# Patient Record
Sex: Male | Born: 1945 | Race: White | Hispanic: No | Marital: Married | Smoking: Former smoker
Health system: Southern US, Community
[De-identification: ages and names within clinical notes are randomized; demographics above are authoritative.]

## PROBLEM LIST (undated history)

## (undated) DIAGNOSIS — I255 Ischemic cardiomyopathy: Secondary | ICD-10-CM

## (undated) DIAGNOSIS — B399 Histoplasmosis, unspecified: Secondary | ICD-10-CM

## (undated) DIAGNOSIS — I350 Nonrheumatic aortic (valve) stenosis: Secondary | ICD-10-CM

## (undated) DIAGNOSIS — I2510 Atherosclerotic heart disease of native coronary artery without angina pectoris: Secondary | ICD-10-CM

## (undated) DIAGNOSIS — I509 Heart failure, unspecified: Secondary | ICD-10-CM

## (undated) DIAGNOSIS — G4733 Obstructive sleep apnea (adult) (pediatric): Secondary | ICD-10-CM

## (undated) DIAGNOSIS — I10 Essential (primary) hypertension: Secondary | ICD-10-CM

## (undated) DIAGNOSIS — E119 Type 2 diabetes mellitus without complications: Secondary | ICD-10-CM

## (undated) DIAGNOSIS — T7840XA Allergy, unspecified, initial encounter: Secondary | ICD-10-CM

## (undated) DIAGNOSIS — I219 Acute myocardial infarction, unspecified: Secondary | ICD-10-CM

## (undated) DIAGNOSIS — I77810 Thoracic aortic ectasia: Secondary | ICD-10-CM

## (undated) DIAGNOSIS — I214 Non-ST elevation (NSTEMI) myocardial infarction: Secondary | ICD-10-CM

## (undated) DIAGNOSIS — K7460 Unspecified cirrhosis of liver: Secondary | ICD-10-CM

## (undated) DIAGNOSIS — N186 End stage renal disease: Secondary | ICD-10-CM

## (undated) DIAGNOSIS — N400 Enlarged prostate without lower urinary tract symptoms: Secondary | ICD-10-CM

## (undated) DIAGNOSIS — I480 Paroxysmal atrial fibrillation: Secondary | ICD-10-CM

## (undated) DIAGNOSIS — H409 Unspecified glaucoma: Secondary | ICD-10-CM

## (undated) DIAGNOSIS — Z8249 Family history of ischemic heart disease and other diseases of the circulatory system: Secondary | ICD-10-CM

## (undated) DIAGNOSIS — N189 Chronic kidney disease, unspecified: Secondary | ICD-10-CM

## (undated) DIAGNOSIS — D126 Benign neoplasm of colon, unspecified: Secondary | ICD-10-CM

## (undated) DIAGNOSIS — I453 Trifascicular block: Secondary | ICD-10-CM

## (undated) DIAGNOSIS — M109 Gout, unspecified: Secondary | ICD-10-CM

## (undated) HISTORY — DX: Non-ST elevation (NSTEMI) myocardial infarction: I21.4

## (undated) HISTORY — DX: Chronic kidney disease, unspecified: N18.9

## (undated) HISTORY — DX: Unspecified cirrhosis of liver: K74.60

## (undated) HISTORY — DX: Benign neoplasm of colon, unspecified: D12.6

## (undated) HISTORY — DX: Type 2 diabetes mellitus without complications: E11.9

## (undated) HISTORY — DX: Benign prostatic hyperplasia without lower urinary tract symptoms: N40.0

## (undated) HISTORY — PX: COLONOSCOPY: SHX174

## (undated) HISTORY — DX: Unspecified glaucoma: H40.9

## (undated) HISTORY — DX: Allergy, unspecified, initial encounter: T78.40XA

## (undated) HISTORY — PX: CORONARY ANGIOPLASTY WITH STENT PLACEMENT: SHX49

## (undated) HISTORY — DX: Heart failure, unspecified: I50.9

## (undated) HISTORY — PX: APPENDECTOMY: SHX54

---
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
## 2005-06-26 ENCOUNTER — Ambulatory Visit: Payer: Self-pay | Admitting: Gastroenterology

## 2005-07-26 ENCOUNTER — Ambulatory Visit: Payer: Self-pay | Admitting: Gastroenterology

## 2005-07-26 ENCOUNTER — Encounter (INDEPENDENT_AMBULATORY_CARE_PROVIDER_SITE_OTHER): Payer: Self-pay | Admitting: Specialist

## 2008-06-27 ENCOUNTER — Ambulatory Visit: Payer: Self-pay | Admitting: Surgery

## 2008-07-14 ENCOUNTER — Ambulatory Visit (HOSPITAL_COMMUNITY): Admission: RE | Admit: 2008-07-14 | Discharge: 2008-07-14 | Payer: Self-pay | Admitting: Surgery

## 2008-07-14 ENCOUNTER — Ambulatory Visit: Payer: Self-pay | Admitting: Surgery

## 2008-07-25 ENCOUNTER — Ambulatory Visit: Payer: Self-pay | Admitting: Surgery

## 2008-08-05 ENCOUNTER — Ambulatory Visit (HOSPITAL_COMMUNITY): Admission: RE | Admit: 2008-08-05 | Discharge: 2008-08-05 | Payer: Self-pay | Admitting: Surgery

## 2008-09-19 ENCOUNTER — Ambulatory Visit: Payer: Self-pay | Admitting: Surgery

## 2008-10-21 ENCOUNTER — Encounter: Admission: RE | Admit: 2008-10-21 | Discharge: 2008-10-21 | Payer: Self-pay | Admitting: Internal Medicine

## 2008-10-27 ENCOUNTER — Telehealth (INDEPENDENT_AMBULATORY_CARE_PROVIDER_SITE_OTHER): Payer: Self-pay | Admitting: Radiology

## 2008-10-31 ENCOUNTER — Encounter: Payer: Self-pay | Admitting: Cardiology

## 2008-10-31 ENCOUNTER — Encounter: Payer: Self-pay | Admitting: Cardiovascular Disease

## 2008-10-31 ENCOUNTER — Ambulatory Visit: Payer: Self-pay

## 2009-02-15 ENCOUNTER — Ambulatory Visit (HOSPITAL_COMMUNITY): Admission: RE | Admit: 2009-02-15 | Discharge: 2009-02-15 | Payer: Self-pay | Admitting: Internal Medicine

## 2009-02-23 ENCOUNTER — Encounter (HOSPITAL_COMMUNITY): Admission: RE | Admit: 2009-02-23 | Discharge: 2009-05-24 | Payer: Self-pay | Admitting: Nephrology

## 2009-03-14 ENCOUNTER — Ambulatory Visit: Payer: Self-pay | Admitting: Cardiology

## 2009-03-14 ENCOUNTER — Ambulatory Visit (HOSPITAL_COMMUNITY): Admission: RE | Admit: 2009-03-14 | Discharge: 2009-03-15 | Payer: Self-pay | Admitting: General Surgery

## 2009-04-08 HISTORY — PX: PERITONEAL CATHETER INSERTION: SHX2223

## 2009-04-08 HISTORY — PX: AV FISTULA PLACEMENT: SHX1204

## 2009-04-13 ENCOUNTER — Emergency Department (HOSPITAL_COMMUNITY)
Admission: EM | Admit: 2009-04-13 | Discharge: 2009-04-13 | Payer: Self-pay | Source: Home / Self Care | Admitting: Emergency Medicine

## 2009-05-11 ENCOUNTER — Encounter: Admission: RE | Admit: 2009-05-11 | Discharge: 2009-05-11 | Payer: Self-pay | Admitting: Nephrology

## 2009-06-09 ENCOUNTER — Encounter: Admission: RE | Admit: 2009-06-09 | Discharge: 2009-06-09 | Payer: Self-pay | Admitting: Nephrology

## 2010-02-08 ENCOUNTER — Telehealth: Payer: Self-pay | Admitting: Cardiovascular Disease

## 2010-05-08 NOTE — Consult Note (Signed)
Summary: [facility]  [place]   Imported By: Marilynne Drivers 05/22/2009 16:04:44  _____________________________________________________________________  External Attachment:    Type:   Image     Comment:   External Document

## 2010-05-08 NOTE — Progress Notes (Signed)
Summary: Renal transplant clearance  Phone Note Other Incoming   Caller: Lannette Donath w/ [facility] Summary of Call: I had a message left on my voice mail today from [place] with [facility]. stating that they needed [place] to give a letter of clearance for Mr. Story to go on the renal transplant list. I called Lannette Donath and explained that he is technically not a cardiology pt. He was referred her by Dr. Melford Aase for a myoview in 7/10. Lannette Donath states she will make the pt aware. Initial call taken by: Alvis Lemmings, RN, BSN,  February 08, 2010 4:14 PM     Appended Document: Renal transplant clearance Priscilla's contact # is [phone].

## 2010-06-24 LAB — POCT I-STAT, CHEM 8
BUN: >140 mg/dL — ABNORMAL HIGH (ref 6–23)
Glucose, Bld: 46 mg/dL — ABNORMAL LOW (ref 70–99)
Potassium: 4.3 mEq/L (ref 3.5–5.1)
Sodium: 138 mEq/L (ref 135–145)
TCO2: 25 mmol/L (ref 0–100)

## 2010-07-09 LAB — FERRITIN: Ferritin: 179 ng/mL (ref 22–322)

## 2010-07-09 LAB — POCT HEMOGLOBIN-HEMACUE
Hemoglobin: 10.2 g/dL — ABNORMAL LOW (ref 13.0–17.0)
Hemoglobin: 11.8 g/dL — ABNORMAL LOW (ref 13.0–17.0)

## 2010-07-09 LAB — IRON AND TIBC
Iron: 54 ug/dL (ref 42–135)
TIBC: 388 ug/dL (ref 215–435)

## 2010-07-10 LAB — GLUCOSE, CAPILLARY: Glucose-Capillary: 135 mg/dL — ABNORMAL HIGH (ref 70–99)

## 2010-07-10 LAB — BASIC METABOLIC PANEL
BUN: 119 mg/dL — ABNORMAL HIGH (ref 6–23)
CO2: 20 mEq/L (ref 19–32)
CO2: 23 mEq/L (ref 19–32)
Calcium: 9.3 mg/dL (ref 8.4–10.5)
Chloride: 110 mEq/L (ref 96–112)
GFR calc Af Amer: 10 mL/min — ABNORMAL LOW (ref >60)
GFR calc non Af Amer: 8 mL/min — ABNORMAL LOW (ref >60)
Glucose, Bld: 183 mg/dL — ABNORMAL HIGH (ref 70–99)
Potassium: 4.4 mEq/L (ref 3.5–5.1)
Sodium: 139 mEq/L (ref 135–145)

## 2010-07-10 LAB — POCT I-STAT 4, (NA,K, GLUC, HGB,HCT)
Glucose, Bld: 110 mg/dL — ABNORMAL HIGH (ref 70–99)
Hemoglobin: 10.9 g/dL — ABNORMAL LOW (ref 13.0–17.0)

## 2010-07-10 LAB — DIFFERENTIAL
Eosinophils Relative: 2 % (ref 0–5)
Lymphocytes Relative: 15 % (ref 12–46)
Lymphs Abs: 0.9 10*3/uL (ref 0.7–4.0)
Monocytes Absolute: 0.4 10*3/uL (ref 0.1–1.0)
Neutrophils Relative %: 75 % (ref 43–77)

## 2010-07-10 LAB — CBC
HCT: 31.8 % — ABNORMAL LOW (ref 39.0–52.0)
MCHC: 33.8 g/dL (ref 30.0–36.0)
RBC: 3.25 MIL/uL — ABNORMAL LOW (ref 4.22–5.81)
WBC: 6.2 10*3/uL (ref 4.0–10.5)

## 2010-07-10 LAB — URINE MICROSCOPIC-ADD ON

## 2010-07-10 LAB — URINALYSIS, ROUTINE W REFLEX MICROSCOPIC
Bilirubin Urine: NEGATIVE
Hgb urine dipstick: NEGATIVE
Leukocytes, UA: NEGATIVE
Protein, ur: 100 mg/dL — AB

## 2010-07-11 LAB — POCT HEMOGLOBIN-HEMACUE: Hemoglobin: 9.2 g/dL — ABNORMAL LOW (ref 13.0–17.0)

## 2010-07-18 LAB — POCT I-STAT 4, (NA,K, GLUC, HGB,HCT)
Glucose, Bld: 139 mg/dL — ABNORMAL HIGH (ref 70–99)
Glucose, Bld: 177 mg/dL — ABNORMAL HIGH (ref 70–99)
HCT: 39 % (ref 39.0–52.0)
Hemoglobin: 13.3 g/dL (ref 13.0–17.0)
Potassium: 3.8 mEq/L (ref 3.5–5.1)
Sodium: 140 mEq/L (ref 135–145)

## 2010-07-18 LAB — GLUCOSE, CAPILLARY: Glucose-Capillary: 166 mg/dL — ABNORMAL HIGH (ref 70–99)

## 2010-08-21 NOTE — Assessment & Plan Note (Signed)
OFFICE VISIT   Robert Arnold, Robert Arnold  DOB:  10-02-1945                                       06/27/2008  [id number]   REASON FOR VISIT:  Dialysis access.   HISTORY:  This is a 65 year old gentleman with chronic kidney disease,  not yet on dialysis.  He is sent to me by Dr. Florene Glen.  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  He is right-  handed.   PAST MEDICAL HISTORY:  Significant for chronic kidney disease, diabetes,  hypertension, hypercholesterolemia.   REVIEW OF SYSTEMS:  GENERAL:  Negative for fevers, chills, weight gain,  weight loss.  CARDIAC:  Positive for shortness of breath with exertion.  PULMONARY:  Negative.  GI:  Negative.  GU:  Positive for kidney disease.  VASCULAR:  Negative.  NEURO:  Negative.  ORTHO:  Negative.  PSYCH:  Positive for depression.  ENT:  Negative.  HEME:  Negative.   FAMILY HISTORY:  Noncontributory.   SOCIAL HISTORY:  Married.  Works as a Tree surgeon.  Does not smoke.  Has a history of smoking but quit in 1992.   MEDICATIONS:  Allopurinol, Januvia, Crestor, Paxil, vitamin, Vitamin D,  Fish Oil, Lasix.   ALLERGIES:  None.   PHYSICAL EXAMINATION:  Vital Signs:  Blood pressure 159/106, pulse is  80.  General:  Well appearing, no distress.  Cardiovascular:  Regular  rate and rhythm.  Pulmonary:  Respirations are nonlabored.  Extremities:  Warm and well-perfused.  He has a palpable left radial pulse.  Cephalic  vein is visible.   DIAGNOSTIC STUDIES:  Vein mapping was performed today.  The patient has  adequate cephalic vein bilaterally, on the left ranging from 0.32 up to  0.61.   ASSESSMENT/PLAN:  Chronic kidney disease in a right-handed gentleman not  yet on dialysis.   PLAN:  The patient will be scheduled for a left wrist fistula.  The  risks and benefits, including non-maturity and steal syndrome, were  discussed with the patient.  His operation has been scheduled for  Thursday, July 14, 2008.   Eldridge Abrahams, MD  Electronically Signed   VWB/MEDQ  D:  06/27/2008  T:  06/28/2008  Job:  [id number]   cc:   Darrold Span. Florene Glen, M.D.

## 2010-08-21 NOTE — Procedures (Signed)
CEPHALIC VEIN MAPPING   INDICATION:  Cephalic vein mapping for dialysis access sites.   HISTORY:  End-stage renal disease.   EXAM:   The right cephalic vein is compressible.    Diameter measurements range from 0.87 to 0.30 cm.   The left cephalic vein is compressible.   Diameter measurements range from 0.46 to 0.32 cm.   See attached worksheet for all measurements.   IMPRESSION:  Patent bilateral cephalic veins which are of acceptable  diameter for use as a dialysis access site.   ___________________________________________  V. Leia Alf, MD   AC/MEDQ  D:  06/27/2008  T:  06/27/2008  Job:  [id number]

## 2010-08-21 NOTE — Op Note (Signed)
NAMEJIMM, SALK               ACCOUNT NO.:  [id number]   MEDICAL RECORD NO.:  [id number]          PATIENT TYPE:  AMB   LOCATION:  SDS                          FACILITY:  [facility]   PHYSICIAN:  Nelda Severe. Kellie Simmering, M.D.  DATE OF BIRTH:  25-Feb-1946   DATE OF PROCEDURE:  08/05/2008  DATE OF DISCHARGE:  08/05/2008                               OPERATIVE REPORT   PREOPERATIVE DIAGNOSIS:  End-stage renal disease with competing branch  of left forearm (Cimino) arteriovenous fistula.   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   OPERATIONS:  Ligation of competing branch of left forearm arteriovenous  fistula.   SURGEON:  Nelda Severe. Kellie Simmering, MD   FIRST ASSISTANT:  Nurse.   ANESTHESIA:  Local.   PROCEDURE:  The patient was taken to the operating room and placed in  the supine position at which time the left upper extremity was prepped  with Betadine scrub and solution and draped in a routine sterile manner.  Using ultrasound guidance, the left forearm fistula was examined and the  large cephalic vein had a communicating branch with the basilic system  about 4-5 cm distal to the antecubital area.  A short longitudinal  incision was made in this area after infiltration of 1% Xylocaine.  The  large branch was identified and after occluding the branch temporarily  it did improve flow slightly up the cephalic system into the upper arm.  This was ligated with 2-0 silk tie.  Further examination with ultrasound  revealed no further significant branches and the wound was closed in  layers with Vicryl in a subcuticular fashion.  Sterile dressing was  applied.  The patient was taken to the recovery room in satisfactory  condition.      Nelda Severe Kellie Simmering, M.D.  Electronically Signed     JDL/MEDQ  D:  08/05/2008  T:  08/05/2008  Job:  [id number]

## 2010-08-21 NOTE — Assessment & Plan Note (Signed)
OFFICE VISIT   Robert Arnold, Robert Arnold  DOB:  December 22, 1945                                       09/19/2008  [id number]   REASON FOR VISIT:  Follow up fistula.   HISTORY:  This is a 65 year old gentleman who underwent left  radiocephalic fistula on July 14, 2008.  When I saw him in follow-up, I  felt that he had some competing branches which were not allowing this  fistula to mature.  On August 05, 2008, he underwent branch ligation.  He  comes back today for follow-up.  On examination, he has an excellent  thrill in an easily palpable vein which I feel will serve as an adequate  conduit for dialysis should the need arise.  I am not going to schedule  him to come back to see me.  If there are any questions, I will ask the  renal service to contact me directly.   Eldridge Abrahams, MD  Electronically Signed   VWB/MEDQ  D:  09/18/2008  T:  09/20/2008  Job:  [id number]   cc:   Darrold Span. Florene Glen, M.D.

## 2010-08-21 NOTE — Procedures (Signed)
DIALYSIS GRAFT DUPLEX EVALUATION   INDICATION:  Recently placed left arm cephalic vein fistula which has  failed to mature in the last week and a half.   HISTORY:   DUPLEX:  See impression.                                   Duplex Velocities  Inflow artery                   140 cm/s  Inflow anastomosis              207 cm/s  Mid arterial limb  Mid graft  Mid venous limb  Outflow anastomosis             134 cm/s  Outflow vein                    105 cm/s   IMPRESSION:  The cephalic vein branches approximately 5 cm distal to the  antecubital fossa.  The main cephalic vein courses anteriorly, the  secondary branch courses medially and communicates with the basilic  vein.   ___________________________________________  V. Leia Alf, MD   MC/MEDQ  D:  07/25/2008  T:  07/25/2008  Job:  [id number]

## 2010-08-21 NOTE — Op Note (Signed)
NAMEGIONI, Robert Arnold               ACCOUNT NO.:  [id number]   MEDICAL RECORD NO.:  [id number]          PATIENT TYPE:  AMB   LOCATION:  SDS                          FACILITY:  [facility]   PHYSICIAN:  Theotis Burrow IV, MDDATE OF BIRTH:  1945/12/28   DATE OF PROCEDURE:  07/14/2008  DATE OF DISCHARGE:  07/14/2008                               OPERATIVE REPORT   PREOPERATIVE DIAGNOSIS:  Chronic kidney disease.   POSTOPERATIVE DIAGNOSIS:  Chronic kidney disease.   PROCEDURE PERFORMED:  Left radiocephalic fistula.   SURGEON:  1. Leia Alf, MD   ASSISTANT:  Jacinta Shoe, PA   ANESTHESIA:  General with LMA.   BLOOD LOSS:  Minimal.   FINDINGS:  Excellent vein.   INDICATIONS:  This is a 66 year old gentleman with chronic kidney  disease, not yet on dialysis.  He is right handed.  Vein mapping  revealed adequate cephalic vein in the left arm.  He comes in today for  left wrist fistula.   PROCEDURE:  The patient was identified in the holding area and taken to  room 8.  He was placed supine on the table.  General anesthesia was  administered, and the patient was prepped and draped in standard sterile  fashion.  A time-out was called.  Antibiotics were given.  Ultrasound  was used to find the course of the cephalic vein in the left arm.  It  was marked with an ink pen.  A transverse incision was made 2  fingerbreadths proximal to the radial head.  The vein was identified  within the incision and encircled with a vessel loop.  The cephalic vein  and the branch at this level.  The remainder of cephalic vein was  further dissected down towards the hand.  The branch was ligated with 2-  0 silk tie and clipped.  Once adequate length of the vein had been  dissected free, it was marked with ink pen for orientation.  Next, the  radial artery was identified.  Once it was identified, it was encircled  with a vessel loop and then mobilized proximally and distally.  The  radial artery  was approximately 2 mm in size.  Next, a right angle was  placed on the distal cephalic vein.  The vein was transected and the  distal end ligated with a 2-0 silk tie.  The vein was then flushed with  heparinized saline and easily distended.  It measured approximately 3.5  mm.  Next, the patient was given 3000 units of heparin.  After the  heparin had circulated, the radial artery was occluded with vascular  clamps.  The vein was then brought over towards the artery.  It had to  be shortened.  The end was then spatulated, and an end-to-side  anastomosis was created using a running 6-0 Prolene.  Prior to  completion of the anastomosis, the artery was flushed in antegrade and  retrograde fashion.  The anastomosis was then completed and the clamps  were released.  The fistula could be felt up to the level of the elbow.  There is a  good thrill.  I then inspected the vein as it coursed up the  arm through the incision.  There was no tension or narrowing from  compression of the subcutaneous  tissue on the vein.  The wound was then irrigated.  The deep tissue was  closed with a 3-0 Vicryl.  The skin was closed with 4-0 Vicryl.  Dermabond was placed.  The patient tolerated the procedure well.  There  were no complications.  He was successfully awakened from anesthesia and  taken to recovery room in stable condition.       Eldridge Abrahams, MD  Electronically Signed     VWB/MEDQ  D:  07/14/2008  T:  07/15/2008  Job:  [id number]

## 2010-08-21 NOTE — Assessment & Plan Note (Signed)
OFFICE VISIT   Robert Arnold, Robert Arnold  DOB:  09-Oct-1945                                       07/25/2008  [id number]   REASON FOR VISIT:  Follow-up left wrist fistula.   HISTORY:  This is a 65 year old gentleman with chronic kidney disease,  not yet on dialysis, who underwent a left radiocephalic fistula on  [id number].  He comes back in today for follow-up.   On examination, he has an easily palpable fistula up to the mid forearm  where there is a caliper change.  I had this evaluated with ultrasound  and it is about this point in time he has a large branch.  In fact,  there appeared to be two branches.  These appear to be significant and,  therefore, I think he would benefit from ligation of the branch.   The patient will be scheduled for ligation of his branch.  I will plan  on ligating the branch that appears to communicate with the basilic  system.  These branches occur approximately 5 cm distal to the  antecubital crease.  In the operating room, I will map the course of the  cephalic vein from proximal to distal to ensure that the appropriate  branch is marked and ligated.   Eldridge Abrahams, MD  Electronically Signed   VWB/MEDQ  D:  07/25/2008  T:  07/26/2008  Job:  [id number]   cc:   Darrold Span. Florene Glen, M.D.

## 2010-11-07 ENCOUNTER — Other Ambulatory Visit: Payer: Self-pay | Admitting: *Deleted

## 2010-11-07 DIAGNOSIS — Q619 Cystic kidney disease, unspecified: Secondary | ICD-10-CM

## 2010-11-12 ENCOUNTER — Other Ambulatory Visit: Payer: Self-pay

## 2010-11-29 ENCOUNTER — Encounter: Payer: Self-pay | Admitting: Gastroenterology

## 2010-12-03 ENCOUNTER — Other Ambulatory Visit: Payer: Self-pay | Admitting: *Deleted

## 2010-12-03 DIAGNOSIS — N281 Cyst of kidney, acquired: Secondary | ICD-10-CM

## 2010-12-11 ENCOUNTER — Ambulatory Visit
Admission: RE | Admit: 2010-12-11 | Discharge: 2010-12-11 | Disposition: A | Discharge status: Home / Self Care | Payer: BC Managed Care – PPO | Source: Ambulatory Visit | Attending: *Deleted | Admitting: *Deleted

## 2010-12-11 DIAGNOSIS — N281 Cyst of kidney, acquired: Secondary | ICD-10-CM

## 2010-12-11 MED ORDER — IOHEXOL 300 MG/ML  SOLN
100.0000 mL | Freq: Once | INTRAMUSCULAR | Status: AC | PRN
  Administered 2010-12-11: 100 mL via INTRAVENOUS

## 2010-12-19 ENCOUNTER — Ambulatory Visit (AMBULATORY_SURGERY_CENTER): Payer: BC Managed Care – PPO | Admitting: *Deleted

## 2010-12-19 VITALS — Ht 68.0 in | Wt 219.0 lb

## 2010-12-19 DIAGNOSIS — Z1211 Encounter for screening for malignant neoplasm of colon: Secondary | ICD-10-CM

## 2010-12-19 MED ORDER — PEG-KCL-NACL-NASULF-NA ASC-C 100 G PO SOLR: ORAL | Status: DC

## 2010-12-19 NOTE — Progress Notes (Signed)
Has left fore arm AV fistula and Peritoneal fistula  On dialysis

## 2011-01-01 ENCOUNTER — Ambulatory Visit (AMBULATORY_SURGERY_CENTER): Payer: Medicare Other | Admitting: Gastroenterology

## 2011-01-01 ENCOUNTER — Encounter: Payer: Self-pay | Admitting: Gastroenterology

## 2011-01-01 VITALS — BP 150/78 | HR 65 | Temp 97.8°F | Resp 14 | Ht 68.0 in | Wt 219.0 lb

## 2011-01-01 DIAGNOSIS — D126 Benign neoplasm of colon, unspecified: Secondary | ICD-10-CM

## 2011-01-01 DIAGNOSIS — Z1211 Encounter for screening for malignant neoplasm of colon: Secondary | ICD-10-CM

## 2011-01-01 DIAGNOSIS — K5730 Diverticulosis of large intestine without perforation or abscess without bleeding: Secondary | ICD-10-CM

## 2011-01-01 LAB — GLUCOSE, CAPILLARY: Glucose-Capillary: 84 mg/dL (ref 70–99)

## 2011-01-01 MED ORDER — SODIUM CHLORIDE 0.9 % IV SOLN: 500.0000 mL | INTRAVENOUS | Status: DC

## 2011-01-01 NOTE — Patient Instructions (Addendum)
Diverticulosis Diverticulosis is a common condition that develops when small pouches (diverticula) form in the wall of the colon. The risk of diverticulosis increases with age. It happens more often in people who eat a low-fiber diet. Most individuals with diverticulosis have no symptoms. Those individuals with symptoms usually experience belly (abdominal) pain, constipation, or loose stools (diarrhea). HOME CARE INSTRUCTIONS  Increase the amount of fiber in your diet as directed by your caregiver or dietician. This may reduce symptoms of diverticulosis.   Your caregiver may recommend taking a dietary fiber supplement.   Drink at least 6 to 8 glasses of water each day to prevent constipation.   Try not to strain when you have a bowel movement.   Your caregiver may recommend avoiding nuts and seeds to prevent complications, although this is still an uncertain benefit.   Only take over-the-counter or prescription medicines for pain, discomfort, or fever as directed by your caregiver.  FOODS HAVING HIGH FIBER CONTENT INCLUDE:  Fruits. Apple, peach, pear, tangerine, raisins, prunes.   Vegetables. Brussels sprouts, asparagus, broccoli, cabbage, carrot, cauliflower, romaine lettuce, spinach, summer squash, tomato, winter squash, zucchini.   Starchy Vegetables. Baked beans, kidney beans, lima beans, split peas, lentils, potatoes (with skin).   Grains. Whole wheat bread, brown rice, bran flake cereal, plain oatmeal, white rice, shredded wheat, bran muffins.  SEEK IMMEDIATE MEDICAL CARE IF:  You develop increasing pain or severe bloating.  You have an oral temperature above 100  Polyps, Colon  A polyp is extra tissue that grows inside your body. Colon polyps grow in the large intestine. The large intestine, also called the colon, is part of your digestive system. It is a long, hollow tube at the end of your digestive tract where your body makes and stores stool. Most polyps are not dangerous.  They are benign. This means they are not cancerous. But over time, some types of polyps can turn into cancer. Polyps that are smaller than a pea are usually not harmful. But larger polyps could someday become or may already be cancerous. To be safe, doctors remove all polyps and test them.  WHO GETS POLYPS? Anyone can get polyps, but certain people are more likely than others. You may have a greater chance of getting polyps if: You are over 50.  You have had polyps before.  Someone in your family has had polyps.  Someone in your family has had cancer of the large intestine.  Find out if someone in your family has had polyps. You may also be more likely to get polyps if you:  Eat a lot of fatty foods  Smoke  Drink alcohol  Do not exercise Eat too much  SYMPTOMS Most small polyps do not cause symptoms. People often do not know they have one until their caregiver finds it during a regular checkup or while testing them for something else. Some people do have symptoms like these: Bleeding from the anus. You might notice blood on your underwear or on toilet paper after you have had a bowel movement.  Constipation or diarrhea that lasts more than a week.  Blood in the stool. Blood can make stool look black or it can show up as red streaks in the stool.  If you have any of these symptoms, see your caregiver. HOW DOES THE DOCTOR TEST FOR POLYPS? The doctor can use four tests to check for polyps: Digital rectal exam. The caregiver wears gloves and checks your rectum (the last part of the large intestine)  to see if it feels normal. This test would find polyps only in the rectum. Your caregiver may need to do one of the other tests listed below to find polyps higher up in the intestine.  Barium enema. The caregiver puts a liquid called barium into your rectum before taking x-rays of your large intestine. Barium makes your intestine look white in the pictures. Polyps are dark, so they are easy to see.    Sigmoidoscopy. With this test, the caregiver can see inside your large intestine. A thin flexible tube is placed into your rectum. The device is called a sigmoidoscope, which has a light and a tiny video camera in it. The caregiver uses the sigmoidoscope to look at the last third of your large intestine.  Colonoscopy. This test is like sigmoidoscopy, but the caregiver looks at all of the large intestine. It usually requires sedation. This is the most common method for finding and removing polyps.  TREATMENT The caregiver will remove the polyp during sigmoidoscopy or colonoscopy. The polyp is then tested for cancer.  If you have had polyps, your caregiver may want you to get tested regularly in the future.  PREVENTION There is not one sure way to prevent polyps. You might be able to lower your risk of getting them if you: Eat more fruits and vegetables and less fatty food.  Do not smoke.  Avoid alcohol.  Exercise every day.  Lose weight if you are overweight.  Eating more calcium and folate can also lower your risk of getting polyps. Some foods that are rich in calcium are milk, cheese, and broccoli. Some foods that are rich in folate are chickpeas, kidney beans, and spinach.  Aspirin might help prevent polyps. Studies are under way.  Document Released: 12/20/2003 Document Re-Released: 09/12/2009  [facility] Patient Information 2011 [place]., not controlled by medicine.   You develop vomiting or bowel movements that are bloody or black.  Document Released: 12/21/2003 Document Re-Released: 09/12/2009 [facility] Patient Information 2011 [place].  Please review your discharge instructions (blue and green sheets)  Resume your normal medications  Review information about polyps, diverticulosis and high fiber diets  Your blood sugar in the recovery room was 134

## 2011-01-01 NOTE — Progress Notes (Signed)
Pt tolerated the colonoscopy very well. maw 

## 2011-01-02 ENCOUNTER — Telehealth: Payer: Self-pay | Admitting: *Deleted

## 2011-01-02 NOTE — Telephone Encounter (Signed)
No answer. Message left to call if needed.

## 2011-04-09 HISTORY — PX: CORONARY ARTERY BYPASS GRAFT: SHX141

## 2011-07-09 ENCOUNTER — Ambulatory Visit (HOSPITAL_BASED_OUTPATIENT_CLINIC_OR_DEPARTMENT_OTHER): Payer: BC Managed Care – PPO | Attending: Internal Medicine

## 2011-07-09 VITALS — Ht 68.0 in | Wt 204.0 lb

## 2011-07-09 DIAGNOSIS — G4733 Obstructive sleep apnea (adult) (pediatric): Secondary | ICD-10-CM | POA: Insufficient documentation

## 2011-07-20 DIAGNOSIS — G4733 Obstructive sleep apnea (adult) (pediatric): Secondary | ICD-10-CM

## 2011-07-20 NOTE — Procedures (Signed)
[name], Robert Arnold               ACCOUNT NO.:  [id number]  MEDICAL RECORD NO.:  [id number]          PATIENT TYPE:  OUT  LOCATION:  SLEEP CENTER                 FACILITY:  [facility]  PHYSICIAN:  Jae Skeet D. Annamaria Boots, MD, FCCP, FACPDATE OF BIRTH:  01/[birth year]  DATE OF STUDY:  07/09/2011                           NOCTURNAL POLYSOMNOGRAM  REFERRING PHYSICIAN:  [name], M.D.  REFERRING PHYSICIAN:  [name], MD  INDICATION FOR STUDY:  Insomnia with sleep apnea.  EPWORTH SLEEPINESS SCORE:  20/24.  BMI 31, weight 204 pounds, height 68 inches, neck 17.5 inches.  MEDICATIONS:  Home medications are charted and reviewed.  SLEEP ARCHITECTURE:  Total sleep time 62 minutes, sleep efficiency 16.1%.  Stage I was 25.8%, stage II 74.2%, stage III and REM were absent.  Sleep latency 57 minutes, REM latency NA, awake after sleep onset 254 minutes, arousal index 24.2.  Bedtime medication:  None.  Sleep was recorded between 2300-2345 p.m. and then again from 0300-0430 a.m.  RESPIRATORY DATA:  Apnea-hypopnea index (AHI) 79.4 per hour.  A total of 82 events was scored including 2 obstructive apneas and 80 hypopneas. All events were associated with non-supine sleep position.  There was no REM.  Because of lack of sleep, he did not meet the requirements for CPAP split titration protocol on this study night.  OXYGEN DATA:  Moderately loud snoring with oxygen desaturation to a nadir of 85% and a mean oxygen saturation through the study of 93.6% on room air.  CARDIAC DATA:  Normal sinus rhythm.  MOVEMENT-PARASOMNIA:  No significant movement disturbance.  No bathroom trips.  IMPRESSIONS-RECOMMENDATION: 1. Severe difficulty initiating and maintaining sleep, insomnia, on     the study night.  Pattern was consistent with the patient's home     description.  No bedtime medication was taken. 2. Severe obstructive sleep apnea/hypopnea syndrome, apnea/hypopnea     index 79.4 per hour.   Moderately loud snoring with oxygen     desaturation to a nadir of 85% on room air. 3. Respiratory events were noted during those limited periods of time     when he was asleep.  There was insufficient time to permit     application of split CPAP titration protocol on this study night.     Consider management first for the insomnia problem.  Consider     return with control of the insomnia for dedicated CPAP titration     study if clinically appropriate.     Jaydi Bray D. Annamaria Boots, MD, [facility], [place], [place] Board of Sleep Medicine    CDY/MEDQ  D:  07/20/2011 08:52:29  T:  07/20/2011 09:10:07  Job:  [id number]

## 2011-09-25 HISTORY — PX: KIDNEY TRANSPLANT: SHX239

## 2011-09-26 DIAGNOSIS — I219 Acute myocardial infarction, unspecified: Secondary | ICD-10-CM

## 2011-09-26 HISTORY — DX: Acute myocardial infarction, unspecified: I21.9

## 2011-10-22 ENCOUNTER — Ambulatory Visit: Payer: BC Managed Care – PPO | Admitting: Occupational Therapy

## 2011-10-22 ENCOUNTER — Ambulatory Visit: Payer: BC Managed Care – PPO | Admitting: Physical Therapy

## 2011-10-28 ENCOUNTER — Ambulatory Visit: Payer: BC Managed Care – PPO | Admitting: Physical Therapy

## 2011-10-28 ENCOUNTER — Encounter: Payer: BC Managed Care – PPO | Admitting: Occupational Therapy

## 2011-10-30 ENCOUNTER — Ambulatory Visit: Payer: Medicare Other | Attending: Physical Medicine and Rehabilitation | Admitting: Physical Therapy

## 2011-10-30 DIAGNOSIS — Z5189 Encounter for other specified aftercare: Secondary | ICD-10-CM | POA: Insufficient documentation

## 2011-10-30 DIAGNOSIS — I252 Old myocardial infarction: Secondary | ICD-10-CM | POA: Insufficient documentation

## 2011-10-30 DIAGNOSIS — R5381 Other malaise: Secondary | ICD-10-CM | POA: Insufficient documentation

## 2011-10-30 DIAGNOSIS — R4189 Other symptoms and signs involving cognitive functions and awareness: Secondary | ICD-10-CM | POA: Insufficient documentation

## 2011-10-30 DIAGNOSIS — M6281 Muscle weakness (generalized): Secondary | ICD-10-CM | POA: Insufficient documentation

## 2011-10-30 DIAGNOSIS — R269 Unspecified abnormalities of gait and mobility: Secondary | ICD-10-CM | POA: Insufficient documentation

## 2011-10-30 DIAGNOSIS — R279 Unspecified lack of coordination: Secondary | ICD-10-CM | POA: Insufficient documentation

## 2011-11-06 ENCOUNTER — Ambulatory Visit: Payer: Medicare Other | Admitting: Physical Therapy

## 2011-11-06 ENCOUNTER — Encounter (HOSPITAL_COMMUNITY): Payer: BC Managed Care – PPO

## 2011-11-06 ENCOUNTER — Ambulatory Visit: Payer: Medicare Other | Admitting: Occupational Therapy

## 2011-11-08 ENCOUNTER — Ambulatory Visit: Payer: Medicare Other | Attending: Physical Medicine and Rehabilitation | Admitting: Physical Therapy

## 2011-11-08 ENCOUNTER — Ambulatory Visit: Payer: BC Managed Care – PPO | Admitting: Physical Therapy

## 2011-11-08 ENCOUNTER — Ambulatory Visit: Payer: Medicare Other | Admitting: Occupational Therapy

## 2011-11-08 DIAGNOSIS — R4189 Other symptoms and signs involving cognitive functions and awareness: Secondary | ICD-10-CM | POA: Insufficient documentation

## 2011-11-08 DIAGNOSIS — R269 Unspecified abnormalities of gait and mobility: Secondary | ICD-10-CM | POA: Insufficient documentation

## 2011-11-08 DIAGNOSIS — R5381 Other malaise: Secondary | ICD-10-CM | POA: Insufficient documentation

## 2011-11-08 DIAGNOSIS — R279 Unspecified lack of coordination: Secondary | ICD-10-CM | POA: Insufficient documentation

## 2011-11-08 DIAGNOSIS — I252 Old myocardial infarction: Secondary | ICD-10-CM | POA: Insufficient documentation

## 2011-11-08 DIAGNOSIS — Z5189 Encounter for other specified aftercare: Secondary | ICD-10-CM | POA: Insufficient documentation

## 2011-11-08 DIAGNOSIS — M6281 Muscle weakness (generalized): Secondary | ICD-10-CM | POA: Insufficient documentation

## 2011-11-11 ENCOUNTER — Ambulatory Visit: Payer: BC Managed Care – PPO | Admitting: Physical Therapy

## 2011-11-12 ENCOUNTER — Ambulatory Visit: Payer: Medicare Other | Admitting: Physical Therapy

## 2011-11-12 ENCOUNTER — Ambulatory Visit: Payer: Medicare Other | Admitting: Occupational Therapy

## 2011-11-13 ENCOUNTER — Ambulatory Visit: Payer: BC Managed Care – PPO | Admitting: Physical Therapy

## 2011-11-18 ENCOUNTER — Ambulatory Visit: Payer: Medicare Other | Admitting: Occupational Therapy

## 2011-11-18 ENCOUNTER — Ambulatory Visit: Payer: Medicare Other | Admitting: Physical Therapy

## 2011-11-20 ENCOUNTER — Ambulatory Visit: Payer: Medicare Other | Admitting: *Deleted

## 2011-11-20 ENCOUNTER — Ambulatory Visit: Payer: Medicare Other | Admitting: Physical Therapy

## 2011-11-25 ENCOUNTER — Ambulatory Visit: Payer: Medicare Other | Admitting: Physical Therapy

## 2011-11-25 ENCOUNTER — Ambulatory Visit: Payer: Medicare Other | Admitting: Occupational Therapy

## 2011-11-26 ENCOUNTER — Encounter (HOSPITAL_COMMUNITY): Payer: BC Managed Care – PPO

## 2011-11-27 ENCOUNTER — Ambulatory Visit: Payer: Medicare Other | Admitting: Occupational Therapy

## 2011-11-27 ENCOUNTER — Ambulatory Visit: Payer: Medicare Other | Admitting: Physical Therapy

## 2011-11-27 ENCOUNTER — Ambulatory Visit: Payer: BC Managed Care – PPO | Admitting: Physical Therapy

## 2011-11-29 ENCOUNTER — Encounter (HOSPITAL_COMMUNITY): Payer: Self-pay

## 2011-11-29 ENCOUNTER — Ambulatory Visit (HOSPITAL_COMMUNITY)
Admission: RE | Admit: 2011-11-29 | Discharge: 2011-11-29 | Disposition: A | Discharge status: Home / Self Care | Payer: Medicare Other | Source: Ambulatory Visit | Attending: Internal Medicine | Admitting: Internal Medicine

## 2011-11-29 VITALS — BP 119/75 | HR 73 | Ht 68.0 in | Wt 180.0 lb

## 2011-11-29 DIAGNOSIS — I5022 Chronic systolic (congestive) heart failure: Secondary | ICD-10-CM | POA: Insufficient documentation

## 2011-11-29 DIAGNOSIS — I509 Heart failure, unspecified: Secondary | ICD-10-CM

## 2011-11-29 DIAGNOSIS — I2510 Atherosclerotic heart disease of native coronary artery without angina pectoris: Secondary | ICD-10-CM | POA: Insufficient documentation

## 2011-11-29 DIAGNOSIS — I4891 Unspecified atrial fibrillation: Secondary | ICD-10-CM

## 2011-11-29 HISTORY — DX: Histoplasmosis, unspecified: B39.9

## 2011-11-29 HISTORY — DX: Obstructive sleep apnea (adult) (pediatric): G47.33

## 2011-11-29 HISTORY — DX: Gout, unspecified: M10.9

## 2011-11-29 HISTORY — DX: Paroxysmal atrial fibrillation: I48.0

## 2011-11-29 HISTORY — DX: Atherosclerotic heart disease of native coronary artery without angina pectoris: I25.10

## 2011-11-29 NOTE — Patient Instructions (Addendum)
Your physician has requested that you have an echocardiogram. Echocardiography is a painless test that uses sound waves to create images of your heart. It provides your doctor with information about the size and shape of your heart and how well your heart's chambers and valves are working. This procedure takes approximately one hour. There are no restrictions for this procedure.  IN 1 MONTH  Your physician recommends that you schedule a follow-up appointment in: 1 month

## 2011-11-29 NOTE — Progress Notes (Signed)
Advanced Heart Failure Team History and Physical Note   Primary Physician: Dr. Melford Aase Primary Cardiologist:  None Nephrologist: Dr. 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  Underwent living-related kidney transplant at [facility] on 09/25/2011. Post day #1 had a NSTEMI. Cath revealed multivessel disease (details not available) . Underwent stenting of LAD (he thinks DES). Developed AF and required DC-CV.  Treated with coumadin for about a week and then stopped due to ureteral bleeding which required a stent. Echo showed EF 20% with global HK.  Transferred to rehab for 11 days. D/c'd July 11. Going to Neuro rehab on outpatient basis for balance/stamina issues.   Referred here to establish cardiology care. Feeling stronger. Seems like balance limits him most. Can walk 4 or 5 aisles in the store before stopping. No swelling. No CP, orthopnea or PND.   BP runs low. Yesterday it was 78/40 and meds decreased at [facility].     Review of Systems: [y] = yes, [ ]  = no   General: Weight gain [ ] ; Weight loss [ ] ; Anorexia [ ] ; Fatigue Blue.Reese ]; Fever [ ] ; Chills [ ] ; Weakness [ ]   Cardiac: Chest pain/pressure [ ] ; Resting SOB [ ] ; Exertional SOB [ ] ; Orthopnea [ ] ; Pedal Edema [ ] ; Palpitations [ ] ; Syncope [ ] ; Presyncope [ ] ; Paroxysmal nocturnal dyspnea[ ]   Pulmonary: Cough [ ] ; Wheezing[ ] ; Hemoptysis[ ] ; Sputum [ ] ; Snoring Blue.Reese ]  GI: Vomiting[ ] ; Dysphagia[ ] ; Melena[ ] ; Hematochezia [ ] ; Heartburn[ ] ; Abdominal pain [ ] ; Constipation [ ] ; Diarrhea [ ] ; BRBPR [ ]   GU: Hematuria[ ] ; Dysuria [ ] ; Nocturia[ ]   Vascular: Pain in legs with walking [ ] ; Pain in feet with lying flat [ ] ; Non-healing sores [ ] ; Stroke [ ] ; TIA [ ] ; Slurred speech [ ] ;  Neuro: Headaches[ ] ; Vertigo[ ] ; Seizures[ ] ; Paresthesias[ ] ;Blurred vision [  ]; Diplopia [ ] ; Vision changes [ ]  Imbalance [y] Ortho/Skin: Arthritis [ y]; Joint pain [ y]; Muscle pain [ ] ; Joint swelling [ ] ; Back Pain [ ] ; Rash [ ]   Psych: Depression[ ] ; Anxiety[ ]   Heme: Bleeding problems [ ] ; Clotting disorders [ ] ; Anemia [ ]   Endocrine: Diabetes Blue.Reese ]; Thyroid dysfunction[ ]   Home Medications Prior to Admission medications   Medication Sig Start Date End Date Taking? Authorizing Provider  buPROPion (WELLBUTRIN XL) 300 MG 24 hr tablet Take 300 mg by mouth daily.      Historical Provider, MD  carvedilol (COREG) 3.125 MG tablet  11/28/11   Historical Provider, MD  CITALOPRAM HYDROBROMIDE PO Take 4 mg by mouth daily.      Historical Provider, MD  clopidogrel (PLAVIX) 75 MG tablet  11/08/11   Historical Provider, MD  fenofibrate micronized (LOFIBRA) 134 MG capsule Take 134 mg by mouth daily.      Historical Provider, MD  ferrous gluconate (FERGON) 325 MG tablet Take 325 mg by mouth 2 (two) times daily.      Historical Provider, MD  finasteride (PROSCAR) 5 MG tablet Take 5 mg by mouth every other day.      Historical Provider, MD  FREESTYLE TEST STRIPS test strip  12/10/10   Historical Provider, MD  glyBURIDE (DIABETA) 1.25 MG tablet Take 0.625 mg by mouth 2 (two) times daily with a meal.  Historical Provider, MD  HYDROcodone-acetaminophen (NORCO/VICODIN) 5-325 MG per tablet  09/24/11   Historical Provider, MD  itraconazole (SPORANOX) 100 MG capsule  11/07/11   Historical Provider, MD  LORazepam (ATIVAN) 2 MG tablet  09/06/11   Historical Provider, MD  losartan (COZAAR) 25 MG tablet  10/16/11   Historical Provider, MD  metFORMIN (GLUCOPHAGE) 500 MG tablet  11/08/11   Historical Provider, MD  multivitamin (RENA-VIT) TABS tablet Take 1 tablet by mouth daily.      Historical Provider, MD  MYFORTIC 360 MG TBEC  10/31/11   Historical Provider, MD  nystatin (MYCOSTATIN) 100000 UNIT/ML suspension  10/31/11   Historical Provider, MD  omeprazole (PRILOSEC) 20 MG capsule  09/27/11    Historical Provider, MD  peg 3350 powder (MOVIPREP) 100 G SOLR MOVI PREP take as directed 12/19/10   Inda Castle, MD  PHOSPHA 250 NEUTRAL [phone] MG tablet  10/31/11   Historical Provider, MD  PROGRAF 0.5 MG capsule  11/01/11   Historical Provider, MD  rosuvastatin (CRESTOR) 40 MG tablet Take 20 mg by mouth daily.      Historical Provider, MD  sevelamer (RENVELA) 800 MG tablet Take 2,400 mg by mouth 2 (two) times daily with a meal.      Historical Provider, MD  sitaGLIPtin (JANUVIA) 100 MG tablet Take 50 mg by mouth daily.      Historical Provider, MD  sulfamethoxazole-trimethoprim (BACTRIM,SEPTRA) 400-80 MG per tablet  10/31/11   Historical Provider, MD  Tamsulosin HCl (FLOMAX) 0.4 MG CAPS Take 0.4 mg by mouth at bedtime.      Historical Provider, MD  TRAVATAN Z 0.004 % SOLN ophthalmic solution  10/30/11   Historical Provider, MD  VALCYTE 450 MG tablet  10/31/11   Historical Provider, MD    Past Medical History: Past Medical History  Diagnosis Date  . 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 Iron deficiency anemia, unspecified   . Diabetes mellitus type 2, controlled   . Hyperlipidemia   . BPH (benign prostatic hyperplasia)   . Gout   . CAD (coronary artery disease)     s/p post-op NSTEMI. treated with stent to LAD. at [facility]  . PAF (paroxysmal atrial fibrillation)     s/p DC-CV in 6/13. Off coumadin due to ureteral bleed  . OSA (obstructive sleep apnea)   . 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 HTN (hypertension)     Past Surgical History: Past Surgical History  Procedure Date  . Colonoscopy   . Peritoneal catheter insertion 2011  . Av fistula placement 2011    Left forearm    Family History: Family History  Problem Relation Age of Onset  . Heart attack Father   . Diabetes Mother   . COPD Mother     Social History: History   Social History  . Marital Status: Married    Spouse Name:  N/A    Number of Children: N/A  . Years of Education: N/A   Social History Main Topics  . Smoking status: Former Smoker    Quit date: 11/29/2006  . Smokeless tobacco: Never Used  . Alcohol Use: Yes     1-2 a month  . Drug Use: No  . Sexually Active: Not on file   Other Topics Concern  . Not on file   Social History Narrative  . No narrative on file    Allergies:  No Known Allergies  Objective:    Vital Signs:   Pulse Rate:  [73] 73  (08/23 1004) BP: (119)/(75) 119/75 mmHg (08/23 1004) SpO2:  [99 %] 99 % (08/23 1004) Weight:  [180 lb (81.647 kg)] 180 lb (81.647 kg) (08/23 1004)   Filed Weights   11/29/11 1004  Weight: 180 lb (81.647 kg)    Physical Exam: General:  Well appearing. No resp difficulty HEENT: normal Neck: supple. JVP . Carotids 2+ bilat; no bruits. No lymphadenopathy or thryomegaly appreciated. Cor: PMI nondisplaced. Regular rate & rhythm. No rubs, gallops or murmurs. Lungs: clear Abdomen: soft, nontender, nondistended. No hepatosplenomegaly. No bruits or masses. Good bowel sounds. Extremities: no cyanosis, clubbing, rash, edema Neuro: alert & orientedx3, cranial nerves grossly intact. moves all 4 extremities w/o difficulty. Affect pleasant  ECG: SR 71 with 1AVB (224ms) Mild anterolateral ST depression.   Assessment/Plan:

## 2011-11-30 DIAGNOSIS — I5042 Chronic combined systolic (congestive) and diastolic (congestive) heart failure: Secondary | ICD-10-CM | POA: Insufficient documentation

## 2011-11-30 DIAGNOSIS — I5022 Chronic systolic (congestive) heart failure: Secondary | ICD-10-CM | POA: Insufficient documentation

## 2011-11-30 NOTE — Assessment & Plan Note (Signed)
Appears to be post-op AF. Now back in SR. Continue asa/plavix. No coumadin at this time. If recurs will need to re-evaluate.

## 2011-11-30 NOTE — Assessment & Plan Note (Signed)
No evidence of ischemia. Continue current regimen. Have asked him to bring his stent card to see if this is DES or not to help determine length of therapy with Plavix. Once balance improves will switch from Neuro rehab and enroll in cardiac rehab to continue to build his stamina.

## 2011-11-30 NOTE — Assessment & Plan Note (Signed)
Doing well from an HF standpoint despite EF 20%. I suspect that this is primarily critical illness (nonischemic) CM and that EF will recover quickly. BP too low to tolerate further titration of ACE-I and b-blocker. Volume status looks good. Reinforced need for daily weights and reviewed use of sliding scale diuretics. Will check echo.

## 2011-12-01 NOTE — Addendum Note (Signed)
Encounter addended by: Ladoris Gene on: 12/01/2011 10:56 AM<BR>     Documentation filed: Charges VN

## 2011-12-02 ENCOUNTER — Ambulatory Visit: Payer: Medicare Other | Admitting: Occupational Therapy

## 2011-12-02 ENCOUNTER — Ambulatory Visit: Payer: Medicare Other | Admitting: Physical Therapy

## 2011-12-04 ENCOUNTER — Ambulatory Visit: Payer: Medicare Other | Admitting: Occupational Therapy

## 2011-12-04 ENCOUNTER — Ambulatory Visit: Payer: Medicare Other | Admitting: Physical Therapy

## 2011-12-10 ENCOUNTER — Ambulatory Visit: Payer: Medicare Other | Admitting: Occupational Therapy

## 2011-12-10 ENCOUNTER — Ambulatory Visit: Payer: Medicare Other | Attending: Physical Medicine and Rehabilitation | Admitting: Physical Therapy

## 2011-12-10 ENCOUNTER — Telehealth (HOSPITAL_COMMUNITY): Payer: Self-pay | Admitting: Cardiology

## 2011-12-10 NOTE — Telephone Encounter (Signed)
Needs a letter sent to his HR department as [place] said pt is not able to return to work.  Please send letter to Haynes Hoehn [phone]

## 2011-12-10 NOTE — Telephone Encounter (Signed)
Will send to Dr Haroldine Laws to compose letter

## 2011-12-13 ENCOUNTER — Ambulatory Visit: Payer: Medicare Other | Admitting: Occupational Therapy

## 2011-12-13 ENCOUNTER — Ambulatory Visit: Payer: Medicare Other | Admitting: Physical Therapy

## 2011-12-16 ENCOUNTER — Ambulatory Visit: Payer: Medicare Other | Admitting: Occupational Therapy

## 2011-12-16 ENCOUNTER — Ambulatory Visit: Payer: Medicare Other | Admitting: Physical Therapy

## 2011-12-16 NOTE — Telephone Encounter (Signed)
Pt called again to ask if letter could be completed before the end of the week. If the letter is not written he will have to return to work 12/24/11. If letter is written, can the date be extended to 01/27/12, and Part Time a few days per week after this day

## 2011-12-18 ENCOUNTER — Ambulatory Visit: Payer: Medicare Other | Admitting: Physical Therapy

## 2011-12-18 ENCOUNTER — Ambulatory Visit: Payer: Medicare Other | Admitting: Occupational Therapy

## 2011-12-20 ENCOUNTER — Encounter: Payer: Self-pay | Admitting: Internal Medicine

## 2011-12-20 NOTE — Telephone Encounter (Signed)
Letter completed by Dr Haroldine Laws and faxed, unable to reach pt to let him know

## 2011-12-24 ENCOUNTER — Ambulatory Visit: Payer: Medicare Other | Admitting: Occupational Therapy

## 2011-12-24 ENCOUNTER — Ambulatory Visit: Payer: Medicare Other | Admitting: Physical Therapy

## 2011-12-25 ENCOUNTER — Ambulatory Visit: Payer: Medicare Other | Admitting: Occupational Therapy

## 2011-12-25 ENCOUNTER — Ambulatory Visit: Payer: Medicare Other | Admitting: *Deleted

## 2011-12-26 ENCOUNTER — Telehealth (HOSPITAL_COMMUNITY): Payer: Self-pay | Admitting: Cardiology

## 2011-12-26 NOTE — Telephone Encounter (Signed)
Left message to call back  

## 2011-12-26 NOTE — Telephone Encounter (Signed)
Pt completed PT, requests referral/order for cardiac PT. Robert Arnold is currently still participating in OT.

## 2011-12-27 NOTE — Telephone Encounter (Signed)
Pt referred to cardiac rehab, he is aware and will await there call

## 2011-12-28 ENCOUNTER — Encounter (HOSPITAL_COMMUNITY): Payer: Self-pay | Admitting: Emergency Medicine

## 2011-12-28 ENCOUNTER — Inpatient Hospital Stay (HOSPITAL_COMMUNITY)
Admission: EM | Admit: 2011-12-28 | Discharge: 2011-12-30 | DRG: 303 | Disposition: A | Discharge status: Home / Self Care | Payer: Medicare Other | Attending: Cardiology | Admitting: Cardiology

## 2011-12-28 ENCOUNTER — Emergency Department (HOSPITAL_COMMUNITY): Payer: Medicare Other

## 2011-12-28 DIAGNOSIS — E785 Hyperlipidemia, unspecified: Secondary | ICD-10-CM | POA: Diagnosis present

## 2011-12-28 DIAGNOSIS — I5022 Chronic systolic (congestive) heart failure: Secondary | ICD-10-CM

## 2011-12-28 DIAGNOSIS — I999 Unspecified disorder of circulatory system: Principal | ICD-10-CM | POA: Diagnosis present

## 2011-12-28 DIAGNOSIS — I252 Old myocardial infarction: Secondary | ICD-10-CM

## 2011-12-28 DIAGNOSIS — I2510 Atherosclerotic heart disease of native coronary artery without angina pectoris: Secondary | ICD-10-CM

## 2011-12-28 DIAGNOSIS — I200 Unstable angina: Secondary | ICD-10-CM

## 2011-12-28 DIAGNOSIS — Z87891 Personal history of nicotine dependence: Secondary | ICD-10-CM

## 2011-12-28 DIAGNOSIS — Z9861 Coronary angioplasty status: Secondary | ICD-10-CM

## 2011-12-28 DIAGNOSIS — I480 Paroxysmal atrial fibrillation: Secondary | ICD-10-CM | POA: Diagnosis present

## 2011-12-28 DIAGNOSIS — B399 Histoplasmosis, unspecified: Secondary | ICD-10-CM | POA: Insufficient documentation

## 2011-12-28 DIAGNOSIS — I129 Hypertensive chronic kidney disease with stage 1 through stage 4 chronic kidney disease, or unspecified chronic kidney disease: Secondary | ICD-10-CM | POA: Diagnosis present

## 2011-12-28 DIAGNOSIS — N400 Enlarged prostate without lower urinary tract symptoms: Secondary | ICD-10-CM | POA: Diagnosis present

## 2011-12-28 DIAGNOSIS — E1169 Type 2 diabetes mellitus with other specified complication: Secondary | ICD-10-CM | POA: Diagnosis present

## 2011-12-28 DIAGNOSIS — N1831 Chronic kidney disease, stage 3a: Secondary | ICD-10-CM | POA: Diagnosis present

## 2011-12-28 DIAGNOSIS — R079 Chest pain, unspecified: Secondary | ICD-10-CM

## 2011-12-28 DIAGNOSIS — I10 Essential (primary) hypertension: Secondary | ICD-10-CM | POA: Diagnosis present

## 2011-12-28 DIAGNOSIS — M109 Gout, unspecified: Secondary | ICD-10-CM | POA: Diagnosis present

## 2011-12-28 DIAGNOSIS — N189 Chronic kidney disease, unspecified: Secondary | ICD-10-CM

## 2011-12-28 DIAGNOSIS — E119 Type 2 diabetes mellitus without complications: Secondary | ICD-10-CM | POA: Diagnosis present

## 2011-12-28 DIAGNOSIS — I4891 Unspecified atrial fibrillation: Secondary | ICD-10-CM | POA: Diagnosis present

## 2011-12-28 DIAGNOSIS — N138 Other obstructive and reflux uropathy: Secondary | ICD-10-CM | POA: Insufficient documentation

## 2011-12-28 DIAGNOSIS — E1122 Type 2 diabetes mellitus with diabetic chronic kidney disease: Secondary | ICD-10-CM | POA: Diagnosis present

## 2011-12-28 DIAGNOSIS — D649 Anemia, unspecified: Secondary | ICD-10-CM | POA: Diagnosis present

## 2011-12-28 DIAGNOSIS — N182 Chronic kidney disease, stage 2 (mild): Secondary | ICD-10-CM | POA: Diagnosis present

## 2011-12-28 DIAGNOSIS — D709 Neutropenia, unspecified: Secondary | ICD-10-CM

## 2011-12-28 DIAGNOSIS — G4733 Obstructive sleep apnea (adult) (pediatric): Secondary | ICD-10-CM | POA: Diagnosis present

## 2011-12-28 DIAGNOSIS — Z940 Kidney transplant status: Secondary | ICD-10-CM

## 2011-12-28 DIAGNOSIS — I5042 Chronic combined systolic (congestive) and diastolic (congestive) heart failure: Secondary | ICD-10-CM | POA: Diagnosis present

## 2011-12-28 HISTORY — DX: Acute myocardial infarction, unspecified: I21.9

## 2011-12-28 LAB — CBC
HCT: 33.7 % — ABNORMAL LOW (ref 39.0–52.0)
Hemoglobin: 11 g/dL — ABNORMAL LOW (ref 13.0–17.0)
MCV: 98.8 fL (ref 78.0–100.0)
WBC: 1.7 10*3/uL — ABNORMAL LOW (ref 4.0–10.5)

## 2011-12-28 LAB — MRSA PCR SCREENING: MRSA by PCR: NEGATIVE

## 2011-12-28 LAB — BASIC METABOLIC PANEL
BUN: 20 mg/dL (ref 6–23)
Chloride: 104 mEq/L (ref 96–112)
Creatinine, Ser: 1.13 mg/dL (ref 0.50–1.35)
Glucose, Bld: 143 mg/dL — ABNORMAL HIGH (ref 70–99)
Potassium: 4.1 mEq/L (ref 3.5–5.1)

## 2011-12-28 LAB — GLUCOSE, CAPILLARY
Glucose-Capillary: 220 mg/dL — ABNORMAL HIGH (ref 70–99)
Glucose-Capillary: 116 mg/dL — ABNORMAL HIGH (ref 70–99)

## 2011-12-28 LAB — LIPID PANEL
HDL: 56 mg/dL (ref >39)
LDL Cholesterol: 57 mg/dL (ref 0–99)
VLDL: 21 mg/dL (ref 0–40)

## 2011-12-28 LAB — TROPONIN I: Troponin I: <0.3 ng/mL (ref <0.30)

## 2011-12-28 MED ORDER — TACROLIMUS 1 MG PO CAPS
1.0000 mg | ORAL_CAPSULE | Freq: Two times a day (BID) | ORAL | Status: DC
  Administered 2011-12-28 – 2011-12-30 (×4): 1 mg via ORAL
  Filled 2011-12-28 (×5): qty 1

## 2011-12-28 MED ORDER — PREDNISONE 10 MG PO TABS
10.0000 mg | ORAL_TABLET | Freq: Every day | ORAL | Status: DC
  Administered 2011-12-29 – 2011-12-30 (×2): 10 mg via ORAL
  Filled 2011-12-28 (×2): qty 1

## 2011-12-28 MED ORDER — OMEGA-3-ACID ETHYL ESTERS 1 G PO CAPS
1.0000 g | ORAL_CAPSULE | Freq: Two times a day (BID) | ORAL | Status: DC
  Administered 2011-12-28 – 2011-12-30 (×4): 1 g via ORAL
  Filled 2011-12-28 (×5): qty 1

## 2011-12-28 MED ORDER — K PHOS MONO-SOD PHOS DI & MONO 155-852-130 MG PO TABS
250.0000 mg | ORAL_TABLET | Freq: Two times a day (BID) | ORAL | Status: DC
  Administered 2011-12-28 – 2011-12-30 (×4): 250 mg via ORAL
  Filled 2011-12-28 (×5): qty 1

## 2011-12-28 MED ORDER — MYCOPHENOLATE SODIUM 180 MG PO TBEC: 360.0000 mg | DELAYED_RELEASE_TABLET | Freq: Two times a day (BID) | ORAL | Status: DC

## 2011-12-28 MED ORDER — VALGANCICLOVIR HCL 450 MG PO TABS
450.0000 mg | ORAL_TABLET | Freq: Every day | ORAL | Status: DC
  Administered 2011-12-29 – 2011-12-30 (×2): 450 mg via ORAL
  Filled 2011-12-28 (×3): qty 1

## 2011-12-28 MED ORDER — ALPRAZOLAM 0.25 MG PO TABS
0.2500 mg | ORAL_TABLET | Freq: Two times a day (BID) | ORAL | Status: DC | PRN
  Administered 2011-12-28: 0.25 mg via ORAL
  Filled 2011-12-28: qty 1

## 2011-12-28 MED ORDER — SODIUM CHLORIDE 0.9 % IJ SOLN: 3.0000 mL | INTRAMUSCULAR | Status: DC | PRN

## 2011-12-28 MED ORDER — INSULIN ASPART 100 UNIT/ML ~~LOC~~ SOLN
0.0000 [IU] | Freq: Three times a day (TID) | SUBCUTANEOUS | Status: DC
  Administered 2011-12-28 (×2): 3 [IU] via SUBCUTANEOUS
  Administered 2011-12-29: 2 [IU] via SUBCUTANEOUS
  Administered 2011-12-29: 3 [IU] via SUBCUTANEOUS
  Filled 2011-12-28: qty 1

## 2011-12-28 MED ORDER — TRAVOPROST (BAK FREE) 0.004 % OP SOLN
1.0000 [drp] | Freq: Every day | OPHTHALMIC | Status: DC
  Administered 2011-12-28 – 2011-12-29 (×2): 1 [drp] via OPHTHALMIC
  Filled 2011-12-28: qty 2.5

## 2011-12-28 MED ORDER — ONDANSETRON HCL 4 MG/2ML IJ SOLN: 4.0000 mg | Freq: Four times a day (QID) | INTRAMUSCULAR | Status: DC | PRN

## 2011-12-28 MED ORDER — ASPIRIN 325 MG PO TABS
325.0000 mg | ORAL_TABLET | ORAL | Status: AC
  Administered 2011-12-28: 325 mg via ORAL
  Filled 2011-12-28: qty 1

## 2011-12-28 MED ORDER — NITROGLYCERIN IN D5W 200-5 MCG/ML-% IV SOLN
2.0000 ug/min | INTRAVENOUS | Status: DC
  Administered 2011-12-28: 5 ug/min via INTRAVENOUS
  Filled 2011-12-28: qty 250

## 2011-12-28 MED ORDER — FILGRASTIM 300 MCG/ML IJ SOLN
300.0000 ug | INTRAMUSCULAR | Status: DC
  Administered 2011-12-28: 300 ug via SUBCUTANEOUS
  Filled 2011-12-28: qty 1

## 2011-12-28 MED ORDER — TACROLIMUS 0.5 MG PO CAPS
0.5000 mg | ORAL_CAPSULE | Freq: Two times a day (BID) | ORAL | Status: DC
  Filled 2011-12-28: qty 1

## 2011-12-28 MED ORDER — INSULIN GLARGINE 100 UNIT/ML ~~LOC~~ SOLN
10.0000 [IU] | Freq: Two times a day (BID) | SUBCUTANEOUS | Status: DC
  Administered 2011-12-28 – 2011-12-30 (×4): 10 [IU] via SUBCUTANEOUS

## 2011-12-28 MED ORDER — NITROGLYCERIN 2 % TD OINT
0.5000 [in_us] | TOPICAL_OINTMENT | Freq: Four times a day (QID) | TRANSDERMAL | Status: DC
  Administered 2011-12-28: 0.5 [in_us] via TOPICAL
  Filled 2011-12-28: qty 1

## 2011-12-28 MED ORDER — LINAGLIPTIN 5 MG PO TABS
5.0000 mg | ORAL_TABLET | Freq: Every day | ORAL | Status: DC
  Administered 2011-12-29 – 2011-12-30 (×2): 5 mg via ORAL
  Filled 2011-12-28 (×3): qty 1

## 2011-12-28 MED ORDER — MORPHINE SULFATE 2 MG/ML IJ SOLN
2.0000 mg | INTRAMUSCULAR | Status: DC | PRN
  Administered 2011-12-28: 2 mg via INTRAVENOUS
  Filled 2011-12-28: qty 1

## 2011-12-28 MED ORDER — SULFAMETHOXAZOLE-TRIMETHOPRIM 400-80 MG PO TABS
1.0000 | ORAL_TABLET | ORAL | Status: DC
Start: 2011-12-30 — End: 2011-12-30
  Administered 2011-12-30: 1 via ORAL
  Filled 2011-12-28: qty 1

## 2011-12-28 MED ORDER — FINASTERIDE 5 MG PO TABS
5.0000 mg | ORAL_TABLET | Freq: Every day | ORAL | Status: DC
  Administered 2011-12-29 – 2011-12-30 (×2): 5 mg via ORAL
  Filled 2011-12-28 (×3): qty 1

## 2011-12-28 MED ORDER — BUPROPION HCL ER (XL) 300 MG PO TB24
300.0000 mg | ORAL_TABLET | Freq: Every day | ORAL | Status: DC
  Administered 2011-12-29 – 2011-12-30 (×2): 300 mg via ORAL
  Filled 2011-12-28 (×3): qty 1

## 2011-12-28 MED ORDER — ATORVASTATIN CALCIUM 40 MG PO TABS
40.0000 mg | ORAL_TABLET | Freq: Every day | ORAL | Status: DC
  Administered 2011-12-29: 40 mg via ORAL
  Filled 2011-12-28 (×3): qty 1

## 2011-12-28 MED ORDER — NITROGLYCERIN 0.4 MG SL SUBL
0.4000 mg | SUBLINGUAL_TABLET | SUBLINGUAL | Status: DC | PRN
  Administered 2011-12-28 (×2): 0.4 mg via SUBLINGUAL
  Filled 2011-12-28: qty 25

## 2011-12-28 MED ORDER — CLOPIDOGREL BISULFATE 75 MG PO TABS
75.0000 mg | ORAL_TABLET | Freq: Every day | ORAL | Status: DC
  Administered 2011-12-29 – 2011-12-30 (×2): 75 mg via ORAL
  Filled 2011-12-28 (×2): qty 1

## 2011-12-28 MED ORDER — ZOLPIDEM TARTRATE 5 MG PO TABS: 5.0000 mg | ORAL_TABLET | Freq: Every evening | ORAL | Status: DC | PRN

## 2011-12-28 MED ORDER — CARVEDILOL 3.125 MG PO TABS
3.1250 mg | ORAL_TABLET | Freq: Two times a day (BID) | ORAL | Status: DC
  Administered 2011-12-28 – 2011-12-30 (×4): 3.125 mg via ORAL
  Filled 2011-12-28 (×5): qty 1

## 2011-12-28 MED ORDER — TACROLIMUS 1 MG PO CAPS
1.0000 mg | ORAL_CAPSULE | Freq: Two times a day (BID) | ORAL | Status: DC
  Filled 2011-12-28: qty 1

## 2011-12-28 MED ORDER — CITALOPRAM HYDROBROMIDE 20 MG PO TABS
20.0000 mg | ORAL_TABLET | Freq: Every day | ORAL | Status: DC
  Administered 2011-12-28 – 2011-12-30 (×3): 20 mg via ORAL
  Filled 2011-12-28 (×3): qty 1

## 2011-12-28 MED ORDER — ACETAMINOPHEN 325 MG PO TABS
650.0000 mg | ORAL_TABLET | ORAL | Status: DC | PRN
  Administered 2011-12-28 – 2011-12-29 (×5): 650 mg via ORAL
  Filled 2011-12-28 (×5): qty 2

## 2011-12-28 MED ORDER — FENOFIBRATE 54 MG PO TABS
108.0000 mg | ORAL_TABLET | Freq: Every day | ORAL | Status: DC
  Administered 2011-12-29 – 2011-12-30 (×2): 108 mg via ORAL
  Filled 2011-12-28 (×3): qty 2

## 2011-12-28 MED ORDER — ITRACONAZOLE 100 MG PO CAPS
200.0000 mg | ORAL_CAPSULE | Freq: Every day | ORAL | Status: DC
  Administered 2011-12-29 – 2011-12-30 (×2): 200 mg via ORAL
  Filled 2011-12-28 (×3): qty 2

## 2011-12-28 MED ORDER — SODIUM CHLORIDE 0.9 % IJ SOLN
3.0000 mL | Freq: Two times a day (BID) | INTRAMUSCULAR | Status: DC
  Administered 2011-12-28 – 2011-12-30 (×4): 3 mL via INTRAVENOUS

## 2011-12-28 MED ORDER — ASPIRIN EC 81 MG PO TBEC
81.0000 mg | DELAYED_RELEASE_TABLET | Freq: Every day | ORAL | Status: DC
  Administered 2011-12-29 – 2011-12-30 (×2): 81 mg via ORAL
  Filled 2011-12-28 (×3): qty 1

## 2011-12-28 MED ORDER — SODIUM CHLORIDE 0.9 % IV SOLN: 250.0000 mL | INTRAVENOUS | Status: DC | PRN

## 2011-12-28 MED ORDER — FILGRASTIM 300 MCG/ML IJ SOLN: 300.0000 ug | INTRAMUSCULAR | Status: DC

## 2011-12-28 NOTE — ED Notes (Signed)
Pt ambulated to the bathroom and back to room without difficulty or distress; pt placed back on monitor, continuous pulse oximetry and blood pressure cuff; family at bedside

## 2011-12-28 NOTE — ED Provider Notes (Signed)
History     CSN: [id number]  Arrival date & time 12/28/11  0431   First MD Initiated Contact with Patient 12/28/11 0448      Chief Complaint  Patient presents with  . Chest Pain    (Consider location/radiation/quality/duration/timing/severity/associated sxs/prior treatment) HPI 66 year old male presents emergency part from home complaining of chest pain. He had some mild substernal pain yesterday throughout the day, some radiation into his neck. Tonight woke with the pain much worse. Pain was sharp and stabbing occurring with every heartbeat. Patient with past medical history significant for renal failure status post kidney transplant 3 months ago, an STEMI, 3 months ago. Patient has history of neutropenia discovered on Thursday, started on Neupogen at that time. He denies any fever chills, no cough. Patient denies similar symptoms with his previous and STEMI. He is recently started to be seen by Dr. Tempie Hoist with cardiology.  No history of GERD, no burning associated with pain.  Pain improves with standing, worse with lying and sitting.   Past Medical History  Diagnosis Date  . 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 Diabetes mellitus type 2, controlled   . Hyperlipidemia   . BPH (benign prostatic hyperplasia)   . Gout   . CAD (coronary artery disease)     s/p post-op NSTEMI. treated with stent to LAD. at [facility]  . PAF (paroxysmal atrial fibrillation)     s/p DC-CV in 6/13. Off coumadin due to ureteral bleed  . OSA (obstructive sleep apnea)   . 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 HTN (hypertension)   . MI (myocardial infarction) 09/26/2011    Past Surgical History  Procedure Date  . Colonoscopy   . Peritoneal catheter insertion 2011  . Av fistula placement 2011    Left forearm  . Kidney transplant 09/25/2011  . Coronary angioplasty with stent placement     Family History  Problem Relation  Age of Onset  . Heart attack Father   . Diabetes Mother   . COPD Mother     History  Substance Use Topics  . Smoking status: Former Smoker    Quit date: 11/29/2006  . Smokeless tobacco: Never Used  . Alcohol Use: Yes     1-2 a month      Review of Systems  All other systems reviewed and are negative.    Allergies  Review of patient's allergies indicates no known allergies.  Home Medications   Current Outpatient Rx  Name Route Sig Dispense Refill  . ASPIRIN EC 81 MG PO TBEC Oral Take 81 mg by mouth daily.    . BUPROPION HCL ER (XL) 300 MG PO TB24 Oral Take 300 mg by mouth daily.     Marland Kitchen CARVEDILOL 3.125 MG PO TABS Oral Take 3.125 mg by mouth 2 (two) times daily.     Marland Kitchen CITALOPRAM HYDROBROMIDE PO Oral Take 20 mg by mouth daily.     Marland Kitchen CLOPIDOGREL BISULFATE 75 MG PO TABS Oral Take 75 mg by mouth daily.     . FENOFIBRATE MICRONIZED 134 MG PO CAPS Oral Take 134 mg by mouth daily.     . NEUPOGEN IJ Injection Inject 1 each as directed once. For low blood counts Kidney Transplant patient Course is 1 prefilled syringe Thursday Friday and Saturday Managed by [employer] in [place]    . FINASTERIDE 5 MG PO TABS Oral Take 5 mg by mouth  daily.     . INSULIN GLARGINE 100 UNIT/ML [place] SOLN Subcutaneous Inject 10 Units into the skin 2 (two) times daily.     . ITRACONAZOLE 100 MG PO CAPS Oral Take 200 mg by mouth daily. 2 tab    . METFORMIN HCL 500 MG PO TABS Oral Take by mouth 3 (three) times daily.     Marland Kitchen FISH OIL 1200 MG PO CAPS Oral Take 1 capsule by mouth 2 (two) times daily.    Marland Kitchen PHOSPHA 250 NEUTRAL [phone] MG PO TABS Oral Take 2 tablets by mouth 2 (two) times daily.     Marland Kitchen PREDNISONE 10 MG PO TABS Oral Take 10 mg by mouth daily.    Marland Kitchen ROSUVASTATIN CALCIUM 40 MG PO TABS Oral Take 20 mg by mouth daily.      Marland Kitchen SITAGLIPTIN PHOSPHATE 25 MG PO TABS Oral Take 25 mg by mouth daily.    . SULFAMETHOXAZOLE-TRIMETHOPRIM 400-80 MG PO TABS Oral Take 1 tablet by mouth every Monday, Wednesday,  and Friday.     Marland Kitchen TACROLIMUS 1 MG PO CAPS Oral Take 1 mg by mouth 2 (two) times daily.    . TRAVATAN Z 0.004 % OP SOLN Both Eyes Place 1 drop into both eyes at bedtime.     Marland Kitchen VALCYTE 450 MG PO TABS Oral Take 450 mg by mouth daily.     Marland Kitchen MYFORTIC 360 MG PO TBEC  360 mg 2 (two) times daily.     Marland Kitchen PROGRAF 0.5 MG PO CAPS Oral Take 0.5 mg by mouth 2 (two) times daily.       BP 104/61  Pulse 72  Temp 98.1 F (36.7 C) (Oral)  Resp 15  SpO2 99%  Physical Exam  Nursing note and vitals reviewed. Constitutional: He is oriented to person, place, and time. He appears well-developed and well-nourished.  HENT:  Head: Normocephalic and atraumatic.  Nose: Nose normal.  Mouth/Throat: Oropharynx is clear and moist.  Eyes: Conjunctivae normal and EOM are normal. Pupils are equal, round, and reactive to light.  Neck: Normal range of motion. Neck supple. No JVD present. No tracheal deviation present. No thyromegaly present.  Cardiovascular: Normal rate, regular rhythm, normal heart sounds and intact distal pulses.  Exam reveals no gallop and no friction rub.   No murmur heard. Pulmonary/Chest: Effort normal and breath sounds normal. No stridor. No respiratory distress. He has no wheezes. He has no rales. He exhibits no tenderness.  Abdominal: Soft. Bowel sounds are normal. He exhibits no distension and no mass. There is no tenderness. There is no rebound and no guarding.  Musculoskeletal: Normal range of motion. He exhibits no edema and no tenderness.  Lymphadenopathy:    He has no cervical adenopathy.  Neurological: He is alert and oriented to person, place, and time. He exhibits normal muscle tone. Coordination normal.  Skin: Skin is warm and dry. No rash noted. No erythema. No pallor.  Psychiatric: He has a normal mood and affect. His behavior is normal. Judgment and thought content normal.    ED Course  Procedures (including critical care time)  Labs Reviewed  CBC - Abnormal; Notable for the  following:    WBC 1.7 (*)     RBC 3.41 (*)     Hemoglobin 11.0 (*)     HCT 33.7 (*)     Platelets 115 (*)     All other components within normal limits  BASIC METABOLIC PANEL - Abnormal; Notable for the following:    Glucose,  Bld 143 (*)     GFR calc non Af Amer 67 (*)     GFR calc Af Amer 78 (*)     All other components within normal limits  TROPONIN I   Dg Chest 2 View  12/28/2011  *RADIOLOGY REPORT*  Clinical Data: Chest pain  CHEST - 2 VIEW  Comparison: 02/15/2009  Findings: Heart size is normal.  No pleural effusion or edema.  Calcified granuloma identified within the left upper lobe similar to previous exam.  No airspace consolidation.  No acute bony abnormalities are identified.  IMPRESSION:  1.  No acute findings.   Original Report Authenticated By: Angelita Ingles, M.D.     Date: 12/28/2011  Rate: 78  Rhythm: normal sinus rhythm  QRS Axis: normal  Intervals: PR prolonged  ST/T Wave abnormalities: ST depressions laterally  Conduction Disutrbances:first-degree A-V block   Narrative Interpretation:   Old EKG Reviewed: unchanged    1. Chest pain   2. Coronary atherosclerosis of native coronary artery   3. Neutropenia       MDM  66 year old male with chest pain, recent history of an STEMI. Pain seems atypical given its sharp stabbing nature, however has improved with nitroglycerin, will discuss with cardiology further evaluation. Differential includes ACS, esophageal spasm, PE, GERD, musculoskeletal. Patient is not tachycardic, no dyspnea or hypoxia. Feel PE is less likely. Symptoms do not seem consistent with GERD, and her not reproducible to indicate musculoskeletal.  7:30 AM Discussed with Dr Wynonia Lawman, on call for cardiology who will see patient in the ED.  Pt with resolution of pain after ntg, but has returned slightly, will order NTG paste.      Kalman Drape, MD 12/28/11 0730

## 2011-12-28 NOTE — ED Notes (Signed)
Talked with pt and he reports that he has taken his daily transplant medication.

## 2011-12-28 NOTE — ED Notes (Signed)
Pt transported to XRay 

## 2011-12-28 NOTE — ED Notes (Signed)
Pt states chest pain decreased to 1/10. Pt appears to be in no acute distress. Family at bedside. Will continue to monitor pt.

## 2011-12-28 NOTE — ED Notes (Signed)
Nitropaste removed from chest.

## 2011-12-28 NOTE — ED Notes (Signed)
Pt given a pillow.  

## 2011-12-28 NOTE — ED Notes (Signed)
CBG-[id number] Prior to meal tray.

## 2011-12-28 NOTE — ED Notes (Signed)
Patient currently sitting up in bed; no respiratory or acute distress noted.  Patient updated on plan of care; patient informed that consult to cardiology has been made.  Patient has no there questions or concerns at this time; will continue to monitor.

## 2011-12-28 NOTE — ED Notes (Signed)
Dr. Sharol Given notified of pt status and relief of chest pain as well as recent BP of 104/61.

## 2011-12-28 NOTE — H&P (Addendum)
Physician History and Physical  Patient ID: Robert Arnold MRN: [id number] DOB/AGE: 06/13/1945 66 y.o. Admit date: 12/28/2011  Primary Care Physician:MCKEOWN,WILLIAM DAVID, MD Primary Cardiologis  [place]  HPI: The patient has presented to the emergency room with chest discomfort. This is the first discomfort he has ever had. He awoke with it this morning. He describes it as a sensation of feeling discomfort in the center of his chest related to his heartbeat. He does not feel it when he stands up. He says he feels that sitting and lying. It is not worse with a deep breath. He seems to have responded to sublingual nitroglycerin in the emergency room.  The patient underwent living related kidney transplant at the [facility] in September 25, 2011. On the first day postop he had a non-STEMI. Catheterization revealed multivessel disease. He received a stent to the LAD. We think that it was DES. He had atrial fibrillation and was cardioverted. He was treated with Coumadin for a week but this was stopped due to 2 ureteral bleeding that required a stent. Echo at that time revealed an ejection fraction of 20% with global hypokinesis. He was discharged from their rehabilitation unit October 17, 2011. He goes to neurology rehabilitation for outpatient balance and stamina issues. He was seen to establish care with the heart failure program with Dr. Haroldine Laws on November 30, 2011. He was stable at that time. Plans were made for followup echo and a followup visit.  Prior to his kidney transplant he was on peritoneal dialysis. He says that he never had an episode of pericarditis. He did not have chest pain in the past. When he developed his non-STEMI in June, he had shortness of breath 1 day after his renal transplant.   Past Medical History  Diagnosis Date  . 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 Diabetes mellitus type 2, controlled   . Hyperlipidemia     . BPH (benign prostatic hyperplasia)   . Gout   . CAD (coronary artery disease)     s/p post-op NSTEMI. treated with stent to LAD. at [facility]  . PAF (paroxysmal atrial fibrillation)     s/p DC-CV in 6/13. Off coumadin due to ureteral bleed  . OSA (obstructive sleep apnea)   . 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 HTN (hypertension)   . MI (myocardial infarction) 09/26/2011    Family History  Problem Relation Age of Onset  . Heart attack Father   . Diabetes Mother   . COPD Mother     History   Social History  . Marital Status: Married    Spouse Name: N/A    Number of Children: N/A  . Years of Education: N/A   Occupational History  . Not on file.   Social History Main Topics  . Smoking status: Former Smoker    Quit date: 11/29/2006  . Smokeless tobacco: Never Used  . Alcohol Use: Yes     1-2 a month  . Drug Use: No  . Sexually Active: Not on file   Other Topics Concern  . Not on file   Social History Narrative  . No narrative on file    Past Surgical History  Procedure Date  . Colonoscopy   . Peritoneal catheter insertion 2011  . Av fistula placement 2011    Left forearm  . Kidney transplant 09/25/2011  . Coronary angioplasty with  stent placement     Review of systems:   Patient denies fever, chills, headache, sweats, rash, change in vision, change in hearing, cough, nausea vomiting, urinary symptoms. All other systems are reviewed and are negative.   Physical Exam: Blood pressure 108/69, pulse 73, temperature 98.1 F (36.7 C), temperature source Oral, resp. rate 26, SpO2 99.00%.    The patient is comfortable in bed. He's not having chest pain at this time. He is oriented to person time and place. Affect is normal. There is no jugulovenous distention. Lungs are clear. Respiratory effort is nonlabored. Cardiac exam reveals S1-S2. There is a 2/6 crescendo decrescendo systolic murmur. The abdomen is soft. His prior peritoneal  dialysis catheter is no longer present. There is no peripheral edema. There no musculoskeletal deformities. There are no skin rashes.  Labs:   Lab Results  Component Value Date   WBC 1.7* 12/28/2011   HGB 11.0* 12/28/2011   HCT 33.7* 12/28/2011   MCV 98.8 12/28/2011   PLT 115* 12/28/2011    Lab 12/28/11 0557  NA 136  K 4.1  CL 104  CO2 24  BUN 20  CREATININE 1.13  CALCIUM 10.3  PROT --  BILITOT --  ALKPHOS --  ALT --  AST --  GLUCOSE 143*   Lab Results  Component Value Date   TROPONINI <0.30 12/28/2011       Radiology: Dg Chest 2 View  12/28/2011  *RADIOLOGY REPORT*  Clinical Data: Chest pain  CHEST - 2 VIEW  Comparison: 02/15/2009  Findings: Heart size is normal.  No pleural effusion or edema.  Calcified granuloma identified within the left upper lobe similar to previous exam.  No airspace consolidation.  No acute bony abnormalities are identified.  IMPRESSION:  1.  No acute findings.   Original Report Authenticated By: Angelita Ingles, M.D.    EKG:   I have reviewed the EKG. There is no acute change. There are nonspecific ST-T wave changes. There small lateral Q waves.   ASSESSMENT AND PLAN:   Principal Problem:   *Chest pain  Unstable angina   The patient awoke with chest pain this morning. This is the first time he has had this pain. The pain is unusual in that it seemed to be related to his heartbeat. It also appeared to be positional. He had it does not seem to be consistent with pericarditis. There no acute EKG changes. The pain did appear to respond to nitroglycerin. The patient will be admitted to rule out MI. He'll be treated with IV nitroglycerin. IV heparin will not be used. He is not anticoagulated because of a ureteral bleed requiring a stent. However should he become unstable and require IV heparin, I suspect that he can be used. The patient had an acute MI after renal transplant June, 2013. He has significant left ventricular dysfunction. We need to be sure  that he is not having any evidence of recurrent ischemia. He will be continued on his aspirin and Plavix.  Active Problems:   Chronic systolic heart failure    His volume status appears to be quite good at this time. No change in therapy.   Diabetes mellitus type 2, controlled     He'll be kept on his medications.   PAF (paroxysmal atrial fibrillation)    He had paroxysmal atrial fibrillation around the time of his myocardial infarction in June, 2013. He is not anticoagulated because of his ureteral bleed. He is in sinus rhythm now.    Chronic  kidney disease     The patient is status post renal transplant. His BUN and creatinine are good. Careful attention will be paid to be sure that we are continuing all of his appropriate medications.   CAD (coronary artery disease)    This problem is discussed under the chest pain note above   Systolic murmur     The patient has a systolic murmur. At some point he will need a followup 2-D echo.  Signed: Dola Argyle 12/28/2011, 8:49AM

## 2011-12-28 NOTE — ED Notes (Signed)
Pt denies SOB and diaphoresis. Pt localized chest pain to central chest. Pt denies hx of blood clots. Pt denies recent fever and cough. Pt currently having chest pain.

## 2011-12-28 NOTE — ED Notes (Signed)
Reports having CP since yesterday morning, but pain woke him up tonight; describes as stabbing with heart beats; denies n/v/sob/diahporesis

## 2011-12-28 NOTE — ED Notes (Signed)
Cardiology at the bedside with the pt.

## 2011-12-29 DIAGNOSIS — R079 Chest pain, unspecified: Secondary | ICD-10-CM

## 2011-12-29 DIAGNOSIS — N189 Chronic kidney disease, unspecified: Secondary | ICD-10-CM

## 2011-12-29 DIAGNOSIS — I5022 Chronic systolic (congestive) heart failure: Secondary | ICD-10-CM

## 2011-12-29 DIAGNOSIS — I2510 Atherosclerotic heart disease of native coronary artery without angina pectoris: Secondary | ICD-10-CM

## 2011-12-29 LAB — CBC
MCH: 30.6 pg (ref 26.0–34.0)
MCHC: 31.1 g/dL (ref 30.0–36.0)
MCV: 98.2 fL (ref 78.0–100.0)
Platelets: 102 10*3/uL — ABNORMAL LOW (ref 150–400)
RBC: 2.78 MIL/uL — ABNORMAL LOW (ref 4.22–5.81)
RDW: 14.4 % (ref 11.5–15.5)

## 2011-12-29 LAB — BASIC METABOLIC PANEL
Calcium: 10.1 mg/dL (ref 8.4–10.5)
Creatinine, Ser: 0.98 mg/dL (ref 0.50–1.35)
GFR calc Af Amer: >90 mL/min (ref >90)
GFR calc non Af Amer: 86 mL/min — ABNORMAL LOW (ref >90)
Sodium: 135 mEq/L (ref 135–145)

## 2011-12-29 LAB — HEMOGLOBIN A1C: Hgb A1c MFr Bld: 6.5 % — ABNORMAL HIGH (ref <5.7)

## 2011-12-29 LAB — GLUCOSE, CAPILLARY
Glucose-Capillary: 154 mg/dL — ABNORMAL HIGH (ref 70–99)
Glucose-Capillary: 208 mg/dL — ABNORMAL HIGH (ref 70–99)
Glucose-Capillary: 231 mg/dL — ABNORMAL HIGH (ref 70–99)

## 2011-12-29 MED ORDER — INSULIN ASPART 100 UNIT/ML ~~LOC~~ SOLN
0.0000 [IU] | Freq: Three times a day (TID) | SUBCUTANEOUS | Status: DC
  Administered 2011-12-29: 11 [IU] via SUBCUTANEOUS
  Administered 2011-12-30: 2 [IU] via SUBCUTANEOUS

## 2011-12-29 NOTE — Progress Notes (Signed)
SUBJECTIVE:  No chest pain.  It went away about 5 hours ago.  NTG is being weaned off.     PHYSICAL EXAM Filed Vitals:   12/29/11 0405 12/29/11 0500 12/29/11 0600 12/29/11 0700  BP:  110/60 100/59 114/62  Pulse: 66 64 69   Temp:    97.4 F (36.3 C)  TempSrc:    Oral  Resp:      Height:      Weight:      SpO2: 96% 95% 97%    General:  No distress HEENT:  PERRL Lungs:  Clear Heart:  RRR, no rub Abdomen:  Positive bowel sounds, no rebound no guarding. Extremities:  No edema Neuro: Nonfocal  LABS: Lab Results  Component Value Date   TROPONINI <0.30 12/28/2011   Results for orders placed during the hospital encounter of 12/28/11 (from the past 24 hour(s))  GLUCOSE, CAPILLARY     Status: Abnormal   Collection Time   12/28/11 10:10 AM      Component Value Range   Glucose-Capillary 220 (*) 70 - 99 mg/dL  TROPONIN I     Status: Normal   Collection Time   12/28/11  3:05 PM      Component Value Range   Troponin I <0.30  <0.30 ng/mL  LIPID PANEL     Status: Normal   Collection Time   12/28/11  3:06 PM      Component Value Range   Cholesterol 134  0 - 200 mg/dL   Triglycerides 103  <150 mg/dL   HDL 56  >39 mg/dL   Total CHOL/HDL Ratio 2.4     VLDL 21  0 - 40 mg/dL   LDL Cholesterol 57  0 - 99 mg/dL  TSH     Status: Normal   Collection Time   12/28/11  3:12 PM      Component Value Range   TSH 0.750  0.350 - 4.500 uIU/mL  HEMOGLOBIN A1C     Status: Abnormal   Collection Time   12/28/11  3:12 PM      Component Value Range   Hemoglobin A1C 6.5 (*) <5.7 %   Mean Plasma Glucose 140 (*) <117 mg/dL  MRSA PCR SCREENING     Status: Normal   Collection Time   12/28/11  4:17 PM      Component Value Range   MRSA by PCR NEGATIVE  NEGATIVE  GLUCOSE, CAPILLARY     Status: Abnormal   Collection Time   12/28/11  4:34 PM      Component Value Range   Glucose-Capillary 243 (*) 70 - 99 mg/dL  TROPONIN I     Status: Normal   Collection Time   12/28/11  8:00 PM      Component Value  Range   Troponin I <0.30  <0.30 ng/mL  GLUCOSE, CAPILLARY     Status: Abnormal   Collection Time   12/28/11  9:49 PM      Component Value Range   Glucose-Capillary 116 (*) 70 - 99 mg/dL  CBC     Status: Abnormal   Collection Time   12/29/11  4:40 AM      Component Value Range   WBC 3.1 (*) 4.0 - 10.5 K/uL   RBC 2.78 (*) 4.22 - 5.81 MIL/uL   Hemoglobin 8.5 (*) 13.0 - 17.0 g/dL   HCT 27.3 (*) 39.0 - 52.0 %   MCV 98.2  78.0 - 100.0 fL   MCH 30.6  26.0 - 34.0  pg   MCHC 31.1  30.0 - 36.0 g/dL   RDW 14.4  11.5 - 15.5 %   Platelets 102 (*) 150 - 400 K/uL  BASIC METABOLIC PANEL     Status: Abnormal   Collection Time   12/29/11  4:40 AM      Component Value Range   Sodium 135  135 - 145 mEq/L   Potassium 3.7  3.5 - 5.1 mEq/L   Chloride 101  96 - 112 mEq/L   CO2 23  19 - 32 mEq/L   Glucose, Bld 125 (*) 70 - 99 mg/dL   BUN 16  6 - 23 mg/dL   Creatinine, Ser 0.98  0.50 - 1.35 mg/dL   Calcium 10.1  8.4 - 10.5 mg/dL   GFR calc non Af Amer 86 (*) >90 mL/min   GFR calc Af Amer >90  >90 mL/min    Intake/Output Summary (Last 24 hours) at 12/29/11 0855 Last data filed at 12/29/11 0700  Gross per 24 hour  Intake 460.18 ml  Output   1175 ml  Net -714.82 ml    EKG:  Sinus rate 69 with PVCs,  Nonspecific anterior ST depression.  No change since previous.  12/29/2011  ASSESSMENT AND PLAN:  Chest pain  No objective evidence of ischemia (troponin negative x 3).  No acute EKG changes.  Now no pain.  I will stop the NTG.  If he has no further chest pain I would suggest no further work up.   Chronic systolic heart failure  He seems to be euvolemic.  At this point, no change in therapy is indicated.  We have reviewed salt and fluid restrictions.  No further cardiovascular testing is indicated.  Diabetes mellitus type 2, controlled  Blood sugars are well controlled.  Continue current therapy.   PAF (paroxysmal atrial fibrillation)  Maintaining NSR.  No change in therapy.    Chronic kidney  disease  Status post kidney transplant.  His Myfortic is on hold secondary to his low blood counts.  Other meds continue.   CAD (coronary artery disease)  History of stenting recently as described in the H&P.  Continue ASA and Plavix.  Anemia No active bleeding.  I will check a stool guaiac.  His Hgb is down from admission.  Repeat CBC in the am.  Minus Breeding 12/29/2011 8:55 AM

## 2011-12-29 NOTE — Progress Notes (Signed)
Pt having CP unrelieved by tiltrating nitro gtt up. Pt states 4/10 CP. CP descriptors unchanged. Pt comfortable. EKG on chart. No noted changes. MD notified. Orders received. Will continue to monitor.  Perkins, [place] Elizabeth

## 2011-12-30 LAB — CBC
HCT: 36 % — ABNORMAL LOW (ref 39.0–52.0)
Hemoglobin: 11.8 g/dL — ABNORMAL LOW (ref 13.0–17.0)
MCH: 32.1 pg (ref 26.0–34.0)
MCV: 97.8 fL (ref 78.0–100.0)
Platelets: 119 10*3/uL — ABNORMAL LOW (ref 150–400)
RBC: 3.68 MIL/uL — ABNORMAL LOW (ref 4.22–5.81)
WBC: 4.8 10*3/uL (ref 4.0–10.5)

## 2011-12-30 LAB — BASIC METABOLIC PANEL
BUN: 15 mg/dL (ref 6–23)
CO2: 28 mEq/L (ref 19–32)
Calcium: 10.3 mg/dL (ref 8.4–10.5)
Chloride: 105 mEq/L (ref 96–112)
Creatinine, Ser: 1.03 mg/dL (ref 0.50–1.35)
Glucose, Bld: 143 mg/dL — ABNORMAL HIGH (ref 70–99)

## 2011-12-30 LAB — GLUCOSE, CAPILLARY
Glucose-Capillary: 147 mg/dL — ABNORMAL HIGH (ref 70–99)
Glucose-Capillary: 204 mg/dL — ABNORMAL HIGH (ref 70–99)

## 2011-12-30 NOTE — Discharge Summary (Signed)
CARDIOLOGY DISCHARGE SUMMARY    [name],  MRN: [id number], DOB/AGE: 1945/06/30 66 y.o.  Admit date: 12/28/2011 Discharge date: 12/30/2011  Primary Care Physician: Robert Green. Melford Aase, MD Primary Cardiologist: Robert Laws, MD  Primary Discharge Diagnosis:  1. Chest pain, resolved  Secondary Discharge Diagnoses:  1. CAD s/p recent NSTEMI and PCI to LAD June 2013 2. 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 LV dysfunction, EF 20%, with chronic systolic HF 4. PAFib 5. HTN 6. Dyslipidemia 7. DM 8. Gout 9. Histoplasmosis 10. OSA  Procedures This Admission:  None   History and Hospital Course:  Robert Arnold is a 66 year old gentleman with CAD, LV dysfunction and chronic systolic HF who presented to Robert emergency room with chest discomfort. He described it as a sensation of pulsating discomfort in Robert center of his chest related to his heartbeat. It did not change with posture/position. It did not change with respiration or cough. It was worse with lying down. He was admitted for further evaluation.   Of note, Robert Arnold recently underwent kidney transplantation at [facility] in September 25, 2011. On Robert first day post-op he had a non-STEMI. Catheterization revealed multivessel disease. He received a stent to Robert LAD. He had atrial fibrillation and was cardioverted. He was treated with Coumadin for a week but this was stopped due to ureteral bleeding that required a stent. Echo at that time revealed an ejection fraction of 20% with global hypokinesis. He was discharged from their rehabilitation unit October 17, 2011. He goes to neurology rehabilitation for outpatient balance and stamina issues. He was seen to establish care with Robert heart failure program with Dr. Haroldine Arnold on November 30, 2011. He was stable at that time. Plans were made for followup echo and a followup visit.    He is now chest pain free. His IV nitroglycerin has been weaned  off. MI has been ruled out as his serial CEs are negative. His 12-lead ECGs show no acute abnormalities. He has been seen, examined and deemed stable for discharge today by Dr. Cristopher Arnold.   Discharge Vitals: Blood pressure 126/74, pulse 69, temperature 97.3 F (36.3 C), temperature source Oral, resp. rate 14, height 5\' 8"  (1.727 m), weight 186 lb 15.2 oz (84.8 kg), SpO2 99.00%.   Labs: Lab Results  Component Value Date   WBC 4.8 12/30/2011   HGB 11.8* 12/30/2011   HCT 36.0* 12/30/2011   MCV 97.8 12/30/2011   PLT 119* 12/30/2011    Lab 12/30/11 0505  NA 141  K 3.9  CL 105  CO2 28  BUN 15  CREATININE 1.03  CALCIUM 10.3  PROT --  BILITOT --  ALKPHOS --  ALT --  AST --  GLUCOSE 143*   Lab Results  Component Value Date   TROPONINI <0.30 12/28/2011    Disposition:  Robert Arnold is being discharged in stable condition.  Follow-up: Follow-up Information    Follow up with [place]. On 01/02/2012. (At 9:00 AM)    Contact information:   [address] [place] [phone]  Discharge Medications:    Medication List     As of 12/30/2011 10:16 AM    TAKE these medications         aspirin EC 81 MG tablet   Take 81 mg by mouth daily.      buPROPion 300 MG 24 hr tablet   Commonly  known as: WELLBUTRIN XL   Take 300 mg by mouth daily.      carvedilol 3.125 MG tablet   Commonly known as: COREG   Take 3.125 mg by mouth 2 (two) times daily.      CITALOPRAM HYDROBROMIDE PO   Take 20 mg by mouth daily.      clopidogrel 75 MG tablet   Commonly known as: PLAVIX   Take 75 mg by mouth daily.      fenofibrate micronized 134 MG capsule   Commonly known as: LOFIBRA   Take 134 mg by mouth daily.      finasteride 5 MG tablet   Commonly known as: PROSCAR   Take 5 mg by mouth daily.      Fish Oil 1200 MG Caps   Take 1 capsule by mouth 2 (two) times daily.      insulin glargine 100 UNIT/ML injection    Commonly known as: LANTUS   Inject 10 Units into Robert skin 2 (two) times daily.      itraconazole 100 MG capsule   Commonly known as: SPORANOX   Take 200 mg by mouth daily. 2 tab      metFORMIN 500 MG tablet   Commonly known as: GLUCOPHAGE   Take by mouth 3 (three) times daily.      MYFORTIC 360 MG Tbec   Generic drug: mycophenolate   360 mg 2 (two) times daily.      NEUPOGEN IJ   Inject 1 each as directed once. For low blood counts  Kidney Transplant Arnold  Course is 1 prefilled syringe Thursday Friday and Saturday  Managed by [facility] in [place] 250 NEUTRAL [id number] MG tablet   Generic drug: phosphorus   Take 2 tablets by mouth 2 (two) times daily.      predniSONE 10 MG tablet   Commonly known as: DELTASONE   Take 10 mg by mouth daily.      PROGRAF 0.5 MG capsule   Generic drug: tacrolimus   Take 0.5 mg by mouth 2 (two) times daily.      tacrolimus 1 MG capsule   Commonly known as: PROGRAF   Take 1 mg by mouth 2 (two) times daily.      rosuvastatin 40 MG tablet   Commonly known as: CRESTOR   Take 20 mg by mouth daily.      sitaGLIPtin 25 MG tablet   Commonly known as: JANUVIA   Take 25 mg by mouth daily.      sulfamethoxazole-trimethoprim 400-80 MG per tablet   Commonly known as: BACTRIM,SEPTRA   Take 1 tablet by mouth every Monday, Wednesday, and Friday.      TRAVATAN Z 0.004 % Soln ophthalmic solution   Generic drug: Travoprost (BAK Free)   Place 1 drop into both eyes at bedtime.      VALCYTE 450 MG tablet   Generic drug: valGANciclovir   Take 450 mg by mouth daily.      Duration of Discharge Encounter: Greater than 30 minutes including physician time.  Signed, Ileene Hutchinson, PA-C 12/30/2011, 10:16 AM

## 2011-12-30 NOTE — Progress Notes (Signed)
Patient ID: Robert Arnold, male   DOB: November 17, 1945, 66 y.o.   MRN: [id number] Subjective:  No chest pain or sob." I am ready to go home."  Objective:  Vital Signs in the last 24 hours: Temp:  [97.3 F (36.3 C)-98.9 F (37.2 C)] 97.3 F (36.3 C) (09/23 0800) Resp:  [14-20] 14  (09/23 0330) BP: (93-129)/(17-74) 126/74 mmHg (09/23 0800) SpO2:  [98 %-99 %] 99 % (09/23 0800)  Intake/Output from previous day: 09/22 0701 - 09/23 0700 In: 1480 [P.O.:1440; I.V.:40] Out: -  Intake/Output from this shift: Total I/O In: 240 [P.O.:240] Out: -   Physical Exam: Well appearing NAD HEENT: Unremarkable Neck:  No JVD, no thyromegally Lungs:  Clear with no wheezes. HEART:  Regular rate rhythm, no murmurs, no rubs, no clicks Abd:  Flat, positive bowel sounds, no organomegally, no rebound, no guarding Ext:  2 plus pulses, no edema, no cyanosis, no clubbing Skin:  No rashes no nodules Neuro:  CN II through XII intact, motor grossly intact  Lab Results:  Basename 12/30/11 0505 12/29/11 0440  WBC 4.8 3.1*  HGB 11.8* 8.5*  PLT 119* 102*    Basename 12/30/11 0505 12/29/11 0440  NA 141 135  K 3.9 3.7  CL 105 101  CO2 28 23  GLUCOSE 143* 125*  BUN 15 16  CREATININE 1.03 0.98    Basename 12/28/11 2000 12/28/11 1505  TROPONINI <0.30 <0.30   Hepatic Function Panel No results found for this basename: PROT,ALBUMIN,AST,ALT,ALKPHOS,BILITOT,BILIDIR,IBILI in the last 72 hours  Basename 12/28/11 1506  CHOL 134   No results found for this basename: PROTIME in the last 72 hours  Imaging: No results found.  Cardiac Studies: Tele - NSR Assessment/Plan:  1. Chest pain - No recurrence. Etiology most likely pleuro- pericarditis. He has followup with Dr. Jeffie Pollock. 2. Chronic systolic CHF - well compensated. Continue low sodium diet and CHF meds. 3. Disp. - ok to discharge today with followup with Dr. Reine Just.  LOS: 2 days    Cristopher Peru, M.D. 12/30/2011, 10:07 AM

## 2011-12-30 NOTE — Care Management Note (Signed)
    Page 1 of 1   12/30/2011     11:27:04 AM   CARE MANAGEMENT NOTE 12/30/2011  Patient:  Robert Arnold, Robert Arnold   Account Number:  [id number]  Date Initiated:  12/30/2011  Documentation initiated by:  Elissa Hefty  Subjective/Objective Assessment:   adm w angina     Action/Plan:   lives w wife, pcp dr Magnus Sinning   Anticipated DC Date:  12/30/2011   Anticipated DC Plan:  [place]  CM consult      Choice offered to / List presented to:             Status of service:   Medicare Important Message given?   (If response is "NO", the following Medicare IM given date fields will be blank) Date Medicare IM given:   Date Additional Medicare IM given:    Discharge Disposition:  HOME/SELF CARE  Per UR Regulation:  Reviewed for med. necessity/level of care/duration of stay  If discussed at [place] of Stay Meetings, dates discussed:    Comments:  9/23 11:30a debbie Trinna Kunst rn,bsn [id number]

## 2011-12-31 ENCOUNTER — Ambulatory Visit: Payer: Medicare Other | Admitting: Occupational Therapy

## 2012-01-02 ENCOUNTER — Encounter (HOSPITAL_COMMUNITY): Payer: BC Managed Care – PPO

## 2012-01-02 ENCOUNTER — Ambulatory Visit (HOSPITAL_COMMUNITY)
Admission: RE | Admit: 2012-01-02 | Discharge: 2012-01-02 | Disposition: A | Discharge status: Home / Self Care | Payer: Medicare Other | Source: Ambulatory Visit | Attending: Internal Medicine | Admitting: Internal Medicine

## 2012-01-02 ENCOUNTER — Ambulatory Visit (HOSPITAL_COMMUNITY): Payer: BC Managed Care – PPO

## 2012-01-02 ENCOUNTER — Ambulatory Visit: Payer: Medicare Other | Admitting: Occupational Therapy

## 2012-01-02 ENCOUNTER — Encounter (HOSPITAL_COMMUNITY): Payer: Self-pay

## 2012-01-02 VITALS — BP 122/68 | HR 77 | Ht 68.0 in | Wt 181.8 lb

## 2012-01-02 DIAGNOSIS — I509 Heart failure, unspecified: Secondary | ICD-10-CM

## 2012-01-02 DIAGNOSIS — I369 Nonrheumatic tricuspid valve disorder, unspecified: Secondary | ICD-10-CM

## 2012-01-02 DIAGNOSIS — I2510 Atherosclerotic heart disease of native coronary artery without angina pectoris: Secondary | ICD-10-CM

## 2012-01-02 DIAGNOSIS — I5022 Chronic systolic (congestive) heart failure: Secondary | ICD-10-CM | POA: Insufficient documentation

## 2012-01-02 DIAGNOSIS — I200 Unstable angina: Secondary | ICD-10-CM | POA: Insufficient documentation

## 2012-01-02 MED ORDER — NITROGLYCERIN 0.4 MG SL SUBL: 0.4000 mg | SUBLINGUAL_TABLET | SUBLINGUAL | Status: DC | PRN

## 2012-01-02 NOTE — Assessment & Plan Note (Addendum)
No clinical HF. Echo images reviewed personally. Results reviewed with him and his wife. EF has recovered.  Will continue current therapies.

## 2012-01-02 NOTE — Progress Notes (Signed)
  Echocardiogram 2D Echocardiogram has been performed.  Robert Arnold FRANCES 01/02/2012, 9:40 AM

## 2012-01-02 NOTE — Progress Notes (Addendum)
[facility]: Dr. 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   Underwent living-related kidney transplant at [facility] on 09/25/2011. Post day #1 had a NSTEMI. Cath revealed multivessel disease (details not available) . Underwent stenting of Xience Expedition LAD (DES). Developed AF and required DC-CV. Treated with coumadin for about a week and then stopped due to ureteral bleeding which required a stent. Echo showed EF 20% with global HK. Transferred to rehab for 11 days. D/c'd July 11. Going to Neuro rehab on outpatient basis for balance/stamina issues.   Admitted last week for chest pain that awoke him at night.  Described as pain with each heart beat.  Improved with walking.  CE negative, no further work up.  He returns for follow up today.  He feels well.  No further chest pain.  Has dyspnea with exertion at 2500 feet per neuro rehab.  Is planning to enroll in cardiac rehab now.  He denies swelling, orthopnea, or PND.    Echo 9/26: EF 50-55%, mid to distal inferior wall mild hypokinesis   ROS: All systems negative except as listed in HPI, PMH and Problem List.  Past Medical History  Diagnosis Date  . Diabetes mellitus type 2, controlled   . Hyperlipidemia   . BPH (benign prostatic hyperplasia)   . Gout   . CAD (coronary artery disease)     s/p post-op NSTEMI. treated with stent to LAD. at [facility]  . PAF (paroxysmal atrial fibrillation)     s/p DC-CV in 6/13. Off coumadin due to ureteral bleed  . OSA (obstructive sleep apnea)   . 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 HTN (hypertension)   . MI (myocardial infarction) 09/26/2011  . Chronic kidney disease     due IgA nephropathy - s/p kidnet transplant 09/25/11    Current Outpatient Prescriptions  Medication Sig Dispense Refill  . aspirin EC 81 MG tablet Take 81 mg by mouth  daily.      Marland Kitchen buPROPion (WELLBUTRIN XL) 300 MG 24 hr tablet Take 300 mg by mouth daily.       . carvedilol (COREG) 3.125 MG tablet Take 3.125 mg by mouth 2 (two) times daily.       Marland Kitchen CITALOPRAM HYDROBROMIDE PO Take 20 mg by mouth daily.       . clopidogrel (PLAVIX) 75 MG tablet Take 75 mg by mouth daily.       . fenofibrate micronized (LOFIBRA) 134 MG capsule Take 134 mg by mouth daily.       . finasteride (PROSCAR) 5 MG tablet Take 5 mg by mouth daily.       . insulin glargine (LANTUS) 100 UNIT/ML injection Inject 10 Units into the skin 2 (two) times daily. 13 units q am and 10 units  q pm      . itraconazole (SPORANOX) 100 MG capsule Take 200 mg by mouth daily. 2 tab      . metFORMIN (GLUCOPHAGE) 500 MG tablet Take by mouth 3 (three) times daily.       . Omega-3 Fatty Acids (FISH OIL) 1200 MG CAPS Take 1 capsule by mouth 2 (two) times daily.      Marland Kitchen PHOSPHA 250 NEUTRAL 155-852-130 MG tablet Take 2 tablets by mouth 2 (two) times daily.       . predniSONE (DELTASONE) 10 MG tablet Take 10 mg  by mouth daily.      Marland Kitchen PROGRAF 0.5 MG capsule Take 1 mg by mouth 2 (two) times daily.       . rosuvastatin (CRESTOR) 40 MG tablet Take 20 mg by mouth daily.        . sitaGLIPtin (JANUVIA) 25 MG tablet Take 25 mg by mouth daily.      Marland Kitchen sulfamethoxazole-trimethoprim (BACTRIM,SEPTRA) 400-80 MG per tablet Take 1 tablet by mouth every Monday, Wednesday, and Friday.       . tacrolimus (PROGRAF) 1 MG capsule Take 1 mg by mouth 2 (two) times daily.      . TRAVATAN Z 0.004 % SOLN ophthalmic solution Place 1 drop into both eyes at bedtime.       Marland Kitchen VALCYTE 450 MG tablet Take 450 mg by mouth daily.       . Filgrastim (NEUPOGEN IJ) Inject 1 each as directed once. For low blood counts Kidney Transplant patient Course is 1 prefilled syringe Thursday Friday and Saturday Managed by [employer] in [place]      . MYFORTIC 360 MG TBEC 360 mg 2 (two) times daily.          PHYSICAL EXAM: Filed Vitals:   01/02/12 1132   BP: 122/68  Pulse: 77  Height: 5\' 8"  (1.727 m)  Weight: 181 lb 12.8 oz (82.464 kg)  SpO2: 99%    General:  Well appearing. No resp difficulty HEENT: normal Neck: supple. JVP flat. Carotids 2+ bilaterally; no bruits. No lymphadenopathy or thryomegaly appreciated. Cor: PMI normal. Regular rate & rhythm. 2/6 systolic murmer.   Lungs: clear Abdomen: soft, nontender, nondistended. No hepatosplenomegaly. No bruits or masses. Good bowel sounds. Extremities: no cyanosis, clubbing, rash, edema Neuro: alert & orientedx3, cranial nerves grossly intact. Moves all 4 extremities w/o difficulty. Affect pleasant.    ASSESSMENT & PLAN:   Agree. See my note for full details.   Ceanna Wareing,MD 4:17 PM

## 2012-01-02 NOTE — Assessment & Plan Note (Addendum)
Chest pain last week with typical and atypical features. Cardiac enzymes and ECG negative in hospital. Echo with question of very mild hypokinesis in distal inferior wall. Will obtain cath report from [facility].  Schedule stress myoview to evaluate further given recent stent placement.  If high risk will need catheterization. Otherwise would like to avoid cath in setting of recent kidney transplant.

## 2012-01-02 NOTE — Patient Instructions (Addendum)
.  Your physician recommends that you schedule a follow-up appointment in: 1 month  Your physician has requested that you have a stress echocardiogram. For further information please visit [email or web address]. Please follow instruction sheet as given.

## 2012-01-04 NOTE — Progress Notes (Signed)
[facility]: Dr. 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   Underwent living-related kidney transplant at [facility] on 09/25/2011. Post day #1 had a NSTEMI. Cath revealed multivessel disease (details not available) . Underwent stenting of Xience Expedition LAD (DES). Developed AF and required DC-CV. Treated with coumadin for about a week and then stopped due to ureteral bleeding which required a stent. Echo showed EF 20% with global HK. Transferred to rehab for 11 days. D/c'd July 11. Going to Neuro rehab on outpatient basis for balance/stamina issues.   Admitted last week for chest pain that awoke him at night.  Described as pain with each heart beat.  Improved with walking.  CE negative, no further work up.  He returns for follow up today.  He feels well.  No further chest pain.  Has dyspnea with exertion at 2500 feet per neuro rehab.  Is planning to enroll in cardiac rehab now.  He denies swelling, orthopnea, or PND.    Echo 9/26: EF 50-55%, with question of very mild mid to distal inferior wall hypokinesis   ROS: All systems negative except as listed in HPI, PMH and Problem List.  Past Medical History  Diagnosis Date  . Diabetes mellitus type 2, controlled   . Hyperlipidemia   . BPH (benign prostatic hyperplasia)   . Gout   . CAD (coronary artery disease)     s/p post-op NSTEMI. treated with stent to LAD. at [facility]  . PAF (paroxysmal atrial fibrillation)     s/p DC-CV in 6/13. Off coumadin due to ureteral bleed  . OSA (obstructive sleep apnea)   . 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 HTN (hypertension)   . MI (myocardial infarction) 09/26/2011  . Chronic kidney disease     due IgA nephropathy - s/p kidnet transplant 09/25/11    Current Outpatient Prescriptions  Medication Sig Dispense Refill  . aspirin EC 81 MG tablet  Take 81 mg by mouth daily.      Marland Kitchen buPROPion (WELLBUTRIN XL) 300 MG 24 hr tablet Take 300 mg by mouth daily.       . carvedilol (COREG) 3.125 MG tablet Take 3.125 mg by mouth 2 (two) times daily.       Marland Kitchen CITALOPRAM HYDROBROMIDE PO Take 20 mg by mouth daily.       . clopidogrel (PLAVIX) 75 MG tablet Take 75 mg by mouth daily.       . fenofibrate micronized (LOFIBRA) 134 MG capsule Take 134 mg by mouth daily.       . finasteride (PROSCAR) 5 MG tablet Take 5 mg by mouth daily.       . insulin glargine (LANTUS) 100 UNIT/ML injection Inject 10 Units into the skin 2 (two) times daily. 13 units q am and 10 units  q pm      . itraconazole (SPORANOX) 100 MG capsule Take 200 mg by mouth daily. 2 tab      . metFORMIN (GLUCOPHAGE) 500 MG tablet Take by mouth 3 (three) times daily.       . Omega-3 Fatty Acids (FISH OIL) 1200 MG CAPS Take 1 capsule by mouth 2 (two) times daily.      Marland Kitchen PHOSPHA 250 NEUTRAL 155-852-130 MG tablet Take 2 tablets by mouth 2 (two) times daily.       . predniSONE (DELTASONE) 10 MG  tablet Take 10 mg by mouth daily.      Marland Kitchen PROGRAF 0.5 MG capsule Take 1 mg by mouth 2 (two) times daily.       . rosuvastatin (CRESTOR) 40 MG tablet Take 20 mg by mouth daily.        . sitaGLIPtin (JANUVIA) 25 MG tablet Take 25 mg by mouth daily.      Marland Kitchen sulfamethoxazole-trimethoprim (BACTRIM,SEPTRA) 400-80 MG per tablet Take 1 tablet by mouth every Monday, Wednesday, and Friday.       . tacrolimus (PROGRAF) 1 MG capsule Take 1 mg by mouth 2 (two) times daily.      . TRAVATAN Z 0.004 % SOLN ophthalmic solution Place 1 drop into both eyes at bedtime.       Marland Kitchen VALCYTE 450 MG tablet Take 450 mg by mouth daily.       . Filgrastim (NEUPOGEN IJ) Inject 1 each as directed once. For low blood counts Kidney Transplant patient Course is 1 prefilled syringe Thursday Friday and Saturday Managed by [employer] in [place]      . MYFORTIC 360 MG TBEC 360 mg 2 (two) times daily.       . nitroGLYCERIN (NITROSTAT) 0.4  MG SL tablet Place 1 tablet (0.4 mg total) under the tongue every 5 (five) minutes as needed for chest pain.  30 tablet  12     PHYSICAL EXAM: Filed Vitals:   01/02/12 1132  BP: 122/68  Pulse: 77  Height: 5\' 8"  (1.727 m)  Weight: 181 lb 12.8 oz (82.464 kg)  SpO2: 99%    General:  Well appearing. No resp difficulty HEENT: normal Neck: supple. JVP flat. Carotids 2+ bilaterally; no bruits. No lymphadenopathy or thryomegaly appreciated. Cor: PMI normal. Regular rate & rhythm. 2/6 systolic murmer.   Lungs: clear Abdomen: soft, nontender, nondistended. No hepatosplenomegaly. No bruits or masses. Good bowel sounds. Extremities: no cyanosis, clubbing, rash, edema Neuro: alert & orientedx3, cranial nerves grossly intact. Moves all 4 extremities w/o difficulty. Affect pleasant.    ASSESSMENT & PLAN:

## 2012-01-07 ENCOUNTER — Telehealth (HOSPITAL_COMMUNITY): Payer: Self-pay | Admitting: *Deleted

## 2012-01-07 ENCOUNTER — Ambulatory Visit: Payer: Medicare Other | Attending: Internal Medicine | Admitting: Occupational Therapy

## 2012-01-07 DIAGNOSIS — R5381 Other malaise: Secondary | ICD-10-CM | POA: Insufficient documentation

## 2012-01-07 DIAGNOSIS — I252 Old myocardial infarction: Secondary | ICD-10-CM | POA: Insufficient documentation

## 2012-01-07 DIAGNOSIS — Z940 Kidney transplant status: Secondary | ICD-10-CM | POA: Insufficient documentation

## 2012-01-07 DIAGNOSIS — IMO0001 Reserved for inherently not codable concepts without codable children: Secondary | ICD-10-CM | POA: Insufficient documentation

## 2012-01-07 DIAGNOSIS — R4189 Other symptoms and signs involving cognitive functions and awareness: Secondary | ICD-10-CM | POA: Insufficient documentation

## 2012-01-07 DIAGNOSIS — R279 Unspecified lack of coordination: Secondary | ICD-10-CM | POA: Insufficient documentation

## 2012-01-07 NOTE — Telephone Encounter (Signed)
Pt states his [facility] is not wanting to cover his cardiac rehab because his MI was more than 90 days ago, he has spoken with them and needs Dr Haroldine Laws to compose a letter to them stating the reason why his cardiac rehab has been delayed, will have Dr Haroldine Laws compose letter and mail it to [facility] at [employer] [id number] [place] [address]

## 2012-01-08 ENCOUNTER — Ambulatory Visit (HOSPITAL_COMMUNITY): Payer: Medicare Other | Attending: Cardiovascular Disease | Admitting: Radiology

## 2012-01-08 ENCOUNTER — Encounter: Payer: BC Managed Care – PPO | Admitting: Occupational Therapy

## 2012-01-08 VITALS — BP 132/70 | HR 65 | Ht 68.0 in | Wt 180.0 lb

## 2012-01-08 DIAGNOSIS — I10 Essential (primary) hypertension: Secondary | ICD-10-CM | POA: Insufficient documentation

## 2012-01-08 DIAGNOSIS — E119 Type 2 diabetes mellitus without complications: Secondary | ICD-10-CM | POA: Insufficient documentation

## 2012-01-08 DIAGNOSIS — R0989 Other specified symptoms and signs involving the circulatory and respiratory systems: Secondary | ICD-10-CM | POA: Insufficient documentation

## 2012-01-08 DIAGNOSIS — I4891 Unspecified atrial fibrillation: Secondary | ICD-10-CM | POA: Insufficient documentation

## 2012-01-08 DIAGNOSIS — R079 Chest pain, unspecified: Secondary | ICD-10-CM | POA: Insufficient documentation

## 2012-01-08 DIAGNOSIS — R0609 Other forms of dyspnea: Secondary | ICD-10-CM | POA: Insufficient documentation

## 2012-01-08 DIAGNOSIS — R0602 Shortness of breath: Secondary | ICD-10-CM

## 2012-01-08 DIAGNOSIS — I200 Unstable angina: Secondary | ICD-10-CM

## 2012-01-08 DIAGNOSIS — I2510 Atherosclerotic heart disease of native coronary artery without angina pectoris: Secondary | ICD-10-CM

## 2012-01-08 DIAGNOSIS — I5022 Chronic systolic (congestive) heart failure: Secondary | ICD-10-CM

## 2012-01-08 MED ORDER — TECHNETIUM TC 99M SESTAMIBI GENERIC - CARDIOLITE
10.0000 | Freq: Once | INTRAVENOUS | Status: AC | PRN
  Administered 2012-01-08: 10 via INTRAVENOUS

## 2012-01-08 MED ORDER — TECHNETIUM TC 99M SESTAMIBI GENERIC - CARDIOLITE
30.0000 | Freq: Once | INTRAVENOUS | Status: AC | PRN
  Administered 2012-01-08: 30 via INTRAVENOUS

## 2012-01-08 MED ORDER — REGADENOSON 0.4 MG/5ML IV SOLN
0.4000 mg | Freq: Once | INTRAVENOUS | Status: AC
  Administered 2012-01-08: 0.4 mg via INTRAVENOUS

## 2012-01-08 NOTE — Progress Notes (Signed)
[facility] NUCLEAR MED [address] [id number] [place] [address] [phone]  Cardiology Nuclear Med Study  Robert Arnold is a 66 y.o. male     MRN : [id number]     DOB: 13-Sep-1945  Procedure Date: 01/08/2012  Nuclear Med Background Indication for Stress Test:  Evaluation for Ischemia, Stent Patency and Post ED on 12/28/11 with CP, negative enzymes History:  '10 [id number]; 6/13 NSTEMI>Stent-LAD>Cardioversion for afib; 01/02/12 Echo:EF=55%   Cardiac Risk Factors: History of Smoking, Hypertension, IDDM Type 2 and Lipids  Symptoms:  Chest Pain (last episode of chest discomfort:has been none since discharge) and DOE   Nuclear Pre-Procedure Caffeine/Decaff Intake:  None NPO After: 7:30pm   Lungs:  Clear. O2 Sat: 98% on room air. IV 0.9% NS with Angio Cath:  20g  IV Site: R Antecubital  IV Started by:  Crissie Figures, RN  Chest Size (in):  44 Cup Size: n/a  Height: 5\' 8"  (1.727 m)  Weight:  180 lb (81.647 kg)  BMI:  Body mass index is 27.37 kg/(m^2). Tech Comments:  BP/IV Right arm only    Nuclear Med Study 1 or 2 day study: 1 day  Stress Test Type:  Stress  Reading MD: Mertie Moores, MD  Order Authorizing Provider:  Glori Bickers, MD  Resting Radionuclide: Technetium 67m Sestamibi  Resting Radionuclide Dose: 11.0 mCi   Stress Radionuclide:  Technetium 59m Sestamibi  Stress Radionuclide Dose: 32.8 mCi           Stress Protocol Rest HR: 65 Stress HR: 107  Rest BP: 132/70 Stress BP: 172/71  Exercise Time (min): 7:01 METS: 6.4   Predicted Max HR: 157 bpm % Max HR: 68.15 bpm Rate Pressure Product: 18404   Dose of Adenosine (mg):  n/a Dose of Lexiscan: 0.4 mg  Dose of Atropine (mg): n/a Dose of Dobutamine: n/a mcg/kg/min (at max HR)  Stress Test Technologist: Letta Moynahan, CMA-N  Nuclear Technologist:  Charlton Amor, CNMT     Rest Procedure:  Myocardial perfusion imaging was performed at rest 45 minutes following the intravenous  administration of Technetium 6m Tetrofosmin.  Rest ECG: Nonspecific ST-T wave changes, 1st degree AVB with occasional PVC's.  Stress Procedure: The patient initially walked the treadmill for 5:00, utilizing the Bruce protocol, but was unable to reach his target heart rate due to significant dyspnea.  He was then given IV Lexiscan 0.4 mg over 15-seconds with concurrent low level exercise and then Technetium 36m Sestamibi was injected at 30-seconds while the patient continued walking one more minute. There were no diagnostic ST-T wave changes with Lexiscan. Quantitative spect images were obtained after a 45-minute delay.  Images were discussed with Nira Conn, Dr. Clayborne Dana nurse.  Dr. Haroldine Laws will call patient for follow-up.  Stress ECG: Mild ST depression in the anterior lateral leads  QPS Raw Data Images:  Normal; no motion artifact; normal heart/lung ratio. Stress Images:  There is a large moderately severe defect in the anterior lateral wall.  The uptake in the remaining segmemts is normal. Rest Images:  There is a large moderately severe defect in the anterior lateral wall.  The uptake in the remaining walls is fairly normal. Subtraction (SDS):  There is evidence of a previous large anterior lateral MI with a moderate amount of peri-infarct ischemia Transient Ischemic Dilatation (Normal <1.22):  1.04 Lung/Heart Ratio (Normal <0.45):  0.37  Quantitative Gated Spect Images QGS EDV:  166 ml QGS ESV:  76 ml  Impression Exercise Capacity:  Lexiscan with low level exercise. BP Response:  Normal blood pressure response. Clinical Symptoms:  No significant symptoms noted. ECG Impression:  There is ST depression in the anterior lateral leads. Comparison with Prior Nuclear Study: No images to compare  Overall Impression:  Abnormal stress nuclear study.  He has evidence of a previous large anterior lateral MI with a small - moderate amount of peri-infarct ischemia.   LV Ejection Fraction:  54%.  LV Wall Motion:  The overall OV function is at the lower limits of normal.  There is mild hypokinesis of the anterior lateral  wall.    Thayer Headings, Brooke Bonito., MD, [facility] 01/08/2012, 5:12 PM Office - [phone] Pager [phone]

## 2012-01-09 ENCOUNTER — Ambulatory Visit: Payer: Medicare Other | Admitting: Occupational Therapy

## 2012-01-09 ENCOUNTER — Encounter: Payer: BC Managed Care – PPO | Admitting: Occupational Therapy

## 2012-01-13 ENCOUNTER — Encounter (HOSPITAL_COMMUNITY): Payer: Self-pay | Admitting: *Deleted

## 2012-01-13 ENCOUNTER — Ambulatory Visit (HOSPITAL_COMMUNITY)
Admission: RE | Admit: 2012-01-13 | Discharge: 2012-01-13 | Disposition: A | Discharge status: Home / Self Care | Payer: Medicare Other | Source: Ambulatory Visit | Attending: Internal Medicine | Admitting: Internal Medicine

## 2012-01-13 ENCOUNTER — Encounter (HOSPITAL_COMMUNITY): Payer: Self-pay

## 2012-01-13 ENCOUNTER — Ambulatory Visit: Payer: Medicare Other | Admitting: Occupational Therapy

## 2012-01-13 ENCOUNTER — Other Ambulatory Visit (HOSPITAL_COMMUNITY): Payer: Self-pay | Admitting: Physician Assistant

## 2012-01-13 VITALS — BP 126/64 | HR 75 | Ht 68.0 in | Wt 182.8 lb

## 2012-01-13 DIAGNOSIS — I2510 Atherosclerotic heart disease of native coronary artery without angina pectoris: Secondary | ICD-10-CM

## 2012-01-13 DIAGNOSIS — R079 Chest pain, unspecified: Secondary | ICD-10-CM

## 2012-01-13 LAB — CBC
HCT: 37.9 % — ABNORMAL LOW (ref 39.0–52.0)
MCH: 32.4 pg (ref 26.0–34.0)
MCHC: 32.7 g/dL (ref 30.0–36.0)
MCV: 99 fL (ref 78.0–100.0)
Platelets: 137 10*3/uL — ABNORMAL LOW (ref 150–400)
RDW: 14.1 % (ref 11.5–15.5)

## 2012-01-13 LAB — BASIC METABOLIC PANEL
CO2: 27 mEq/L (ref 19–32)
Chloride: 104 mEq/L (ref 96–112)
Glucose, Bld: 301 mg/dL — ABNORMAL HIGH (ref 70–99)
Sodium: 141 mEq/L (ref 135–145)

## 2012-01-13 NOTE — Assessment & Plan Note (Signed)
We reviewed stress test images together. He continues with exertional dyspnea at low level exercise after recent anterior NSTEMI. Stress test shows previous infarct with moderate peri-infarct ischemia. Given ongoing symptoms he will need to go back to the cath lab for angiography to ensure stent patency and re-evaluate his coronaries. Continue Plavix.

## 2012-01-13 NOTE — Patient Instructions (Addendum)

## 2012-01-13 NOTE — Progress Notes (Signed)
[facility]: Dr. 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   Underwent living-related kidney transplant at [facility] on 09/25/2011. Post day #1 had a NSTEMI. Cath revealed multivessel disease (details not available) . Underwent stenting of Xience Expedition LAD (DES). Developed AF and required DC-CV. Treated with coumadin for about a week and then stopped due to ureteral bleeding which required a stent. Echo showed EF 20% with global HK. Transferred to rehab for 11 days. D/c'd July 11. Going to Neuro rehab on outpatient basis for balance/stamina issues.   Echo 9/26: EF 50-55%, with question of very mild mid to distal inferior wall hypokinesis. Mild AS.  Admitted two weeks ago for chest pain that awoke him at night. ECG and CE negative. Last week had some exertional dyspnea so Myoview ordered. Walked 5:00 mins on [place]. Extremely short of breath. No ECG change. Switched to [employer]. Images: EF 54%. Evidence of a previous large anterior lateral MI with a small - moderate amount of peri-infarct ischemia.   Continue with dyspnea on mild exertion. No CP. No edema, orthopnea or PND.     ROS: All systems negative except as listed in HPI, PMH and Problem List.  Past Medical History  Diagnosis Date  . Diabetes mellitus type 2, controlled   . Hyperlipidemia   . BPH (benign prostatic hyperplasia)   . Gout   . CAD (coronary artery disease)     s/p post-op NSTEMI. treated with stent to LAD. at [facility]  . PAF (paroxysmal atrial fibrillation)     s/p DC-CV in 6/13. Off coumadin due to ureteral bleed  . OSA (obstructive sleep apnea)   . 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 HTN (hypertension)   . MI (myocardial infarction) 09/26/2011  . Chronic kidney disease     due IgA nephropathy - s/p kidnet transplant 09/25/11    Current Outpatient  Prescriptions  Medication Sig Dispense Refill  . aspirin EC 81 MG tablet Take 81 mg by mouth daily.      Marland Kitchen buPROPion (WELLBUTRIN XL) 300 MG 24 hr tablet Take 300 mg by mouth daily.       . carvedilol (COREG) 3.125 MG tablet Take 3.125 mg by mouth 2 (two) times daily.       Marland Kitchen CITALOPRAM HYDROBROMIDE PO Take 20 mg by mouth daily.       . clopidogrel (PLAVIX) 75 MG tablet Take 75 mg by mouth daily.       . fenofibrate micronized (LOFIBRA) 134 MG capsule Take 134 mg by mouth daily.       . Filgrastim (NEUPOGEN IJ) Inject 1 each as directed once. For low blood counts Kidney Transplant patient Course is 1 prefilled syringe Thursday Friday and Saturday Managed by [employer] in [place]      . finasteride (PROSCAR) 5 MG tablet Take 5 mg by mouth daily.       . insulin glargine (LANTUS) 100 UNIT/ML injection Inject 10 Units into the skin 2 (two) times daily. 13 units q am and 10 units  q pm      . itraconazole (SPORANOX) 100 MG capsule Take 200 mg by mouth daily. 2 tab      . metFORMIN (GLUCOPHAGE) 500 MG tablet Take by mouth 3 (three) times daily.       Marland Kitchen MYFORTIC 360 MG TBEC 360 mg every morning.       Marland Kitchen  nitroGLYCERIN (NITROSTAT) 0.4 MG SL tablet Place 1 tablet (0.4 mg total) under the tongue every 5 (five) minutes as needed for chest pain.  30 tablet  12  . Omega-3 Fatty Acids (FISH OIL) 1200 MG CAPS Take 1 capsule by mouth 2 (two) times daily.      Marland Kitchen PHOSPHA 250 NEUTRAL 155-852-130 MG tablet Take 2 tablets by mouth 2 (two) times daily.       . predniSONE (DELTASONE) 10 MG tablet Take 10 mg by mouth daily.      Marland Kitchen PROGRAF 0.5 MG capsule Take 1 mg by mouth 2 (two) times daily.       . rosuvastatin (CRESTOR) 40 MG tablet Take 20 mg by mouth daily.        . sitaGLIPtin (JANUVIA) 25 MG tablet Take 25 mg by mouth daily.      Marland Kitchen sulfamethoxazole-trimethoprim (BACTRIM,SEPTRA) 400-80 MG per tablet Take 1 tablet by mouth every Monday, Wednesday, and Friday.       . tacrolimus (PROGRAF) 1 MG capsule  Take 1 mg by mouth 2 (two) times daily.      . TRAVATAN Z 0.004 % SOLN ophthalmic solution Place 1 drop into both eyes at bedtime.       Marland Kitchen VALCYTE 450 MG tablet Take 450 mg by mouth daily.          PHYSICAL EXAM: Filed Vitals:   01/13/12 1607  BP: 126/64  Pulse: 75  Height: 5\' 8"  (1.727 m)  Weight: 182 lb 12.8 oz (82.918 kg)  SpO2: 99%    General:  Well appearing. No resp difficulty HEENT: normal Neck: supple. JVP flat. Carotids 2+ bilaterally; + bilat radiated bruits. No lymphadenopathy or thryomegaly appreciated. Cor: PMI normal. Regular rate & rhythm. 2/6 AS Lungs: clear Abdomen: soft, nontender, nondistended. No hepatosplenomegaly. No bruits or masses. Good bowel sounds. Extremities: no cyanosis, clubbing, rash, edema Neuro: alert & orientedx3, cranial nerves grossly intact. Moves all 4 extremities w/o difficulty. Affect pleasant.    ASSESSMENT & PLAN:

## 2012-01-13 NOTE — Addendum Note (Signed)
Encounter addended by: Scarlette Calico, RN on: 01/13/2012  4:45 PM<BR>     Documentation filed: Patient Instructions Section

## 2012-01-14 ENCOUNTER — Inpatient Hospital Stay (HOSPITAL_BASED_OUTPATIENT_CLINIC_OR_DEPARTMENT_OTHER)
Admission: RE | Admit: 2012-01-14 | Discharge: 2012-01-14 | Disposition: A | Discharge status: Home / Self Care | Payer: Medicare Other | Source: Ambulatory Visit | Attending: Internal Medicine | Admitting: Internal Medicine

## 2012-01-14 ENCOUNTER — Encounter (HOSPITAL_BASED_OUTPATIENT_CLINIC_OR_DEPARTMENT_OTHER)
Admission: RE | Disposition: A | Discharge status: Home / Self Care | Payer: Self-pay | Source: Ambulatory Visit | Attending: Internal Medicine

## 2012-01-14 DIAGNOSIS — E119 Type 2 diabetes mellitus without complications: Secondary | ICD-10-CM | POA: Insufficient documentation

## 2012-01-14 DIAGNOSIS — I10 Essential (primary) hypertension: Secondary | ICD-10-CM | POA: Insufficient documentation

## 2012-01-14 DIAGNOSIS — I359 Nonrheumatic aortic valve disorder, unspecified: Secondary | ICD-10-CM | POA: Insufficient documentation

## 2012-01-14 DIAGNOSIS — Z9861 Coronary angioplasty status: Secondary | ICD-10-CM | POA: Insufficient documentation

## 2012-01-14 DIAGNOSIS — I2510 Atherosclerotic heart disease of native coronary artery without angina pectoris: Secondary | ICD-10-CM

## 2012-01-14 DIAGNOSIS — E785 Hyperlipidemia, unspecified: Secondary | ICD-10-CM | POA: Insufficient documentation

## 2012-01-14 DIAGNOSIS — I4891 Unspecified atrial fibrillation: Secondary | ICD-10-CM | POA: Insufficient documentation

## 2012-01-14 DIAGNOSIS — I252 Old myocardial infarction: Secondary | ICD-10-CM | POA: Insufficient documentation

## 2012-01-14 DIAGNOSIS — B399 Histoplasmosis, unspecified: Secondary | ICD-10-CM | POA: Insufficient documentation

## 2012-01-14 DIAGNOSIS — Z940 Kidney transplant status: Secondary | ICD-10-CM | POA: Insufficient documentation

## 2012-01-14 DIAGNOSIS — R0609 Other forms of dyspnea: Secondary | ICD-10-CM | POA: Insufficient documentation

## 2012-01-14 DIAGNOSIS — G4733 Obstructive sleep apnea (adult) (pediatric): Secondary | ICD-10-CM | POA: Insufficient documentation

## 2012-01-14 DIAGNOSIS — R079 Chest pain, unspecified: Secondary | ICD-10-CM

## 2012-01-14 DIAGNOSIS — R0989 Other specified symptoms and signs involving the circulatory and respiratory systems: Secondary | ICD-10-CM | POA: Insufficient documentation

## 2012-01-14 SURGERY — JV LEFT HEART CATHETERIZATION WITH CORONARY ANGIOGRAM
Anesthesia: LOCAL

## 2012-01-14 MED ORDER — ASPIRIN 81 MG PO CHEW
324.0000 mg | CHEWABLE_TABLET | ORAL | Status: AC
  Administered 2012-01-14: 324 mg via ORAL

## 2012-01-14 MED ORDER — SODIUM CHLORIDE 0.9 % IV SOLN
250.0000 mL | INTRAVENOUS | Status: DC | PRN
  Administered 2012-01-14: 250 mL via INTRAVENOUS

## 2012-01-14 MED ORDER — SODIUM CHLORIDE 0.9 % IJ SOLN: 3.0000 mL | INTRAMUSCULAR | Status: DC | PRN

## 2012-01-14 MED ORDER — ONDANSETRON HCL 4 MG/2ML IJ SOLN: 4.0000 mg | Freq: Four times a day (QID) | INTRAMUSCULAR | Status: DC | PRN

## 2012-01-14 MED ORDER — SODIUM CHLORIDE 0.9 % IJ SOLN: 3.0000 mL | Freq: Two times a day (BID) | INTRAMUSCULAR | Status: DC

## 2012-01-14 MED ORDER — ACETAMINOPHEN 325 MG PO TABS: 650.0000 mg | ORAL_TABLET | ORAL | Status: DC | PRN

## 2012-01-14 MED ORDER — SODIUM CHLORIDE 0.9 % IV SOLN: INTRAVENOUS | Status: AC

## 2012-01-14 NOTE — Telephone Encounter (Signed)
This has been placed on hold as pt has been cathed and will need bypass surgery

## 2012-01-14 NOTE — H&P (View-Only) (Signed)
[facility]: Dr. 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   Underwent living-related kidney transplant at [facility] on 09/25/2011. Post day #1 had a NSTEMI. Cath revealed multivessel disease (details not available) . Underwent stenting of Xience Expedition LAD (DES). Developed AF and required DC-CV. Treated with coumadin for about a week and then stopped due to ureteral bleeding which required a stent. Echo showed EF 20% with global HK. Transferred to rehab for 11 days. D/c'd July 11. Going to Neuro rehab on outpatient basis for balance/stamina issues.   Echo 9/26: EF 50-55%, with question of very mild mid to distal inferior wall hypokinesis. Mild AS.  Admitted two weeks ago for chest pain that awoke him at night. ECG and CE negative. Last week had some exertional dyspnea so Myoview ordered. Walked 5:00 mins on [place]. Extremely short of breath. No ECG change. Switched to [employer]. Images: EF 54%. Evidence of a previous large anterior lateral MI with a small - moderate amount of peri-infarct ischemia.   Continue with dyspnea on mild exertion. No CP. No edema, orthopnea or PND.     ROS: All systems negative except as listed in HPI, PMH and Problem List.  Past Medical History  Diagnosis Date  . Diabetes mellitus type 2, controlled   . Hyperlipidemia   . BPH (benign prostatic hyperplasia)   . Gout   . CAD (coronary artery disease)     s/p post-op NSTEMI. treated with stent to LAD. at [facility]  . PAF (paroxysmal atrial fibrillation)     s/p DC-CV in 6/13. Off coumadin due to ureteral bleed  . OSA (obstructive sleep apnea)   . 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 HTN (hypertension)   . MI (myocardial infarction) 09/26/2011  . Chronic kidney disease     due IgA nephropathy - s/p kidnet transplant 09/25/11    Current Outpatient  Prescriptions  Medication Sig Dispense Refill  . aspirin EC 81 MG tablet Take 81 mg by mouth daily.      Marland Kitchen buPROPion (WELLBUTRIN XL) 300 MG 24 hr tablet Take 300 mg by mouth daily.       . carvedilol (COREG) 3.125 MG tablet Take 3.125 mg by mouth 2 (two) times daily.       Marland Kitchen CITALOPRAM HYDROBROMIDE PO Take 20 mg by mouth daily.       . clopidogrel (PLAVIX) 75 MG tablet Take 75 mg by mouth daily.       . fenofibrate micronized (LOFIBRA) 134 MG capsule Take 134 mg by mouth daily.       . Filgrastim (NEUPOGEN IJ) Inject 1 each as directed once. For low blood counts Kidney Transplant patient Course is 1 prefilled syringe Thursday Friday and Saturday Managed by [employer] in [place]      . finasteride (PROSCAR) 5 MG tablet Take 5 mg by mouth daily.       . insulin glargine (LANTUS) 100 UNIT/ML injection Inject 10 Units into the skin 2 (two) times daily. 13 units q am and 10 units  q pm      . itraconazole (SPORANOX) 100 MG capsule Take 200 mg by mouth daily. 2 tab      . metFORMIN (GLUCOPHAGE) 500 MG tablet Take by mouth 3 (three) times daily.       Marland Kitchen MYFORTIC 360 MG TBEC 360 mg every morning.       Marland Kitchen  nitroGLYCERIN (NITROSTAT) 0.4 MG SL tablet Place 1 tablet (0.4 mg total) under the tongue every 5 (five) minutes as needed for chest pain.  30 tablet  12  . Omega-3 Fatty Acids (FISH OIL) 1200 MG CAPS Take 1 capsule by mouth 2 (two) times daily.      Marland Kitchen PHOSPHA 250 NEUTRAL 155-852-130 MG tablet Take 2 tablets by mouth 2 (two) times daily.       . predniSONE (DELTASONE) 10 MG tablet Take 10 mg by mouth daily.      Marland Kitchen PROGRAF 0.5 MG capsule Take 1 mg by mouth 2 (two) times daily.       . rosuvastatin (CRESTOR) 40 MG tablet Take 20 mg by mouth daily.        . sitaGLIPtin (JANUVIA) 25 MG tablet Take 25 mg by mouth daily.      Marland Kitchen sulfamethoxazole-trimethoprim (BACTRIM,SEPTRA) 400-80 MG per tablet Take 1 tablet by mouth every Monday, Wednesday, and Friday.       . tacrolimus (PROGRAF) 1 MG capsule  Take 1 mg by mouth 2 (two) times daily.      . TRAVATAN Z 0.004 % SOLN ophthalmic solution Place 1 drop into both eyes at bedtime.       Marland Kitchen VALCYTE 450 MG tablet Take 450 mg by mouth daily.          PHYSICAL EXAM: Filed Vitals:   01/13/12 1607  BP: 126/64  Pulse: 75  Height: 5\' 8"  (1.727 m)  Weight: 182 lb 12.8 oz (82.918 kg)  SpO2: 99%    General:  Well appearing. No resp difficulty HEENT: normal Neck: supple. JVP flat. Carotids 2+ bilaterally; + bilat radiated bruits. No lymphadenopathy or thryomegaly appreciated. Cor: PMI normal. Regular rate & rhythm. 2/6 AS Lungs: clear Abdomen: soft, nontender, nondistended. No hepatosplenomegaly. No bruits or masses. Good bowel sounds. Extremities: no cyanosis, clubbing, rash, edema Neuro: alert & orientedx3, cranial nerves grossly intact. Moves all 4 extremities w/o difficulty. Affect pleasant.    ASSESSMENT & PLAN:

## 2012-01-14 NOTE — CV Procedure (Addendum)
Cardiac Cath Procedure Note:  Indication: Chest pain, dyspnea positive stress test  Procedures performed:  1) Selective coronary angiography 2) Left heart catheterization   Description of procedure:   The risks and indication of the procedure were explained. Consent was signed and placed on the chart. An appropriate timeout was taken prior to the procedure. The right groin was prepped and draped in the routine sterile fashion and anesthetized with 1% local lidocaine.   A 4 FR arterial sheath was placed in the right femoral artery using a modified Seldinger technique. Standard catheters including a JL4, JR4 and angled pigtail were used. All catheter exchanges were made over a wire.  Total contrast 45 cc  Complications:  None apparent  Findings:  Ao Pressure: 116/62 (92) LV Pressure:123/13/19 Mild AS: Mean gradient 11   Left main: Ostial 30-40%  LAD: Heavily calcified. Stent in proximal section with mild in-stent plaque. 50-60% lesion prior to stent. 60-70% lesion after sten. Both heavily calcified. Distal vessel is good target.  LCX:  Heavily calcified. Gives off small OM-1. Moderate-sized OM-2. Tandem 80% lesions in mid AV groove, Followed by 95% lesion id mid to distal AV groove          Ramus: Long moderate sized vessel. 60% ostial. 99% mid  RCA: Dominant. Diffuse 50% proximal. 70-80% tubular lesion in midsection. 50% in distal RCA at take-off of PDA  LV-gram not done in order to limit contrast exposure: EF 54% by Myoview  Assessment: 1. Severe 3v calcific CAD with patent LAD stent 2. Mild AS 3. EF 54% by echo  Plan/Discussion:  He has severe multi-vessel calcific CAD. He is s/p recent renal transplant. I have reviewed images with Dr. Burt Knack and we both feel that CABG is his best option. Will refer to TCTS. I will also discuss with his nephrologist, Dr. Florene Glen. As well as his transplant doc in [place]. Start Imdur 30 for anginal control. Stop Plavix.  Tayllor Breitenstein 9:33 AM

## 2012-01-14 NOTE — Progress Notes (Signed)
Bedrest begins @ 0950, tegaderm dressing applied to right groin site.

## 2012-01-14 NOTE — Interval H&P Note (Signed)
History and Physical Interval Note:  01/14/2012 8:44 AM  Robert Arnold  has presented today for surgery, with the diagnosis of cp + positive stress test.  The various methods of treatment have been discussed with the patient and family. After consideration of risks, benefits and other options for treatment, the patient has consented to  Procedure(s) (LRB) with comments: JV LEFT HEART CATHETERIZATION WITH CORONARY ANGIOGRAM (N/A) as a surgical intervention .  The patient's history has been reviewed, patient examined, no change in status, stable for surgery.  I have reviewed the patient's chart and labs.  Questions were answered to the patient's satisfaction.     Daniel Bensimhon

## 2012-01-15 ENCOUNTER — Ambulatory Visit: Payer: Medicare Other | Admitting: Occupational Therapy

## 2012-01-15 ENCOUNTER — Encounter: Payer: BC Managed Care – PPO | Admitting: Surgery

## 2012-01-21 ENCOUNTER — Ambulatory Visit: Payer: Medicare Other | Admitting: Occupational Therapy

## 2012-01-22 ENCOUNTER — Encounter: Payer: BC Managed Care – PPO | Admitting: Occupational Therapy

## 2012-01-23 ENCOUNTER — Encounter: Payer: BC Managed Care – PPO | Admitting: Occupational Therapy

## 2012-01-23 ENCOUNTER — Ambulatory Visit (HOSPITAL_COMMUNITY): Payer: BC Managed Care – PPO

## 2012-01-24 ENCOUNTER — Telehealth (HOSPITAL_COMMUNITY): Payer: Self-pay | Admitting: *Deleted

## 2012-01-24 NOTE — Telephone Encounter (Signed)
Received request from [employer] for patients records for his [place] claim, there is a signed release form from patient, records were faxed to [place] at [phone]

## 2012-01-27 ENCOUNTER — Ambulatory Visit (HOSPITAL_COMMUNITY): Payer: BC Managed Care – PPO

## 2012-01-29 ENCOUNTER — Ambulatory Visit (HOSPITAL_COMMUNITY): Payer: BC Managed Care – PPO

## 2012-01-30 ENCOUNTER — Encounter (HOSPITAL_COMMUNITY): Payer: BC Managed Care – PPO

## 2012-01-31 ENCOUNTER — Ambulatory Visit (HOSPITAL_COMMUNITY): Payer: BC Managed Care – PPO

## 2012-02-03 ENCOUNTER — Ambulatory Visit (HOSPITAL_COMMUNITY): Payer: BC Managed Care – PPO

## 2012-02-05 ENCOUNTER — Ambulatory Visit (HOSPITAL_COMMUNITY): Payer: BC Managed Care – PPO

## 2012-02-07 ENCOUNTER — Ambulatory Visit (HOSPITAL_COMMUNITY): Payer: BC Managed Care – PPO

## 2012-02-10 ENCOUNTER — Ambulatory Visit (HOSPITAL_COMMUNITY): Payer: BC Managed Care – PPO

## 2012-02-12 ENCOUNTER — Ambulatory Visit (HOSPITAL_COMMUNITY): Payer: BC Managed Care – PPO

## 2012-02-14 ENCOUNTER — Ambulatory Visit (HOSPITAL_COMMUNITY): Payer: BC Managed Care – PPO

## 2012-02-17 ENCOUNTER — Ambulatory Visit (HOSPITAL_COMMUNITY): Payer: BC Managed Care – PPO

## 2012-02-19 ENCOUNTER — Ambulatory Visit (HOSPITAL_COMMUNITY): Payer: BC Managed Care – PPO

## 2012-02-20 NOTE — Progress Notes (Signed)
Patient ID: Robert Arnold, male   DOB: 1945-07-26, 66 y.o.   MRN: [id number]   To help with careful documentation, I have reviewed the entire hospitalization record.  Final diagnosis:  Chest pain, possibly ischemic but no troponin abnormality. Further workup to be done as an outpatient.  Daryel November, MD

## 2012-02-21 ENCOUNTER — Ambulatory Visit (HOSPITAL_COMMUNITY): Payer: BC Managed Care – PPO

## 2012-02-24 ENCOUNTER — Ambulatory Visit (HOSPITAL_COMMUNITY): Payer: BC Managed Care – PPO

## 2012-02-26 ENCOUNTER — Ambulatory Visit (HOSPITAL_COMMUNITY): Payer: BC Managed Care – PPO

## 2012-02-28 ENCOUNTER — Ambulatory Visit (HOSPITAL_COMMUNITY): Payer: BC Managed Care – PPO

## 2012-03-02 ENCOUNTER — Ambulatory Visit (HOSPITAL_COMMUNITY): Payer: BC Managed Care – PPO

## 2012-03-04 ENCOUNTER — Ambulatory Visit (HOSPITAL_COMMUNITY): Payer: BC Managed Care – PPO

## 2012-03-06 ENCOUNTER — Ambulatory Visit (HOSPITAL_COMMUNITY): Payer: BC Managed Care – PPO

## 2012-03-09 ENCOUNTER — Ambulatory Visit (HOSPITAL_COMMUNITY): Payer: BC Managed Care – PPO

## 2012-03-11 ENCOUNTER — Ambulatory Visit (HOSPITAL_COMMUNITY): Payer: BC Managed Care – PPO

## 2012-03-13 ENCOUNTER — Ambulatory Visit (HOSPITAL_COMMUNITY): Payer: BC Managed Care – PPO

## 2012-03-16 ENCOUNTER — Ambulatory Visit (HOSPITAL_COMMUNITY): Payer: BC Managed Care – PPO

## 2012-03-18 ENCOUNTER — Ambulatory Visit (HOSPITAL_COMMUNITY): Payer: BC Managed Care – PPO

## 2012-03-20 ENCOUNTER — Ambulatory Visit (HOSPITAL_COMMUNITY): Payer: BC Managed Care – PPO

## 2012-03-23 ENCOUNTER — Ambulatory Visit (HOSPITAL_COMMUNITY): Payer: BC Managed Care – PPO

## 2012-03-25 ENCOUNTER — Ambulatory Visit (HOSPITAL_COMMUNITY): Payer: BC Managed Care – PPO

## 2012-03-27 ENCOUNTER — Ambulatory Visit (HOSPITAL_COMMUNITY): Payer: BC Managed Care – PPO

## 2012-03-30 ENCOUNTER — Ambulatory Visit (HOSPITAL_COMMUNITY): Payer: BC Managed Care – PPO

## 2012-04-03 ENCOUNTER — Ambulatory Visit (HOSPITAL_COMMUNITY): Payer: BC Managed Care – PPO

## 2012-04-06 ENCOUNTER — Ambulatory Visit (HOSPITAL_COMMUNITY): Payer: BC Managed Care – PPO

## 2012-04-08 ENCOUNTER — Ambulatory Visit (HOSPITAL_COMMUNITY): Payer: BC Managed Care – PPO

## 2012-04-10 ENCOUNTER — Ambulatory Visit (HOSPITAL_COMMUNITY): Payer: BC Managed Care – PPO

## 2012-04-13 ENCOUNTER — Ambulatory Visit (HOSPITAL_COMMUNITY): Payer: BC Managed Care – PPO

## 2012-04-15 ENCOUNTER — Ambulatory Visit (HOSPITAL_COMMUNITY): Payer: BC Managed Care – PPO

## 2012-04-16 ENCOUNTER — Encounter (HOSPITAL_COMMUNITY): Payer: Self-pay

## 2012-04-16 ENCOUNTER — Ambulatory Visit (HOSPITAL_COMMUNITY)
Admission: RE | Admit: 2012-04-16 | Discharge: 2012-04-16 | Disposition: A | Discharge status: Home / Self Care | Payer: Medicare Other | Source: Ambulatory Visit | Attending: Internal Medicine | Admitting: Internal Medicine

## 2012-04-16 VITALS — BP 136/82 | HR 66 | Ht 68.0 in | Wt 192.4 lb

## 2012-04-16 DIAGNOSIS — I2510 Atherosclerotic heart disease of native coronary artery without angina pectoris: Secondary | ICD-10-CM | POA: Insufficient documentation

## 2012-04-16 DIAGNOSIS — I5022 Chronic systolic (congestive) heart failure: Secondary | ICD-10-CM | POA: Insufficient documentation

## 2012-04-16 DIAGNOSIS — I4891 Unspecified atrial fibrillation: Secondary | ICD-10-CM | POA: Insufficient documentation

## 2012-04-16 DIAGNOSIS — I10 Essential (primary) hypertension: Secondary | ICD-10-CM

## 2012-04-16 NOTE — Assessment & Plan Note (Signed)
Blood pressure well controlled. Continue current regimen.  

## 2012-04-16 NOTE — Progress Notes (Signed)
Patient ID: Robert Arnold, male   DOB: Apr 19, 1945, 67 y.o.   MRN: [id number] [place]: Dr. 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   Underwent living-related kidney transplant at [facility] on 09/25/2011. Post day #1 had a NSTEMI. Cath revealed multivessel disease (details not available) . Underwent stenting of Xience Expedition LAD (DES). Developed AF and required DC-CV. Treated with coumadin for about a week and then stopped due to ureteral bleeding which required a stent. Echo showed EF 20% with global HK. Transferred to rehab for 11 days. D/c'd July 11  Echo 9/26: EF 50-55%, with question of very mild mid to distal inferior wall hypokinesis. Mild AS.  Developed recurrent CP.   01/14/12 LHC 1. Severe 3v calcific CAD with patent LAD stent 2. Mild AS 3. EF 54% by Myoview.   01/28/12 CABG x 4 at [place] op A fib treated with amio. Now in sinus.  He returns post CABG. Much improved. Able to walk up 22 steps with minimal dyspnea. Denies orthopnea/PND/CP. Working part time. Weight at home 190 pounds.  Compliant with medications. BP well controlled.   ROS: All systems negative except as listed in HPI, PMH and Problem List.  Past Medical History  Diagnosis Date  . Diabetes mellitus type 2, controlled   . Hyperlipidemia   . BPH (benign prostatic hyperplasia)   . Gout   . CAD (coronary artery disease)     s/p post-op NSTEMI. treated with stent to LAD. at [facility]  . PAF (paroxysmal atrial fibrillation)     s/p DC-CV in 6/13. Off coumadin due to ureteral bleed  . OSA (obstructive sleep apnea)   . 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 HTN (hypertension)   . MI (myocardial infarction) 09/26/2011  . Chronic kidney disease     due IgA nephropathy - s/p kidnet transplant 09/25/11    Current  Outpatient Prescriptions  Medication Sig Dispense Refill  . alprazolam (XANAX) 2 MG tablet Take 2 mg by mouth at bedtime as needed. Take one and one half tab bid      . amiodarone (PACERONE) 200 MG tablet Take 200 mg by mouth daily.      Marland Kitchen aspirin EC 81 MG tablet Take 81 mg by mouth daily.      Marland Kitchen buPROPion (WELLBUTRIN XL) 300 MG 24 hr tablet Take 300 mg by mouth daily.       . fenofibrate micronized (LOFIBRA) 134 MG capsule Take 134 mg by mouth daily.       . finasteride (PROSCAR) 5 MG tablet Take 5 mg by mouth daily.       . furosemide (LASIX) 20 MG tablet Take 20 mg by mouth daily.      . insulin glargine (LANTUS) 100 UNIT/ML injection Inject 10 Units into the skin 2 (two) times daily. 13 units q am and 10 units  q pm      . MYFORTIC 360 MG TBEC 360 mg 2 (two) times daily.       . Omega-3 Fatty Acids (FISH OIL) 1200 MG CAPS Take 1 capsule by mouth 2 (two) times daily.      . predniSONE (DELTASONE) 10 MG tablet Take 10 mg by mouth daily.      Marland Kitchen PROGRAF 0.5 MG capsule Take 1 mg by mouth 2 (two) times  daily.       . TRAVATAN Z 0.004 % SOLN ophthalmic solution Place 1 drop into both eyes at bedtime.       Marland Kitchen VALCYTE 450 MG tablet Take 450 mg by mouth daily.       . carvedilol (COREG) 3.125 MG tablet Take 3.125 mg by mouth 2 (two) times daily.       Marland Kitchen CITALOPRAM HYDROBROMIDE PO Take 20 mg by mouth daily.       . Filgrastim (NEUPOGEN IJ) Inject 1 each as directed once. For low blood counts Kidney Transplant patient Course is 1 prefilled syringe Thursday Friday and Saturday Managed by [employer] in [place]      . itraconazole (SPORANOX) 100 MG capsule Take 200 mg by mouth daily. 2 tab      . metFORMIN (GLUCOPHAGE) 500 MG tablet Take by mouth 3 (three) times daily.       . nitroGLYCERIN (NITROSTAT) 0.4 MG SL tablet Place 1 tablet (0.4 mg total) under the tongue every 5 (five) minutes as needed for chest pain.  30 tablet  12  . PHOSPHA 250 NEUTRAL 155-852-130 MG tablet Take 2 tablets by mouth  2 (two) times daily.       . rosuvastatin (CRESTOR) 40 MG tablet Take 20 mg by mouth daily.        . sitaGLIPtin (JANUVIA) 25 MG tablet Take 25 mg by mouth daily.      Marland Kitchen sulfamethoxazole-trimethoprim (BACTRIM,SEPTRA) 400-80 MG per tablet Take 1 tablet by mouth every Monday, Wednesday, and Friday.       . tacrolimus (PROGRAF) 1 MG capsule Take 1 mg by mouth 2 (two) times daily.         PHYSICAL EXAM: Filed Vitals:   04/16/12 0854  BP: 136/82  Pulse: 66  Height: 5\' 8"  (1.727 m)  Weight: 192 lb 6.4 oz (87.272 kg)  SpO2: 100%    General:  Well appearing. No resp difficulty wife present  HEENT: normal Neck: supple. JVP flat. Carotids 2+ bilaterally; + bilat radiated bruits. No lymphadenopathy or thryomegaly appreciated. Cor: PMI normal. Regular rate & rhythm. 2/6 AS Sternal scar healed Lungs: clear Abdomen: soft, nontender, nondistended. No hepatosplenomegaly. No bruits or masses. Good bowel sounds. Extremities: no cyanosis, clubbing, rash, edema Neuro: alert & orientedx3, cranial nerves grossly intact. Moves all 4 extremities w/o difficulty. Affect pleasant.  EKG: SR 65 bpm   ASSESSMENT & PLAN:

## 2012-04-16 NOTE — Patient Instructions (Addendum)
We will contact you in 4 months to schedule your next appointment.  You have been referred to Cardiac Rehab, they will contact you to schedule

## 2012-04-16 NOTE — Assessment & Plan Note (Addendum)
On amio for post-op AF. Now back in SR. Scheduled to stop amio later this week.

## 2012-04-16 NOTE — Assessment & Plan Note (Addendum)
EF has recovered. Volume status stable despite 10 pound weight gain. Continue current regimen. Follow up in 4 months.

## 2012-04-16 NOTE — Assessment & Plan Note (Addendum)
Doing very well s/p CABG x4 01/2012. Refer to Cardiac Rehab.

## 2012-04-17 ENCOUNTER — Ambulatory Visit (HOSPITAL_COMMUNITY): Payer: BC Managed Care – PPO

## 2012-04-17 NOTE — Addendum Note (Signed)
Encounter addended by: Karthika Glasper H Costella Schwarz on: 04/17/2012  7:21 AM<BR>     Documentation filed: Charges VN

## 2012-04-20 ENCOUNTER — Ambulatory Visit (HOSPITAL_COMMUNITY): Payer: BC Managed Care – PPO

## 2012-04-22 ENCOUNTER — Ambulatory Visit (HOSPITAL_COMMUNITY): Payer: BC Managed Care – PPO

## 2012-04-23 ENCOUNTER — Encounter (HOSPITAL_COMMUNITY)
Admission: RE | Admit: 2012-04-23 | Discharge: 2012-04-23 | Disposition: A | Discharge status: Home / Self Care | Payer: Medicare Other | Source: Ambulatory Visit | Attending: Internal Medicine | Admitting: Internal Medicine

## 2012-04-23 DIAGNOSIS — I4891 Unspecified atrial fibrillation: Secondary | ICD-10-CM | POA: Insufficient documentation

## 2012-04-23 DIAGNOSIS — I10 Essential (primary) hypertension: Secondary | ICD-10-CM | POA: Insufficient documentation

## 2012-04-23 DIAGNOSIS — Z5189 Encounter for other specified aftercare: Secondary | ICD-10-CM | POA: Insufficient documentation

## 2012-04-23 DIAGNOSIS — I2510 Atherosclerotic heart disease of native coronary artery without angina pectoris: Secondary | ICD-10-CM | POA: Insufficient documentation

## 2012-04-23 DIAGNOSIS — I5022 Chronic systolic (congestive) heart failure: Secondary | ICD-10-CM | POA: Insufficient documentation

## 2012-04-23 NOTE — Progress Notes (Signed)
Cardiac Rehab Medication Review by a Pharmacist  Does the patient  feel that his/her medications are working for him/her?  yes  Has the patient been experiencing any side effects to the medications prescribed?  Yes - minor tremors  Does the patient measure his/her own blood pressure or blood glucose at home?  yes - twice daily glucose and BP  Does the patient have any problems obtaining medications due to transportation or finances?   no  Understanding of regimen: excellent Understanding of indications: excellent Potential of compliance: excellent    Pharmacist comments: Pt very aware of medications, indications, and regimens.       Robert Arnold, Robert Arnold 04/23/2012 8:24 AM

## 2012-04-24 ENCOUNTER — Encounter (HOSPITAL_COMMUNITY)
Admission: RE | Admit: 2012-04-24 | Discharge: 2012-04-24 | Disposition: A | Discharge status: Home / Self Care | Payer: Medicare Other | Source: Ambulatory Visit | Attending: Internal Medicine | Admitting: Internal Medicine

## 2012-04-24 ENCOUNTER — Ambulatory Visit (HOSPITAL_COMMUNITY): Payer: BC Managed Care – PPO

## 2012-04-27 ENCOUNTER — Ambulatory Visit (HOSPITAL_COMMUNITY): Payer: BC Managed Care – PPO

## 2012-04-27 ENCOUNTER — Encounter (HOSPITAL_COMMUNITY): Payer: Medicare Other

## 2012-04-29 ENCOUNTER — Ambulatory Visit (HOSPITAL_COMMUNITY): Payer: BC Managed Care – PPO

## 2012-04-29 ENCOUNTER — Encounter (HOSPITAL_COMMUNITY): Payer: Medicare Other

## 2012-05-01 ENCOUNTER — Ambulatory Visit (HOSPITAL_COMMUNITY): Payer: BC Managed Care – PPO

## 2012-05-04 ENCOUNTER — Encounter (HOSPITAL_COMMUNITY)
Admission: RE | Admit: 2012-05-04 | Discharge: 2012-05-04 | Disposition: A | Discharge status: Home / Self Care | Payer: Medicare Other | Source: Ambulatory Visit | Attending: Internal Medicine | Admitting: Internal Medicine

## 2012-05-04 LAB — GLUCOSE, CAPILLARY: Glucose-Capillary: 149 mg/dL — ABNORMAL HIGH (ref 70–99)

## 2012-05-06 ENCOUNTER — Encounter (HOSPITAL_COMMUNITY): Payer: Medicare Other

## 2012-05-08 ENCOUNTER — Encounter (HOSPITAL_COMMUNITY)
Admission: RE | Admit: 2012-05-08 | Discharge: 2012-05-08 | Disposition: A | Discharge status: Home / Self Care | Payer: Medicare Other | Source: Ambulatory Visit | Attending: Internal Medicine | Admitting: Internal Medicine

## 2012-05-08 LAB — GLUCOSE, CAPILLARY: Glucose-Capillary: 118 mg/dL — ABNORMAL HIGH (ref 70–99)

## 2012-05-11 ENCOUNTER — Encounter (HOSPITAL_COMMUNITY)
Admission: RE | Admit: 2012-05-11 | Discharge: 2012-05-11 | Disposition: A | Discharge status: Home / Self Care | Payer: Medicare Other | Source: Ambulatory Visit | Attending: Internal Medicine | Admitting: Internal Medicine

## 2012-05-11 DIAGNOSIS — I4891 Unspecified atrial fibrillation: Secondary | ICD-10-CM | POA: Insufficient documentation

## 2012-05-11 DIAGNOSIS — R0609 Other forms of dyspnea: Secondary | ICD-10-CM | POA: Insufficient documentation

## 2012-05-11 DIAGNOSIS — Z5189 Encounter for other specified aftercare: Secondary | ICD-10-CM | POA: Insufficient documentation

## 2012-05-11 DIAGNOSIS — R0989 Other specified symptoms and signs involving the circulatory and respiratory systems: Secondary | ICD-10-CM | POA: Insufficient documentation

## 2012-05-11 DIAGNOSIS — I120 Hypertensive chronic kidney disease with stage 5 chronic kidney disease or end stage renal disease: Secondary | ICD-10-CM | POA: Insufficient documentation

## 2012-05-11 DIAGNOSIS — Z951 Presence of aortocoronary bypass graft: Secondary | ICD-10-CM | POA: Insufficient documentation

## 2012-05-11 DIAGNOSIS — G4733 Obstructive sleep apnea (adult) (pediatric): Secondary | ICD-10-CM | POA: Insufficient documentation

## 2012-05-11 DIAGNOSIS — N186 End stage renal disease: Secondary | ICD-10-CM | POA: Insufficient documentation

## 2012-05-11 DIAGNOSIS — I252 Old myocardial infarction: Secondary | ICD-10-CM | POA: Insufficient documentation

## 2012-05-11 DIAGNOSIS — I5022 Chronic systolic (congestive) heart failure: Secondary | ICD-10-CM | POA: Insufficient documentation

## 2012-05-11 DIAGNOSIS — I2510 Atherosclerotic heart disease of native coronary artery without angina pectoris: Secondary | ICD-10-CM | POA: Insufficient documentation

## 2012-05-13 ENCOUNTER — Encounter (HOSPITAL_COMMUNITY)
Admission: RE | Admit: 2012-05-13 | Discharge: 2012-05-13 | Disposition: A | Discharge status: Home / Self Care | Payer: Medicare Other | Source: Ambulatory Visit | Attending: Internal Medicine | Admitting: Internal Medicine

## 2012-05-18 ENCOUNTER — Encounter (HOSPITAL_COMMUNITY): Payer: Medicare Other

## 2012-05-20 ENCOUNTER — Encounter (HOSPITAL_COMMUNITY): Payer: Medicare Other

## 2012-05-25 ENCOUNTER — Encounter (HOSPITAL_COMMUNITY)
Admission: RE | Admit: 2012-05-25 | Discharge: 2012-05-25 | Disposition: A | Discharge status: Home / Self Care | Payer: Medicare Other | Source: Ambulatory Visit | Attending: Internal Medicine | Admitting: Internal Medicine

## 2012-05-27 ENCOUNTER — Encounter (HOSPITAL_COMMUNITY)
Admission: RE | Admit: 2012-05-27 | Discharge: 2012-05-27 | Disposition: A | Discharge status: Home / Self Care | Payer: Medicare Other | Source: Ambulatory Visit | Attending: Internal Medicine | Admitting: Internal Medicine

## 2012-05-27 LAB — GLUCOSE, CAPILLARY: Glucose-Capillary: 153 mg/dL — ABNORMAL HIGH (ref 70–99)

## 2012-06-01 ENCOUNTER — Encounter (HOSPITAL_COMMUNITY)
Admission: RE | Admit: 2012-06-01 | Discharge: 2012-06-01 | Disposition: A | Discharge status: Home / Self Care | Payer: Medicare Other | Source: Ambulatory Visit | Attending: Internal Medicine | Admitting: Internal Medicine

## 2012-06-03 ENCOUNTER — Encounter (HOSPITAL_COMMUNITY)
Admission: RE | Admit: 2012-06-03 | Discharge: 2012-06-03 | Disposition: A | Discharge status: Home / Self Care | Payer: Medicare Other | Source: Ambulatory Visit | Attending: Internal Medicine | Admitting: Internal Medicine

## 2012-06-08 ENCOUNTER — Encounter (HOSPITAL_COMMUNITY)
Admission: RE | Admit: 2012-06-08 | Discharge: 2012-06-08 | Disposition: A | Discharge status: Home / Self Care | Payer: Medicare Other | Source: Ambulatory Visit | Attending: Internal Medicine | Admitting: Internal Medicine

## 2012-06-08 DIAGNOSIS — I4891 Unspecified atrial fibrillation: Secondary | ICD-10-CM | POA: Insufficient documentation

## 2012-06-08 DIAGNOSIS — Z5189 Encounter for other specified aftercare: Secondary | ICD-10-CM | POA: Insufficient documentation

## 2012-06-08 DIAGNOSIS — I252 Old myocardial infarction: Secondary | ICD-10-CM | POA: Insufficient documentation

## 2012-06-08 DIAGNOSIS — R0989 Other specified symptoms and signs involving the circulatory and respiratory systems: Secondary | ICD-10-CM | POA: Insufficient documentation

## 2012-06-08 DIAGNOSIS — I5022 Chronic systolic (congestive) heart failure: Secondary | ICD-10-CM | POA: Insufficient documentation

## 2012-06-08 DIAGNOSIS — N186 End stage renal disease: Secondary | ICD-10-CM | POA: Insufficient documentation

## 2012-06-08 DIAGNOSIS — R0609 Other forms of dyspnea: Secondary | ICD-10-CM | POA: Insufficient documentation

## 2012-06-08 DIAGNOSIS — I120 Hypertensive chronic kidney disease with stage 5 chronic kidney disease or end stage renal disease: Secondary | ICD-10-CM | POA: Insufficient documentation

## 2012-06-08 DIAGNOSIS — G4733 Obstructive sleep apnea (adult) (pediatric): Secondary | ICD-10-CM | POA: Insufficient documentation

## 2012-06-08 DIAGNOSIS — I2510 Atherosclerotic heart disease of native coronary artery without angina pectoris: Secondary | ICD-10-CM | POA: Insufficient documentation

## 2012-06-08 DIAGNOSIS — Z951 Presence of aortocoronary bypass graft: Secondary | ICD-10-CM | POA: Insufficient documentation

## 2012-06-10 ENCOUNTER — Encounter (HOSPITAL_COMMUNITY)
Admission: RE | Admit: 2012-06-10 | Discharge: 2012-06-10 | Disposition: A | Discharge status: Home / Self Care | Payer: Medicare Other | Source: Ambulatory Visit | Attending: Internal Medicine | Admitting: Internal Medicine

## 2012-06-15 ENCOUNTER — Encounter (HOSPITAL_COMMUNITY)
Admission: RE | Admit: 2012-06-15 | Discharge: 2012-06-15 | Disposition: A | Discharge status: Home / Self Care | Payer: Medicare Other | Source: Ambulatory Visit | Attending: Internal Medicine | Admitting: Internal Medicine

## 2012-06-17 ENCOUNTER — Encounter (HOSPITAL_COMMUNITY): Payer: Medicare Other

## 2012-06-19 ENCOUNTER — Encounter (HOSPITAL_COMMUNITY): Payer: Medicare Other

## 2012-06-22 ENCOUNTER — Encounter (HOSPITAL_COMMUNITY)
Admission: RE | Admit: 2012-06-22 | Discharge: 2012-06-22 | Disposition: A | Discharge status: Home / Self Care | Payer: Medicare Other | Source: Ambulatory Visit | Attending: Internal Medicine | Admitting: Internal Medicine

## 2012-06-22 NOTE — Progress Notes (Signed)
Nutrition Note Spoke with pt. Pt has not returned his nutrition survey. Pt given another copy to complete. Pt to return MEDFICTS Wed 06/24/12. Continue client-centered nutrition education by RD as part of interdisciplinary care.  Monitor and evaluate progress toward nutrition goal with team.  Derek Mound, M.Ed, RD, LDN, CDE 06/22/2012 7:42 AM

## 2012-06-24 ENCOUNTER — Encounter (HOSPITAL_COMMUNITY)
Admission: RE | Admit: 2012-06-24 | Discharge: 2012-06-24 | Disposition: A | Discharge status: Home / Self Care | Payer: Medicare Other | Source: Ambulatory Visit | Attending: Internal Medicine | Admitting: Internal Medicine

## 2012-06-29 ENCOUNTER — Encounter (HOSPITAL_COMMUNITY)
Admission: RE | Admit: 2012-06-29 | Discharge: 2012-06-29 | Disposition: A | Discharge status: Home / Self Care | Payer: Medicare Other | Source: Ambulatory Visit

## 2012-06-29 NOTE — Progress Notes (Signed)
Pt will be absent from cardiac rehab the week of 3/24 due to cataract surgery.

## 2012-07-01 ENCOUNTER — Encounter (HOSPITAL_COMMUNITY): Payer: Medicare Other

## 2012-07-06 ENCOUNTER — Encounter (HOSPITAL_COMMUNITY)
Admission: RE | Admit: 2012-07-06 | Discharge: 2012-07-06 | Disposition: A | Discharge status: Home / Self Care | Payer: Medicare Other | Source: Ambulatory Visit | Attending: Internal Medicine | Admitting: Internal Medicine

## 2012-07-06 NOTE — Progress Notes (Signed)
Pt returned today from absence due to cataract surgery.  Pt with no limitations and restrictions with exercise.  Pt tolerated light exercise with no complaints.

## 2012-07-08 ENCOUNTER — Encounter (HOSPITAL_COMMUNITY)
Admission: RE | Admit: 2012-07-08 | Discharge: 2012-07-08 | Disposition: A | Discharge status: Home / Self Care | Payer: Medicare Other | Source: Ambulatory Visit | Attending: Internal Medicine | Admitting: Internal Medicine

## 2012-07-08 DIAGNOSIS — R0609 Other forms of dyspnea: Secondary | ICD-10-CM | POA: Insufficient documentation

## 2012-07-08 DIAGNOSIS — G4733 Obstructive sleep apnea (adult) (pediatric): Secondary | ICD-10-CM | POA: Insufficient documentation

## 2012-07-08 DIAGNOSIS — Z951 Presence of aortocoronary bypass graft: Secondary | ICD-10-CM | POA: Insufficient documentation

## 2012-07-08 DIAGNOSIS — I252 Old myocardial infarction: Secondary | ICD-10-CM | POA: Insufficient documentation

## 2012-07-08 DIAGNOSIS — N186 End stage renal disease: Secondary | ICD-10-CM | POA: Insufficient documentation

## 2012-07-08 DIAGNOSIS — I4891 Unspecified atrial fibrillation: Secondary | ICD-10-CM | POA: Insufficient documentation

## 2012-07-08 DIAGNOSIS — R0989 Other specified symptoms and signs involving the circulatory and respiratory systems: Secondary | ICD-10-CM | POA: Insufficient documentation

## 2012-07-08 DIAGNOSIS — I2510 Atherosclerotic heart disease of native coronary artery without angina pectoris: Secondary | ICD-10-CM | POA: Insufficient documentation

## 2012-07-08 DIAGNOSIS — I120 Hypertensive chronic kidney disease with stage 5 chronic kidney disease or end stage renal disease: Secondary | ICD-10-CM | POA: Insufficient documentation

## 2012-07-08 DIAGNOSIS — Z5189 Encounter for other specified aftercare: Secondary | ICD-10-CM | POA: Insufficient documentation

## 2012-07-08 DIAGNOSIS — I5022 Chronic systolic (congestive) heart failure: Secondary | ICD-10-CM | POA: Insufficient documentation

## 2012-07-13 ENCOUNTER — Encounter (HOSPITAL_COMMUNITY)
Admission: RE | Admit: 2012-07-13 | Discharge: 2012-07-13 | Disposition: A | Discharge status: Home / Self Care | Payer: Medicare Other | Source: Ambulatory Visit | Attending: Internal Medicine | Admitting: Internal Medicine

## 2012-07-13 NOTE — Progress Notes (Signed)
Pt given vocational rehab form to complete and bring back to rehab.  Beau Vanduzer Elana Alm, BSN;

## 2012-07-15 ENCOUNTER — Encounter (HOSPITAL_COMMUNITY)
Admission: RE | Admit: 2012-07-15 | Discharge: 2012-07-15 | Disposition: A | Discharge status: Home / Self Care | Payer: Medicare Other | Source: Ambulatory Visit | Attending: Internal Medicine | Admitting: Internal Medicine

## 2012-07-20 ENCOUNTER — Encounter (HOSPITAL_COMMUNITY)
Admission: RE | Admit: 2012-07-20 | Discharge: 2012-07-20 | Disposition: A | Discharge status: Home / Self Care | Payer: Medicare Other | Source: Ambulatory Visit | Attending: Internal Medicine | Admitting: Internal Medicine

## 2012-07-22 ENCOUNTER — Encounter (HOSPITAL_COMMUNITY): Payer: Medicare Other

## 2012-07-27 ENCOUNTER — Encounter (HOSPITAL_COMMUNITY)
Admission: RE | Admit: 2012-07-27 | Discharge: 2012-07-27 | Disposition: A | Discharge status: Home / Self Care | Payer: Medicare Other | Source: Ambulatory Visit | Attending: Internal Medicine | Admitting: Internal Medicine

## 2012-07-27 NOTE — Progress Notes (Signed)
Nutrition Note Spoke with pt. Pt has not returned his nutrition survey. Pt given another copy to complete. Pt agreed to complete MEDFICTS with this writer if pt does not return MEDFICTS this time. Continue client-centered nutrition education by RD as part of interdisciplinary care.  Monitor and evaluate progress toward nutrition goal with team.  Derek Mound, M.Ed, RD, LDN, CDE 07/27/2012 8:58 AM

## 2012-07-29 ENCOUNTER — Encounter (HOSPITAL_COMMUNITY)
Admission: RE | Admit: 2012-07-29 | Discharge: 2012-07-29 | Disposition: A | Discharge status: Home / Self Care | Payer: Medicare Other | Source: Ambulatory Visit | Attending: Internal Medicine | Admitting: Internal Medicine

## 2012-08-03 ENCOUNTER — Encounter (HOSPITAL_COMMUNITY)
Admission: RE | Admit: 2012-08-03 | Discharge: 2012-08-03 | Disposition: A | Discharge status: Home / Self Care | Payer: Medicare Other | Source: Ambulatory Visit | Attending: Internal Medicine | Admitting: Internal Medicine

## 2012-08-03 NOTE — Progress Notes (Signed)
Robert Arnold 67 y.o. male Nutrition Note Spoke with pt.  Nutrition Plan and Nutrition Survey goals reviewed with pt. Pt is following Step 2 of the Therapeutic Lifestyle Changes diet. Pt wants to lose wt. Pt wt today 88.7 kg, which is up 2.4 kg from his initial wt. Wt loss tips reviewed. Pt is diabetic. Last A1c indicates blood glucose well-controlled. Pt reports he checks his CBG's 2-3 times a day at varied times. Per pt, "I check my blood sugar fasting, before meals, and 3 hours after a meal." Pt does not eat a bedtime snack. Pt encouraged to add a bedtime snack now that he is on insulin. Pt expressed understanding of the information reviewed. Pt aware of nutrition education classes offered and is unable to attend nutrition classes due to work.  Nutrition Diagnosis   Food-and nutrition-related knowledge deficit related to lack of exposure to information as related to diagnosis of: ? CVD ? DM (A1c 6.5)\   Overweight related to excessive energy intake as evidenced by a BMI of 29.6  Nutrition RX/ Estimated Daily Nutrition Needs for: wt loss  1450-1950 Kcal, 40-50 gm fat, 9-13 gm sat fat, 1.4-1.9 gm trans-fat, <1500 mg sodium, 175-250 gm CHO   Nutrition Intervention   Pt's individual nutrition plan reviewed with pt.   Benefits of adopting Therapeutic Lifestyle Changes discussed when Medficts reviewed.   Pt to attend the Portion Distortion class   Pt given handouts for: ? Nutrition I class ? Nutrition II class ? DM    Continue client-centered nutrition education by RD, as part of interdisciplinary care. Goal(s)   Pt to identify food quantities necessary to achieve: ? wt loss to a goal wt of 166-178 lb (75.4-80.8 kg) at graduation from cardiac rehab.  Monitor and Evaluate progress toward nutrition goal with team. Nutrition Risk: Change to Moderate   Derek Mound, M.Ed, RD, LDN, CDE 08/03/2012 7:46 AM

## 2012-08-05 ENCOUNTER — Encounter (HOSPITAL_COMMUNITY)
Admission: RE | Admit: 2012-08-05 | Discharge: 2012-08-05 | Disposition: A | Discharge status: Home / Self Care | Payer: Medicare Other | Source: Ambulatory Visit | Attending: Internal Medicine | Admitting: Internal Medicine

## 2012-08-10 ENCOUNTER — Encounter (HOSPITAL_COMMUNITY)
Admission: RE | Admit: 2012-08-10 | Discharge: 2012-08-10 | Disposition: A | Discharge status: Home / Self Care | Payer: Medicare Other | Source: Ambulatory Visit | Attending: Internal Medicine | Admitting: Internal Medicine

## 2012-08-10 DIAGNOSIS — Z5189 Encounter for other specified aftercare: Secondary | ICD-10-CM | POA: Insufficient documentation

## 2012-08-10 DIAGNOSIS — R0989 Other specified symptoms and signs involving the circulatory and respiratory systems: Secondary | ICD-10-CM | POA: Insufficient documentation

## 2012-08-10 DIAGNOSIS — I4891 Unspecified atrial fibrillation: Secondary | ICD-10-CM | POA: Insufficient documentation

## 2012-08-10 DIAGNOSIS — I5022 Chronic systolic (congestive) heart failure: Secondary | ICD-10-CM | POA: Insufficient documentation

## 2012-08-10 DIAGNOSIS — Z951 Presence of aortocoronary bypass graft: Secondary | ICD-10-CM | POA: Insufficient documentation

## 2012-08-10 DIAGNOSIS — N186 End stage renal disease: Secondary | ICD-10-CM | POA: Insufficient documentation

## 2012-08-10 DIAGNOSIS — G4733 Obstructive sleep apnea (adult) (pediatric): Secondary | ICD-10-CM | POA: Insufficient documentation

## 2012-08-10 DIAGNOSIS — R0609 Other forms of dyspnea: Secondary | ICD-10-CM | POA: Insufficient documentation

## 2012-08-10 DIAGNOSIS — I252 Old myocardial infarction: Secondary | ICD-10-CM | POA: Insufficient documentation

## 2012-08-10 DIAGNOSIS — I120 Hypertensive chronic kidney disease with stage 5 chronic kidney disease or end stage renal disease: Secondary | ICD-10-CM | POA: Insufficient documentation

## 2012-08-10 DIAGNOSIS — I2510 Atherosclerotic heart disease of native coronary artery without angina pectoris: Secondary | ICD-10-CM | POA: Insufficient documentation

## 2012-08-12 ENCOUNTER — Encounter (HOSPITAL_COMMUNITY): Payer: Medicare Other

## 2012-08-12 ENCOUNTER — Encounter (HOSPITAL_COMMUNITY)
Admission: RE | Admit: 2012-08-12 | Payer: Medicare Other | Source: Ambulatory Visit | Attending: Internal Medicine | Admitting: Internal Medicine

## 2012-08-13 ENCOUNTER — Ambulatory Visit (HOSPITAL_COMMUNITY)
Admission: RE | Admit: 2012-08-13 | Discharge: 2012-08-13 | Disposition: A | Discharge status: Home / Self Care | Payer: Medicare Other | Source: Ambulatory Visit | Attending: Internal Medicine | Admitting: Internal Medicine

## 2012-08-13 VITALS — BP 120/64 | HR 68 | Wt 190.2 lb

## 2012-08-13 DIAGNOSIS — E119 Type 2 diabetes mellitus without complications: Secondary | ICD-10-CM | POA: Insufficient documentation

## 2012-08-13 DIAGNOSIS — I10 Essential (primary) hypertension: Secondary | ICD-10-CM | POA: Insufficient documentation

## 2012-08-13 DIAGNOSIS — I2510 Atherosclerotic heart disease of native coronary artery without angina pectoris: Secondary | ICD-10-CM | POA: Insufficient documentation

## 2012-08-13 NOTE — Assessment & Plan Note (Addendum)
No ischemic symptoms.  Continue ASA and fenofibrate.  Continue cardiac rehab.

## 2012-08-13 NOTE — Assessment & Plan Note (Signed)
Well-controlled, continue current regimen 

## 2012-08-13 NOTE — Progress Notes (Addendum)
[place]: Dr. Dillard Essex PCP: Dr. Melford Aase Nephrologist: Dr. 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   He underwent living-related kidney transplant at [facility] on 09/25/2011. Post day #1 had a NSTEMI. Cath revealed multivessel disease (details not available) . Underwent stenting of Xience Expedition LAD (DES). Developed AF and required DC-CV. Treated with coumadin for about a week and then stopped due to ureteral bleeding which required a stent. Echo showed EF 20% with global HK. Transferred to rehab for 11 days. D/c'd October 17 2011.    Echo 9/26: EF 50-55%, with question of very mild mid to distal inferior wall hypokinesis. Mild AS.  Developed recurrent CP.   01/14/12 LHC 1. Severe 3v calcific CAD with patent LAD stent 2. Mild AS 3. EF 54% by Myoview.   01/28/12 CABG x 4 at [place] op A fib treated with amio. Now in sinus.  He returns for follow up today.  He feels really well.  Says his heart is doing great.  Currently having problems with low blood sugars.  Medications are causing tremors but this is being followed by his PCP.  He is working with cardiac rehab 2x a week without trouble.  He is also working out at home.  He denies edema, orthopnea or PND.  He hasn't needed lasix in a couple of months.  He is getting lab work every Monday by Dr. Ailene Rud.  No chest pain.   ROS: All systems negative except as listed in HPI, PMH and Problem List.  Past Medical History  Diagnosis Date  . Diabetes mellitus type 2, controlled   . Hyperlipidemia   . BPH (benign prostatic hyperplasia)   . Gout   . CAD (coronary artery disease)     s/p post-op NSTEMI. treated with stent to LAD. at [facility]  . PAF (paroxysmal atrial fibrillation)     s/p DC-CV in 6/13. Off coumadin due to ureteral bleed  . OSA  (obstructive sleep apnea)   . 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 HTN (hypertension)   . MI (myocardial infarction) 09/26/2011  . Chronic kidney disease     due IgA nephropathy - s/p kidnet transplant 09/25/11    Current Outpatient Prescriptions  Medication Sig Dispense Refill  . aspirin EC 81 MG tablet Take 81 mg by mouth daily.      Marland Kitchen buPROPion (WELLBUTRIN XL) 300 MG 24 hr tablet Take 300 mg by mouth daily.       . citalopram (CELEXA) 40 MG tablet Take 20 mg by mouth daily.      . fenofibrate micronized (LOFIBRA) 134 MG capsule Take 134 mg by mouth daily.       . finasteride (PROSCAR) 5 MG tablet Take 5 mg by mouth daily.       . furosemide (LASIX) 20 MG tablet Take 20 mg by mouth daily as needed.      . insulin glargine (LANTUS) 100 UNIT/ML injection Inject 40 Units into the skin daily.       . insulin lispro (HUMALOG) 100 UNIT/ML injection Inject 5 Units into the skin 3 (three) times daily before meals.       Marland Kitchen lisdexamfetamine (VYVANSE) 50 MG capsule Take 50 mg by mouth every morning.      Marland Kitchen MYFORTIC 360 MG TBEC 360 mg  2 (two) times daily.       . nitroGLYCERIN (NITROSTAT) 0.4 MG SL tablet Place 1 tablet (0.4 mg total) under the tongue every 5 (five) minutes as needed for chest pain.  30 tablet  12  . Omega-3 Fatty Acids (FISH OIL) 1200 MG CAPS Take 1 capsule by mouth 2 (two) times daily.      . predniSONE (DELTASONE) 2.5 MG tablet Take 2.5 mg by mouth daily.      Marland Kitchen PROGRAF 0.5 MG capsule Take 2 mg by mouth 2 (two) times daily.       Marland Kitchen sulfamethoxazole-trimethoprim (BACTRIM,SEPTRA) 400-80 MG per tablet Take 1 tablet by mouth every Monday, Wednesday, and Friday.       . TRAVATAN Z 0.004 % SOLN ophthalmic solution Place 1 drop into both eyes at bedtime.        No current facility-administered medications for this encounter.     PHYSICAL EXAM: Filed Vitals:   08/13/12 0855  BP: 120/64  Pulse: 68  Weight: 190 lb 4 oz (86.297 kg)  SpO2: 97%    General:  Well  appearing. No resp difficulty wife present  HEENT: normal Neck: supple. JVP flat. Carotids 2+ bilaterally; + bilat radiated bruits. No lymphadenopathy or thryomegaly appreciated. Cor: PMI normal. Regular rate & rhythm. 2/6 AS  Lungs: clear Abdomen: soft, nontender, nondistended. No hepatosplenomegaly. No bruits or masses. Good bowel sounds. Extremities: no cyanosis, clubbing, rash, edema Neuro: alert & orientedx3, cranial nerves grossly intact. Moves all 4 extremities w/o difficulty. Affect pleasant.   ASSESSMENT & PLAN:

## 2012-08-13 NOTE — Assessment & Plan Note (Signed)
Frequent episodes of hypoglycemia being followed by PCP.

## 2012-08-17 ENCOUNTER — Encounter (HOSPITAL_COMMUNITY)
Admission: RE | Admit: 2012-08-17 | Discharge: 2012-08-17 | Disposition: A | Discharge status: Home / Self Care | Payer: Medicare Other | Source: Ambulatory Visit | Attending: Internal Medicine | Admitting: Internal Medicine

## 2012-08-19 ENCOUNTER — Encounter (HOSPITAL_COMMUNITY)
Admission: RE | Admit: 2012-08-19 | Discharge: 2012-08-19 | Disposition: A | Discharge status: Home / Self Care | Payer: Medicare Other | Source: Ambulatory Visit | Attending: Internal Medicine | Admitting: Internal Medicine

## 2012-08-24 ENCOUNTER — Encounter (HOSPITAL_COMMUNITY)
Admission: RE | Admit: 2012-08-24 | Discharge: 2012-08-24 | Disposition: A | Discharge status: Home / Self Care | Payer: Medicare Other | Source: Ambulatory Visit | Attending: Internal Medicine | Admitting: Internal Medicine

## 2012-08-26 ENCOUNTER — Encounter (HOSPITAL_COMMUNITY)
Admission: RE | Admit: 2012-08-26 | Discharge: 2012-08-26 | Disposition: A | Discharge status: Home / Self Care | Payer: Medicare Other | Source: Ambulatory Visit | Attending: Internal Medicine | Admitting: Internal Medicine

## 2012-08-26 NOTE — Progress Notes (Signed)
Pt completed 28 exercise session out of 36 in 18 week period.  Pt's attendance was hampered by his attending 2 x week due to work schedule.  Pt plans to continue his home exercise in conjunction with the cardiac maintenance program in a couple of weeks.  Medication list reconciled.  Pt reports improvement in his QOL survey scores.  Pt feels he has made tremendous improvement in his energy and stamina.  Pt feels confident in his abilities to exercise safely.

## 2012-09-10 ENCOUNTER — Encounter: Payer: Self-pay | Admitting: Internal Medicine

## 2012-09-23 ENCOUNTER — Other Ambulatory Visit: Payer: Self-pay | Admitting: Internal Medicine

## 2012-09-23 DIAGNOSIS — R748 Abnormal levels of other serum enzymes: Secondary | ICD-10-CM

## 2012-09-28 ENCOUNTER — Ambulatory Visit
Admission: RE | Admit: 2012-09-28 | Discharge: 2012-09-28 | Disposition: A | Discharge status: Home / Self Care | Payer: BC Managed Care – PPO | Source: Ambulatory Visit | Attending: Internal Medicine | Admitting: Internal Medicine

## 2012-09-28 DIAGNOSIS — R748 Abnormal levels of other serum enzymes: Secondary | ICD-10-CM

## 2012-12-21 ENCOUNTER — Other Ambulatory Visit (HOSPITAL_COMMUNITY): Payer: Self-pay | Admitting: Internal Medicine

## 2012-12-21 ENCOUNTER — Ambulatory Visit (HOSPITAL_COMMUNITY)
Admission: RE | Admit: 2012-12-21 | Discharge: 2012-12-21 | Disposition: A | Discharge status: Home / Self Care | Payer: Medicare Other | Source: Ambulatory Visit | Attending: Internal Medicine | Admitting: Internal Medicine

## 2012-12-21 DIAGNOSIS — J8410 Pulmonary fibrosis, unspecified: Secondary | ICD-10-CM | POA: Insufficient documentation

## 2012-12-21 DIAGNOSIS — I10 Essential (primary) hypertension: Secondary | ICD-10-CM

## 2012-12-21 DIAGNOSIS — Z951 Presence of aortocoronary bypass graft: Secondary | ICD-10-CM | POA: Insufficient documentation

## 2012-12-21 DIAGNOSIS — E119 Type 2 diabetes mellitus without complications: Secondary | ICD-10-CM | POA: Insufficient documentation

## 2013-02-19 ENCOUNTER — Telehealth (HOSPITAL_COMMUNITY): Payer: Self-pay | Admitting: Cardiology

## 2013-02-19 DIAGNOSIS — I509 Heart failure, unspecified: Secondary | ICD-10-CM

## 2013-02-19 NOTE — Telephone Encounter (Signed)
ORDER PLACED FOR ECHO

## 2013-02-24 ENCOUNTER — Ambulatory Visit (HOSPITAL_COMMUNITY)
Admission: RE | Admit: 2013-02-24 | Discharge: 2013-02-24 | Disposition: A | Discharge status: Home / Self Care | Payer: Medicare Other | Source: Ambulatory Visit | Attending: Internal Medicine | Admitting: Internal Medicine

## 2013-02-24 ENCOUNTER — Ambulatory Visit (HOSPITAL_BASED_OUTPATIENT_CLINIC_OR_DEPARTMENT_OTHER)
Admission: RE | Admit: 2013-02-24 | Discharge: 2013-02-24 | Disposition: A | Discharge status: Home / Self Care | Payer: Medicare Other | Source: Ambulatory Visit | Attending: Internal Medicine | Admitting: Internal Medicine

## 2013-02-24 VITALS — BP 138/78 | HR 74 | Wt 195.0 lb

## 2013-02-24 DIAGNOSIS — I4891 Unspecified atrial fibrillation: Secondary | ICD-10-CM | POA: Insufficient documentation

## 2013-02-24 DIAGNOSIS — I517 Cardiomegaly: Secondary | ICD-10-CM

## 2013-02-24 DIAGNOSIS — I2510 Atherosclerotic heart disease of native coronary artery without angina pectoris: Secondary | ICD-10-CM | POA: Diagnosis not present

## 2013-02-24 DIAGNOSIS — E785 Hyperlipidemia, unspecified: Secondary | ICD-10-CM | POA: Insufficient documentation

## 2013-02-24 DIAGNOSIS — N400 Enlarged prostate without lower urinary tract symptoms: Secondary | ICD-10-CM | POA: Insufficient documentation

## 2013-02-24 DIAGNOSIS — Z7982 Long term (current) use of aspirin: Secondary | ICD-10-CM | POA: Insufficient documentation

## 2013-02-24 DIAGNOSIS — Z940 Kidney transplant status: Secondary | ICD-10-CM | POA: Insufficient documentation

## 2013-02-24 DIAGNOSIS — Z951 Presence of aortocoronary bypass graft: Secondary | ICD-10-CM | POA: Insufficient documentation

## 2013-02-24 DIAGNOSIS — I10 Essential (primary) hypertension: Secondary | ICD-10-CM | POA: Insufficient documentation

## 2013-02-24 DIAGNOSIS — Z7901 Long term (current) use of anticoagulants: Secondary | ICD-10-CM | POA: Insufficient documentation

## 2013-02-24 DIAGNOSIS — G4733 Obstructive sleep apnea (adult) (pediatric): Secondary | ICD-10-CM | POA: Insufficient documentation

## 2013-02-24 DIAGNOSIS — M109 Gout, unspecified: Secondary | ICD-10-CM | POA: Diagnosis not present

## 2013-02-24 DIAGNOSIS — E119 Type 2 diabetes mellitus without complications: Secondary | ICD-10-CM | POA: Insufficient documentation

## 2013-02-24 DIAGNOSIS — I252 Old myocardial infarction: Secondary | ICD-10-CM | POA: Insufficient documentation

## 2013-02-24 DIAGNOSIS — I509 Heart failure, unspecified: Secondary | ICD-10-CM

## 2013-02-24 DIAGNOSIS — Z794 Long term (current) use of insulin: Secondary | ICD-10-CM | POA: Insufficient documentation

## 2013-02-24 DIAGNOSIS — G40909 Epilepsy, unspecified, not intractable, without status epilepticus: Secondary | ICD-10-CM | POA: Insufficient documentation

## 2013-02-24 DIAGNOSIS — Z79899 Other long term (current) drug therapy: Secondary | ICD-10-CM | POA: Insufficient documentation

## 2013-02-24 MED ORDER — PRAVASTATIN SODIUM 40 MG PO TABS: 40.0000 mg | ORAL_TABLET | Freq: Every day | ORAL | Status: DC

## 2013-02-24 NOTE — Progress Notes (Signed)
Patient ID: Robert Arnold, male   DOB: 1945-10-28, 67 y.o.   MRN: [id number] Sanger: Dr. Dillard Arnold PCP: Dr. Melford Arnold Nephrologist: Dr. Ailene Arnold  HPI: Mr. 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  He underwent living-related kidney transplant at [facility] on 09/25/2011. Post day #1 had a NSTEMI. Cath revealed multivessel disease (details not available) . Underwent stenting of Xience Expedition LAD (DES). Developed AF and required DC-CV. Treated with coumadin for about a week and then stopped due to ureteral bleeding which required a stent. Echo showed EF 20% with global HK. Transferred to rehab for 11 days. D/c'd October 17 2011.    Echo 01/02/2012: EF 50-55%, with question of very mild mid to distal inferior wall hypokinesis. Mild AS.   01/14/12 LHC 1. Severe 3v calcific CAD with patent LAD stent 2. Mild AS 3. EF 54% by Myoview.   01/28/12 CABG x 4 at [facility] op A fib treated with amio.  02/24/13 ECHO: EF 50% with inferior/posterior HK, mild LVH, no AS  ECG: NSR, LAE, 1st degree AV block, nonspecific ST changes.   Follow up: Completed CR. Feeling pretty good. Denies CP, orthopnea, SOB or edema. No longer having low blood sugars. Has not needed any lasix since last visit. No palpitations or dizziness. PCP manages cholesterol and checks kidney function regularly. Still having major problems with memory (short term, organization, and focus).  ROS: All systems negative except as listed in HPI, PMH and Problem List.  Past Medical History  Diagnosis Date  . Diabetes mellitus type 2, controlled   . Hyperlipidemia   . BPH (benign prostatic hyperplasia)   . Gout   . CAD (coronary artery disease)     s/p post-op NSTEMI. treated with stent to LAD. at [facility]  . PAF (paroxysmal atrial fibrillation)     s/p DC-CV  in 6/13. Off coumadin due to ureteral bleed  . OSA (obstructive sleep apnea)   . 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 HTN (hypertension)   . MI (myocardial infarction) 09/26/2011  . Chronic kidney disease     due IgA nephropathy - s/p kidnet transplant 09/25/11    Current Outpatient Prescriptions  Medication Sig Dispense Refill  . aspirin EC 81 MG tablet Take 81 mg by mouth daily.      . citalopram (CELEXA) 40 MG tablet Take 20 mg by mouth daily.      . fenofibrate micronized (LOFIBRA) 134 MG capsule Take 134 mg by mouth daily.       . finasteride (PROSCAR) 5 MG tablet Take 5 mg by mouth daily.       . furosemide (LASIX) 20 MG tablet Take 20 mg by mouth daily as needed.      . insulin glargine (LANTUS) 100 UNIT/ML injection Inject 20 Units into the skin 2 (two) times daily.       . insulin lispro (HUMALOG) 100 UNIT/ML injection Inject 5 Units into the skin 3 (three) times daily before meals. ONLY IF BS GREATER THAN 110      . lisdexamfetamine (VYVANSE) 60 MG capsule Take 60 mg by mouth every morning.      Marland Kitchen MYFORTIC 360 MG TBEC 360 mg 2 (two) times daily.       . nitroGLYCERIN (NITROSTAT) 0.4 MG SL tablet Place 1 tablet (0.4  mg total) under the tongue every 5 (five) minutes as needed for chest pain.  30 tablet  12  . Omega-3 Fatty Acids (FISH OIL) 1200 MG CAPS Take 1 capsule by mouth 2 (two) times daily.      Marland Kitchen PROGRAF 0.5 MG capsule 3 MG IN AM 2MG  IN PM      . sulfamethoxazole-trimethoprim (BACTRIM,SEPTRA) 400-80 MG per tablet Take 1 tablet by mouth every Monday, Wednesday, and Friday.       . TRAVATAN Z 0.004 % SOLN ophthalmic solution Place 1 drop into both eyes at bedtime.       Marland Kitchen warfarin (COUMADIN) 5 MG tablet Take 5 mg by mouth daily. 5 mg on M, Tues, Wed, Fri, and Sat and then 7.5 mg on Thurs and Sunday       No current facility-administered medications for this encounter.    Filed Vitals:   02/24/13 0911  BP: 138/78  Pulse: 74  Weight: 195 lb (88.451 kg)   SpO2: 97%    PHYSICAL EXAM: General:  Well appearing. No resp difficulty HEENT: normal Neck: supple. JVP flat. Carotids 2+ bilaterally; + bilat radiated bruits. No lymphadenopathy or thryomegaly appreciated. Cor: PMI normal. Regular rate & rhythm. 2/6 RSB SM Lungs: clear Abdomen: soft, nontender, nondistended. No hepatosplenomegaly. No bruits or masses. Good bowel sounds. Extremities: no cyanosis, clubbing, rash, edema Neuro: alert & orientedx3, cranial nerves grossly intact. Moves all 4 extremities w/o difficulty. Affect pleasant.  EKG: SR with 1 degree AV block, non-specific ST changes  ASSESSMENT & PLAN:  1) CAD: s/p CABG x4 2013;  - no s/s of ischemia. He is currently not on a statin. With strong history of CAD will start on pravastatin 40 mg daily. Chose pravastain because medication with least interactions since he is on anti-rejection medications. Check LFTs and lipid profile in 2 months.  2) HTN - Controlled. Not currently on any medications.   3) HLD - Check lipid profile today. As above will start pravastatin with hx of CAD.   4) Afib - EKG today NSR 73 bpm with 1 degree AV block. Will continue Coumadin. Can stop ASA.  F/U 6 months  Junie Bame B NP-C 1:03 PM  Patient seen with NP, agree with the above note.   I reviewed his echo today.  EF 50% with inferior and posterior hypokinesis.  This is compatible with prior studies.    He is on ASA along with coumadin.  Given stable CAD, can stop ASA.   He needs to start a statin, will use pravastatin 40 daily given anti-rejection meds. Lipids/LFTs in 2 months.   Loralie Champagne 02/24/2013

## 2013-02-24 NOTE — Patient Instructions (Signed)
Start pravastatin 40 mg daily. In 2 months need to have liver function tests and lipid profile.  You can stop Aspirin.  Call any issues.  Do the following things EVERYDAY: 1) Weigh yourself in the morning before breakfast. Write it down and keep it in a log. 2) Take your medicines as prescribed 3) Eat low salt foods-Limit salt (sodium) to 2000 mg per day.  4) Stay as active as you can everyday 5) Limit all fluids for the day to less than 2 liters 6)

## 2013-02-24 NOTE — Progress Notes (Signed)
Echo Lab  2D Echocardiogram completed.  [place], RDCS 02/24/2013 8:57 AM

## 2013-02-25 NOTE — Addendum Note (Signed)
Encounter addended by: Butch Penny, CCT on: 02/25/2013 11:45 AM<BR>     Documentation filed: Charges VN

## 2013-03-02 ENCOUNTER — Ambulatory Visit: Payer: Self-pay | Admitting: Internal Medicine

## 2013-03-06 ENCOUNTER — Encounter: Payer: Self-pay | Admitting: Internal Medicine

## 2013-03-09 ENCOUNTER — Encounter: Payer: Self-pay | Admitting: Emergency Medicine

## 2013-03-09 ENCOUNTER — Ambulatory Visit (INDEPENDENT_AMBULATORY_CARE_PROVIDER_SITE_OTHER): Payer: Medicare Other | Admitting: Emergency Medicine

## 2013-03-09 VITALS — BP 122/68 | HR 68 | Temp 97.9°F | Resp 16 | Wt 199.8 lb

## 2013-03-09 DIAGNOSIS — I4891 Unspecified atrial fibrillation: Secondary | ICD-10-CM

## 2013-03-09 MED ORDER — LISDEXAMFETAMINE DIMESYLATE 60 MG PO CAPS: 60.0000 mg | ORAL_CAPSULE | ORAL | Status: DC

## 2013-03-09 NOTE — Patient Instructions (Signed)
Atrial Fibrillation °Atrial fibrillation is a condition that causes your heart to beat irregularly. It may also cause your heart to beat faster than normal. Atrial fibrillation can prevent your heart from pumping blood normally. It increases your risk of stroke and heart problems. °HOME CARE °· Take medications as told by your doctor. °· Only take medications that your doctor says are safe. Some medications can make the condition worse or happen again. °· If blood thinners were prescribed by your doctor, take them exactly as told. Too much can cause bleeding. Too little and you will not have the needed protection against stroke and other problems. °· Perform blood tests at home if told by your doctor. °· Perform blood tests exactly as told by your doctor. °· Do not drink alcohol. °· Do not drink beverages with caffeine such as coffee, soda, and some teas. °· Maintain a healthy weight. °· Do not use diet pills unless your doctor says they are safe. They may make heart problems worse. °· Follow diet instructions as told by your doctor. °· Exercise regularly as told by your doctor. °· Keep all follow-up appointments. °GET HELP RIGHT AWAY IF:  °· You have chest or belly (abdominal) pain. °· You feel sick to your stomach (nauseous) °· You suddenly have swollen feet and ankles. °· You feel dizzy. °· You face, arms, or legs feel numb or weak. °· There is a change in your vision or speech. °· You notice a change in the speed, rhythm, or strength of your heartbeat. °· You suddenly begin peeing (urinating) more often. °· You get tired more easily when moving or exercising. °MAKE SURE YOU:  °· Understand these instructions. °· Will watch your condition. °· Will get help right away if you are not doing well or get worse. °Document Released: 01/02/2008 Document Revised: 07/20/2012 Document Reviewed: 05/05/2012 °[employer]® Patient Information ©2014 [employer]. ° °

## 2013-03-09 NOTE — Progress Notes (Signed)
Subjective:    Patient ID: Robert Arnold, male    DOB: Jun 02, 1945, 67 y.o.   MRN: [id number]  HPI Comments: 67 yo male presents for recheck Coumadin prophylaxis for Afib history. He is currently monitored Qmonth for kidney function and would like all labs to be done at same time. He notes kidney function had improved after last labs per nephrology. He denies any SE with Coumadin. He notes inrcease of Vyvanse to 60 mg has helped with focus and energy.   Current Outpatient Prescriptions on File Prior to Visit  Medication Sig Dispense Refill  . citalopram (CELEXA) 40 MG tablet Take 20 mg by mouth daily.      . fenofibrate micronized (LOFIBRA) 134 MG capsule Take 134 mg by mouth daily.       . finasteride (PROSCAR) 5 MG tablet Take 5 mg by mouth daily.       . furosemide (LASIX) 20 MG tablet Take 20 mg by mouth daily as needed.      . insulin glargine (LANTUS) 100 UNIT/ML injection Inject 20 Units into the skin 2 (two) times daily.       . insulin lispro (HUMALOG) 100 UNIT/ML injection Inject 5 Units into the skin 3 (three) times daily before meals. ONLY IF BS GREATER THAN 110      . MYFORTIC 360 MG TBEC 360 mg 2 (two) times daily.       . nitroGLYCERIN (NITROSTAT) 0.4 MG SL tablet Place 1 tablet (0.4 mg total) under the tongue every 5 (five) minutes as needed for chest pain.  30 tablet  12  . Omega-3 Fatty Acids (FISH OIL) 1200 MG CAPS Take 1 capsule by mouth 2 (two) times daily.      . pravastatin (PRAVACHOL) 40 MG tablet Take 1 tablet (40 mg total) by mouth daily.  30 tablet  6  . PROGRAF 0.5 MG capsule 3 MG IN AM 2MG  IN PM      . sulfamethoxazole-trimethoprim (BACTRIM,SEPTRA) 400-80 MG per tablet Take 1 tablet by mouth every Monday, Wednesday, and Friday.       . TRAVATAN Z 0.004 % SOLN ophthalmic solution Place 1 drop into both eyes at bedtime.       Marland Kitchen warfarin (COUMADIN) 5 MG tablet Take 5 mg by mouth daily. 5 mg on M, Tues, Wed, Fri, and Sat and then 7.5 mg on Thurs and Sunday      .  aspirin EC 81 MG tablet Take 81 mg by mouth daily.       No current facility-administered medications on file prior to visit.   ALLERGIES Crestor; Losartan; and Vitamin d analogs  Past Medical History  Diagnosis Date  . Diabetes mellitus type 2, controlled   . Hyperlipidemia   . BPH (benign prostatic hyperplasia)   . Gout   . CAD (coronary artery disease)     s/p post-op NSTEMI. treated with stent to LAD. at [facility]  . PAF (paroxysmal atrial fibrillation)     s/p DC-CV in 6/13. Off coumadin due to ureteral bleed  . OSA (obstructive sleep apnea)   . 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 HTN (hypertension)   . MI (myocardial infarction) 09/26/2011  . Chronic kidney disease     due IgA nephropathy - s/p kidnet transplant 09/25/11     Review of Systems  All other systems reviewed and are negative.       Objective:   Physical Exam  Nursing note  and vitals reviewed. Constitutional: He is oriented to person, place, and time. He appears well-developed and well-nourished.  HENT:  Head: Normocephalic.  Neck: Normal range of motion.  Cardiovascular: Normal rate, regular rhythm, normal heart sounds and intact distal pulses.   Pulmonary/Chest: Effort normal and breath sounds normal.  Abdominal: Soft.  Musculoskeletal: Normal range of motion.  Lymphadenopathy:    He has no cervical adenopathy.  Neurological: He is alert and oriented to person, place, and time.  Skin: Skin is warm and dry.  Ecchymosis both hands, no change per patient          Assessment & Plan:  1. Afib coumadin prophylaxis will get labs at [facility] Monday with kidney recheck. 2. ADD Refill VyVanse 60 mg AD

## 2013-03-16 LAB — COMPREHENSIVE METABOLIC PANEL
ALT: 43 IU/L (ref 0–44)
AST: 41 IU/L — ABNORMAL HIGH (ref 0–40)
Albumin: 4.3 g/dL (ref 3.6–4.8)
Alkaline Phosphatase: 144 IU/L — ABNORMAL HIGH (ref 39–117)
BUN/Creatinine Ratio: 14 (ref 10–22)
BUN: 16 mg/dL (ref 8–27)
CO2: 25 mmol/L (ref 18–29)
Chloride: 103 mmol/L (ref 97–108)
Sodium: 141 mmol/L (ref 134–144)

## 2013-03-16 LAB — LIPID PANEL
Cholesterol, Total: 158 mg/dL (ref 100–199)
Triglycerides: 65 mg/dL (ref 0–149)

## 2013-03-31 ENCOUNTER — Other Ambulatory Visit: Payer: Medicare Other

## 2013-03-31 DIAGNOSIS — Z7901 Long term (current) use of anticoagulants: Secondary | ICD-10-CM

## 2013-03-31 LAB — PROTIME-INR: Prothrombin Time: 22.1 seconds — ABNORMAL HIGH (ref 11.6–15.2)

## 2013-04-05 ENCOUNTER — Other Ambulatory Visit: Payer: Self-pay | Admitting: Emergency Medicine

## 2013-04-05 MED ORDER — LISDEXAMFETAMINE DIMESYLATE 60 MG PO CAPS: 60.0000 mg | ORAL_CAPSULE | ORAL | Status: DC

## 2013-04-06 ENCOUNTER — Other Ambulatory Visit: Payer: Self-pay | Admitting: Internal Medicine

## 2013-04-07 ENCOUNTER — Other Ambulatory Visit: Payer: Self-pay | Admitting: Internal Medicine

## 2013-04-15 ENCOUNTER — Encounter: Payer: Self-pay | Admitting: Physician Assistant

## 2013-04-15 ENCOUNTER — Ambulatory Visit: Payer: No Typology Code available for payment source | Admitting: Physician Assistant

## 2013-04-15 VITALS — BP 130/70 | HR 72 | Temp 97.5°F | Resp 16 | Ht 67.0 in | Wt 206.0 lb

## 2013-04-15 DIAGNOSIS — I4891 Unspecified atrial fibrillation: Secondary | ICD-10-CM

## 2013-04-15 NOTE — Progress Notes (Signed)
Coumadin follow up  Patient is on Coumadin for Primary Diagnosis: Atrial fibrillation [427.31] Patient's last INR is  Lab Results  Component Value Date   INR 1.98* 03/31/2013   INR 0.98 01/13/2012    INR  Date Value Range Status  03/31/2013 1.98* <1.50 Final     The INR is of principal utility in following patients on stable doses     of oral anticoagulants.  The therapeutic range is generally 2.0 to     3.0, but may be 3.0 to 4.0 in patients with mechanical cardiac valves,     recurrent embolisms and antiphospholipid antibodies (including lupus     inhibitors).   Patient denies SOB, CP, dizziness, nose bleeds, easy bleeding, and blood in stool/urine.  Current Outpatient Prescriptions on File Prior to Visit  Medication Sig Dispense Refill  . ALPRAZolam (XANAX XR) 2 MG 24 hr tablet TAKE 1/2 TO 1 TABLET BY MOUTH TWICE DAILY AS NEEDED FOR ANXIETY  60 tablet  0  . aspirin EC 81 MG tablet Take 81 mg by mouth daily.      . citalopram (CELEXA) 40 MG tablet Take 20 mg by mouth daily.      . fenofibrate micronized (LOFIBRA) 134 MG capsule Take 134 mg by mouth daily.       . finasteride (PROSCAR) 5 MG tablet Take 5 mg by mouth daily.       Marland Kitchen FREESTYLE TEST STRIPS test strip TEST BLOOD SUGAR THREE TIMES DAILY  450 each  PRN  . furosemide (LASIX) 20 MG tablet Take 20 mg by mouth daily as needed.      . insulin glargine (LANTUS) 100 UNIT/ML injection Inject 20 Units into the skin 2 (two) times daily.       . insulin lispro (HUMALOG) 100 UNIT/ML injection Inject 5 Units into the skin 3 (three) times daily before meals. ONLY IF BS GREATER THAN 110      . lisdexamfetamine (VYVANSE) 60 MG capsule Take 1 capsule (60 mg total) by mouth every morning.  30 capsule  0  . [START ON 05/06/2013] lisdexamfetamine (VYVANSE) 60 MG capsule Take 1 capsule (60 mg total) by mouth every morning.  30 capsule  0  . [START ON 06/05/2013] lisdexamfetamine (VYVANSE) 60 MG capsule Take 1 capsule (60 mg total) by mouth every  morning.  30 capsule  0  . MYFORTIC 360 MG TBEC 360 mg 2 (two) times daily.       . nitroGLYCERIN (NITROSTAT) 0.4 MG SL tablet Place 1 tablet (0.4 mg total) under the tongue every 5 (five) minutes as needed for chest pain.  30 tablet  12  . Omega-3 Fatty Acids (FISH OIL) 1200 MG CAPS Take 1 capsule by mouth 2 (two) times daily.      . pravastatin (PRAVACHOL) 40 MG tablet Take 1 tablet (40 mg total) by mouth daily.  30 tablet  6  . PROGRAF 0.5 MG capsule 3 MG IN AM 2MG  IN PM      . sulfamethoxazole-trimethoprim (BACTRIM,SEPTRA) 400-80 MG per tablet Take 1 tablet by mouth every Monday, Wednesday, and Friday.       . TRAVATAN Z 0.004 % SOLN ophthalmic solution Place 1 drop into both eyes at bedtime.       Marland Kitchen warfarin (COUMADIN) 5 MG tablet Take 5 mg by mouth daily. 5 mg on M, Tues, Wed, Fri, and Sat and then 7.5 mg on Thurs and Sunday       No current facility-administered medications on file  prior to visit.   Allergies  Allergen Reactions  . Crestor [Rosuvastatin]     Elevated LFT's  . Losartan   . Vitamin D Analogs     ROS Constitutional: Denies fever, chills, headaches, fatigue. Cardio: Denies chest pain, palpitations, irregular heartbeat, syncope, dyspnea, diaphoresis, orthopnea, PND, claudication, edema Respiratory: denies cough, dyspnea, DOE, pleurisy, hoarseness, laryngitis, wheezing.  Gastrointestinal: Denies dysphagia, heartburn, reflux, pain, cramps, nausea, diarrhea, constipation, hematemesis, melena, hematochezia Genitourinary: Denies dysuria, frequency, hematuria, flank pain Musculoskeletal: Denies arthralgia, myalgia, stiffness, Jt. Swelling, pain, limp, strain/sprain. Skin: Denies rash, ecchymosis, petechial. Neuro: Denies Weakness, tremor, incoordination, spasms, paresthesia, pain Heme/Lymph: Denies Excessive bleeding, bruising, enlarged lymph nodes  Physical: Filed Vitals:   04/15/13 1628  BP: 130/70  Pulse: 72  Temp: 97.5 F (36.4 C)  Resp: 16   Filed Weights    04/15/13 1628  Weight: 206 lb (93.441 kg)    General Appearance: Well nourished, in no apparent distress. ENT/Mouth: Nares clear with no erythema, swelling, mucus on turbinates. No ulcers, cracking, on lips. No erythema, swelling, or exudate on post pharynx.  Neck: Supple, thyroid normal.  Respiratory: CTAB   Cardio: Irregular, irregular rhythm, no murmurs, rubs or gallops. No edema  Abdomen: Soft, with bowl sounds. Non tender, no guarding, rebound, hernias, masses, or organomegaly.  Skin: Warm, dry without rashes, lesions, ecchymosis.  Neuro: Unremarkable  Assessment and plan: Chronic anticoagulation- check INR and will adjust medication according to labs.  Discussed if patient falls to immediately contact office or go to ER. Discussed foods that can increase or decrease Coumadin levels. Patient understands to call the office before starting a new medication. Follow up in one month.

## 2013-04-16 LAB — PROTIME-INR
INR: 1.36 (ref <1.50)
PROTHROMBIN TIME: 16.6 s — AB (ref 11.6–15.2)

## 2013-04-26 ENCOUNTER — Telehealth: Payer: Self-pay | Admitting: *Deleted

## 2013-04-26 NOTE — Telephone Encounter (Signed)
Spoke with patient to review labs. 

## 2013-05-17 ENCOUNTER — Ambulatory Visit (INDEPENDENT_AMBULATORY_CARE_PROVIDER_SITE_OTHER): Payer: Medicare Other | Admitting: Internal Medicine

## 2013-05-17 ENCOUNTER — Encounter: Payer: Self-pay | Admitting: Internal Medicine

## 2013-05-17 ENCOUNTER — Other Ambulatory Visit: Payer: Self-pay | Admitting: Physician Assistant

## 2013-05-17 VITALS — BP 138/68 | HR 68 | Temp 97.5°F | Resp 16 | Ht 67.0 in | Wt 198.0 lb

## 2013-05-17 DIAGNOSIS — I10 Essential (primary) hypertension: Secondary | ICD-10-CM

## 2013-05-17 DIAGNOSIS — Z7901 Long term (current) use of anticoagulants: Secondary | ICD-10-CM

## 2013-05-17 DIAGNOSIS — E782 Mixed hyperlipidemia: Secondary | ICD-10-CM

## 2013-05-17 DIAGNOSIS — E119 Type 2 diabetes mellitus without complications: Secondary | ICD-10-CM

## 2013-05-17 DIAGNOSIS — Z79899 Other long term (current) drug therapy: Secondary | ICD-10-CM

## 2013-05-17 DIAGNOSIS — E785 Hyperlipidemia, unspecified: Secondary | ICD-10-CM

## 2013-05-17 DIAGNOSIS — E559 Vitamin D deficiency, unspecified: Secondary | ICD-10-CM

## 2013-05-17 LAB — CBC WITH DIFFERENTIAL/PLATELET
BASOS ABS: 0 10*3/uL (ref 0.0–0.1)
BASOS PCT: 1 % (ref 0–1)
Eosinophils Absolute: 0.1 10*3/uL (ref 0.0–0.7)
Eosinophils Relative: 2 % (ref 0–5)
HCT: 44.2 % (ref 39.0–52.0)
Hemoglobin: 15.1 g/dL (ref 13.0–17.0)
Lymphocytes Relative: 16 % (ref 12–46)
Lymphs Abs: 0.8 10*3/uL (ref 0.7–4.0)
MCH: 31.2 pg (ref 26.0–34.0)
MCHC: 34.2 g/dL (ref 30.0–36.0)
MCV: 91.3 fL (ref 78.0–100.0)
MONO ABS: 0.4 10*3/uL (ref 0.1–1.0)
Monocytes Relative: 8 % (ref 3–12)
NEUTROS ABS: 3.7 10*3/uL (ref 1.7–7.7)
Neutrophils Relative %: 73 % (ref 43–77)
Platelets: 110 10*3/uL — ABNORMAL LOW (ref 150–400)
RBC: 4.84 MIL/uL (ref 4.22–5.81)
RDW: 13.4 % (ref 11.5–15.5)
WBC: 5 10*3/uL (ref 4.0–10.5)

## 2013-05-17 MED ORDER — PRAVASTATIN SODIUM 40 MG PO TABS: 40.0000 mg | ORAL_TABLET | Freq: Every day | ORAL | Status: DC

## 2013-05-17 NOTE — Progress Notes (Signed)
Patient ID: Robert Arnold, male   DOB: [birth year], 68 y.o.   MRN: [id number]   This very nice 68 y.o. MWM 68 y.o. 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    HTN predates since 1998. BP has been controlled at home. Today's BP: 138/68 mmHg . 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  Renal and immune monitoring is followed by Dr Radene Knee at the [facility]. Last BUN/Cr 19/2.11 with calc GFR 32.Patient denies any cardiac type chest pain, palpitations, dyspnea/orthopnea/PND, dizziness, claudication, or dependent edema.   Hyperlipidemia is controlled with diet & meds. Last Cholesterol was 166, Triglycerides were 81, HDL 62 and LDL 88. Patient denies myalgias or other med SE's.    Also, the patient has history of T1 IDDM since 2003 with last A1c of 6.0% in Oct 2014.  He currently is on bid Lantus and covers SS Humalog tid ac with glucoses usually ranging betw 90-110 mg%. Patient denies any symptoms of reactive hypoglycemia, diabetic polys, paresthesias or visual blurring.   Further, Patient has history of Vitamin D Deficiency of 24 in @012with  last vitamin D of 25 in Oct 2014.Marland Kitchen Patient supplements vitamin D without any suspected side-effects.    Medication List       ALPRAZolam 2 MG 24 hr tablet  Commonly known as:  XANAX XR  TAKE 1/2 TO 1 TABLET BY MOUTH TWICE DAILY AS NEEDED FOR ANXIETY     buPROPion 300 MG 24 hr tablet  Commonly known as:  WELLBUTRIN XL     citalopram 40 MG tablet  Commonly known as:  CELEXA  Take 20 mg by mouth daily.     finasteride 5 MG tablet  Commonly known as:  PROSCAR  Take 5 mg by mouth daily.     Fish Oil 1200 MG Caps  Take 1 capsule by mouth 2 (two) times daily.     FREESTYLE TEST STRIPS test strip  Generic drug:  glucose blood   TEST BLOOD SUGAR THREE TIMES DAILY     FREESTYLE LITE test strip  Generic drug:  glucose blood     furosemide 20 MG tablet  Commonly known as:  LASIX  Take 20 mg by mouth daily as needed.     ILEVRO 0.3 % Susp  Generic drug:  Nepafenac     insulin glargine 100 UNIT/ML injection  Commonly known as:  LANTUS  Inject 20 Units into the skin 2 (two) times daily.     insulin lispro 100 UNIT/ML injection  Commonly known as:  HUMALOG  Inject 5 Units into the skin 3 (three) times daily before meals. ONLY IF BS GREATER THAN 110     lisdexamfetamine 60 MG capsule  Commonly known as:  VYVANSE  Take 1 capsule (60 mg total) by mouth every morning.     lisdexamfetamine 60 MG capsule  Commonly known as:  VYVANSE  Take 1 capsule (60 mg total) by mouth every morning.     lisdexamfetamine 60 MG capsule  Commonly known as:  VYVANSE  Take 1 capsule (60 mg total) by mouth every morning.  Start taking on:  06/05/2013     MYFORTIC 360 MG Tbec EC tablet  Generic drug:  mycophenolate  360 mg 2 (two) times daily.     nitroGLYCERIN 0.4  MG SL tablet  Commonly known as:  NITROSTAT  Place 1 tablet (0.4 mg total) under the tongue every 5 (five) minutes as needed for chest pain.     pravastatin 40 MG tablet  Commonly known as:  PRAVACHOL  Take 1 tablet (40 mg total) by mouth daily.     PROGRAF 0.5 MG capsule  Generic drug:  tacrolimus  3 MG IN AM 2MG  IN PM     sulfamethoxazole-trimethoprim 400-80 MG per tablet  Commonly known as:  BACTRIM,SEPTRA  Take 1 tablet by mouth every Monday, Wednesday, and Friday.     TRAVATAN Z 0.004 % Soln ophthalmic solution  Generic drug:  Travoprost (BAK Free)  Place 1 drop into both eyes at bedtime.     Vitamin D (Ergocalciferol) 50000 UNITS Caps capsule  Commonly known as:  DRISDOL     warfarin 5 MG tablet  Commonly known as:  COUMADIN  Take 5 mg by mouth daily. 5 mg on M, Tues, Wed, Fri, and Sat and then 7.5 mg on Thurs and Sunday         Allergies   Allergen Reactions  . Crestor [Rosuvastatin]     Elevated LFT\'s  . Losartan   . Vitamin D Analogs     PMHx:   Past Medical History  Diagnosis Date  . Diabetes mellitus type 2, controlled   . Hyperlipidemia   . BPH (benign prostatic hyperplasia)   . Gout   . CAD (coronary artery disease)     s/p post-op NSTEMI. treated with stent to LAD. at [facility]  . PAF (paroxysmal atrial fibrillation)     s/p DC-CV in 6/13. Off coumadin due to ureteral bleed  . OSA (obstructive sleep apnea)   . 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 HTN (hypertension)   . MI (myocardial infarction) 09/26/2011  . 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 Eyes: Denies redness, blurred vision, diplopia, discharge, itchy, watery eyes.  ENT: Denies discharge, congestion, post nasal drip, epistaxis, sore throat, earache, hearing loss, dental pain, tinnitus, vertigo, sinus pain, snoring.  CV: Denies chest pain, palpitations, irregular heartbeat, syncope, dyspnea, diaphoresis, orthopnea, PND, claudication, edema. Respiratory: denies cough, dyspnea, DOE, pleurisy, hoarseness, laryngitis, wheezing.  Gastrointestinal: Denies dysphagia, odynophagia, heartburn, reflux, water brash, abdominal pain or cramps, nausea, vomiting, bloating, diarrhea, constipation, hematemesis, melena, hematochezia,  or hemorrhoids. Genitourinary: Denies dysuria, frequency, urgency, nocturia, hesitancy, discharge, hematuria, flank pain. Musculoskeletal: Denies arthralgias, myalgias, stiffness, jt. swelling, pain, limp, strain/sprain.  Skin: Denies pruritus, rash, hives, warts, acne, eczema, change in skin lesion(s). Neuro: No weakness, tremor, incoordination, spasms, paresthesia, or  pain. Psychiatric: Denies confusion, memory loss, or sensory loss. Endo: Denies change in weight, skin, hair change.  Heme/Lymph: No excessive bleeding, bruising, orenlarged lymph nodes.  BP: 138/68  Pulse: 68  Temp: 97.5 F (36.4 C)  Resp: 16    BMI   31 kg/(m^2)   Height a      5\' 7"    Weight      19 8 lb   On Exam: Appears well nourished - in no distress. Eyes: PERRLA, EOMs, conjunctiva no swelling or erythema. Sinuses: No frontal/maxillary tenderness ENT/Mouth: EAC's clear, TM's nl w/o erythema, bulging. Nares clear w/o erythema,  swelling, exudates. Oropharynx clear without erythema or exudates. Oral hygiene is good. Tongue normal, non obstructing. Hearing intact.  Neck: Supple. Thyroid nl. Car 2+/2+ without bruits, nodes or JVD. Chest:  Median Sternotomy scar. Respirations nl with BS clear & equal w/o rales, rhonchi, wheezing or stridor.  Cor: Heart sounds normal w/ regular rate and rhythm without sig. murmurs, gallops, clicks, or rubs. Peripheral pulses normal and equal  without edema.  Abdomen: Midline vertical abdominal scar. Soft & bowel sounds normal. Non-tender w/o guarding, rebound, hernias, masses, or organomegaly.  Lymphatics: Unremarkable.  Gu : No Ing hernia. Testes atrophic. DRE - prostate 1-2 (+) smooth & firm w/o nodules. Neg Hemmocult. Musculoskeletal: Full ROM all peripheral extremities, joint stability, 5/5 strength, and normal gait.  Skin: Warm, dry without exposed rashes, lesions, ecchymosis apparent.  Neuro: Cranial nerves intact, reflexes equal bilaterally. Sensory-motor testing grossly intact. Tendon reflexes grossly intact.  Pysch: Alert & oriented x 3. Insight and judgement nl & appropriate. No ideations.  Assessment and Plan:  1. Hypertension - Continue monitor blood pressure at home. Continue diet/meds same.  2. Hyperlipidemia - Continue diet/meds, exercise,& lifestyle modifications. Continue monitor periodic cholesterol/liver & renal functions    3. T1 IDDM - continue recommend prudent low glycemic diet, weight control, regular exercise, diabetic monitoring and periodic eye exams.  4. Vitamin D Deficiency - Continue supplementation.  5. ASHD S/P CABG  6. CKD S/P living donor transplant  Recommended regular exercise, BP monitoring, weight control, and discussed med and SE's. Recommended labs to assess and monitor clinical status. Further disposition pending results of labs.

## 2013-05-17 NOTE — Patient Instructions (Signed)

## 2013-05-18 LAB — BASIC METABOLIC PANEL WITHOUT GFR
BUN: 15 mg/dL (ref 6–23)
CO2: 31 meq/L (ref 19–32)
Calcium: 9.8 mg/dL (ref 8.4–10.5)
Chloride: 104 meq/L (ref 96–112)
Creat: 1.11 mg/dL (ref 0.50–1.35)
GFR, Est African American: 80 mL/min
GFR, Est Non African American: 69 mL/min
Glucose, Bld: 129 mg/dL — ABNORMAL HIGH (ref 70–99)
Potassium: 4.6 meq/L (ref 3.5–5.3)
Sodium: 140 meq/L (ref 135–145)

## 2013-05-18 LAB — PROTIME-INR
INR: 2.63 — ABNORMAL HIGH
Prothrombin Time: 27.4 s — ABNORMAL HIGH (ref 11.6–15.2)

## 2013-05-18 LAB — LIPID PANEL
CHOLESTEROL: 164 mg/dL (ref 0–200)
HDL: 57 mg/dL (ref >39)
LDL CALC: 76 mg/dL (ref 0–99)
Total CHOL/HDL Ratio: 2.9 Ratio
Triglycerides: 153 mg/dL — ABNORMAL HIGH (ref <150)
VLDL: 31 mg/dL (ref 0–40)

## 2013-05-18 LAB — HEPATIC FUNCTION PANEL
ALT: 34 U/L (ref 0–53)
AST: 31 U/L (ref 0–37)
Albumin: 4.2 g/dL (ref 3.5–5.2)
Alkaline Phosphatase: 101 U/L (ref 39–117)
BILIRUBIN INDIRECT: 0.6 mg/dL (ref 0.2–1.2)
Bilirubin, Direct: 0.2 mg/dL (ref 0.0–0.3)
TOTAL PROTEIN: 6.4 g/dL (ref 6.0–8.3)
Total Bilirubin: 0.8 mg/dL (ref 0.2–1.2)

## 2013-05-18 LAB — TSH: TSH: 1.233 u[IU]/mL (ref 0.350–4.500)

## 2013-05-18 LAB — HEMOGLOBIN A1C
HEMOGLOBIN A1C: 6.1 % — AB (ref <5.7)
Mean Plasma Glucose: 128 mg/dL — ABNORMAL HIGH (ref <117)

## 2013-05-18 LAB — MAGNESIUM: Magnesium: 1.4 mg/dL — ABNORMAL LOW (ref 1.5–2.5)

## 2013-05-18 LAB — VITAMIN D 25 HYDROXY (VIT D DEFICIENCY, FRACTURES): VIT D 25 HYDROXY: 28 ng/mL — AB (ref 30–89)

## 2013-05-20 ENCOUNTER — Other Ambulatory Visit: Payer: Self-pay | Admitting: Internal Medicine

## 2013-06-16 ENCOUNTER — Ambulatory Visit (INDEPENDENT_AMBULATORY_CARE_PROVIDER_SITE_OTHER): Payer: No Typology Code available for payment source | Admitting: Physician Assistant

## 2013-06-16 ENCOUNTER — Encounter: Payer: Self-pay | Admitting: Physician Assistant

## 2013-06-16 VITALS — BP 138/80 | HR 72 | Temp 97.7°F | Resp 16 | Ht 67.0 in | Wt 199.0 lb

## 2013-06-16 DIAGNOSIS — I4891 Unspecified atrial fibrillation: Secondary | ICD-10-CM

## 2013-06-16 DIAGNOSIS — I480 Paroxysmal atrial fibrillation: Secondary | ICD-10-CM

## 2013-06-16 NOTE — Progress Notes (Signed)
Coumadin follow up  Patient is on Coumadin for Primary Diagnosis: PAF (paroxysmal atrial fibrillation) [427.31] Patient's last INR is  Lab Results  Component Value Date   INR 2.63* 05/17/2013   INR 1.36 04/15/2013   INR 1.98* 03/31/2013    Patient denies SOB, CP, dizziness,  easy bleeding, and blood in stool/urine. Occ nose bleeds in the winter, common.  His coumadin dose was changed last visit. 5mg  M- F and 7.5mg  S, S.    Current Outpatient Prescriptions on File Prior to Visit  Medication Sig Dispense Refill  . ALPRAZolam (XANAX XR) 2 MG 24 hr tablet TAKE 1/2 TO 1 TABLET BY MOUTH TWICE DAILY AS NEEDED FOR ANXIETY  60 tablet  0  . buPROPion (WELLBUTRIN XL) 300 MG 24 hr tablet       . citalopram (CELEXA) 40 MG tablet Take 20 mg by mouth daily.      . finasteride (PROSCAR) 5 MG tablet Take 5 mg by mouth daily.       Marland Kitchen FREESTYLE LITE test strip       . FREESTYLE TEST STRIPS test strip TEST BLOOD SUGAR THREE TIMES DAILY  450 each  PRN  . furosemide (LASIX) 20 MG tablet Take 20 mg by mouth daily as needed.      . ILEVRO 0.3 % SUSP       . insulin glargine (LANTUS) 100 UNIT/ML injection Inject 20 Units into the skin 2 (two) times daily.       . insulin lispro (HUMALOG) 100 UNIT/ML injection Inject 5 Units into the skin 3 (three) times daily before meals. ONLY IF BS GREATER THAN 110      . LANTUS SOLOSTAR 100 UNIT/ML Solostar Pen INJECT 100 UNITS DAILY AS DIRECTED  30 mL  2  . lisdexamfetamine (VYVANSE) 60 MG capsule Take 1 capsule (60 mg total) by mouth every morning.  30 capsule  0  . lisdexamfetamine (VYVANSE) 60 MG capsule Take 1 capsule (60 mg total) by mouth every morning.  30 capsule  0  . lisdexamfetamine (VYVANSE) 60 MG capsule Take 1 capsule (60 mg total) by mouth every morning.  30 capsule  0  . MYFORTIC 360 MG TBEC 360 mg 2 (two) times daily.       . nitroGLYCERIN (NITROSTAT) 0.4 MG SL tablet Place 1 tablet (0.4 mg total) under the tongue every 5 (five) minutes as needed for chest  pain.  30 tablet  12  . Omega-3 Fatty Acids (FISH OIL) 1200 MG CAPS Take 1 capsule by mouth 2 (two) times daily.      . pravastatin (PRAVACHOL) 40 MG tablet Take 1 tablet (40 mg total) by mouth daily.  90 tablet  0  . PROGRAF 0.5 MG capsule 2 am and 2 pm      . sulfamethoxazole-trimethoprim (BACTRIM,SEPTRA) 400-80 MG per tablet Take 1 tablet by mouth every Monday, Wednesday, and Friday.       . TRAVATAN Z 0.004 % SOLN ophthalmic solution Place 1 drop into both eyes at bedtime.       Marland Kitchen warfarin (COUMADIN) 5 MG tablet Take 5 mg by mouth daily. 5 mg on M, Tues, Wed, Fri, and Sat and then 7.5 mg on Thurs and Sunday       No current facility-administered medications on file prior to visit.   Past Medical History  Diagnosis Date  . Diabetes mellitus type 2, controlled   . Hyperlipidemia   . BPH (benign prostatic hyperplasia)   . Gout   .  CAD (coronary artery disease)     s/p post-op NSTEMI. treated with stent to LAD. at [facility]  . PAF (paroxysmal atrial fibrillation)     s/p DC-CV in 6/13. Off coumadin due to ureteral bleed  . OSA (obstructive sleep apnea)   . 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 HTN (hypertension)   . MI (myocardial infarction) 09/26/2011  . 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 Crestor [Rosuvastatin]     Elevated LFT's  . Losartan   . Vitamin D Analogs     ROS Constitutional: Denies fever, chills, headaches, fatigue. Cardio: Denies chest pain, palpitations, irregular heartbeat, syncope, dyspnea, diaphoresis, orthopnea, PND, claudication, edema Respiratory: denies cough, dyspnea, DOE, pleurisy, hoarseness, laryngitis, wheezing.  Gastrointestinal: Denies dysphagia, heartburn, reflux, pain, cramps, nausea, diarrhea, constipation, hematemesis, melena, hematochezia Genitourinary: Denies dysuria, frequency, hematuria, flank pain Musculoskeletal: Denies  arthralgia, myalgia, stiffness, Jt. Swelling, pain, limp, strain/sprain. Skin: Denies rash, ecchymosis, petechial. Neuro: Denies Weakness, tremor, incoordination, spasms, paresthesia, pain Heme/Lymph: Denies Excessive bleeding, bruising, enlarged lymph nodes  Physical: Filed Vitals:   06/16/13 1631  BP: 138/80  Pulse: 72  Temp: 97.7 F (36.5 C)  Resp: 16   Filed Weights   06/16/13 1631  Weight: 199 lb (90.266 kg)    General Appearance: Well nourished, in no apparent distress. ENT/Mouth: Nares clear with no erythema, swelling, mucus on turbinates. No ulcers, cracking, on lips. No erythema, swelling, or exudate on post pharynx.  Neck: Supple, thyroid normal.  Respiratory: CTAB   Cardio:RRR, with 3/6 holosystolic murmu without rubs or gallops. No edema  Abdomen: Soft, with bowl sounds. Non tender, no guarding, rebound, hernias, masses, or organomegaly.  Skin: Warm, dry without rashes, lesions, ecchymosis.  Neuro: Unremarkable  Assessment and plan: Chronic anticoagulation- check INR and will adjust medication according to labs.  Discussed if patient falls to immediately contact office or go to ER. Discussed foods that can increase or decrease Coumadin levels. Patient understands to call the office before starting a new medication. Follow up in one month.   Patient has started to see Dr. Posey Pronto for his kidney's and will be getting lab work with him monthly and would like to have his INR checked there, I have informed him we are fine with this as long as he comes here every 3 months for regular follow up for BP, Hyperlipidemia and DM labs.

## 2013-06-16 NOTE — Patient Instructions (Signed)
Your PT/INR is the test we use to check your coumadin level.  Your goal for your INR is between 2-3.  If you number is below 2 than your blood is thick and you need more coumadin. You are at risk for stroke or clotting during this time period.  If you number is above 3 than your blood is too thin and you need less coumadin or can eat more greens at this time. You are at risk for stomach or head bleeds and need to be careful.   Warfarin Coagulopathy Warfarin (Coumadin) coagulopathy refers to bleeding that may occur as a complication of the medicine warfarin. Warfarin is an oral blood thinner (anticoagulant). Warfarin is used for medical conditions where thinning of the blood is needed to prevent blood clots.  CAUSES Bleeding is the most common and most serious complication of warfarin. The amount of bleeding is related to the warfarin dose and length of treatment. In addition, bleeding complications can also occur due to:  Intentional or accidental warfarin overdose.  Underlying medical conditions.  Dietary changes.  Medicine, herbal, supplement, or alcohol interactions. SYMPTOMS Severe bleeding while on warfarin may occur from any tissue or organ. Symptoms of the blood being too thin may include:  Bleeding from the nose or gums.  Blood in bowel movements which may appear as bright red, dark, or black tarry stools.  Blood in the urine which may appear as pink, red, or brown urine.  Unusual bruising or bruising easily.  A cut that does not stop bleeding within 10 minutes.  Vomiting blood or continuous nausea for more than 1 day.  Coughing up blood.  Broken blood vessels in your eye (subconjunctival hemorrhage).  Abdominal or back pain with or without flank bruising.  Sudden, severe headache.  Sudden weakness or numbness of the face, arm, or leg, especially on one side of the body.  Sudden confusion.  Trouble speaking (aphasia) or understanding.  Sudden trouble seeing in  one or both eyes.  Sudden trouble walking.  Dizziness.  Loss of balance or coordination.  Vaginal bleeding.  Swelling or pain at an injection site.  Superficial fat tissue death (necrosis) which may cause skin scarring. This is more common in women and may first present as pain in the waist, thighs, and buttocks.  Fever. HOME CARE INSTRUCTIONS  Always contact your caregiver of any concerns or signs of possible warfarin coagulopathy as soon as possible.  Take warfarin exactly as directed by your caregiver. It is recommended that you take your warfarin dose at the same time of the day. It is preferred that you take warfarin in the late afternoon. If you have been told to stop taking warfarin, do not resume taking warfarin until directed to do so by your caregiver. Follow your caregiver's instructions if you accidentally take an extra dose or miss a dose of warfarin. It is very important to take warfarin as directed since bleeding or blood clots could result in chronic or permanent injury, pain, or disability.  Keep all follow-up appointments with your caregiver as directed. It is very important to keep your appointments. Not keeping appointments could result in a chronic or permanent injury, pain, or disability because warfarin is a medicine that requires close monitoring.  While taking warfarin, you will need to have regular blood tests to measure your blood clotting time. These blood tests usually include both the prothrombin time (PT) and International Normalized Ratio (INR) tests. The PT and INR results allow your caregiver to adjust  your dose of warfarin. The dose can change for many reasons. It is critically important that you have your PT and INR levels drawn exactly as directed. PT and INR lab draws are usually done in the morning. Your warfarin dose may stay the same or change depending on what the PT and INR results are. Be sure to follow up with your caregiver regarding your PT and  INR test results and what your warfarin dosage should be.  Many medicines can interfere with warfarin and affect the PT and INR results. You must tell your caregiver about any and all medicines you take, this includes all vitamins and supplements. Ask your caregiver before taking these. Prescription and over-the-counter medicine consistency is critical to warfarin management. It is important that potential interactions are checked before you start a new medicine. Be especially cautious with aspirin and anti-inflammatory medicines. Ask your caregiver before taking these. Medicines such as antibiotics and acid-reducing medicine can interact with warfarin and can cause an increased warfarin effect. Warfarin can also interfere with the effectiveness of medicines you are taking. Do not take or discontinue any prescribed or over-the-counter medicine except on the advice of your caregiver or pharmacist.  Some vitamins, supplements, and herbal products interfere with the effectiveness of warfarin. Vitamin E may increase the anticoagulant effects of warfarin. Vitamin K may can cause warfarin to be less effective. Do not take or discontinue any vitamin, supplement, or herbal product except on the advice of your caregiver or pharmacist.  Some foods, especially foods high in vitamin K can interfere with the effectiveness of warfarin and affect the PT and INR results. A diet too high in vitamin K can cause warfarin to be less effective. A diet too low in foods containing vitamin K may lead to an excessive warfarin effect. Foods high in vitamin K include spinach, kale, broccoli, cabbage, collard and turnip greens, brussels sprouts, peas, cauliflower, seaweed, and parsley as well as beef and pork liver, green tea, and soybean oil. Eat what you normally eat and keep the vitamin K content of your diet consistent. Avoid major changes in your diet, or notify your caregiver before changing your diet. Arrange a visit with a  dietitian to answer your questions.  If you have a loss of appetite or get the stomach flu (viral gastroenteritis), talk to your caregiver as soon as possible. A decrease in your normal vitamin K intake can make you more sensitive to your usual dose of warfarin.  Some medical conditions may increase your risk for bleeding while you are taking warfarin. A fever, diarrhea lasting more than a day, worsening heart failure, or worsening liver function are some medical conditions that could affect warfarin. Contact your caregiver if you have any of these medical conditions.  Be careful not to cut yourself when using sharp objects or while shaving.  Alcohol can change the body's ability to handle warfarin. It is best to avoid alcoholic drinks or consume only very small amounts while taking warfarin. Notify your caregiver if you change your alcohol intake. A sudden increase in alcohol use can increase your risk of bleeding. Chronic alcohol use can cause warfarin to be less effective.  Limit physical activities or sports that could result in a fall or cause injury.  Do not use warfarin if you are pregnant.  Inform all your caregivers and your dentist that you take warfarin.  Inform all caregivers if you are taking warfarin and aspirin or platelet inhibitor medicines such as clopidogrel, ticagrelor, or  prasugrel. Use of these medicines in conjunction with warfarin can increase your risk of bleeding or death. Taking these medicines together should only be done under the direct care of your caregiver. SEEK IMMEDIATE MEDICAL CARE IF:  You cough up blood.  You have dark or black stools or there is bright red blood coming from your rectum.  You vomit blood or have nausea for more than 1 day.  You have blood in the urine or pink colored urine.  You have unusual bruising or have increased bruising.  You have bleeding from the nose or gums that does not stop quickly.  You have a cut that does not stop  bleeding within a 2 3 minutes.  You have sudden weakness or numbness of the face, arm, or leg, especially on one side of the body.  You have sudden confusion.  You have trouble speaking (aphasia) or understanding.  You have sudden trouble seeing in one or both eyes.  You have sudden trouble walking.  You have dizziness.  You have a loss of balance or coordination.  You have a sudden, severe headache.  You have a serious fall or head injury, even if you are not bleeding.  You have swelling or pain at an injection site.  You have unexplained tenderness or pain in the abdomen, back, waist, thighs or buttocks.  You have a fever. Any of these symptoms may represent a serious problem that is an emergency. Do not wait to see if the symptoms will go away. Get medical help right away. Call your local emergency services (911 in [place] Do not drive yourself to the hospital. Document Released: 03/03/2006 Document Revised: 09/24/2011 Document Reviewed: 09/03/2011 [facility] Patient Information 2014 [place].

## 2013-06-17 ENCOUNTER — Encounter: Payer: Self-pay | Admitting: Physician Assistant

## 2013-06-17 LAB — PROTIME-INR
INR: 1.78 — ABNORMAL HIGH (ref <1.50)
PROTHROMBIN TIME: 20.3 s — AB (ref 11.6–15.2)

## 2013-07-02 ENCOUNTER — Other Ambulatory Visit: Payer: Self-pay | Admitting: Emergency Medicine

## 2013-07-02 ENCOUNTER — Encounter: Payer: Self-pay | Admitting: Internal Medicine

## 2013-07-02 MED ORDER — GLUCOSE BLOOD VI STRP: ORAL_STRIP | Status: DC

## 2013-07-08 ENCOUNTER — Encounter: Payer: Self-pay | Admitting: Internal Medicine

## 2013-07-09 ENCOUNTER — Other Ambulatory Visit: Payer: Self-pay | Admitting: Emergency Medicine

## 2013-07-12 ENCOUNTER — Other Ambulatory Visit: Payer: Self-pay | Admitting: Internal Medicine

## 2013-07-12 MED ORDER — HYDROXYZINE HCL 25 MG PO TABS: ORAL_TABLET | ORAL | Status: DC

## 2013-07-13 ENCOUNTER — Other Ambulatory Visit: Payer: Self-pay | Admitting: Emergency Medicine

## 2013-07-20 ENCOUNTER — Ambulatory Visit: Payer: Self-pay | Admitting: Emergency Medicine

## 2013-07-21 ENCOUNTER — Ambulatory Visit (INDEPENDENT_AMBULATORY_CARE_PROVIDER_SITE_OTHER): Payer: Medicare Other | Admitting: Internal Medicine

## 2013-07-21 ENCOUNTER — Encounter: Payer: Self-pay | Admitting: Internal Medicine

## 2013-07-21 VITALS — BP 138/72 | HR 72 | Temp 97.4°F | Resp 16 | Ht 67.75 in | Wt 192.4 lb

## 2013-07-21 DIAGNOSIS — R1031 Right lower quadrant pain: Secondary | ICD-10-CM

## 2013-07-21 DIAGNOSIS — G8929 Other chronic pain: Secondary | ICD-10-CM

## 2013-07-21 NOTE — Progress Notes (Signed)
Subjective:    Patient ID: Robert Arnold, male    DOB: Jun 21, 1945, 68 y.o.   MRN: [id number]  HPI Patient is a very nice Renal Transplant patient (09/2011) with multiple co-morbidities and multiple meds who out of town for a family event went to [facility] with c/o vague fullness in his RLQ. In the ERfo 12 hours he had nl blood tests with INR=3.1 and reported neg Abd/Pelvic U/S and non contrasted Abd CTscan. He denies any GI Sx's as N,V, D, C or change in BM's and reports urination has been normal w/o noted change in urine. Current Outpatient Prescriptions on File Prior to Visit  Medication Sig Dispense Refill  . buPROPion (WELLBUTRIN XL) 300 MG 24 hr tablet       . Cholecalciferol (VITAMIN D PO) Take 1,000 Int'l Units by mouth daily.      . citalopram (CELEXA) 40 MG tablet Take 20 mg by mouth daily.      . finasteride (PROSCAR) 5 MG tablet Take 5 mg by mouth daily.       Marland Kitchen FREESTYLE LITE test strip       . glucose blood (FREESTYLE TEST STRIPS) test strip CHECKS BS tid-qid FOR MEDICATION ADJUSTMENT, DX- 250.01 NPI [id number]  450 each  PRN  . hydrOXYzine (ATARAX/VISTARIL) 25 MG tablet Take 1 to 2 tablets 2 to 2m times daily as needed for anxiety  120 tablet  3  . insulin glargine (LANTUS) 100 UNIT/ML injection Inject 20 Units into the skin 2 (two) times daily.       . insulin lispro (HUMALOG) 100 UNIT/ML injection Inject 5 Units into the skin 3 (three) times daily before meals. ONLY IF BS GREATER THAN 110      . lisdexamfetamine (VYVANSE) 60 MG capsule Take 1 capsule (60 mg total) by mouth every morning.  30 capsule  0  . lisdexamfetamine (VYVANSE) 60 MG capsule Take 1 capsule (60 mg total) by mouth every morning.  30 capsule  0  . lisdexamfetamine (VYVANSE) 60 MG capsule Take 1 capsule (60 mg total) by mouth every morning.  30 capsule  0  . MYFORTIC 360 MG TBEC 360 mg 2 (two) times daily.       . Omega-3 Fatty Acids (FISH OIL) 1200 MG CAPS Take 1 capsule by mouth 2 (two) times  daily.      . pravastatin (PRAVACHOL) 40 MG tablet Take 1 tablet (40 mg total) by mouth daily.  90 tablet  0  . PROGRAF 0.5 MG capsule 2 am and 2 pm      . sulfamethoxazole-trimethoprim (BACTRIM,SEPTRA) 400-80 MG per tablet Take 1 tablet by mouth every Monday, Wednesday, and Friday.       . TRAVATAN Z 0.004 % SOLN ophthalmic solution Place 1 drop into both eyes at bedtime.       Marland Kitchen warfarin (COUMADIN) 5 MG tablet Take 5 mg by mouth daily. 5 mg on M, Tues, Wed, Fri, and Sat and then 7.5 mg on Thurs and Sunday       No current facility-administered medications on file prior to visit.   Allergies  Allergen Reactions  . Crestor [Rosuvastatin]     Elevated LFT's  . Losartan   . Vitamin D Analogs    Past Medical History  Diagnosis Date  . Diabetes mellitus type 2, controlled   . Hyperlipidemia   . BPH (benign prostatic hyperplasia)   . Gout   . CAD (coronary artery disease)  s/p post-op NSTEMI. treated with stent to LAD. [facility]  . PAF (paroxysmal atrial fibrillation)     s/p DC-CV in 6/13. Off coumadin due to ureteral bleed  . OSA (obstructive sleep apnea)   . 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 HTN (hypertension)   . MI (myocardial infarction) 09/26/2011  . 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  A full 10 point Review of Systems was done, except as stated  above, all other Review of Systems were negative  Objective:   Physical Exam  BP 138/72  Pulse 72  Temp(Src) 97.4 F (36.3 C) (Temporal)  Resp 16  Ht 5' 7.75" (1.721 m)  Wt 192 lb 6.4 oz (87.272 kg)  BMI 29.47 kg/m2  HEENT - Eac's patent. TM's Nl.EOM's full. PERRLA. NasoOroPharynx clear. Neck - supple. Nl Thyroid. No bruits nodes JVD Chest - Clear equal BS Cor - Nl HS. RRR w/o sig MGR. PP 1(+) No edema. Abd - No palpable organomegaly, masses or tenderness. BS nl. MS- FROM. w/o deformities. Muscle power tone and bulk Nl. Gait Nl. Neuro - No obvious Cr N abnormalities. Sensory, motor and Cerebellar functions appear Nl w/o focal abnormalities.  Assessment & Plan:   1. Abdominal pain, chronic, right lower quadrant  - Urine U/A  &  C&S

## 2013-07-22 LAB — URINALYSIS, MICROSCOPIC ONLY
BACTERIA UA: NONE SEEN
CASTS: NONE SEEN
Crystals: NONE SEEN
Squamous Epithelial / LPF: NONE SEEN

## 2013-07-23 ENCOUNTER — Encounter: Payer: Self-pay | Admitting: Internal Medicine

## 2013-07-29 ENCOUNTER — Encounter: Payer: Self-pay | Admitting: Internal Medicine

## 2013-07-29 ENCOUNTER — Other Ambulatory Visit: Payer: Self-pay | Admitting: Emergency Medicine

## 2013-07-29 MED ORDER — GLUCOSE BLOOD VI STRP: ORAL_STRIP | Status: DC

## 2013-08-10 ENCOUNTER — Other Ambulatory Visit: Payer: Self-pay | Admitting: Internal Medicine

## 2013-08-10 MED ORDER — HYDROXYZINE HCL 25 MG PO TABS: ORAL_TABLET | ORAL | Status: DC

## 2013-08-11 ENCOUNTER — Encounter (HOSPITAL_COMMUNITY): Payer: Self-pay

## 2013-08-12 ENCOUNTER — Other Ambulatory Visit: Payer: Self-pay | Admitting: *Deleted

## 2013-08-12 ENCOUNTER — Other Ambulatory Visit: Payer: Self-pay | Admitting: Internal Medicine

## 2013-08-12 MED ORDER — BUPROPION HCL ER (XL) 300 MG PO TB24: 300.0000 mg | ORAL_TABLET | ORAL | Status: DC

## 2013-08-17 ENCOUNTER — Encounter: Payer: Self-pay | Admitting: Internal Medicine

## 2013-08-17 ENCOUNTER — Ambulatory Visit (INDEPENDENT_AMBULATORY_CARE_PROVIDER_SITE_OTHER): Payer: Medicare Other | Admitting: Internal Medicine

## 2013-08-17 VITALS — BP 140/78 | HR 70 | Temp 98.0°F | Resp 16 | Ht 67.75 in | Wt 190.0 lb

## 2013-08-17 DIAGNOSIS — Z79899 Other long term (current) drug therapy: Secondary | ICD-10-CM

## 2013-08-17 DIAGNOSIS — Z1211 Encounter for screening for malignant neoplasm of colon: Secondary | ICD-10-CM | POA: Insufficient documentation

## 2013-08-17 DIAGNOSIS — I4891 Unspecified atrial fibrillation: Secondary | ICD-10-CM

## 2013-08-17 DIAGNOSIS — I10 Essential (primary) hypertension: Secondary | ICD-10-CM

## 2013-08-17 DIAGNOSIS — I480 Paroxysmal atrial fibrillation: Secondary | ICD-10-CM

## 2013-08-17 DIAGNOSIS — Z1212 Encounter for screening for malignant neoplasm of rectum: Secondary | ICD-10-CM

## 2013-08-17 DIAGNOSIS — E559 Vitamin D deficiency, unspecified: Secondary | ICD-10-CM | POA: Insufficient documentation

## 2013-08-17 DIAGNOSIS — Z7901 Long term (current) use of anticoagulants: Secondary | ICD-10-CM | POA: Insufficient documentation

## 2013-08-17 DIAGNOSIS — E785 Hyperlipidemia, unspecified: Secondary | ICD-10-CM

## 2013-08-17 DIAGNOSIS — E1029 Type 1 diabetes mellitus with other diabetic kidney complication: Secondary | ICD-10-CM

## 2013-08-17 LAB — CBC WITH DIFFERENTIAL/PLATELET
BASOS ABS: 0 10*3/uL (ref 0.0–0.1)
BASOS PCT: 1 % (ref 0–1)
Eosinophils Absolute: 0.1 10*3/uL (ref 0.0–0.7)
Eosinophils Relative: 2 % (ref 0–5)
HEMATOCRIT: 41.8 % (ref 39.0–52.0)
Hemoglobin: 14.5 g/dL (ref 13.0–17.0)
Lymphocytes Relative: 24 % (ref 12–46)
Lymphs Abs: 1 10*3/uL (ref 0.7–4.0)
MCH: 31 pg (ref 26.0–34.0)
MCHC: 34.7 g/dL (ref 30.0–36.0)
MCV: 89.5 fL (ref 78.0–100.0)
MONO ABS: 0.4 10*3/uL (ref 0.1–1.0)
Monocytes Relative: 9 % (ref 3–12)
NEUTROS ABS: 2.8 10*3/uL (ref 1.7–7.7)
Neutrophils Relative %: 64 % (ref 43–77)
PLATELETS: 129 10*3/uL — AB (ref 150–400)
RBC: 4.67 MIL/uL (ref 4.22–5.81)
RDW: 13 % (ref 11.5–15.5)
WBC: 4.3 10*3/uL (ref 4.0–10.5)

## 2013-08-17 MED ORDER — INSULIN NPH (HUMAN) (ISOPHANE) 100 UNIT/ML ~~LOC~~ SUSP: 50.0000 [IU] | Freq: Two times a day (BID) | SUBCUTANEOUS | Status: DC

## 2013-08-17 NOTE — Patient Instructions (Signed)

## 2013-08-17 NOTE — Progress Notes (Signed)
Patient ID: Robert Arnold, male   DOB: Oct 28, 1945, 68 y.o.   MRN: [id number]    This very nice 68 y.o. MWM presents for 3 month follow up with Hypertension, Hyperlipidemia, Pre-Diabetes and Vitamin D Deficiency. 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 BP has been controlled at home. Today's BP: 140/78 mmHg. Subsequent to his Renal Transplant , patient developed ACS and then underwent a CABG. Patient has been on Coumadin since 2013 for Afib. Patient denies any cardiac type chest pain, palpitations, dyspnea/orthopnea/PND, dizziness, claudication, or dependent edema.   Hyperlipidemia is controlled with diet & meds. Last lipids in Feb 2015 were at goal as below. Patient denies myalgias or other med SE's.  Lab Results  Component Value Date   CHOL 164 05/17/2013   HDL 57 05/17/2013   LDLCALC 76 05/17/2013   TRIG 153* 05/17/2013   CHOLHDL 2.9 05/17/2013    Also, the patient has history of T2 DM in 2003 transitioned to T1 Dm in insulin in 2012 and last A1c was  6.1% in Feb 2015. Patient denies any symptoms of reactive hypoglycemia, diabetic polys, paresthesias or visual blurring.   Further, Patient has history of Vitamin D Deficiency of 35 in 2008 and last vitamin D of 25 in Oct 2015 and he was advised to take 10,000 u/daily. Patient supplements vitamin D without any suspected side-effects.    Medication List       buPROPion 300 MG 24 hr tablet  Commonly known as:  WELLBUTRIN XL  Take 1 tablet (300 mg total) by mouth every morning.     citalopram 40 MG tablet  Commonly known as:  CELEXA  Take 20 mg by mouth daily.     finasteride 5 MG tablet  Commonly known as:  PROSCAR  Take 5 mg by mouth daily.     FREESTYLE LITE test strip  Generic drug:  glucose blood     glucose blood test strip  Commonly known as:  FREESTYLE TEST STRIPS  CHECKS BS tid-qid FOR MEDICATION ADJUSTMENT, DX- 250.01 NPI [id number]     hydrOXYzine 25 MG tablet  Commonly known  as:  ATARAX/VISTARIL  Take 1 to 2 tablets 2 to 53m times daily as needed for anxiety     insulin lispro 100 UNIT/ML injection  Commonly known as:  HUMALOG  Inject 5 Units into the skin 3 (three) times daily before meals. ONLY IF BS GREATER THAN 110     insulin NPH Human 100 UNIT/ML injection  Commonly known as:  NOVOLIN N RELION  Inject 0.5 mLs (50 Units total) into the skin 2 (two) times daily before a meal.     MYFORTIC 360 MG Tbec EC tablet  Generic drug:  mycophenolate  360 mg 2 (two) times daily.     pravastatin 40 MG tablet  Commonly known as:  PRAVACHOL  Take 1 tablet (40 mg total) by mouth daily.     PROGRAF 0.5 MG capsule  Generic drug:  tacrolimus  2 am and 2 pm     sulfamethoxazole-trimethoprim 400-80 MG per tablet  Commonly known as:  BACTRIM,SEPTRA  Take 1 tablet by mouth every Monday, Wednesday, and Friday.     TRAVATAN Z 0.004 % Soln ophthalmic solution  Generic drug:  Travoprost (BAK Free)  Place 1 drop into both eyes at bedtime.     VITAMIN D PO  Take 1,000 Int'l Units by mouth daily.     warfarin 5  MG tablet  Commonly known as:  COUMADIN  TAKE 1 OR 2 TABLETS BY MOUTH DAILY OR AS DIRECTED         Allergies  Allergen Reactions  . Crestor [Rosuvastatin]     Elevated LFT's  . Losartan     PMHx:   Past Medical History  Diagnosis Date  . Diabetes mellitus type 2, controlled   . Hyperlipidemia   . BPH (benign prostatic hyperplasia)   . Gout   . CAD (coronary artery disease)     s/p post-op NSTEMI. treated with stent to LAD. at [facility]  . PAF (paroxysmal atrial fibrillation)     s/p DC-CV in 6/13. Off coumadin due to ureteral bleed  . OSA (obstructive sleep apnea)   . 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 HTN (hypertension)   . MI (myocardial infarction) 09/26/2011  . 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 Eyes: Denies redness, blurred vision, diplopia, discharge, itchy, watery eyes.  ENT: Denies discharge, congestion, post nasal drip, epistaxis, sore throat, earache, hearing loss, dental pain, tinnitus, vertigo, sinus pain, snoring.  CV: Denies chest pain, palpitations, irregular heartbeat, syncope, dyspnea, diaphoresis, orthopnea, PND, claudication or edema. Respiratory: denies cough, dyspnea, DOE, pleurisy, hoarseness, laryngitis, wheezing.  Gastrointestinal: Denies dysphagia, odynophagia, heartburn, reflux, water brash, abdominal pain or cramps, nausea, vomiting, bloating, diarrhea, constipation, hematemesis, melena, hematochezia  or hemorrhoids. Genitourinary: Denies dysuria, frequency, urgency, nocturia, hesitancy, discharge, hematuria or flank pain. Musculoskeletal: Denies arthralgias, myalgias, stiffness, jt. swelling, pain, limping or strain/sprain.  Skin: Denies pruritus, rash, hives, warts, acne, eczema or change in skin lesion(s). Neuro: No weakness, tremor, incoordination, spasms, paresthesia or pain. Psychiatric: Denies confusion, memory loss or sensory loss. Endo: Denies change in weight, skin or hair change.  Heme/Lymph: No excessive bleeding, bruising or enlarged lymph nodes.   Exam:  BP 140/78  Pulse 70  Temp(Src) 98 F (36.7 C) (Temporal)  Resp 16  Ht 5' 7.75" (1.721 m)  Wt 190 lb (86.183 kg)  BMI 29.10 kg/m2  Appears well nourished - in no distress. Eyes: PERRLA, EOMs, conjunctiva no swelling or erythema. Sinuses: No frontal/maxillary tenderness ENT/Mouth: EAC's clear, TM's nl w/o erythema, bulging. Nares clear w/o erythema, swelling, exudates. Oropharynx clear without erythema or exudates. Oral hygiene is good. Tongue normal, non obstructing. Hearing intact.  Neck: Supple. Thyroid nl. Car 2+/2+ without bruits, nodes or JVD. Chest:  Respirations nl with BS clear & equal w/o rales, rhonchi, wheezing or stridor.  Cor: Heart sounds normal w/ regular rate and rhythm with 2/4 sys murmur and no sig. gallops, clicks, or rubs. Peripheral pulses normal and equal  without edema.  Abdomen: Soft & bowel sounds normal. Non-tender w/o guarding, rebound, hernias, masses, or organomegaly.  Lymphatics: Unremarkable.  Musculoskeletal: Full ROM all peripheral extremities, joint stability, 5/5 strength, and normal gait.  Skin: Warm, dry without exposed rashes, lesions or ecchymosis apparent.  Neuro: Cranial nerves intact, reflexes equal bilaterally. Sensory-motor testing grossly intact. Tendon reflexes grossly intact.  Pysch: Alert & oriented x 3. Insight and judgement nl & appropriate. No ideations.  Assessment and Plan:  1. Hypertension - Continue monitor blood pressure at home. Continue diet/meds same.  2. Hyperlipidemia - Continue diet/meds, exercise,& lifestyle modifications.  Continue monitor periodic cholesterol/liver & renal functions   3. T1 IDDM w/Stage 2 CKD  - continue recommend prudent low glycemic diet, weight control, regular exercise, diabetic monitoring and periodic eye exams. Discussed & given Rx to switch Lantus to bid Novolin/Relion 70/30 mix beginning at 15 units at Bkfst & 10 units at supper.  4. Vitamin D Deficiency - Continue supplementation.  Recommended regular exercise, BP monitoring, weight control, and discussed med and SE's. Recommended labs to assess and monitor clinical status. Further disposition pending results of labs.

## 2013-08-18 ENCOUNTER — Encounter (HOSPITAL_COMMUNITY): Payer: Self-pay

## 2013-08-18 ENCOUNTER — Ambulatory Visit (HOSPITAL_COMMUNITY)
Admission: RE | Admit: 2013-08-18 | Discharge: 2013-08-18 | Disposition: A | Discharge status: Home / Self Care | Payer: Medicare Other | Source: Ambulatory Visit | Attending: Internal Medicine | Admitting: Internal Medicine

## 2013-08-18 VITALS — BP 128/50 | HR 73 | Wt 190.1 lb

## 2013-08-18 DIAGNOSIS — I10 Essential (primary) hypertension: Secondary | ICD-10-CM | POA: Insufficient documentation

## 2013-08-18 DIAGNOSIS — I2589 Other forms of chronic ischemic heart disease: Secondary | ICD-10-CM | POA: Insufficient documentation

## 2013-08-18 DIAGNOSIS — I480 Paroxysmal atrial fibrillation: Secondary | ICD-10-CM

## 2013-08-18 DIAGNOSIS — Z951 Presence of aortocoronary bypass graft: Secondary | ICD-10-CM | POA: Insufficient documentation

## 2013-08-18 DIAGNOSIS — I5022 Chronic systolic (congestive) heart failure: Secondary | ICD-10-CM

## 2013-08-18 DIAGNOSIS — E785 Hyperlipidemia, unspecified: Secondary | ICD-10-CM | POA: Insufficient documentation

## 2013-08-18 DIAGNOSIS — I4891 Unspecified atrial fibrillation: Secondary | ICD-10-CM | POA: Insufficient documentation

## 2013-08-18 DIAGNOSIS — I2510 Atherosclerotic heart disease of native coronary artery without angina pectoris: Secondary | ICD-10-CM | POA: Insufficient documentation

## 2013-08-18 DIAGNOSIS — N189 Chronic kidney disease, unspecified: Secondary | ICD-10-CM

## 2013-08-18 LAB — MAGNESIUM: Magnesium: 1.7 mg/dL (ref 1.5–2.5)

## 2013-08-18 LAB — LIPID PANEL
CHOLESTEROL: 176 mg/dL (ref 0–200)
HDL: 54 mg/dL (ref >39)
LDL Cholesterol: 67 mg/dL (ref 0–99)
TRIGLYCERIDES: 274 mg/dL — AB (ref <150)
Total CHOL/HDL Ratio: 3.3 Ratio
VLDL: 55 mg/dL — ABNORMAL HIGH (ref 0–40)

## 2013-08-18 LAB — BASIC METABOLIC PANEL WITH GFR
BUN: 20 mg/dL (ref 6–23)
CO2: 26 mEq/L (ref 19–32)
Calcium: 9.8 mg/dL (ref 8.4–10.5)
Chloride: 103 mEq/L (ref 96–112)
Creat: 1.14 mg/dL (ref 0.50–1.35)
GFR, EST AFRICAN AMERICAN: 78 mL/min
GFR, EST NON AFRICAN AMERICAN: 67 mL/min
Glucose, Bld: 145 mg/dL — ABNORMAL HIGH (ref 70–99)
POTASSIUM: 4.5 meq/L (ref 3.5–5.3)
Sodium: 140 mEq/L (ref 135–145)

## 2013-08-18 LAB — TSH: TSH: 1.421 u[IU]/mL (ref 0.350–4.500)

## 2013-08-18 LAB — HEMOGLOBIN A1C
Hgb A1c MFr Bld: 6.2 % — ABNORMAL HIGH (ref <5.7)
MEAN PLASMA GLUCOSE: 131 mg/dL — AB (ref <117)

## 2013-08-18 LAB — VITAMIN D 25 HYDROXY (VIT D DEFICIENCY, FRACTURES): VIT D 25 HYDROXY: 31 ng/mL (ref 30–89)

## 2013-08-18 LAB — HEPATIC FUNCTION PANEL
ALBUMIN: 4.3 g/dL (ref 3.5–5.2)
ALT: 64 U/L — ABNORMAL HIGH (ref 0–53)
AST: 45 U/L — ABNORMAL HIGH (ref 0–37)
Alkaline Phosphatase: 125 U/L — ABNORMAL HIGH (ref 39–117)
BILIRUBIN TOTAL: 1.2 mg/dL (ref 0.2–1.2)
Bilirubin, Direct: 0.2 mg/dL (ref 0.0–0.3)
Indirect Bilirubin: 1 mg/dL (ref 0.2–1.2)
Total Protein: 6.5 g/dL (ref 6.0–8.3)

## 2013-08-18 LAB — PROTIME-INR
INR: 3.26 — AB (ref <1.50)
PROTHROMBIN TIME: 32.3 s — AB (ref 11.6–15.2)

## 2013-08-18 LAB — INSULIN, FASTING: INSULIN FASTING, SERUM: 59 u[IU]/mL — AB (ref 3–28)

## 2013-08-18 NOTE — Patient Instructions (Signed)
We will contact you in 6 months to schedule your next appointment.  

## 2013-08-19 NOTE — Progress Notes (Signed)
Patient ID: Robert Arnold, male   DOB: 27-Jan-1946, 68 y.o.   MRN: [id number] PCP: Dr. Melford Aase Nephrologist: Dr. 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  He underwent living-related kidney transplant at [facility] on 09/25/2011. Post day #1 had a NSTEMI. Cath revealed multivessel disease (details not available) . Underwent stenting of Xience Expedition LAD (DES). Developed AF and required DC-CV. Treated with coumadin for about a week and then stopped due to ureteral bleeding which required a stent. Echo showed EF 20% with global HK. Transferred to rehab for 11 days. D/c'd October 17 2011.    Echo 01/02/2012: EF 50-55%, with question of very mild mid to distal inferior wall hypokinesis. Mild AS.   01/14/12 LHC 1. Severe 3v calcific CAD with patent LAD stent 2. Mild AS    01/28/12 CABG x 4 at [place] op A fib treated with amio.  02/24/13 ECHO: EF 50% with inferior/posterior HK, mild LVH, no AS  Follow up: Doing well overall.  No exertional dyspnea or chest pain.  No tachypalpitations.  He is in NSR today.    Labs (5/15): K 4.5, creatinine 1.14, LDL 67, HDL 54  ROS: All systems negative except as listed in HPI, PMH and Problem List.  Past Medical History  Diagnosis Date  . Diabetes mellitus type 2, controlled   . Hyperlipidemia   . BPH (benign prostatic hyperplasia)   . Gout   . CAD (coronary artery disease)     s/p post-op NSTEMI. treated with stent to LAD. at [facility]  . PAF (paroxysmal atrial fibrillation)     s/p DC-CV in 6/13. Off coumadin due to ureteral bleed  . OSA (obstructive sleep apnea)   . 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 HTN (hypertension)   . MI (myocardial infarction) 09/26/2011  . Chronic kidney disease     due IgA nephropathy - s/p kidnet  transplant 09/25/11    Current Outpatient Prescriptions  Medication Sig Dispense Refill  . buPROPion (WELLBUTRIN XL) 300 MG 24 hr tablet Take 1 tablet (300 mg total) by mouth every morning.  30 tablet  2  . Cholecalciferol (VITAMIN D PO) Take 1,000 Int'l Units by mouth daily.      . citalopram (CELEXA) 40 MG tablet Take 20 mg by mouth daily.      . finasteride (PROSCAR) 5 MG tablet Take 5 mg by mouth daily.       Marland Kitchen FREESTYLE LITE test strip       . glucose blood (FREESTYLE TEST STRIPS) test strip CHECKS BS tid-qid FOR MEDICATION ADJUSTMENT, DX- 250.01 NPI [id number]  450 each  PRN  . insulin glargine (LANTUS) 100 UNIT/ML injection Inject 20 Units into the skin 2 (two) times daily.      . insulin lispro (HUMALOG) 100 UNIT/ML injection Inject 5 Units into the skin 3 (three) times daily before meals. ONLY IF BS GREATER THAN 110      . Magnesium 500 MG CAPS Take 1,000 mg by mouth daily.      Marland Kitchen MYFORTIC 360 MG TBEC 360 mg 2 (two) times daily.       . pravastatin (PRAVACHOL) 40 MG tablet Take 1 tablet (40 mg total) by mouth daily.  90 tablet  0  . PROGRAF 0.5 MG capsule 2  am and 2 pm      . sulfamethoxazole-trimethoprim (BACTRIM,SEPTRA) 400-80 MG per tablet Take 1 tablet by mouth every Monday, Wednesday, and Friday.       . TRAVATAN Z 0.004 % SOLN ophthalmic solution Place 1 drop into both eyes at bedtime.       Marland Kitchen warfarin (COUMADIN) 5 MG tablet TAKE 1 OR 2 TABLETS BY MOUTH DAILY OR AS DIRECTED  180 tablet  PRN   No current facility-administered medications for this encounter.    Filed Vitals:   08/18/13 1218  BP: 128/50  Pulse: 73  Weight: 190 lb 1.9 oz (86.238 kg)  SpO2: 98%    PHYSICAL EXAM: General:  Well appearing. No resp difficulty HEENT: normal Neck: supple. JVP flat. Carotids 2+ bilaterally; + bilat radiated bruits. No lymphadenopathy or thryomegaly appreciated. Cor: PMI normal. Regular rate & rhythm. 2/6 RSB SM Lungs: clear Abdomen: soft, nontender, nondistended. No  hepatosplenomegaly. No bruits or masses. Good bowel sounds. Extremities: no cyanosis, clubbing, rash, edema Neuro: alert & orientedx3, cranial nerves grossly intact. Moves all 4 extremities w/o difficulty. Affect pleasant.  ASSESSMENT & PLAN:  1) CAD: s/p CABG x4 2013.  No chest pain or dyspnea.  Continue pravastatin and warfarin.  He is not on ASA with stable coronary disease and use of warfarin.   2) HTN: Controlled. Not currently on any medications.   3) Hyperlipidemia: LDL at goal (< 70) on pravastatin.   4) Afib: Paroxysmal, continue warfarin. We talked about switching to Eliquis or Xarelto.  He will talk to his transplant nephrologist about this to see if there would be any problems with his anti-rejection meds.   5) Ischemic cardiomyopathy: EF improved to 50% on last echo.  He is euvolemic with NYHA class I-II symptoms.   Larey Dresser 08/19/2013

## 2013-08-20 ENCOUNTER — Encounter: Payer: Self-pay | Admitting: Physician Assistant

## 2013-08-21 MED ORDER — PRAVASTATIN SODIUM 40 MG PO TABS: 40.0000 mg | ORAL_TABLET | Freq: Every day | ORAL | Status: DC

## 2013-09-04 ENCOUNTER — Encounter: Payer: Self-pay | Admitting: Internal Medicine

## 2013-09-04 MED ORDER — FINASTERIDE 5 MG PO TABS: 5.0000 mg | ORAL_TABLET | Freq: Every day | ORAL | Status: DC

## 2013-09-06 ENCOUNTER — Encounter: Payer: Self-pay | Admitting: Emergency Medicine

## 2013-09-06 ENCOUNTER — Ambulatory Visit (INDEPENDENT_AMBULATORY_CARE_PROVIDER_SITE_OTHER): Payer: Medicare Other | Admitting: Emergency Medicine

## 2013-09-06 VITALS — BP 110/60 | HR 76 | Temp 97.8°F | Resp 16 | Ht 67.75 in | Wt 191.0 lb

## 2013-09-06 DIAGNOSIS — R350 Frequency of micturition: Secondary | ICD-10-CM

## 2013-09-06 DIAGNOSIS — R5383 Other fatigue: Secondary | ICD-10-CM

## 2013-09-06 DIAGNOSIS — IMO0001 Reserved for inherently not codable concepts without codable children: Secondary | ICD-10-CM

## 2013-09-06 DIAGNOSIS — R5381 Other malaise: Secondary | ICD-10-CM

## 2013-09-06 DIAGNOSIS — R197 Diarrhea, unspecified: Secondary | ICD-10-CM

## 2013-09-06 DIAGNOSIS — Z7901 Long term (current) use of anticoagulants: Secondary | ICD-10-CM

## 2013-09-06 DIAGNOSIS — Z79899 Other long term (current) drug therapy: Secondary | ICD-10-CM

## 2013-09-06 NOTE — Progress Notes (Signed)
Subjective:    Patient ID: Robert Arnold, male    DOB: 06-Nov-1945, 68 y.o.   MRN: [id number]  HPI Comments: 68 yo WM for f/u coumadin for prophylaxis. He denies unusual bleeding. He did have dose change at last ov.   He notes diarrhea x several weeks twice daily. He denies any new exposures and notes he drinks plenty of fluids. He is eating healthy for the most part. He notes he had episode of feeling bad last night that woke him up. He notes he feels fine today. He did have evaluation at [facility] in 07/2013 which was primarily negative. He did not start any new RX but is on chronic ABX for kidney transplant.   He notes mild depression on/ off but overall feels well controlled on current RX.  [facility] SCANS CT ABDOMEN/PELVIS WITHOUT CONTRAST - Final result (07/19/2013 6:47 PM EDT) CT ABDOMEN/PELVIS WITHOUT CONTRAST - Final result (07/19/2013 6:47 PM EDT)  Impressions  IMPRESSION:    1.  No CT evidence of acute abdominal or pelvic abnormality. No evidence of   acute retroperitoneal hemorrhage.      2.  Atrophic native kidneys with multiple cysts in bilateral renal calculi. No   hydronephrosis. Right lower quadrant renal transplant with no perinephric   fluid collections, hydronephrosis or nephrolithiasis.      3.  Soft tissue thickening in the right anterolateral abdominal wall, likely   surgical scar. No focal fluid collections in this region identified.      4.  Ancillary findings as discussed above.      Electronically Signed By: Paulene Floor, MD on 07/19/2013 6:56 PM     CT ABDOMEN/PELVIS WITHOUT CONTRAST - Final result (07/19/2013 6:47 PM EDT)  Narrative  EXAM: CT ABDOMEN/PELVIS WITHOUT CONTRAST, 07/19/2013 18:47 PM      COMPARISON: No prior studies available for comparison.      CLINICAL INDICATIONS:  evaluate for rp bleed, hematoma, seroma. oral contrast   if neccessary for these                TECHNIQUE: Helical axial images of the abdomen and pelvis were performed from    the top of the kidneys to the ischial tuberosities without the administration   of oral and IV contrast. Coronal 2 mm reconstructions were also made.         FINDINGS:       Lung Bases:     Lung bases are clear. Coronary artery calcifications are present.      ABDOMEN      Evaluation of the solid organs is limited without intravenous contrast.      Liver:   Liver is normal in size and CT density. No focal lesions. A few granulomatous   calcifications are present.      Biliary/Gallbladder:    Cholelithiasis. No wall thickening or surrounding inflammatory changes. The   biliary tree is nondilated.      Spleen:   Spleen is normal in size and CT density. Granulomatous calcifications of the   spleen.      Pancreas:    Pancreas is normal. There is no evidence of pancreatic mass or peripancreatic   fluid.      Kidneys:    Native kidneys are atrophic. Bilateral renal cysts with several   low-attenuation lesions in both kidneys which are too small to characterize.   Small bilateral nonobstructing renal calculi. There is a 6 mm calculus in the   left renal pelvis. No hydronephrosis. Right  lower quadrant renal transplant.   No transplant hydronephrosis or nephrolithiasis. Minimal stranding in the   perinephric fat. No perinephric fluid collections.      Adrenals:    Adrenal glands are unremarkable.      Retroperitoneal/Lymph Nodes/Vasculature:    No retroperitoneal hemorrhage/hematoma. No retroperitoneal lymphadenopathy.   Extensive atherosclerotic vascular disease.      Gastrointestinal/Mesentery:    No bowel obstruction or inflammatory changes. Appendix is normal.         PELVIS      Bladder:    The bladder is normal.      Genital:     Prostate and seminal vesicles are unremarkable.      Scarring in the right anterolateral pelvic wall overlying the renal   transplant.      Bony Structures:     Visualized bony structures are consistent with the patient's age.            US RENAL TRANSPLANT SCAN - Final result (07/19/2013 3:04 PM EDT) US RENAL TRANSPLANT SCAN - Final result (07/19/2013 3:04 PM EDT)  Impressions  IMPRESSION:      1.  Normal echogenicity of the transplant kidney without hydronephrosis. No   perinephric fluid collections identified.       2.  Resistivity indices within normal limits.         I personally viewed and interpreted these images and I have reviewed and   approved this report.      Electronically Signed By: Paulene Floor, MD on 07/19/2013 3:53 PM     US RENAL TRANSPLANT SCAN - Final result (07/19/2013 3:04 PM EDT)  Narrative  EXAM: US RENAL TRANSPLANT SCAN, 07/19/2013 15:04 PM      CLINICAL INDICATIONS:  h/o R sided LRRT in 2013, here with RLQ   fullness/pressure. R/o hematoma, seroma       COMPARISON: No prior studies available for comparison.        TECHNIQUE: Real-time grey scale ultrasound images of the renal transplant in   the right lower quadrant were obtained in longitudinal and transverse   orientations utilizing a curved array transducer.  Color and duplex doppler   imaging was used to evaluate vascular flow.      FINDINGS:      Transplant Kidney: The transplanted kidney is identified in the right lower   quadrant. It measures 12.2 cm in length.        Cortex: The renal cortex has a smooth contour, and echogenicity appears   normal. Minimal calyceal prominence, though no evidence of hydronephrosis.  No   perinephric fluid collections or obvious renal calculi or masses are evident.      Doppler: Duplex imaging demonstrates normal arterial waveforms.  The resistive   indices measure from 0.62 to 0.72, which is normal.      Bladder: Limited imaging of the urinary bladder demonstrates no abnormality.       Abdomen: No ascites in the visualized images.            Medication List       This list is accurate as of: 09/06/13  4:47 PM.  Always use your most recent med list.                buPROPion 300 MG 24 hr tablet  Commonly known as:  WELLBUTRIN XL  Take 1 tablet (300 mg total) by mouth every morning.     citalopram 40 MG tablet  Commonly known as:  CELEXA  Take 20  mg by mouth daily.     finasteride 5 MG tablet  Commonly known as:  PROSCAR  Take 1 tablet (5 mg total) by mouth daily.     FREESTYLE LITE test strip  Generic drug:  glucose blood     glucose blood test strip  Commonly known as:  FREESTYLE TEST STRIPS  CHECKS BS tid-qid FOR MEDICATION ADJUSTMENT, DX- 250.01 NPI [id number]     insulin glargine 100 UNIT/ML injection  Commonly known as:  LANTUS  Inject 20 Units into the skin 2 (two) times daily.     insulin lispro 100 UNIT/ML injection  Commonly known as:  HUMALOG  Inject 5 Units into the skin 3 (three) times daily before meals. ONLY IF BS GREATER THAN 110     Magnesium 500 MG Caps  Take 1,000 mg by mouth daily.     MYFORTIC 360 MG Tbec EC tablet  Generic drug:  mycophenolate  360 mg 2 (two) times daily.     pravastatin 40 MG tablet  Commonly known as:  PRAVACHOL  Take 1 tablet (40 mg total) by mouth daily.     sulfamethoxazole-trimethoprim 400-80 MG per tablet  Commonly known as:  BACTRIM,SEPTRA  Take 1 tablet by mouth every Monday, Wednesday, and Friday.     tacrolimus 1 MG capsule  Commonly known as:  PROGRAF  Take 2 mg by mouth 2 (two) times daily.     TRAVATAN Z 0.004 % Soln ophthalmic solution  Generic drug:  Travoprost (BAK Free)  Place 1 drop into both eyes at bedtime.     VITAMIN D PO  Take 1,000 Int'l Units by mouth daily.     warfarin 5 MG tablet  Commonly known as:  COUMADIN  Takes 5 mg M,W,F and 2.5 times 4 days       Allergies  Allergen Reactions  . Crestor [Rosuvastatin]     Elevated LFT's  . Losartan    Past Medical History  Diagnosis Date  . Diabetes mellitus type 2, controlled   . Hyperlipidemia   . BPH (benign prostatic hyperplasia)   . Gout   . CAD (coronary artery disease)     s/p post-op NSTEMI.  treated with stent to LAD. at [facility]  . PAF (paroxysmal atrial fibrillation)     s/p DC-CV in 6/13. Off coumadin due to ureteral bleed  . OSA (obstructive sleep apnea)   . 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 HTN (hypertension)   . MI (myocardial infarction) 09/26/2011  . 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  Gastrointestinal: Positive for diarrhea.  Genitourinary: Positive for frequency.  All other systems reviewed and are negative.  BP 110/60  Pulse 76  Temp(Src) 97.8 F (36.6 C) (Temporal)  Resp 16  Ht 5' 7.75" (1.721 m)  Wt 191 lb (86.637 kg)  BMI 29.25 kg/m2     Objective:   Physical Exam  Nursing note and vitals reviewed. Constitutional: He is oriented to person, place, and time. He appears well-developed and well-nourished.  HENT:  Head: Normocephalic and atraumatic.  Right Ear: External ear normal.  Left Ear: External ear normal.  Nose: Nose normal.  Eyes: Conjunctivae and EOM are normal.  Neck: Normal range of motion. Neck supple. No JVD present. No thyromegaly present.  Cardiovascular: Normal rate, regular rhythm, normal heart sounds and intact distal pulses.  Exam  reveals no gallop and no friction rub.   No murmur heard. NO Obvious Aortic aneurysm auscultated  Pulmonary/Chest: Effort normal and breath sounds normal.  Abdominal: Soft. He exhibits no distension and no mass. There is no tenderness. There is no rebound and no guarding.  Mildly increased BS  Musculoskeletal: Normal range of motion. He exhibits no edema and no tenderness.  Lymphadenopathy:    He has no cervical adenopathy.  Neurological: He is alert and oriented to person, place, and time. He has normal reflexes. No cranial nerve deficit. Coordination normal.  Skin: Skin is warm and dry.  Ecchymosis bilateral UE no change  Psychiatric: He has a normal mood  and affect. His behavior is normal. Judgment and thought content normal.          Assessment & Plan:  1. Coumadin prophylaxis- Check labs with dose change  2. ? Depression increase- Check labs if neg consider rx change. INCREASE Folic acid to 3 times a week, w/c if SX increase or ER.   3. Diarrhea/ urine frequency- check labs, if symptoms increase ER. May need stool culture if all normal

## 2013-09-06 NOTE — Patient Instructions (Signed)
Diarrhea Diarrhea is watery poop (stool). It can make you feel weak, tired, thirsty, or give you a dry mouth (signs of dehydration). Watery poop is a sign of another problem, most often an infection. It often lasts 2 3 days. It can last longer if it is a sign of something serious. Take care of yourself as told by your doctor. HOME CARE   Drink 1 cup (8 ounces) of fluid each time you have watery poop.  Do not drink the following fluids:  Those that contain simple sugars (fructose, glucose, galactose, lactose, sucrose, maltose).  Sports drinks.  Fruit juices.  Whole milk products.  Sodas.  Drinks with caffeine (coffee, tea, soda) or alcohol.  Oral rehydration solution may be used if the doctor says it is okay. You may make your own solution. Follow this recipe:    teaspoon table salt.   teaspoon baking soda.   teaspoon salt substitute containing potassium chloride.  1 tablespoons sugar.  1 liter (34 ounces) of water.  Avoid the following foods:  High fiber foods, such as raw fruits and vegetables.  Nuts, seeds, and whole grain breads and cereals.   Those that are sweetened with sugar alcohols (xylitol, sorbitol, mannitol).  Try eating the following foods:  Starchy foods, such as rice, toast, pasta, low-sugar cereal, oatmeal, baked potatoes, crackers, and bagels.  Bananas.  Applesauce.  Eat probiotic-rich foods, such as yogurt and milk products that are fermented.  Wash your hands well after each time you have watery poop.  Only take medicine as told by your doctor.  Take a warm bath to help lessen burning or pain from having watery poop. GET HELP RIGHT AWAY IF:   You cannot drink fluids without throwing up (vomiting).  You keep throwing up.  You have blood in your poop, or your poop looks black and tarry.  You do not pee (urinate) in 6 8 hours, or there is only a small amount of very dark pee.  You have belly (abdominal) pain that gets worse or stays in  the same spot (localizes).  You are weak, dizzy, confused, or lightheaded.  You have a very bad headache.  Your watery poop gets worse or does not get better.  You have a fever or lasting symptoms for more than 2 3 days.  You have a fever and your symptoms suddenly get worse. MAKE SURE YOU:   Understand these instructions.  Will watch your condition.  Will get help right away if you are not doing well or get worse. Document Released: 09/11/2007 Document Revised: 12/18/2011 Document Reviewed: 12/01/2011 Exit[employer] Patient Information 2014 [employer].  

## 2013-09-07 LAB — CBC WITH DIFFERENTIAL/PLATELET
BASOS ABS: 0.1 10*3/uL (ref 0.0–0.1)
Basophils Relative: 1 % (ref 0–1)
Eosinophils Absolute: 0.1 10*3/uL (ref 0.0–0.7)
Eosinophils Relative: 1 % (ref 0–5)
HCT: 42.5 % (ref 39.0–52.0)
Hemoglobin: 14.6 g/dL (ref 13.0–17.0)
Lymphocytes Relative: 26 % (ref 12–46)
Lymphs Abs: 1.5 10*3/uL (ref 0.7–4.0)
MCH: 30.5 pg (ref 26.0–34.0)
MCHC: 34.4 g/dL (ref 30.0–36.0)
MCV: 88.9 fL (ref 78.0–100.0)
Monocytes Absolute: 0.6 10*3/uL (ref 0.1–1.0)
Monocytes Relative: 10 % (ref 3–12)
NEUTROS ABS: 3.5 10*3/uL (ref 1.7–7.7)
NEUTROS PCT: 62 % (ref 43–77)
PLATELETS: 156 10*3/uL (ref 150–400)
RBC: 4.78 MIL/uL (ref 4.22–5.81)
RDW: 13.3 % (ref 11.5–15.5)
WBC: 5.7 10*3/uL (ref 4.0–10.5)

## 2013-09-07 LAB — URINALYSIS, ROUTINE W REFLEX MICROSCOPIC
Bilirubin Urine: NEGATIVE
Glucose, UA: NEGATIVE mg/dL
HGB URINE DIPSTICK: NEGATIVE
KETONES UR: NEGATIVE mg/dL
LEUKOCYTES UA: NEGATIVE
NITRITE: NEGATIVE
PROTEIN: NEGATIVE mg/dL
Specific Gravity, Urine: 1.023 (ref 1.005–1.030)
UROBILINOGEN UA: 0.2 mg/dL (ref 0.0–1.0)
pH: 5.5 (ref 5.0–8.0)

## 2013-09-07 LAB — BASIC METABOLIC PANEL WITH GFR
BUN: 22 mg/dL (ref 6–23)
CALCIUM: 10 mg/dL (ref 8.4–10.5)
CO2: 30 mEq/L (ref 19–32)
CREATININE: 1.19 mg/dL (ref 0.50–1.35)
Chloride: 101 mEq/L (ref 96–112)
GFR, Est African American: 74 mL/min
GFR, Est Non African American: 64 mL/min
GLUCOSE: 100 mg/dL — AB (ref 70–99)
Potassium: 4.3 mEq/L (ref 3.5–5.3)
Sodium: 139 mEq/L (ref 135–145)

## 2013-09-07 LAB — HEPATIC FUNCTION PANEL
ALBUMIN: 4.4 g/dL (ref 3.5–5.2)
ALT: 54 U/L — AB (ref 0–53)
AST: 45 U/L — AB (ref 0–37)
Alkaline Phosphatase: 112 U/L (ref 39–117)
Bilirubin, Direct: 0.2 mg/dL (ref 0.0–0.3)
Indirect Bilirubin: 0.9 mg/dL (ref 0.2–1.2)
Total Bilirubin: 1.1 mg/dL (ref 0.2–1.2)
Total Protein: 7 g/dL (ref 6.0–8.3)

## 2013-09-07 LAB — TACROLIMUS LEVEL: Tacrolimus Lvl: 8.8 ng/mL (ref 5.0–20.0)

## 2013-09-07 LAB — PROTIME-INR
INR: 1.02 (ref <1.50)
Prothrombin Time: 13.3 seconds (ref 11.6–15.2)

## 2013-09-07 LAB — URINE CULTURE
COLONY COUNT: NO GROWTH
Organism ID, Bacteria: NO GROWTH

## 2013-09-07 LAB — TSH: TSH: 1.51 u[IU]/mL (ref 0.350–4.500)

## 2013-09-09 ENCOUNTER — Encounter: Payer: Self-pay | Admitting: Emergency Medicine

## 2013-09-16 ENCOUNTER — Other Ambulatory Visit: Payer: Self-pay | Admitting: Emergency Medicine

## 2013-09-16 DIAGNOSIS — Z7901 Long term (current) use of anticoagulants: Secondary | ICD-10-CM

## 2013-09-20 ENCOUNTER — Ambulatory Visit (INDEPENDENT_AMBULATORY_CARE_PROVIDER_SITE_OTHER): Payer: Medicare Other

## 2013-09-20 ENCOUNTER — Ambulatory Visit: Payer: Self-pay | Admitting: Physician Assistant

## 2013-09-20 ENCOUNTER — Other Ambulatory Visit: Payer: Medicare Other

## 2013-09-20 DIAGNOSIS — Z7901 Long term (current) use of anticoagulants: Secondary | ICD-10-CM

## 2013-09-20 NOTE — Progress Notes (Signed)
Patient ID: Robert Arnold, male   DOB: Sep 22, 1945, 67 y.o.   MRN: [id number] Patient here today to recheck PT/INR

## 2013-09-21 LAB — PROTIME-INR
INR: 1.2 (ref <1.50)
Prothrombin Time: 15.1 seconds (ref 11.6–15.2)

## 2013-09-22 ENCOUNTER — Telehealth: Payer: Self-pay | Admitting: *Deleted

## 2013-09-22 ENCOUNTER — Other Ambulatory Visit: Payer: Self-pay | Admitting: Emergency Medicine

## 2013-09-22 NOTE — Telephone Encounter (Signed)
Patient aware.  States he is "not a fan of greens" and may only eat them once a week if that.  Patient will change Coumadin to 5 mg daily and recheck PT/INR in 2 weeks AD.

## 2013-09-22 NOTE — Progress Notes (Signed)
Patient aware.

## 2013-09-22 NOTE — Telephone Encounter (Signed)
I called pt to schedule his 2 wk appt for NV pt concerned about directions of going back to 5mg  QD. Pt said 2wks ago he was on 5mg  QD & said he was to thin.  Please advise pt. Please transfer pt to front to schedule his 2 wk appt

## 2013-10-06 ENCOUNTER — Other Ambulatory Visit: Payer: Self-pay | Admitting: *Deleted

## 2013-10-06 MED ORDER — INSULIN PEN NEEDLE 31G X 5 MM MISC: Status: DC

## 2013-10-12 ENCOUNTER — Ambulatory Visit (INDEPENDENT_AMBULATORY_CARE_PROVIDER_SITE_OTHER): Payer: Medicare Other | Admitting: *Deleted

## 2013-10-12 DIAGNOSIS — Z7901 Long term (current) use of anticoagulants: Secondary | ICD-10-CM

## 2013-10-12 LAB — PROTIME-INR
INR: 1.23 (ref <1.50)
Prothrombin Time: 15.5 seconds — ABNORMAL HIGH (ref 11.6–15.2)

## 2013-10-12 NOTE — Progress Notes (Signed)
Patient ID: Robert Arnold, male   DOB: Feb 10, 1946, 68 y.o.   MRN: [id number] Patient presents for recheck PT/INR.  Currently taking Coumadin 2.5 mg Tues and Thurs and 5 mg times 5 days.

## 2013-10-13 ENCOUNTER — Encounter: Payer: Self-pay | Admitting: Emergency Medicine

## 2013-10-14 ENCOUNTER — Other Ambulatory Visit: Payer: Self-pay | Admitting: Emergency Medicine

## 2013-10-14 MED ORDER — WARFARIN SODIUM 5 MG PO TABS: 5.0000 mg | ORAL_TABLET | Freq: Every day | ORAL | Status: DC

## 2013-10-19 ENCOUNTER — Encounter: Payer: Self-pay | Admitting: Emergency Medicine

## 2013-10-25 ENCOUNTER — Encounter: Payer: Self-pay | Admitting: Physician Assistant

## 2013-10-25 ENCOUNTER — Ambulatory Visit (INDEPENDENT_AMBULATORY_CARE_PROVIDER_SITE_OTHER): Payer: Medicare Other | Admitting: Physician Assistant

## 2013-10-25 VITALS — BP 122/78 | HR 64 | Temp 97.7°F | Resp 16 | Wt 194.0 lb

## 2013-10-25 DIAGNOSIS — E1029 Type 1 diabetes mellitus with other diabetic kidney complication: Secondary | ICD-10-CM

## 2013-10-25 DIAGNOSIS — E559 Vitamin D deficiency, unspecified: Secondary | ICD-10-CM

## 2013-10-25 DIAGNOSIS — Z79899 Other long term (current) drug therapy: Secondary | ICD-10-CM

## 2013-10-25 DIAGNOSIS — Z0000 Encounter for general adult medical examination without abnormal findings: Secondary | ICD-10-CM

## 2013-10-25 DIAGNOSIS — Z23 Encounter for immunization: Secondary | ICD-10-CM

## 2013-10-25 DIAGNOSIS — Z7901 Long term (current) use of anticoagulants: Secondary | ICD-10-CM

## 2013-10-25 DIAGNOSIS — E785 Hyperlipidemia, unspecified: Secondary | ICD-10-CM

## 2013-10-25 DIAGNOSIS — I10 Essential (primary) hypertension: Secondary | ICD-10-CM

## 2013-10-25 DIAGNOSIS — Z789 Other specified health status: Secondary | ICD-10-CM

## 2013-10-25 DIAGNOSIS — Z1331 Encounter for screening for depression: Secondary | ICD-10-CM

## 2013-10-25 NOTE — Progress Notes (Signed)
MEDICARE ANNUAL WELLNESS VISIT AND FOLLOW UP Assessment:   Chronic anticoagulation- check INR and will adjust medication according to labs.  Discussed if patient falls to immediately contact office or go to ER. Discussed foods that can increase or decrease Coumadin levels. Patient understands to call the office before starting a new medication. Follow up in one month.   Plan:   During the course of the visit the patient was educated and counseled about appropriate screening and preventive services including:    Pneumococcal vaccine   Influenza vaccine  Td vaccine  Screening electrocardiogram  Colorectal cancer screening  Diabetes screening  Glaucoma screening  Nutrition counseling   Screening recommendations, referrals: Vaccinations: Tdap vaccine not indicated Influenza vaccine up to date Pneumococcal vaccine get prevnar this visit then pneumovax Shingles vaccine not indicated Hep B vaccine up todate  Nutrition assessed and recommended  Colonoscopy due this year Recommended yearly ophthalmology/optometry visit for glaucoma screening and checkup Recommended yearly dental visit for hygiene and checkup Advanced directives - requested  Conditions/risks identified: BMI: Discussed weight loss, diet, and increase physical activity.  Increase physical activity: AHA recommends 150 minutes of physical activity a week.  Medications reviewed Diabetes is not at goal, ACE/ARB therapy: Yes. Urinary Incontinence is not an issue: discussed non pharmacology and pharmacology options.  Fall risk: low- discussed PT, home fall assessment, medications.    Subjective:  Robert Arnold is a 68 y.o. male who presents for Medicare Annual Wellness Visit and coumadin follow up. Date of last medicare wellness visit was is unknown.  His blood pressure has been controlled at home, today their BP is BP: 122/78 mmHg He does workout, some walking. He denies chest pain, shortness of breath,  dizziness.   Patient is on Coumadin for Primary Diagnosis: Afib Patient's last INR is  Lab Results  Component Value Date   INR 1.23 10/12/2013   INR 1.20 09/20/2013   INR 1.02 09/06/2013    Patient denies SOB, CP, dizziness, nose bleeds, easy bleeding, and blood in stool/urine. 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 Kidney function stable.    Names of Other Physician/Practitioners you currently use: 1. [facility] here for primary care 2. Dr. Nicki Reaper eye doctor, last visit 6 months ago 3. Dr. Sonia Side, dentist, last visit q 6 months Patient Care Team: [name], MD as PCP - General (Internal Medicine) Estanislado Emms, MD as Consulting Physician (Nephrology) Jolaine Artist, MD as Consulting Physician (Cardiology)  Medication Review: Current Outpatient Prescriptions on File Prior to Visit  Medication Sig Dispense Refill  . buPROPion (WELLBUTRIN XL) 300 MG 24 hr tablet Take 1 tablet (300 mg total) by mouth every morning.  30 tablet  2  . Cholecalciferol (VITAMIN D PO) Take 1,000 Int'l Units by mouth daily.      . finasteride (PROSCAR) 5 MG tablet Take 1 tablet (5 mg total) by mouth daily.  90 tablet  1  . FREESTYLE LITE test strip       . glucose blood (FREESTYLE TEST STRIPS) test strip CHECKS BS tid-qid FOR MEDICATION ADJUSTMENT, DX- 250.01 NPI [id number]  450 each  PRN  . insulin glargine (LANTUS) 100 UNIT/ML injection Inject 20 Units into the skin 2 (two) times daily.      . insulin lispro (HUMALOG)  100 UNIT/ML injection Inject 5 Units into the skin 3 (three) times daily before meals. ONLY IF BS GREATER THAN 110      . Insulin Pen Needle 31G X 5 MM MISC BD ultra fine pen needles.  Patient checks glucose 2 times daily.  DX-250.01  50 each  11  . Magnesium 500 MG CAPS Take 1,000  mg by mouth daily.      Marland Kitchen MYFORTIC 360 MG TBEC 360 mg 2 (two) times daily.       . pravastatin (PRAVACHOL) 40 MG tablet Take 1 tablet (40 mg total) by mouth daily.  90 tablet  0  . sulfamethoxazole-trimethoprim (BACTRIM,SEPTRA) 400-80 MG per tablet Take 1 tablet by mouth every Monday, Wednesday, and Friday.       . tacrolimus (PROGRAF) 1 MG capsule Take 2 mg by mouth 2 (two) times daily.      . TRAVATAN Z 0.004 % SOLN ophthalmic solution Place 1 drop into both eyes at bedtime.       Marland Kitchen warfarin (COUMADIN) 5 MG tablet Take 1 tablet (5 mg total) by mouth daily.  30 tablet  0   No current facility-administered medications on file prior to visit.    Current Problems (verified) Patient Active Problem List   Diagnosis Date Noted  . Vitamin D Deficiency 08/17/2013  . Encounter for long-term (current) use of other medications 08/17/2013  . Long term (current) use of anticoagulants 08/17/2013  . Systolic murmur [id number]  . Unstable angina 12/28/2011  . 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 Hyperlipidemia   . BPH (benign prostatic hyperplasia)   . Gout   . PAF (paroxysmal atrial fibrillation)   . OSA (obstructive sleep apnea)   . Histoplasmosis   . HTN (hypertension)   . Chronic kidney disease   . CAD (coronary artery disease)   . Chronic systolic heart failure [id number]    Screening Tests Health Maintenance  Topic Date Due  . Foot Exam  03/29/1958  . Ophthalmology Exam  03/29/1958  . Urine Microalbumin  03/29/1958  . Tetanus/tdap  03/30/1967  . Zostavax  03/29/2008  . Pneumococcal Polysaccharide Vaccine Age 22 And Over  03/29/2013  . Influenza Vaccine  11/06/2013  . Hemoglobin A1c  02/17/2014  . Colonoscopy  01/01/2016    Immunization History  Administered Date(s) Administered  . DTaP 04/08/2008  . Hepatitis B 04/08/2009  . Pneumococcal-Unspecified 04/08/2008    Preventative care: Last colonoscopy: 01/2009 due this year Dr. Deatra Ina Stress Myoview Normal 2010 ABD [place]  09/2012 normal but fatty liver Echo 01/02/2012: EF 50-55%, with question of very mild mid to distal inferior wall hypokinesis. Mild AS   Prior vaccinations: TD or Tdap: 2010  Influenza: 12/2012 Pneumococcal: 2010 will get AFTER prevnar 13 Prevnar 13: DUE Shingles/Zostavax: declines Hep B 2011  History reviewed: allergies, current medications, past family history, past medical history, past social history, past surgical history and problem list   Risk Factors: Tobacco History  Substance Use Topics  . Smoking status: Former Smoker    Quit date: 11/29/1990  . Smokeless tobacco: Never Used  . Alcohol Use: Yes     Comment: 1-2 a month   He does not smoke.  Patient is a former smoker. Are there smokers in your home (other than you)?  No  Alcohol Current alcohol use: social drinker  Caffeine Current caffeine use: coffee 1 /day  Exercise Current exercise: walking  Nutrition/Diet Current diet: in general, a "healthy" diet  Cardiac risk factors: advanced age (older than 54 for men, 68 for women), dyslipidemia, hypertension, male gender, obesity (BMI >= 30 kg/m2) and sedentary lifestyle.  Depression Screen (Note: if answer to either of the following is "Yes", a more complete depression screening is indicated)   Q1: Over the past two weeks, have you felt down, depressed or hopeless? No  Q2: Over the past two weeks, have you felt little interest or pleasure in doing things? No  Have you lost interest or pleasure in daily life? No  Do you often feel hopeless? No  Do you cry easily over simple problems? No  Activities of Daily Living In your present state of health, do you have any difficulty performing the following activities?:  Driving? No Managing money?  No Feeding yourself? No Getting from bed to chair? No Climbing a flight of stairs? No Preparing food and eating?: No Bathing or showering? No Getting dressed: No Getting to the toilet? No Using the  toilet:No Moving around from place to place: No In the past year have you fallen or had a near fall?:No   Are you sexually active?  No  Do you have more than one partner?  No  Vision Difficulties: No  Hearing Difficulties: No Do you often ask people to speak up or repeat themselves? No Do you experience ringing or noises in your ears? Yes Do you have difficulty understanding soft or whispered voices? No  Cognition  Do you feel that you have a problem with memory?No  Do you often misplace items? No  Do you feel safe at home?  Yes  Advanced directives Does patient have a [place]? Yes Does patient have a Living Will? Yes   Objective:   Blood pressure 122/78, pulse 64, temperature 97.7 F (36.5 C), resp. rate 16, weight 194 lb (87.998 kg). Body mass index is 29.71 kg/(m^2).  General appearance: alert, no distress, WD/WN, male Cognitive Testing  Alert? Yes  Normal Appearance?Yes  Oriented to person? Yes  Place? Yes   Time? Yes   Recall of three objects?  Yes  Can perform simple calculations? Yes  Displays appropriate judgment?Yes  Can read the correct time from a watch face?Yes  HEENT: normocephalic, sclerae anicteric, TMs pearly, nares patent, no discharge or erythema, pharynx normal Oral cavity: MMM, no lesions Neck: supple, no lymphadenopathy, no thyromegaly, no masses Heart: RRR, normal S1, S2,  3/6 systolic murmur Lungs: CTA bilaterally, no wheezes, rhonchi, or rales Abdomen: +bs, soft, non tender, non distended, no masses, no hepatomegaly, no splenomegaly Musculoskeletal: nontender, no swelling, no obvious deformity Extremities: no edema, no cyanosis, no clubbing. Left arm graft distal Pulses: 2+ symmetric, upper and lower extremities, normal cap refill Neurological: alert, oriented x 3, CN2-12 intact, strength normal upper extremities and lower extremities, sensation normal throughout, DTRs 2+ throughout, no cerebellar signs, gait  normal Psychiatric: normal affect, behavior normal, pleasant   Medicare Attestation I have personally reviewed: The patient's medical and social history Their use of alcohol, tobacco or illicit drugs Their current medications and supplements The patient's functional ability including ADLs,fall risks, home safety risks, cognitive, and hearing and visual impairment Diet and physical activities Evidence for depression or mood disorders  The patient's weight, height, BMI, and visual acuity have been recorded in the chart.  I have made referrals, counseling, and provided education to the patient based on review of the above and I have provided the patient with a written personalized care plan for preventive services.  Vicie Mutters, PA-C   10/25/2013

## 2013-10-26 LAB — PROTIME-INR
INR: 1.21 (ref <1.50)
PROTHROMBIN TIME: 15.3 s — AB (ref 11.6–15.2)

## 2013-11-08 ENCOUNTER — Encounter: Payer: Self-pay | Admitting: Internal Medicine

## 2013-11-20 ENCOUNTER — Other Ambulatory Visit: Payer: Self-pay | Admitting: Physician Assistant

## 2013-11-20 ENCOUNTER — Other Ambulatory Visit: Payer: Self-pay | Admitting: Internal Medicine

## 2013-11-26 ENCOUNTER — Ambulatory Visit (INDEPENDENT_AMBULATORY_CARE_PROVIDER_SITE_OTHER): Payer: Medicare Other | Admitting: Internal Medicine

## 2013-11-26 ENCOUNTER — Encounter: Payer: Self-pay | Admitting: Internal Medicine

## 2013-11-26 VITALS — BP 128/74 | HR 64 | Temp 97.5°F | Resp 16 | Ht 67.5 in | Wt 196.0 lb

## 2013-11-26 DIAGNOSIS — Z79899 Other long term (current) drug therapy: Secondary | ICD-10-CM

## 2013-11-26 DIAGNOSIS — Z1331 Encounter for screening for depression: Secondary | ICD-10-CM

## 2013-11-26 DIAGNOSIS — E559 Vitamin D deficiency, unspecified: Secondary | ICD-10-CM

## 2013-11-26 DIAGNOSIS — Z940 Kidney transplant status: Secondary | ICD-10-CM | POA: Insufficient documentation

## 2013-11-26 DIAGNOSIS — I2510 Atherosclerotic heart disease of native coronary artery without angina pectoris: Secondary | ICD-10-CM

## 2013-11-26 DIAGNOSIS — Z1212 Encounter for screening for malignant neoplasm of rectum: Secondary | ICD-10-CM

## 2013-11-26 DIAGNOSIS — E785 Hyperlipidemia, unspecified: Secondary | ICD-10-CM

## 2013-11-26 DIAGNOSIS — I10 Essential (primary) hypertension: Secondary | ICD-10-CM

## 2013-11-26 DIAGNOSIS — Z789 Other specified health status: Secondary | ICD-10-CM

## 2013-11-26 DIAGNOSIS — E1029 Type 1 diabetes mellitus with other diabetic kidney complication: Secondary | ICD-10-CM

## 2013-11-26 DIAGNOSIS — I214 Non-ST elevation (NSTEMI) myocardial infarction: Secondary | ICD-10-CM

## 2013-11-26 DIAGNOSIS — Z7901 Long term (current) use of anticoagulants: Secondary | ICD-10-CM

## 2013-11-26 DIAGNOSIS — Z125 Encounter for screening for malignant neoplasm of prostate: Secondary | ICD-10-CM

## 2013-11-26 HISTORY — DX: Non-ST elevation (NSTEMI) myocardial infarction: I21.4

## 2013-11-26 LAB — PROTIME-INR
INR: 1.95 — ABNORMAL HIGH (ref <1.50)
Prothrombin Time: 22.2 seconds — ABNORMAL HIGH (ref 11.6–15.2)

## 2013-11-26 NOTE — Progress Notes (Signed)
Patient ID: Robert Arnold, male   DOB: 1945-07-25, 68 y.o.   MRN: [id number]   Annual Screening Comprehensive Examination  This very nice 68 y.o.MWM presents for complete physical.  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 He has done well to present.    HTN predates since 1998. Patient's BP has been controlled at home.Today's BP is 128/74 mmHg. Post-Op Renal Transplant , patient had a NSTMI and CABG. Patient denies any cardiac symptoms as chest pain, palpitations, shortness of breath, dizziness or ankle swelling.   Patient's hyperlipidemia is controlled with diet and medications. Patient denies myalgias or other medication SE's. Last lipids were  Chol 176; HDL  54; LDL 67; Trig 274 on  08/17/2013.   Patient has T1_IDDM  since 2003 and patient has occasional reactive hypoglycemic symptoms,but denies  visual blurring, diabetic polys or paresthesias. He reports CBG's range 75-120 mg%.  Last A1c was  6.2% on  08/17/2013.    Finally, patient has history of Vitamin D Deficiency of 24 in 2014 and last vitamin D was 31 on 08/17/2013.  Medication Sig  . buPROPion  XL 300 MG 24 hr tab TAKE 1 TABLET EVERY MORNING.  . VITAMIN D  Take 1,000 Int'l Units by mouth daily.  . finasteride  5 MG tablet Take 1 tablet  daily.  Marland Kitchen FREESTYLE TEST STRIPS CHECKS BS tid-qid , DX- 250.01   . LANTUS100 UNIT/ML inj Inject 20 Units into the skin 2  times daily.  Marland Kitchen HUMALOG 100 UNIT/ML inj Inject 5 Units  TIDac BS > 110  . Insulin Pen Needle 31G X 5 MM MISC BD ultra fine pen needles.DX-250.01  . Magnesium 500 MG CAPS Take 1,000 mg  daily.  Marland Kitchen MYFORTIC 360 MG TBEC 360 mg 2  times daily.   . pravastatin  40 MG tablet TAKE 1 TAB DAILY  . SEPTRA-DS 400-80 MG  Take 1  tablet  every Monday, Wednesday, and Friday.   . tacrolimus (PROGRAF) 1 MG capsule Take 2 mg  2  times daily.  . TRAVATAN Z 0.004 %ophth soln Place 1 drop into both eyes at bedtime.   Marland Kitchen warfarin  5 MG tablet Take 1 tablet daily.   Allergies  Allergen Reactions  . Crestor [Rosuvastatin]     Elevated LFT's  . Losartan    Past Medical History  Diagnosis Date  . Diabetes mellitus type 2, controlled   . Hyperlipidemia   . BPH (benign prostatic hyperplasia)   . Gout   . CAD (coronary artery disease)     s/p post-op NSTEMI. treated with stent to LAD. at [facility]  . PAF (paroxysmal atrial fibrillation)     s/p DC-CV in 6/13. Off coumadin due to ureteral bleed  . OSA (obstructive sleep apnea)   . 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 HTN (hypertension)   . MI (myocardial infarction) 09/26/2011  . Chronic kidney disease     due IgA nephropathy - s/p kidnet transplant 09/25/11   Past Surgical History  Procedure Laterality Date  . Colonoscopy    . Peritoneal catheter insertion  2011  . Av fistula placement  2011    Left forearm  . Kidney transplant  09/25/2011  . Coronary  angioplasty with stent placement     Family History  Problem Relation Age of Onset  . Heart attack Father   . Diabetes Mother   . COPD Mother    History   Social History  . Marital Status: Married x 39 years     Spouse Name: [name]    Number of Children: N/A  . Years of Education: N/A   Occupational History  . Retired from [employer].   Social History Main Topics  . Smoking status: Former Smoker    Quit date: 11/29/1990  . Smokeless tobacco: Never Used  . Alcohol Use: Yes     Comment: 1-2 a month  . Drug Use: No  . Sexual Activity: InActive x 5 +/- years      ROS Constitutional: Denies fever, chills, weight loss/gain, headaches, insomnia, fatigue, night sweats or change in appetite. Eyes: Denies redness, blurred vision, diplopia, discharge, itchy or watery eyes.  ENT:  Denies discharge, congestion, post nasal drip, epistaxis, sore throat, earache, hearing loss, dental pain, Tinnitus, Vertigo, Sinus pain or snoring.  Cardio: Denies chest pain, palpitations, irregular heartbeat, syncope, dyspnea, diaphoresis, orthopnea, PND, claudication or edema Respiratory: denies cough, dyspnea, DOE, pleurisy, hoarseness, laryngitis or wheezing.  Gastrointestinal: Denies dysphagia, heartburn, reflux, water brash, pain, cramps, nausea, vomiting, bloating, diarrhea, constipation, hematemesis, melena, hematochezia, jaundice or hemorrhoids Genitourinary: Denies dysuria, frequency, urgency, nocturia, hesitancy, discharge, hematuria or flank pain Musculoskeletal: Denies arthralgia, myalgia, stiffness, Jt. Swelling, pain, limp or strain/sprain. Denies Falls. Skin: Denies puritis, rash, hives, warts, acne, eczema or change in skin lesion Neuro: No weakness, tremor, incoordination, spasms, paresthesia or pain Psychiatric: Denies confusion, memory loss or sensory loss. Denies Depression. Endocrine: Denies change in weight, skin, hair change, nocturia, and paresthesia, diabetic polys, visual blurring or hyper / hypo glycemic episodes.  Heme/Lymph: No excessive bleeding, bruising or enlarged lymph nodes.   Physical Exam  BP 128/74  Pulse 64  Temp 97.5 F   Resp 16  Ht 5' 7.5"   Wt 196 lb   BMI 30.23   General Appearance: Well nourished, in no apparent distress. Eyes: PERRLA, EOMs, conjunctiva no swelling or erythema, normal fundi and vessels. Sinuses: No frontal/maxillary tenderness ENT/Mouth: EACs patent / TMs  nl. Nares clear without erythema, swelling, mucoid exudates. Oral hygiene is good. No erythema, swelling, or exudate. Tongue normal, non-obstructing. Tonsils not swollen or erythematous. Hearing normal.  Neck: Supple, thyroid normal. No bruits, nodes or JVD. Respiratory: Respiratory effort normal.  BS equal and clear bilateral without rales, rhonci, wheezing or  stridor. Cardio: Heart sounds are normal with regular rate and rhythm and no murmurs, rubs or gallops. Peripheral pulses are normal and equal bilaterally without edema. No aortic or femoral bruits. Chest: symmetric with normal excursions and percussion.  Abdomen: Flat, soft, with bowl sounds. Nontender, no guarding, rebound, hernias, masses, or organomegaly.  Lymphatics: Non tender without lymphadenopathy.  Genitourinary: No hernias.Testes nl. DRE - prostate nl for age - smooth & firm w/o nodules. Musculoskeletal: Full ROM all peripheral extremities, joint stability, 5/5 strength, and normal gait. Skin: Warm and dry without rashes, lesions, cyanosis, clubbing or  ecchymosis.  Neuro: Cranial nerves intact, reflexes equal bilaterally. Normal muscle tone, no cerebellar symptoms. Sensation intact.  Pysch: Awake and oriented X 3with normal affect, insight and judgment appropriate.  Assessment and Plan  1. Annual Screening Examination 2. Hypertension  3. Hyperlipidemia 4. T1_IDDM 5. Vitamin D Deficiency 6. ESRD s/p Renal Transplant (09/2011) 7. ASCAD s/p CABG (09/2011)   Continue prudent diet as  discussed, weight control, BP monitoring, regular exercise, and medications as discussed.  Discussed med effects and SE's. Routine screening labs and tests as requested with regular follow-up as recommended.  Discussed changing pc humalog SS to 5 u if >180 mg% and tapering his Lantus 20 u bid to 18 u bid to avoid hypoglycemia and also allow weight loss.

## 2013-11-26 NOTE — Patient Instructions (Addendum)
Preventive Care for Adults A healthy lifestyle and preventive care can promote health and wellness. Preventive health guidelines for men include the following key practices:  A routine yearly physical is a good way to check with your health care provider about your health and preventative screening. It is a chance to share any concerns and updates on your health and to receive a thorough exam.  Visit your dentist for a routine exam and preventative care every 6 months. Brush your teeth twice a day and floss once a day. Good oral hygiene prevents tooth decay and gum disease.  The frequency of eye exams is based on your age, health, family medical history, use of contact lenses, and other factors. Follow your health care provider's recommendations for frequency of eye exams.  Eat a healthy diet. Foods such as vegetables, fruits, whole grains, low-fat dairy products, and lean protein foods contain the nutrients you need without too many calories. Decrease your intake of foods high in solid fats, added sugars, and salt. Eat the right amount of calories for you.Get information about a proper diet from your health care provider, if necessary.  Regular physical exercise is one of the most important things you can do for your health. Most adults should get at least 150 minutes of moderate-intensity exercise (any activity that increases your heart rate and causes you to sweat) each week. In addition, most adults need muscle-strengthening exercises on 2 or more days a week.  Maintain a healthy weight. The body mass index (BMI) is a screening tool to identify possible weight problems. It provides an estimate of body fat based on height and weight. Your health care provider can find your BMI and can help you achieve or maintain a healthy weight.For adults 20 years and older:  A BMI below 18.5 is considered underweight.  A BMI of 18.5 to 24.9 is normal.  A BMI of 25 to 29.9 is considered overweight.  A  BMI of 30 and above is considered obese.  Maintain normal blood lipids and cholesterol levels by exercising and minimizing your intake of saturated fat. Eat a balanced diet with plenty of fruit and vegetables. Blood tests for lipids and cholesterol should begin at age 75 and be repeated every 5 years. If your lipid or cholesterol levels are high, you are over 50, or you are at high risk for heart disease, you may need your cholesterol levels checked more frequently.Ongoing high lipid and cholesterol levels should be treated with medicines if diet and exercise are not working.  If you smoke, find out from your health care provider how to quit. If you do not use tobacco, do not start.  Lung cancer screening is recommended for adults aged 39-80 years who are at high risk for developing lung cancer because of a history of smoking. A yearly low-dose CT scan of the lungs is recommended for people who have at least a 30-pack-year history of smoking and are a current smoker or have quit within the past 15 years. A pack year of smoking is smoking an average of 1 pack of cigarettes a day for 1 year (for example: 1 pack a day for 30 years or 2 packs a day for 15 years). Yearly screening should continue until the smoker has stopped smoking for at least 15 years. Yearly screening should be stopped for people who develop a health problem that would prevent them from having lung cancer treatment.  If you choose to drink alcohol, do not have more  than 2 drinks per day. One drink is considered to be 12 ounces (355 mL) of beer, 5 ounces (148 mL) of wine, or 1.5 ounces (44 mL) of liquor.  Avoid use of street drugs. Do not share needles with anyone. Ask for help if you need support or instructions about stopping the use of drugs.  High blood pressure causes heart disease and increases the risk of stroke. Your blood pressure should be checked at least every 1-2 years. Ongoing high blood pressure should be treated with  medicines, if weight loss and exercise are not effective.  If you are 74-72 years old, ask your health care provider if you should take aspirin to prevent heart disease.  Diabetes screening involves taking a blood sample to check your fasting blood sugar level. This should be done once every 3 years, after age 46, if you are within normal weight and without risk factors for diabetes. Testing should be considered at a younger age or be carried out more frequently if you are overweight and have at least 1 risk factor for diabetes.  Colorectal cancer can be detected and often prevented. Most routine colorectal cancer screening begins at the age of 35 and continues through age 10. However, your health care provider may recommend screening at an earlier age if you have risk factors for colon cancer. On a yearly basis, your health care provider may provide home test kits to check for hidden blood in the stool. Use of a small camera at the end of a tube to directly examine the colon (sigmoidoscopy or colonoscopy) can detect the earliest forms of colorectal cancer. Talk to your health care provider about this at age 87, when routine screening begins. Direct exam of the colon should be repeated every 5-10 years through age 29, unless early forms of precancerous polyps or small growths are found.  People who are at an increased risk for hepatitis B should be screened for this virus. You are considered at high risk for hepatitis B if:  You were born in a country where hepatitis B occurs often. Talk with your health care provider about which countries are considered high risk.  Your parents were born in a high-risk country and you have not received a shot to protect against hepatitis B (hepatitis B vaccine).  You have HIV or AIDS.  You use needles to inject street drugs.  You live with, or have sex with, someone who has hepatitis B.  You are a man who has sex with other men (MSM).  You get hemodialysis  treatment.  You take certain medicines for conditions such as cancer, organ transplantation, and autoimmune conditions.  Hepatitis C blood testing is recommended for all people born from [birth year] through 1965 and any individual with known risks for hepatitis C.  Practice safe sex. Use condoms and avoid high-risk sexual practices to reduce the spread of sexually transmitted infections (STIs). STIs include gonorrhea, chlamydia, syphilis, trichomonas, herpes, HPV, and human immunodeficiency virus (HIV). Herpes, HIV, and HPV are viral illnesses that have no cure. They can result in disability, cancer, and death.  If you are at risk of being infected with HIV, it is recommended that you take a prescription medicine daily to prevent HIV infection. This is called preexposure prophylaxis (PrEP). You are considered at risk if:  You are a man who has sex with other men (MSM) and have other risk factors.  You are a heterosexual man, are sexually active, and are at increased risk for HIV  infection.  You take drugs by injection.  You are sexually active with a partner who has HIV.  Talk with your health care provider about whether you are at high risk of being infected with HIV. If you choose to begin PrEP, you should first be tested for HIV. You should then be tested every 3 months for as long as you are taking PrEP.  A one-time screening for abdominal aortic aneurysm (AAA) and surgical repair of large AAAs by ultrasound are recommended for men ages 52 to 64 years who are current or former smokers.  Healthy men should no longer receive prostate-specific antigen (PSA) blood tests as part of routine cancer screening. Talk with your health care provider about prostate cancer screening.  Testicular cancer screening is not recommended for adult males who have no symptoms. Screening includes self-exam, a health care provider exam, and other screening tests. Consult with your health care provider about any symptoms  you have or any concerns you have about testicular cancer.  Use sunscreen. Apply sunscreen liberally and repeatedly throughout the day. You should seek shade when your shadow is shorter than you. Protect yourself by wearing long sleeves, pants, a wide-brimmed hat, and sunglasses year round, whenever you are outdoors.  Once a month, do a whole-body skin exam, using a mirror to look at the skin on your back. Tell your health care provider about new moles, moles that have irregular borders, moles that are larger than a pencil eraser, or moles that have changed in shape or color.  Stay current with required vaccines (immunizations).  Influenza vaccine. All adults should be immunized every year.  Tetanus, diphtheria, and acellular pertussis (Td, Tdap) vaccine. An adult who has not previously received Tdap or who does not know his vaccine status should receive 1 dose of Tdap. This initial dose should be followed by tetanus and diphtheria toxoids (Td) booster doses every 10 years. Adults with an unknown or incomplete history of completing a 3-dose immunization series with Td-containing vaccines should begin or complete a primary immunization series including a Tdap dose. Adults should receive a Td booster every 10 years.  Varicella vaccine. An adult without evidence of immunity to varicella should receive 2 doses or a second dose if he has previously received 1 dose.  Human papillomavirus (HPV) vaccine. Males aged 31-21 years who have not received the vaccine previously should receive the 3-dose series. Males aged 22-26 years may be immunized. Immunization is recommended through the age of 54 years for any male who has sex with males and did not get any or all doses earlier. Immunization is recommended for any person with an immunocompromised condition through the age of 50 years if he did not get any or all doses earlier. During the 3-dose series, the second dose should be obtained 4-8 weeks after the first  dose. The third dose should be obtained 24 weeks after the first dose and 16 weeks after the second dose.  Zoster vaccine. One dose is recommended for adults aged 61 years or older unless certain conditions are present.  Measles, mumps, and rubella (MMR) vaccine. Adults born before [birth year] generally are considered immune to measles and mumps. Adults born in [birth year] or later should have 1 or more doses of MMR vaccine unless there is a contraindication to the vaccine or there is laboratory evidence of immunity to each of the three diseases. A routine second dose of MMR vaccine should be obtained at least 28 days after the first dose for students  attending postsecondary schools, health care workers, or international travelers. People who received inactivated measles vaccine or an unknown type of measles vaccine during 1963-1967 should receive 2 doses of MMR vaccine. People who received inactivated mumps vaccine or an unknown type of mumps vaccine before 1979 and are at high risk for mumps infection should consider immunization with 2 doses of MMR vaccine. Unvaccinated health care workers born before [birth year] who lack laboratory evidence of measles, mumps, or rubella immunity or laboratory confirmation of disease should consider measles and mumps immunization with 2 doses of MMR vaccine or rubella immunization with 1 dose of MMR vaccine.  Pneumococcal 13-valent conjugate (PCV13) vaccine. When indicated, a person who is uncertain of his immunization history and has no record of immunization should receive the PCV13 vaccine. An adult aged 20 years or older who has certain medical conditions and has not been previously immunized should receive 1 dose of PCV13 vaccine. This PCV13 should be followed with a dose of pneumococcal polysaccharide (PPSV23) vaccine. The PPSV23 vaccine dose should be obtained at least 8 weeks after the dose of PCV13 vaccine. An adult aged 40 years or older who has certain medical conditions and  previously received 1 or more doses of PPSV23 vaccine should receive 1 dose of PCV13. The PCV13 vaccine dose should be obtained 1 or more years after the last PPSV23 vaccine dose.  Pneumococcal polysaccharide (PPSV23) vaccine. When PCV13 is also indicated, PCV13 should be obtained first. All adults aged 93 years and older should be immunized. An adult younger than age 80 years who has certain medical conditions should be immunized. Any person who resides in a nursing home or long-term care facility should be immunized. An adult smoker should be immunized. People with an immunocompromised condition and certain other conditions should receive both PCV13 and PPSV23 vaccines. People with human immunodeficiency virus (HIV) infection should be immunized as soon as possible after diagnosis. Immunization during chemotherapy or radiation therapy should be avoided. Routine use of PPSV23 vaccine is not recommended for American Indians, [place] Natives, or people younger than 65 years unless there are medical conditions that require PPSV23 vaccine. When indicated, people who have unknown immunization and have no record of immunization should receive PPSV23 vaccine. One-time revaccination 5 years after the first dose of PPSV23 is recommended for people aged 19-64 years who have chronic kidney failure, nephrotic syndrome, asplenia, or immunocompromised conditions. People who received 1-2 doses of PPSV23 before age 49 years should receive another dose of PPSV23 vaccine at age 25 years or later if at least 5 years have passed since the previous dose. Doses of PPSV23 are not needed for people immunized with PPSV23 at or after age 37 years.  Meningococcal vaccine. Adults with asplenia or persistent complement component deficiencies should receive 2 doses of quadrivalent meningococcal conjugate (MenACWY-D) vaccine. The doses should be obtained at least 2 months apart. Microbiologists working with certain meningococcal bacteria,  [employer] recruits, people at risk during an outbreak, and people who travel to or live in countries with a high rate of meningitis should be immunized. A first-year college student up through age 58 years who is living in a residence hall should receive a dose if he did not receive a dose on or after his 16th birthday. Adults who have certain high-risk conditions should receive one or more doses of vaccine.  Hepatitis A vaccine. Adults who wish to be protected from this disease, have certain high-risk conditions, work with hepatitis A-infected animals, work in hepatitis A research  labs, or travel to or work in countries with a high rate of hepatitis A should be immunized. Adults who were previously unvaccinated and who anticipate close contact with an international adoptee during the first 60 days after arrival in the [place] from a country with a high rate of hepatitis A should be immunized.  Hepatitis B vaccine. Adults should be immunized if they wish to be protected from this disease, have certain high-risk conditions, may be exposed to blood or other infectious body fluids, are household contacts or sex partners of hepatitis B positive people, are clients or workers in certain care facilities, or travel to or work in countries with a high rate of hepatitis B.  Haemophilus influenzae type b (Hib) vaccine. A previously unvaccinated person with asplenia or sickle cell disease or having a scheduled splenectomy should receive 1 dose of Hib vaccine. Regardless of previous immunization, a recipient of a hematopoietic stem cell transplant should receive a 3-dose series 6-12 months after his successful transplant. Hib vaccine is not recommended for adults with HIV infection. Preventive Service / Frequency Ages 53 to 30  Blood pressure check.** / Every 1 to 2 years.  Lipid and cholesterol check.** / Every 5 years beginning at age 65.  Lung cancer screening. / Every year if you are aged 52-80 years and  have a 30-pack-year history of smoking and currently smoke or have quit within the past 15 years. Yearly screening is stopped once you have quit smoking for at least 15 years or develop a health problem that would prevent you from having lung cancer treatment.  Fecal occult blood test (FOBT) of stool. / Every year beginning at age 107 and continuing until age 62. You may not have to do this test if you get a colonoscopy every 10 years.  Flexible sigmoidoscopy** or colonoscopy.** / Every 5 years for a flexible sigmoidoscopy or every 10 years for a colonoscopy beginning at age 16 and continuing until age 3.  Hepatitis C blood test.** / For all people born from [birth year] through 1963 and any individual with known risks for hepatitis C.  Skin self-exam. / Monthly.  Influenza vaccine. / Every year.  Tetanus, diphtheria, and acellular pertussis (Tdap/Td) vaccine.** / Consult your health care provider. 1 dose of Td every 10 years.  Varicella vaccine.** / Consult your health care provider.  Zoster vaccine.** / 1 dose for adults aged 58 years or older.  Measles, mumps, rubella (MMR) vaccine.** / You need at least 1 dose of MMR if you were born in 1957 or later. You may also need a second dose.  Pneumococcal 13-valent conjugate (PCV13) vaccine.** / Consult your health care provider.  Pneumococcal polysaccharide (PPSV23) vaccine.** / 1 to 2 doses if you smoke cigarettes or if you have certain conditions.  Meningococcal vaccine.** / Consult your health care provider.  Hepatitis A vaccine.** / Consult your health care provider.  Hepatitis B vaccine.** / Consult your health care provider.  Haemophilus influenzae type b (Hib) vaccine.** / Consult your health care provider. Ages 93 and over  Blood pressure check.** / Every 1 to 2 years.  Lipid and cholesterol check.**/ Every 5 years beginning at age 29.  Lung cancer screening. / Every year if you are aged 28-80 years and have a 30-pack-year history  of smoking and currently smoke or have quit within the past 15 years. Yearly screening is stopped once you have quit smoking for at least 15 years or develop a health problem that would prevent you  from having lung cancer treatment.  Fecal occult blood test (FOBT) of stool. / Every year beginning at age 76 and continuing until age 84. You may not have to do this test if you get a colonoscopy every 10 years.  Flexible sigmoidoscopy** or colonoscopy.** / Every 5 years for a flexible sigmoidoscopy or every 10 years for a colonoscopy beginning at age 78 and continuing until age 89.  Hepatitis C blood test.** / For all people born from [birth year] through 1963 and any individual with known risks for hepatitis C.  Abdominal aortic aneurysm (AAA) screening.** / Screening for patients who are current or former smokers or have history of hypertension or Diabetes.  Skin self-exam. / Monthly.  Influenza vaccine. / Every year.  Tetanus, diphtheria, and acellular pertussis (Tdap/Td) vaccine.** / 1 dose of Td every 10 years.  Varicella vaccine.** / Consult your health care provider.  Zoster vaccine.** / 1 dose for adults aged 41 years or older.  Pneumococcal 13-valent conjugate (PCV13) vaccine.** / Consult your health care provider.  Pneumococcal polysaccharide (PPSV23) vaccine.** / 1 dose for all adults aged 43 years and older.  Meningococcal vaccine.** / Consult your health care provider.  Hepatitis A vaccine.** / Consult your health care provider.  Hepatitis B vaccine.** / Consult your health care provider.  Haemophilus influenzae type b (Hib) vaccine.** / Consult your health care provider.

## 2013-11-27 LAB — URINALYSIS, MICROSCOPIC ONLY
Bacteria, UA: NONE SEEN
Casts: NONE SEEN
Crystals: NONE SEEN
SQUAMOUS EPITHELIAL / LPF: NONE SEEN

## 2013-11-27 LAB — MICROALBUMIN / CREATININE URINE RATIO
CREATININE, URINE: 89.3 mg/dL
MICROALB/CREAT RATIO: 6.2 mg/g (ref 0.0–30.0)
Microalb, Ur: 0.55 mg/dL (ref 0.00–1.89)

## 2013-11-27 LAB — HEMOGLOBIN A1C
Hgb A1c MFr Bld: 6.4 % — ABNORMAL HIGH (ref <5.7)
Mean Plasma Glucose: 137 mg/dL — ABNORMAL HIGH (ref <117)

## 2013-11-27 LAB — LIPID PANEL
CHOLESTEROL: 170 mg/dL (ref 0–200)
HDL: 49 mg/dL (ref >39)
LDL CALC: 98 mg/dL (ref 0–99)
Total CHOL/HDL Ratio: 3.5 Ratio
Triglycerides: 117 mg/dL (ref <150)
VLDL: 23 mg/dL (ref 0–40)

## 2013-11-27 LAB — TSH: TSH: 1.46 u[IU]/mL (ref 0.350–4.500)

## 2013-11-27 LAB — VITAMIN D 25 HYDROXY (VIT D DEFICIENCY, FRACTURES): VIT D 25 HYDROXY: 81 ng/mL (ref 30–89)

## 2013-12-27 ENCOUNTER — Ambulatory Visit (INDEPENDENT_AMBULATORY_CARE_PROVIDER_SITE_OTHER): Payer: Medicare Other | Admitting: Physician Assistant

## 2013-12-27 ENCOUNTER — Encounter: Payer: Self-pay | Admitting: Internal Medicine

## 2013-12-27 VITALS — BP 126/78 | HR 68 | Temp 98.1°F | Resp 16 | Wt 194.4 lb

## 2013-12-27 DIAGNOSIS — I480 Paroxysmal atrial fibrillation: Secondary | ICD-10-CM

## 2013-12-27 DIAGNOSIS — Z23 Encounter for immunization: Secondary | ICD-10-CM

## 2013-12-27 DIAGNOSIS — I4891 Unspecified atrial fibrillation: Secondary | ICD-10-CM

## 2013-12-27 DIAGNOSIS — I2510 Atherosclerotic heart disease of native coronary artery without angina pectoris: Secondary | ICD-10-CM

## 2013-12-27 DIAGNOSIS — Z7901 Long term (current) use of anticoagulants: Secondary | ICD-10-CM

## 2013-12-27 LAB — PROTIME-INR
INR: 2.2 — AB (ref <1.50)
Prothrombin Time: 24.4 seconds — ABNORMAL HIGH (ref 11.6–15.2)

## 2013-12-27 NOTE — Progress Notes (Signed)
Coumadin follow up  Patient is on Coumadin for Primary Diagnosis: Paroxysmal atrial fibrillation [427.31] Patient's last INR is  Lab Results  Component Value Date   INR 1.95* 11/26/2013   INR 1.21 10/25/2013   INR 1.23 10/12/2013    Patient denies SOB, CP, dizziness, nose bleeds, easy bleeding, and blood in stool/urine. His coumadin dose was not changed last visit.    Current Outpatient Prescriptions on File Prior to Visit  Medication Sig Dispense Refill  . buPROPion (WELLBUTRIN XL) 300 MG 24 hr tablet TAKE 1 TABLET (300 MG TOTAL) BY MOUTH EVERY MORNING.  30 tablet  3  . Cholecalciferol (VITAMIN D PO) Take 10,000 Int'l Units by mouth daily.       . finasteride (PROSCAR) 5 MG tablet Take 1 tablet (5 mg total) by mouth daily.  90 tablet  1  . FREESTYLE LITE test strip       . glucose blood (FREESTYLE TEST STRIPS) test strip CHECKS BS tid-qid FOR MEDICATION ADJUSTMENT, DX- 250.01 NPI [id number]  450 each  PRN  . insulin glargine (LANTUS) 100 UNIT/ML injection Inject 20 Units into the skin 2 (two) times daily.      . insulin lispro (HUMALOG) 100 UNIT/ML injection Inject 5 Units into the skin 3 (three) times daily before meals. ONLY IF BS GREATER THAN 110      . Insulin Pen Needle 31G X 5 MM MISC BD ultra fine pen needles.  Patient checks glucose 2 times daily.  DX-250.01  50 each  11  . lisdexamfetamine (VYVANSE) 60 MG capsule Take by mouth.      . Magnesium 500 MG CAPS Take 1,000 mg by mouth daily.      . mycophenolate (MYFORTIC) 360 MG TBEC EC tablet Take by mouth.      Marland Kitchen MYFORTIC 360 MG TBEC 360 mg 2 (two) times daily.       Marland Kitchen NOVOLIN N RELION 100 UNIT/ML injection       . pravastatin (PRAVACHOL) 40 MG tablet TAKE 1 TABLET BY MOUTH DAILY  90 tablet  3  . sulfamethoxazole-trimethoprim (BACTRIM,SEPTRA) 400-80 MG per tablet Take 1 tablet by mouth every Monday, Wednesday, and Friday.       . tacrolimus (PROGRAF) 1 MG capsule Take 2 mg by mouth 2 (two) times daily.      . TRAVATAN Z 0.004 % SOLN  ophthalmic solution Place 1 drop into both eyes at bedtime.       Marland Kitchen warfarin (COUMADIN) 5 MG tablet Take 5 mg by mouth daily. Takes 5 mg on M, W,F and 7.5 mg the other 4 days.       No current facility-administered medications on file prior to visit.   Past Medical History  Diagnosis Date  . Diabetes mellitus type 2, controlled   . Hyperlipidemia   . BPH (benign prostatic hyperplasia)   . Gout   . CAD (coronary artery disease)     s/p post-op NSTEMI. treated with stent to LAD. at [facility]  . PAF (paroxysmal atrial fibrillation)     s/p DC-CV in 6/13. Off coumadin due to ureteral bleed  . OSA (obstructive sleep apnea)   . 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 HTN (hypertension)   . MI (myocardial infarction) 09/26/2011  . 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 Crestor [Rosuvastatin]     Elevated LFT's  . Losartan  ROS Constitutional: Denies fever, chills, headaches, fatigue. Cardio: Denies chest pain, palpitations, irregular heartbeat, syncope, dyspnea, diaphoresis, orthopnea, PND, claudication, edema Respiratory: denies cough, dyspnea, DOE, pleurisy, hoarseness, laryngitis, wheezing.  Gastrointestinal: Denies dysphagia, heartburn, reflux, pain, cramps, nausea, diarrhea, constipation, hematemesis, melena, hematochezia Genitourinary: Denies dysuria, frequency, hematuria, flank pain Musculoskeletal: Denies arthralgia, myalgia, stiffness, Jt. Swelling, pain, limp, strain/sprain. Skin: Denies rash, ecchymosis, petechial. Neuro: Denies Weakness, tremor, incoordination, spasms, paresthesia, pain Heme/Lymph: Denies Excessive bleeding, bruising, enlarged lymph nodes  Physical: Blood pressure 126/78, pulse 68, temperature 98.1 F (36.7 C), resp. rate 16, weight 194 lb 6.4 oz (88.179 kg). Filed Weights   12/27/13 1223  Weight: 194 lb 6.4 oz (88.179 kg)    General  Appearance: Well nourished, in no apparent distress. ENT/Mouth: Nares clear with no erythema, swelling, mucus on turbinates. No ulcers, cracking, on lips. No erythema, swelling, or exudate on post pharynx.  Neck: Supple, thyroid normal.  Respiratory: CTAB   Cardio: RRR, no murmurs, rubs or gallops. No edema  Abdomen: Soft, with bowl sounds. Non tender, no guarding, rebound, hernias, masses, or organomegaly.  Skin: Warm, dry without rashes, lesions, ecchymosis.  Neuro: Unremarkable  Assessment and plan: Chronic anticoagulation- check INR and will adjust medication according to labs.  Discussed if patient falls to immediately contact office or go to ER. Discussed foods that can increase or decrease Coumadin levels. Patient understands to call the office before starting a new medication. Follow up in one month.   High dose flu today

## 2014-01-04 ENCOUNTER — Other Ambulatory Visit: Payer: Self-pay | Admitting: Internal Medicine

## 2014-01-10 ENCOUNTER — Other Ambulatory Visit (INDEPENDENT_AMBULATORY_CARE_PROVIDER_SITE_OTHER): Payer: Medicare Other | Admitting: *Deleted

## 2014-01-10 DIAGNOSIS — Z1212 Encounter for screening for malignant neoplasm of rectum: Secondary | ICD-10-CM

## 2014-01-10 LAB — POC HEMOCCULT BLD/STL (HOME/3-CARD/SCREEN)
Card #3 Fecal Occult Blood, POC: NEGATIVE
FECAL OCCULT BLD: NEGATIVE
Fecal Occult Blood, POC: NEGATIVE

## 2014-01-26 ENCOUNTER — Ambulatory Visit (INDEPENDENT_AMBULATORY_CARE_PROVIDER_SITE_OTHER): Payer: Medicare Other | Admitting: Physician Assistant

## 2014-01-26 VITALS — BP 110/62 | HR 60 | Temp 97.7°F | Resp 16 | Ht 67.5 in | Wt 196.0 lb

## 2014-01-26 DIAGNOSIS — I2510 Atherosclerotic heart disease of native coronary artery without angina pectoris: Secondary | ICD-10-CM

## 2014-01-26 DIAGNOSIS — Z7901 Long term (current) use of anticoagulants: Secondary | ICD-10-CM

## 2014-01-26 DIAGNOSIS — I480 Paroxysmal atrial fibrillation: Secondary | ICD-10-CM

## 2014-01-26 LAB — PROTIME-INR
INR: 2.38 — AB (ref <1.50)
PROTHROMBIN TIME: 26 s — AB (ref 11.6–15.2)

## 2014-01-26 NOTE — Progress Notes (Signed)
Coumadin follow up  Patient is on Coumadin for Primary Diagnosis: PAF (paroxysmal atrial fibrillation) [I48.0] Patient's last INR is  Lab Results  Component Value Date   INR 2.20* 12/27/2013   INR 1.95* 11/26/2013   INR 1.21 10/25/2013    Patient denies SOB, CP, dizziness, nose bleeds, easy bleeding, and blood in stool/urine. His coumadin dose was not changed last visit.  No ABX use, no missed doses.  Recently saw his nephrologist and his CR went up, deceased his prograf and will get it rechecked.    Current Outpatient Prescriptions on File Prior to Visit  Medication Sig Dispense Refill  . buPROPion (WELLBUTRIN XL) 300 MG 24 hr tablet TAKE 1 TABLET (300 MG TOTAL) BY MOUTH EVERY MORNING.  30 tablet  3  . Cholecalciferol (VITAMIN D PO) Take 10,000 Int'l Units by mouth daily.       . finasteride (PROSCAR) 5 MG tablet Take 1 tablet (5 mg total) by mouth daily.  90 tablet  1  . FREESTYLE LITE test strip       . glucose blood (FREESTYLE TEST STRIPS) test strip CHECKS BS tid-qid FOR MEDICATION ADJUSTMENT, DX- 250.01 NPI [id number]  450 each  PRN  . HUMALOG KWIKPEN 100 UNIT/ML KiwkPen INJECT 30 UNITS THREE TIMES DAILY AS DIRECTED  30 mL  5  . insulin glargine (LANTUS) 100 UNIT/ML injection Inject 20 Units into the skin 2 (two) times daily.      . insulin lispro (HUMALOG) 100 UNIT/ML injection Inject 5 Units into the skin 3 (three) times daily before meals. ONLY IF BS GREATER THAN 110      . Insulin Pen Needle 31G X 5 MM MISC BD ultra fine pen needles.  Patient checks glucose 2 times daily.  DX-250.01  50 each  11  . lisdexamfetamine (VYVANSE) 60 MG capsule Take by mouth.      . Magnesium 500 MG CAPS Take 1,000 mg by mouth daily.      . mycophenolate (MYFORTIC) 360 MG TBEC EC tablet Take by mouth.      Marland Kitchen MYFORTIC 360 MG TBEC 360 mg 2 (two) times daily.       Marland Kitchen NOVOLIN N RELION 100 UNIT/ML injection       . pravastatin (PRAVACHOL) 40 MG tablet TAKE 1 TABLET BY MOUTH DAILY  90 tablet  3  .  sulfamethoxazole-trimethoprim (BACTRIM,SEPTRA) 400-80 MG per tablet Take 1 tablet by mouth every Monday, Wednesday, and Friday.       . tacrolimus (PROGRAF) 1 MG capsule Take 2 mg by mouth 2 (two) times daily.      . TRAVATAN Z 0.004 % SOLN ophthalmic solution Place 1 drop into both eyes at bedtime.       Marland Kitchen warfarin (COUMADIN) 5 MG tablet Take 5 mg by mouth daily. Takes 5 mg on M, W,F and 7.5 mg the other 4 days.       No current facility-administered medications on file prior to visit.   Past Medical History  Diagnosis Date  . Diabetes mellitus type 2, controlled   . Hyperlipidemia   . BPH (benign prostatic hyperplasia)   . Gout   . CAD (coronary artery disease)     s/p post-op NSTEMI. treated with stent to LAD. at [facility]  . PAF (paroxysmal atrial fibrillation)     s/p DC-CV in 6/13. Off coumadin due to ureteral bleed  . OSA (obstructive sleep apnea)   . 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  HTN (hypertension)   . MI (myocardial infarction) 09/26/2011  . 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 Crestor [Rosuvastatin]     Elevated LFT's  . Losartan     ROS Constitutional: Denies fever, chills, headaches, fatigue. Cardio: Denies chest pain, palpitations, irregular heartbeat, syncope, dyspnea, diaphoresis, orthopnea, PND, claudication, edema Respiratory: denies cough, dyspnea, DOE, pleurisy, hoarseness, laryngitis, wheezing.  Gastrointestinal: Denies dysphagia, heartburn, reflux, pain, cramps, nausea, diarrhea, constipation, hematemesis, melena, hematochezia Genitourinary: Denies dysuria, frequency, hematuria, flank pain Musculoskeletal: Denies arthralgia, myalgia, stiffness, Jt. Swelling, pain, limp, strain/sprain. Skin: Denies rash, ecchymosis, petechial. Neuro: Denies Weakness, tremor, incoordination, spasms, paresthesia, pain Heme/Lymph: Denies Excessive bleeding,  bruising, enlarged lymph nodes  Physical: Blood pressure 110/62, pulse 60, temperature 97.7 F (36.5 C), resp. rate 16, height 5' 7.5" (1.715 m), weight 196 lb (88.905 kg). Filed Weights   01/26/14 1146  Weight: 196 lb (88.905 kg)    General Appearance: Well nourished, in no apparent distress. ENT/Mouth: Nares clear with no erythema, swelling, mucus on turbinates. No ulcers, cracking, on lips. No erythema, swelling, or exudate on post pharynx.  Neck: Supple, thyroid normal.  Respiratory: CTAB   Cardio: RRR, no murmurs, rubs or gallops. No edema  Abdomen: Soft, with bowl sounds. Non tender, no guarding, rebound, hernias, masses, or organomegaly.  Skin: Warm, dry without rashes, lesions, + ecchymosis bilateral arms.  Neuro: Unremarkable  Assessment and plan: Chronic anticoagulation- check INR and will adjust medication according to labs.  Discussed if patient falls to immediately contact office or go to ER. Discussed foods that can increase or decrease Coumadin levels. Patient understands to call the office before starting a new medication. Follow up in one month.

## 2014-01-26 NOTE — Patient Instructions (Signed)

## 2014-02-05 ENCOUNTER — Other Ambulatory Visit: Payer: Self-pay | Admitting: Internal Medicine

## 2014-02-10 ENCOUNTER — Other Ambulatory Visit: Payer: Self-pay | Admitting: Emergency Medicine

## 2014-02-11 ENCOUNTER — Encounter: Payer: Self-pay | Admitting: Internal Medicine

## 2014-02-12 ENCOUNTER — Encounter: Payer: Self-pay | Admitting: Internal Medicine

## 2014-02-12 ENCOUNTER — Other Ambulatory Visit: Payer: Self-pay | Admitting: Internal Medicine

## 2014-02-12 MED ORDER — INSULIN NPH ISOPHANE & REGULAR (70-30) 100 UNIT/ML ~~LOC~~ SUSP: SUBCUTANEOUS | Status: DC

## 2014-02-25 ENCOUNTER — Ambulatory Visit (INDEPENDENT_AMBULATORY_CARE_PROVIDER_SITE_OTHER): Payer: Medicare Other | Admitting: Internal Medicine

## 2014-02-25 ENCOUNTER — Encounter: Payer: Self-pay | Admitting: Internal Medicine

## 2014-02-25 VITALS — BP 120/64 | HR 76 | Temp 97.7°F | Resp 16 | Ht 67.0 in | Wt 195.0 lb

## 2014-02-25 DIAGNOSIS — E1022 Type 1 diabetes mellitus with diabetic chronic kidney disease: Secondary | ICD-10-CM

## 2014-02-25 DIAGNOSIS — E785 Hyperlipidemia, unspecified: Secondary | ICD-10-CM

## 2014-02-25 DIAGNOSIS — Z79899 Other long term (current) drug therapy: Secondary | ICD-10-CM

## 2014-02-25 DIAGNOSIS — I151 Hypertension secondary to other renal disorders: Secondary | ICD-10-CM

## 2014-02-25 DIAGNOSIS — E559 Vitamin D deficiency, unspecified: Secondary | ICD-10-CM

## 2014-02-25 DIAGNOSIS — Z7901 Long term (current) use of anticoagulants: Secondary | ICD-10-CM

## 2014-02-25 DIAGNOSIS — N2889 Other specified disorders of kidney and ureter: Secondary | ICD-10-CM

## 2014-02-25 NOTE — Progress Notes (Signed)
Patient ID: Robert Arnold, male   DOB: 1946/02/25, 68 y.o.   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    Patient is treated for HTN & BP has been controlled at home. Patient's HTN predates from 1998 and concomittant IgA Nephropathy discovered in 2004 culminating with Dialysis in Nov 2011. 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 He remains on immunosuppressive drugs.Today's BP: 120/64 mmHg. Patient has had no complaints of any cardiac type chest pain, palpitations, dyspnea/orthopnea/PND, dizziness, claudication, or dependent edema. Patient does monitor his BP frequently up to 3-4 x /day.    Hyperlipidemia is controlled with diet & meds. Patient denies myalgias or other med SE's. Last Lipids were not at goal with LDL <70 and last Total Chol was 170; HDL 49; LDL 98; and Trig 117 on  11/26/2013.   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  Last A1c was 6.4% on  11/26/2013. Patient reports checking his CBG's 3 to 4 x/day with values less than 140 mg% as supported by his A1c's usually less than 6.4%. Recently for cost concerns in the "[facility]" he was switched from Lantus/humalog to Novolin 70/30 on a bid scheduled at [employer].    Further, the patient also has history of Vitamin D Deficiency and supplements vitamin D without any suspected side-effects. Last vitamin D was  81 on 11/26/2013.    Medication List   finasteride 5 MG tablet  Commonly known as:  PROSCAR  Take 1 tablet (5 mg total) by mouth daily.     FREESTYLE LITE test strip  Generic drug:  glucose blood     glucose blood test strip  Commonly known as:  FREESTYLE TEST STRIPS  CHECKS BS tid-qid FOR MEDICATION ADJUSTMENT, DX- 250.01 NPI [id number]     hydrOXYzine 25 MG capsule   Commonly known as:  VISTARIL     hydrOXYzine 25 MG tablet  Commonly known as:  ATARAX/VISTARIL  TAKE 1 OR 2 TABLETS BY MOUTH 2 TO 4 TIMES DAILY AS NEEDED FOR ANXIETY     insulin NPH-regular Human (70-30) 100 UNIT/ML injection  Commonly known as:  NOVOLIN 70/30  Take 20-30 units 2 x day or as directed     Insulin Pen Needle 31G X 5 MM Misc  BD ultra fine pen needles.  Patient checks glucose 2 times daily.  DX-250.01     Magnesium 500 MG Caps  Take 1,000 mg by mouth daily.     mycophenolate 360 MG Tbec EC tablet  Commonly known as:  MYFORTIC  Take by mouth.     MYFORTIC 360 MG Tbec EC tablet  Generic drug:  mycophenolate  360 mg 2 (two) times daily.     pravastatin 40 MG tablet  Commonly known as:  PRAVACHOL  TAKE 1 TABLET BY MOUTH DAILY     sulfamethoxazole-trimethoprim 400-80 MG per tablet  Commonly known as:  BACTRIM,SEPTRA  Take 1 tablet by mouth every Monday, Wednesday, and Friday.     tacrolimus 1 MG capsule  Commonly known as:  PROGRAF     tacrolimus 0.5 MG capsule  Commonly known as:  PROGRAF     TRAVATAN Z 0.004 % Soln ophthalmic solution  Generic drug:  Travoprost (BAK Free)  Place 1 drop into both eyes  at bedtime.     VITAMIN D PO  Take 10,000 Int'l Units by mouth daily.     VYVANSE 60 MG capsule  Generic drug:  lisdexamfetamine  Take by mouth.     warfarin 5 MG tablet  Commonly known as:  COUMADIN  Take 5 mg by mouth daily. Takes 5 mg on M, W,F and 7.5 mg the other 4 days.     Allergies  Allergen Reactions  . Crestor [Rosuvastatin]     Elevated LFT's  . Losartan    PMHx:   Past Medical History  Diagnosis Date  . Diabetes mellitus type 2, controlled   . Hyperlipidemia   . BPH (benign prostatic hyperplasia)   . Gout   . CAD (coronary artery disease)     s/p post-op NSTEMI. treated with stent to LAD. at [facility]  . PAF (paroxysmal atrial fibrillation)     s/p DC-CV in 6/13. Off coumadin due to ureteral bleed  . OSA  (obstructive sleep apnea)   . 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 HTN (hypertension)   . MI (myocardial infarction) 09/26/2011  . 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 DTaP 04/08/2008  . Hepatitis B 04/08/2009  . Influenza, High Dose Seasonal PF 12/27/2013  . Pneumococcal Conjugate-13 10/25/2013  . Pneumococcal-Unspecified 04/08/2008   Past Surgical History  Procedure Laterality Date  . Colonoscopy    . Peritoneal catheter insertion  2011  . Av fistula placement  2011    Left forearm  . Kidney transplant  09/25/2011  . Coronary angioplasty with stent placement     FHx:    Reviewed / unchanged  SHx:    Reviewed / unchanged  Systems Review:  Constitutional: Denies fever, chills, wt changes, headaches, insomnia, fatigue, night sweats, change in appetite. Eyes: Denies redness, blurred vision, diplopia, discharge, itchy, watery eyes.  ENT: Denies discharge, congestion, post nasal drip, epistaxis, sore throat, earache, hearing loss, dental pain, tinnitus, vertigo, sinus pain, snoring.  CV: Denies chest pain, palpitations, irregular heartbeat, syncope, dyspnea, diaphoresis, orthopnea, PND, claudication or edema. Respiratory: denies cough, dyspnea, DOE, pleurisy, hoarseness, laryngitis, wheezing.  Gastrointestinal: Denies dysphagia, odynophagia, heartburn, reflux, water brash, abdominal pain or cramps, nausea, vomiting, bloating, diarrhea, constipation, hematemesis, melena, hematochezia  or hemorrhoids. Genitourinary: Denies dysuria, frequency, urgency, nocturia, hesitancy, discharge, hematuria or flank pain. Musculoskeletal: Denies arthralgias, myalgias, stiffness, jt. swelling, pain, limping or strain/sprain.  Skin: Denies pruritus, rash, hives, warts, acne, eczema or change in skin lesion(s). Neuro: No weakness, tremor, incoordination, spasms, paresthesia or  pain. Psychiatric: Denies confusion, memory loss or sensory loss. Endo: Denies change in weight, skin or hair change.  Heme/Lymph: No excessive bleeding, bruising or enlarged lymph nodes.  Exam:  BP 120/64   Pulse 76  Temp 97.7 F   Resp 16  Ht 5\' 7"    Wt 195 lb   BMI 30.53   Appears well nourished and in no distress. Eyes: PERRLA, EOMs, conjunctiva no swelling or erythema. Sinuses: No frontal/maxillary tenderness ENT/Mouth: EAC's clear, TM's nl w/o erythema, bulging. Nares clear w/o erythema, swelling, exudates. Oropharynx clear without erythema or exudates. Oral hygiene is good. Tongue normal, non obstructing. Hearing intact.  Neck: Supple. Thyroid nl. Car 2+/2+ without bruits, nodes or JVD. Chest: Respirations nl with BS clear & equal w/o rales, rhonchi, wheezing or stridor.  Cor: Heart sounds normal w/ regular rate and  rhythm without sig. murmurs, gallops, clicks, or rubs. Peripheral pulses normal and equal  without edema.  Abdomen: Soft & bowel sounds normal. Non-tender w/o guarding, rebound, hernias, masses, or organomegaly.  Lymphatics: Unremarkable.  Musculoskeletal: Full ROM all peripheral extremities, joint stability, 5/5 strength, and normal gait.  Skin: Warm, dry without exposed rashes, lesions or ecchymosis apparent.  Neuro: Cranial nerves intact, reflexes equal bilaterally. Sensory-motor testing grossly intact. Tendon reflexes grossly intact.  Pysch: Alert & oriented x 3.  Insight and judgement nl & appropriate. No ideations.  Assessment and Plan:  1. Hypertension - Continue monitor blood pressure at home. Continue diet/meds same.  2. Hyperlipidemia - Continue diet/meds, exercise,& lifestyle modifications. Continue monitor periodic cholesterol/liver & renal functions   3. 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 Monitor appropriate labs.  4. Vitamin D Deficiency - Continue supplementation.   Recommended regular exercise, BP monitoring,  weight control, and discussed med and SE's. Recommended labs to assess and monitor clinical status. Further disposition pending results of labs.

## 2014-02-25 NOTE — Patient Instructions (Signed)

## 2014-02-26 LAB — BASIC METABOLIC PANEL WITHOUT GFR
BUN: 19 mg/dL (ref 6–23)
CO2: 28 meq/L (ref 19–32)
Calcium: 9.4 mg/dL (ref 8.4–10.5)
Chloride: 102 meq/L (ref 96–112)
Creat: 1.2 mg/dL (ref 0.50–1.35)
GFR, Est African American: 73 mL/min
GFR, Est Non African American: 63 mL/min
Glucose, Bld: 121 mg/dL — ABNORMAL HIGH (ref 70–99)
Potassium: 4.7 meq/L (ref 3.5–5.3)
Sodium: 138 meq/L (ref 135–145)

## 2014-02-26 LAB — HEPATIC FUNCTION PANEL
ALT: 26 U/L (ref 0–53)
AST: 27 U/L (ref 0–37)
Albumin: 4.3 g/dL (ref 3.5–5.2)
Alkaline Phosphatase: 82 U/L (ref 39–117)
Bilirubin, Direct: 0.2 mg/dL (ref 0.0–0.3)
Indirect Bilirubin: 1 mg/dL (ref 0.2–1.2)
Total Bilirubin: 1.2 mg/dL (ref 0.2–1.2)
Total Protein: 6.6 g/dL (ref 6.0–8.3)

## 2014-02-26 LAB — CBC WITH DIFFERENTIAL/PLATELET
Basophils Absolute: 0 K/uL (ref 0.0–0.1)
Basophils Relative: 1 % (ref 0–1)
Eosinophils Absolute: 0.1 K/uL (ref 0.0–0.7)
Eosinophils Relative: 2 % (ref 0–5)
HCT: 42.9 % (ref 39.0–52.0)
Hemoglobin: 14.3 g/dL (ref 13.0–17.0)
Lymphocytes Relative: 25 % (ref 12–46)
Lymphs Abs: 1 K/uL (ref 0.7–4.0)
MCH: 28.6 pg (ref 26.0–34.0)
MCHC: 33.3 g/dL (ref 30.0–36.0)
MCV: 85.8 fL (ref 78.0–100.0)
MPV: 9.1 fL — ABNORMAL LOW (ref 9.4–12.4)
Monocytes Absolute: 0.3 K/uL (ref 0.1–1.0)
Monocytes Relative: 8 % (ref 3–12)
Neutro Abs: 2.6 K/uL (ref 1.7–7.7)
Neutrophils Relative %: 64 % (ref 43–77)
Platelets: 132 K/uL — ABNORMAL LOW (ref 150–400)
RBC: 5 MIL/uL (ref 4.22–5.81)
RDW: 13.4 % (ref 11.5–15.5)
WBC: 4.1 K/uL (ref 4.0–10.5)

## 2014-02-26 LAB — MAGNESIUM: Magnesium: 1.5 mg/dL (ref 1.5–2.5)

## 2014-02-26 LAB — LIPID PANEL
Cholesterol: 158 mg/dL (ref 0–200)
HDL: 50 mg/dL
LDL Cholesterol: 91 mg/dL (ref 0–99)
Total CHOL/HDL Ratio: 3.2 ratio
Triglycerides: 87 mg/dL
VLDL: 17 mg/dL (ref 0–40)

## 2014-02-26 LAB — TSH: TSH: 1.234 u[IU]/mL (ref 0.350–4.500)

## 2014-02-26 LAB — HEMOGLOBIN A1C
HEMOGLOBIN A1C: 6.1 % — AB (ref <5.7)
MEAN PLASMA GLUCOSE: 128 mg/dL — AB (ref <117)

## 2014-02-26 LAB — PROTIME-INR
INR: 2.67 — ABNORMAL HIGH
Prothrombin Time: 28.4 s — ABNORMAL HIGH (ref 11.6–15.2)

## 2014-02-26 LAB — VITAMIN D 25 HYDROXY (VIT D DEFICIENCY, FRACTURES): Vit D, 25-Hydroxy: 91 ng/mL (ref 30–100)

## 2014-03-15 ENCOUNTER — Encounter: Payer: Self-pay | Admitting: Internal Medicine

## 2014-03-15 MED ORDER — BETAMETHASONE DIPROPIONATE 0.05 % EX CREA: TOPICAL_CREAM | Freq: Two times a day (BID) | CUTANEOUS | Status: DC

## 2014-03-20 ENCOUNTER — Other Ambulatory Visit: Payer: Self-pay | Admitting: Physician Assistant

## 2014-04-02 ENCOUNTER — Encounter: Payer: Self-pay | Admitting: *Deleted

## 2014-04-07 ENCOUNTER — Other Ambulatory Visit: Payer: Self-pay | Admitting: Internal Medicine

## 2014-04-07 ENCOUNTER — Encounter: Payer: Self-pay | Admitting: Internal Medicine

## 2014-04-18 ENCOUNTER — Ambulatory Visit (INDEPENDENT_AMBULATORY_CARE_PROVIDER_SITE_OTHER): Payer: Medicare Other | Admitting: Physician Assistant

## 2014-04-18 VITALS — BP 140/72 | HR 60 | Temp 97.9°F | Resp 16 | Wt 202.0 lb

## 2014-04-18 DIAGNOSIS — R0600 Dyspnea, unspecified: Secondary | ICD-10-CM

## 2014-04-18 DIAGNOSIS — I480 Paroxysmal atrial fibrillation: Secondary | ICD-10-CM

## 2014-04-18 DIAGNOSIS — Z7901 Long term (current) use of anticoagulants: Secondary | ICD-10-CM

## 2014-04-18 LAB — PROTIME-INR
INR: 2.92 — ABNORMAL HIGH (ref <1.50)
Prothrombin Time: 30.5 seconds — ABNORMAL HIGH (ref 11.6–15.2)

## 2014-04-18 NOTE — Progress Notes (Signed)
Coumadin follow up  Patient is on Coumadin for Primary Diagnosis: PAF (paroxysmal atrial fibrillation) [I48.0] Patient's last INR is  Lab Results  Component Value Date   INR 2.67* 02/25/2014   INR 2.38* 01/26/2014   INR 2.20* 12/27/2013    Patient denies SOB, CP, dizziness, nose bleeds, easy bleeding, and blood in stool/urine. His coumadin dose was not changed last visit, he is on 5 mg M, W,F, and 7.5 the rest of the days. He has not taken ABX, has not missed any doses and denies a fall.   He states that intermittently he will have dyspnea 20 mins after lying down for last month, will wake himself up short of breath suddenly, last episode a week ago, denies any other accompaniments like CP, cough, diaphoresis, palpitations. Better almost immediately with standing. He denies orthopnea, edema, weight gain. He is planning on joining [employer] when he leaves here, no recent exercise. Has had negative sleep study test 2-3 years ago. Last Echo 02/2013, EF 50%.  Wt Readings from Last 3 Encounters:  04/18/14 202 lb (91.627 kg)  02/25/14 195 lb (88.451 kg)  01/26/14 196 lb (88.905 kg)     Current Outpatient Prescriptions on File Prior to Visit  Medication Sig Dispense Refill  . betamethasone dipropionate (DIPROLENE) 0.05 % cream Apply topically 2 (two) times daily. 45 g 5  . buPROPion (WELLBUTRIN XL) 300 MG 24 hr tablet TAKE 1 TABLET BY MOUTH EVERY MORNING. 30 tablet 2  . Cholecalciferol (VITAMIN D PO) Take 10,000 Int'l Units by mouth daily.     . finasteride (PROSCAR) 5 MG tablet Take 1 tablet (5 mg total) by mouth daily. 90 tablet 1  . FREESTYLE LITE test strip     . glucose blood (FREESTYLE TEST STRIPS) test strip CHECKS BS tid-qid FOR MEDICATION ADJUSTMENT, DX- 250.01 NPI [id number] 450 each PRN  . hydrOXYzine (ATARAX/VISTARIL) 25 MG tablet TAKE 1 OR 2 TABLETS BY MOUTH 2 TO 4 TIMES DAILY AS NEEDED FOR ANXIETY 120 tablet 99  . hydrOXYzine (VISTARIL) 25 MG capsule   99  . insulin  NPH-regular Human (NOVOLIN 70/30) (70-30) 100 UNIT/ML injection Take 20-30 units 2 x day or as directed 10 mL 99  . Insulin Pen Needle 31G X 5 MM MISC BD ultra fine pen needles.  Patient checks glucose 2 times daily.  DX-250.01 50 each 11  . lisdexamfetamine (VYVANSE) 60 MG capsule Take by mouth.    . Magnesium 500 MG CAPS Take 1,000 mg by mouth daily.    . mycophenolate (MYFORTIC) 360 MG TBEC EC tablet Take by mouth.    Marland Kitchen MYFORTIC 360 MG TBEC 360 mg 2 (two) times daily.     . pravastatin (PRAVACHOL) 40 MG tablet TAKE 1 TABLET BY MOUTH DAILY 90 tablet 3  . sulfamethoxazole-trimethoprim (BACTRIM,SEPTRA) 400-80 MG per tablet Take 1 tablet by mouth every Monday, Wednesday, and Friday.     . tacrolimus (PROGRAF) 0.5 MG capsule   11  . tacrolimus (PROGRAF) 1 MG capsule   2  . TRAVATAN Z 0.004 % SOLN ophthalmic solution Place 1 drop into both eyes at bedtime.     Marland Kitchen warfarin (COUMADIN) 5 MG tablet Take 5 mg by mouth daily. Takes 5 mg on M, W,F and 7.5 mg the other 4 days.     No current facility-administered medications on file prior to visit.   Past Medical History  Diagnosis Date  . Diabetes mellitus type 2, controlled   . Hyperlipidemia   . BPH (benign  prostatic hyperplasia)   . Gout   . CAD (coronary artery disease)     s/p post-op NSTEMI. treated with stent to LAD. at [facility]  . PAF (paroxysmal atrial fibrillation)     s/p DC-CV in 6/13. Off coumadin due to ureteral bleed  . OSA (obstructive sleep apnea)   . 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 HTN (hypertension)   . MI (myocardial infarction) 09/26/2011  . 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 Crestor [Rosuvastatin]     Elevated LFT's  . Lorazepam   . Losartan     ROS Constitutional: Denies fever, chills, headaches, fatigue. Cardio: + PND Denies chest pain, palpitations, irregular heartbeat, syncope, dyspnea,  diaphoresis, orthopnea,  claudication, edema Respiratory:  denies cough, dyspnea, DOE, pleurisy, hoarseness, laryngitis, wheezing.  Gastrointestinal: Denies dysphagia, heartburn, reflux, pain, cramps, nausea, diarrhea, constipation, hematemesis, melena, hematochezia Genitourinary: Denies dysuria, frequency, hematuria, flank pain Musculoskeletal: Denies arthralgia, myalgia, stiffness, Jt. Swelling, pain, limp, strain/sprain. Skin: Denies rash, ecchymosis, petechial. Neuro: Denies Weakness, tremor, incoordination, spasms, paresthesia, pain Heme/Lymph: Denies Excessive bleeding, bruising, enlarged lymph nodes  Physical: Blood pressure 140/72, pulse 60, temperature 97.9 F (36.6 C), resp. rate 16, weight 202 lb (91.627 kg). Filed Weights   04/18/14 1337  Weight: 202 lb (91.627 kg)    General Appearance: Well nourished, in no apparent distress. ENT/Mouth: Nares clear with no erythema, swelling, mucus on turbinates. No ulcers, cracking, on lips. No erythema, swelling, or exudate on post pharynx.  Neck: Supple, thyroid normal.  Respiratory: CTAB, O2 98% RA   Cardio: NSR with PVCs, holosystolic murmur no, rubs or gallops. 1-2 + edema  Abdomen: Soft, with bowl sounds. Non tender, no guarding, rebound, hernias, masses, or organomegaly.  Skin: Warm, dry without rashes, lesion. + ecchymosis bilateral arms.  Neuro: Unremarkable  Assessment and Plan: Chronic anticoagulation- check INR and will adjust medication according to labs.  Discussed if patient falls to immediately contact office or go to ER. Discussed foods that can increase or decrease Coumadin levels. Patient understands to call the office before starting a new medication. Follow up in one month.   PND- ? From fluid over load, mild edema and weight is up,  seeing nephrology tomorrow, CHF- If any increasing shortness of breath, swelling, or chest pressure go to ER immediately. Decrease your sodium intake to less than 2000 mg daily, decrease  your fluid intake to less than 2 L daily, elevate legs, weigh yourself daily, call the office if 5 lbs weight loss OR gain in a day.  Follow up in 1 month

## 2014-04-18 NOTE — Patient Instructions (Signed)
Do the following things EVERYDAY: 1) Weigh yourself in the morning before breakfast. Write it down and keep it in a log. 2) Take your medicines as prescribed 3) Eat low salt foods-Limit salt (sodium) to 2000 mg per day.  4) Stay as active as you can everyday 5) Limit all fluids for the day to less than 2 liters  Heart Failure Heart failure means your heart has trouble pumping blood. This makes it hard for your body to work well. Heart failure is usually a long-term (chronic) condition. You must take good care of yourself and follow your doctor's treatment plan. HOME CARE  Take your heart medicine as told by your doctor.  Do not stop taking medicine unless your doctor tells you to.  Do not skip any dose of medicine.  Refill your medicines before they run out.  Take other medicines only as told by your doctor or pharmacist.  Stay active if told by your doctor. The elderly and people with severe heart failure should talk with a doctor about physical activity.  Eat heart-healthy foods. Choose foods that are without trans fat and are low in saturated fat, cholesterol, and salt (sodium). This includes fresh or frozen fruits and vegetables, fish, lean meats, fat-free or low-fat dairy foods, whole grains, and high-fiber foods. Lentils and dried peas and beans (legumes) are also good choices.  Limit salt if told by your doctor.  Cook in a healthy way. Roast, grill, broil, bake, poach, steam, or stir-fry foods.  Limit fluids as told by your doctor.  Weigh yourself every morning. Do this after you pee (urinate) and before you eat breakfast. Write down your weight to give to your doctor.  Take your blood pressure and write it down if your doctor tells you to.  Ask your doctor how to check your pulse. Check your pulse as told.  Lose weight if told by your doctor.  Stop smoking or chewing tobacco. Do not use gum or patches that help you quit without your doctor's approval.  Schedule and go  to doctor visits as told.  Nonpregnant women should have no more than 1 drink a day. Men should have no more than 2 drinks a day. Talk to your doctor about drinking alcohol.  Stop illegal drug use.  Stay current with shots (immunizations).  Manage your health conditions as told by your doctor.  Learn to manage your stress.  Rest when you are tired.  If it is really hot outside:  Avoid intense activities.  Use air conditioning or fans, or get in a cooler place.  Avoid caffeine and alcohol.  Wear loose-fitting, lightweight, and light-colored clothing.  If it is really cold outside:  Avoid intense activities.  Layer your clothing.  Wear mittens or gloves, a hat, and a scarf when going outside.  Avoid alcohol.  Learn about heart failure and get support as needed.  Get help to maintain or improve your quality of life and your ability to care for yourself as needed. GET HELP IF:   You gain 03 lb/1.4 kg or more in 1 day or 05 lb/2.3 kg in a week.  You are more short of breath than usual.  You cannot do your normal activities.  You tire easily.  You cough more than normal, especially with activity.  You have any or more puffiness (swelling) in areas such as your hands, feet, ankles, or belly (abdomen).  You cannot sleep because it is hard to breathe.  You feel like your heart  is beating fast (palpitations).  You get dizzy or light-headed when you stand up. GET HELP RIGHT AWAY IF:   You have trouble breathing.  There is a change in mental status, such as becoming less alert or not being able to focus.  You have chest pain or discomfort.  You faint. MAKE SURE YOU:   Understand these instructions.  Will watch your condition.  Will get help right away if you are not doing well or get worse. Document Released: 01/02/2008 Document Revised: 08/09/2013 Document Reviewed: 05/11/2012 [facility] Patient Information 2015 [place], [place]. This information is not  intended to replace advice given to you by your health care provider. Make sure you discuss any questions you have with your health care provider.

## 2014-05-18 ENCOUNTER — Other Ambulatory Visit: Payer: Self-pay | Admitting: Physician Assistant

## 2014-05-23 ENCOUNTER — Ambulatory Visit: Payer: Self-pay | Admitting: Physician Assistant

## 2014-06-22 ENCOUNTER — Other Ambulatory Visit: Payer: Self-pay | Admitting: Physician Assistant

## 2014-06-29 ENCOUNTER — Ambulatory Visit (INDEPENDENT_AMBULATORY_CARE_PROVIDER_SITE_OTHER): Payer: Medicare Other | Admitting: Internal Medicine

## 2014-06-29 ENCOUNTER — Encounter: Payer: Self-pay | Admitting: Internal Medicine

## 2014-06-29 VITALS — BP 126/76 | HR 52 | Temp 97.3°F | Resp 16 | Ht 67.75 in | Wt 194.8 lb

## 2014-06-29 DIAGNOSIS — F329 Major depressive disorder, single episode, unspecified: Secondary | ICD-10-CM

## 2014-06-29 DIAGNOSIS — F32A Depression, unspecified: Secondary | ICD-10-CM

## 2014-06-29 DIAGNOSIS — Z79899 Other long term (current) drug therapy: Secondary | ICD-10-CM

## 2014-06-29 DIAGNOSIS — E1022 Type 1 diabetes mellitus with diabetic chronic kidney disease: Secondary | ICD-10-CM

## 2014-06-29 DIAGNOSIS — M109 Gout, unspecified: Secondary | ICD-10-CM

## 2014-06-29 DIAGNOSIS — E559 Vitamin D deficiency, unspecified: Secondary | ICD-10-CM

## 2014-06-29 DIAGNOSIS — I151 Hypertension secondary to other renal disorders: Secondary | ICD-10-CM

## 2014-06-29 DIAGNOSIS — F325 Major depressive disorder, single episode, in full remission: Secondary | ICD-10-CM | POA: Insufficient documentation

## 2014-06-29 DIAGNOSIS — N2889 Other specified disorders of kidney and ureter: Secondary | ICD-10-CM

## 2014-06-29 DIAGNOSIS — E785 Hyperlipidemia, unspecified: Secondary | ICD-10-CM

## 2014-06-29 DIAGNOSIS — Z7901 Long term (current) use of anticoagulants: Secondary | ICD-10-CM

## 2014-06-29 LAB — HEPATIC FUNCTION PANEL
ALT: 18 U/L (ref 0–53)
AST: 22 U/L (ref 0–37)
Albumin: 4.2 g/dL (ref 3.5–5.2)
Alkaline Phosphatase: 86 U/L (ref 39–117)
BILIRUBIN DIRECT: 0.2 mg/dL (ref 0.0–0.3)
BILIRUBIN INDIRECT: 0.7 mg/dL (ref 0.2–1.2)
Total Bilirubin: 0.9 mg/dL (ref 0.2–1.2)
Total Protein: 6.5 g/dL (ref 6.0–8.3)

## 2014-06-29 LAB — BASIC METABOLIC PANEL WITH GFR
BUN: 20 mg/dL (ref 6–23)
CALCIUM: 9.4 mg/dL (ref 8.4–10.5)
CHLORIDE: 104 meq/L (ref 96–112)
CO2: 26 meq/L (ref 19–32)
Creat: 1.08 mg/dL (ref 0.50–1.35)
GFR, EST NON AFRICAN AMERICAN: 71 mL/min
GFR, Est African American: 82 mL/min
Glucose, Bld: 81 mg/dL (ref 70–99)
Potassium: 4.3 mEq/L (ref 3.5–5.3)
SODIUM: 139 meq/L (ref 135–145)

## 2014-06-29 LAB — LIPID PANEL
CHOLESTEROL: 140 mg/dL (ref 0–200)
HDL: 49 mg/dL (ref >=40)
LDL Cholesterol: 77 mg/dL (ref 0–99)
Total CHOL/HDL Ratio: 2.9 Ratio
Triglycerides: 69 mg/dL (ref <150)
VLDL: 14 mg/dL (ref 0–40)

## 2014-06-29 LAB — CBC WITH DIFFERENTIAL/PLATELET
BASOS PCT: 1 % (ref 0–1)
Basophils Absolute: 0 10*3/uL (ref 0.0–0.1)
EOS PCT: 2 % (ref 0–5)
Eosinophils Absolute: 0.1 10*3/uL (ref 0.0–0.7)
HCT: 42.6 % (ref 39.0–52.0)
HEMOGLOBIN: 14 g/dL (ref 13.0–17.0)
LYMPHS ABS: 1.2 10*3/uL (ref 0.7–4.0)
Lymphocytes Relative: 31 % (ref 12–46)
MCH: 28.7 pg (ref 26.0–34.0)
MCHC: 32.9 g/dL (ref 30.0–36.0)
MCV: 87.5 fL (ref 78.0–100.0)
MONOS PCT: 9 % (ref 3–12)
MPV: 9.6 fL (ref 8.6–12.4)
Monocytes Absolute: 0.4 10*3/uL (ref 0.1–1.0)
Neutro Abs: 2.3 10*3/uL (ref 1.7–7.7)
Neutrophils Relative %: 57 % (ref 43–77)
Platelets: 132 10*3/uL — ABNORMAL LOW (ref 150–400)
RBC: 4.87 MIL/uL (ref 4.22–5.81)
RDW: 13.6 % (ref 11.5–15.5)
WBC: 4 10*3/uL (ref 4.0–10.5)

## 2014-06-29 LAB — URIC ACID: URIC ACID, SERUM: 6.5 mg/dL (ref 4.0–7.8)

## 2014-06-29 LAB — TSH: TSH: 1.934 u[IU]/mL (ref 0.350–4.500)

## 2014-06-29 LAB — MAGNESIUM: Magnesium: 1.5 mg/dL (ref 1.5–2.5)

## 2014-06-29 NOTE — Progress Notes (Signed)
Patient ID: Robert Arnold, male   DOB: Jan 25, 1946, 69 y.o.   MRN: [id number]   This very nice 69 y.o. 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 Then later in Nov 2013 he underwent CABG and has done well since. Patient did have pAfib and has been on coumadin since.  BP has been controlled at home. Today's BP: 126/76 mmHg. Patient has had no complaints of any cardiac type chest pain, palpitations, dyspnea/orthopnea/PND, dizziness, claudication, or dependent edema.   Hyperlipidemia is controlled with diet & meds. Patient denies myalgias or other med SE's. Last Lipids were at goal - Total  Chol 140; HDL 49; LDL  77; Trig 69 on 06/29/2014   Also, the patient has history of T1_IDDM since 2003 and has had no symptoms of reactive hypoglycemia, diabetic polys, paresthesias or visual blurring.  Last A1c was  6.1% on 02/25/2014.    Further, the patient also has history of Vitamin D Deficiency of 24 inn Mar 2014and supplements vitamin D without any suspected side-effects. Last vitamin D was  91 on  02/25/2014.  Medication Sig  . betamethasone dipro (DIPROLENE) 0.05 % cream Apply topically 2 (two) times daily.  Marland Kitchen buPROPion (WELLBUTRIN XL) 300 MG 24 hr tablet TAKE 1 TABLET BY MOUTH EVERY MORNING.  Marland Kitchen Cholecalciferol (VITAMIN D PO) Take 10,000 Int'l Units by mouth daily.   . finasteride (PROSCAR) 5 MG tablet Take 1 tablet (5 mg total) by mouth daily.  . hydrOXYzine (ATARAX/VISTARIL) 25 MG tablet TAKE 1 OR 2 TABLETS BY MOUTH 2 TO 4 TIMES DAILY AS NEEDED FOR ANXIETY  . hydrOXYzine (VISTARIL) 25 MG capsule   . NOVOLIN 70/30 injection Take 20-30 units 2 x day or as directed  . Insulin Pen Needle  31G X 5 MM MISC BD ultra fine pen needles.  Patient checks glucose 2 times daily.  DX-250.01  . Magnesium 500 MG CAPS Take 1,000 mg by mouth daily.  . mycophenolate (MYFORTIC) 360 MG TBEC EC tablet Take by mouth.  Marland Kitchen MYFORTIC 360 MG TBEC Take 360 mg by mouth 2 (two) times daily.   . pravastatin (PRAVACHOL) 40 MG tablet TAKE 1 TABLET BY MOUTH DAILY  . SEPTRA 400-80 MG per tablet Take 1 tablet by mouth every Monday, Wednesday, and Friday.   . tacrolimus (PROGRAF) 0.5 MG capsule Take 0.5 mg by mouth 2 (two) times daily.   . tacrolimus (PROGRAF) 1 MG capsule Take 1 mg by mouth 2 (two) times daily.   . TRAVATAN Z 0.004 % SOLN ophth soln Place 1 drop into both eyes at bedtime.   Marland Kitchen warfarin  5 MG tablet Take 5 mg by mouth daily. Takes 5 mg on M, W,F and 7.5 mg the other 4 days.   Allergies  Allergen Reactions  . Crestor [Rosuvastatin]     Elevated LFT's  . Lorazepam   . Losartan    PMHx:   Past Medical History  Diagnosis Date  . Diabetes mellitus type 2, controlled   . Hyperlipidemia   . BPH (benign prostatic hyperplasia)   . Gout   . CAD (coronary artery disease)     s/p post-op NSTEMI.  treated with stent to LAD. at [facility]  . PAF (paroxysmal atrial fibrillation)     s/p DC-CV in 6/13. Off coumadin due to ureteral bleed  . OSA (obstructive sleep apnea)   . 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 HTN (hypertension)   . MI (myocardial infarction) 09/26/2011  . 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 DTaP 04/08/2008  . Hepatitis B 04/08/2009  . Influenza, High Dose Seasonal PF 12/27/2013  . Pneumococcal Conjugate-13 10/25/2013  . Pneumococcal-Unspecified 04/08/2008   Past Surgical History  Procedure Laterality Date  . Colonoscopy    . Peritoneal catheter insertion  2011  . Av fistula placement  2011    Left forearm  . Kidney transplant  09/25/2011   . Coronary angioplasty with stent placement     FHx:    Reviewed / unchanged  SHx:    Reviewed / unchanged  Systems Review:  Constitutional: Denies fever, chills, wt changes, headaches, insomnia, fatigue, night sweats, change in appetite. Eyes: Denies redness, blurred vision, diplopia, discharge, itchy, watery eyes.  ENT: Denies discharge, congestion, post nasal drip, epistaxis, sore throat, earache, hearing loss, dental pain, tinnitus, vertigo, sinus pain, snoring.  CV: Denies chest pain, palpitations, irregular heartbeat, syncope, dyspnea, diaphoresis, orthopnea, PND, claudication or edema. Respiratory: denies cough, dyspnea, DOE, pleurisy, hoarseness, laryngitis, wheezing.  Gastrointestinal: Denies dysphagia, odynophagia, heartburn, reflux, water brash, abdominal pain or cramps, nausea, vomiting, bloating, diarrhea, constipation, hematemesis, melena, hematochezia  or hemorrhoids. Genitourinary: Denies dysuria, frequency, urgency, nocturia, hesitancy, discharge, hematuria or flank pain. Musculoskeletal: Denies arthralgias, myalgias, stiffness, jt. swelling, pain, limping or strain/sprain.  Skin: Denies pruritus, rash, hives, warts, acne, eczema or change in skin lesion(s). Neuro: No weakness, tremor, incoordination, spasms, paresthesia or pain. Psychiatric: Denies confusion, memory loss or sensory loss. Endo: Denies change in weight, skin or hair change.  Heme/Lymph: No excessive bleeding, bruising or enlarged lymph nodes.  Physical Exam  BP 126/76  Pulse 52  Temp 97.3 F   Resp 16  Ht 5' 7.75"   Wt 194 lb 12.8 oz   BMI 29.83   Appears well nourished and in no distress. Eyes: PERRLA, EOMs, conjunctiva no swelling or erythema. Sinuses: No frontal/maxillary tenderness ENT/Mouth: EAC's clear, TM's nl w/o erythema, bulging. Nares clear w/o erythema, swelling, exudates. Oropharynx clear without erythema or exudates. Oral hygiene is good. Tongue normal, non obstructing. Hearing  intact.  Neck: Supple. Thyroid nl. Car 2+/2+ without bruits, nodes or JVD. Chest: Respirations nl with BS clear & equal w/o rales, rhonchi, wheezing or stridor.  Cor: Heart sounds normal w/ regular rate and rhythm without sig. murmurs, gallops, clicks, or rubs. Peripheral pulses normal and equal  without edema.  Abdomen: Soft & bowel sounds normal. Non-tender w/o guarding, rebound, hernias, masses, or organomegaly.  Lymphatics: Unremarkable.  Musculoskeletal: Full ROM all peripheral extremities, joint stability, 5/5 strength, and normal gait.  Skin: Warm, dry without exposed rashes, lesions or ecchymosis apparent.  Neuro: Cranial nerves intact, reflexes equal bilaterally. Sensory-motor testing grossly intact. Tendon reflexes grossly intact.  Pysch: Alert & oriented x 3.  Insight and judgement nl & appropriate. No ideations.  Assessment and Plan:   1. Hypertension secondary to other renal disorders   2. Hyperlipidemia  - Lipid panel - TSH  3. Type 1 diabetes mellitus with diabetic chronic kidney disease  - Hemoglobin A1c  4. Vitamin D deficiency  -  Vit D  25 hydroxy (rtn osteoporosis monitoring)  5. Gout, unspecified cause, unspecified chronicity, unspecified site  - Uric acid  6. Depression, controlled   7. Long term current use of anticoagulant therapy  - Protime-INR  8. Medication management  - CBC with Differential/Platelet - BASIC METABOLIC PANEL WITH GFR - Hepatic function panel - Magnesium   Recommended regular exercise, BP monitoring, weight control, and discussed med and SE's. Recommended labs to assess and monitor clinical status. Further disposition pending results of labs. Over 30 minutes of exam, counseling, chart review was performed

## 2014-06-29 NOTE — Patient Instructions (Signed)

## 2014-06-30 LAB — HEMOGLOBIN A1C
Hgb A1c MFr Bld: 6.1 % — ABNORMAL HIGH (ref <5.7)
Mean Plasma Glucose: 128 mg/dL — ABNORMAL HIGH (ref <117)

## 2014-06-30 LAB — PROTIME-INR
INR: 2.88 — AB (ref <1.50)
PROTHROMBIN TIME: 30.2 s — AB (ref 11.6–15.2)

## 2014-06-30 LAB — VITAMIN D 25 HYDROXY (VIT D DEFICIENCY, FRACTURES): VIT D 25 HYDROXY: 51 ng/mL (ref 30–100)

## 2014-07-01 ENCOUNTER — Ambulatory Visit: Payer: Self-pay | Admitting: Internal Medicine

## 2014-07-01 ENCOUNTER — Other Ambulatory Visit: Payer: Self-pay | Admitting: Internal Medicine

## 2014-08-02 ENCOUNTER — Ambulatory Visit (INDEPENDENT_AMBULATORY_CARE_PROVIDER_SITE_OTHER): Payer: Medicare Other | Admitting: Physician Assistant

## 2014-08-02 ENCOUNTER — Encounter: Payer: Self-pay | Admitting: Physician Assistant

## 2014-08-02 VITALS — BP 112/62 | HR 52 | Temp 97.7°F | Resp 16 | Ht 67.0 in | Wt 198.0 lb

## 2014-08-02 DIAGNOSIS — Z7901 Long term (current) use of anticoagulants: Secondary | ICD-10-CM

## 2014-08-02 DIAGNOSIS — I480 Paroxysmal atrial fibrillation: Secondary | ICD-10-CM

## 2014-08-02 NOTE — Progress Notes (Signed)
Coumadin follow up  Patient is on Coumadin for Primary Diagnosis: PAF (paroxysmal atrial fibrillation) [I48.0] Patient's last INR is  Lab Results  Component Value Date   INR 2.88* 06/29/2014   INR 2.92* 04/18/2014   INR 2.67* 02/25/2014    Patient denies SOB, CP, dizziness, nose bleeds, easy bleeding, and blood in stool/urine. He is on 5mg  M,W,F and 7.5mg  the rest.  His coumadin dose was not changed last visit. He has not taken ABX, has not missed any doses and denies a fall.   BMI is Body mass index is 31 kg/(m^2)., he is working on diet and exercise. Wt Readings from Last 3 Encounters:  08/02/14 198 lb (89.812 kg)  06/29/14 194 lb 12.8 oz (88.361 kg)  04/18/14 202 lb (91.627 kg)     Current Outpatient Prescriptions on File Prior to Visit  Medication Sig Dispense Refill  . atenolol (TENORMIN) 25 MG tablet   12  . betamethasone dipropionate (DIPROLENE) 0.05 % cream Apply topically 2 (two) times daily. 45 g 5  . buPROPion (WELLBUTRIN XL) 300 MG 24 hr tablet TAKE 1 TABLET BY MOUTH EVERY MORNING. 90 tablet 1  . Cholecalciferol (VITAMIN D PO) Take 10,000 Int'l Units by mouth daily.     . finasteride (PROSCAR) 5 MG tablet Take 1 tablet (5 mg total) by mouth daily. 90 tablet 1  . FREESTYLE LITE test strip     . glucose blood (FREESTYLE TEST STRIPS) test strip CHECKS BS tid-qid FOR MEDICATION ADJUSTMENT, DX- 250.01 NPI [id number] 450 each PRN  . hydrOXYzine (ATARAX/VISTARIL) 25 MG tablet TAKE 1 OR 2 TABLETS BY MOUTH 2 TO 4 TIMES DAILY AS NEEDED FOR ANXIETY 120 tablet 99  . hydrOXYzine (VISTARIL) 25 MG capsule   99  . insulin NPH-regular Human (NOVOLIN 70/30) (70-30) 100 UNIT/ML injection Take 20-30 units 2 x day or as directed 10 mL 99  . Insulin Pen Needle 31G X 5 MM MISC BD ultra fine pen needles.  Patient checks glucose 2 times daily.  DX-250.01 50 each 11  . Magnesium 500 MG CAPS Take 1,000 mg by mouth daily.    . Mycophenolate Sodium (MYCOPHENOLIC ACID) XX123456 MG TBEC TAKE 1 TABLET BY  MOUTH TWICE DAILY 60 tablet 99  . pravastatin (PRAVACHOL) 40 MG tablet TAKE 1 TABLET BY MOUTH DAILY 90 tablet 3  . sulfamethoxazole-trimethoprim (BACTRIM,SEPTRA) 400-80 MG per tablet Take 1 tablet by mouth every Monday, Wednesday, and Friday.     . tacrolimus (PROGRAF) 0.5 MG capsule Take 0.5 mg by mouth 2 (two) times daily.   11  . tacrolimus (PROGRAF) 1 MG capsule Take 1 mg by mouth 2 (two) times daily.   2  . TRAVATAN Z 0.004 % SOLN ophthalmic solution Place 1 drop into both eyes at bedtime.     Marland Kitchen warfarin (COUMADIN) 5 MG tablet Take 5 mg by mouth daily. Takes 5 mg on M, W,F and 7.5 mg the other 4 days.     No current facility-administered medications on file prior to visit.   Past Medical History  Diagnosis Date  . Diabetes mellitus type 2, controlled   . Hyperlipidemia   . BPH (benign prostatic hyperplasia)   . Gout   . CAD (coronary artery disease)     s/p post-op NSTEMI. treated with stent to LAD. at [facility]  . PAF (paroxysmal atrial fibrillation)     s/p DC-CV in 6/13. Off coumadin due to ureteral bleed  . OSA (obstructive sleep apnea)   . Histoplasmosis  on itraconazole for prophylaxis  . HTN (hypertension)   . MI (myocardial infarction) 09/26/2011  . 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 Crestor [Rosuvastatin]     Elevated LFT's  . Lorazepam   . Losartan     ROS Constitutional: Denies fever, chills, headaches, fatigue. Cardio: Denies chest pain, palpitations, irregular heartbeat, syncope, dyspnea, diaphoresis, orthopnea, PND, claudication, edema Respiratory: chronic SOB denies cough, pleurisy, hoarseness, laryngitis, wheezing.  Gastrointestinal: Denies dysphagia, heartburn, reflux, pain, cramps, nausea, diarrhea, constipation, hematemesis, melena, hematochezia Genitourinary: Denies dysuria, frequency, hematuria, flank pain Musculoskeletal: Denies arthralgia, myalgia,  stiffness, Jt. Swelling, pain, limp, strain/sprain. Skin: Denies rash, ecchymosis, petechial. Neuro: Denies Weakness, tremor, incoordination, spasms, paresthesia, pain Heme/Lymph: Denies Excessive bleeding, bruising, enlarged lymph nodes  Physical: Blood pressure 112/62, pulse 52, temperature 97.7 F (36.5 C), resp. rate 16, height 5\' 7"  (1.702 m), weight 198 lb (89.812 kg). Filed Weights   08/02/14 0847  Weight: 198 lb (89.812 kg)    General Appearance: Well nourished, in no apparent distress. ENT/Mouth: Nares clear with no erythema, swelling, mucus on turbinates. No ulcers, cracking, on lips. No erythema, swelling, or exudate on post pharynx.  Neck: Supple, thyroid normal.  Respiratory: CTAB, O2 98% RA   Cardio: NSR with PVCs, holosystolic murmur no, rubs or gallops. 1+ edema. Thrill palpated left arm.  Abdomen: Soft, with bowl sounds. Non tender, no guarding, rebound, hernias, masses, or organomegaly.  Skin: Warm, dry without rashes, lesion. + ecchymosis bilateral arms.  Neuro: Unremarkable   Assessment and plan: Chronic anticoagulation- check INR, getting labs done for nephrology, will get INR there and send them to [place] and will adjust medication according to labs.  Discussed if patient falls to immediately contact office or go to ER. Discussed foods that can increase or decrease Coumadin levels. Patient understands to call the office before starting a new medication. Follow up in 4-6 weeks  Future Appointments Date Time Provider [place]  09/14/2014 8:45 AM [name], PA-C [facility] None  10/18/2014 8:45 AM [name], MD [facility] None  12/05/2014 10:00 AM [name], MD [facility] None

## 2014-08-26 ENCOUNTER — Other Ambulatory Visit: Payer: Self-pay | Admitting: Physician Assistant

## 2014-08-26 LAB — PROTIME-INR
INR: 4.6 — ABNORMAL HIGH (ref 0.8–1.2)
Prothrombin Time: 49.2 s — ABNORMAL HIGH (ref 9.1–12.0)

## 2014-08-28 ENCOUNTER — Encounter: Payer: Self-pay | Admitting: Physician Assistant

## 2014-09-01 ENCOUNTER — Other Ambulatory Visit: Payer: Self-pay | Admitting: Emergency Medicine

## 2014-09-14 ENCOUNTER — Encounter: Payer: Self-pay | Admitting: Internal Medicine

## 2014-09-14 ENCOUNTER — Ambulatory Visit (INDEPENDENT_AMBULATORY_CARE_PROVIDER_SITE_OTHER): Payer: Medicare Other | Admitting: Internal Medicine

## 2014-09-14 VITALS — BP 128/70 | HR 74 | Temp 98.2°F | Resp 16 | Ht 67.75 in | Wt 198.0 lb

## 2014-09-14 DIAGNOSIS — I480 Paroxysmal atrial fibrillation: Secondary | ICD-10-CM

## 2014-09-14 DIAGNOSIS — Z7901 Long term (current) use of anticoagulants: Secondary | ICD-10-CM

## 2014-09-14 NOTE — Progress Notes (Signed)
Patient ID: Robert Arnold, male   DOB: [birth year], 69 y.o.   MRN: [id number]  Assessment and Plan:    HPI 69 y.o.male presents for 1 month follow up of coumadin. Patient reports that they have been doing well.  male is taking their medication.  They are not having difficulty with their medications.  They report no adverse reactions.  No problems with bleeding in stools or urine, no nose bleeding, and no problems with missed doses or falling.    Past Medical History  Diagnosis Date  . Diabetes mellitus type 2, controlled   . Hyperlipidemia   . BPH (benign prostatic hyperplasia)   . Gout   . CAD (coronary artery disease)     s/p post-op NSTEMI. treated with stent to LAD. at [facility]  . PAF (paroxysmal atrial fibrillation)     s/p DC-CV in 6/13. Off coumadin due to ureteral bleed  . OSA (obstructive sleep apnea)   . 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 HTN (hypertension)   . MI (myocardial infarction) 09/26/2011  . Chronic kidney disease     due IgA nephropathy - s/p kidnet transplant 09/25/11     Allergies  Allergen Reactions  . Crestor [Rosuvastatin]     Elevated LFT's  . Lorazepam   . Losartan       Current Outpatient Prescriptions on File Prior to Visit  Medication Sig Dispense Refill  . betamethasone dipropionate (DIPROLENE) 0.05 % cream Apply topically 2 (two) times daily. 45 g 5  . buPROPion (WELLBUTRIN XL) 300 MG 24 hr tablet TAKE 1 TABLET BY MOUTH EVERY MORNING. 90 tablet 1  . Cholecalciferol (VITAMIN D PO) Take 10,000 Int'l Units by mouth daily.     . finasteride (PROSCAR) 5 MG tablet Take 1 tablet (5 mg total) by mouth daily. 90 tablet 1  . FREESTYLE TEST STRIPS test strip CHECK BLOOD SUGAR 3-4 TIMES A DAY FOR MEDICATION ADJUSTMENT 450 each PRN  . hydrOXYzine (ATARAX/VISTARIL) 25 MG tablet TAKE 1 OR 2 TABLETS BY MOUTH 2 TO 4 TIMES DAILY AS NEEDED FOR ANXIETY 120 tablet 99  . insulin NPH-regular Human (NOVOLIN 70/30) (70-30) 100  UNIT/ML injection Take 20-30 units 2 x day or as directed 10 mL 99  . Insulin Pen Needle 31G X 5 MM MISC BD ultra fine pen needles.  Patient checks glucose 2 times daily.  DX-250.01 50 each 11  . Magnesium 500 MG CAPS Take 1,000 mg by mouth daily.    . Mycophenolate Sodium (MYCOPHENOLIC ACID) XX123456 MG TBEC TAKE 1 TABLET BY MOUTH TWICE DAILY 60 tablet 99  . pravastatin (PRAVACHOL) 40 MG tablet TAKE 1 TABLET BY MOUTH DAILY 90 tablet 3  . sulfamethoxazole-trimethoprim (BACTRIM,SEPTRA) 400-80 MG per tablet Take 1 tablet by mouth every Monday, Wednesday, and Friday.     . tacrolimus (PROGRAF) 0.5 MG capsule Take 0.5 mg by mouth 2 (two) times daily.   11  . tacrolimus (PROGRAF) 1 MG capsule Take 1 mg by mouth 2 (two) times daily.   2  . TRAVATAN Z 0.004 % SOLN ophthalmic solution Place 1 drop into both eyes at bedtime.     Marland Kitchen warfarin (COUMADIN) 5 MG tablet Take 5 mg by mouth daily.      No current facility-administered medications on file prior to visit.    ROS: all negative except above.   Physical Exam: Filed Weights   09/14/14 0847  Weight: 198 lb (89.812 kg)   BP 128/70 mmHg  Pulse 74  Temp(Src) 98.2 F (36.8 C) (Temporal)  Resp 16  Ht 5' 7.75" (1.721 m)  Wt 198 lb (89.812 kg)  BMI 30.32 kg/m2 General Appearance: Well developed well nourished, non-toxic appearing in no apparent distress. Eyes: PERRLA, EOMs, conjunctiva w/ no swelling or erythema or discharge Sinuses: No Frontal/maxillary tenderness ENT/Mouth: Ear canals clear without swelling or erythema.  TM's normal bilaterally with no retractions, bulging, or loss of landmarks.   Neck: Supple, thyroid normal, no notable JVD  Respiratory: Respiratory effort normal, Clear breath sounds anteriorly and posteriorly bilaterally without rales, rhonchi, wheezing or stridor. No retractions or accessory muscle usage. Cardio: RRR with no RGs.  3/6 murmur heard best on LSB with palpable thrill of the right radial pulse Abdomen: Soft, + BS.   Non tender, no guarding, rebound, hernias, masses.  Musculoskeletal: Full ROM, 5/5 strength, normal gait.  Skin: Warm, dry without rashes  Neuro: Awake and oriented X 3, Cranial nerves intact. Normal muscle tone, no cerebellar symptoms. Sensation intact.  Psych: normal affect, Insight and Judgment appropriate.     FORCUCCI, Nianna Igo, PA-C 9:03 AM [facility]

## 2014-09-15 LAB — PROTIME-INR
INR: 2.39 — AB (ref <1.50)
PROTHROMBIN TIME: 26.1 s — AB (ref 11.6–15.2)

## 2014-10-05 ENCOUNTER — Other Ambulatory Visit: Payer: Self-pay | Admitting: Physician Assistant

## 2014-10-17 ENCOUNTER — Encounter: Payer: Self-pay | Admitting: Internal Medicine

## 2014-10-17 ENCOUNTER — Encounter: Payer: Self-pay | Admitting: *Deleted

## 2014-10-18 ENCOUNTER — Encounter: Payer: Self-pay | Admitting: Internal Medicine

## 2014-10-18 ENCOUNTER — Ambulatory Visit (INDEPENDENT_AMBULATORY_CARE_PROVIDER_SITE_OTHER): Payer: Medicare Other | Admitting: Internal Medicine

## 2014-10-18 VITALS — BP 138/66 | HR 60 | Temp 97.5°F | Resp 16 | Ht 67.75 in | Wt 199.2 lb

## 2014-10-18 DIAGNOSIS — Z6830 Body mass index (BMI) 30.0-30.9, adult: Secondary | ICD-10-CM

## 2014-10-18 DIAGNOSIS — I480 Paroxysmal atrial fibrillation: Secondary | ICD-10-CM

## 2014-10-18 DIAGNOSIS — R5383 Other fatigue: Secondary | ICD-10-CM

## 2014-10-18 DIAGNOSIS — E1022 Type 1 diabetes mellitus with diabetic chronic kidney disease: Secondary | ICD-10-CM

## 2014-10-18 DIAGNOSIS — E785 Hyperlipidemia, unspecified: Secondary | ICD-10-CM

## 2014-10-18 DIAGNOSIS — Z940 Kidney transplant status: Secondary | ICD-10-CM

## 2014-10-18 DIAGNOSIS — Z7901 Long term (current) use of anticoagulants: Secondary | ICD-10-CM

## 2014-10-18 DIAGNOSIS — M1000 Idiopathic gout, unspecified site: Secondary | ICD-10-CM

## 2014-10-18 DIAGNOSIS — E559 Vitamin D deficiency, unspecified: Secondary | ICD-10-CM

## 2014-10-18 DIAGNOSIS — Z79899 Other long term (current) drug therapy: Secondary | ICD-10-CM

## 2014-10-18 DIAGNOSIS — I150 Renovascular hypertension: Secondary | ICD-10-CM

## 2014-10-18 DIAGNOSIS — N189 Chronic kidney disease, unspecified: Secondary | ICD-10-CM

## 2014-10-18 NOTE — Progress Notes (Signed)
Patient ID: Robert Arnold, male   DOB: 1945-08-10, 69 y.o.   MRN: [id number]    This very nice 69 y.o. 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     Patient is treated for HTN since 1998 & BP has been controlled at home. Today's BP: 138/66 mmHg. Patient did have an acute MI after his renal transplant and had a PTCA/Stent and in Oct 2013 he underwent CABG. He also developed Afib post CABG and has been on Coumadin since. Patient has had no recent complaints of any cardiac type chest pain, palpitations, dyspnea/orthopnea/PND, dizziness, claudication, or dependent edema.    Hyperlipidemia is controlled with diet & meds. Patient denies myalgias or other med SE's. Last Lipids were Cholesterol 140; HDL 49; LDL 77; Triglycerides 69 on 06/29/2014.    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  Reports FBG's average ~ 90-110 mg%. Last A1c was 6.1% on 06/29/2014.     Further, the patient also has history of Vitamin D Deficiency of 24 in 2014 and supplements vitamin D without any suspected side-effects. Last vitamin D was  51 on 06/29/2014.  Medication Sig  . betamethasone dipropionate (DIPROLENE) 0.05 % cream Apply topically 2 (two) times daily.  Marland Kitchen buPROPion (WELLBUTRIN XL) 300 MG 24 hr tablet TAKE 1 TABLET BY MOUTH EVERY MORNING.  Marland Kitchen Cholecalciferol (VITAMIN D PO) Take 10,000 Int'l Units by mouth daily.   . finasteride (PROSCAR) 5 MG tablet TAKE 1 TABLET BY MOUTH DAILY  . FREESTYLE TEST STRIPS test strip CHECK BLOOD SUGAR 3-4 TIMES A DAY FOR MEDICATION ADJUSTMENT  . hydrOXYzine (ATARAX/VISTARIL) 25 MG tablet TAKE 1 OR 2 TABLETS BY MOUTH 2 TO 4 TIMES DAILY AS NEEDED FOR ANXIETY  . insulin NPH-regular Human (NOVOLIN 70/30) (70-30) 100 UNIT/ML  injection Take 20-30 units 2 x day or as directed  . Magnesium 500 MG CAPS Take 1,000 mg by mouth daily.  . Mycophenolate Sodium (MYCOPHENOLIC ACID) XX123456 MG TBEC TAKE 1 TABLET BY MOUTH TWICE DAILY  . pravastatin (PRAVACHOL) 40 MG tablet TAKE 1 TABLET BY MOUTH DAILY  . sulfamethoxazole-trimethoprim (BACTRIM,SEPTRA) 400-80 MG per tablet Take 1 tablet by mouth every Monday, Wednesday, and Friday.   . tacrolimus (PROGRAF) 0.5 MG capsule Take 0.5 mg by mouth 2 (two) times daily.   . tacrolimus (PROGRAF) 1 MG capsule Take 1 mg by mouth 2 (two) times daily.   . TRAVATAN Z 0.004 % SOLN ophthalmic solution Place 1 drop into both eyes at bedtime.   Marland Kitchen warfarin (COUMADIN) 5 MG tablet Take 5 mg by mouth daily.    Allergies  Allergen Reactions  . Crestor [Rosuvastatin]     Elevated LFT's  . Lorazepam   . Losartan    PMHx:   Past Medical History  Diagnosis Date  . Diabetes mellitus type 2, controlled   . Hyperlipidemia   . BPH (benign prostatic hyperplasia)   . Gout   . CAD (coronary artery disease)     s/p post-op NSTEMI. treated with stent to LAD. at [facility]  . PAF (paroxysmal atrial fibrillation)     s/p DC-CV in 6/13. Off coumadin due to ureteral bleed  . OSA (obstructive sleep apnea)   . 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  HTN (hypertension)   . MI (myocardial infarction) 09/26/2011  . 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 DTaP 04/08/2008  . Hepatitis B 04/08/2009  . Influenza, High Dose Seasonal PF 12/27/2013  . Pneumococcal Conjugate-13 10/25/2013  . Pneumococcal-Unspecified 04/08/2008   Past Surgical History  Procedure Laterality Date  . Colonoscopy    . Peritoneal catheter insertion  2011  . Av fistula placement  2011    Left forearm  . Kidney transplant  09/25/2011  . Coronary angioplasty with stent placement     FHx:    Reviewed /  unchanged  SHx:    Reviewed / unchanged  Systems Review:  Constitutional: Denies fever, chills, wt changes, headaches, insomnia, fatigue, night sweats, change in appetite. Eyes: Denies redness, blurred vision, diplopia, discharge, itchy, watery eyes.  ENT: Denies discharge, congestion, post nasal drip, epistaxis, sore throat, earache, hearing loss, dental pain, tinnitus, vertigo, sinus pain, snoring.  CV: Denies chest pain, palpitations, irregular heartbeat, syncope, dyspnea, diaphoresis, orthopnea, PND, claudication or edema. Respiratory: denies cough, dyspnea, DOE, pleurisy, hoarseness, laryngitis, wheezing.  Gastrointestinal: Denies dysphagia, odynophagia, heartburn, reflux, water brash, abdominal pain or cramps, nausea, vomiting, bloating, diarrhea, constipation, hematemesis, melena, hematochezia  or hemorrhoids. Genitourinary: Denies dysuria, frequency, urgency, nocturia, hesitancy, discharge, hematuria or flank pain. Musculoskeletal: Denies arthralgias, myalgias, stiffness, jt. swelling, pain, limping or strain/sprain.  Skin: Denies pruritus, rash, hives, warts, acne, eczema or change in skin lesion(s). Neuro: No weakness, tremor, incoordination, spasms, paresthesia or pain. Psychiatric: Denies confusion, memory loss or sensory loss. Endo: Denies change in weight, skin or hair change.  Heme/Lymph: No excessive bleeding, bruising or enlarged lymph nodes.  Physical Exam  BP 138/66   Pulse 60  Temp 97.5 F   Resp 16  Ht 5' 7.75"   Wt 199 lb 3.2 oz     BMI 30.51  Appears well nourished and in no distress. Eyes: PERRLA, EOMs, conjunctiva no swelling or erythema. Sinuses: No frontal/maxillary tenderness ENT/Mouth: EAC's clear, TM's nl w/o erythema, bulging. Nares clear w/o erythema, swelling, exudates. Oropharynx clear without erythema or exudates. Oral hygiene is good. Tongue normal, non obstructing. Hearing intact.  Neck: Supple. Thyroid nl. Car 2+/2+ without bruits, nodes or  JVD. Chest: Median Sternotomy scar. Respirations nl with BS clear & equal w/o rales, rhonchi, wheezing or stridor.  Cor: Heart sounds normal w/ regular rate and rhythm without sig. murmurs, gallops, clicks, or rubs. Peripheral pulses normal and equal  without edema. LUE AVF with thrill & bruit.  Abdomen: Soft & bowel sounds normal. Non-tender w/o guarding, rebound, hernias, masses, or organomegaly.  Lymphatics: Unremarkable.  Musculoskeletal: Full ROM all peripheral extremities, joint stability, 5/5 strength, and normal gait.  Skin: Warm, dry without exposed rashes, lesions or ecchymosis apparent.  Neuro: Cranial nerves intact, reflexes equal bilaterally. Sensory-motor testing grossly intact. Tendon reflexes grossly intact.  Pysch: Alert & oriented x 3.  Insight and judgement nl & appropriate. No ideations.  Assessment and Plan:  1. Renovascular hypertension   2. Hyperlipidemia  - Lipid panel  3. Type 1 diabetes mellitus with diabetic chronic kidney disease  - Hemoglobin A1c  4. Vitamin D deficiency  - Vit D  25 hydroxy (rtn osteoporosis monitoring)  5. PAF (paroxysmal atrial fibrillation)   6. Renal Transplant, s/p 09/2011   7. Idiopathic gout, unspecified chronicity, unspecified site  - Uric acid  8. Morbid obesity (BMI 30.32)   9. BMI  30.0-30.9,adult   10. Long term current use of anticoagulant therapy  - Protime-INR  11. Medication management  - CBC with Differential/Platelet - BASIC METABOLIC PANEL WITH GFR - Hepatic function panel - Magnesium  12. Other fatigue  - TSH    Recommended regular exercise, BP monitoring, weight control, and discussed med and SE's. Recommended labs to assess and monitor clinical status. Further disposition pending results of labs. Over 30 minutes of exam, counseling, chart review was performed

## 2014-10-18 NOTE — Patient Instructions (Signed)

## 2014-10-19 LAB — CBC WITH DIFFERENTIAL/PLATELET
Basophils Absolute: 0 10*3/uL (ref 0.0–0.1)
Basophils Relative: 1 % (ref 0–1)
EOS PCT: 3 % (ref 0–5)
Eosinophils Absolute: 0.1 10*3/uL (ref 0.0–0.7)
HEMATOCRIT: 43.1 % (ref 39.0–52.0)
Hemoglobin: 13.9 g/dL (ref 13.0–17.0)
LYMPHS PCT: 28 % (ref 12–46)
Lymphs Abs: 1.1 10*3/uL (ref 0.7–4.0)
MCH: 28.7 pg (ref 26.0–34.0)
MCHC: 32.3 g/dL (ref 30.0–36.0)
MCV: 89 fL (ref 78.0–100.0)
MONOS PCT: 9 % (ref 3–12)
MPV: 8.9 fL (ref 8.6–12.4)
Monocytes Absolute: 0.4 10*3/uL (ref 0.1–1.0)
Neutro Abs: 2.4 10*3/uL (ref 1.7–7.7)
Neutrophils Relative %: 59 % (ref 43–77)
Platelets: 114 10*3/uL — ABNORMAL LOW (ref 150–400)
RBC: 4.84 MIL/uL (ref 4.22–5.81)
RDW: 13.8 % (ref 11.5–15.5)
WBC: 4 10*3/uL (ref 4.0–10.5)

## 2014-10-19 LAB — BASIC METABOLIC PANEL WITH GFR
BUN: 15 mg/dL (ref 6–23)
CALCIUM: 9.5 mg/dL (ref 8.4–10.5)
CHLORIDE: 104 meq/L (ref 96–112)
CO2: 28 meq/L (ref 19–32)
Creat: 1.02 mg/dL (ref 0.50–1.35)
GFR, EST AFRICAN AMERICAN: 88 mL/min
GFR, Est Non African American: 76 mL/min
GLUCOSE: 136 mg/dL — AB (ref 70–99)
Potassium: 4.4 mEq/L (ref 3.5–5.3)
Sodium: 138 mEq/L (ref 135–145)

## 2014-10-19 LAB — MAGNESIUM: Magnesium: 1.5 mg/dL (ref 1.5–2.5)

## 2014-10-19 LAB — TSH: TSH: 1.574 u[IU]/mL (ref 0.350–4.500)

## 2014-10-19 LAB — HEPATIC FUNCTION PANEL
ALT: 20 U/L (ref 0–53)
AST: 23 U/L (ref 0–37)
Albumin: 4 g/dL (ref 3.5–5.2)
Alkaline Phosphatase: 82 U/L (ref 39–117)
Bilirubin, Direct: 0.3 mg/dL (ref 0.0–0.3)
Indirect Bilirubin: 0.9 mg/dL (ref 0.2–1.2)
TOTAL PROTEIN: 6.4 g/dL (ref 6.0–8.3)
Total Bilirubin: 1.2 mg/dL (ref 0.2–1.2)

## 2014-10-19 LAB — LIPID PANEL
Cholesterol: 154 mg/dL (ref 0–200)
HDL: 59 mg/dL (ref >=40)
LDL Cholesterol: 82 mg/dL (ref 0–99)
TRIGLYCERIDES: 64 mg/dL (ref <150)
Total CHOL/HDL Ratio: 2.6 Ratio
VLDL: 13 mg/dL (ref 0–40)

## 2014-10-19 LAB — HEMOGLOBIN A1C
HEMOGLOBIN A1C: 6.1 % — AB (ref <5.7)
Mean Plasma Glucose: 128 mg/dL — ABNORMAL HIGH (ref <117)

## 2014-10-19 LAB — URIC ACID: Uric Acid, Serum: 7 mg/dL (ref 4.0–7.8)

## 2014-10-19 LAB — VITAMIN D 25 HYDROXY (VIT D DEFICIENCY, FRACTURES): VIT D 25 HYDROXY: 37 ng/mL (ref 30–100)

## 2014-10-19 LAB — PROTIME-INR
INR: 1.99 — ABNORMAL HIGH (ref <1.50)
Prothrombin Time: 22.6 seconds — ABNORMAL HIGH (ref 11.6–15.2)

## 2014-10-19 NOTE — Patient Outreach (Signed)
[facility]) Care Management  10/19/2014  Robert Arnold 09-Sep-1945 [id number]   Referral from Natividad Brood, RN, assigned Erenest Rasher, RN.  [name]. [name], [place] Management [place] Assistant Phone: [phone] Fax: [phone]

## 2014-11-02 ENCOUNTER — Other Ambulatory Visit: Payer: Self-pay | Admitting: Physician Assistant

## 2014-12-05 ENCOUNTER — Other Ambulatory Visit: Payer: Self-pay | Admitting: Physician Assistant

## 2014-12-05 ENCOUNTER — Ambulatory Visit (INDEPENDENT_AMBULATORY_CARE_PROVIDER_SITE_OTHER): Payer: Medicare Other | Admitting: Internal Medicine

## 2014-12-05 ENCOUNTER — Encounter: Payer: Self-pay | Admitting: Internal Medicine

## 2014-12-05 VITALS — BP 132/78 | HR 60 | Temp 97.2°F | Resp 16 | Ht 68.0 in | Wt 196.3 lb

## 2014-12-05 DIAGNOSIS — G4733 Obstructive sleep apnea (adult) (pediatric): Secondary | ICD-10-CM

## 2014-12-05 DIAGNOSIS — Z940 Kidney transplant status: Secondary | ICD-10-CM | POA: Diagnosis not present

## 2014-12-05 DIAGNOSIS — Z1331 Encounter for screening for depression: Secondary | ICD-10-CM

## 2014-12-05 DIAGNOSIS — E559 Vitamin D deficiency, unspecified: Secondary | ICD-10-CM | POA: Diagnosis not present

## 2014-12-05 DIAGNOSIS — I10 Essential (primary) hypertension: Secondary | ICD-10-CM | POA: Diagnosis not present

## 2014-12-05 DIAGNOSIS — I25709 Atherosclerosis of coronary artery bypass graft(s), unspecified, with unspecified angina pectoris: Secondary | ICD-10-CM | POA: Diagnosis not present

## 2014-12-05 DIAGNOSIS — Z7901 Long term (current) use of anticoagulants: Secondary | ICD-10-CM | POA: Diagnosis not present

## 2014-12-05 DIAGNOSIS — Z1389 Encounter for screening for other disorder: Secondary | ICD-10-CM

## 2014-12-05 DIAGNOSIS — N185 Chronic kidney disease, stage 5: Secondary | ICD-10-CM | POA: Diagnosis not present

## 2014-12-05 DIAGNOSIS — F329 Major depressive disorder, single episode, unspecified: Secondary | ICD-10-CM

## 2014-12-05 DIAGNOSIS — E785 Hyperlipidemia, unspecified: Secondary | ICD-10-CM

## 2014-12-05 DIAGNOSIS — I480 Paroxysmal atrial fibrillation: Secondary | ICD-10-CM

## 2014-12-05 DIAGNOSIS — Z789 Other specified health status: Secondary | ICD-10-CM

## 2014-12-05 DIAGNOSIS — Z9181 History of falling: Secondary | ICD-10-CM

## 2014-12-05 DIAGNOSIS — E1022 Type 1 diabetes mellitus with diabetic chronic kidney disease: Secondary | ICD-10-CM

## 2014-12-05 DIAGNOSIS — Z1212 Encounter for screening for malignant neoplasm of rectum: Secondary | ICD-10-CM

## 2014-12-05 DIAGNOSIS — N189 Chronic kidney disease, unspecified: Secondary | ICD-10-CM

## 2014-12-05 DIAGNOSIS — I5022 Chronic systolic (congestive) heart failure: Secondary | ICD-10-CM | POA: Diagnosis not present

## 2014-12-05 DIAGNOSIS — Z125 Encounter for screening for malignant neoplasm of prostate: Secondary | ICD-10-CM

## 2014-12-05 DIAGNOSIS — N320 Bladder-neck obstruction: Secondary | ICD-10-CM

## 2014-12-05 DIAGNOSIS — Z79899 Other long term (current) drug therapy: Secondary | ICD-10-CM

## 2014-12-05 DIAGNOSIS — F32A Depression, unspecified: Secondary | ICD-10-CM

## 2014-12-05 LAB — CBC WITH DIFFERENTIAL/PLATELET
Basophils Absolute: 0 10*3/uL (ref 0.0–0.1)
Basophils Relative: 1 % (ref 0–1)
EOS PCT: 2 % (ref 0–5)
Eosinophils Absolute: 0.1 10*3/uL (ref 0.0–0.7)
HEMATOCRIT: 41.9 % (ref 39.0–52.0)
HEMOGLOBIN: 14.3 g/dL (ref 13.0–17.0)
LYMPHS ABS: 1.2 10*3/uL (ref 0.7–4.0)
Lymphocytes Relative: 27 % (ref 12–46)
MCH: 29.9 pg (ref 26.0–34.0)
MCHC: 34.1 g/dL (ref 30.0–36.0)
MCV: 87.5 fL (ref 78.0–100.0)
MONO ABS: 0.4 10*3/uL (ref 0.1–1.0)
MONOS PCT: 8 % (ref 3–12)
MPV: 9.2 fL (ref 8.6–12.4)
NEUTROS PCT: 62 % (ref 43–77)
Neutro Abs: 2.9 10*3/uL (ref 1.7–7.7)
Platelets: 117 10*3/uL — ABNORMAL LOW (ref 150–400)
RBC: 4.79 MIL/uL (ref 4.22–5.81)
RDW: 14.1 % (ref 11.5–15.5)
WBC: 4.6 10*3/uL (ref 4.0–10.5)

## 2014-12-05 LAB — HEPATIC FUNCTION PANEL
ALBUMIN: 4.1 g/dL (ref 3.6–5.1)
ALT: 19 U/L (ref 9–46)
AST: 20 U/L (ref 10–35)
Alkaline Phosphatase: 80 U/L (ref 40–115)
BILIRUBIN INDIRECT: 1.4 mg/dL — AB (ref 0.2–1.2)
Bilirubin, Direct: 0.2 mg/dL (ref <=0.2)
TOTAL PROTEIN: 6.4 g/dL (ref 6.1–8.1)
Total Bilirubin: 1.6 mg/dL — ABNORMAL HIGH (ref 0.2–1.2)

## 2014-12-05 LAB — BASIC METABOLIC PANEL WITH GFR
BUN: 17 mg/dL (ref 7–25)
CALCIUM: 9.5 mg/dL (ref 8.6–10.3)
CO2: 28 mmol/L (ref 20–31)
Chloride: 103 mmol/L (ref 98–110)
Creat: 1.07 mg/dL (ref 0.70–1.25)
GFR, EST AFRICAN AMERICAN: 83 mL/min (ref >=60)
GFR, Est Non African American: 72 mL/min (ref >=60)
GLUCOSE: 146 mg/dL — AB (ref 65–99)
Potassium: 4.6 mmol/L (ref 3.5–5.3)
Sodium: 139 mmol/L (ref 135–146)

## 2014-12-05 LAB — PROTIME-INR
INR: 2.16 — AB (ref <1.50)
Prothrombin Time: 24.1 seconds — ABNORMAL HIGH (ref 11.6–15.2)

## 2014-12-05 LAB — LIPID PANEL
CHOLESTEROL: 185 mg/dL (ref 125–200)
HDL: 55 mg/dL (ref >=40)
LDL CALC: 100 mg/dL (ref <130)
TRIGLYCERIDES: 152 mg/dL — AB (ref <150)
Total CHOL/HDL Ratio: 3.4 Ratio (ref <=5.0)
VLDL: 30 mg/dL (ref <30)

## 2014-12-05 LAB — HEMOGLOBIN A1C
HEMOGLOBIN A1C: 6.4 % — AB (ref <5.7)
MEAN PLASMA GLUCOSE: 137 mg/dL — AB (ref <117)

## 2014-12-05 LAB — MAGNESIUM: Magnesium: 1.5 mg/dL (ref 1.5–2.5)

## 2014-12-05 LAB — TSH: TSH: 1.932 u[IU]/mL (ref 0.350–4.500)

## 2014-12-05 NOTE — Patient Instructions (Signed)
Recommend Adult Low dose Aspirin or   coated  Aspirin 81 mg daily   To reduce risk of Colon Cancer 20 %,   Skin Cancer 26 % ,   Melanoma 46%   and   Pancreatic cancer 60%  ++++++++++++++++++  Vitamin D goal   is between 70-100.   Please make sure that you are taking your Vitamin D as directed.   It is very important as a natural anti-inflammatory   helping hair, skin, and nails, as well as reducing stroke and heart attack risk.   It helps your bones and helps with mood.  It also decreases numerous cancer risks so please take it as directed.   Low Vit D is associated with a 200-300% higher risk for CANCER   and 200-300% higher risk for HEART   ATTACK  &  STROKE.   ......................................  It is also associated with higher death rate at younger ages,   autoimmune diseases like Rheumatoid arthritis, Lupus, Multiple Sclerosis.     Also many other serious conditions, like depression, Alzheimer's  Dementia, infertility, muscle aches, fatigue, fibromyalgia - just to name a few.  +++++++++++++++++++  Recommend the book "The END of DIETING" by Dr Joel Fuhrman   & the book "The END of DIABETES " by Dr Joel Fuhrman  At [employer] - get book & Audio CD's     Being diabetic has a  300% increased risk for heart attack, stroke, cancer, and alzheimer- type vascular dementia. It is very important that you work harder with diet by avoiding all foods that are white. Avoid white rice (brown & wild rice is OK), white potatoes (sweetpotatoes in moderation is OK), White bread or wheat bread or anything made out of white flour like bagels, donuts, rolls, buns, biscuits, cakes, pastries, cookies, pizza crust, and pasta (made from white flour & egg whites) - vegetarian pasta or spinach or wheat pasta is OK. Multigrain breads like Arnold's or [employer], or multigrain sandwich thins or flatbreads.  Diet, exercise and weight loss can reverse and cure diabetes in the early  stages.  Diet, exercise and weight loss is very important in the control and prevention of complications of diabetes which affects every system in your body, ie. Brain - dementia/stroke, eyes - glaucoma/blindness, heart - heart attack/heart failure, kidneys - dialysis, stomach - gastric paralysis, intestines - malabsorption, nerves - severe painful neuritis, circulation - gangrene & loss of a leg(s), and finally cancer and Alzheimers.    I recommend avoid fried & greasy foods,  sweets/candy, white rice (brown or wild rice or Quinoa is OK), white potatoes (sweet potatoes are OK) - anything made from white flour - bagels, doughnuts, rolls, buns, biscuits,white and wheat breads, pizza crust and traditional pasta made of white flour & egg white(vegetarian pasta or spinach or wheat pasta is OK).  Multi-grain bread is OK - like multi-grain flat bread or sandwich thins. Avoid alcohol in excess. Exercise is also important.    Eat all the vegetables you want - avoid meat, especially red meat and dairy - especially cheese.  Cheese is the most concentrated form of trans-fats which is the worst thing to clog up our arteries. Veggie cheese is OK which can be found in the fresh produce section at [employer] or [employer] or [employer]  ++++++++++++++++++++++++++   Preventive Care for Adults  A healthy lifestyle and preventive care can promote health and wellness. Preventive health guidelines for men include the following key practices:  A routine   yearly physical is a good way to check with your health care provider about your health and preventative screening. It is a chance to share any concerns and updates on your health and to receive a thorough exam.  Visit your dentist for a routine exam and preventative care every 6 months. Brush your teeth twice a day and floss once a day. Good oral hygiene prevents tooth decay and gum disease.  The frequency of eye exams is based on your age, health, family medical  history, use of contact lenses, and other factors. Follow your health care provider's recommendations for frequency of eye exams.  Eat a healthy diet. Foods such as vegetables, fruits, whole grains, low-fat dairy products, and lean protein foods contain the nutrients you need without too many calories. Decrease your intake of foods high in solid fats, added sugars, and salt. Eat the right amount of calories for you.Get information about a proper diet from your health care provider, if necessary.  Regular physical exercise is one of the most important things you can do for your health. Most adults should get at least 150 minutes of moderate-intensity exercise (any activity that increases your heart rate and causes you to sweat) each week. In addition, most adults need muscle-strengthening exercises on 2 or more days a week.  Maintain a healthy weight. The body mass index (BMI) is a screening tool to identify possible weight problems. It provides an estimate of body fat based on height and weight. Your health care provider can find your BMI and can help you achieve or maintain a healthy weight.For adults 20 years and older:  A BMI below 18.5 is considered underweight.  A BMI of 18.5 to 24.9 is normal.  A BMI of 25 to 29.9 is considered overweight.  A BMI of 30 and above is considered obese.  Maintain normal blood lipids and cholesterol levels by exercising and minimizing your intake of saturated fat. Eat a balanced diet with plenty of fruit and vegetables. Blood tests for lipids and cholesterol should begin at age 20 and be repeated every 5 years. If your lipid or cholesterol levels are high, you are over 50, or you are at high risk for heart disease, you may need your cholesterol levels checked more frequently.Ongoing high lipid and cholesterol levels should be treated with medicines if diet and exercise are not working.  If you smoke, find out from your health care provider how to quit. If you  do not use tobacco, do not start.  Lung cancer screening is recommended for adults aged 55-80 years who are at high risk for developing lung cancer because of a history of smoking. A yearly low-dose CT scan of the lungs is recommended for people who have at least a 30-pack-year history of smoking and are a current smoker or have quit within the past 15 years. A pack year of smoking is smoking an average of 1 pack of cigarettes a day for 1 year (for example: 1 pack a day for 30 years or 2 packs a day for 15 years). Yearly screening should continue until the smoker has stopped smoking for at least 15 years. Yearly screening should be stopped for people who develop a health problem that would prevent them from having lung cancer treatment.  If you choose to drink alcohol, do not have more than 2 drinks per day. One drink is considered to be 12 ounces (355 mL) of beer, 5 ounces (148 mL) of wine, or 1.5 ounces (44 mL)   of liquor.  Avoid use of street drugs. Do not share needles with anyone. Ask for help if you need support or instructions about stopping the use of drugs.  High blood pressure causes heart disease and increases the risk of stroke. Your blood pressure should be checked at least every 1-2 years. Ongoing high blood pressure should be treated with medicines, if weight loss and exercise are not effective.  If you are 45-79 years old, ask your health care provider if you should take aspirin to prevent heart disease.  Diabetes screening involves taking a blood sample to check your fasting blood sugar level. Testing should be considered at a younger age or be carried out more frequently if you are overweight and have at least 1 risk factor for diabetes.  Colorectal cancer can be detected and often prevented. Most routine colorectal cancer screening begins at the age of 50 and continues through age 75. However, your health care provider may recommend screening at an earlier age if you have risk factors  for colon cancer. On a yearly basis, your health care provider may provide home test kits to check for hidden blood in the stool. Use of a small camera at the end of a tube to directly examine the colon (sigmoidoscopy or colonoscopy) can detect the earliest forms of colorectal cancer. Talk to your health care provider about this at age 50, when routine screening begins. Direct exam of the colon should be repeated every 5-10 years through age 75, unless early forms of precancerous polyps or small growths are found.  Hepatitis C blood testing is recommended for all people born from 1945 through 1965 and any individual with known risks for hepatitis C.  Screening for abdominal aortic aneurysm (AAA)  by ultrasound is recommended for people who have history of high blood pressure or who are current or former smokers.  Healthy men should  receive prostate-specific antigen (PSA) blood tests as part of routine cancer screening. Talk with your health care provider about prostate cancer screening.  Testicular cancer screening is  recommended for adult males. Screening includes self-exam, a health care provider exam, and other screening tests. Consult with your health care provider about any symptoms you have or any concerns you have about testicular cancer.  Use sunscreen. Apply sunscreen liberally and repeatedly throughout the day. You should seek shade when your shadow is shorter than you. Protect yourself by wearing long sleeves, pants, a wide-brimmed hat, and sunglasses year round, whenever you are outdoors.  Once a month, do a whole-body skin exam, using a mirror to look at the skin on your back. Tell your health care provider about new moles, moles that have irregular borders, moles that are larger than a pencil eraser, or moles that have changed in shape or color.  Stay current with required vaccines (immunizations).  Influenza vaccine. All adults should be immunized every year.  Tetanus, diphtheria,  and acellular pertussis (Td, Tdap) vaccine. An adult who has not previously received Tdap or who does not know his vaccine status should receive 1 dose of Tdap. This initial dose should be followed by tetanus and diphtheria toxoids (Td) booster doses every 10 years. Adults with an unknown or incomplete history of completing a 3-dose immunization series with Td-containing vaccines should begin or complete a primary immunization series including a Tdap dose. Adults should receive a Td booster every 10 years.  Zoster vaccine. One dose is recommended for adults aged 60 years or older unless certain conditions are present.      PREVNAR - Pneumococcal 13-valent conjugate (PCV13) vaccine. When indicated, a person who is uncertain of his immunization history and has no record of immunization should receive the PCV13 vaccine. An adult aged 19 years or older who has certain medical conditions and has not been previously immunized should receive 1 dose of PCV13 vaccine. This PCV13 should be followed with a dose of pneumococcal polysaccharide (PPSV23) vaccine. The PPSV23 vaccine dose should be obtained at least 8 weeks after the dose of PCV13 vaccine. An adult aged 19 years or older who has certain medical conditions and previously received 1 or more doses of PPSV23 vaccine should receive 1 dose of PCV13. The PCV13 vaccine dose should be obtained 1 or more years after the last PPSV23 vaccine dose.    PNEUMOVAX - Pneumococcal polysaccharide (PPSV23) vaccine. When PCV13 is also indicated, PCV13 should be obtained first. All adults aged 65 years and older should be immunized. An adult younger than age 65 years who has certain medical conditions should be immunized. Any person who resides in a nursing home or long-term care facility should be immunized. An adult smoker should be immunized. People with an immunocompromised condition and certain other conditions should receive both PCV13 and PPSV23 vaccines. People with human  immunodeficiency virus (HIV) infection should be immunized as soon as possible after diagnosis. Immunization during chemotherapy or radiation therapy should be avoided. Routine use of PPSV23 vaccine is not recommended for American Indians, [place] Natives, or people younger than 65 years unless there are medical conditions that require PPSV23 vaccine. When indicated, people who have unknown immunization and have no record of immunization should receive PPSV23 vaccine. One-time revaccination 5 years after the first dose of PPSV23 is recommended for people aged 19-64 years who have chronic kidney failure, nephrotic syndrome, asplenia, or immunocompromised conditions. People who received 1-2 doses of PPSV23 before age 65 years should receive another dose of PPSV23 vaccine at age 65 years or later if at least 5 years have passed since the previous dose. Doses of PPSV23 are not needed for people immunized with PPSV23 at or after age 65 years.    Hepatitis A vaccine. Adults who wish to be protected from this disease, have certain high-risk conditions, work with hepatitis A-infected animals, work in hepatitis A research labs, or travel to or work in countries with a high rate of hepatitis A should be immunized. Adults who were previously unvaccinated and who anticipate close contact with an international adoptee during the first 60 days after arrival in the [place] from a country with a high rate of hepatitis A should be immunized.    Hepatitis B vaccine. Adults should be immunized if they wish to be protected from this disease, have certain high-risk conditions, may be exposed to blood or other infectious body fluids, are household contacts or sex partners of hepatitis B positive people, are clients or workers in certain care facilities, or travel to or work in countries with a high rate of hepatitis B.   Preventive Service / Frequency   Ages 65 and over  Blood pressure check.  Lipid and cholesterol  check.  Lung cancer screening. / Every year if you are aged 55-80 years and have a 30-pack-year history of smoking and currently smoke or have quit within the past 15 years. Yearly screening is stopped once you have quit smoking for at least 15 years or develop a health problem that would prevent you from having lung cancer treatment.  Fecal occult blood test (FOBT) of   stool. You may not have to do this test if you get a colonoscopy every 10 years.  Flexible sigmoidoscopy** or colonoscopy.** / Every 5 years for a flexible sigmoidoscopy or every 10 years for a colonoscopy beginning at age 50 and continuing until age 75.  Hepatitis C blood test.** / For all people born from 1945 through 1965 and any individual with known risks for hepatitis C.  Abdominal aortic aneurysm (AAA) screening./ Screening current or former smokers or have Hypertension.  Skin self-exam. / Monthly.  Influenza vaccine. / Every year.  Tetanus, diphtheria, and acellular pertussis (Tdap/Td) vaccine.** / 1 dose of Td every 10 years.   Zoster vaccine.** / 1 dose for adults aged 60 years or older.         Pneumococcal 13-valent conjugate (PCV13) vaccine.    Pneumococcal polysaccharide (PPSV23) vaccine.     Hepatitis A vaccine.** / Consult your health care provider.  Hepatitis B vaccine.** / Consult your health care provider. Screening for abdominal aortic aneurysm (AAA)  by ultrasound is recommended for people who have history of high blood pressure or who are current or former smokers. 

## 2014-12-05 NOTE — Progress Notes (Addendum)
Patient ID: Robert Arnold, male   DOB: 1945-04-28, 69 y.o.   MRN: [id number]  Comprehensive Evaluation & Examination    Assessment:   1. Essential hypertension  - [place], RETROPERITNL ABD,  LTD - TSH  2. Hyperlipidemia  - Lipid panel  3. Type 1 diabetes mellitus with diabetic chronic kidney disease  - HM DIABETES FOOT EXAM - LOW EXTREMITY NEUR EXAM DOCUM - Hemoglobin A1c  4. Vitamin D deficiency  - Vit D  25 hydroxy   5. Coronary artery disease involving coronary bypass graft of native heart with unspecified angina pectoris  - EKG 12-Lead  6. Chronic systolic heart failure   7. PAF (paroxysmal atrial fibrillation)   8. OSA (obstructive sleep apnea)   9. ESRD   10. Renal Transplant, s/p 09/2011   11. Long term current use of anticoagulant therapy  - Protime-INR  12. Screening for rectal cancer  - POC Hemoccult Bld/Stl   13. Prostate cancer screening   14. Depression screen   15. Medication management  - CBC with Differential/Platelet - BASIC METABOLIC PANEL WITH GFR - Hepatic function panel - Magnesium  16. Bladder neck obstruction  - PSA  Plan:   During the course of the visit the patient was educated and counseled about appropriate screening and preventive services including:    Pneumococcal vaccine   Influenza vaccine  Td vaccine  Screening electrocardiogram  Bone densitometry screening  Colorectal cancer screening  Diabetes screening  Glaucoma screening  Nutrition counseling   Advanced directives: requested  Screening recommendations, referrals: Vaccinations: Immunization History  Administered Date(s) Administered  . DTaP 04/08/2008  . Hepatitis B 04/08/2009  . Influenza, High Dose Seasonal PF 12/27/2013  . Pneumococcal Conjugate-13 10/25/2013  . Pneumococcal-23 04/08/2008  Shingles vaccine Deferred live virus vaccine in immunosuppresion Hep B vaccine not indicated  Nutrition assessed and recommended   Colonoscopy 01/01/2011 Recommended yearly ophthalmology/optometry visit for glaucoma screening and checkup Recommended yearly dental visit for hygiene and checkup Advanced directives - yes  Conditions/risks identified: BMI: Discussed weight loss, diet, and increase physical activity.  Increase physical activity: AHA recommends 150 minutes of physical activity a week.  Medications reviewed Diabetes is not at goal, ACE/ARB therapy: Not indicated due to allergy to ACE/ARB  Urinary Incontinence is not an issue: discussed non pharmacology and pharmacology options.  Fall risk: low- discussed PT, home fall assessment, medications.   Subjective:    Robert Arnold  presents for presents for a comprehensive evaluation, examination and management of multiple medical co-morbidities. This very nice 69 y.o. MWM presents with Hypertension, Hyperlipidemia, Pre-Diabetes and Vitamin D Deficiency. 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           Patient is treated for HTN since 1998 & BP has been controlled at home. Today's BP: 132/78 mmHg. Patient did have an immediate MI on the day of his kidney transplant  In June 2013. Then in Nov 2013 he underwent a elective CABG and has done fairly well since that time. Patient has had no complaints of any cardiac type chest pain, palpitations, dyspnea/orthopnea/PND, dizziness, claudication, or dependent edema.     Hyperlipidemia is controlled with diet & meds. Patient denies myalgias or other med SE's. Last Lipids were  Cholesterol 185; HDL 55; LDL 100; Triglycerides 152 on 12/05/2014:                              Also, the  patient has history of T2_NIDDM which predates diet controlled until 2007 when he was started on Metformin and then after renal transplant in June 2013 he was started on insulin. At that time he had received a living renal donor transplant from a step grandson. He continues on immunosuppressive therapy and is followed  locally by Dr Erling Cruz and also at [facility].  He has had no symptoms of reactive hypoglycemia, diabetic polys, paresthesias or visual blurring.  Last A1c was  6.1% on 10/18/2014.     Further, the patient also has history of Vitamin D Deficiency of 33 in 2012 and 24 in 2014 and supplements vitamin D without any suspected side-effects. Last vitamin D was  37 on 10/18/2014.     Names of Other Physician/Practitioners you currently use: 1. [facility] here for primary care 2. Dr Luretha Rued, [place], eye doctor, last visit - scheduled  In 1 week  3. Dr Evelene Croon, [place], dentist, last visit 3 months ago  Patient Care Team: [name], MD as PCP - General (Internal Medicine) Estanislado Emms, MD as Consulting Physician (Nephrology) Jolaine Artist, MD as Consulting Physician (Cardiology) Estevan Ryder, MD as Referring Physician (Cardiology) Inda Castle, MD as Consulting Physician (Gastroenterology) Macarthur Critchley, OD as Referring Physician (Optometry)  Medication Review: Medication Sig  . betamethasone dipropionate (DIPROLENE) 0.05 % cream Apply topically 2 (two) times daily.  Marland Kitchen buPROPion XL 300 MG 24 hr tablet TAKE 1 TABLET BY MOUTH EVERY MORNING.  . VITAMIN D  Take 1,000 Int'l Units by mouth daily.   . finasteride  5 MG tablet TAKE 1 TABLET BY MOUTH DAILY  . hydrOXYzine  25 MG tablet TAKE 1 OR 2 TABLETS BY MOUTH 2 TO 4 TIMES DAILY AS NEEDED FOR ANXIETY  . iNOVOLIN 70/30 injection Take 20-30 units 2 x day or as directed  . MYCOPHENOLIC ACID XX123456 MG  TAKE 1 TABLET BY MOUTH TWICE DAILY  . SEPTRA 400-80 MG per tablet Take 1 tablet by mouth every Monday, Wednesday, and Friday.   . tacrolimus (PROGRAF) 0.5 MG capsule Take 0.5 mg by mouth 2 (two) times daily.   . tacrolimus (PROGRAF) 1 MG capsule Take 1 mg by mouth 2 (two) times daily.   . TRAVATAN Z 0.004 % SOLN ophthalmic solution Place 1 drop into both eyes at bedtime.   Marland Kitchen warfarin  (COUMADIN) 5 MG tablet Take 5 mg by mouth daily.    Allergies  Allergen Reactions  . Crestor [Rosuvastatin]     Elevated LFT's  . Lorazepam   . Losartan    Current Problems (verified) Patient Active Problem List   Diagnosis Date Noted  . Morbid obesity (BMI 30.32) 10/18/2014  . Depression, controlled 06/29/2014  . Renal Transplant, s/p 09/2011 11/26/2013  . NSTEMI 09/2011 11/26/2013  . Vitamin D deficiency 08/17/2013  . Medication management 08/17/2013  . Long term current use of anticoagulant therapy 08/17/2013  . Type 1 diabetes mellitus with renal manifestations   . Hyperlipidemia   . BPH (benign prostatic hyperplasia)   . Gout   . PAF (paroxysmal atrial fibrillation)   . OSA (obstructive sleep apnea)   . Histoplasmosis   . Hypertension   . Chronic kidney disease   . ASCAD s/p PTCA/Stent LAD 09/2011   . Chronic systolic heart failure [id number]   Screening Tests Health Maintenance  Topic Date Due  . Hepatitis C Screening  10-03-1945  . OPHTHALMOLOGY EXAM  03/29/1958  .  TETANUS/TDAP  03/30/1967  . ZOSTAVAX  03/29/2008  . PNA vac Low Risk Adult (2 of 2 - PPSV23) 10/26/2014  . INFLUENZA VACCINE  11/07/2014  . FOOT EXAM  11/27/2014  . URINE MICROALBUMIN  11/27/2014  . HEMOGLOBIN A1C  04/20/2015  . COLONOSCOPY  01/01/2016   Immunization History  Administered Date(s) Administered  . DTaP 04/08/2008  . Hepatitis B 04/08/2009  . Influenza, High Dose Seasonal PF 12/27/2013  . Pneumococcal Conjugate-13 10/25/2013  . Pneumococcal-Unspecified 04/08/2008   Preventative care: Last colonoscopy: 01/01/2011  Past Medical History  Diagnosis Date  . Diabetes mellitus type 2, controlled   . Hyperlipidemia   . BPH (benign prostatic hyperplasia)   . Gout   . CAD (coronary artery disease)     s/p post-op NSTEMI. treated with stent to LAD. at [facility]  . PAF (paroxysmal atrial fibrillation)     s/p DC-CV in 6/13. Off coumadin due to ureteral bleed  . OSA  (obstructive sleep apnea)   . 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 HTN (hypertension)   . MI (myocardial infarction) 09/26/2011  . Chronic kidney disease     due IgA nephropathy - s/p kidnet transplant 09/25/11   Past Surgical History  Procedure Laterality Date  . Colonoscopy    . Peritoneal catheter insertion  2011  . Av fistula placement  2011    Left forearm  . Kidney transplant  09/25/2011  . Coronary angioplasty with stent placement     Risk Factors: Tobacco Social History  Substance Use Topics  . Smoking status: Former Smoker    Quit date: 11/29/1990  . Smokeless tobacco: Never Used  . Alcohol Use: Yes     Comment: 1-2 a month   He does not smoke.  Patient is a former smoker. Are there smokers in your home (other than you)?  No  Alcohol Current alcohol use: rre -6 beers/month  Caffeine Current caffeine use: coffee 2 cups /day  Exercise Current exercise: Gym 3 x /week  Nutrition/Diet Current diet: in general, a "healthy" diet    Cardiac risk factors: advanced age (older than 31 for men, 60 for women), diabetes mellitus, dyslipidemia, hypertension, male gender, obesity (BMI >= 30 kg/m2), sedentary lifestyle and smoking/ tobacco exposure.  Depression Screen (Note: if answer to either of the following is "Yes", a more complete depression screening is indicated)   Q1: Over the past two weeks, have you felt down, depressed or hopeless? No  Q2: Over the past two weeks, have you felt little interest or pleasure in doing things? No  Have you lost interest or pleasure in daily life? No  Do you often feel hopeless? No  Do you cry easily over simple problems? No  Activities of Daily Living In your present state of health, do you have any difficulty performing the following activities?:  Driving? No Managing money?  No Feeding yourself? No Getting from bed to chair? No Climbing a flight of stairs? No Preparing food and eating?: No Bathing or  showering? No Getting dressed: No Getting to the toilet? No Using the toilet:No Moving around from place to place: No In the past year have you fallen or had a near fall?:Yes   Are you sexually active?  No  Do you have more than one partner?  No  Vision Difficulties: No  Hearing Difficulties: No Do you often ask people to speak up or repeat themselves? No Do you experience ringing or noises in your ears?  No Do you have difficulty understanding soft or whispered voices? No  Cognition  Do you feel that you have a problem with memory?No  Do you often misplace items? No  Do you feel safe at home?  Yes  Advanced directives Does patient have a [place]? Yes Does patient have a Living Will? Yes  ROS: Constitutional: Denies fever, chills, weight loss/gain, headaches, insomnia, fatigue, night sweats or change in appetite. Eyes: Denies redness, blurred vision, diplopia, discharge, itchy or watery eyes.  ENT: Denies discharge, congestion, post nasal drip, epistaxis, sore throat, earache, hearing loss, dental pain, Tinnitus, Vertigo, Sinus pain or snoring.  Cardio: Denies chest pain, palpitations, irregular heartbeat, syncope, dyspnea, diaphoresis, orthopnea, PND, claudication or edema Respiratory: denies cough, dyspnea, DOE, pleurisy, hoarseness, laryngitis or wheezing.  Gastrointestinal: Denies dysphagia, heartburn, reflux, water brash, pain, cramps, nausea, vomiting, bloating, diarrhea, constipation, hematemesis, melena, hematochezia, jaundice or hemorrhoids Genitourinary: Denies dysuria, frequency, urgency, nocturia, hesitancy, discharge, hematuria or flank pain Musculoskeletal: Denies arthralgia, myalgia, stiffness, Jt. Swelling, pain, limp or strain/sprain. Denies Falls. Skin: Denies puritis, rash, hives, warts, acne, eczema or change in skin lesion Neuro: No weakness, tremor, incoordination, spasms, paresthesia or pain Psychiatric: Denies confusion, memory loss  or sensory loss. Denies Depression. Endocrine: Denies change in weight, skin, hair change, nocturia, and paresthesia, diabetic polys, visual blurring or hyper / hypo glycemic episodes.  Heme/Lymph: No excessive bleeding, bruising or enlarged lymph nodes.  Objective:     BP 132/78 mmHg  Pulse 60  Temp(Src) 97.2 F (36.2 C)  Resp 16  Ht 5\' 8"  (1.727 m)  Wt 196 lb 5.4 oz (89.059 kg)  BMI 29.86 kg/m2  General Appearance:  Alert  WD/WN, male  in no apparent distress. Eyes: PERRLA, EOMs nl, conjunctiva normal, normal fundi and vessels. Sinuses: No frontal/maxillary tenderness ENT/Mouth: EACs patent / TMs  nl. Nares clear without erythema, swelling, mucoid exudates. Oral hygiene is good. No erythema, swelling, or exudate. Tongue normal, non-obstructing. Tonsils not swollen or erythematous. Hearing normal.  Neck: Supple, thyroid normal. No bruits, nodes or JVD. Respiratory: Respiratory effort normal.  BS equal and clear bilateral without rales, rhonci, wheezing or stridor. Cardio: Heart sounds are normal with regular rate and rhythm and no murmurs, rubs or gallops. Peripheral pulses are normal and equal bilaterally without edema. No aortic or femoral bruits. Chest: symmetric with normal excursions and percussion.  Abdomen: Flat, soft, with nl bowel sounds. Nontender, no guarding, rebound, hernias, masses, or organomegaly.  Lymphatics: Non tender without lymphadenopathy.  Genitourinary: No hernias.Testes nl. DRE - prostate nl for age - smooth & firm w/o nodules. Musculoskeletal: Full ROM all peripheral extremities, joint stability, 5/5 strength, and normal gait. Skin: Warm and dry without rashes, lesions, cyanosis, clubbing or  ecchymosis.  Neuro: Cranial nerves intact, reflexes equal bilaterally. Normal muscle tone, no cerebellar symptoms. Sensation intact.  Pysch: Alert and oriented X 3 with normal affect, insight and judgment appropriate.   Cognitive Testing  Alert? Yes  Normal Appearance?  Yes  Oriented to person? Yes  Place? Yes   Time? Yes  Recall of three objects?  Yes  Can perform simple calculations? Yes  Displays appropriate judgment? Yes  Can read the correct time from a watch/clock? Yes  Medicare Attestation I have personally reviewed: The patient's medical and social history Their use of alcohol, tobacco or illicit drugs Their current medications and supplements The patient's functional ability including ADLs,fall risks, home safety risks, cognitive, and hearing and visual impairment Diet and physical activities  Evidence for depression or mood disorders  The patient's weight, height, BMI, and visual acuity have been recorded in the chart.  I have made referrals, counseling, and provided education to the patient based on review of the above and I have provided the patient with a written personalized care plan for preventive services.  Over 40 minutes of exam, counseling, chart review was performed.   Stelios Kirby DAVID, MD   12/05/2014

## 2014-12-06 LAB — VITAMIN D 25 HYDROXY (VIT D DEFICIENCY, FRACTURES): VIT D 25 HYDROXY: 28 ng/mL — AB (ref 30–100)

## 2014-12-06 LAB — PSA: PSA: 0.04 ng/mL (ref <=4.00)

## 2014-12-28 ENCOUNTER — Other Ambulatory Visit: Payer: Self-pay | Admitting: *Deleted

## 2014-12-28 DIAGNOSIS — Z1212 Encounter for screening for malignant neoplasm of rectum: Secondary | ICD-10-CM

## 2014-12-28 LAB — POC HEMOCCULT BLD/STL (HOME/3-CARD/SCREEN)
Card #2 Fecal Occult Blod, POC: NEGATIVE
Card #3 Fecal Occult Blood, POC: NEGATIVE
FECAL OCCULT BLD: NEGATIVE

## 2015-01-17 ENCOUNTER — Ambulatory Visit (INDEPENDENT_AMBULATORY_CARE_PROVIDER_SITE_OTHER): Payer: Medicare Other | Admitting: Internal Medicine

## 2015-01-17 ENCOUNTER — Encounter: Payer: Self-pay | Admitting: Internal Medicine

## 2015-01-17 VITALS — BP 138/64 | HR 50 | Temp 98.2°F | Resp 16 | Ht 67.75 in | Wt 204.0 lb

## 2015-01-17 DIAGNOSIS — I25709 Atherosclerosis of coronary artery bypass graft(s), unspecified, with unspecified angina pectoris: Secondary | ICD-10-CM

## 2015-01-17 DIAGNOSIS — Z7901 Long term (current) use of anticoagulants: Secondary | ICD-10-CM

## 2015-01-17 DIAGNOSIS — Z23 Encounter for immunization: Secondary | ICD-10-CM

## 2015-01-17 LAB — PROTIME-INR
INR: 2.21 — AB (ref <1.50)
PROTHROMBIN TIME: 24.8 s — AB (ref 11.6–15.2)

## 2015-01-17 MED ORDER — HYOSCYAMINE SULFATE 0.125 MG SL SUBL: 0.1250 mg | SUBLINGUAL_TABLET | SUBLINGUAL | Status: DC | PRN

## 2015-01-17 NOTE — Progress Notes (Signed)
Patient ID: Robert Arnold, male   DOB: 02-07-1946, 69 y.o.   MRN: [id number]  Assessment and Plan:    1. Long term current use of anticoagulant therapy -PT/iNR -cont coumadin   2. Coronary artery disease involving coronary bypass graft of native heart with unspecified angina pectoris -PT/INR  3. Need for prophylactic vaccination and inoculation against influenza  - Flu vaccine HIGH DOSE PF (Fluzone High dose) - Protime-INR          HPI 68 y.o.male presents for 1 month follow up of longterm anticoagulation. Patient reports that they have been doing well.  male is taking their medication.  They are having difficulty with their medications.  They report no adverse reactions.  He has not been on antibiotic for the past couple weeks.  He reports that he has not fallen.    Patient reports that he is also having some diarrhea x 5-6 weeks.  He reports that he has it several times a week.  He reports that he did stop bactrim.  He does have some stomach cramping with the diarrhea.  He reports that the stool itself is very loose.  He is taking magnesium several times a week.  He reports that he has been on bactrim since June of 2013.  He reports that he has not been taking a probiotic.      Past Medical History  Diagnosis Date  . Diabetes mellitus type 2, controlled ([place])   . Hyperlipidemia   . BPH (benign prostatic hyperplasia)   . Gout   . CAD (coronary artery disease)     s/p post-op NSTEMI. treated with stent to LAD. at [facility]  . PAF (paroxysmal atrial fibrillation) ([place])     s/p DC-CV in 6/13. Off coumadin due to ureteral bleed  . OSA (obstructive sleep apnea)   . 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 HTN (hypertension)   . MI (myocardial infarction) ([place]) 09/26/2011  . Chronic kidney disease     due IgA nephropathy - s/p kidnet transplant 09/25/11     Allergies  Allergen Reactions  . Crestor [Rosuvastatin]     Elevated LFT's  .  Lorazepam   . Losartan       Current Outpatient Prescriptions on File Prior to Visit  Medication Sig Dispense Refill  . aspirin EC 81 MG tablet Take 81 mg by mouth daily.    . betamethasone dipropionate (DIPROLENE) 0.05 % cream Apply topically 2 (two) times daily. 45 g 5  . buPROPion (WELLBUTRIN XL) 300 MG 24 hr tablet TAKE 1 TABLET BY MOUTH EVERY MORNING. 90 tablet 1  . Cholecalciferol (VITAMIN D PO) Take 1,000 Int'l Units by mouth daily.     . finasteride (PROSCAR) 5 MG tablet TAKE 1 TABLET BY MOUTH DAILY 90 tablet 3  . FREESTYLE TEST STRIPS test strip CHECK BLOOD SUGAR 3-4 TIMES A DAY FOR MEDICATION ADJUSTMENT 450 each PRN  . hydrOXYzine (ATARAX/VISTARIL) 25 MG tablet TAKE 1 OR 2 TABLETS BY MOUTH 2 TO 4 TIMES DAILY AS NEEDED FOR ANXIETY 120 tablet 99  . insulin NPH-regular Human (NOVOLIN 70/30) (70-30) 100 UNIT/ML injection Take 20-30 units 2 x day or as directed 10 mL 99  . Insulin Pen Needle 31G X 5 MM MISC BD ultra fine pen needles.  Patient checks glucose 2 times daily.  DX-250.01 50 each 11  . Magnesium 500 MG TABS Take by mouth. Takes 1 tab 3 times a week    .  Mycophenolate Sodium (MYCOPHENOLIC ACID) XX123456 MG TBEC TAKE 1 TABLET BY MOUTH TWICE DAILY 60 tablet 99  . pravastatin (PRAVACHOL) 40 MG tablet TAKE 1 TABLET BY MOUTH EVERY DAY 90 tablet 0  . tacrolimus (PROGRAF) 0.5 MG capsule Take 0.5 mg by mouth 2 (two) times daily.   11  . tacrolimus (PROGRAF) 1 MG capsule Take 1 mg by mouth 2 (two) times daily.   2  . TRAVATAN Z 0.004 % SOLN ophthalmic solution Place 1 drop into both eyes at bedtime.     Marland Kitchen warfarin (COUMADIN) 5 MG tablet TAKE 1-2 TABLETS BY MOUTH DAILY AS DIRECTED 180 tablet PRN   No current facility-administered medications on file prior to visit.    ROS: all negative except above.   Physical Exam: Filed Weights   01/17/15 0932  Weight: 204 lb (92.534 kg)   BP 138/64 mmHg  Pulse 50  Temp(Src) 98.2 F (36.8 C) (Temporal)  Resp 16  Ht 5' 7.75" (1.721 m)  Wt 204  lb (92.534 kg)  BMI 31.24 kg/m2 General Appearance: Well developed well nourished, non-toxic appearing in no apparent distress. Eyes: PERRLA, EOMs, conjunctiva w/ no swelling or erythema or discharge Sinuses: No Frontal/maxillary tenderness ENT/Mouth: Ear canals clear without swelling or erythema.  TM's normal bilaterally with no retractions, bulging, or loss of landmarks.   Neck: Supple, thyroid normal, no notable JVD  Respiratory: Respiratory effort normal, Clear breath sounds anteriorly and posteriorly bilaterally without rales, rhonchi, wheezing or stridor. No retractions or accessory muscle usage. Cardio: RRR with no MRGs.   Abdomen: Soft, + BS.  Non tender, no guarding, rebound, hernias, masses.  Musculoskeletal: Full ROM, 5/5 strength, normal gait.  Skin: Warm, dry without rashes  Neuro: Awake and oriented X 3, Cranial nerves intact. Normal muscle tone, no cerebellar symptoms. Sensation intact.  Psych: normal affect, Insight and Judgment appropriate.     [name], PA-C 9:43 AM [facility]

## 2015-02-16 ENCOUNTER — Other Ambulatory Visit: Payer: Self-pay | Admitting: Physician Assistant

## 2015-02-16 DIAGNOSIS — E782 Mixed hyperlipidemia: Secondary | ICD-10-CM

## 2015-02-22 ENCOUNTER — Ambulatory Visit: Payer: Self-pay | Admitting: Physician Assistant

## 2015-03-31 ENCOUNTER — Other Ambulatory Visit: Payer: Self-pay

## 2015-04-04 ENCOUNTER — Encounter: Payer: Self-pay | Admitting: Internal Medicine

## 2015-04-04 NOTE — Progress Notes (Signed)
Patient ID: Robert Arnold, male   DOB: 1945-10-26, 69 y.o.   MRN: [id number]  Medicare Annual Wellness Visit and  Comprehensive Evaluation & Examination   Assessment:   1. Essential hypertension  - TSH  2. Hyperlipidemia  - Lipid panel - TSH  3. Type 1 diabetes mellitus with diabetic chronic kidney disease, unspecified CKD stage (HCC)  - Hemoglobin A1c - Insulin, random  4. Vitamin D deficiency  - VITAMIN D 25 Hydroxy   5. Coronary artery disease involving coronary bypass graft  ([place])   6. PAF (paroxysmal atrial fibrillation) ([place])  - Protime-INR  7. Renal Transplant, s/p 09/2011   8. Idiopathic gout  - Uric acid  9. Long term current use of anticoagulant therapy  - Protime-INR  10. Encounter for Medicare annual wellness exam   11. Medication management  - CBC with Differential/Platelet - BASIC METABOLIC PANEL WITH GFR - Hepatic function panel - Magnesium  Plan:   During the course of the visit the patient was educated and counseled about appropriate screening and preventive services including:    Pneumococcal vaccine   Influenza vaccine  Td vaccine  Screening electrocardiogram  Bone densitometry screening  Colorectal cancer screening  Diabetes screening  Glaucoma screening  Nutrition counseling   Advanced directives: requested  Screening recommendations, referrals: Vaccinations: Immunization History  Administered Date(s) Administered  . DTaP 04/08/2008  . Hepatitis B 04/08/2009  . Influenza, High Dose Seasonal PF 12/27/2013, 01/17/2015  . Pneumococcal Conjugate-13 10/25/2013  . Pneumococcal-Unspecified 04/08/2008  Shingles vaccine defered by his transplant team due to immunosupression  Nutrition assessed and recommended  Colonoscopy 01/01/2011 Recommended yearly ophthalmology/optometry visit for glaucoma screening and checkup Recommended yearly dental visit for hygiene and checkup Advanced directives -  yes  Conditions/risks identified: BMI: Discussed weight loss, diet, and increase physical activity.  Increase physical activity: AHA recommends 150 minutes of physical activity a week.  Medications reviewed Diabetes is not at goal, ACE/ARB therapy: deferred due to Kidney Transplant Urinary Incontinence is not an issue: discussed non pharmacology and pharmacology options.  Fall risk: low- discussed PT, home fall assessment, medications.   Subjective:      Robert Arnold  presents for [employer] Visit and presents for a comprehensive evaluation, examination and management of multiple medical co-morbidities.  Date of last medicare wellness visit was July 2015.  This very nice 69 y.o. MWM presents for 6 month follow up with Hypertension, ASCAD s/p pAfib, Hyperlipidemia, Pre-Diabetes and Vitamin D Deficiency. Patient has IgA Nephropathy ESRD and is s/p Renal Transplant 09/2011 (living donor from a step grandson) and had a po MI therefore receiving PTCA & DES and  also had po pAfib and has been on Coumadin since. In Nov 2013, he underwent elective CABG. Patient is followed by Dr Erling Cruz and also at [facility].      Patient is treated for HTN since 1998 & BP has been controlled at home. Today's BP: (!) 152/80 mmHg. Patient has had no complaints of any cardiac type chest pain, palpitations, dyspnea/orthopnea/PND, dizziness, claudication, or dependent edema.     Hyperlipidemia is controlled with diet & meds. Patient denies myalgias or other med SE's. 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 Reports CBG's range in the  80-10 mg% range. [facility]su takes his Novolin 70/30 at 22 u QAM & 12 QPM and dust increase accordingly if readings are elevated.  Last A1c was 6.4% on 12/05/2014.     Further, the patient also has history of Vitamin D Deficiency of 33 in 2014 and 24 in 2014 and supplements vitamin D without any suspected side-effects. Last vitamin D was still very low at 28 on 12/05/2014.  Names of Other Physician/Practitioners you currently use: 1. [facility] here for primary care 2. Dr Macarthur Critchley, [place], eye doctor, last was Nov 2016 3. Dr Evelene Croon, [place], dentist, last visit Sept 2016  Patient Care Team: [name], MD as PCP - General (Internal Medicine) Estanislado Emms, MD as Consulting Physician (Nephrology) Jolaine Artist, MD as Consulting Physician (Cardiology) Estevan Ryder, MD as Referring Physician (Cardiology) Inda Castle, MD as Consulting Physician (Gastroenterology) Macarthur Critchley, OD as Referring Physician (Optometry)  Medication Review: Medication Sig  . aspirin EC 81 MG tablet Take 81 mg by mouth daily.  . betamethasone dipro (DIPROLENE) 0.05 % crm Apply topically 2 (two) times daily.  Marland Kitchen buPROPion  XL 300 MG 24 hr tablet TAKE 1 TABLET BY MOUTH EVERY MORNING.  . VITAMIN D Take 1,000 Int'l Units by mouth daily.   . finasteride  5 MG tablet TAKE 1 TABLET BY MOUTH DAILY  . hydrOXYzine 25 MG tablet TAKE 1 OR 2 TABLETS BY MOUTH 2 TO 4 TIMES DAILY AS NEEDED FOR ANXIETY  . hyoscyamine/SL 0.125 MG SL tablet Place 1 tablet (0.125 mg total) under the tongue every 4 (four) hours as needed.  Marland Kitchen NOVOLIN 70/30 100 UNIT/ML inj Take 20-30 units 2 x day or as directed  . Magnesium 500 MG TABS Take by mouth. Takes 1 tab 3 times a week  . Mycophenolate (MYCOPHENOLIC ACID) [id number] MG  TAKE 1 TABLET BY MOUTH TWICE DAILY  . pravastatin40 MG tablet TAKE 1 TABLET BY MOUTH EVERY DAY  . tacrolimus (PROGRAF) 0.5 MG capsule Take 0.5 mg by mouth 2 (two) times daily.   . tacrolimus (PROGRAF) 1  MG capsule Take 1 mg by mouth 2 (two) times daily.   . TRAVATAN Z 0.004 % SOLN ophth soln Place 1 drop into both eyes at bedtime.   Marland Kitchen warfarin (COUMADIN) 5 MG tablet TAKE 1-2 TABLETS BY MOUTH DAILY AS DIRECTED   Allergies  Allergen Reactions  . Crestor [Rosuvastatin]     Elevated LFT's  . Lorazepam   . Losartan    Current Problems (verified) Patient Active Problem List   Diagnosis Date Noted  . Medicare annual wellness exam 04/04/2015  . Morbid obesity (BMI 30.32) 10/18/2014  . Depression, controlled 06/29/2014  . Renal Transplant, s/p 09/2011 11/26/2013  . NSTEMI 09/2011 11/26/2013  . Vitamin D deficiency 08/17/2013  . Medication management 08/17/2013  . Long term current use of anticoagulant therapy 08/17/2013  . Type 1 diabetes mellitus with renal manifestations ([place])   . Hyperlipidemia   . BPH (benign prostatic hyperplasia)   . Gout   . PAF (paroxysmal atrial fibrillation) ([place])   . OSA (obstructive sleep apnea)   . Histoplasmosis   . Hypertension   . Chronic kidney disease   . ASCAD s/p PTCA/Stent LAD 09/2011   . Chronic systolic heart failure ([place]) 11/30/2011   Screening Tests Health Maintenance  Topic Date Due  .  Hepatitis C Screening  24-Dec-1945  . OPHTHALMOLOGY EXAM  03/29/1958  . TETANUS/TDAP  03/30/1967  . ZOSTAVAX  03/29/2008  . PNA vac Low Risk Adult (2 of 2 - PPSV23) 10/26/2014  . URINE MICROALBUMIN  11/27/2014  . HEMOGLOBIN A1C  06/06/2015  . INFLUENZA VACCINE  11/07/2015  . FOOT EXAM  12/05/2015  . COLONOSCOPY  01/01/2016   Immunization History  Administered Date(s) Administered  . DTaP 04/08/2008  . Hepatitis B 04/08/2009  . Influenza, High Dose Seasonal PF 12/27/2013, 01/17/2015  . Pneumococcal Conjugate-13 10/25/2013  . Pneumococcal-Unspecified 04/08/2008   Preventative care: Last colonoscopy: 01/01/2015  Past Medical History  Diagnosis Date  . Diabetes mellitus type 2, controlled ([place])   . Hyperlipidemia   . BPH (benign prostatic  hyperplasia)   . Gout   . CAD (coronary artery disease)     s/p post-op NSTEMI. treated with stent [facility] LAD. [facility]  . PAF (paroxysmal atrial fibrillation) ([place])     s/p DC-CV in 6/13. Off coumadin due to ureteral bleed  . OSA (obstructive sleep apnea)   . 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 HTN (hypertension)   . MI (myocardial infarction) ([place]) 09/26/2011  . Chronic kidney disease     due IgA nephropathy - s/p kidnet transplant 09/25/11   Past Surgical History  Procedure Laterality Date  . Colonoscopy    . Peritoneal catheter insertion  2011  . Av fistula placement  2011    Left forearm  . Kidney transplant  09/25/2011  . Coronary angioplasty with stent placement     Risk Factors: Tobacco Social History  Substance Use Topics  . Smoking status: Former Smoker    Quit date: 11/29/1990  . Smokeless tobacco: Never Used  . Alcohol Use: Yes     Comment: 1-2 a month   He does not smoke.  Patient is a former smoker. Are there smokers in your home (other than you)?  No  Alcohol Current alcohol use: social drinker  Caffeine Current caffeine use: coffee 2 cups /day  Exercise Current exercise: gym 3 x/week  Nutrition/Diet Current diet: in general, a "healthy" diet    Cardiac risk factors: advanced age (older than 26 for men, 81 for women), diabetes mellitus, dyslipidemia, hypertension, male gender, obesity (BMI >= 30 kg/m2), sedentary lifestyle and smoking/ tobacco exposure.  Depression Screen (Note: if answer to either of the following is "Yes", a more complete depression screening is indicated)   Q1: Over the past two weeks, have you felt down, depressed or hopeless? No  Q2: Over the past two weeks, have you felt little interest or pleasure in doing things? No  Have you lost interest or pleasure in daily life? No  Do you often feel hopeless? No  Do you cry easily over simple problems? No  Activities of Daily Living In your present  state of health, do you have any difficulty performing the following activities?:  Driving? No Managing money?  No Feeding yourself? No Getting from bed to chair? No Climbing a flight of stairs? No Preparing food and eating?: No Bathing or showering? No Getting dressed: No Getting to the toilet? No Using the toilet:No Moving around from place to place: No In the past year have you fallen or had a near fall?:No   Are you sexually active?  Yes  Do you have more than one partner?  No  Vision Difficulties: No  Hearing Difficulties: No Do you often ask people to  speak up or repeat themselves? No Do you experience ringing or noises in your ears? No Do you have difficulty understanding soft or whispered voices? No  Cognition  Do you feel that you have a problem with memory?No  Do you often misplace items? No  Do you feel safe at home?  Yes  Advanced directives Does patient have a [place]? Yes Does patient have a Living Will? Yes  ROS: Constitutional: Denies fever, chills, weight loss/gain, headaches, insomnia, fatigue, night sweats or change in appetite. Eyes: Denies redness, blurred vision, diplopia, discharge, itchy or watery eyes.  ENT: Denies discharge, congestion, post nasal drip, epistaxis, sore throat, earache, hearing loss, dental pain, Tinnitus, Vertigo, Sinus pain or snoring.  Cardio: Denies chest pain, palpitations, irregular heartbeat, syncope, dyspnea, diaphoresis, orthopnea, PND, claudication or edema Respiratory: denies cough, dyspnea, DOE, pleurisy, hoarseness, laryngitis or wheezing.  Gastrointestinal: Denies dysphagia, heartburn, reflux, water brash, pain, cramps, nausea, vomiting, bloating, diarrhea, constipation, hematemesis, melena, hematochezia, jaundice or hemorrhoids Genitourinary: Denies dysuria, frequency, urgency, nocturia, hesitancy, discharge, hematuria or flank pain Musculoskeletal: Denies arthralgia, myalgia, stiffness, Jt.  Swelling, pain, limp or strain/sprain. Denies Falls. Skin: Denies puritis, rash, hives, warts, acne, eczema or change in skin lesion Neuro: No weakness, tremor, incoordination, spasms, paresthesia or pain Psychiatric: Denies confusion, memory loss or sensory loss. Denies Depression. Endocrine: Denies change in weight, skin, hair change, nocturia, and paresthesia, diabetic polys, visual blurring or hyper / hypo glycemic episodes.  Heme/Lymph: No excessive bleeding, bruising or enlarged lymph nodes.  Objective:     BP 152/80 mmHg  Pulse 64  Temp(Src) 97.3 F (36.3 C)  Resp 16  Ht 5\' 8"  (1.727 m)  Wt 206 lb (93.441 kg)  BMI 31.33 kg/m2  General Appearance:  Alert  WD/WN, male  in no apparent distress. Eyes: PERRLA, EOMs nl, conjunctiva normal, normal fundi and vessels. Sinuses: No frontal/maxillary tenderness ENT/Mouth: EACs patent / TMs  nl. Nares clear without erythema, swelling, mucoid exudates. Oral hygiene is good. No erythema, swelling, or exudate. Tongue normal, non-obstructing. Tonsils not swollen or erythematous. Hearing normal.  Neck: Supple, thyroid normal. No bruits, nodes or JVD. Respiratory: Respiratory effort normal.  BS equal and clear bilateral without rales, rhonci, wheezing or stridor. Cardio: Heart sounds are normal with regular rate and rhythm and no murmurs, rubs or gallops. Peripheral pulses are normal and equal bilaterally without edema. No aortic or femoral bruits. Chest: symmetric with normal excursions and percussion.  Abdomen: Flat, soft with nl bowel sounds. Nontender, no guarding, rebound, hernias, masses, or organomegaly.  Lymphatics: Non tender without lymphadenopathy.  Genitourinary: No hernias.Testes nl. DRE - prostate nl for age - smooth & firm w/o nodules. Musculoskeletal: Full ROM all peripheral extremities, joint stability, 5/5 strength, and normal gait. Skin: Warm and dry without rashes, lesions, cyanosis, clubbing or  ecchymosis.  Neuro: Cranial  nerves intact, reflexes equal bilaterally. Normal muscle tone, no cerebellar symptoms. Sensation intact to vibratory and Monofilament testing to the toes bilaterally.  Pysch: Alert and oriented X 3 with normal affect, insight and judgment appropriate.   Cognitive Testing  Alert? Yes  Normal Appearance? Yes  Oriented to person? Yes  Place? Yes   Time? Yes  Recall of three objects?  Yes  Can perform simple calculations? Yes  Displays appropriate judgment? Yes  Can read the correct time from a watch/clock? Yes  Medicare Attestation I have personally reviewed: The patient's medical and social history Their use of alcohol, tobacco or illicit drugs Their current medications  and supplements The patient's functional ability including ADLs,fall risks, home safety risks, cognitive, and hearing and visual impairment Diet and physical activities Evidence for depression or mood disorders  The patient's weight, height, BMI, and visual acuity have been recorded in the chart.  I have made referrals, counseling, and provided education to the patient based on review of the above and I have provided the patient with a written personalized care plan for preventive services.  Over 40 minutes of exam, counseling, chart review was performed.  Kordae Buonocore DAVID, MD   04/05/2015

## 2015-04-04 NOTE — Patient Instructions (Signed)

## 2015-04-05 ENCOUNTER — Ambulatory Visit (INDEPENDENT_AMBULATORY_CARE_PROVIDER_SITE_OTHER): Payer: Medicare Other | Admitting: Internal Medicine

## 2015-04-05 ENCOUNTER — Encounter: Payer: Self-pay | Admitting: Internal Medicine

## 2015-04-05 VITALS — BP 152/80 | HR 64 | Temp 97.3°F | Resp 16 | Ht 68.0 in | Wt 206.0 lb

## 2015-04-05 DIAGNOSIS — E559 Vitamin D deficiency, unspecified: Secondary | ICD-10-CM | POA: Diagnosis not present

## 2015-04-05 DIAGNOSIS — Z940 Kidney transplant status: Secondary | ICD-10-CM | POA: Diagnosis not present

## 2015-04-05 DIAGNOSIS — M1000 Idiopathic gout, unspecified site: Secondary | ICD-10-CM | POA: Diagnosis not present

## 2015-04-05 DIAGNOSIS — I480 Paroxysmal atrial fibrillation: Secondary | ICD-10-CM | POA: Diagnosis not present

## 2015-04-05 DIAGNOSIS — Z0001 Encounter for general adult medical examination with abnormal findings: Secondary | ICD-10-CM

## 2015-04-05 DIAGNOSIS — N189 Chronic kidney disease, unspecified: Secondary | ICD-10-CM

## 2015-04-05 DIAGNOSIS — R6889 Other general symptoms and signs: Secondary | ICD-10-CM | POA: Diagnosis not present

## 2015-04-05 DIAGNOSIS — E785 Hyperlipidemia, unspecified: Secondary | ICD-10-CM

## 2015-04-05 DIAGNOSIS — Z7901 Long term (current) use of anticoagulants: Secondary | ICD-10-CM

## 2015-04-05 DIAGNOSIS — E1022 Type 1 diabetes mellitus with diabetic chronic kidney disease: Secondary | ICD-10-CM | POA: Diagnosis not present

## 2015-04-05 DIAGNOSIS — I25708 Atherosclerosis of coronary artery bypass graft(s), unspecified, with other forms of angina pectoris: Secondary | ICD-10-CM

## 2015-04-05 DIAGNOSIS — I10 Essential (primary) hypertension: Secondary | ICD-10-CM | POA: Diagnosis not present

## 2015-04-05 DIAGNOSIS — Z79899 Other long term (current) drug therapy: Secondary | ICD-10-CM

## 2015-04-05 DIAGNOSIS — Z0000 Encounter for general adult medical examination without abnormal findings: Secondary | ICD-10-CM

## 2015-04-05 LAB — CBC WITH DIFFERENTIAL/PLATELET
BASOS PCT: 1 % (ref 0–1)
Basophils Absolute: 0 10*3/uL (ref 0.0–0.1)
EOS ABS: 0.1 10*3/uL (ref 0.0–0.7)
Eosinophils Relative: 2 % (ref 0–5)
HCT: 41.1 % (ref 39.0–52.0)
Hemoglobin: 13.9 g/dL (ref 13.0–17.0)
Lymphocytes Relative: 29 % (ref 12–46)
Lymphs Abs: 1.1 10*3/uL (ref 0.7–4.0)
MCH: 29.6 pg (ref 26.0–34.0)
MCHC: 33.8 g/dL (ref 30.0–36.0)
MCV: 87.4 fL (ref 78.0–100.0)
MONO ABS: 0.3 10*3/uL (ref 0.1–1.0)
MONOS PCT: 9 % (ref 3–12)
MPV: 9 fL (ref 8.6–12.4)
Neutro Abs: 2.2 10*3/uL (ref 1.7–7.7)
Neutrophils Relative %: 59 % (ref 43–77)
PLATELETS: 120 10*3/uL — AB (ref 150–400)
RBC: 4.7 MIL/uL (ref 4.22–5.81)
RDW: 13.6 % (ref 11.5–15.5)
WBC: 3.8 10*3/uL — ABNORMAL LOW (ref 4.0–10.5)

## 2015-04-06 ENCOUNTER — Other Ambulatory Visit: Payer: Self-pay | Admitting: Internal Medicine

## 2015-04-06 LAB — HEPATIC FUNCTION PANEL
ALT: 24 U/L (ref 9–46)
AST: 24 U/L (ref 10–35)
Albumin: 4.2 g/dL (ref 3.6–5.1)
Alkaline Phosphatase: 69 U/L (ref 40–115)
BILIRUBIN DIRECT: 0.2 mg/dL (ref <=0.2)
BILIRUBIN INDIRECT: 1 mg/dL (ref 0.2–1.2)
BILIRUBIN TOTAL: 1.2 mg/dL (ref 0.2–1.2)
Total Protein: 6.4 g/dL (ref 6.1–8.1)

## 2015-04-06 LAB — PROTIME-INR
INR: 1.87 — AB (ref <1.50)
PROTHROMBIN TIME: 21.8 s — AB (ref 11.6–15.2)

## 2015-04-06 LAB — HEMOGLOBIN A1C
HEMOGLOBIN A1C: 6.7 % — AB (ref <5.7)
Mean Plasma Glucose: 146 mg/dL — ABNORMAL HIGH (ref <117)

## 2015-04-06 LAB — LIPID PANEL
CHOLESTEROL: 174 mg/dL (ref 125–200)
HDL: 61 mg/dL (ref >=40)
LDL Cholesterol: 96 mg/dL (ref <130)
Total CHOL/HDL Ratio: 2.9 Ratio (ref <=5.0)
Triglycerides: 86 mg/dL (ref <150)
VLDL: 17 mg/dL (ref <30)

## 2015-04-06 LAB — BASIC METABOLIC PANEL WITH GFR
BUN: 17 mg/dL (ref 7–25)
CALCIUM: 9.4 mg/dL (ref 8.6–10.3)
CO2: 26 mmol/L (ref 20–31)
CREATININE: 1.05 mg/dL (ref 0.70–1.25)
Chloride: 103 mmol/L (ref 98–110)
GFR, EST NON AFRICAN AMERICAN: 73 mL/min (ref >=60)
GFR, Est African American: 84 mL/min (ref >=60)
Glucose, Bld: 135 mg/dL — ABNORMAL HIGH (ref 65–99)
Potassium: 4.4 mmol/L (ref 3.5–5.3)
Sodium: 139 mmol/L (ref 135–146)

## 2015-04-06 LAB — MAGNESIUM: MAGNESIUM: 1.5 mg/dL (ref 1.5–2.5)

## 2015-04-06 LAB — URIC ACID: URIC ACID, SERUM: 7.2 mg/dL (ref 4.0–7.8)

## 2015-04-06 LAB — VITAMIN D 25 HYDROXY (VIT D DEFICIENCY, FRACTURES): VIT D 25 HYDROXY: 70 ng/mL (ref 30–100)

## 2015-04-06 LAB — TSH: TSH: 1.217 u[IU]/mL (ref 0.350–4.500)

## 2015-04-06 LAB — INSULIN, RANDOM: INSULIN: 14.1 u[IU]/mL (ref 2.0–19.6)

## 2015-04-12 ENCOUNTER — Other Ambulatory Visit: Payer: Self-pay | Admitting: *Deleted

## 2015-04-12 ENCOUNTER — Encounter: Payer: Self-pay | Admitting: Internal Medicine

## 2015-04-12 MED ORDER — HYDROXYZINE PAMOATE 25 MG PO CAPS: ORAL_CAPSULE | ORAL | Status: DC

## 2015-06-05 ENCOUNTER — Encounter: Payer: Self-pay | Admitting: Internal Medicine

## 2015-06-05 ENCOUNTER — Ambulatory Visit (INDEPENDENT_AMBULATORY_CARE_PROVIDER_SITE_OTHER): Payer: Medicare Other | Admitting: Internal Medicine

## 2015-06-05 VITALS — BP 142/80 | HR 70 | Temp 97.8°F | Resp 16 | Ht 68.0 in | Wt 205.0 lb

## 2015-06-05 DIAGNOSIS — I480 Paroxysmal atrial fibrillation: Secondary | ICD-10-CM | POA: Diagnosis not present

## 2015-06-05 DIAGNOSIS — Z23 Encounter for immunization: Secondary | ICD-10-CM

## 2015-06-05 DIAGNOSIS — Z7901 Long term (current) use of anticoagulants: Secondary | ICD-10-CM | POA: Diagnosis not present

## 2015-06-05 LAB — PROTIME-INR
INR: 1.93 — AB (ref <1.50)
PROTHROMBIN TIME: 22.3 s — AB (ref 11.6–15.2)

## 2015-06-05 NOTE — Progress Notes (Signed)
Patient ID: Robert Arnold, male   DOB: 07-27-1945, 70 y.o.   MRN: [id number]  Assessment and Plan:   1. PAF (paroxysmal atrial fibrillation) (HCC) -cont coumadin - Protime-INR  2. Long term current use of anticoagulant therapy -cont coumadin - Protime-INR  3. Need for prophylactic vaccination with tetanus-diphtheria (TD)  - DT Vaccine greater than 7yo IM  4. Need for prophylactic vaccination against Streptococcus pneumoniae (pneumococcus)  - Pneumococcal polysaccharide vaccine 23-valent greater than or equal to 2yo subcutaneous/IM     HPI 70 y.o.male presents for 1 month follow up of PT/INR for PAF. Patient reports that they have been doing well.  male is taking their medication.  They are having difficulty with their medications.  They report no adverse reactions.  No abx recently, no issues with falls, hasn't hit head, hasn't had any bleeding issues with nose bleeds, no blood in stool, no melena.    Past Medical History  Diagnosis Date  . Diabetes mellitus type 2, controlled ([place])   . Hyperlipidemia   . BPH (benign prostatic hyperplasia)   . Gout   . CAD (coronary artery disease)     s/p post-op NSTEMI. treated with stent [facility] LAD. [facility]  . PAF (paroxysmal atrial fibrillation) ([place])     s/p DC-CV in 6/13. Off coumadin due to ureteral bleed  . OSA (obstructive sleep apnea)   . 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 HTN (hypertension)   . MI (myocardial infarction) ([place]) 09/26/2011  . Chronic kidney disease     due IgA nephropathy - s/p kidnet transplant 09/25/11     Allergies  Allergen Reactions  . Crestor [Rosuvastatin]     Elevated LFT's  . Lorazepam   . Losartan       Current Outpatient Prescriptions on File Prior to Visit  Medication Sig Dispense Refill  . aspirin EC 81 MG tablet Take 81 mg by mouth daily.    . betamethasone dipropionate (DIPROLENE) 0.05 % cream Apply topically 2 (two) times daily. 45 g 5  . buPROPion  (WELLBUTRIN XL) 300 MG 24 hr tablet TAKE 1 TABLET BY MOUTH EVERY MORNING. 90 tablet 1  . Cholecalciferol (VITAMIN D PO) Take 1,000 Int'l Units by mouth daily.     . finasteride (PROSCAR) 5 MG tablet TAKE 1 TABLET BY MOUTH DAILY 90 tablet 3  . FREESTYLE TEST STRIPS test strip CHECK BLOOD SUGAR 3-4 TIMES A DAY FOR MEDICATION ADJUSTMENT 450 each PRN  . hydrOXYzine (VISTARIL) 25 MG capsule TAKE 1-2 CAPSULES BY MOUTH 2-4 TIMES DAILY AS NEEDED FOR ANXIETY 120 capsule 11  . hyoscyamine (LEVSIN/SL) 0.125 MG SL tablet Place 1 tablet (0.125 mg total) under the tongue every 4 (four) hours as needed. 30 tablet 2  . insulin NPH-regular Human (NOVOLIN 70/30) (70-30) 100 UNIT/ML injection Take 20-30 units 2 x day or as directed 10 mL 99  . Insulin Pen Needle 31G X 5 MM MISC BD ultra fine pen needles.  Patient checks glucose 2 times daily.  DX-250.01 50 each 11  . Mycophenolate Sodium (MYCOPHENOLIC ACID) [id number] MG TBEC TAKE 1 TABLET BY MOUTH TWICE DAILY 60 tablet 99  . pravastatin (PRAVACHOL) 40 MG tablet TAKE 1 TABLET BY MOUTH EVERY DAY 90 tablet 1  . tacrolimus (PROGRAF) 0.5 MG capsule Take 0.5 mg by mouth 2 (two) times daily.   11  . tacrolimus (PROGRAF) 1 MG capsule Take 1 mg by mouth 2 (two) times daily.   2  .  TRAVATAN Z 0.004 % SOLN ophthalmic solution Place 1 drop into both eyes at bedtime.     Marland Kitchen warfarin (COUMADIN) 5 MG tablet TAKE 1-2 TABLETS BY MOUTH DAILY AS DIRECTED 180 tablet PRN   No current facility-administered medications on file prior to visit.    ROS: all negative except above.   Physical Exam: Filed Weights   06/05/15 0934  Weight: 205 lb (92.987 kg)   BP 142/80 mmHg  Pulse 70  Temp(Src) 97.8 F (36.6 C) (Temporal)  Resp 16  Ht 5\' 8"  (1.727 m)  Wt 205 lb (92.987 kg)  BMI 31.18 kg/m2 General Appearance: Well developed well nourished, non-toxic appearing in no apparent distress. Eyes: PERRLA, EOMs, conjunctiva w/ no swelling or erythema or discharge Sinuses: No Frontal/maxillary  tenderness ENT/Mouth: Ear canals clear without swelling or erythema.  TM's normal bilaterally with no retractions, bulging, or loss of landmarks.   Neck: Supple, thyroid normal, no notable JVD  Respiratory: Respiratory effort normal, Clear breath sounds anteriorly and posteriorly bilaterally without rales, rhonchi, wheezing or stridor. No retractions or accessory muscle usage. Cardio: RRR with no MRGs.   Abdomen: Soft, + BS.  Non tender, no guarding, rebound, hernias, masses.  Musculoskeletal: Full ROM, 5/5 strength, normal gait.  Skin: Warm, dry without rashes  Neuro: Awake and oriented X 3, Cranial nerves intact. Normal muscle tone, no cerebellar symptoms. Sensation intact.  Psych: normal affect, Insight and Judgment appropriate.     [name], PA-C 10:09 AM [facility]

## 2015-07-12 ENCOUNTER — Other Ambulatory Visit: Payer: Self-pay | Admitting: Internal Medicine

## 2015-07-27 ENCOUNTER — Other Ambulatory Visit: Payer: Self-pay | Admitting: Internal Medicine

## 2015-08-09 ENCOUNTER — Encounter: Payer: Self-pay | Admitting: Internal Medicine

## 2015-08-09 ENCOUNTER — Ambulatory Visit: Payer: Medicare Other | Admitting: Internal Medicine

## 2015-08-09 VITALS — BP 142/70 | HR 64 | Temp 97.0°F | Resp 16 | Ht 68.0 in | Wt 207.4 lb

## 2015-08-09 DIAGNOSIS — E1022 Type 1 diabetes mellitus with diabetic chronic kidney disease: Secondary | ICD-10-CM

## 2015-08-09 DIAGNOSIS — E559 Vitamin D deficiency, unspecified: Secondary | ICD-10-CM

## 2015-08-09 DIAGNOSIS — N182 Chronic kidney disease, stage 2 (mild): Secondary | ICD-10-CM

## 2015-08-09 DIAGNOSIS — Z7901 Long term (current) use of anticoagulants: Secondary | ICD-10-CM

## 2015-08-09 DIAGNOSIS — E785 Hyperlipidemia, unspecified: Secondary | ICD-10-CM

## 2015-08-09 DIAGNOSIS — I10 Essential (primary) hypertension: Secondary | ICD-10-CM

## 2015-08-09 DIAGNOSIS — Z79899 Other long term (current) drug therapy: Secondary | ICD-10-CM

## 2015-08-09 LAB — CBC WITH DIFFERENTIAL/PLATELET
BASOS ABS: 41 {cells}/uL (ref 0–200)
Basophils Relative: 1 %
EOS ABS: 82 {cells}/uL (ref 15–500)
Eosinophils Relative: 2 %
HCT: 43.2 % (ref 38.5–50.0)
HEMOGLOBIN: 14.6 g/dL (ref 13.2–17.1)
LYMPHS ABS: 1066 {cells}/uL (ref 850–3900)
Lymphocytes Relative: 26 %
MCH: 29.9 pg (ref 27.0–33.0)
MCHC: 33.8 g/dL (ref 32.0–36.0)
MCV: 88.3 fL (ref 80.0–100.0)
MONO ABS: 410 {cells}/uL (ref 200–950)
MONOS PCT: 10 %
MPV: 9.2 fL (ref 7.5–12.5)
NEUTROS PCT: 61 %
Neutro Abs: 2501 cells/uL (ref 1500–7800)
PLATELETS: 126 10*3/uL — AB (ref 140–400)
RBC: 4.89 MIL/uL (ref 4.20–5.80)
RDW: 13.4 % (ref 11.0–15.0)
WBC: 4.1 10*3/uL (ref 3.8–10.8)

## 2015-08-09 LAB — MAGNESIUM: MAGNESIUM: 1.4 mg/dL — AB (ref 1.5–2.5)

## 2015-08-09 LAB — HEMOGLOBIN A1C
HEMOGLOBIN A1C: 6.8 % — AB (ref <5.7)
Mean Plasma Glucose: 148 mg/dL

## 2015-08-09 LAB — BASIC METABOLIC PANEL WITH GFR
BUN: 19 mg/dL (ref 7–25)
CHLORIDE: 102 mmol/L (ref 98–110)
CO2: 25 mmol/L (ref 20–31)
CREATININE: 1.16 mg/dL (ref 0.70–1.25)
Calcium: 9.7 mg/dL (ref 8.6–10.3)
GFR, Est African American: 75 mL/min (ref >=60)
GFR, Est Non African American: 65 mL/min (ref >=60)
Glucose, Bld: 155 mg/dL — ABNORMAL HIGH (ref 65–99)
Potassium: 4.5 mmol/L (ref 3.5–5.3)
Sodium: 138 mmol/L (ref 135–146)

## 2015-08-09 LAB — LIPID PANEL
Cholesterol: 190 mg/dL (ref 125–200)
HDL: 62 mg/dL (ref >=40)
LDL Cholesterol: 107 mg/dL (ref <130)
Total CHOL/HDL Ratio: 3.1 Ratio (ref <=5.0)
Triglycerides: 106 mg/dL (ref <150)
VLDL: 21 mg/dL (ref <30)

## 2015-08-09 LAB — TSH: TSH: 2.17 m[IU]/L (ref 0.40–4.50)

## 2015-08-09 LAB — HEPATIC FUNCTION PANEL
ALBUMIN: 4.2 g/dL (ref 3.6–5.1)
ALT: 17 U/L (ref 9–46)
AST: 22 U/L (ref 10–35)
Alkaline Phosphatase: 78 U/L (ref 40–115)
Bilirubin, Direct: 0.3 mg/dL — ABNORMAL HIGH (ref <=0.2)
Indirect Bilirubin: 1.3 mg/dL — ABNORMAL HIGH (ref 0.2–1.2)
TOTAL PROTEIN: 6.7 g/dL (ref 6.1–8.1)
Total Bilirubin: 1.6 mg/dL — ABNORMAL HIGH (ref 0.2–1.2)

## 2015-08-09 LAB — PROTIME-INR
INR: 1.65 — ABNORMAL HIGH (ref <1.50)
Prothrombin Time: 19.7 seconds — ABNORMAL HIGH (ref 11.6–15.2)

## 2015-08-09 NOTE — Progress Notes (Signed)
Patient ID: Robert Arnold, male   DOB: November 21, 1945, 70 y.o.   MRN: [id number]   After labs done,   Patient re-scheduled OV

## 2015-08-09 NOTE — Patient Instructions (Signed)

## 2015-08-10 LAB — VITAMIN D 25 HYDROXY (VIT D DEFICIENCY, FRACTURES): VIT D 25 HYDROXY: 72 ng/mL (ref 30–100)

## 2015-08-11 ENCOUNTER — Other Ambulatory Visit: Payer: Self-pay | Admitting: Internal Medicine

## 2015-08-24 ENCOUNTER — Encounter: Payer: Self-pay | Admitting: Gastroenterology

## 2015-08-24 ENCOUNTER — Ambulatory Visit (INDEPENDENT_AMBULATORY_CARE_PROVIDER_SITE_OTHER): Payer: Medicare Other | Admitting: Internal Medicine

## 2015-08-24 ENCOUNTER — Ambulatory Visit: Payer: Self-pay | Admitting: Internal Medicine

## 2015-08-24 ENCOUNTER — Encounter: Payer: Self-pay | Admitting: Internal Medicine

## 2015-08-24 VITALS — BP 128/74 | HR 72 | Temp 97.8°F | Resp 16 | Ht 68.0 in | Wt 207.8 lb

## 2015-08-24 DIAGNOSIS — N182 Chronic kidney disease, stage 2 (mild): Secondary | ICD-10-CM

## 2015-08-24 DIAGNOSIS — E559 Vitamin D deficiency, unspecified: Secondary | ICD-10-CM

## 2015-08-24 DIAGNOSIS — E1022 Type 1 diabetes mellitus with diabetic chronic kidney disease: Secondary | ICD-10-CM | POA: Diagnosis not present

## 2015-08-24 DIAGNOSIS — E785 Hyperlipidemia, unspecified: Secondary | ICD-10-CM | POA: Diagnosis not present

## 2015-08-24 DIAGNOSIS — I10 Essential (primary) hypertension: Secondary | ICD-10-CM | POA: Diagnosis not present

## 2015-08-24 DIAGNOSIS — Z79899 Other long term (current) drug therapy: Secondary | ICD-10-CM

## 2015-08-24 DIAGNOSIS — I25708 Atherosclerosis of coronary artery bypass graft(s), unspecified, with other forms of angina pectoris: Secondary | ICD-10-CM

## 2015-08-24 DIAGNOSIS — Z7901 Long term (current) use of anticoagulants: Secondary | ICD-10-CM | POA: Diagnosis not present

## 2015-08-24 NOTE — Progress Notes (Signed)
Patient ID: Robert Arnold, male   DOB: [birth year], 70 y.o.   MRN: [id number]  [facility]                       [name], M.D.        [name]. [name], P.A.-C       [name], P.A.-C  [facility]                [address] [place], [place] [id number] Telephone [phone] Telefax [phone] _____________________________________________________________________________________________________________________________________   This very nice 70 y.o. MWM presents for 6 month follow up with Hypertension, Hyperlipidemia, Pre-Diabetes and Vitamin D Deficiency.    Patient is treated for HTN circa 1998  & BP has been controlled at home. Today's BP: 128/74 mmHg. 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 In June 2013 he received a living donor Kidney Transplant and had an acute MI post-op and had PTCA/ stenting. Then in Nov 2013 he underwent CABG and has done well to present. Patient does have cAfib & is on Coumadin therapy. Patient has had no complaints of any cardiac type chest pain, palpitations, dyspnea/orthopnea/PND, dizziness, claudication, or dependent edema.   Hyperlipidemia is controlled with diet & meds. Patient denies myalgias or other med SE's. Last Lipids were 08/09/2015: Cholesterol 190; HDL 62; LDL Cholesterol 107; Triglycerides 106   Also, the patient has history of Insulin requiring T2_DM and has had no symptoms of reactive hypoglycemia, diabetic polys, paresthesias or visual blurring. He was dx'd w/T2_DM in 2003 and maintained on diet until 2007 when started on Metformin and then post Kidney Transplant was switched to Insulin.  Last A1c was  6.8% on 08/09/2015.   Further, the patient also has history of Vitamin D Deficiency and supplements vitamin D without any suspected side-effects. Last vitamin D was 72 on 08/09/2015.  Medication Sig  .  aspirin EC 81 MG tablet Take 81 mg by mouth daily.  Marland Kitchen DIPROLENE 0.05 % cream Apply topically 2 (two) times daily.  Marland Kitchen buPROPion XL 300 MG 24 hr tablet TAKE 1 TABLET BY MOUTH EVERY MORNING.  . VITAMIN D Take 1,000 Int'l Units by mouth daily.   . finasteride 5 MG tablet TAKE 1 TABLET BY MOUTH DAILY  . hydrOXYzine ( 25 MG capsule TAKE 1-2 CAPSULES BY MOUTH 2-4 TIMES DAILY AS NEEDED FOR ANXIETY  . NOVOLIN 70/30 Take 20-30 units 2 x day or as directed  . mycophenolate (MYFORTIC) 360 MG TBEC EC tablet TAKE 1 TABLET BY MOUTH TWICE DAILY  . pravastatin  40 MG tablet TAKE 1 TABLET BY MOUTH EVERY DAY  . tacrolimus  0.5 MG capsule Take 0.5 mg by mouth 2 (two) times daily.   . tacrolimus  1 MG capsule Take 1 mg by mouth 2 (two) times daily.   [name]  SOLN ophth soln Place 1 drop into both eyes at bedtime.   Marland Kitchen warfarin  5 MG tablet TAKE 1-2 TABLETS BY MOUTH DAILY AS DIRECTED  . Calcium Carb-Ergocalciferol 250-125 MG-UNIT TABS Take by mouth.  . EPOGEN 2000 UNIT/ML injection Inject into the skin.  . ferrous sulfate 325  MG tablet Take by mouth.  . Hyoscyamine/SL  0.125 MG SL tablet Place 1 tablet (0.125 mg total) under the tongue every 4 (four) hours as needed.  Allergies  Allergen Reactions  . Crestor [Rosuvastatin]     Elevated LFT's  . Lorazepam   . Losartan    PMHx:   Past Medical History  Diagnosis Date  . Diabetes mellitus type 2, controlled ([place])   . Hyperlipidemia   . BPH (benign prostatic hyperplasia)   . Gout   . CAD (coronary artery disease)     s/p post-op NSTEMI. treated with stent to LAD. at [facility]  . PAF (paroxysmal atrial fibrillation) ([place])     s/p DC-CV in 6/13. Off coumadin due to ureteral bleed  . OSA (obstructive sleep apnea)   . 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 HTN (hypertension)   . MI (myocardial infarction) ([place]) 09/26/2011  . Chronic kidney disease     due IgA nephropathy - s/p kidnet transplant 09/25/11   Immunization  History  Administered Date(s) Administered  . DT 06/05/2015  . DTaP 04/08/2008  . Hepatitis B 04/08/2009  . Influenza, High Dose Seasonal PF 12/27/2013, 01/17/2015  . Pneumococcal Conjugate-13 10/25/2013  . Pneumococcal Polysaccharide-23 06/05/2015  . Pneumococcal-Unspecified 04/08/2008   Past Surgical History  Procedure Laterality Date  . Colonoscopy    . Peritoneal catheter insertion  2011  . Av fistula placement  2011    Left forearm  . Kidney transplant  09/25/2011  . Coronary angioplasty with stent placement     FHx:    Reviewed / unchanged  SHx:    Reviewed / unchanged  Systems Review:  Constitutional: Denies fever, chills, wt changes, headaches, insomnia, fatigue, night sweats, change in appetite. Eyes: Denies redness, blurred vision, diplopia, discharge, itchy, watery eyes.  ENT: Denies discharge, congestion, post nasal drip, epistaxis, sore throat, earache, hearing loss, dental pain, tinnitus, vertigo, sinus pain, snoring.  CV: Denies chest pain, palpitations, irregular heartbeat, syncope, dyspnea, diaphoresis, orthopnea, PND, claudication or edema. Respiratory: denies cough, dyspnea, DOE, pleurisy, hoarseness, laryngitis, wheezing.  Gastrointestinal: Denies dysphagia, odynophagia, heartburn, reflux, water brash, abdominal pain or cramps, nausea, vomiting, bloating, diarrhea, constipation, hematemesis, melena, hematochezia  or hemorrhoids. Genitourinary: Denies dysuria, frequency, urgency, nocturia, hesitancy, discharge, hematuria or flank pain. Musculoskeletal: Denies arthralgias, myalgias, stiffness, jt. swelling, pain, limping or strain/sprain.  Skin: Denies pruritus, rash, hives, warts, acne, eczema or change in skin lesion(s). Neuro: No weakness, tremor, incoordination, spasms, paresthesia or pain. Psychiatric: Denies confusion, memory loss or sensory loss. Endo: Denies change in weight, skin or hair change.  Heme/Lymph: No excessive bleeding, bruising or enlarged  lymph nodes.  Physical Exam  BP 128/74 mmHg  Pulse 72  Temp(Src) 97.8 F (36.6 C)  Resp 16  Ht 5\' 8"  (1.727 m)  Wt 207 lb 12.8 oz (94.257 kg)  BMI 31.60 kg/m2  Appears well nourished and in no distress. Eyes: PERRLA, EOMs, conjunctiva no swelling or erythema. Sinuses: No frontal/maxillary tenderness ENT/Mouth: EAC's clear, TM's nl w/o erythema, bulging. Nares clear w/o erythema, swelling, exudates. Oropharynx clear without erythema or exudates. Oral hygiene is good. Tongue normal, non obstructing. Hearing intact.  Neck: Supple. Thyroid nl. Car 2+/2+ without bruits, nodes or JVD. Chest: Respirations nl with BS clear & equal w/o rales, rhonchi, wheezing or stridor.  Cor: Heart sounds normal w/ regular rate and rhythm without sig. murmurs, gallops, clicks, or rubs. Peripheral pulses normal and equal  without edema.  Abdomen: Soft & bowel sounds normal. Non-tender w/o guarding, rebound, hernias, masses, or organomegaly.  Lymphatics: Unremarkable.  Musculoskeletal: Full ROM all peripheral extremities, joint stability, 5/5 strength, and normal gait.  Skin: Warm, dry without exposed rashes, lesions or ecchymosis apparent.  Neuro: Cranial nerves intact, reflexes equal bilaterally. Sensory-motor testing grossly intact. Tendon reflexes grossly intact.  Pysch: Alert & oriented x 3.  Insight and judgement nl & appropriate. No ideations.  Assessment and Plan:  1. Essential hypertension  2. Hyperlipidemia  3. 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 Vitamin D deficiency  5. Coronary artery disease involving coronary bypass graft with other forms of angina pectoris ([place])  6. Long term current use of anticoagulant therapy  7. Medication management  8. Chronic kidney disease, stage 2 (mild) (s/p living donor transplant)    Recommended regular exercise, BP monitoring, weight control, and discussed med and SE's. Reviewed recent  labs with patient.  Long  discussion with patient assuming more responsibility for his Diabetic diet.  Over 30 minutes of exam, counseling, chart review was performed

## 2015-08-24 NOTE — Patient Instructions (Signed)

## 2015-08-25 ENCOUNTER — Other Ambulatory Visit: Payer: Self-pay | Admitting: Internal Medicine

## 2015-08-25 ENCOUNTER — Encounter: Payer: Self-pay | Admitting: Internal Medicine

## 2015-08-25 DIAGNOSIS — E785 Hyperlipidemia, unspecified: Secondary | ICD-10-CM

## 2015-08-25 MED ORDER — ATORVASTATIN CALCIUM 80 MG PO TABS: ORAL_TABLET | ORAL | Status: DC

## 2015-09-06 ENCOUNTER — Other Ambulatory Visit: Payer: Self-pay | Admitting: Internal Medicine

## 2015-09-06 ENCOUNTER — Encounter: Payer: Self-pay | Admitting: Internal Medicine

## 2015-09-06 DIAGNOSIS — I482 Chronic atrial fibrillation, unspecified: Secondary | ICD-10-CM

## 2015-09-06 MED ORDER — WARFARIN SODIUM 5 MG PO TABS: ORAL_TABLET | ORAL | Status: DC

## 2015-09-26 ENCOUNTER — Encounter: Payer: Self-pay | Admitting: Physician Assistant

## 2015-09-26 ENCOUNTER — Ambulatory Visit (INDEPENDENT_AMBULATORY_CARE_PROVIDER_SITE_OTHER): Payer: Medicare Other | Admitting: Physician Assistant

## 2015-09-26 VITALS — BP 128/70 | HR 63 | Temp 97.9°F | Resp 14 | Ht 68.0 in | Wt 205.8 lb

## 2015-09-26 DIAGNOSIS — I5022 Chronic systolic (congestive) heart failure: Secondary | ICD-10-CM

## 2015-09-26 DIAGNOSIS — I214 Non-ST elevation (NSTEMI) myocardial infarction: Secondary | ICD-10-CM

## 2015-09-26 DIAGNOSIS — E785 Hyperlipidemia, unspecified: Secondary | ICD-10-CM

## 2015-09-26 DIAGNOSIS — N182 Chronic kidney disease, stage 2 (mild): Secondary | ICD-10-CM | POA: Diagnosis not present

## 2015-09-26 DIAGNOSIS — I480 Paroxysmal atrial fibrillation: Secondary | ICD-10-CM | POA: Diagnosis not present

## 2015-09-26 DIAGNOSIS — D696 Thrombocytopenia, unspecified: Secondary | ICD-10-CM | POA: Diagnosis not present

## 2015-09-26 DIAGNOSIS — Z7901 Long term (current) use of anticoagulants: Secondary | ICD-10-CM

## 2015-09-26 DIAGNOSIS — E1022 Type 1 diabetes mellitus with diabetic chronic kidney disease: Secondary | ICD-10-CM

## 2015-09-26 LAB — COMPREHENSIVE METABOLIC PANEL
ALBUMIN: 4.2 g/dL (ref 3.6–5.1)
ALK PHOS: 78 U/L (ref 40–115)
ALT: 19 U/L (ref 9–46)
AST: 22 U/L (ref 10–35)
BILIRUBIN TOTAL: 1.3 mg/dL — AB (ref 0.2–1.2)
BUN: 19 mg/dL (ref 7–25)
CALCIUM: 9.4 mg/dL (ref 8.6–10.3)
CO2: 26 mmol/L (ref 20–31)
Chloride: 102 mmol/L (ref 98–110)
Creat: 1.16 mg/dL (ref 0.70–1.25)
Glucose, Bld: 143 mg/dL — ABNORMAL HIGH (ref 65–99)
Potassium: 4.3 mmol/L (ref 3.5–5.3)
Sodium: 139 mmol/L (ref 135–146)
Total Protein: 6.6 g/dL (ref 6.1–8.1)

## 2015-09-26 LAB — LIPID PANEL
CHOL/HDL RATIO: 2.7 ratio (ref <=5.0)
CHOLESTEROL: 164 mg/dL (ref 125–200)
HDL: 61 mg/dL (ref >=40)
LDL Cholesterol: 84 mg/dL (ref <130)
Triglycerides: 96 mg/dL (ref <150)
VLDL: 19 mg/dL (ref <30)

## 2015-09-26 NOTE — Progress Notes (Signed)
Coumadin follow up  Patient is on Coumadin for Primary Diagnosis: PAF (paroxysmal atrial fibrillation) ([place]) [I48.0] Patient's last INR is  Lab Results  Component Value Date   INR 1.65* 08/09/2015   INR 1.93* 06/05/2015   INR 1.87* 04/05/2015    Patient denies SOB, CP, dizziness, nose bleeds, easy bleeding, and blood in stool/urine. He is on 10mg  T,T,,S and 7.5mg  the rest.  His coumadin dose was changed last visit. He has not taken ABX, has not missed any doses and denies a fall.   Also last visit he was switched to lipitor 1/2 pill, will check LFTs and chol BMI is Body mass index is 31.3 kg/(m^2)., he is working on diet and exercise, denies PND, orthopnea, weight is down/stable. He has DM with CKD, states sugars have been controlled at home, denies hypoglycemia.  Wt Readings from Last 3 Encounters:  09/26/15 205 lb 12.8 oz (93.35 kg)  08/24/15 207 lb 12.8 oz (94.257 kg)  08/09/15 207 lb 6.4 oz (94.076 kg)     Current Outpatient Prescriptions on File Prior to Visit  Medication Sig Dispense Refill  . aspirin EC 81 MG tablet Take 81 mg by mouth daily.    Marland Kitchen atorvastatin (LIPITOR) 80 MG tablet Take 1/2 to 1 tablet daily or as directed for cholesterol 90 tablet 1  . betamethasone dipropionate (DIPROLENE) 0.05 % cream Apply topically 2 (two) times daily. 45 g 5  . buPROPion (WELLBUTRIN XL) 300 MG 24 hr tablet TAKE 1 TABLET BY MOUTH EVERY MORNING. 30 tablet 3  . Cholecalciferol (VITAMIN D PO) Take 1,000 Int'l Units by mouth daily.     . finasteride (PROSCAR) 5 MG tablet TAKE 1 TABLET BY MOUTH DAILY 90 tablet 3  . FREESTYLE TEST STRIPS test strip CHECK BLOOD SUGAR 3-4 TIMES A DAY FOR MEDICATION ADJUSTMENT 450 each PRN  . hydrOXYzine (VISTARIL) 25 MG capsule TAKE 1-2 CAPSULES BY MOUTH 2-4 TIMES DAILY AS NEEDED FOR ANXIETY 120 capsule 11  . insulin NPH-regular Human (NOVOLIN 70/30) (70-30) 100 UNIT/ML injection Take 20-30 units 2 x day or as directed 10 mL 99  . mycophenolate (MYFORTIC) 360 MG  TBEC EC tablet TAKE 1 TABLET BY MOUTH TWICE DAILY 180 tablet 1  . tacrolimus (PROGRAF) 0.5 MG capsule Take 0.5 mg by mouth 2 (two) times daily.   11  . tacrolimus (PROGRAF) 1 MG capsule Take 1 mg by mouth 2 (two) times daily.   2  . TRAVATAN Z 0.004 % SOLN ophthalmic solution Place 1 drop into both eyes at bedtime.     Marland Kitchen warfarin (COUMADIN) 5 MG tablet TAKE 1-2 TABLETS BY MOUTH DAILY AS DIRECTED 180 tablet 1   No current facility-administered medications on file prior to visit.   Past Medical History  Diagnosis Date  . Diabetes mellitus type 2, controlled ([place])   . Hyperlipidemia   . BPH (benign prostatic hyperplasia)   . Gout   . CAD (coronary artery disease)     s/p post-op NSTEMI. treated with stent [facility]AD. at [facility]  . PAF (paroxysmal atrial fibrillation) ([place])     s/p DC-CV in 6/13. Off coumadin due to ureteral bleed  . OSA (obstructive sleep apnea)   . 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 HTN (hypertension)   . MI (myocardial infarction) ([place]) 09/26/2011  . 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 Crestor [Rosuvastatin]  Elevated LFT's  . Lorazepam   . Losartan     ROS Constitutional: Denies fever, chills, headaches, fatigue. Cardio: Denies chest pain, palpitations, irregular heartbeat, syncope, dyspnea, diaphoresis, orthopnea, PND, claudication, edema Respiratory: chronic SOB denies cough, pleurisy, hoarseness, laryngitis, wheezing.  Gastrointestinal: Denies dysphagia, heartburn, reflux, pain, cramps, nausea, diarrhea, constipation, hematemesis, melena, hematochezia Genitourinary: Denies dysuria, frequency, hematuria, flank pain Musculoskeletal: Denies arthralgia, myalgia, stiffness, Jt. Swelling, pain, limp, strain/sprain. Skin: Denies rash, ecchymosis, petechial. Neuro: Denies Weakness, tremor, incoordination, spasms, paresthesia, pain Heme/Lymph: Denies  Excessive bleeding, bruising, enlarged lymph nodes  Physical: Blood pressure 128/70, pulse 63, temperature 97.9 F (36.6 C), temperature source Temporal, resp. rate 14, height 5\' 8"  (1.727 m), weight 205 lb 12.8 oz (93.35 kg). Filed Weights   09/26/15 0941  Weight: 205 lb 12.8 oz (93.35 kg)    General Appearance: Well nourished, in no apparent distress. ENT/Mouth: Nares clear with no erythema, swelling, mucus on turbinates. No ulcers, cracking, on lips. No erythema, swelling, or exudate on post pharynx.  Neck: Supple, thyroid normal.  Respiratory: CTAB, O2 98% RA   Cardio: NSR with PVCs, holosystolic murmur no, rubs or gallops. 1+ edema. Thrill palpated left arm.  Abdomen: Soft, with bowl sounds. Non tender, no guarding, rebound, hernias, masses, or organomegaly.  Skin: Warm, dry without rashes, lesion. + ecchymosis bilateral arms.  Neuro: Unremarkable   Assessment and plan: Chronic anticoagulation- check INR, getting labs done for nephrology, will get INR there and send them to [place] and will adjust medication according to labs.  Discussed if patient falls to immediately contact office or go to ER. Discussed foods that can increase or decrease Coumadin levels. Patient understands to call the office before starting a new medication. Follow up in 4-6 weeks  Obesity with co morbidities - long discussion about weight loss, diet, and exercise  CHF, systolic Weight is stable, monitor weight, avoid salt/fluid restrict  DM 1 with CKD Continue to monitor sugars at home  Hyperlipdemia -continue medications, check lipids, decrease fatty foods, increase activity.   thombocytopenia Monitor CBC, stable at this time    Future Appointments Date Time Provider [place]  11/02/2015 9:45 AM [name], PA-C [facility] None  01/11/2016 10:00 AM [name], MD [facility] None

## 2015-09-27 ENCOUNTER — Other Ambulatory Visit: Payer: Self-pay | Admitting: Internal Medicine

## 2015-09-27 LAB — PROTIME-INR
INR: 6.8 — AB
Prothrombin Time: 66.2 s — ABNORMAL HIGH (ref 9.0–11.5)

## 2015-10-11 ENCOUNTER — Encounter: Payer: Self-pay | Admitting: Physician Assistant

## 2015-10-13 ENCOUNTER — Ambulatory Visit: Payer: Medicare Other | Admitting: Physician Assistant

## 2015-10-13 DIAGNOSIS — Z7901 Long term (current) use of anticoagulants: Secondary | ICD-10-CM

## 2015-10-13 LAB — PROTIME-INR
INR: 5.6 — AB
PROTHROMBIN TIME: 55 s — AB (ref 9.0–11.5)

## 2015-10-13 NOTE — Progress Notes (Signed)
Rechecking INR, patient stopped x 2 days, doing 5mg  4 days a week and 7.5 3 days a week.

## 2015-10-14 ENCOUNTER — Encounter: Payer: Self-pay | Admitting: Physician Assistant

## 2015-10-17 ENCOUNTER — Telehealth: Payer: Self-pay | Admitting: *Deleted

## 2015-10-17 ENCOUNTER — Other Ambulatory Visit: Payer: Self-pay | Admitting: Internal Medicine

## 2015-10-17 ENCOUNTER — Other Ambulatory Visit: Payer: Medicare Other

## 2015-10-17 DIAGNOSIS — I480 Paroxysmal atrial fibrillation: Secondary | ICD-10-CM

## 2015-10-17 DIAGNOSIS — Z7901 Long term (current) use of anticoagulants: Secondary | ICD-10-CM

## 2015-10-17 LAB — PROTIME-INR
INR: 1.4 — ABNORMAL HIGH
Prothrombin Time: 14.4 s — ABNORMAL HIGH (ref 9.0–11.5)

## 2015-10-17 NOTE — Telephone Encounter (Signed)
Patient was advised to stop his Warfarin for 3 days per Dr Melford Aase and to restart at 5 mg daily.  The patient was taking Warfarin 7.5 mg 3 days a week and 10 mg 4 days a week. He will restart the Warfarin at 5 mg this evening.

## 2015-10-18 ENCOUNTER — Telehealth: Payer: Self-pay | Admitting: Internal Medicine

## 2015-10-18 NOTE — Telephone Encounter (Signed)
Tried to call patient regarding coumadin level.  Left message on machine to call back about coumadin level.  LMOM to return call.  Will also send a mychart message about instructions.

## 2015-11-02 ENCOUNTER — Encounter: Payer: Self-pay | Admitting: Internal Medicine

## 2015-11-02 ENCOUNTER — Ambulatory Visit (INDEPENDENT_AMBULATORY_CARE_PROVIDER_SITE_OTHER): Payer: Medicare Other | Admitting: Internal Medicine

## 2015-11-02 VITALS — BP 122/70 | HR 62 | Temp 98.0°F | Resp 16 | Ht 68.0 in | Wt 203.0 lb

## 2015-11-02 DIAGNOSIS — Z7901 Long term (current) use of anticoagulants: Secondary | ICD-10-CM | POA: Diagnosis not present

## 2015-11-02 DIAGNOSIS — I480 Paroxysmal atrial fibrillation: Secondary | ICD-10-CM

## 2015-11-02 LAB — PROTIME-INR
INR: 2 — AB
PROTHROMBIN TIME: 20.7 s — AB (ref 9.0–11.5)

## 2015-11-02 NOTE — Progress Notes (Signed)
Assessment and Plan:   1. PAF (paroxysmal atrial fibrillation) (H[facility]) -cont coumadin -dose adjust prn - Protime-INR  2. Long term current use of anticoagulant therapy -cont coumadin - Protime-INR     HPI 70 y.o.male presents for 1 month follow up of PAF with long term anticoagulation. Patient reports that they have been doing well.  male is taking their medication.  No missed doses.  They are not having difficulty with their medications.  They report no adverse reactions.  He has had no falls, recent abx, melena, hematochezia, epistaxis, or bleeding.  No CP.   Past Medical History:  Diagnosis Date  . BPH (benign prostatic hyperplasia)   . CAD (coronary artery disease)    s/p post-op NSTEMI. treated with stent to LAD. at [facility]  . 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 Diabetes mellitus type 2, controlled ([place])   . Gout   . 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 HTN (hypertension)   . Hyperlipidemia   . MI (myocardial infarction) ([place]) 09/26/2011  . OSA (obstructive sleep apnea)   . PAF (paroxysmal atrial fibrillation) ([place])    s/p DC-CV in 6/13. Off coumadin due to ureteral bleed     Allergies  Allergen Reactions  . Crestor [Rosuvastatin]     Elevated LFT's  . Lorazepam   . Losartan       Current Outpatient Prescriptions on File Prior to Visit  Medication Sig Dispense Refill  . aspirin EC 81 MG tablet Take 81 mg by mouth daily.    Marland Kitchen atorvastatin (LIPITOR) 80 MG tablet Take 1/2 to 1 tablet daily or as directed for cholesterol 90 tablet 1  . betamethasone dipropionate (DIPROLENE) 0.05 % cream Apply topically.    Marland Kitchen buPROPion (WELLBUTRIN XL) 300 MG 24 hr tablet TAKE 1 TABLET BY MOUTH EVERY MORNING. 30 tablet 3  . Cholecalciferol (VITAMIN D PO) Take 1,000 Int'l Units by mouth daily.     . finasteride (PROSCAR) 5 MG tablet TAKE 1 TABLET BY MOUTH DAILY 90 tablet 0  . FREESTYLE TEST STRIPS test  strip CHECK BLOOD SUGAR 3-4 TIMES A DAY FOR MEDICATION ADJUSTMENT 450 each PRN  . hydrOXYzine (VISTARIL) 25 MG capsule TAKE 1-2 CAPSULES BY MOUTH 2-4 TIMES DAILY AS NEEDED FOR ANXIETY 120 capsule 11  . insulin NPH-regular Human (NOVOLIN 70/30) (70-30) 100 UNIT/ML injection Take 20-30 units 2 x day or as directed 10 mL 99  . mycophenolate (MYFORTIC) 360 MG TBEC EC tablet TAKE 1 TABLET BY MOUTH TWICE DAILY 180 tablet 1  . OVER THE COUNTER MEDICATION Take 100 mg by mouth 2 (two) times daily.    . tacrolimus (PROGRAF) 0.5 MG capsule Take 0.5 mg by mouth 2 (two) times daily.   11  . tacrolimus (PROGRAF) 1 MG capsule Take 1 mg by mouth 2 (two) times daily.   2  . TRAVATAN Z 0.004 % SOLN ophthalmic solution Place 1 drop into both eyes at bedtime.     Marland Kitchen warfarin (COUMADIN) 5 MG tablet TAKE 1-2 TABLETS BY MOUTH DAILY AS DIRECTED 180 tablet 1   No current facility-administered medications on file prior to visit.     ROS: all negative except above.   Physical Exam: Filed Weights   11/02/15 0942  Weight: 203 lb (92.1 kg)   BP 122/70   Pulse 62   Temp 98 F (36.7 C) (Temporal)   Resp 16   Ht 5\' 8"  (  1.727 m)   Wt 203 lb (92.1 kg)   BMI 30.87 kg/m  General Appearance: Well developed well nourished, non-toxic appearing in no apparent distress. Eyes: PERRLA, EOMs, conjunctiva w/ no swelling or erythema or discharge Sinuses: No Frontal/maxillary tenderness ENT/Mouth: Ear canals clear without swelling or erythema.  TM's normal bilaterally with no retractions, bulging, or loss of landmarks.   Neck: Supple, thyroid normal, no notable JVD  Respiratory: Respiratory effort normal, Clear breath sounds anteriorly and posteriorly bilaterally without rales, rhonchi, wheezing or stridor. No retractions or accessory muscle usage. Cardio: Ireg ireg with no MRGs., palpable thrill from left wrist graft   Abdomen: Soft, + BS.  Non tender, no guarding, rebound, hernias, masses.  Musculoskeletal: Full ROM, 5/5  strength, normal gait.  Skin: Warm, dry without rashes  Neuro: Awake and oriented X 3, Cranial nerves intact. Normal muscle tone, no cerebellar symptoms. Sensation intact.  Psych: normal affect, Insight and Judgment appropriate.     [name], PA-C 9:49 AM [facility]

## 2015-11-06 ENCOUNTER — Encounter: Payer: Self-pay | Admitting: Internal Medicine

## 2015-11-06 ENCOUNTER — Other Ambulatory Visit: Payer: Self-pay | Admitting: *Deleted

## 2015-11-06 MED ORDER — BUPROPION HCL ER (XL) 300 MG PO TB24: 300.0000 mg | ORAL_TABLET | Freq: Every morning | ORAL | 3 refills | Status: DC

## 2015-11-20 ENCOUNTER — Encounter: Payer: Self-pay | Admitting: Gastroenterology

## 2015-11-29 ENCOUNTER — Encounter: Payer: Self-pay | Admitting: Gastroenterology

## 2015-12-04 ENCOUNTER — Encounter: Payer: Self-pay | Admitting: Internal Medicine

## 2015-12-04 ENCOUNTER — Other Ambulatory Visit: Payer: Self-pay | Admitting: *Deleted

## 2015-12-04 MED ORDER — GLUCOSE BLOOD VI STRP: ORAL_STRIP | 99 refills | Status: DC

## 2015-12-15 ENCOUNTER — Encounter: Payer: Self-pay | Admitting: Gastroenterology

## 2015-12-22 ENCOUNTER — Other Ambulatory Visit: Payer: Self-pay | Admitting: Internal Medicine

## 2015-12-26 ENCOUNTER — Encounter: Payer: Self-pay | Admitting: Internal Medicine

## 2016-01-11 ENCOUNTER — Ambulatory Visit (INDEPENDENT_AMBULATORY_CARE_PROVIDER_SITE_OTHER): Payer: Medicare Other | Admitting: Internal Medicine

## 2016-01-11 VITALS — BP 126/78 | HR 64 | Temp 97.3°F | Resp 16 | Ht 68.0 in | Wt 204.4 lb

## 2016-01-11 DIAGNOSIS — Z125 Encounter for screening for malignant neoplasm of prostate: Secondary | ICD-10-CM

## 2016-01-11 DIAGNOSIS — M1000 Idiopathic gout, unspecified site: Secondary | ICD-10-CM

## 2016-01-11 DIAGNOSIS — Z23 Encounter for immunization: Secondary | ICD-10-CM

## 2016-01-11 DIAGNOSIS — Z1212 Encounter for screening for malignant neoplasm of rectum: Secondary | ICD-10-CM

## 2016-01-11 DIAGNOSIS — Z940 Kidney transplant status: Secondary | ICD-10-CM

## 2016-01-11 DIAGNOSIS — Z136 Encounter for screening for cardiovascular disorders: Secondary | ICD-10-CM

## 2016-01-11 DIAGNOSIS — I480 Paroxysmal atrial fibrillation: Secondary | ICD-10-CM | POA: Diagnosis not present

## 2016-01-11 DIAGNOSIS — N182 Chronic kidney disease, stage 2 (mild): Secondary | ICD-10-CM

## 2016-01-11 DIAGNOSIS — I10 Essential (primary) hypertension: Secondary | ICD-10-CM

## 2016-01-11 DIAGNOSIS — E1022 Type 1 diabetes mellitus with diabetic chronic kidney disease: Secondary | ICD-10-CM

## 2016-01-11 DIAGNOSIS — I25810 Atherosclerosis of coronary artery bypass graft(s) without angina pectoris: Secondary | ICD-10-CM | POA: Insufficient documentation

## 2016-01-11 DIAGNOSIS — E559 Vitamin D deficiency, unspecified: Secondary | ICD-10-CM

## 2016-01-11 DIAGNOSIS — E782 Mixed hyperlipidemia: Secondary | ICD-10-CM

## 2016-01-11 DIAGNOSIS — N320 Bladder-neck obstruction: Secondary | ICD-10-CM

## 2016-01-11 DIAGNOSIS — Z79899 Other long term (current) drug therapy: Secondary | ICD-10-CM

## 2016-01-11 DIAGNOSIS — Z7901 Long term (current) use of anticoagulants: Secondary | ICD-10-CM

## 2016-01-11 DIAGNOSIS — I25708 Atherosclerosis of coronary artery bypass graft(s), unspecified, with other forms of angina pectoris: Secondary | ICD-10-CM

## 2016-01-11 LAB — HEPATIC FUNCTION PANEL
ALBUMIN: 4.3 g/dL (ref 3.6–5.1)
ALK PHOS: 81 U/L (ref 40–115)
ALT: 19 U/L (ref 9–46)
AST: 21 U/L (ref 10–35)
BILIRUBIN TOTAL: 1.2 mg/dL (ref 0.2–1.2)
Bilirubin, Direct: 0.2 mg/dL (ref <=0.2)
Indirect Bilirubin: 1 mg/dL (ref 0.2–1.2)
TOTAL PROTEIN: 6.7 g/dL (ref 6.1–8.1)

## 2016-01-11 LAB — CBC WITH DIFFERENTIAL/PLATELET
BASOS PCT: 1 %
Basophils Absolute: 45 cells/uL (ref 0–200)
Eosinophils Absolute: 90 cells/uL (ref 15–500)
Eosinophils Relative: 2 %
HEMATOCRIT: 44 % (ref 38.5–50.0)
Hemoglobin: 15.1 g/dL (ref 13.2–17.1)
LYMPHS PCT: 27 %
Lymphs Abs: 1215 cells/uL (ref 850–3900)
MCH: 29.3 pg (ref 27.0–33.0)
MCHC: 34.3 g/dL (ref 32.0–36.0)
MCV: 85.3 fL (ref 80.0–100.0)
MONOS PCT: 10 %
MPV: 9.5 fL (ref 7.5–12.5)
Monocytes Absolute: 450 cells/uL (ref 200–950)
NEUTROS PCT: 60 %
Neutro Abs: 2700 cells/uL (ref 1500–7800)
PLATELETS: 146 10*3/uL (ref 140–400)
RBC: 5.16 MIL/uL (ref 4.20–5.80)
RDW: 12.9 % (ref 11.0–15.0)
WBC: 4.5 10*3/uL (ref 3.8–10.8)

## 2016-01-11 LAB — BASIC METABOLIC PANEL WITH GFR
BUN: 16 mg/dL (ref 7–25)
CALCIUM: 9.7 mg/dL (ref 8.6–10.3)
CO2: 25 mmol/L (ref 20–31)
Chloride: 103 mmol/L (ref 98–110)
Creat: 1.15 mg/dL (ref 0.70–1.25)
GFR, EST AFRICAN AMERICAN: 76 mL/min (ref >=60)
GFR, EST NON AFRICAN AMERICAN: 65 mL/min (ref >=60)
GLUCOSE: 137 mg/dL — AB (ref 65–99)
POTASSIUM: 4.4 mmol/L (ref 3.5–5.3)
Sodium: 140 mmol/L (ref 135–146)

## 2016-01-11 LAB — URIC ACID: Uric Acid, Serum: 6.5 mg/dL (ref 4.0–8.0)

## 2016-01-11 LAB — LIPID PANEL
CHOLESTEROL: 173 mg/dL (ref 125–200)
HDL: 61 mg/dL (ref >=40)
LDL Cholesterol: 93 mg/dL (ref <130)
TRIGLYCERIDES: 95 mg/dL (ref <150)
Total CHOL/HDL Ratio: 2.8 Ratio (ref <=5.0)
VLDL: 19 mg/dL (ref <30)

## 2016-01-11 LAB — TSH: TSH: 1.93 mIU/L (ref 0.40–4.50)

## 2016-01-11 LAB — PHOSPHORUS: PHOSPHORUS: 2.4 mg/dL (ref 2.1–4.3)

## 2016-01-11 LAB — MAGNESIUM: Magnesium: 1.6 mg/dL (ref 1.5–2.5)

## 2016-01-11 LAB — PSA

## 2016-01-11 NOTE — Patient Instructions (Signed)

## 2016-01-11 NOTE — Progress Notes (Signed)
[facility]   [name], M.D.    Robert Arnold. [name], P.A.-C      [name], P.A.-C  [facility]                [address] [place], [place] [address] Telephone [phone] Telefax [phone] Comprehensive Evaluation & Examination     This very nice 70 y.o. MWM presents for a  comprehensive evaluation and management of multiple medical co-morbidities.  Patient has been followed for HTN, ASCAD/CABG/Afiub, insulin req T2_DM  , Hyperlipidemia and Vitamin D Deficiency. He has upcoming Coloscopy in Nov w/Dr Armbruster.      HTN predates since 1998. Patient's BP has been controlled at home.Today's BP: 126/78. 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 PO he had an ACS and had PCA/Stents and later in 02/2012 had a CABG.  He's followed by Dr Florene Glen for his post transplant care.  Baseline renals remain  stable at CKD2 with BUN/Creat 16/1.15 and GFR 65 ml/min. He's on maintenance Coumadin for cAfib since his Transplant surg.  Patient denies any cardiac symptoms as chest pain, palpitations, shortness of breath, dizziness or ankle swelling.     Patient's hyperlipidemia is controlled with diet and medications. Patient denies myalgias or other medication SE's. Last lipids were at goal: Lab Results  Component Value Date   CHOL 173 01/11/2016   HDL 61 01/11/2016   LDLCALC 93 01/11/2016   TRIG 95 01/11/2016   CHOLHDL 2.8 01/11/2016      Patient has T2_NIDDM circa 2003 managed initially by diet til starting Metformin in 2007 and then PO Renal transplant was started on Insulin.  He denies reactive hypoglycemic symptoms, visual blurring, diabetic polys or paresthesias. Last A1c was not at goal: Lab Results  Component Value Date   HGBA1C 6.4 (H) 01/11/2016       Finally,  patient has history of Vitamin D Deficiency of "24" in 2014 and last vitamin D was at goal: Lab Results  Component Value Date   VD25OH 83 01/11/2016   Current Outpatient Prescriptions on File Prior to Visit  Medication Sig  . aspirin EC 81 MG tablet Take 81 mg by mouth daily.  Marland Kitchen atorvastatin (LIPITOR) 80 MG tablet Take 1/2 to 1 tablet daily or as directed for cholesterol  . betamethasone dipropionate (DIPROLENE) 0.05 % cream Apply topically.  Marland Kitchen buPROPion (WELLBUTRIN XL) 300 MG 24 hr tablet Take 1 tablet (300 mg total) by mouth every morning.  . Cholecalciferol (VITAMIN D PO) Take 1,000 Int'l Units by mouth daily.   . finasteride (PROSCAR) 5 MG tablet TAKE 1 TABLET BY MOUTH DAILY  . glucose blood (FREESTYLE TEST STRIPS) test strip Check blood sugar 3 to 4 times daily for medication regulation.  . hydrOXYzine (VISTARIL) 25 MG capsule TAKE 1-2 CAPSULES BY MOUTH 2-4 TIMES DAILY AS NEEDED FOR ANXIETY  . insulin NPH-regular Human (NOVOLIN 70/30) (70-30) 100 UNIT/ML injection Take 20-30 units 2 x day or as directed  . mycophenolate (MYFORTIC) 360 MG TBEC EC tablet TAKE 1 TABLET BY MOUTH TWICE DAILY  . tacrolimus (PROGRAF) 0.5 MG capsule Take 0.5 mg by mouth 2 (two) times daily.   . tacrolimus (PROGRAF) 1 MG capsule Take 1 mg by  mouth 2 (two) times daily.   . TRAVATAN Z 0.004 % SOLN ophthalmic solution Place 1 drop into both eyes at bedtime.   Marland Kitchen warfarin (COUMADIN) 5 MG tablet TAKE 1-2 TABLETS BY MOUTH DAILY AS DIRECTED   No current facility-administered medications on file prior to visit.    Allergies  Allergen Reactions  . Crestor [Rosuvastatin]     Elevated LFT's  . Lorazepam   . Losartan    Past Medical History:  Diagnosis Date  . Adenomatous colon polyp   . BPH (benign prostatic hyperplasia)   . CAD (coronary artery disease)    s/p post-op NSTEMI. treated with stent to LAD. at [facility]  . Chronic kidney disease    due IgA nephropathy - s/p kidnet transplant 09/25/11   . Diabetes mellitus type 2, controlled ([place])   . Gout   . 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 HTN (hypertension)   . Hyperlipidemia   . MI (myocardial infarction) 09/26/2011  . OSA (obstructive sleep apnea)   . PAF (paroxysmal atrial fibrillation) ([place])    s/p DC-CV in 6/13. Off coumadin due to ureteral bleed   Health Maintenance  Topic Date Due  . Hepatitis C Screening  1945/08/19  . OPHTHALMOLOGY EXAM  03/29/1958  . TETANUS/TDAP  03/30/1967  . ZOSTAVAX  03/29/2008  . COLONOSCOPY  01/01/2016  . HEMOGLOBIN A1C  07/11/2016  . FOOT EXAM  01/10/2017  . URINE MICROALBUMIN  01/10/2017  . INFLUENZA VACCINE  Completed  . PNA vac Low Risk Adult  Completed   Immunization History  Administered Date(s) Administered  . DT 06/05/2015  . DTaP 04/08/2008  . Hepatitis B 04/08/2009  . Influenza, High Dose Seasonal PF 12/27/2013, 01/17/2015, 01/11/2016  . Pneumococcal Conjugate-13 10/25/2013  . Pneumococcal Polysaccharide-23 06/05/2015  . Pneumococcal-Unspecified 04/08/2008   Past Surgical History:  Procedure Laterality Date  . AV FISTULA PLACEMENT  2011   Left forearm  . COLONOSCOPY    . CORONARY ANGIOPLASTY WITH STENT PLACEMENT    . KIDNEY TRANSPLANT  09/25/2011  . PERITONEAL CATHETER INSERTION  2011   Family History  Problem Relation Age of Onset  . Heart attack Father   . Diabetes Mother   . COPD Mother    Social History   Social History  . Marital status: Married    Spouse name: N/A  . Number of children: N/A  . Years of education: N/A   Occupational History  . Retired Press photographer   Social History Main Topics  . Smoking status: Former Smoker    Quit date: 11/29/1990  . Smokeless tobacco: Never Used  . Alcohol use Yes     Comment: 1-2 a month  . Drug use: No  . Sexual activity: Active    ROS Constitutional: Denies fever, chills, weight loss/gain, headaches, insomnia,  night sweats or change in appetite. Does c/o fatigue. Eyes: Denies redness,  blurred vision, diplopia, discharge, itchy or watery eyes.  ENT: Denies discharge, congestion, post nasal drip, epistaxis, sore throat, earache, hearing loss, dental pain, Tinnitus, Vertigo, Sinus pain or snoring.  Cardio: Denies chest pain, palpitations, irregular heartbeat, syncope, dyspnea, diaphoresis, orthopnea, PND, claudication or edema Respiratory: denies cough, dyspnea, DOE, pleurisy, hoarseness, laryngitis or wheezing.  Gastrointestinal: Denies dysphagia, heartburn, reflux, water brash, pain, cramps, nausea, vomiting, bloating, diarrhea, constipation, hematemesis, melena, hematochezia, jaundice or hemorrhoids Genitourinary: Denies dysuria, frequency, urgency, nocturia, hesitancy, discharge, hematuria or flank pain Musculoskeletal: Denies arthralgia, myalgia, stiffness, Jt. Swelling, pain, limp or strain/sprain. Denies Falls.  Skin: Denies puritis, rash, hives, warts, acne, eczema or change in skin lesion Neuro: No weakness, tremor, incoordination, spasms, paresthesia or pain Psychiatric: Denies confusion, memory loss or sensory loss. Denies Depression. Endocrine: Denies change in weight, skin, hair change, nocturia, and paresthesia, diabetic polys, visual blurring or hyper / hypo glycemic episodes.  Heme/Lymph: No excessive bleeding, bruising or enlarged lymph nodes.  Physical Exam  BP 126/78   Pulse 64   Temp 97.3 F (36.3 C)   Resp 16   Ht 5\' 8"  (1.727 m)   Wt 204 lb 6.4 oz (92.7 kg)   BMI 31.08 kg/m   General Appearance: Well nourished, in no apparent distress.  Eyes: PERRLA, EOMs, conjunctiva no swelling or erythema, normal fundi and vessels. Sinuses: No frontal/maxillary tenderness ENT/Mouth: EACs patent / TMs  nl. Nares clear without erythema, swelling, mucoid exudates. Oral hygiene is good. No erythema, swelling, or exudate. Tongue normal, non-obstructing. Tonsils not swollen or erythematous. Hearing normal.  Neck: Supple, thyroid normal. No bruits, nodes or  JVD. Respiratory: Respiratory effort normal.  BS equal and clear bilateral without rales, rhonci, wheezing or stridor. Cardio: Heart sounds are normal with regular rate and rhythm and no murmurs, rubs or gallops. Peripheral pulses are normal and equal bilaterally without edema. No aortic or femoral bruits. Chest: Median sternotomy scar. symmetric with normal excursions and percussion.  Abdomen: Soft, with Nl bowel sounds. Nontender, no guarding, rebound, hernias, masses, or organomegaly.  Lymphatics: Non tender without lymphadenopathy.  Genitourinary: No hernias.Testes nl. DRE - prostate nl for age - smooth & firm w/o nodules. Musculoskeletal: Full ROM all peripheral extremities, joint stability, 5/5 strength, and normal gait. Skin: Warm and dry without rashes, lesions, cyanosis, clubbing or  ecchymosis.  Neuro: Cranial nerves intact, reflexes equal bilaterally. Normal muscle tone, no cerebellar symptoms. Sensation intact to touch, Vibratory and Monofilament.  Pysch: Alert and oriented X 3 with normal affect, insight and judgment appropriate.   Assessment and Plan  1. Essential hypertension  - EKG 12-Lead - [place], RETROPERITNL ABD,  LTD - TSH  2. Mixed hyperlipidemia  - EKG 12-Lead - [place], RETROPERITNL ABD,  LTD - Lipid panel - TSH  3. 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 Vitamin D deficiency  - VITAMIN D 25 Hydroxy   5. Type 1 diabetes mellitus with diabetic chronic kidney disease, unspecified CKD stage (HCC)  - HM DIABETES FOOT EXAM - LOW EXTREMITY NEUR EXAM DOCUM  6. Coronary artery disease involving coronary bypass graft of native heart with other forms of angina pectoris (HCC)  - EKG 12-Lead  7. PAF (paroxysmal atrial fibrillation) (HCC)  - Protime-INR  8. 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 Renal Transplant, s/p 09/2011  - Tacrolimus Level - Phosphorus  10. Screening for rectal cancer   11. Bladder neck obstruction  - PSA  12. Prostate cancer screening  - PSA  13. Idiopathic gout, unspecified chronicity, unspecified site  - Uric acid  14. Need for prophylactic vaccination with tetanus-diphtheria (TD)   15. Screening for ischemic heart disease  - EKG 12-Lead  16. Screening for AAA (aortic abdominal aneurysm)  - [place], RETROPERITNL ABD,  LTD  17. Long term current use of anticoagulant therapy  -  patient may hold coumadin 5 days before upcoming colonoscopy and resume pending intervention(s) - Protime-INR monthly  18. Medication management  - CBC with Differential/Platelet - BASIC METABOLIC PANEL WITH GFR - Hepatic function panel - Magnesium - Tacrolimus Level - Phosphorus  19. Need for prophylactic vaccination and inoculation against influenza  - Flu vaccine HIGH DOSE PF (Fluzone High dose)       Continue prudent diet as discussed, weight control, BP monitoring, regular exercise, and medications as discussed.  Discussed med effects and SE's. Routine screening labs and tests as requested with regular follow-up as recommended. Over 40 minutes of exam, counseling, chart review and high complex critical decision making was performed

## 2016-01-12 ENCOUNTER — Encounter: Payer: Self-pay | Admitting: Internal Medicine

## 2016-01-12 LAB — URINALYSIS, ROUTINE W REFLEX MICROSCOPIC
Bilirubin Urine: NEGATIVE
Glucose, UA: NEGATIVE
HGB URINE DIPSTICK: NEGATIVE
Ketones, ur: NEGATIVE
LEUKOCYTES UA: NEGATIVE
NITRITE: NEGATIVE
PROTEIN: NEGATIVE
Specific Gravity, Urine: 1.02 (ref 1.001–1.035)
pH: 6 (ref 5.0–8.0)

## 2016-01-12 LAB — PROTIME-INR
INR: 1.8 — AB
Prothrombin Time: 18.4 s — ABNORMAL HIGH (ref 9.0–11.5)

## 2016-01-12 LAB — HEMOGLOBIN A1C
Hgb A1c MFr Bld: 6.4 % — ABNORMAL HIGH (ref <5.7)
MEAN PLASMA GLUCOSE: 137 mg/dL

## 2016-01-12 LAB — TACROLIMUS LEVEL: TACROLIMUS LVL: 7.6 ng/mL (ref 5.0–20.0)

## 2016-01-12 LAB — VITAMIN D 25 HYDROXY (VIT D DEFICIENCY, FRACTURES): VIT D 25 HYDROXY: 83 ng/mL (ref 30–100)

## 2016-01-12 LAB — MICROALBUMIN / CREATININE URINE RATIO
CREATININE, URINE: 186 mg/dL (ref 20–370)
MICROALB UR: 1.5 mg/dL
MICROALB/CREAT RATIO: 8 ug/mg{creat} (ref <30)

## 2016-02-01 ENCOUNTER — Other Ambulatory Visit: Payer: Self-pay | Admitting: Internal Medicine

## 2016-02-20 ENCOUNTER — Encounter: Payer: Self-pay | Admitting: Internal Medicine

## 2016-02-20 ENCOUNTER — Ambulatory Visit (INDEPENDENT_AMBULATORY_CARE_PROVIDER_SITE_OTHER): Payer: Medicare Other | Admitting: Internal Medicine

## 2016-02-20 VITALS — BP 122/64 | HR 68 | Temp 98.0°F | Resp 16 | Ht 68.0 in | Wt 208.0 lb

## 2016-02-20 DIAGNOSIS — I480 Paroxysmal atrial fibrillation: Secondary | ICD-10-CM

## 2016-02-20 DIAGNOSIS — Z7901 Long term (current) use of anticoagulants: Secondary | ICD-10-CM

## 2016-02-20 DIAGNOSIS — I25708 Atherosclerosis of coronary artery bypass graft(s), unspecified, with other forms of angina pectoris: Secondary | ICD-10-CM

## 2016-02-20 NOTE — Progress Notes (Signed)
Assessment and Plan:   1. PAF (paroxysmal atrial fibrillation) (HCC) -cont coumadin -dose adjust if necessary - Protime-INR  2. Long term current use of anticoagulant therapy -cont coumadin -dose adjust if necessary - Protime-INR     HPI 70 y.o.male presents for 1 month follow up of coumadin recheck.  He is currently on 7.5 mg five days  weekly and 5 mg the rest of the days of the week.  He is currently without issue.  He is taking his medication and has no missed doses.  No falls, Has not hit his head. Patient reports that they have been doing well.  male is taking their medication.  They are not having difficulty with their medications.  No melena, no hematochezia, no   Past Medical History:  Diagnosis Date  . Adenomatous colon polyp   . BPH (benign prostatic hyperplasia)   . CAD (coronary artery disease)    s/p post-op NSTEMI. treated with stent to LAD. at [facility]  . 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 Diabetes mellitus type 2, controlled ([place])   . Gout   . 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 HTN (hypertension)   . Hyperlipidemia   . MI (myocardial infarction) 09/26/2011  . OSA (obstructive sleep apnea)   . PAF (paroxysmal atrial fibrillation) ([place])    s/p DC-CV in 6/13. Off coumadin due to ureteral bleed     Allergies  Allergen Reactions  . Crestor [Rosuvastatin]     Elevated LFT's  . Lorazepam   . Losartan       Current Outpatient Prescriptions on File Prior to Visit  Medication Sig Dispense Refill  . aspirin EC 81 MG tablet Take 81 mg by mouth daily.    Marland Kitchen atorvastatin (LIPITOR) 80 MG tablet Take 1/2 to 1 tablet daily or as directed for cholesterol 90 tablet 1  . betamethasone dipropionate (DIPROLENE) 0.05 % cream Apply topically.    . Black Pepper-Turmeric (TURMERIC COMPLEX/BLACK PEPPER PO) Take 1,500 mg by mouth daily.    Marland Kitchen buPROPion (WELLBUTRIN XL) 300 MG 24 hr tablet Take 1  tablet (300 mg total) by mouth every morning. 30 tablet 3  . Cholecalciferol (VITAMIN D PO) Take 1,000 Int'l Units by mouth daily.     . finasteride (PROSCAR) 5 MG tablet TAKE 1 TABLET BY MOUTH DAILY 90 tablet 1  . glucose blood (FREESTYLE TEST STRIPS) test strip Check blood sugar 3 to 4 times daily for medication regulation. 450 each PRN  . hydrOXYzine (VISTARIL) 25 MG capsule TAKE 1-2 CAPSULES BY MOUTH 2-4 TIMES DAILY AS NEEDED FOR ANXIETY 120 capsule 11  . insulin NPH-regular Human (NOVOLIN 70/30) (70-30) 100 UNIT/ML injection Take 20-30 units 2 x day or as directed 10 mL 99  . MAGNESIUM-OXIDE 400 (241.3 Mg) MG tablet TK 1 T PO BID  6  . mycophenolate (MYFORTIC) 360 MG TBEC EC tablet TAKE 1 TABLET BY MOUTH TWICE DAILY 180 tablet 0  . Probiotic Product (PROBIOTIC-10 ULTIMATE) CAPS Take 2 capsules by mouth 2 (two) times daily. 25 billiion    . tacrolimus (PROGRAF) 0.5 MG capsule Take 0.5 mg by mouth 2 (two) times daily.   11  . tacrolimus (PROGRAF) 1 MG capsule Take 1 mg by mouth 2 (two) times daily.   2  . TRAVATAN Z 0.004 % SOLN ophthalmic solution Place 1 drop into both eyes at bedtime.     Marland Kitchen warfarin (COUMADIN) 5  MG tablet TAKE 1-2 TABLETS BY MOUTH DAILY AS DIRECTED 180 tablet 1   No current facility-administered medications on file prior to visit.     ROS: all negative except above.   Physical Exam: Filed Weights   02/20/16 1025  Weight: 208 lb (94.3 kg)   Temp 98 F (36.7 C) (Temporal)   Resp 16   Ht 5\' 8"  (1.727 m)   Wt 208 lb (94.3 kg)   BMI 31.63 kg/m  General Appearance: Well developed well nourished, non-toxic appearing in no apparent distress. Eyes: PERRLA, EOMs, conjunctiva w/ no swelling or erythema or discharge Sinuses: No Frontal/maxillary tenderness ENT/Mouth: Ear canals clear without swelling or erythema.  TM's normal bilaterally with no retractions, bulging, or loss of landmarks.   Neck: Supple, thyroid normal, no notable JVD  Respiratory: Respiratory effort  normal, Clear breath sounds anteriorly and posteriorly bilaterally without rales, rhonchi, wheezing or stridor. No retractions or accessory muscle usage. Cardio: RRR with no 3/6 murmur heard best on left sternal border, no RGs.   Abdomen: Soft, + BS.  Non tender, no guarding, rebound, hernias, masses.  Musculoskeletal: Full ROM, 5/5 strength, normal gait.  Skin: Warm, dry without rashes  Neuro: Awake and oriented X 3, Cranial nerves intact. Normal muscle tone, no cerebellar symptoms. Sensation intact.  Psych: normal affect, Insight and Judgment appropriate.     [name], PA-C 10:31 AM [facility]

## 2016-02-21 LAB — PROTIME-INR
INR: 3.9 — ABNORMAL HIGH
Prothrombin Time: 39.3 s — ABNORMAL HIGH (ref 9.0–11.5)

## 2016-02-21 LAB — HM DIABETES EYE EXAM

## 2016-02-23 ENCOUNTER — Ambulatory Visit (INDEPENDENT_AMBULATORY_CARE_PROVIDER_SITE_OTHER): Payer: Medicare Other | Admitting: Gastroenterology

## 2016-02-23 ENCOUNTER — Telehealth: Payer: Self-pay

## 2016-02-23 ENCOUNTER — Encounter: Payer: Self-pay | Admitting: Gastroenterology

## 2016-02-23 VITALS — BP 122/76 | HR 72 | Ht 68.0 in | Wt 208.4 lb

## 2016-02-23 DIAGNOSIS — K760 Fatty (change of) liver, not elsewhere classified: Secondary | ICD-10-CM

## 2016-02-23 DIAGNOSIS — I25708 Atherosclerosis of coronary artery bypass graft(s), unspecified, with other forms of angina pectoris: Secondary | ICD-10-CM

## 2016-02-23 DIAGNOSIS — Z1211 Encounter for screening for malignant neoplasm of colon: Secondary | ICD-10-CM

## 2016-02-23 MED ORDER — NA SULFATE-K SULFATE-MG SULF 17.5-3.13-1.6 GM/177ML PO SOLN: 1.0000 | Freq: Once | ORAL | 0 refills | Status: AC

## 2016-02-23 NOTE — Patient Instructions (Signed)
If you are age 68 or older, your body mass index should be between 23-30. Your Body mass index is 31.68 kg/m. If this is out of the aforementioned range listed, please consider follow up with your Primary Care Provider.  If you are age 3 or younger, your body mass index should be between 19-25. Your Body mass index is 31.68 kg/m. If this is out of the aformentioned range listed, please consider follow up with your Primary Care Provider.   You have been scheduled for a colonoscopy. Please follow written instructions given to you at your visit today.  Please pick up your prep supplies at the pharmacy within the next 1-3 days. If you use inhalers (even only as needed), please bring them with you on the day of your procedure. Your physician has requested that you go to www.startemmi.com and enter the access code given to you at your visit today. This web site gives a general overview about your procedure. However, you should still follow specific instructions given to you by our office regarding your preparation for the procedure.  Thank you for choosing [facility]  Dr Havery Moros

## 2016-02-23 NOTE — Progress Notes (Signed)
HPI :  70 y/o male with a history of CAD s/p NSTEMI and PAF, currently on coumadin, and s/p renal transplant in 2013, here for a surveillance colonoscopy. Last colonoscopy in 2012 with one small adenoma.  He denies any blood in the stools. No constipation or diarrhea, regular bowel habits. No weight loss. No FH of colon cancer.  No history of CVA. History of MI following renal transplant 2013. No chest pains or shortness of breath. No trouble with anesthesia.   He has a history of fatty liver disease. Normal LFTs. Stable weight in the past year. No chronic steroid use. He seldomly uses alcohol. He drinks coffee routinely.  Colonoscopy 01/01/2011 - diverticulosis, 73mm transverse adenoma  Past Medical History:  Diagnosis Date  . Adenomatous colon polyp   . BPH (benign prostatic hyperplasia)   . CAD (coronary artery disease)    s/p post-op NSTEMI. treated with stent to LAD. at [facility]  . 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 Diabetes mellitus type 2, controlled ([place])   . Gout   . 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 HTN (hypertension)   . Hyperlipidemia   . MI (myocardial infarction) 09/26/2011  . OSA (obstructive sleep apnea)   . PAF (paroxysmal atrial fibrillation) ([place])    s/p DC-CV in 6/13. Off coumadin due to ureteral bleed     Past Surgical History:  Procedure Laterality Date  . AV FISTULA PLACEMENT  2011   Left forearm  . COLONOSCOPY    . CORONARY ANGIOPLASTY WITH STENT PLACEMENT    . KIDNEY TRANSPLANT  09/25/2011  . PERITONEAL CATHETER INSERTION  2011   Family History  Problem Relation Age of Onset  . Heart attack Father   . Diabetes Mother   . COPD Mother   . Kidney disease Sister   . Colon cancer Neg Hx   . Stomach cancer Neg Hx   . Rectal cancer Neg Hx   . Esophageal cancer Neg Hx   . Liver cancer Neg Hx    Social History  Substance Use Topics  . Smoking status: Former Smoker      Quit date: 11/29/1990  . Smokeless tobacco: Never Used  . Alcohol use Yes     Comment: 1-2 a month   Current Outpatient Prescriptions  Medication Sig Dispense Refill  . aspirin EC 81 MG tablet Take 81 mg by mouth daily.    Marland Kitchen atorvastatin (LIPITOR) 80 MG tablet Take 1/2 to 1 tablet daily or as directed for cholesterol 90 tablet 1  . betamethasone dipropionate (DIPROLENE) 0.05 % cream Apply topically.    . Black Pepper-Turmeric (TURMERIC COMPLEX/BLACK PEPPER PO) Take 1,500 mg by mouth daily.    Marland Kitchen buPROPion (WELLBUTRIN XL) 300 MG 24 hr tablet Take 1 tablet (300 mg total) by mouth every morning. 30 tablet 3  . Cholecalciferol (VITAMIN D PO) Take 1,000 Int'l Units by mouth daily.     . finasteride (PROSCAR) 5 MG tablet TAKE 1 TABLET BY MOUTH DAILY 90 tablet 1  . glucose blood (FREESTYLE TEST STRIPS) test strip Check blood sugar 3 to 4 times daily for medication regulation. 450 each PRN  . hydrOXYzine (VISTARIL) 25 MG capsule TAKE 1-2 CAPSULES BY MOUTH 2-4 TIMES DAILY AS NEEDED FOR ANXIETY 120 capsule 11  . insulin NPH-regular Human (NOVOLIN 70/30) (70-30) 100 UNIT/ML injection Take 20-30 units 2 x day or as directed 10 mL 99  .  MAGNESIUM-OXIDE 400 (241.3 Mg) MG tablet TK 1 T PO BID  6  . mycophenolate (MYFORTIC) 360 MG TBEC EC tablet TAKE 1 TABLET BY MOUTH TWICE DAILY 180 tablet 0  . Probiotic Product (PROBIOTIC-10 ULTIMATE) CAPS Take 2 capsules by mouth 2 (two) times daily. 25 billiion    . tacrolimus (PROGRAF) 0.5 MG capsule Take 0.5 mg by mouth 2 (two) times daily.   11  . tacrolimus (PROGRAF) 1 MG capsule Take 1 mg by mouth 2 (two) times daily.   2  . TRAVATAN Z 0.004 % SOLN ophthalmic solution Place 1 drop into both eyes at bedtime.     Marland Kitchen warfarin (COUMADIN) 5 MG tablet TAKE 1-2 TABLETS BY MOUTH DAILY AS DIRECTED 180 tablet 1   No current facility-administered medications for this visit.    Allergies  Allergen Reactions  . Crestor [Rosuvastatin]     Elevated LFT's  . Lorazepam   .  Losartan      Review of Systems: All systems reviewed and negative except where noted in HPI.    Lab Results  Component Value Date   WBC 4.5 01/11/2016   HGB 15.1 01/11/2016   HCT 44.0 01/11/2016   MCV 85.3 01/11/2016   PLT 146 01/11/2016    Lab Results  Component Value Date   CREATININE 1.15 01/11/2016   BUN 16 01/11/2016   NA 140 01/11/2016   K 4.4 01/11/2016   CL 103 01/11/2016   CO2 25 01/11/2016    Lab Results  Component Value Date   ALT 19 01/11/2016   AST 21 01/11/2016   ALKPHOS 81 01/11/2016   BILITOT 1.2 01/11/2016    Lab Results  Component Value Date   INR 3.9 (H) 02/20/2016   INR 1.8 (H) 01/11/2016   INR 2.0 (H) 11/02/2015      Physical Exam: BP 122/76   Pulse 72   Ht 5\' 8"  (1.727 m)   Wt 208 lb 6 oz (94.5 kg)   BMI 31.68 kg/m  Constitutional: Pleasant,well-developed, male in no acute distress. HEENT: Normocephalic and atraumatic. Conjunctivae are normal. No scleral icterus. Neck supple.  Cardiovascular: Normal rate, regular rhythm.  Pulmonary/chest: Effort normal and breath sounds normal. No wheezing, rales or rhonchi. Abdominal: Soft, nondistended, nontender.  There are no masses palpable. No hepatomegaly. Extremities: no edema Lymphadenopathy: No cervical adenopathy noted. Neurological: Alert and oriented to person place and time. Skin: Skin is warm and dry. No rashes noted. Psychiatric: Normal mood and affect. Behavior is normal.   ASSESSMENT AND PLAN: 70 y/o male s/p renal transplant, NSTEMI, and PAF on coumadin, with history of colon adenoma 5 years ago, here to discuss surveillance colonoscopy. He is due for a colonoscopy at this time given his history of adenoma. Otherwise asymptomatic without anemia. I discussed risks / benefits of colonoscopy and anesthesia with him. Given his coumadin use, this would need to be held 5 days prior to the procedure and will obtain approval from ordering physician. Following this discussion he wished  to proceed.   Otherwise he was noted to have fatty liver on [place] a few years ago. His LAEs have been normal, with normal ALT. I discussed spectrum of fatty liver disease with him. Recommend yearly LFTs. He should exercise routinely, weight loss if possible, and encourage routine coffee drinking. He should minimize alcohol intake. He agreed with the plan.   [place] Cellar, MD [facility] Pager [phone]  CC: [name], MD

## 2016-02-23 NOTE — Telephone Encounter (Signed)
  RE: Robert Arnold DOB: 1946-03-09 MRN: [id number]   Dear Dr Melford Aase,    We have scheduled the above patient for an endoscopic procedure (colonoscopy). Our records show that he is on anticoagulation therapy.   Please advise as to how long the patient may come off his therapy of Coumadin prior to the procedure, which is scheduled for 03-13-2016.  Please fax back/ or route the completed form to Christell Constant at [phone].   Sincerely,   T.Sherren Mocha, CMA

## 2016-02-26 NOTE — Telephone Encounter (Signed)
Left message to return call 

## 2016-02-26 NOTE — Telephone Encounter (Signed)
[name], MD  Elias Else, CMA        Vivien Rota - I feel with Mr Glassco hx/o pAfib stable since 2013 and no known recurrent episodes since then, that he could safely stop coumadin 6 days prior to procedure and resume post procedure when felt clinically stable. I do not recommend Lovenox bridging for him. [facility]

## 2016-03-05 ENCOUNTER — Encounter: Payer: Self-pay | Admitting: Internal Medicine

## 2016-03-05 NOTE — Telephone Encounter (Signed)
Pt returned cal. Notified and aware of directions from Dr Melford Aase. Pt states clear understanding.

## 2016-03-05 NOTE — Telephone Encounter (Signed)
Left a voicemail to return call for instructions on holding his current blood thinner.

## 2016-03-13 ENCOUNTER — Ambulatory Visit (AMBULATORY_SURGERY_CENTER): Payer: Medicare Other | Admitting: Gastroenterology

## 2016-03-13 ENCOUNTER — Encounter: Payer: Self-pay | Admitting: Gastroenterology

## 2016-03-13 VITALS — BP 117/68 | HR 67 | Temp 97.3°F | Resp 11 | Ht 68.0 in | Wt 208.0 lb

## 2016-03-13 DIAGNOSIS — D122 Benign neoplasm of ascending colon: Secondary | ICD-10-CM

## 2016-03-13 DIAGNOSIS — Z1211 Encounter for screening for malignant neoplasm of colon: Secondary | ICD-10-CM

## 2016-03-13 DIAGNOSIS — Z86010 Personal history of colonic polyps: Secondary | ICD-10-CM

## 2016-03-13 LAB — GLUCOSE, CAPILLARY
Glucose-Capillary: 145 mg/dL — ABNORMAL HIGH (ref 65–99)
Glucose-Capillary: 131 mg/dL — ABNORMAL HIGH (ref 65–99)

## 2016-03-13 MED ORDER — SODIUM CHLORIDE 0.9 % IV SOLN: 500.0000 mL | INTRAVENOUS | Status: DC

## 2016-03-13 NOTE — Progress Notes (Signed)
A and O x3. Report to RN. Tolerated MAC anesthesia well. 

## 2016-03-13 NOTE — Patient Instructions (Signed)
Colon polyp removed today. Result letter in your mail in 2-3 weeks. Handouts given on polyps,hemorrhoids, diverticulosis. Resume coumadin tonight. Resume current medications. DO NOT take any ibuprofen, naproxen or other non-steriodal anti-inflammatory medications for 2 weeks. Call us with any questions or concerns.  Thank you!   YOU HAD AN ENDOSCOPIC PROCEDURE TODAY AT [facility]:   Refer to the procedure report that was given to you for any specific questions about what was found during the examination.  If the procedure report does not answer your questions, please call your gastroenterologist to clarify.  If you requested that your care partner not be given the details of your procedure findings, then the procedure report has been included in a sealed envelope for you to review at your convenience later.  YOU SHOULD EXPECT: Some feelings of bloating in the abdomen. Passage of more gas than usual.  Walking can help get rid of the air that was put into your GI tract during the procedure and reduce the bloating. If you had a lower endoscopy (such as a colonoscopy or flexible sigmoidoscopy) you may notice spotting of blood in your stool or on the toilet paper. If you underwent a bowel prep for your procedure, you may not have a normal bowel movement for a few days.  Please Note:  You might notice some irritation and congestion in your nose or some drainage.  This is from the oxygen used during your procedure.  There is no need for concern and it should clear up in a day or so.  SYMPTOMS TO REPORT IMMEDIATELY:   Following lower endoscopy (colonoscopy or flexible sigmoidoscopy):  Excessive amounts of blood in the stool  Significant tenderness or worsening of abdominal pains  Swelling of the abdomen that is new, acute  Fever of 100F or higher  For urgent or emergent issues, a gastroenterologist can be reached at any hour by calling [phone].   DIET:  We do recommend a  small meal at first, but then you may proceed to your regular diet.  Drink plenty of fluids but you should avoid alcoholic beverages for 24 hours.  ACTIVITY:  You should plan to take it easy for the rest of today and you should NOT DRIVE or use heavy machinery until tomorrow (because of the sedation medicines used during the test).    FOLLOW UP: Our staff will call the number listed on your records the next business day following your procedure to check on you and address any questions or concerns that you may have regarding the information given to you following your procedure. If we do not reach you, we will leave a message.  However, if you are feeling well and you are not experiencing any problems, there is no need to return our call.  We will assume that you have returned to your regular daily activities without incident.  If any biopsies were taken you will be contacted by phone or by letter within the next 1-3 weeks.  Please call us at [phone] if you have not heard about the biopsies in 3 weeks.    SIGNATURES/CONFIDENTIALITY: You and/or your care partner have signed paperwork which will be entered into your electronic medical record.  These signatures attest to the fact that that the information above on your After Visit Summary has been reviewed and is understood.  Full responsibility of the confidentiality of this discharge information lies with you and/or your care-partner.

## 2016-03-13 NOTE — Progress Notes (Signed)
Called to room to assist during endoscopic procedure.  Patient ID and intended procedure confirmed with present staff. Received instructions for my participation in the procedure from the performing physician.  

## 2016-03-13 NOTE — Op Note (Signed)
[place] Patient Name: Robert Arnold Procedure Date: 03/13/2016 3:25 PM MRN: [id number] Endoscopist: Remo Lipps P. Armbruster MD, MD Age: 70 Referring MD:  Date of Birth: 1945/10/07 Gender: Male Account #: [id number] Procedure:                Colonoscopy Indications:              Surveillance: Personal history of adenomatous                            polyps on last colonoscopy 5 years ago Medicines:                Monitored Anesthesia Care Procedure:                Pre-Anesthesia Assessment:                           - Prior to the procedure, a History and Physical                            was performed, and patient medications and                            allergies were reviewed. The patient's tolerance of                            previous anesthesia was also reviewed. The risks                            and benefits of the procedure and the sedation                            options and risks were discussed with the patient.                            All questions were answered, and informed consent                            was obtained. Prior Anticoagulants: The patient has                            taken Coumadin (warfarin), last dose was 6 days                            prior to procedure. ASA Grade Assessment: III - A                            patient with severe systemic disease. After                            reviewing the risks and benefits, the patient was                            deemed in satisfactory condition to undergo the  procedure.                           After obtaining informed consent, the colonoscope                            was passed under direct vision. Throughout the                            procedure, the patient's blood pressure, pulse, and                            oxygen saturations were monitored continuously. The                            Model CF-HQ190L [id number]) scope was introduced                           through the anus and advanced to the the cecum,                            identified by appendiceal orifice and ileocecal                            valve. The colonoscopy was performed without                            difficulty. The patient tolerated the procedure                            well. The quality of the bowel preparation was                            good. The ileocecal valve, appendiceal orifice, and                            rectum were photographed. Scope In: 3:28:34 PM Scope Out: 3:42:58 PM Scope Withdrawal Time: 0 hours 11 minutes 25 seconds  Total Procedure Duration: 0 hours 14 minutes 24 seconds  Findings:                 The perianal and digital rectal examinations were                            normal.                           A 5 mm polyp was found in the ascending colon. The                            polyp was sessile. The polyp was removed with a                            cold snare. Resection and retrieval were complete.  A single medium-mouthed diverticulum was found in                            the sigmoid colon.                           Internal hemorrhoids were found during retroflexion.                           The exam was otherwise without abnormality. Complications:            No immediate complications. Estimated blood loss:                            Minimal. Estimated Blood Loss:     Estimated blood loss was minimal. Impression:               - One 5 mm polyp in the ascending colon, removed                            with a cold snare. Resected and retrieved.                           - Diverticulosis in the sigmoid colon.                           - Internal hemorrhoids.                           - The examination was otherwise normal. Recommendation:           - Patient has a contact number available for                            emergencies. The signs and symptoms of potential                             delayed complications were discussed with the                            patient. Return to normal activities tomorrow.                            Written discharge instructions were provided to the                            patient.                           - Resume previous diet.                           - Continue present medications.                           - Resume coumadin tonight                           -  No ibuprofen, naproxen, or other non-steroidal                            anti-inflammatory drugs for 2 weeks after polyp                            removal.                           - Await pathology results.                           - Repeat colonoscopy is recommended for                            surveillance. The colonoscopy date will be                            determined after pathology results from today's                            exam become available for review. Remo Lipps P. Armbruster MD, MD 03/13/2016 3:49:06 PM This report has been signed electronically.

## 2016-03-14 ENCOUNTER — Telehealth: Payer: Self-pay | Admitting: *Deleted

## 2016-03-14 NOTE — Telephone Encounter (Signed)
  Follow up Call-  Call back number 03/13/2016  Post procedure Call Back phone  # [phone]  Permission to leave phone message Yes  Some recent data might be hidden     Patient questions:  Do you have a fever, pain , or abdominal swelling? No. Pain Score  0 *  Have you tolerated food without any problems? Yes.    Have you been able to return to your normal activities? Yes.    Do you have any questions about your discharge instructions: Diet   No. Medications  No. Follow up visit  No.  Do you have questions or concerns about your Care? No.  Actions: * If pain score is 4 or above: No action needed, pain <4.

## 2016-03-17 ENCOUNTER — Other Ambulatory Visit: Payer: Self-pay | Admitting: Internal Medicine

## 2016-03-21 ENCOUNTER — Encounter: Payer: Self-pay | Admitting: Gastroenterology

## 2016-04-03 ENCOUNTER — Ambulatory Visit: Payer: Self-pay | Admitting: Internal Medicine

## 2016-04-05 ENCOUNTER — Encounter: Payer: Self-pay | Admitting: Physician Assistant

## 2016-04-05 ENCOUNTER — Ambulatory Visit (INDEPENDENT_AMBULATORY_CARE_PROVIDER_SITE_OTHER): Payer: Medicare Other | Admitting: Physician Assistant

## 2016-04-05 VITALS — BP 138/80 | HR 75 | Temp 97.3°F | Resp 16 | Ht 68.0 in | Wt 212.0 lb

## 2016-04-05 DIAGNOSIS — G4733 Obstructive sleep apnea (adult) (pediatric): Secondary | ICD-10-CM

## 2016-04-05 DIAGNOSIS — M1030 Gout due to renal impairment, unspecified site: Secondary | ICD-10-CM

## 2016-04-05 DIAGNOSIS — Z940 Kidney transplant status: Secondary | ICD-10-CM

## 2016-04-05 DIAGNOSIS — I480 Paroxysmal atrial fibrillation: Secondary | ICD-10-CM | POA: Diagnosis not present

## 2016-04-05 DIAGNOSIS — Z0001 Encounter for general adult medical examination with abnormal findings: Secondary | ICD-10-CM | POA: Diagnosis not present

## 2016-04-05 DIAGNOSIS — E782 Mixed hyperlipidemia: Secondary | ICD-10-CM

## 2016-04-05 DIAGNOSIS — I10 Essential (primary) hypertension: Secondary | ICD-10-CM

## 2016-04-05 DIAGNOSIS — N400 Enlarged prostate without lower urinary tract symptoms: Secondary | ICD-10-CM

## 2016-04-05 DIAGNOSIS — I214 Non-ST elevation (NSTEMI) myocardial infarction: Secondary | ICD-10-CM | POA: Diagnosis not present

## 2016-04-05 DIAGNOSIS — R059 Cough, unspecified: Secondary | ICD-10-CM

## 2016-04-05 DIAGNOSIS — Z0000 Encounter for general adult medical examination without abnormal findings: Secondary | ICD-10-CM

## 2016-04-05 DIAGNOSIS — Z79899 Other long term (current) drug therapy: Secondary | ICD-10-CM

## 2016-04-05 DIAGNOSIS — I5022 Chronic systolic (congestive) heart failure: Secondary | ICD-10-CM

## 2016-04-05 DIAGNOSIS — D696 Thrombocytopenia, unspecified: Secondary | ICD-10-CM | POA: Diagnosis not present

## 2016-04-05 DIAGNOSIS — E559 Vitamin D deficiency, unspecified: Secondary | ICD-10-CM

## 2016-04-05 DIAGNOSIS — I248 Other forms of acute ischemic heart disease: Secondary | ICD-10-CM

## 2016-04-05 DIAGNOSIS — N182 Chronic kidney disease, stage 2 (mild): Secondary | ICD-10-CM

## 2016-04-05 DIAGNOSIS — R05 Cough: Secondary | ICD-10-CM

## 2016-04-05 DIAGNOSIS — Z7901 Long term (current) use of anticoagulants: Secondary | ICD-10-CM

## 2016-04-05 DIAGNOSIS — F325 Major depressive disorder, single episode, in full remission: Secondary | ICD-10-CM

## 2016-04-05 DIAGNOSIS — R6889 Other general symptoms and signs: Secondary | ICD-10-CM | POA: Diagnosis not present

## 2016-04-05 DIAGNOSIS — B399 Histoplasmosis, unspecified: Secondary | ICD-10-CM

## 2016-04-05 DIAGNOSIS — I25708 Atherosclerosis of coronary artery bypass graft(s), unspecified, with other forms of angina pectoris: Secondary | ICD-10-CM

## 2016-04-05 DIAGNOSIS — I2489 Other forms of acute ischemic heart disease: Secondary | ICD-10-CM

## 2016-04-05 LAB — CBC WITH DIFFERENTIAL/PLATELET
Basophils Absolute: 0 cells/uL (ref 0–200)
Basophils Relative: 0 %
EOS PCT: 1 %
Eosinophils Absolute: 70 cells/uL (ref 15–500)
HEMATOCRIT: 43.4 % (ref 38.5–50.0)
Hemoglobin: 14.5 g/dL (ref 13.2–17.1)
LYMPHS PCT: 17 %
Lymphs Abs: 1190 cells/uL (ref 850–3900)
MCH: 29.8 pg (ref 27.0–33.0)
MCHC: 33.4 g/dL (ref 32.0–36.0)
MCV: 89.1 fL (ref 80.0–100.0)
MONO ABS: 630 {cells}/uL (ref 200–950)
MONOS PCT: 9 %
MPV: 9.2 fL (ref 7.5–12.5)
NEUTROS PCT: 73 %
Neutro Abs: 5110 cells/uL (ref 1500–7800)
PLATELETS: 124 10*3/uL — AB (ref 140–400)
RBC: 4.87 MIL/uL (ref 4.20–5.80)
RDW: 13.4 % (ref 11.0–15.0)
WBC: 7 10*3/uL (ref 3.8–10.8)

## 2016-04-05 LAB — PROTIME-INR
INR: 2.2 — AB
Prothrombin Time: 22.8 s — ABNORMAL HIGH (ref 9.0–11.5)

## 2016-04-05 NOTE — Progress Notes (Signed)
MEDICARE ANNUAL WELLNESS VISIT AND FOLLOW UP Assessment:    Essential hypertension - continue medications, DASH diet, exercise and monitor at home. Call if greater than 130/80.  -     CBC with Differential/Platelet  PAF (paroxysmal atrial fibrillation) (HCC) Check coumadin level, rate controlled -     Protime-INR  Chronic systolic heart failure (HCC) Control blood pressure, cholesterol, glucose, increase exercise.  Weight is up, possible PND, will decrease salt, restrict fluid and close follow up If not better will get CXR, declines at this time  Other forms of acute ischemic heart disease ([place]) Control blood pressure, cholesterol, glucose, increase exercise.   NSTEMI (non-ST elevated myocardial infarction) ([place]) Control blood pressure, cholesterol, glucose, increase exercise.   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   OSA (obstructive sleep apnea) denies  Mixed hyperlipidemia -continue medications, check lipids, decrease fatty foods, increase activity.   Thrombocytopenia ([place]) Check CBC  Morbid Obesity with co morbidities - long discussion about weight loss, diet, and exercise  Depression, major, in remission ([place]) Continue medications, in remission  Renal Transplant, s/p 09/2011 Continue follow up  Gout due to renal impairment, unspecified chronicity, unspecified site Gout- recheck Uric acid as needed, Diet discussed, continue medications.  Histoplasmosis monitor  Vitamin D deficiency  Medication management  Long term current use of anticoagulant therapy Check INR  Medicare annual wellness exam  Stage 2 chronic kidney disease monitor  Benign prostatic hyperplasia without lower urinary tract symptoms Continue medications  Cough Weight is up, possible PND, will decrease salt, restrict fluid and close follow up If  not better will get CXR, declines at this time   Over 30 minutes of exam, counseling, chart review, and critical decision making was performed  Future Appointments Date Time Provider [place]  05/08/2016 10:30 AM [name], MD [facility] None  01/31/2017 10:00 AM [name], MD [facility] None     Plan:   During the course of the visit the patient was educated and counseled about appropriate screening and preventive services including:    Pneumococcal vaccine   Influenza vaccine  Prevnar 13  Td vaccine  Screening electrocardiogram  Colorectal cancer screening  Diabetes screening  Glaucoma screening  Nutrition counseling    Subjective:  Robert Arnold is a 70 y.o. male who presents for Medicare Annual Wellness Visit and 3 month follow up for HTN, hyperlipidemia, diabetes, and vitamin D Def.   His blood pressure has been controlled at home, today their BP is BP: 138/80 He does not workout, works around the house. He denies chest pain, shortness of breath, dizziness.  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  He has had a dry cough, sinus drainage, weakness, fatigue x 1 day. Weight is up slightly, some PND with lying in bed 20 mins, gets up for 10 mins and then he is fine, denies edema.  He has history of Afib and is on coumadin, was changed last visit he is on 7.5mg  three days a week and 5 mg other days, no nose bleeds, blood stool/urine.  Lab Results  Component Value Date   INR 3.9 (H) 02/20/2016   INR 1.8 (H) 01/11/2016   INR 2.0 (H) 11/02/2015   He is on cholesterol medication and denies myalgias. His cholesterol is not at goal. The cholesterol last visit was:   Lab Results  Component Value Date   CHOL 173 01/11/2016   HDL 61 01/11/2016   LDLCALC 93 01/11/2016   TRIG 95 01/11/2016   CHOLHDL 2.8 01/11/2016   Has history of depression, in remission with  wellbutrin.  He has been working on diet and exercise for Diabetes with diabetic chronic kidney disease, he is on bASA, he is not on ACE/ARB, and denies paresthesia of the feet, polydipsia, polyuria and visual disturbances. Last A1C was:  Lab Results  Component Value Date   HGBA1C 6.4 (H) 01/11/2016   Last GFR Lab Results  Component Value Date   GFRNONAA 65 01/11/2016    Patient is on Vitamin D supplement.   Lab Results  Component Value Date   VD25OH 83 01/11/2016     BMI is Body mass index is 32.23 kg/m., he is working on diet and exercise. Wt Readings from Last 3 Encounters:  04/05/16 212 lb (96.2 kg)  03/13/16 208 lb (94.3 kg)  02/23/16 208 lb 6 oz (94.5 kg)    Medication Review: Current Outpatient Prescriptions on File Prior to Visit  Medication Sig Dispense Refill  . aspirin EC 81 MG tablet Take 81 mg by mouth daily.    . betamethasone dipropionate (DIPROLENE) 0.05 % cream Apply topically.    . Black Pepper-Turmeric (TURMERIC COMPLEX/BLACK PEPPER PO) Take 1,500 mg by mouth daily.    Marland Kitchen buPROPion (WELLBUTRIN XL) 300 MG 24 hr tablet TAKE ONE TABLET BY MOUTH EVERY MORNING 90 tablet 1  . Cholecalciferol (VITAMIN D PO) Take 1,000 Int'l Units by mouth daily.     . finasteride (PROSCAR) 5 MG tablet TAKE 1 TABLET BY MOUTH DAILY 90 tablet 1  . glucose blood (FREESTYLE TEST STRIPS) test strip Check blood sugar 3 to 4 times daily for medication regulation. 450 each PRN  . hydrOXYzine (VISTARIL) 25 MG capsule TAKE 1-2 CAPSULES BY MOUTH 2-4 TIMES DAILY AS NEEDED FOR ANXIETY 120 capsule 11  . insulin NPH-regular Human (NOVOLIN 70/30) (70-30) 100 UNIT/ML injection Take 20-30 units 2 x day or as directed 10 mL 99  . MAGNESIUM-OXIDE 400 (241.3 Mg) MG tablet TK 1 T PO BID  6  . mycophenolate (MYFORTIC) 360 MG TBEC EC tablet TAKE 1 TABLET BY MOUTH TWICE DAILY 180 tablet 0  . Probiotic Product (PROBIOTIC-10 ULTIMATE) CAPS Take 2 capsules by mouth 2 (two) times daily. 25 billiion    .  tacrolimus (PROGRAF) 0.5 MG capsule Take 0.5 mg by mouth 2 (two) times daily.   11  . tacrolimus (PROGRAF) 1 MG capsule Take 1 mg by mouth 2 (two) times daily.   2  . TRAVATAN Z 0.004 % SOLN ophthalmic solution Place 1 drop into both eyes at bedtime.     Marland Kitchen warfarin (COUMADIN) 5 MG tablet TAKE 1-2 TABLETS BY MOUTH DAILY AS DIRECTED 180 tablet 1  . atorvastatin (LIPITOR) 80 MG tablet Take 1/2 to 1 tablet daily or as directed for cholesterol 90 tablet 1   Current Facility-Administered Medications on File Prior to Visit  Medication Dose Route Frequency Provider Last Rate Last Dose  . 0.9 %  sodium chloride infusion  500 mL Intravenous Continuous Manus Gunning, MD        Allergies: Allergies  Allergen Reactions  . Crestor [Rosuvastatin]     Elevated LFT's  . Lorazepam   . 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[place]); Hyperlipidemia; BPH (benign prostatic hyperplasia); Gout; PAF (  paroxysmal atrial fibrillation) ([place]); OSA (obstructive sleep apnea); Histoplasmosis; Hypertension; Chronic kidney disease; Other forms of acute ischemic heart disease ([place]); Vitamin D deficiency; Medication management; Long term current use of anticoagulant therapy; Renal Transplant, s/p 09/2011; NSTEMI (non-ST elevated myocardial infarction) ([place]); Depression, major, in remission ([place]); Morbid obesity (BMI 30.32); Medicare annual wellness exam; Thrombocytopenia ([place]); and CAD (coronary artery disease) of artery bypass graft on his problem list.  Screening Tests Immunization History  Administered Date(s) Administered  . DT 06/05/2015  . DTaP 04/08/2008  . Hepatitis B 04/08/2009  . Influenza, High Dose Seasonal PF 12/27/2013, 01/17/2015, 01/11/2016  . Pneumococcal Conjugate-13 10/25/2013  . Pneumococcal Polysaccharide-23 06/05/2015  . Pneumococcal-Unspecified 04/08/2008   Preventative care: Last colonoscopy:  03/2016  Prior vaccinations: TD or Tdap: 2017  Influenza: 2017 Pneumococcal: 2017 Prevnar13: 2015 Shingles/Zostavax: can not have  Names of Other Physician/Practitioners you currently use: 1. [facility] here for primary care 2. Dr. Nicki Reaper eye doctor, last visit Nov 2017 3. Dr. Mirna Mires, dentist, last visit q 6 months, sept 2016 Patient Care Team: [name], MD as PCP - General (Internal Medicine) Estanislado Emms, MD as Consulting Physician (Nephrology) Jolaine Artist, MD as Consulting Physician (Cardiology) Estevan Ryder, MD as Referring Physician (Cardiology) Inda Castle, MD as Consulting Physician (Gastroenterology) Macarthur Critchley, OD as Referring Physician (Optometry)  Surgical: He  has a past surgical history that includes Colonoscopy; Peritoneal catheter insertion (2011); AV fistula placement (2011); Kidney transplant (09/25/2011); Coronary angioplasty with stent; and Coronary artery bypass graft (2013). Family His family history includes COPD in his mother; Diabetes in his mother; Heart attack in his father; Kidney disease in his sister. Social history  He reports that he quit smoking about 25 years ago. He has never used smokeless tobacco. He reports that he drinks alcohol. He reports that he does not use drugs.  MEDICARE WELLNESS OBJECTIVES: Physical activity:   Cardiac risk factors:   Depression/mood screen:   Depression screen [facility] 2/9 01/12/2016  Decreased Interest 0  Down, Depressed, Hopeless 0  PHQ - 2 Score 0    ADLs:  In your present state of health, do you have any difficulty performing the following activities: 01/12/2016 08/25/2015  Hearing? N N  Vision? N N  Difficulty concentrating or making decisions? N N  Walking or climbing stairs? N N  Dressing or bathing? N N  Doing errands, shopping? N N  Some recent data might be hidden     Cognitive Testing  Alert? Yes  Normal Appearance?Yes  Oriented to person?  Yes  Place? Yes   Time? Yes  Recall of three objects?  Yes  Can perform simple calculations? Yes  Displays appropriate judgment?Yes  Can read the correct time from a watch face?Yes  EOL planning: Does Patient Have a Medical Advance Directive?: Yes Type of Advance Directive: Healthcare Power of Attorney, Living will Copy of [place] in Chart?: No - copy requested   Objective:   Today's Vitals   04/05/16 0926  BP: 138/80  Pulse: 75  Resp: 16  Temp: 97.3 F (36.3 C)  SpO2: 98%  Weight: 212 lb (96.2 kg)  Height: 5\' 8"  (1.727 m)  PainSc: 7    Body mass index is 32.23 kg/m.  General appearance: alert, no distress, WD/WN, male HEENT: normocephalic, sclerae anicteric, TMs pearly, nares patent, no discharge or erythema, pharynx normal Oral cavity: MMM, no lesions Neck: supple, no lymphadenopathy, no thyromegaly, no masses Heart: RRR, normal  S1, S2,  3/6 systolic murmur Lungs: CTA bilaterally, no wheezes, rhonchi, or rales Abdomen: +bs, soft, non tender, non distended, no masses, no hepatomegaly, no splenomegaly Musculoskeletal: nontender, no swelling, no obvious deformity Extremities: no edema, no cyanosis, no clubbing. Left arm graft distally with thrill Pulses: 2+ symmetric, upper and lower extremities, normal cap refill Neurological: alert, oriented x 3, CN2-12 intact, strength normal upper extremities and lower extremities, sensation normal throughout, DTRs 2+ throughout, no cerebellar signs, gait normal Psychiatric: normal affect, behavior normal, pleasant   Medicare Attestation I have personally reviewed: The patient's medical and social history Their use of alcohol, tobacco or illicit drugs Their current medications and supplements The patient's functional ability including ADLs,fall risks, home safety risks, cognitive, and hearing and visual impairment Diet and physical activities Evidence for depression or mood disorders  The patient's weight,  height, BMI, and visual acuity have been recorded in the chart.  I have made referrals, counseling, and provided education to the patient based on review of the above and I have provided the patient with a written personalized care plan for preventive services.     Vicie Mutters, PA-C   04/05/2016

## 2016-04-05 NOTE — Patient Instructions (Signed)
Get on allergy pill Get on flonase Take tums or zantac before bed No eating 2-3 hours before bed Decrease salt/monitor fluid intake and weight  Any worsening SOB go to ER  - Try the Flonase or Nasonex. Remember to spray each nostril twice towards the outer part of your eye.  Do not sniff but instead pinch your nose and tilt your head back to help the medicine get into your sinuses.  The best time to do this is at bedtime.Stop if you get blurred vision or nose bleeds.  -While drinking fluids, pinch and hold nose close and swallow, to help open eustachian tubes to drain fluid behind ear drums. -Please pick one of the over the counter allergy medications below and take it once daily for allergies.  It will also help with fluid behind ear drums. Claritin or loratadine cheapest but likely the weakest  Zyrtec or certizine at night because it can make you sleepy The strongest is allegra or fexafinadine  Cheapest at [facility], [employer], [employer] -can use decongestant over the counter, please do not use if you have high blood pressure or certain heart conditions.   If not better will send in antibiotic

## 2016-04-09 ENCOUNTER — Ambulatory Visit (HOSPITAL_COMMUNITY)
Admission: RE | Admit: 2016-04-09 | Discharge: 2016-04-09 | Disposition: A | Discharge status: Home / Self Care | Payer: Medicare Other | Source: Ambulatory Visit | Attending: Physician Assistant | Admitting: Physician Assistant

## 2016-04-09 ENCOUNTER — Encounter: Payer: Self-pay | Admitting: Physician Assistant

## 2016-04-09 ENCOUNTER — Other Ambulatory Visit: Payer: Self-pay | Admitting: Physician Assistant

## 2016-04-09 DIAGNOSIS — J8410 Pulmonary fibrosis, unspecified: Secondary | ICD-10-CM | POA: Insufficient documentation

## 2016-04-09 DIAGNOSIS — R5383 Other fatigue: Secondary | ICD-10-CM | POA: Diagnosis present

## 2016-04-09 DIAGNOSIS — Z951 Presence of aortocoronary bypass graft: Secondary | ICD-10-CM | POA: Insufficient documentation

## 2016-04-09 DIAGNOSIS — R059 Cough, unspecified: Secondary | ICD-10-CM

## 2016-04-09 DIAGNOSIS — R05 Cough: Secondary | ICD-10-CM | POA: Diagnosis not present

## 2016-04-09 MED ORDER — BENZONATATE 100 MG PO CAPS: 200.0000 mg | ORAL_CAPSULE | Freq: Three times a day (TID) | ORAL | 0 refills | Status: DC | PRN

## 2016-04-10 ENCOUNTER — Other Ambulatory Visit: Payer: Self-pay | Admitting: Physician Assistant

## 2016-04-10 NOTE — Progress Notes (Signed)
Future Appointments Date Time Provider [place]  05/08/2016 10:30 AM [name], MD [facility] None  01/31/2017 10:00 AM [name], MD [facility] None

## 2016-04-11 ENCOUNTER — Encounter: Payer: Self-pay | Admitting: Physician Assistant

## 2016-04-11 MED ORDER — PREDNISONE 20 MG PO TABS: ORAL_TABLET | ORAL | 0 refills | Status: DC

## 2016-04-11 MED ORDER — PROMETHAZINE-CODEINE 6.25-10 MG/5ML PO SYRP: 5.0000 mL | ORAL_SOLUTION | Freq: Four times a day (QID) | ORAL | 0 refills | Status: DC | PRN

## 2016-04-11 MED ORDER — DOXYCYCLINE HYCLATE 100 MG PO CAPS: ORAL_CAPSULE | ORAL | 0 refills | Status: DC

## 2016-04-22 ENCOUNTER — Telehealth: Payer: Self-pay | Admitting: Internal Medicine

## 2016-04-22 ENCOUNTER — Other Ambulatory Visit: Payer: Self-pay | Admitting: Physician Assistant

## 2016-04-22 MED ORDER — HYOSCYAMINE SULFATE 0.125 MG PO TABS: 0.1250 mg | ORAL_TABLET | ORAL | 1 refills | Status: DC | PRN

## 2016-04-22 NOTE — Telephone Encounter (Signed)
patient states, vomiting and diarrhea all day, please call in rx to [facility], if no better will schedule appointment in morning. Can't leave house in this condition.

## 2016-04-22 NOTE — Telephone Encounter (Signed)
Spoke with patient and wife, has had 1 day of diarrhea/vomiting, no AB pain, some gingerale/shaking. Sent in levsin but told wife and patient to go to the ER for fluids if he is unable to hold anything down, has decreased urine, dizziness, weakness they need to go to the ER

## 2016-04-22 NOTE — Addendum Note (Signed)
Addended by: Vicie Mutters R on: 04/22/2016 05:40 PM   Modules accepted: Orders

## 2016-04-28 ENCOUNTER — Other Ambulatory Visit: Payer: Self-pay | Admitting: Internal Medicine

## 2016-04-30 ENCOUNTER — Telehealth: Payer: Self-pay | Admitting: *Deleted

## 2016-04-30 NOTE — Telephone Encounter (Signed)
Patient was called to inform him that Hydroxyzine is not his formulary.  Per Dr Melford Aase, the patient can get the RX through the [facility] program for $10.49. The patient will call back in regard to where to send the local RX.

## 2016-05-06 ENCOUNTER — Other Ambulatory Visit: Payer: Self-pay | Admitting: Physician Assistant

## 2016-05-08 ENCOUNTER — Encounter: Payer: Self-pay | Admitting: Internal Medicine

## 2016-05-08 ENCOUNTER — Ambulatory Visit (INDEPENDENT_AMBULATORY_CARE_PROVIDER_SITE_OTHER): Payer: Medicare Other | Admitting: Internal Medicine

## 2016-05-08 VITALS — BP 146/78 | HR 76 | Temp 97.3°F | Resp 16 | Ht 68.0 in | Wt 201.0 lb

## 2016-05-08 DIAGNOSIS — E1022 Type 1 diabetes mellitus with diabetic chronic kidney disease: Secondary | ICD-10-CM | POA: Diagnosis not present

## 2016-05-08 DIAGNOSIS — I480 Paroxysmal atrial fibrillation: Secondary | ICD-10-CM | POA: Diagnosis not present

## 2016-05-08 DIAGNOSIS — Z79899 Other long term (current) drug therapy: Secondary | ICD-10-CM

## 2016-05-08 DIAGNOSIS — Z940 Kidney transplant status: Secondary | ICD-10-CM

## 2016-05-08 DIAGNOSIS — I25708 Atherosclerosis of coronary artery bypass graft(s), unspecified, with other forms of angina pectoris: Secondary | ICD-10-CM | POA: Diagnosis not present

## 2016-05-08 DIAGNOSIS — I10 Essential (primary) hypertension: Secondary | ICD-10-CM

## 2016-05-08 DIAGNOSIS — E559 Vitamin D deficiency, unspecified: Secondary | ICD-10-CM

## 2016-05-08 DIAGNOSIS — Z7901 Long term (current) use of anticoagulants: Secondary | ICD-10-CM

## 2016-05-08 DIAGNOSIS — E782 Mixed hyperlipidemia: Secondary | ICD-10-CM | POA: Diagnosis not present

## 2016-05-08 DIAGNOSIS — N182 Chronic kidney disease, stage 2 (mild): Secondary | ICD-10-CM

## 2016-05-08 DIAGNOSIS — I2510 Atherosclerotic heart disease of native coronary artery without angina pectoris: Secondary | ICD-10-CM | POA: Insufficient documentation

## 2016-05-08 DIAGNOSIS — M1000 Idiopathic gout, unspecified site: Secondary | ICD-10-CM

## 2016-05-08 LAB — PROTIME-INR
INR: 2.4 — ABNORMAL HIGH
PROTHROMBIN TIME: 24.4 s — AB (ref 9.0–11.5)

## 2016-05-08 NOTE — Progress Notes (Signed)
[facility] [name], M.D.        [name]. [name], P.A.-C       [name], P.A.-C  [facility]                [address] [place], [place] [address] Telephone [phone] Telefax [phone] ______________________________________________________________________     This very nice 71 y.o. MWM presents for 6 month follow up with HTN, ASCAD/CABG/Afib, insulin req T2_DM, HLD and Vit D Deficiency.     Patient is treated for HTN (1998) & BP has been controlled at home. Today's BP is borderline up at 146/78.  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  Post Op he has ACS undergoing PCA/Stenting and ultimately underwent CABG in Nov 2013. Patient also has since been on Coumadin for cAfib since his transplant surg. Patient has had no complaints of any cardiac type chest pain, palpitations, dyspnea/orthopnea/PND, dizziness, claudication, or dependent edema.     Hyperlipidemia is controlled with diet & meds. Patient denies myalgias or other med SE's. Last Lipids were  Lab Results  Component Value Date   CHOL 173 01/11/2016   HDL 61 01/11/2016   LDLCALC 93 01/11/2016   TRIG 95 01/11/2016   CHOLHDL 2.8 01/11/2016      Also, the patient has history of  Insulin requiring T2_DM and has had no symptoms of reactive hypoglycemia, diabetic polys, paresthesias or visual blurring.  Last A1c was  Lab Results  Component Value Date   HGBA1C 6.4 (H) 01/11/2016      Further, the patient also has history of Vitamin D Deficiency  in 2014 of  "24" and supplements vitamin D without any suspected side-effects. Last vitamin D was   Lab Results  Component Value Date   VD25OH 34 01/11/2016   Current Outpatient Prescriptions on File Prior to Visit  Medication Sig  . aspirin EC 81 MG tablet Take   daily.  Marland Kitchen DIPROLENE 0.05 % cream Apply topically.  Renard Hamper Pepper-Turmeric 1,500 mg  Take  daily.  Marland Kitchen buPROPion-XL 300 MG  TAKE ONE TABLET BY MOUTH EVERY MORNING  . VITAMIN D Take 1,000 Int'l Units by mouth daily.   . finasteride  5 MG tablet TAKE 1 TABLET BY MOUTH DAILY  . hydrOXYzine  25 MG capsule TAKE 1 TO 2 CAP 2 TO 4 TIMES DAILY AS NEEDED   . iNOVOLIN 70/30 UNIT/ML injection Take 20-30 units 2 x day or as directed  . XALATAN ophthsoln INT 1 GTT IN EACH EYE QHS  . MAGNESIUM-OXIDE 400 mg TK 1 T PO BID  . Mycophenolate/MYFORTIC 360 mg  TAKE 1 TABLET BY MOUTH TWICE DAILY  . tacrolimus (PROGRAF) 1 MG  Take 1 mg by mouth 2 (two) times daily.   [name]  ophth soln Place 1 drop into both eyes at bedtime.   Marland Kitchen warfarin  5 MG tablet TAKE 1-2 TABLETS BY MOUTH DAILY AS DIRECTED  . atorvastatin  80 MG tablet Take 1/2 to 1 tablet daily or as directed for cholesterol   Allergies  Allergen Reactions  . Crestor [Rosuvastatin]     Elevated LFT's  . Lorazepam   . Losartan    PMHx:   Past Medical History:  Diagnosis Date  . Adenomatous colon polyp   .  Allergy   . Anxiety   . Blood transfusion without reported diagnosis   . BPH (benign prostatic hyperplasia)   . CAD (coronary artery disease)    s/p post-op NSTEMI. treated with stent to LAD. at [facility]  . Cataract   . CHF (congestive heart failure) ([place])   . 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 Diabetes mellitus type 2, controlled ([place])   . Glaucoma   . Gout   . Heart murmur   . 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 HTN (hypertension)   . Hyperlipidemia   . MI (myocardial infarction) 09/26/2011  . OSA (obstructive sleep apnea)   . PAF (paroxysmal atrial fibrillation) ([place])    s/p DC-CV in 6/13. Off coumadin due to ureteral bleed   Immunization History  Administered Date(s) Administered  . DT 06/05/2015  . DTaP 04/08/2008  . Hepatitis B 04/08/2009  .  Influenza, High Dose Seasonal PF 12/27/2013, 01/17/2015, 01/11/2016  . Pneumococcal Conjugate-13 10/25/2013  . Pneumococcal Polysaccharide-23 06/05/2015  . Pneumococcal-Unspecified 04/08/2008   Past Surgical History:  Procedure Laterality Date  . AV FISTULA PLACEMENT  2011   Left forearm  . COLONOSCOPY    . CORONARY ANGIOPLASTY WITH STENT PLACEMENT    . CORONARY ARTERY BYPASS GRAFT  2013  . KIDNEY TRANSPLANT  09/25/2011  . PERITONEAL CATHETER INSERTION  2011   FHx:    Reviewed / unchanged  SHx:    Reviewed / unchanged  Systems Review:  Constitutional: Denies fever, chills, wt changes, headaches, insomnia, fatigue, night sweats, change in appetite. Eyes: Denies redness, blurred vision, diplopia, discharge, itchy, watery eyes.  ENT: Denies discharge, congestion, post nasal drip, epistaxis, sore throat, earache, hearing loss, dental pain, tinnitus, vertigo, sinus pain, snoring.  CV: Denies chest pain, palpitations, irregular heartbeat, syncope, dyspnea, diaphoresis, orthopnea, PND, claudication or edema. Respiratory: denies cough, dyspnea, DOE, pleurisy, hoarseness, laryngitis, wheezing.  Gastrointestinal: Denies dysphagia, odynophagia, heartburn, reflux, water brash, abdominal pain or cramps, nausea, vomiting, bloating, diarrhea, constipation, hematemesis, melena, hematochezia  or hemorrhoids. Genitourinary: Denies dysuria, frequency, urgency, nocturia, hesitancy, discharge, hematuria or flank pain. Musculoskeletal: Denies arthralgias, myalgias, stiffness, jt. swelling, pain, limping or strain/sprain.  Skin: Denies pruritus, rash, hives, warts, acne, eczema or change in skin lesion(s). Neuro: No weakness, tremor, incoordination, spasms, paresthesia or pain. Psychiatric: Denies confusion, memory loss or sensory loss. Endo: Denies change in weight, skin or hair change.  Heme/Lymph: No excessive bleeding, bruising or enlarged lymph nodes.  Physical Exam  BP (!) 146/78   Pulse 76    Temp 97.3 F (36.3 C)   Resp 16   Ht 5\' 8"  (1.727 m)   Wt 201 lb (91.2 kg)   BMI 30.56 kg/m   Appears over nourished and in no distress.  Eyes: PERRLA, EOMs, conjunctiva no swelling or erythema. Sinuses: No frontal/maxillary tenderness ENT/Mouth: EAC's clear, TM's nl w/o erythema, bulging. Nares clear w/o erythema, swelling, exudates. Oropharynx clear without erythema or exudates. Oral hygiene is good. Tongue normal, non obstructing. Hearing intact.  Neck: Supple. Thyroid nl. Car 2+/2+ without bruits, nodes or JVD. Chest: Respirations nl with BS clear & equal w/o rales, rhonchi, wheezing or stridor.  Cor: Heart sounds normal w/ regular rate and rhythm without sig. murmurs, gallops, clicks, or rubs. Peripheral pulses normal and equal  without edema.  Abdomen: Soft & bowel sounds normal. Non-tender w/o guarding, rebound, hernias, masses, or organomegaly.  Lymphatics: Unremarkable.  Musculoskeletal: Full ROM  all peripheral extremities, joint stability, 5/5 strength, and normal gait.  Skin: Warm, dry without exposed rashes, lesions or ecchymosis apparent.  Neuro: Cranial nerves intact, reflexes equal bilaterally. Sensory-motor testing grossly intact. Tendon reflexes grossly intact.  Pysch: Alert & oriented x 3.  Insight and judgement nl & appropriate. No ideations.  Assessment and Plan:  1. Essential hypertension  - Continue medication, monitor blood pressure at home.  - Continue DASH diet. Reminder to go to the ER if any CP,  SOB, nausea, dizziness, severe HA, changes vision/speech,  left arm numbness and tingling and jaw pain. - CBC with Differential/Platelet - BASIC METABOLIC PANEL WITH GFR - TSH  2. Mixed hyperlipidemia  - Continue diet/meds, exercise,& lifestyle modifications.  - Continue monitor periodic cholesterol/liver & renal functions  - Hepatic function panel - Lipid panel - TSH  3. 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  - Monitor appropriate labs. - Hemoglobin A1c  4. Vitamin D deficiency  - Continue supplementation. - VITAMIN D 25 Hydroxy   5. PAF (paroxysmal atrial fibrillation) ([place])  - Protime-INR  6. Coronary artery disease involving coronary bypass graft of native heart with other forms of angina pectoris ([place])   7. Renal Transplant, s/p 09/2011   8. Idiopathic gout  - Uric acid  9. Long term current use of anticoagulant therapy  - Protime-INR  10. Medication management  - CBC with Differential/Platelet - BASIC METABOLIC PANEL WITH GFR - Hepatic function panel - Magnesium - Lipid panel - Hemoglobin A1c - VITAMIN D 25 Hydroxy  - Uric acid        Recommended regular exercise, BP monitoring, weight control, and discussed med and SE's. Recommended labs to assess and monitor clinical status. Further disposition pending results of labs. Over 30 minutes of exam, counseling, chart review was performed

## 2016-05-08 NOTE — Patient Instructions (Signed)

## 2016-05-09 ENCOUNTER — Other Ambulatory Visit: Payer: Self-pay | Admitting: Internal Medicine

## 2016-05-09 DIAGNOSIS — N1831 Chronic kidney disease, stage 3a: Secondary | ICD-10-CM

## 2016-05-09 DIAGNOSIS — N028 Recurrent and persistent hematuria with other morphologic changes: Secondary | ICD-10-CM | POA: Insufficient documentation

## 2016-05-09 DIAGNOSIS — N183 Chronic kidney disease, stage 3 (moderate): Principal | ICD-10-CM

## 2016-05-09 DIAGNOSIS — Z79899 Other long term (current) drug therapy: Secondary | ICD-10-CM

## 2016-05-09 LAB — CBC WITH DIFFERENTIAL/PLATELET
BASOS ABS: 55 {cells}/uL (ref 0–200)
Basophils Relative: 1 %
EOS PCT: 2 %
Eosinophils Absolute: 110 cells/uL (ref 15–500)
HCT: 42.2 % (ref 38.5–50.0)
Hemoglobin: 14.3 g/dL (ref 13.2–17.1)
LYMPHS ABS: 1375 {cells}/uL (ref 850–3900)
LYMPHS PCT: 25 %
MCH: 29.8 pg (ref 27.0–33.0)
MCHC: 33.9 g/dL (ref 32.0–36.0)
MCV: 87.9 fL (ref 80.0–100.0)
MONOS PCT: 8 %
MPV: 9.5 fL (ref 7.5–12.5)
Monocytes Absolute: 440 cells/uL (ref 200–950)
NEUTROS PCT: 64 %
Neutro Abs: 3520 cells/uL (ref 1500–7800)
PLATELETS: 136 10*3/uL — AB (ref 140–400)
RBC: 4.8 MIL/uL (ref 4.20–5.80)
RDW: 13.7 % (ref 11.0–15.0)
WBC: 5.5 10*3/uL (ref 3.8–10.8)

## 2016-05-09 LAB — BASIC METABOLIC PANEL WITH GFR
BUN: 28 mg/dL — ABNORMAL HIGH (ref 7–25)
CALCIUM: 9.6 mg/dL (ref 8.6–10.3)
CO2: 24 mmol/L (ref 20–31)
Chloride: 104 mmol/L (ref 98–110)
Creat: 1.4 mg/dL — ABNORMAL HIGH (ref 0.70–1.25)
GFR, EST AFRICAN AMERICAN: 59 mL/min — AB (ref >=60)
GFR, EST NON AFRICAN AMERICAN: 51 mL/min — AB (ref >=60)
Glucose, Bld: 137 mg/dL — ABNORMAL HIGH (ref 65–99)
Potassium: 4.5 mmol/L (ref 3.5–5.3)
SODIUM: 140 mmol/L (ref 135–146)

## 2016-05-09 LAB — HEPATIC FUNCTION PANEL
ALT: 18 U/L (ref 9–46)
AST: 23 U/L (ref 10–35)
Albumin: 4.2 g/dL (ref 3.6–5.1)
Alkaline Phosphatase: 80 U/L (ref 40–115)
BILIRUBIN DIRECT: 0.3 mg/dL — AB (ref <=0.2)
BILIRUBIN INDIRECT: 1.5 mg/dL — AB (ref 0.2–1.2)
Total Bilirubin: 1.8 mg/dL — ABNORMAL HIGH (ref 0.2–1.2)
Total Protein: 7 g/dL (ref 6.1–8.1)

## 2016-05-09 LAB — LIPID PANEL
Cholesterol: 165 mg/dL (ref <200)
HDL: 60 mg/dL (ref >40)
LDL Cholesterol: 84 mg/dL (ref <100)
Total CHOL/HDL Ratio: 2.8 Ratio (ref <5.0)
Triglycerides: 105 mg/dL (ref <150)
VLDL: 21 mg/dL (ref <30)

## 2016-05-09 LAB — VITAMIN D 25 HYDROXY (VIT D DEFICIENCY, FRACTURES): VIT D 25 HYDROXY: 99 ng/mL (ref 30–100)

## 2016-05-09 LAB — MAGNESIUM: MAGNESIUM: 1.6 mg/dL (ref 1.5–2.5)

## 2016-05-09 LAB — URIC ACID: Uric Acid, Serum: 8.3 mg/dL — ABNORMAL HIGH (ref 4.0–8.0)

## 2016-05-09 LAB — TSH: TSH: 2.23 m[IU]/L (ref 0.40–4.50)

## 2016-05-09 LAB — HEMOGLOBIN A1C
HEMOGLOBIN A1C: 6.9 % — AB (ref <5.7)
Mean Plasma Glucose: 151 mg/dL

## 2016-05-13 ENCOUNTER — Other Ambulatory Visit: Payer: Self-pay | Admitting: Internal Medicine

## 2016-05-13 ENCOUNTER — Encounter: Payer: Self-pay | Admitting: Internal Medicine

## 2016-05-13 DIAGNOSIS — Z940 Kidney transplant status: Secondary | ICD-10-CM

## 2016-05-23 ENCOUNTER — Ambulatory Visit (INDEPENDENT_AMBULATORY_CARE_PROVIDER_SITE_OTHER): Payer: Medicare Other | Admitting: *Deleted

## 2016-05-23 DIAGNOSIS — N028 Recurrent and persistent hematuria with other morphologic changes: Secondary | ICD-10-CM | POA: Diagnosis not present

## 2016-05-23 DIAGNOSIS — N183 Chronic kidney disease, stage 3 (moderate): Secondary | ICD-10-CM | POA: Diagnosis not present

## 2016-05-23 DIAGNOSIS — N1831 Chronic kidney disease, stage 3a: Secondary | ICD-10-CM

## 2016-05-23 DIAGNOSIS — Z79899 Other long term (current) drug therapy: Secondary | ICD-10-CM

## 2016-05-23 DIAGNOSIS — Z940 Kidney transplant status: Secondary | ICD-10-CM | POA: Diagnosis not present

## 2016-05-23 LAB — BASIC METABOLIC PANEL WITH GFR
BUN: 15 mg/dL (ref 7–25)
CALCIUM: 9.6 mg/dL (ref 8.6–10.3)
CO2: 22 mmol/L (ref 20–31)
Chloride: 104 mmol/L (ref 98–110)
Creat: 1.17 mg/dL (ref 0.70–1.25)
GFR, EST NON AFRICAN AMERICAN: 64 mL/min (ref >=60)
GFR, Est African American: 74 mL/min (ref >=60)
Glucose, Bld: 128 mg/dL — ABNORMAL HIGH (ref 65–99)
POTASSIUM: 4.4 mmol/L (ref 3.5–5.3)
SODIUM: 139 mmol/L (ref 135–146)

## 2016-05-24 LAB — IGG, IGA, IGM
IgA: 296 mg/dL (ref 81–463)
IgG (Immunoglobin G), Serum: 790 mg/dL (ref 694–1618)
IgM, Serum: 63 mg/dL (ref 48–271)

## 2016-05-24 LAB — TACROLIMUS LEVEL: Tacrolimus Lvl: 4.4 ng/mL — ABNORMAL LOW (ref 5.0–20.0)

## 2016-06-06 ENCOUNTER — Ambulatory Visit (INDEPENDENT_AMBULATORY_CARE_PROVIDER_SITE_OTHER): Payer: Medicare Other | Admitting: Internal Medicine

## 2016-06-06 ENCOUNTER — Encounter: Payer: Self-pay | Admitting: Internal Medicine

## 2016-06-06 VITALS — BP 130/82 | HR 58 | Temp 97.8°F | Resp 16 | Ht 68.0 in | Wt 204.0 lb

## 2016-06-06 DIAGNOSIS — I480 Paroxysmal atrial fibrillation: Secondary | ICD-10-CM

## 2016-06-06 DIAGNOSIS — Z7901 Long term (current) use of anticoagulants: Secondary | ICD-10-CM

## 2016-06-06 DIAGNOSIS — I25708 Atherosclerosis of coronary artery bypass graft(s), unspecified, with other forms of angina pectoris: Secondary | ICD-10-CM

## 2016-06-06 LAB — PROTIME-INR
INR: 2.2 — AB
Prothrombin Time: 23 s — ABNORMAL HIGH (ref 9.0–11.5)

## 2016-06-06 NOTE — Progress Notes (Signed)
Assessment and Plan:   1. PAF (paroxysmal atrial fibrillation) (HCC) -cont rate control -cont coumadin -dose adjust as necessary - Protime-INR  2. Long term current use of anticoagulant therapy -cont couamdin -dose adjust as necessary     HPI 71 y.o.male presents for coumadin recheck.  He is currently doing 7.5 mg x 3 days and then 5 mg for 4 days.  He reports no excessive bleeding or bruising.  He has not had falls.  He has not had any head injury.  He has not had any recent antibiotic use.  He denies headaches, palpatitations, CP, SOB, hematochezia, melena, or epistaxis.  Past Medical History:  Diagnosis Date  . Adenomatous colon polyp   . Allergy   . BPH (benign prostatic hyperplasia)   . CHF (congestive heart failure) ([place])   . 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 Diabetes mellitus type 2, controlled ([place])   . Glaucoma   . Gout   . 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 MI (myocardial infarction) 09/26/2011  . OSA (obstructive sleep apnea)   . PAF (paroxysmal atrial fibrillation) ([place])    s/p DC-CV in 6/13. Off coumadin due to ureteral bleed     Allergies  Allergen Reactions  . Crestor [Rosuvastatin]     Elevated LFT's  . Lorazepam   . Losartan       Current Outpatient Prescriptions on File Prior to Visit  Medication Sig Dispense Refill  . aspirin EC 81 MG tablet Take 81 mg by mouth daily.    Marland Kitchen atorvastatin (LIPITOR) 80 MG tablet Take 1/2 to 1 tablet daily or as directed for cholesterol 90 tablet 1  . benzonatate (TESSALON PERLES) 100 MG capsule Take 2 capsules (200 mg total) by mouth 3 (three) times daily as needed for cough (Max: 600mg  per day). 60 capsule 0  . betamethasone dipropionate (DIPROLENE) 0.05 % cream Apply topically.    . Black Pepper-Turmeric (TURMERIC COMPLEX/BLACK PEPPER PO) Take 1,500 mg by mouth daily.    Marland Kitchen buPROPion (WELLBUTRIN XL) 300 MG 24 hr tablet TAKE ONE TABLET BY MOUTH EVERY  MORNING 90 tablet 1  . Cholecalciferol (VITAMIN D PO) Take 1,000 Int'l Units by mouth daily.     . finasteride (PROSCAR) 5 MG tablet TAKE 1 TABLET BY MOUTH DAILY 90 tablet 1  . glucose blood (FREESTYLE TEST STRIPS) test strip Check blood sugar 3 to 4 times daily for medication regulation. 450 each PRN  . hydrOXYzine (VISTARIL) 25 MG capsule TAKE 1 TO 2 CAPSULES BY MOUTH 2 TO 4 TIMES DAILY AS NEEDED FOR ANXIETY 120 capsule 3  . insulin NPH-regular Human (NOVOLIN 70/30) (70-30) 100 UNIT/ML injection Take 20-30 units 2 x day or as directed 10 mL 99  . latanoprost (XALATAN) 0.005 % ophthalmic solution INT 1 GTT IN EACH EYE QHS  4  . MAGNESIUM-OXIDE 400 (241.3 Mg) MG tablet TK 1 T PO BID  6  . mycophenolate (MYFORTIC) 360 MG TBEC EC tablet TAKE 1 TABLET BY MOUTH TWICE DAILY 180 tablet 1  . tacrolimus (PROGRAF) 1 MG capsule Take 1 mg by mouth 2 (two) times daily.   2  . TRAVATAN Z 0.004 % SOLN ophthalmic solution Place 1 drop into both eyes at bedtime.     Marland Kitchen warfarin (COUMADIN) 5 MG tablet TAKE 1-2 TABLETS BY MOUTH DAILY AS DIRECTED 180 tablet 1   No current facility-administered medications on file prior to visit.  ROS: all negative except above.   Physical Exam: Filed Weights   06/06/16 1028  Weight: 204 lb (92.5 kg)   BP 130/82   Pulse (!) 58   Temp 97.8 F (36.6 C) (Temporal)   Resp 16   Ht 5\' 8"  (1.727 m)   Wt 204 lb (92.5 kg)   BMI 31.02 kg/m  General Appearance: Well developed well nourished, non-toxic appearing in no apparent distress. Eyes: PERRLA, EOMs, conjunctiva w/ no swelling or erythema or discharge Sinuses: No Frontal/maxillary tenderness ENT/Mouth: Ear canals clear without swelling or erythema.  TM's normal bilaterally with no retractions, bulging, or loss of landmarks.   Neck: Supple, thyroid normal, no notable JVD  Respiratory: Respiratory effort normal, Clear breath sounds anteriorly and posteriorly bilaterally without rales, rhonchi, wheezing or stridor. No  retractions or accessory muscle usage. Cardio: Ireg ireg, with no MRGs.   Abdomen: Soft, + BS.  Non tender, no guarding, rebound, hernias, masses.  Musculoskeletal: Full ROM, 5/5 strength, normal gait.  Skin: Warm, dry without rashes  Neuro: Awake and oriented X 3, Cranial nerves intact. Normal muscle tone, no cerebellar symptoms. Sensation intact.  Psych: normal affect, Insight and Judgment appropriate.     [name], PA-C 10:45 AM [facility]

## 2016-06-27 ENCOUNTER — Encounter: Payer: Self-pay | Admitting: Internal Medicine

## 2016-07-07 ENCOUNTER — Other Ambulatory Visit: Payer: Self-pay | Admitting: Internal Medicine

## 2016-07-10 ENCOUNTER — Ambulatory Visit (INDEPENDENT_AMBULATORY_CARE_PROVIDER_SITE_OTHER): Payer: Medicare Other | Admitting: Physician Assistant

## 2016-07-10 ENCOUNTER — Encounter: Payer: Self-pay | Admitting: Physician Assistant

## 2016-07-10 VITALS — BP 124/86 | HR 76 | Temp 97.5°F | Resp 14 | Ht 68.0 in | Wt 205.0 lb

## 2016-07-10 DIAGNOSIS — I5022 Chronic systolic (congestive) heart failure: Secondary | ICD-10-CM | POA: Diagnosis not present

## 2016-07-10 DIAGNOSIS — F325 Major depressive disorder, single episode, in full remission: Secondary | ICD-10-CM

## 2016-07-10 DIAGNOSIS — I480 Paroxysmal atrial fibrillation: Secondary | ICD-10-CM

## 2016-07-10 DIAGNOSIS — E1022 Type 1 diabetes mellitus with diabetic chronic kidney disease: Secondary | ICD-10-CM

## 2016-07-10 DIAGNOSIS — Z794 Long term (current) use of insulin: Secondary | ICD-10-CM

## 2016-07-10 DIAGNOSIS — Z7901 Long term (current) use of anticoagulants: Secondary | ICD-10-CM | POA: Diagnosis not present

## 2016-07-10 DIAGNOSIS — N182 Chronic kidney disease, stage 2 (mild): Secondary | ICD-10-CM

## 2016-07-10 DIAGNOSIS — D696 Thrombocytopenia, unspecified: Secondary | ICD-10-CM

## 2016-07-10 LAB — PROTIME-INR
INR: 2.6 — ABNORMAL HIGH
Prothrombin Time: 26.2 s — ABNORMAL HIGH (ref 9.0–11.5)

## 2016-07-10 LAB — BASIC METABOLIC PANEL WITH GFR
BUN: 27 mg/dL — ABNORMAL HIGH (ref 7–25)
CO2: 20 mmol/L (ref 20–31)
Calcium: 9.5 mg/dL (ref 8.6–10.3)
Chloride: 106 mmol/L (ref 98–110)
Creat: 1.18 mg/dL (ref 0.70–1.25)
GFR, EST AFRICAN AMERICAN: 73 mL/min (ref >=60)
GFR, EST NON AFRICAN AMERICAN: 63 mL/min (ref >=60)
GLUCOSE: 123 mg/dL — AB (ref 65–99)
POTASSIUM: 4.4 mmol/L (ref 3.5–5.3)
Sodium: 140 mmol/L (ref 135–146)

## 2016-07-10 LAB — CBC WITH DIFFERENTIAL/PLATELET
BASOS PCT: 1 %
Basophils Absolute: 42 cells/uL (ref 0–200)
Eosinophils Absolute: 42 cells/uL (ref 15–500)
Eosinophils Relative: 1 %
HEMATOCRIT: 44.1 % (ref 38.5–50.0)
HEMOGLOBIN: 14.3 g/dL (ref 13.2–17.1)
LYMPHS ABS: 1218 {cells}/uL (ref 850–3900)
Lymphocytes Relative: 29 %
MCH: 28.6 pg (ref 27.0–33.0)
MCHC: 32.4 g/dL (ref 32.0–36.0)
MCV: 88.2 fL (ref 80.0–100.0)
MONO ABS: 420 {cells}/uL (ref 200–950)
MPV: 9.3 fL (ref 7.5–12.5)
Monocytes Relative: 10 %
NEUTROS ABS: 2478 {cells}/uL (ref 1500–7800)
Neutrophils Relative %: 59 %
Platelets: 138 10*3/uL — ABNORMAL LOW (ref 140–400)
RBC: 5 MIL/uL (ref 4.20–5.80)
RDW: 13.5 % (ref 11.0–15.0)
WBC: 4.2 10*3/uL (ref 3.8–10.8)

## 2016-07-10 NOTE — Progress Notes (Signed)
Coumadin follow up  Patient is on Coumadin for PAF (paroxysmal atrial fibrillation) ([place]) [I48.0] Patient's last INR is  Lab Results  Component Value Date   INR 2.2 (H) 06/06/2016   INR 2.4 (H) 05/08/2016   INR 2.2 (H) 04/05/2016    Patient denies SOB, CP, dizziness, nose bleeds, easy bleeding, and blood in stool/urine. He is on 7.5 M,W,F and 5mg  the rest.  His coumadin dose was changed last visit. He has not taken ABX, has not missed any doses and denies a fall.   BMI is Body mass index is 31.17 kg/m., he is working on diet and exercise, denies PND, orthopnea, weight is stable. He has DM with CKD, states sugars have been controlled at home, has had some low sugars 1 at night that woke him up and 2 in the morning, lowest 65, denies hypoglycemia.  Wt Readings from Last 3 Encounters:  07/10/16 205 lb (93 kg)  06/06/16 204 lb (92.5 kg)  05/08/16 201 lb (91.2 kg)     Current Outpatient Prescriptions on File Prior to Visit  Medication Sig Dispense Refill  . aspirin EC 81 MG tablet Take 81 mg by mouth daily.    . betamethasone dipropionate (DIPROLENE) 0.05 % cream Apply topically.    . Black Pepper-Turmeric (TURMERIC COMPLEX/BLACK PEPPER PO) Take 1,500 mg by mouth daily.    Marland Kitchen buPROPion (WELLBUTRIN XL) 300 MG 24 hr tablet TAKE ONE TABLET BY MOUTH EVERY MORNING 90 tablet 1  . Cholecalciferol (VITAMIN D PO) Take 1,000 Int'l Units by mouth daily.     . finasteride (PROSCAR) 5 MG tablet TAKE 1 TABLET BY MOUTH DAILY 90 tablet 0  . glucose blood (FREESTYLE TEST STRIPS) test strip Check blood sugar 3 to 4 times daily for medication regulation. 450 each PRN  . hydrOXYzine (VISTARIL) 25 MG capsule TAKE 1 TO 2 CAPSULES BY MOUTH 2 TO 4 TIMES DAILY AS NEEDED FOR ANXIETY 120 capsule 3  . insulin NPH-regular Human (NOVOLIN 70/30) (70-30) 100 UNIT/ML injection Take 20-30 units 2 x day or as directed 10 mL 99  . latanoprost (XALATAN) 0.005 % ophthalmic solution INT 1 GTT IN EACH EYE QHS  4  .  MAGNESIUM-OXIDE 400 (241.3 Mg) MG tablet TK 1 T PO BID  6  . mycophenolate (MYFORTIC) 360 MG TBEC EC tablet TAKE 1 TABLET BY MOUTH TWICE DAILY 180 tablet 1  . tacrolimus (PROGRAF) 1 MG capsule Take 1 mg by mouth 2 (two) times daily.   2  . TRAVATAN Z 0.004 % SOLN ophthalmic solution Place 1 drop into both eyes at bedtime.     Marland Kitchen warfarin (COUMADIN) 5 MG tablet TAKE 1-2 TABLETS BY MOUTH DAILY AS DIRECTED 180 tablet 1  . atorvastatin (LIPITOR) 80 MG tablet Take 1/2 to 1 tablet daily or as directed for cholesterol 90 tablet 1   No current facility-administered medications on file prior to visit.    Past Medical History:  Diagnosis Date  . Adenomatous colon polyp   . Allergy   . BPH (benign prostatic hyperplasia)   . CHF (congestive heart failure) ([place])   . 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 Diabetes mellitus type 2, controlled ([place])   . Glaucoma   . Gout   . 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 MI (myocardial infarction) 09/26/2011  . OSA (obstructive sleep apnea)   . PAF (paroxysmal atrial fibrillation) ([place])    s/p DC-CV in  6/13. Off coumadin due to ureteral bleed   Allergies  Allergen Reactions  . Crestor [Rosuvastatin]     Elevated LFT's  . Lorazepam   . Losartan     ROS Constitutional: Denies fever, chills, headaches, fatigue. Cardio: Denies chest pain, palpitations, irregular heartbeat, syncope, dyspnea, diaphoresis, orthopnea, PND, claudication, edema Respiratory: chronic SOB denies cough, pleurisy, hoarseness, laryngitis, wheezing.  Gastrointestinal: Denies dysphagia, heartburn, reflux, pain, cramps, nausea, diarrhea, constipation, hematemesis, melena, hematochezia Genitourinary: Denies dysuria, frequency, hematuria, flank pain Musculoskeletal: Denies arthralgia, myalgia, stiffness, Jt. Swelling, pain, limp, strain/sprain. Skin: Denies rash, ecchymosis, petechial. Neuro: Denies Weakness, tremor,  incoordination, spasms, paresthesia, pain Heme/Lymph: Denies Excessive bleeding, bruising, enlarged lymph nodes  Physical: Blood pressure 124/86, pulse 76, temperature 97.5 F (36.4 C), resp. rate 14, height 5\' 8"  (1.727 m), weight 205 lb (93 kg), SpO2 97 %. Filed Weights   07/10/16 0850  Weight: 205 lb (93 kg)    General Appearance: Well nourished, in no apparent distress. ENT/Mouth: Nares clear with no erythema, swelling, mucus on turbinates. No ulcers, cracking, on lips. No erythema, swelling, or exudate on post pharynx.  Neck: Supple, thyroid normal.  Respiratory: CTAB, O2 98% RA   Cardio: NSR with PVCs, holosystolic murmur no, rubs or gallops. 1+ edema. Thrill palpated left arm.  Abdomen: Soft, with bowl sounds. Non tender, no guarding, rebound, hernias, masses, or organomegaly.  Skin: Warm, dry without rashes, lesion. + ecchymosis bilateral arms.  Neuro: Unremarkable   Assessment and plan: Chronic anticoagulation- check INR, getting labs done for nephrology, will get INR there and send them to [place] and will adjust medication according to labs.  Discussed if patient falls to immediately contact office or go to ER. Discussed foods that can increase or decrease Coumadin levels. Patient understands to call the office before starting a new medication.  Afib Continue coumadin, rate controlled, no CP/SOB Follow up in 4-6 weeks  Obesity with co morbidities - long discussion about weight loss, diet, and exercise  CHF, systolic Weight is stable, monitor weight, avoid salt/fluid restrict  DM 1 with CKD Continue to monitor sugars at home May decrease night time insulin  Encounter for insulin use Discussed hypoglycemia, either do protein snack before bed or decrease night time insulin  Hyperlipdemia -continue medications, check lipids, decrease fatty foods, increase activity.   thombocytopenia Monitor CBC, stable at this time    Future Appointments Date Time Provider  [place]  08/16/2016 11:00 AM [name], MD [facility] None  01/31/2017 10:00 AM [name], MD [facility] None

## 2016-07-10 NOTE — Patient Instructions (Signed)
Hypoglycemia Hypoglycemia occurs when the level of sugar (glucose) in the blood is too low. Glucose is a type of sugar that provides the body's main source of energy. Certain hormones (insulin and glucagon) control the level of glucose in the blood. Insulin lowers blood glucose, and glucagon increases blood glucose. Hypoglycemia can result from having too much insulin in the bloodstream, or from not eating enough food that contains glucose. Hypoglycemia can happen in people who do or do not have diabetes. It can develop quickly, and it can be a medical emergency. What are the causes? Hypoglycemia occurs most often in people who have diabetes. If you have diabetes, hypoglycemia may be caused by:  Diabetes medicine.  Not eating enough, or not eating often enough.  Increased physical activity.  Drinking alcohol, especially when you have not eaten recently. If you do not have diabetes, hypoglycemia may be caused by:  A tumor in the pancreas. The pancreas is the organ that makes insulin.  Not eating enough, or not eating for long periods at a time (fasting).  Severe infection or illness that affects the liver, heart, or kidneys.  Certain medicines. You may also have reactive hypoglycemia. This condition causes hypoglycemia within 4 hours of eating a meal. This may occur after having stomach surgery. Sometimes, the cause of reactive hypoglycemia is not known. What increases the risk? Hypoglycemia is more likely to develop in:  People who have diabetes and take medicines to lower blood glucose.  People who abuse alcohol.  People who have a severe illness. What are the signs or symptoms? Hypoglycemia may not cause any symptoms. If you have symptoms, they may include:  Hunger.  Anxiety.  Sweating and feeling clammy.  Confusion.  Dizziness or feeling light-headed.  Sleepiness.  Nausea.  Increased heart rate.  Headache.  Blurry vision.  Seizure.  Nightmares.  Tingling  or numbness around the mouth, lips, or tongue.  A change in speech.  Decreased ability to concentrate.  A change in coordination.  Restless sleep.  Tremors or shakes.  Fainting.  Irritability. How is this diagnosed? Hypoglycemia is diagnosed with a blood test to measure your blood glucose level. This blood test is done while you are having symptoms. Your health care provider may also do a physical exam and review your medical history. If you do not have diabetes, other tests may be done to find the cause of your hypoglycemia. How is this treated? This condition can often be treated by immediately eating or drinking something that contains glucose, such as:  3-4 sugar tablets (glucose pills).  Glucose gel, 15-gram tube.  Fruit juice, 4 oz (120 mL).  Regular soda (not diet soda), 4 oz (120 mL).  Low-fat milk, 4 oz (120 mL).  Several pieces of hard candy.  Sugar or honey, 1 Tbsp. Treating Hypoglycemia If You Have Diabetes  If you are alert and able to swallow safely, follow the 15:15 rule:  Take 15 grams of a rapid-acting carbohydrate. Rapid-acting options include:  1 tube of glucose gel.  3 glucose pills.  6-8 pieces of hard candy.  4 oz (120 mL) of fruit juice.  4 oz (120 ml) of regular (not diet) soda.  Check your blood glucose 15 minutes after you take the carbohydrate.  If the repeat blood glucose level is still at or below 70 mg/dL (3.9 mmol/L), take 15 grams of a carbohydrate again.  If your blood glucose level does not increase above 70 mg/dL (3.9 mmol/L) after 3 tries, seek emergency   emergency medical care.  After your blood glucose level returns to normal, eat a meal or a snack within 1 hour. Treating Severe Hypoglycemia  Severe hypoglycemia is when your blood glucose level is at or below 54 mg/dL (3 mmol/L). Severe hypoglycemia is an emergency. Do not wait to see if the symptoms will go away. Get medical help right away. Call your local emergency services (911  in the [place] Do not drive yourself to the hospital. If you have severe hypoglycemia and you cannot eat or drink, you may need an injection of glucagon. A family member or close friend should learn how to check your blood glucose and how to give you a glucagon injection. Ask your health care provider if you need to have an emergency glucagon injection kit available. Severe hypoglycemia may need to be treated in a hospital. The treatment may include getting glucose through an IV tube. You may also need treatment for the cause of your hypoglycemia. Follow these instructions at home: General instructions   Avoid any diets that cause you to not eat enough food. Talk with your health care provider before you start any new diet.  Take over-the-counter and prescription medicines only as told by your health care provider.  Limit alcohol intake to no more than 1 drink per day for nonpregnant women and 2 drinks per day for men. One drink equals 12 oz of beer, 5 oz of wine, or 1 oz of hard liquor.  Keep all follow-up visits as told by your health care provider. This is important. If You Have Diabetes:    Make sure you know the symptoms of hypoglycemia.  Always have a rapid-acting carbohydrate snack with you to treat low blood sugar.  Follow your diabetes management plan, as told by your health care provider. Make sure you:  Take your medicines as directed.  Follow your exercise plan.  Follow your meal plan. Eat on time, and do not skip meals.  Check your blood glucose as often as directed. Make sure to check your blood glucose before and after exercise. If you exercise longer or in a different way than usual, check your blood glucose more often.  Follow your sick day plan whenever you cannot eat or drink normally. Make this plan in advance with your health care provider.  Share your diabetes management plan with people in your workplace, school, and household.  Check your urine for ketones  when you are ill and as told by your health care provider.  Carry a medical alert card or wear medical alert jewelry. If You Have Reactive Hypoglycemia or Low Blood Sugar From Other Causes:   Monitor your blood glucose as told by your health care provider.  Follow instructions from your health care provider about eating or drinking restrictions. Contact a health care provider if:  You have problems keeping your blood glucose in your target range.  You have frequent episodes of hypoglycemia. Get help right away if:  You continue to have hypoglycemia symptoms after eating or drinking something containing glucose.  Your blood glucose is at or below 54 mg/dL (3 mmol/L).  You have a seizure.  You faint. These symptoms may represent a serious problem that is an emergency. Do not wait to see if the symptoms will go away. Get medical help right away. Call your local emergency services (911 in the [place] Do not drive yourself to the hospital. This information is not intended to replace advice given to you by your health care provider.  Make sure you discuss any questions you have with your health care provider. Document Released: 03/25/2005 Document Revised: 09/06/2015 Document Reviewed: 04/28/2015 Elsevier Interactive Patient Education  2017 [employer].

## 2016-07-14 ENCOUNTER — Other Ambulatory Visit: Payer: Self-pay | Admitting: Internal Medicine

## 2016-07-14 DIAGNOSIS — I482 Chronic atrial fibrillation, unspecified: Secondary | ICD-10-CM

## 2016-08-16 ENCOUNTER — Ambulatory Visit: Payer: Self-pay | Admitting: Internal Medicine

## 2016-09-09 ENCOUNTER — Ambulatory Visit (INDEPENDENT_AMBULATORY_CARE_PROVIDER_SITE_OTHER): Payer: Medicare Other | Admitting: Internal Medicine

## 2016-09-09 ENCOUNTER — Encounter: Payer: Self-pay | Admitting: Internal Medicine

## 2016-09-09 VITALS — BP 126/70 | HR 64 | Temp 97.5°F | Resp 16 | Ht 68.0 in | Wt 198.4 lb

## 2016-09-09 DIAGNOSIS — I480 Paroxysmal atrial fibrillation: Secondary | ICD-10-CM | POA: Diagnosis not present

## 2016-09-09 DIAGNOSIS — E1022 Type 1 diabetes mellitus with diabetic chronic kidney disease: Secondary | ICD-10-CM

## 2016-09-09 DIAGNOSIS — Z79899 Other long term (current) drug therapy: Secondary | ICD-10-CM | POA: Diagnosis not present

## 2016-09-09 DIAGNOSIS — M1000 Idiopathic gout, unspecified site: Secondary | ICD-10-CM

## 2016-09-09 DIAGNOSIS — Z940 Kidney transplant status: Secondary | ICD-10-CM

## 2016-09-09 DIAGNOSIS — I10 Essential (primary) hypertension: Secondary | ICD-10-CM

## 2016-09-09 DIAGNOSIS — N182 Chronic kidney disease, stage 2 (mild): Secondary | ICD-10-CM

## 2016-09-09 DIAGNOSIS — Z7901 Long term (current) use of anticoagulants: Secondary | ICD-10-CM

## 2016-09-09 DIAGNOSIS — E782 Mixed hyperlipidemia: Secondary | ICD-10-CM | POA: Diagnosis not present

## 2016-09-09 DIAGNOSIS — E559 Vitamin D deficiency, unspecified: Secondary | ICD-10-CM

## 2016-09-09 LAB — CBC WITH DIFFERENTIAL/PLATELET
BASOS ABS: 46 {cells}/uL (ref 0–200)
Basophils Relative: 1 %
EOS PCT: 2 %
Eosinophils Absolute: 92 cells/uL (ref 15–500)
HCT: 45 % (ref 38.5–50.0)
HEMOGLOBIN: 14.9 g/dL (ref 13.2–17.1)
Lymphocytes Relative: 22 %
Lymphs Abs: 1012 cells/uL (ref 850–3900)
MCH: 29 pg (ref 27.0–33.0)
MCHC: 33.1 g/dL (ref 32.0–36.0)
MCV: 87.7 fL (ref 80.0–100.0)
MPV: 9.1 fL (ref 7.5–12.5)
Monocytes Absolute: 368 cells/uL (ref 200–950)
Monocytes Relative: 8 %
Neutro Abs: 3082 cells/uL (ref 1500–7800)
Neutrophils Relative %: 67 %
Platelets: 141 10*3/uL (ref 140–400)
RBC: 5.13 MIL/uL (ref 4.20–5.80)
RDW: 12.9 % (ref 11.0–15.0)
WBC: 4.6 10*3/uL (ref 3.8–10.8)

## 2016-09-09 LAB — TSH: TSH: 1.36 mIU/L (ref 0.40–4.50)

## 2016-09-09 NOTE — Patient Instructions (Signed)

## 2016-09-09 NOTE — Progress Notes (Signed)
This very nice 71 y.o.  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      Patient is treated for HTN (1998)  & BP has been controlled at home. Today's BP is at 126/70.  Post Op Renal Transplant , he underwent PCA/Stent and in Nov 2013 he underwent CABG and has done well since. He remains on Coumadin since. Patient has had no complaints of any cardiac type chest pain, palpitations, dyspnea/orthopnea/PND, dizziness, claudication, or dependent edema.     Hyperlipidemia is controlled with diet & meds. Patient denies myalgias or other med SE's. 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  Last A1c was not at goal: Lab Results  Component Value Date   HGBA1C 6.9 (H) 05/08/2016      Further, the patient also has history of Vitamin D Deficiency ("24" in 2014)  and supplements vitamin D without any suspected side-effects. Last vitamin D was at goal:  Lab Results  Component Value Date   VD25OH 99 05/08/2016   Current Outpatient Prescriptions on File Prior to Visit  Medication Sig  . aspirin EC 81 MG tablet Take 81 mg by mouth daily.  . betamethasone dipropionate (DIPROLENE) 0.05 % cream Apply topically.  . Black Pepper-Turmeric (TURMERIC COMPLEX/BLACK PEPPER PO) Take 1,500 mg by mouth daily.  Marland Kitchen buPROPion (WELLBUTRIN XL) 300 MG 24 hr tablet TAKE ONE TABLET BY MOUTH EVERY MORNING  . Cholecalciferol (VITAMIN D PO) Take 1,000 Int'l Units by mouth daily.   . finasteride (PROSCAR) 5 MG tablet TAKE 1 TABLET BY MOUTH  DAILY  . glucose blood (FREESTYLE TEST STRIPS) test strip Check blood sugar 3 to 4 times daily for medication regulation.  . hydrOXYzine (VISTARIL) 25 MG capsule TAKE 1 TO 2 CAPSULES BY MOUTH 2 TO 4 TIMES DAILY AS NEEDED FOR ANXIETY  . insulin NPH-regular Human (NOVOLIN 70/30) (70-30) 100 UNIT/ML injection Take 20-30 units 2 x day or as directed  . latanoprost (XALATAN) 0.005 % ophthalmic solution INT 1 GTT IN EACH EYE QHS  . MAGNESIUM-OXIDE 400 (241.3 Mg) MG tablet TK 1 T PO BID  . mycophenolate (MYFORTIC) 360 MG TBEC EC tablet TAKE 1 TABLET BY MOUTH TWICE DAILY  . tacrolimus (PROGRAF) 1 MG capsule Take 1 mg by mouth 2 (two) times daily.   . TRAVATAN Z 0.004 % SOLN ophthalmic solution Place 1 drop into both eyes at bedtime.   Marland Kitchen warfarin (COUMADIN) 5 MG tablet TAKE 1 TO 2 TABLETS BY MOUTH DAILY AS DIRECTED  . atorvastatin (LIPITOR) 80 MG tablet Take 1/2 to 1 tablet daily or as directed for cholesterol   No current facility-administered medications on file prior to visit.    Allergies  Allergen Reactions  . Crestor [Rosuvastatin]     Elevated LFT's  . Lorazepam   . Losartan    PMHx:   Past Medical History:  Diagnosis Date  . Adenomatous colon polyp   . Allergy   . BPH (benign prostatic hyperplasia)   . CHF (congestive  heart failure) ([place])   . 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 Diabetes mellitus type 2, controlled ([place])   . Glaucoma   . Gout   . 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 MI (myocardial infarction) ([place]) 09/26/2011  . OSA (obstructive sleep apnea)   . PAF (paroxysmal atrial fibrillation) ([place])    s/p DC-CV in 6/13. Off coumadin due to ureteral bleed   Immunization History  Administered Date(s) Administered  . DT 06/05/2015  . DTaP 04/08/2008  . Hepatitis B 04/08/2009  . Influenza, High Dose Seasonal PF 12/27/2013, 01/17/2015, 01/11/2016  . Pneumococcal Conjugate-13 10/25/2013  . Pneumococcal  Polysaccharide-23 06/05/2015  . Pneumococcal-Unspecified 04/08/2008   Past Surgical History:  Procedure Laterality Date  . AV FISTULA PLACEMENT  2011   Left forearm  . COLONOSCOPY    . CORONARY ANGIOPLASTY WITH STENT PLACEMENT    . CORONARY ARTERY BYPASS GRAFT  2013  . KIDNEY TRANSPLANT  09/25/2011  . PERITONEAL CATHETER INSERTION  2011   FHx:    Reviewed / unchanged  SHx:    Reviewed / unchanged  Systems Review:  Constitutional: Denies fever, chills, wt changes, headaches, insomnia, fatigue, night sweats, change in appetite. Eyes: Denies redness, blurred vision, diplopia, discharge, itchy, watery eyes.  ENT: Denies discharge, congestion, post nasal drip, epistaxis, sore throat, earache, hearing loss, dental pain, tinnitus, vertigo, sinus pain, snoring.  CV: Denies chest pain, palpitations, irregular heartbeat, syncope, dyspnea, diaphoresis, orthopnea, PND, claudication or edema. Respiratory: denies cough, dyspnea, DOE, pleurisy, hoarseness, laryngitis, wheezing.  Gastrointestinal: Denies dysphagia, odynophagia, heartburn, reflux, water brash, abdominal pain or cramps, nausea, vomiting, bloating, diarrhea, constipation, hematemesis, melena, hematochezia  or hemorrhoids. Genitourinary: Denies dysuria, frequency, urgency, nocturia, hesitancy, discharge, hematuria or flank pain. Musculoskeletal: Denies arthralgias, myalgias, stiffness, jt. swelling, pain, limping or strain/sprain.  Skin: Denies pruritus, rash, hives, warts, acne, eczema or change in skin lesion(s). Neuro: No weakness, tremor, incoordination, spasms, paresthesia or pain. Psychiatric: Denies confusion, memory loss or sensory loss. Endo: Denies change in weight, skin or hair change.  Heme/Lymph: No excessive bleeding, bruising or enlarged lymph nodes.  Physical Exam  BP 126/70   Pulse 64   Temp 97.5 F (36.4 C)   Resp 16   Ht 5\' 8"  (1.727 m)   Wt 198 lb 6.4 oz (90 kg)   BMI 30.17 kg/m   Appears well nourished,  well groomed  and in no distress.  Eyes: PERRLA, EOMs, conjunctiva no swelling or erythema. Sinuses: No frontal/maxillary tenderness ENT/Mouth: EAC's clear, TM's nl w/o erythema, bulging. Nares clear w/o erythema, swelling, exudates. Oropharynx clear without erythema or exudates. Oral hygiene is good. Tongue normal, non obstructing. Hearing intact.  Neck: Supple. Thyroid nl. Car 2+/2+ without bruits, nodes or JVD. Chest: Respirations nl with BS clear & equal w/o rales, rhonchi, wheezing or stridor.  Cor: Heart sounds soft w/ regular rate and rhythm with a gr 2/4 AoSys m transmitted to the R carotid. Peripheral pulses normal and equal  without edema.  Abdomen: Soft & bowel sounds normal. Non-tender w/o guarding, rebound, hernias, masses or organomegaly.  Lymphatics: Unremarkable.  Musculoskeletal: Full ROM all peripheral extremities, joint stability, 5/5 strength and normal gait.  Skin: Warm, dry without exposed rashes, lesions or ecchymosis apparent.  Neuro: Cranial nerves intact, reflexes equal bilaterally. Sensory-motor testing grossly intact. Tendon reflexes grossly intact.  Pysch: Alert & oriented x 3.  Insight and judgement nl & appropriate. No ideations.  Assessment  and Plan:  1. Essential hypertension  - Continue medication, monitor blood pressure at home.  - Continue DASH diet. Reminder to go to the ER if any CP,  SOB, nausea, dizziness, severe HA, changes vision/speech,  left arm numbness and tingling and jaw pain.  - CBC with Differential/Platelet - BASIC METABOLIC PANEL WITH GFR - Magnesium - TSH  2. Hyperlipidemia, mixed  - Continue diet/meds, exercise,& lifestyle modifications.  - Continue monitor periodic cholesterol/liver & renal functions   - Hepatic function panel - Lipid panel - TSH  3. 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  - Monitor appropriate labs.  - Hemoglobin A1c  4. Vitamin D  deficiency  - Continue supplementation.  - VITAMIN D 25 Hydroxy   5. Renal transplant, status post (09/2011)   6. Idiopathic gout  - Uric acid  7. PAF (paroxysmal atrial fibrillation) ([facility])  - Protime-INR  8. Long term current use of anticoagulant therapy  - Protime-INR  9. Medication management  - CBC with Differential/Platelet - BASIC METABOLIC PANEL WITH GFR - Hepatic function panel - Magnesium - Lipid panel - TSH - Hemoglobin A1c - Insulin, random - VITAMIN D 25 Hydroxy        Discussed  regular exercise, BP monitoring, weight control to achieve/maintain BMI less than 25 and discussed med and SE's. Recommended labs to assess and monitor clinical status with further disposition pending results of labs. Over 30 minutes of exam, counseling, chart review was performed.

## 2016-09-10 LAB — BASIC METABOLIC PANEL WITH GFR
BUN: 16 mg/dL (ref 7–25)
CALCIUM: 9.6 mg/dL (ref 8.6–10.3)
CO2: 24 mmol/L (ref 20–31)
Chloride: 105 mmol/L (ref 98–110)
Creat: 1.1 mg/dL (ref 0.70–1.25)
GFR, EST AFRICAN AMERICAN: 79 mL/min (ref >=60)
GFR, Est Non African American: 69 mL/min (ref >=60)
GLUCOSE: 116 mg/dL — AB (ref 65–99)
Potassium: 4.8 mmol/L (ref 3.5–5.3)
Sodium: 141 mmol/L (ref 135–146)

## 2016-09-10 LAB — VITAMIN D 25 HYDROXY (VIT D DEFICIENCY, FRACTURES): Vit D, 25-Hydroxy: 97 ng/mL (ref 30–100)

## 2016-09-10 LAB — LIPID PANEL
CHOLESTEROL: 157 mg/dL (ref <200)
HDL: 65 mg/dL (ref >40)
LDL CALC: 75 mg/dL (ref <100)
TRIGLYCERIDES: 84 mg/dL (ref <150)
Total CHOL/HDL Ratio: 2.4 Ratio (ref <5.0)
VLDL: 17 mg/dL (ref <30)

## 2016-09-10 LAB — PROTIME-INR
INR: 2.1 — ABNORMAL HIGH
PROTHROMBIN TIME: 21.8 s — AB (ref 9.0–11.5)

## 2016-09-10 LAB — HEPATIC FUNCTION PANEL
ALK PHOS: 95 U/L (ref 40–115)
ALT: 16 U/L (ref 9–46)
AST: 20 U/L (ref 10–35)
Albumin: 4.1 g/dL (ref 3.6–5.1)
Bilirubin, Direct: 0.3 mg/dL — ABNORMAL HIGH (ref <=0.2)
Indirect Bilirubin: 1.1 mg/dL (ref 0.2–1.2)
TOTAL PROTEIN: 6.9 g/dL (ref 6.1–8.1)
Total Bilirubin: 1.4 mg/dL — ABNORMAL HIGH (ref 0.2–1.2)

## 2016-09-10 LAB — HEMOGLOBIN A1C
Hgb A1c MFr Bld: 6.3 % — ABNORMAL HIGH (ref <5.7)
MEAN PLASMA GLUCOSE: 134 mg/dL

## 2016-09-10 LAB — URIC ACID: Uric Acid, Serum: 7.2 mg/dL (ref 4.0–8.0)

## 2016-09-10 LAB — MAGNESIUM: Magnesium: 1.8 mg/dL (ref 1.5–2.5)

## 2016-09-10 LAB — INSULIN, RANDOM: INSULIN: 15.4 u[IU]/mL (ref 2.0–19.6)

## 2016-09-15 ENCOUNTER — Other Ambulatory Visit: Payer: Self-pay | Admitting: Internal Medicine

## 2016-09-23 ENCOUNTER — Encounter: Payer: Self-pay | Admitting: Internal Medicine

## 2016-09-23 ENCOUNTER — Other Ambulatory Visit: Payer: Self-pay | Admitting: Internal Medicine

## 2016-09-23 MED ORDER — AZITHROMYCIN 250 MG PO TABS: ORAL_TABLET | ORAL | 0 refills | Status: AC

## 2016-09-27 ENCOUNTER — Encounter: Payer: Self-pay | Admitting: Internal Medicine

## 2016-09-27 ENCOUNTER — Other Ambulatory Visit: Payer: Self-pay | Admitting: Internal Medicine

## 2016-10-07 ENCOUNTER — Other Ambulatory Visit: Payer: Self-pay | Admitting: Internal Medicine

## 2016-10-11 ENCOUNTER — Ambulatory Visit (INDEPENDENT_AMBULATORY_CARE_PROVIDER_SITE_OTHER): Payer: Medicare Other

## 2016-10-11 ENCOUNTER — Ambulatory Visit (HOSPITAL_COMMUNITY)
Admission: RE | Admit: 2016-10-11 | Discharge: 2016-10-11 | Disposition: A | Discharge status: Home / Self Care | Payer: Medicare Other | Source: Ambulatory Visit | Attending: Internal Medicine | Admitting: Internal Medicine

## 2016-10-11 ENCOUNTER — Other Ambulatory Visit: Payer: Self-pay

## 2016-10-11 ENCOUNTER — Other Ambulatory Visit: Payer: Self-pay | Admitting: Internal Medicine

## 2016-10-11 ENCOUNTER — Encounter: Payer: Self-pay | Admitting: Internal Medicine

## 2016-10-11 VITALS — Ht 68.0 in | Wt 203.0 lb

## 2016-10-11 DIAGNOSIS — Z79899 Other long term (current) drug therapy: Secondary | ICD-10-CM | POA: Diagnosis not present

## 2016-10-11 DIAGNOSIS — I2510 Atherosclerotic heart disease of native coronary artery without angina pectoris: Secondary | ICD-10-CM

## 2016-10-11 DIAGNOSIS — Z940 Kidney transplant status: Secondary | ICD-10-CM | POA: Diagnosis not present

## 2016-10-11 DIAGNOSIS — N2889 Other specified disorders of kidney and ureter: Secondary | ICD-10-CM

## 2016-10-11 DIAGNOSIS — R0600 Dyspnea, unspecified: Secondary | ICD-10-CM

## 2016-10-11 DIAGNOSIS — I151 Hypertension secondary to other renal disorders: Secondary | ICD-10-CM

## 2016-10-11 DIAGNOSIS — J8410 Pulmonary fibrosis, unspecified: Secondary | ICD-10-CM | POA: Insufficient documentation

## 2016-10-11 DIAGNOSIS — Z951 Presence of aortocoronary bypass graft: Secondary | ICD-10-CM | POA: Insufficient documentation

## 2016-10-11 LAB — CBC WITH DIFFERENTIAL/PLATELET
BASOS ABS: 42 {cells}/uL (ref 0–200)
Basophils Relative: 1 %
EOS ABS: 84 {cells}/uL (ref 15–500)
Eosinophils Relative: 2 %
HEMATOCRIT: 42.4 % (ref 38.5–50.0)
HEMOGLOBIN: 14.1 g/dL (ref 13.2–17.1)
LYMPHS ABS: 1176 {cells}/uL (ref 850–3900)
LYMPHS PCT: 28 %
MCH: 29.3 pg (ref 27.0–33.0)
MCHC: 33.3 g/dL (ref 32.0–36.0)
MCV: 88.1 fL (ref 80.0–100.0)
MONO ABS: 336 {cells}/uL (ref 200–950)
MPV: 8.9 fL (ref 7.5–12.5)
Monocytes Relative: 8 %
NEUTROS PCT: 61 %
Neutro Abs: 2562 cells/uL (ref 1500–7800)
Platelets: 139 10*3/uL — ABNORMAL LOW (ref 140–400)
RBC: 4.81 MIL/uL (ref 4.20–5.80)
RDW: 13.6 % (ref 11.0–15.0)
WBC: 4.2 10*3/uL (ref 3.8–10.8)

## 2016-10-11 LAB — BRAIN NATRIURETIC PEPTIDE: BRAIN NATRIURETIC PEPTIDE: 171.4 pg/mL — AB (ref <100)

## 2016-10-11 NOTE — Progress Notes (Signed)
Pt presents for BNP & CBC w/DIFF per [facility]. Lab were already put into epic.

## 2016-10-11 NOTE — Addendum Note (Signed)
Addended by: Elsie Amis D on: 10/11/2016 11:00 AM   Modules accepted: Orders

## 2016-10-13 ENCOUNTER — Other Ambulatory Visit: Payer: Self-pay | Admitting: Physician Assistant

## 2016-10-16 ENCOUNTER — Encounter: Payer: Self-pay | Admitting: Physician Assistant

## 2016-10-16 MED ORDER — DICYCLOMINE HCL 10 MG PO CAPS: 10.0000 mg | ORAL_CAPSULE | Freq: Three times a day (TID) | ORAL | 0 refills | Status: DC | PRN

## 2016-10-16 MED ORDER — PROMETHAZINE HCL 25 MG PO TABS: 25.0000 mg | ORAL_TABLET | Freq: Four times a day (QID) | ORAL | 0 refills | Status: DC | PRN

## 2016-10-16 NOTE — Progress Notes (Signed)
Coumadin follow up  Patient is on Coumadin for Persistent atrial fibrillation (HCC) [I48.1]   He has had nausea, diarrhea and vomiting x 3 days, he has been in bed the entire time, [name]/wife is sick with bronchitis, no recent ABX use, no recent hospitalization,  No fever but has had chills. Is having diarrhea 4-5 x a day, large volume watery yellow diarrhea, AB pain, nausea with bilious vomiting x 3 times. Given promethazine and dicyclomine helped some. Started on vitamin C 1000 IU a day. Normal colonoscopy 03/2016, EGD 2012, CT AB 2012, CXr 10/2016. Has new restless legs at night.   Has been waking up in the middle of night shortness of breath x 4 weeks, had sleep study 2013 (has lost weight since that time and it was mild OSA), normal CXR and normal BNP.  Patient's last INR is  Lab Results  Component Value Date   INR 2.1 (H) 09/09/2016   INR 2.6 (H) 07/10/2016   INR 2.2 (H) 06/06/2016    Patient denies SOB, CP, dizziness, nose bleeds, easy bleeding, and blood in stool/urine. He is on 7.5 M,W,F and 5mg  the rest.  His coumadin dose was changed last visit. He has not taken ABX, has not missed any doses and denies a fall.   BMI is Body mass index is 29.5 kg/m., he is working on diet and exercise, he has SOB after lying down for 5 mins, and will happen 2-3 times in the middle of the night, he will sit up and gasp for breath, weight is down. He has DM with CKD, states sugars have been controlled at home, sugars have been in 200's for last few days.  Wt Readings from Last 3 Encounters:  10/17/16 194 lb (88 kg)  10/11/16 203 lb (92.1 kg)  09/09/16 198 lb 6.4 oz (90 kg)   Blood pressure 100/70, pulse 66, temperature (!) 97.5 F (36.4 C), resp. rate 14, height 5\' 8"  (1.727 m), weight 194 lb (88 kg), SpO2 99 %.     Current Outpatient Prescriptions on File Prior to Visit  Medication Sig Dispense Refill  . aspirin EC 81 MG tablet Take 81 mg by mouth daily.    . betamethasone dipropionate  (DIPROLENE) 0.05 % cream Apply topically.    . Black Pepper-Turmeric (TURMERIC COMPLEX/BLACK PEPPER PO) Take 1,500 mg by mouth daily.    Marland Kitchen buPROPion (WELLBUTRIN XL) 300 MG 24 hr tablet TAKE ONE TABLET BY MOUTH EVERY MORNING 90 tablet 1  . Cholecalciferol (VITAMIN D PO) Take 1,000 Int'l Units by mouth daily.     Marland Kitchen dicyclomine (BENTYL) 10 MG capsule Take 1 capsule (10 mg total) by mouth 3 (three) times daily with meals as needed for spasms (diarrhea). 30 capsule 0  . finasteride (PROSCAR) 5 MG tablet TAKE 1 TABLET BY MOUTH DAILY 90 tablet 1  . glucose blood (FREESTYLE TEST STRIPS) test strip Check blood sugar 3 to 4 times daily for medication regulation. 450 each PRN  . hydrOXYzine (VISTARIL) 25 MG capsule TAKE 1 TO 2 CAPSULES BY MOUTH 2 TO 4 TIMES DAILY AS NEEDED FOR ANXIETY 120 capsule 3  . insulin NPH-regular Human (NOVOLIN 70/30) (70-30) 100 UNIT/ML injection Take 20-30 units 2 x day or as directed 10 mL 99  . latanoprost (XALATAN) 0.005 % ophthalmic solution INT 1 GTT IN EACH EYE QHS  4  . MAGNESIUM-OXIDE 400 (241.3 Mg) MG tablet TK 1 T PO BID  6  . mycophenolate (MYFORTIC) 360 MG TBEC EC tablet TAKE 1  TABLET BY MOUTH TWICE DAILY 180 tablet 1  . promethazine (PHENERGAN) 25 MG tablet Take 1 tablet (25 mg total) by mouth every 6 (six) hours as needed for nausea or vomiting. Max: 4 tablets per day 30 tablet 0  . tacrolimus (PROGRAF) 0.5 MG capsule TK 1 C PO BID  8  . tacrolimus (PROGRAF) 1 MG capsule Take 1 mg by mouth 2 (two) times daily.   2  . TRAVATAN Z 0.004 % SOLN ophthalmic solution Place 1 drop into both eyes at bedtime.     Marland Kitchen warfarin (COUMADIN) 5 MG tablet TAKE 1 TO 2 TABLETS BY MOUTH DAILY AS DIRECTED 180 tablet 1  . atorvastatin (LIPITOR) 80 MG tablet Take 1/2 to 1 tablet daily or as directed for cholesterol 90 tablet 1   No current facility-administered medications on file prior to visit.    Past Medical History:  Diagnosis Date  . Adenomatous colon polyp   . Allergy   . BPH  (benign prostatic hyperplasia)   . CHF (congestive heart failure) ([place])   . 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 Diabetes mellitus type 2, controlled ([place])   . Glaucoma   . Gout   . 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 MI (myocardial infarction) ([place]) 09/26/2011  . OSA (obstructive sleep apnea)   . PAF (paroxysmal atrial fibrillation) ([place])    s/p DC-CV in 6/13. Off coumadin due to ureteral bleed   Allergies  Allergen Reactions  . Crestor [Rosuvastatin]     Elevated LFT's  . Lorazepam   . Losartan     ROS Review of Systems  Constitutional: Positive for chills and malaise/fatigue. Negative for diaphoresis, fever and weight loss.  HENT: Negative.   Eyes: Negative.   Respiratory: Positive for shortness of breath. Negative for cough, hemoptysis, sputum production and wheezing.   Cardiovascular: Positive for PND. Negative for chest pain, palpitations, orthopnea, claudication and leg swelling.  Gastrointestinal: Positive for abdominal pain, diarrhea, heartburn, nausea and vomiting. Negative for blood in stool, constipation and melena.  Genitourinary: Negative.  Negative for dysuria.  Musculoskeletal: Positive for myalgias (at night in legs). Negative for back pain, falls, joint pain and neck pain.  Skin: Negative.  Negative for itching and rash.  Neurological: Positive for dizziness. Negative for tingling, tremors, sensory change, speech change, focal weakness, seizures, loss of consciousness, weakness and headaches.  Endo/Heme/Allergies: Negative.   Psychiatric/Behavioral: Negative.      Physical: Blood pressure 100/70, pulse 66, temperature (!) 97.5 F (36.4 C), resp. rate 14, height 5\' 8"  (1.727 m), weight 194 lb (88 kg), SpO2 99 %. Filed Weights   10/17/16 1120  Weight: 194 lb (88 kg)    General Appearance: Well nourished, in no apparent distress. ENT/Mouth: Nares clear with no erythema, swelling, mucus on  turbinates. No ulcers, cracking, on lips, tongue dry. No erythema, swelling, or exudate on post pharynx.  Neck: Supple, thyroid normal.  Respiratory: CTAB, O2 98% RA   Cardio: NSR with PVCs, holosystolic murmur no, rubs or gallops. 1+ edema. Thrill palpated left arm.  Abdomen: Soft, with bowl sounds. diffuse tenderness without guarding or rebound, no hernias, masses, or organomegaly.  Skin: Warm, dry without rashes, lesion. + ecchymosis bilateral arms.  Neuro: Unremarkable   Assessment and plan: Chronic anticoagulation- check INR, getting labs done for nephrology, will get INR there and send them to [place] and will adjust medication according to labs.  Discussed if  patient falls to immediately contact office or go to ER. Discussed foods that can increase or decrease Coumadin levels. Patient understands to call the office before starting a new medication.  Afib Continue coumadin, rate controlled Follow up in 4-6 weeks  Obesity with co morbidities - long discussion about weight loss, diet, and exercise  CHF, systolic Weight is stable, monitor weight, avoid salt/fluid restrict  DM 1 with CKD Continue to monitor sugars at home  Encounter for insulin use Discussed hypoglycemia, either do protein snack before bed or decrease night time insulin  thombocytopenia Monitor CBC, stable at this time  Nausea and vomiting, intractability of vomiting not specified, unspecified vomiting type and diarrhea Immune compromised Appears dry but vitals stable, will check labs but instructed patient to go to ER if any worsening symptom, may need fluids Benign AB, may need referral GI for EGD -     pantoprazole (PROTONIX) 40 MG tablet; Take 1 tablet (40 mg total) by mouth daily. -     ondansetron (ZOFRAN) 8 MG tablet; 1/2-1 tablet q 8 hours as needed for nausea and vomiting -     CBC with Differential/Platelet -     BASIC METABOLIC PANEL WITH GFR -     Hepatic function panel -     TSH -     Magnesium -      Amylase -     sucralfate (CARAFATE) 1 g tablet; Take 1 tablet (1 g total) by mouth 2 (two) times daily as needed. -     Gastrointestinal Pathogen Panel PCR; Future  Dyspnea, unspecified type -     EKG 12-Lead - weight down/stable, recent normal EKG, CBC, BNP - ? From dehydration, check labs, if worsening symptoms go to ER   Future Appointments Date Time Provider [place]  11/18/2016 10:45 AM Vicie Mutters, PA-C [facility] None  12/19/2016 9:45 AM [name], MD [facility] None  01/31/2017 10:00 AM [name], MD [facility] None

## 2016-10-17 ENCOUNTER — Encounter: Payer: Self-pay | Admitting: Physician Assistant

## 2016-10-17 ENCOUNTER — Other Ambulatory Visit: Payer: Self-pay | Admitting: Physician Assistant

## 2016-10-17 ENCOUNTER — Ambulatory Visit (INDEPENDENT_AMBULATORY_CARE_PROVIDER_SITE_OTHER): Payer: Medicare Other | Admitting: Physician Assistant

## 2016-10-17 VITALS — BP 100/70 | HR 66 | Temp 97.5°F | Resp 14 | Ht 68.0 in | Wt 194.0 lb

## 2016-10-17 DIAGNOSIS — E1022 Type 1 diabetes mellitus with diabetic chronic kidney disease: Secondary | ICD-10-CM | POA: Diagnosis not present

## 2016-10-17 DIAGNOSIS — D696 Thrombocytopenia, unspecified: Secondary | ICD-10-CM | POA: Diagnosis not present

## 2016-10-17 DIAGNOSIS — R112 Nausea with vomiting, unspecified: Secondary | ICD-10-CM

## 2016-10-17 DIAGNOSIS — N182 Chronic kidney disease, stage 2 (mild): Secondary | ICD-10-CM

## 2016-10-17 DIAGNOSIS — I481 Persistent atrial fibrillation: Secondary | ICD-10-CM | POA: Diagnosis not present

## 2016-10-17 DIAGNOSIS — R0600 Dyspnea, unspecified: Secondary | ICD-10-CM

## 2016-10-17 DIAGNOSIS — F325 Major depressive disorder, single episode, in full remission: Secondary | ICD-10-CM | POA: Diagnosis not present

## 2016-10-17 DIAGNOSIS — I4819 Other persistent atrial fibrillation: Secondary | ICD-10-CM

## 2016-10-17 DIAGNOSIS — Z794 Long term (current) use of insulin: Secondary | ICD-10-CM

## 2016-10-17 DIAGNOSIS — R197 Diarrhea, unspecified: Secondary | ICD-10-CM

## 2016-10-17 DIAGNOSIS — I5022 Chronic systolic (congestive) heart failure: Secondary | ICD-10-CM

## 2016-10-17 LAB — HEPATIC FUNCTION PANEL
ALBUMIN: 4.1 g/dL (ref 3.6–5.1)
ALK PHOS: 86 U/L (ref 40–115)
ALT: 19 U/L (ref 9–46)
AST: 26 U/L (ref 10–35)
BILIRUBIN TOTAL: 1.1 mg/dL (ref 0.2–1.2)
Bilirubin, Direct: 0.2 mg/dL (ref <=0.2)
Indirect Bilirubin: 0.9 mg/dL (ref 0.2–1.2)
Total Protein: 6.8 g/dL (ref 6.1–8.1)

## 2016-10-17 LAB — BASIC METABOLIC PANEL WITH GFR
BUN: 25 mg/dL (ref 7–25)
CALCIUM: 9.8 mg/dL (ref 8.6–10.3)
CO2: 25 mmol/L (ref 20–31)
Chloride: 100 mmol/L (ref 98–110)
Creat: 1.12 mg/dL (ref 0.70–1.25)
GFR, EST AFRICAN AMERICAN: 78 mL/min (ref >=60)
GFR, EST NON AFRICAN AMERICAN: 67 mL/min (ref >=60)
Glucose, Bld: 130 mg/dL — ABNORMAL HIGH (ref 65–99)
POTASSIUM: 4.2 mmol/L (ref 3.5–5.3)
Sodium: 136 mmol/L (ref 135–146)

## 2016-10-17 LAB — AMYLASE: AMYLASE: 57 U/L (ref 21–101)

## 2016-10-17 LAB — CBC WITH DIFFERENTIAL/PLATELET
BASOS PCT: 1 %
Basophils Absolute: 36 cells/uL (ref 0–200)
Eosinophils Absolute: 36 cells/uL (ref 15–500)
Eosinophils Relative: 1 %
HCT: 44 % (ref 38.5–50.0)
Hemoglobin: 15.1 g/dL (ref 13.2–17.1)
LYMPHS PCT: 26 %
Lymphs Abs: 936 cells/uL (ref 850–3900)
MCH: 29.9 pg (ref 27.0–33.0)
MCHC: 34.3 g/dL (ref 32.0–36.0)
MCV: 87.1 fL (ref 80.0–100.0)
MONOS PCT: 15 %
MPV: 9.2 fL (ref 7.5–12.5)
Monocytes Absolute: 540 cells/uL (ref 200–950)
NEUTROS PCT: 57 %
Neutro Abs: 2052 cells/uL (ref 1500–7800)
PLATELETS: 143 10*3/uL (ref 140–400)
RBC: 5.05 MIL/uL (ref 4.20–5.80)
RDW: 13.4 % (ref 11.0–15.0)
WBC: 3.6 10*3/uL — AB (ref 3.8–10.8)

## 2016-10-17 LAB — TSH: TSH: 1.43 mIU/L (ref 0.40–4.50)

## 2016-10-17 LAB — MAGNESIUM: MAGNESIUM: 1.6 mg/dL (ref 1.5–2.5)

## 2016-10-17 MED ORDER — PANTOPRAZOLE SODIUM 40 MG PO TBEC: 40.0000 mg | DELAYED_RELEASE_TABLET | Freq: Every day | ORAL | 0 refills | Status: DC

## 2016-10-17 MED ORDER — ONDANSETRON HCL 8 MG PO TABS: ORAL_TABLET | ORAL | 1 refills | Status: DC

## 2016-10-17 MED ORDER — SUCRALFATE 1 G PO TABS: 1.0000 g | ORAL_TABLET | Freq: Two times a day (BID) | ORAL | 0 refills | Status: DC | PRN

## 2016-10-17 NOTE — Patient Instructions (Signed)
Nausea and Vomiting, Adult Nausea is the feeling that you have an upset stomach or have to vomit. As nausea gets worse, it can lead to vomiting. Vomiting occurs when stomach contents are thrown up and out of the mouth. Vomiting can make you feel weak and cause you to become dehydrated. Dehydration can make you tired and thirsty, cause you to have a dry mouth, and decrease how often you urinate. Older adults and people with other diseases or a weak immune system are at higher risk for dehydration. It is important to treat your nausea and vomiting as told by your health care provider. Follow these instructions at home: Follow instructions from your health care provider about how to care for yourself at home. Eating and drinking Follow these recommendations as told by your health care provider:  Take an oral rehydration solution (ORS). This is a drink that is sold at pharmacies and retail stores.  Drink clear fluids in small amounts as you are able. Clear fluids include water, ice chips, diluted fruit juice, and low-calorie sports drinks.  Eat bland, easy-to-digest foods in small amounts as you are able. These foods include bananas, applesauce, rice, lean meats, toast, and crackers.  Avoid fluids that contain a lot of sugar or caffeine, such as energy drinks, sports drinks, and soda.  Avoid alcohol.  Avoid spicy or fatty foods.  General instructions  Drink enough fluid to keep your urine clear or pale yellow.  Wash your hands often. If soap and water are not available, use hand sanitizer.  Make sure that all people in your household wash their hands well and often.  Take over-the-counter and prescription medicines only as told by your health care provider.  Rest at home while you recover.  Watch your condition for any changes.  Breathe slowly and deeply when you feel nauseated.  Keep all follow-up visits as told by your health care provider. This is important. Contact a health care  provider if:  You have a fever.  You cannot keep fluids down.  Your symptoms get worse.  You have new symptoms.  Your nausea does not go away after two days.  You feel light-headed or dizzy.  You have a headache.  You have muscle cramps. Get help right away if:  You have pain in your chest, neck, arm, or jaw.  You feel extremely weak or you faint.  You have persistent vomiting.  You see blood in your vomit.  Your vomit looks like black coffee grounds.  You have bloody or black stools or stools that look like tar.  You have a severe headache, a stiff neck, or both.  You have a rash.  You have severe pain, cramping, or bloating in your abdomen.  You have trouble breathing or you are breathing very quickly.  Your heart is beating very quickly.  Your skin feels cold and clammy.  You feel confused.  You have pain when you urinate.  You have signs of dehydration, such as: ? Dark urine, very little urine, or no urine. ? Cracked lips. ? Dry mouth. ? Sunken eyes. ? Sleepiness. ? Weakness. These symptoms may represent a serious problem that is an emergency. Do not wait to see if the symptoms will go away. Get medical help right away. Call your local emergency services (911 in the [place] Do not drive yourself to the hospital. This information is not intended to replace advice given to you by your health care provider. Make sure you discuss any questions you   have with your health care provider. Document Released: 03/25/2005 Document Revised: 08/28/2015 Document Reviewed: 11/29/2014 Elsevier Interactive Patient Education  2017 [place] of Breath, Adult Shortness of breath is when a person has trouble breathing enough air, or when a person feels like she or he is having trouble breathing in enough air. Shortness of breath could be a sign of medical problem. Follow these instructions at home: Pay attention to any changes in your symptoms. Take these  actions to help with your condition:  Do not smoke. Smoking is a common cause of shortness of breath. If you smoke and you need help quitting, ask your health care provider.  Avoid things that can irritate your airways, such as: ? Mold. ? Dust. ? Air pollution. ? Chemical fumes. ? Things that can cause allergy symptoms (allergens), if you have allergies.  Keep your living space clean and free of mold and dust.  Rest as needed. Slowly return to your usual activities.  Take over-the-counter and prescription medicines, including oxygen and inhaled medicines, only as told by your health care provider.  Keep all follow-up visits as told by your health care provider. This is important.  Contact a health care provider if:  Your condition does not improve as soon as expected.  You have a hard time doing your normal activities, even after you rest.  You have new symptoms. Get help right away if:  Your shortness of breath gets worse.  You have shortness of breath when you are resting.  You feel light-headed or you faint.  You have a cough that is not controlled with medicines.  You cough up blood.  You have pain with breathing.  You have pain in your chest, arms, shoulders, or abdomen.  You have a fever.  You cannot walk up stairs or exercise the way that you normally do. This information is not intended to replace advice given to you by your health care provider. Make sure you discuss any questions you have with your health care provider. Document Released: 12/18/2000 Document Revised: 10/14/2015 Document Reviewed: 08/31/2015 Elsevier Interactive Patient Education  2018 [employer].  Heartburn Heartburn is a type of pain or discomfort that can happen in the throat or chest. It is often described as a burning pain. It may also cause a bad taste in the mouth. Heartburn may feel worse when you lie down or bend over, and it is often worse at night. Heartburn may be caused by  stomach contents that move back up into the esophagus (reflux). Follow these instructions at home: Take these actions to decrease your discomfort and to help avoid complications. Diet  Follow a diet as recommended by your health care provider. This may involve avoiding foods and drinks such as: ? Coffee and tea (with or without caffeine). ? Drinks that contain alcohol. ? Energy drinks and sports drinks. ? Carbonated drinks or sodas. ? Chocolate and cocoa. ? Peppermint and mint flavorings. ? Garlic and onions. ? Horseradish. ? Spicy and acidic foods, including peppers, chili powder, curry powder, vinegar, hot sauces, and barbecue sauce. ? Citrus fruit juices and citrus fruits, such as oranges, lemons, and limes. ? Tomato-based foods, such as red sauce, chili, salsa, and pizza with red sauce. ? Fried and fatty foods, such as donuts, french fries, potato chips, and high-fat dressings. ? High-fat meats, such as hot dogs and fatty cuts of red and white meats, such as rib eye steak, sausage, ham, and bacon. ? High-fat dairy  items, such as whole milk, butter, and cream cheese.  Eat small, frequent meals instead of large meals.  Avoid drinking large amounts of liquid with your meals.  Avoid eating meals during the 2-3 hours before bedtime.  Avoid lying down right after you eat.  Do not exercise right after you eat. General instructions  Pay attention to any changes in your symptoms.  Take over-the-counter and prescription medicines only as told by your health care provider. Do not take aspirin, ibuprofen, or other NSAIDs unless your health care provider told you to do so.  Do not use any tobacco products, including cigarettes, chewing tobacco, and e-cigarettes. If you need help quitting, ask your health care provider.  Wear loose-fitting clothing. Do not wear anything tight around your waist that causes pressure on your abdomen.  Raise (elevate) the head of your bed about 6 inches (15  cm).  Try to reduce your stress, such as with yoga or meditation. If you need help reducing stress, ask your health care provider.  If you are overweight, reduce your weight to an amount that is healthy for you. Ask your health care provider for guidance about a safe weight loss goal.  Keep all follow-up visits as told by your health care provider. This is important. Contact a health care provider if:  You have new symptoms.  You have unexplained weight loss.  You have difficulty swallowing, or it hurts to swallow.  You have wheezing or a persistent cough.  Your symptoms do not improve with treatment.  You have frequent heartburn for more than two weeks. Get help right away if:  You have pain in your arms, neck, jaw, teeth, or back.  You feel sweaty, dizzy, or light-headed.  You have chest pain or shortness of breath.  You vomit and your vomit looks like blood or coffee grounds.  Your stool is bloody or black. This information is not intended to replace advice given to you by your health care provider. Make sure you discuss any questions you have with your health care provider. Document Released: 08/11/2008 Document Revised: 08/31/2015 Document Reviewed: 07/20/2014 Elsevier Interactive Patient Education  2017 [employer].

## 2016-10-18 LAB — PROTIME-INR
INR: 4.7 — AB
Prothrombin Time: 48.6 s — ABNORMAL HIGH (ref 9.0–11.5)

## 2016-10-21 ENCOUNTER — Other Ambulatory Visit: Payer: Self-pay | Admitting: *Deleted

## 2016-10-21 MED ORDER — HYDROXYZINE HCL 25 MG PO TABS: ORAL_TABLET | ORAL | 1 refills | Status: DC

## 2016-10-23 ENCOUNTER — Telehealth: Payer: Self-pay

## 2016-10-23 NOTE — Telephone Encounter (Signed)
Spoke with patient in order to inform that his insurance denied payment for HYDROXYZINE 25mg s would not be covered. Patient states that he was aware of this & he has always paid out of pocket for the HYDROXYZINE. Pt states he uses the [facility] : $13.00. Pt voiced understanding & hung up

## 2016-10-29 ENCOUNTER — Other Ambulatory Visit: Payer: Self-pay | Admitting: Internal Medicine

## 2016-10-29 ENCOUNTER — Encounter: Payer: Self-pay | Admitting: Internal Medicine

## 2016-10-29 MED ORDER — TRAZODONE HCL 150 MG PO TABS: ORAL_TABLET | ORAL | 0 refills | Status: DC

## 2016-11-15 NOTE — Progress Notes (Addendum)
Coumadin follow up  Patient is on Coumadin for Persistent atrial fibrillation ([place]) [I48.1]  Patient's last INR is  Lab Results  Component Value Date   INR 4.7 (H) 10/17/2016   INR 2.1 (H) 09/09/2016   INR 2.6 (H) 07/10/2016    Patient denies SOB, CP, dizziness, nose bleeds, easy bleeding, and blood in stool/urine. He is on 7.5 W and 5mg  the rest.  His coumadin dose was changed last visit. He has not taken ABX, has not missed any doses and denies a fall.   BMI is Body mass index is 28.89 kg/m., he is working on diet and exercise.  He has DM with CKD, states sugars have been controlled at home. He has been very stressed, wife, Lelon Frohlich has been in hospital for psychosis, she is doing better but still has decreased appetite and has had weight loss. No night sweats.  Wt Readings from Last 3 Encounters:  11/18/16 190 lb (86.2 kg)  10/17/16 194 lb (88 kg)  10/11/16 203 lb (92.1 kg)   Blood pressure 120/88, pulse 60, temperature (!) 97.5 F (36.4 C), height 5\' 8"  (1.727 m), weight 190 lb (86.2 kg), SpO2 98 %.   Current Outpatient Prescriptions on File Prior to Visit  Medication Sig Dispense Refill  . aspirin EC 81 MG tablet Take 81 mg by mouth daily.    . betamethasone dipropionate (DIPROLENE) 0.05 % cream Apply topically.    . Black Pepper-Turmeric (TURMERIC COMPLEX/BLACK PEPPER PO) Take 1,500 mg by mouth daily.    Marland Kitchen buPROPion (WELLBUTRIN XL) 300 MG 24 hr tablet TAKE ONE TABLET BY MOUTH EVERY MORNING 90 tablet 1  . Cholecalciferol (VITAMIN D PO) Take 1,000 Int'l Units by mouth daily.     . finasteride (PROSCAR) 5 MG tablet TAKE 1 TABLET BY MOUTH DAILY 90 tablet 1  . glucose blood (FREESTYLE TEST STRIPS) test strip Check blood sugar 3 to 4 times daily for medication regulation. 450 each PRN  . hydrOXYzine (ATARAX/VISTARIL) 25 MG tablet Take 1-2 tablets by mouth 2 to 4 times daily as needed for anxiety. 120 tablet 1  . hydrOXYzine (VISTARIL) 25 MG capsule TK 1 TO 2 CS PO 2 TO 4 XD PRA  3  .  insulin NPH-regular Human (NOVOLIN 70/30) (70-30) 100 UNIT/ML injection Take 20-30 units 2 x day or as directed 10 mL 99  . latanoprost (XALATAN) 0.005 % ophthalmic solution INT 1 GTT IN EACH EYE QHS  4  . MAGNESIUM-OXIDE 400 (241.3 Mg) MG tablet TK 1 T PO BID  6  . mycophenolate (MYFORTIC) 360 MG TBEC EC tablet TAKE 1 TABLET BY MOUTH TWICE DAILY 180 tablet 1  . pantoprazole (PROTONIX) 40 MG tablet TAKE 1 TABLET(40 MG) BY MOUTH DAILY 90 tablet 0  . tacrolimus (PROGRAF) 0.5 MG capsule TK 1 C PO BID  8  . tacrolimus (PROGRAF) 1 MG capsule Take 1 mg by mouth 2 (two) times daily.   2  . TRAVATAN Z 0.004 % SOLN ophthalmic solution Place 1 drop into both eyes at bedtime.     . traZODone (DESYREL) 150 MG tablet TAKE 1/2 TO 1 TABLET BY MOUTH EVERY NIGHT 1 HOUR BEFORE BEDTIME 90 tablet 0  . warfarin (COUMADIN) 5 MG tablet TAKE 1 TO 2 TABLETS BY MOUTH DAILY AS DIRECTED 180 tablet 1  . atorvastatin (LIPITOR) 80 MG tablet Take 1/2 to 1 tablet daily or as directed for cholesterol 90 tablet 1  . promethazine (PHENERGAN) 25 MG tablet Take 1 tablet (25 mg  total) by mouth every 6 (six) hours as needed for nausea or vomiting. Max: 4 tablets per day (Patient not taking: Reported on 11/18/2016) 30 tablet 0   No current facility-administered medications on file prior to visit.    Past Medical History:  Diagnosis Date  . Adenomatous colon polyp   . Allergy   . BPH (benign prostatic hyperplasia)   . CHF (congestive heart failure) ([place])   . 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 Diabetes mellitus type 2, controlled ([place])   . Glaucoma   . Gout   . 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 MI (myocardial infarction) ([place]) 09/26/2011  . OSA (obstructive sleep apnea)   . PAF (paroxysmal atrial fibrillation) ([place])    s/p DC-CV in 6/13. Off coumadin due to ureteral bleed   Allergies  Allergen Reactions  . Crestor [Rosuvastatin]     Elevated LFT's  .  Lorazepam   . Losartan     ROS Review of Systems  Constitutional: Negative for chills, diaphoresis, fever, malaise/fatigue and weight loss.  HENT: Negative.   Eyes: Negative.   Respiratory: Negative for cough, hemoptysis, sputum production, shortness of breath and wheezing.   Cardiovascular: Negative for chest pain, palpitations, orthopnea, claudication, leg swelling and PND.  Gastrointestinal: Negative for abdominal pain, blood in stool, constipation, diarrhea, heartburn, melena, nausea and vomiting.  Genitourinary: Negative.  Negative for dysuria.  Musculoskeletal: Negative for back pain, falls, joint pain, myalgias and neck pain.  Skin: Negative.  Negative for itching and rash.  Neurological: Negative for dizziness, tingling, tremors, sensory change, speech change, focal weakness, seizures, loss of consciousness, weakness and headaches.  Endo/Heme/Allergies: Negative.   Psychiatric/Behavioral: Negative.      Physical: Blood pressure 120/88, pulse 60, temperature (!) 97.5 F (36.4 C), height 5\' 8"  (1.727 m), weight 190 lb (86.2 kg), SpO2 98 %. Filed Weights   11/18/16 1047  Weight: 190 lb (86.2 kg)    General Appearance: Well nourished, in no apparent distress. ENT/Mouth: Nares clear with no erythema, swelling, mucus on turbinates. No ulcers, cracking, on lips, tongue dry. No erythema, swelling, or exudate on post pharynx.  Neck: Supple, thyroid normal.  Respiratory: CTAB, O2 98% RA   Cardio: NSR with PVCs, holosystolic murmur no, rubs or gallops. No edema. Thrill palpated left arm.  Abdomen: Soft, with bowl sounds. diffuse tenderness without guarding or rebound, no hernias, masses, or organomegaly.  Skin: Warm, dry without rashes, lesion. + ecchymosis bilateral arms, left arm with scaly erythematous 0.5cm patch.  Neuro: Unremarkable   Assessment and plan:  Afib Continue coumadin, rate controlled Follow up in 4-6 weeks check INR, getting labs done for nephrology, will  get INR there and send them to [place] and will adjust medication according to labs.  Discussed if patient falls to immediately contact office or go to ER. Discussed foods that can increase or decrease Coumadin levels. Patient understands to call the office before starting a new medication.  CHF, systolic Weight is down, monitor weight, avoid salt/fluid restrict  DM 1 with CKD Continue to monitor sugars at home  thombocytopenia Monitor CBC, stable at this time  Depression - continue medications, stress management techniques discussed, increase water, good sleep hygiene discussed, increase exercise, and increase veggies.    Future Appointments Date Time Provider Jet  12/19/2016 9:45 AM [name], MD [facility] None  01/31/2017 10:00 AM [name], MD [facility] None

## 2016-11-18 ENCOUNTER — Encounter: Payer: Self-pay | Admitting: Physician Assistant

## 2016-11-18 ENCOUNTER — Ambulatory Visit (INDEPENDENT_AMBULATORY_CARE_PROVIDER_SITE_OTHER): Payer: Medicare Other | Admitting: Physician Assistant

## 2016-11-18 VITALS — BP 120/88 | HR 60 | Temp 97.5°F | Ht 68.0 in | Wt 190.0 lb

## 2016-11-18 DIAGNOSIS — D696 Thrombocytopenia, unspecified: Secondary | ICD-10-CM | POA: Diagnosis not present

## 2016-11-18 DIAGNOSIS — I5022 Chronic systolic (congestive) heart failure: Secondary | ICD-10-CM

## 2016-11-18 DIAGNOSIS — I4819 Other persistent atrial fibrillation: Secondary | ICD-10-CM

## 2016-11-18 DIAGNOSIS — F325 Major depressive disorder, single episode, in full remission: Secondary | ICD-10-CM

## 2016-11-18 DIAGNOSIS — N182 Chronic kidney disease, stage 2 (mild): Secondary | ICD-10-CM

## 2016-11-18 DIAGNOSIS — I481 Persistent atrial fibrillation: Secondary | ICD-10-CM | POA: Diagnosis not present

## 2016-11-18 DIAGNOSIS — E1022 Type 1 diabetes mellitus with diabetic chronic kidney disease: Secondary | ICD-10-CM

## 2016-11-18 LAB — BASIC METABOLIC PANEL WITH GFR
BUN: 17 mg/dL (ref 7–25)
CHLORIDE: 104 mmol/L (ref 98–110)
CO2: 26 mmol/L (ref 20–32)
Calcium: 9.7 mg/dL (ref 8.6–10.3)
Creat: 1.14 mg/dL (ref 0.70–1.25)
GFR, EST AFRICAN AMERICAN: 76 mL/min (ref >=60)
GFR, EST NON AFRICAN AMERICAN: 66 mL/min (ref >=60)
Glucose, Bld: 102 mg/dL — ABNORMAL HIGH (ref 65–99)
POTASSIUM: 4.8 mmol/L (ref 3.5–5.3)
SODIUM: 140 mmol/L (ref 135–146)

## 2016-11-18 LAB — CBC WITH DIFFERENTIAL/PLATELET
BASOS PCT: 1 %
Basophils Absolute: 45 cells/uL (ref 0–200)
EOS ABS: 90 {cells}/uL (ref 15–500)
Eosinophils Relative: 2 %
HCT: 41.4 % (ref 38.5–50.0)
Hemoglobin: 14 g/dL (ref 13.2–17.1)
LYMPHS PCT: 18 %
Lymphs Abs: 810 cells/uL — ABNORMAL LOW (ref 850–3900)
MCH: 29.4 pg (ref 27.0–33.0)
MCHC: 33.8 g/dL (ref 32.0–36.0)
MCV: 86.8 fL (ref 80.0–100.0)
MONOS PCT: 6 %
MPV: 9.4 fL (ref 7.5–12.5)
Monocytes Absolute: 270 cells/uL (ref 200–950)
NEUTROS ABS: 3285 {cells}/uL (ref 1500–7800)
Neutrophils Relative %: 73 %
PLATELETS: 141 10*3/uL (ref 140–400)
RBC: 4.77 MIL/uL (ref 4.20–5.80)
RDW: 13.6 % (ref 11.0–15.0)
WBC: 4.5 10*3/uL (ref 3.8–10.8)

## 2016-11-19 ENCOUNTER — Other Ambulatory Visit: Payer: Self-pay | Admitting: Internal Medicine

## 2016-11-19 LAB — PROTIME-INR
INR: 1.8 — AB
PROTHROMBIN TIME: 19 s — AB (ref 9.0–11.5)

## 2016-12-19 ENCOUNTER — Encounter: Payer: Self-pay | Admitting: Internal Medicine

## 2016-12-19 ENCOUNTER — Ambulatory Visit (INDEPENDENT_AMBULATORY_CARE_PROVIDER_SITE_OTHER): Payer: Medicare Other | Admitting: Internal Medicine

## 2016-12-19 VITALS — BP 120/60 | HR 81 | Temp 97.3°F | Resp 14 | Ht 68.0 in | Wt 193.2 lb

## 2016-12-19 DIAGNOSIS — E1122 Type 2 diabetes mellitus with diabetic chronic kidney disease: Secondary | ICD-10-CM

## 2016-12-19 DIAGNOSIS — I5022 Chronic systolic (congestive) heart failure: Secondary | ICD-10-CM

## 2016-12-19 DIAGNOSIS — I10 Essential (primary) hypertension: Secondary | ICD-10-CM | POA: Diagnosis not present

## 2016-12-19 DIAGNOSIS — E559 Vitamin D deficiency, unspecified: Secondary | ICD-10-CM

## 2016-12-19 DIAGNOSIS — F325 Major depressive disorder, single episode, in full remission: Secondary | ICD-10-CM | POA: Diagnosis not present

## 2016-12-19 DIAGNOSIS — E782 Mixed hyperlipidemia: Secondary | ICD-10-CM | POA: Diagnosis not present

## 2016-12-19 DIAGNOSIS — N182 Chronic kidney disease, stage 2 (mild): Secondary | ICD-10-CM

## 2016-12-19 DIAGNOSIS — D696 Thrombocytopenia, unspecified: Secondary | ICD-10-CM

## 2016-12-19 DIAGNOSIS — Z7901 Long term (current) use of anticoagulants: Secondary | ICD-10-CM

## 2016-12-19 DIAGNOSIS — Z794 Long term (current) use of insulin: Secondary | ICD-10-CM

## 2016-12-19 DIAGNOSIS — N185 Chronic kidney disease, stage 5: Secondary | ICD-10-CM

## 2016-12-19 DIAGNOSIS — I480 Paroxysmal atrial fibrillation: Secondary | ICD-10-CM

## 2016-12-19 DIAGNOSIS — M1000 Idiopathic gout, unspecified site: Secondary | ICD-10-CM

## 2016-12-19 DIAGNOSIS — Z79899 Other long term (current) drug therapy: Secondary | ICD-10-CM

## 2016-12-19 DIAGNOSIS — Z940 Kidney transplant status: Secondary | ICD-10-CM

## 2016-12-19 NOTE — Progress Notes (Signed)
This very nice 71 y.o. MWM presents for 6 month follow up with Hypertension, Hyperlipidemia, Ins Req T2_DM and Vitamin D Deficiency. 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     Patient is treated for HTN since 1998 & BP has been controlled at home. Today's BP is at goal - 120/60.  Patient had an AcMI  P/O his Renal Transplant in June 2013 and then CABG (& P/O pAfib) in Nov 2013.  Since then , he's done well w/o CP.  Patient has had no complaints of any palpitations, dyspnea/orthopnea/PND, dizziness, claudication, or dependent edema.     Hyperlipidemia is controlled with diet & meds. Patient denies myalgias or other med SE's. Last Lipids were at goal: Lab Results  Component Value Date   CHOL 157 09/09/2016   HDL 65 09/09/2016   LDLCALC 75 09/09/2016   TRIG 84 09/09/2016   CHOLHDL 2.4 09/09/2016      Also, the patient has history of Ins Req T2_IDDM and Transplanted Kidney is stable at Stage 2 with GFR 66 ml/min. He has had no symptoms of reactive hypoglycemia, diabetic polys, paresthesias or visual blurring.  Last A1c was not at goal: Lab Results  Component Value Date   HGBA1C 6.3 (H) 09/09/2016      Further, the patient also has history of Vitamin D Deficiency ("24" / 2014)  and supplements vitamin D without any suspected side-effects. Last vitamin D was at goal:  Lab Results  Component Value Date   VD25OH 97 09/09/2016   Current Outpatient Prescriptions on File Prior to Visit  Medication Sig  . aspirin EC 81 MG tablet Take 81 mg by mouth daily.  . betamethasone dipropionate (DIPROLENE) 0.05 % cream Apply topically.  . Black Pepper-Turmeric (TURMERIC COMPLEX/BLACK PEPPER PO) Take 1,500 mg by mouth daily.  Marland Kitchen buPROPion (WELLBUTRIN XL) 300 MG 24 hr tablet TAKE ONE TABLET BY MOUTH EVERY MORNING  . Cholecalciferol (VITAMIN D PO) Take 1,000 Int'l Units by mouth daily.   . finasteride (PROSCAR) 5 MG tablet TAKE 1 TABLET BY MOUTH DAILY   . glucose blood (FREESTYLE TEST STRIPS) test strip Check blood sugar 3 to 4 times daily for medication regulation.  . hydrOXYzine (VISTARIL) 25 MG capsule TAKE 1 TO 2 CAPSULES BY MOUTH 2 TO 4 TIMES DAILY AS NEEDED FOR ANXIETY  . insulin NPH-regular Human (NOVOLIN 70/30) (70-30) 100 UNIT/ML injection Take 20-30 units 2 x day or as directed  . latanoprost (XALATAN) 0.005 % ophthalmic solution INT 1 GTT IN EACH EYE QHS  . MAGNESIUM-OXIDE 400 (241.3 Mg) MG tablet TK 1 T PO BID  . mycophenolate (MYFORTIC) 360 MG TBEC EC tablet TAKE 1 TABLET BY MOUTH TWICE DAILY  . tacrolimus (PROGRAF) 0.5 MG capsule TK 1 C PO BID  . tacrolimus (PROGRAF) 1 MG capsule Take 1 mg by mouth 2 (two) times daily.   . TRAVATAN Z 0.004 % SOLN ophthalmic solution Place 1 drop into both eyes at bedtime.   . traZODone (DESYREL) 150 MG tablet TAKE 1/2 TO 1 TABLET BY MOUTH EVERY NIGHT 1 HOUR BEFORE BEDTIME  . warfarin (COUMADIN) 5 MG tablet TAKE 1 TO 2 TABLETS BY MOUTH DAILY AS DIRECTED  . atorvastatin (LIPITOR) 80 MG tablet Take 1/2 to 1 tablet daily or as directed for cholesterol   No current facility-administered medications on file prior to visit.    Allergies  Allergen Reactions  . Crestor [Rosuvastatin]  Elevated LFT's  . Lorazepam   . Losartan    PMHx:   Past Medical History:  Diagnosis Date  . Adenomatous colon polyp   . Allergy   . BPH (benign prostatic hyperplasia)   . CHF (congestive heart failure) ([place])   . 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 Diabetes mellitus type 2, controlled ([place])   . Glaucoma   . Gout   . 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 MI (myocardial infarction) ([place]) 09/26/2011  . OSA (obstructive sleep apnea)   . PAF (paroxysmal atrial fibrillation) ([place])    s/p DC-CV in 6/13. Off coumadin due to ureteral bleed   Immunization History  Administered Date(s) Administered  . DT 06/05/2015  . DTaP 04/08/2008  . Hepatitis B  04/08/2009  . Influenza, High Dose Seasonal PF 12/27/2013, 01/17/2015, 01/11/2016  . Pneumococcal Conjugate-13 10/25/2013  . Pneumococcal Polysaccharide-23 06/05/2015  . Pneumococcal-Unspecified 04/08/2008   Past Surgical History:  Procedure Laterality Date  . AV FISTULA PLACEMENT  2011   Left forearm  . COLONOSCOPY    . CORONARY ANGIOPLASTY WITH STENT PLACEMENT    . CORONARY ARTERY BYPASS GRAFT  2013  . KIDNEY TRANSPLANT  09/25/2011  . PERITONEAL CATHETER INSERTION  2011   FHx:    Reviewed / unchanged  SHx:    Reviewed / unchanged  Systems Review:  Constitutional: Denies fever, chills, wt changes, headaches, insomnia, fatigue, night sweats, change in appetite. Eyes: Denies redness, blurred vision, diplopia, discharge, itchy, watery eyes.  ENT: Denies discharge, congestion, post nasal drip, epistaxis, sore throat, earache, hearing loss, dental pain, tinnitus, vertigo, sinus pain, snoring.  CV: Denies chest pain, palpitations, irregular heartbeat, syncope, dyspnea, diaphoresis, orthopnea, PND, claudication or edema. Respiratory: denies cough, dyspnea, DOE, pleurisy, hoarseness, laryngitis, wheezing.  Gastrointestinal: Denies dysphagia, odynophagia, heartburn, reflux, water brash, abdominal pain or cramps, nausea, vomiting, bloating, diarrhea, constipation, hematemesis, melena, hematochezia  or hemorrhoids. Genitourinary: Denies dysuria, frequency, urgency, nocturia, hesitancy, discharge, hematuria or flank pain. Musculoskeletal: Denies arthralgias, myalgias, stiffness, jt. swelling, pain, limping or strain/sprain.  Skin: Denies pruritus, rash, hives, warts, acne, eczema or change in skin lesion(s). Neuro: No weakness, tremor, incoordination, spasms, paresthesia or pain. Psychiatric: Denies confusion, memory loss or sensory loss. Endo: Denies change in weight, skin or hair change.  Heme/Lymph: No excessive bleeding, bruising or enlarged lymph nodes.  Physical Exam  BP 120/60    Pulse 81   Temp (!) 97.3 F (36.3 C)   Resp 14   Ht 5\' 8"  (1.727 m)   Wt 193 lb 3.2 oz (87.6 kg)   BMI 29.38 kg/m   Appears well nourished, well groomed  and in no distress.  Eyes: PERRLA, EOMs, conjunctiva no swelling or erythema. Sinuses: No frontal/maxillary tenderness ENT/Mouth: EAC's clear, TM's nl w/o erythema, bulging. Nares clear w/o erythema, swelling, exudates. Oropharynx clear without erythema or exudates. Oral hygiene is good. Tongue normal, non obstructing. Hearing intact.  Neck: Supple. Thyroid nl. Car 2+/2+ without bruits, nodes or JVD.( Has sys murmer transmitter to Rt neck) Chest: Respirations nl with BS clear & equal w/o rales, rhonchi, wheezing or stridor.  Cor: Heart sounds normal w/ regular rate and rhythm with Gr 2/4 Ao Sys  Murmur transmitted to the Rt Carotid. Peripheral pulses normal and equal  without edema. Lt Brachiocephalic thrill/bruit over his AVF. Abdomen: Soft & bowel sounds normal. Non-tender w/o guarding, rebound, hernias, masses or organomegaly.  Lymphatics: Unremarkable.  Musculoskeletal: Full  ROM all peripheral extremities, joint stability, 5/5 strength and normal gait.  Skin: Warm, dry without exposed rashes, lesions or ecchymosis apparent.  Neuro: Cranial nerves intact, reflexes equal bilaterally. Sensory-motor testing grossly intact. Tendon reflexes grossly intact.  Pysch: Alert & oriented x 3.  Insight and judgement nl & appropriate. No ideations.  Assessment and Plan:  1. Essential hypertension  - Continue medication, monitor blood pressure at home.  - Continue DASH diet. Reminder to go to the ER if any CP,  SOB, nausea, dizziness, severe HA, changes vision/speech.  - CBC with Differential/Platelet - BASIC METABOLIC PANEL WITH GFR - Magnesium - TSH  2. Hyperlipidemia, mixed  - Continue diet/meds, exercise,& lifestyle modifications.  - Continue monitor periodic cholesterol/liver & renal functions   - Hepatic function panel - Lipid  panel - TSH  3. Type 2 diabetes mellitus with stage 2 chronic kidney disease, with long-term current use of insulin ([facility])  - Continue diet, exercise, lifestyle modifications.  - Monitor appropriate labs.  - Hemoglobin A1c  4. Vitamin D deficiency  - Continue supplementation.  - VITAMIN D 25 Hydroxy  5. Renal transplant, status post   6. Idiopathic gout  - Uric acid  7. PAF (paroxysmal atrial fibrillation) ([facility])  - Protime-INR  8. Long term current use of anticoagulant therapy  - Protime-INR  9. Medication management  - CBC with Differential/Platelet - BASIC METABOLIC PANEL WITH GFR - Hepatic function panel - Magnesium - Lipid panel - TSH - Hemoglobin A1c - VITAMIN D 25 Hydroxy  - Uric acid      Discussed  regular exercise, BP monitoring, weight control to achieve/maintain BMI less than 25 and discussed med and SE's. Recommended labs to assess and monitor clinical status with further disposition pending results of labs. Over 30 minutes of exam, counseling, chart review was performed.

## 2016-12-19 NOTE — Patient Instructions (Signed)
Bleeding Precautions When on Anticoagulant Therapy  WHAT IS ANTICOAGULANT THERAPY? Anticoagulant therapy is taking medicine to prevent or reduce blood clots. It is also called blood thinner therapy. Blood clots that form in your blood vessels can be dangerous. They can break loose and travel to your heart, lungs, or brain. This increases your risk of a heart attack or stroke. Anticoagulant therapy causes blood to clot more slowly. You may need anticoagulant therapy if you have:  A medical condition that increases the likelihood that blood clots will form.  A heart defect or a problem with heart rhythm. It is also a common treatment after heart surgery, such as valve replacement. WHAT ARE COMMON TYPES OF ANTICOAGULANT THERAPY? Anticoagulant medicine can be injected or taken by mouth.If you need anticoagulant therapy quickly at the hospital, the medicine may be injected under your skin or given through an IV tube. Heparin is a common example of an anticoagulant that you may get at the hospital. Most anticoagulant therapy is in the form of pills that you take at home every day. These may include:  Aspirin. This common blood thinner works by preventing blood cells (platelets) from sticking together to form a clot. Aspirin is not as strong as anticoagulants that slow down the time that it takes for your body to form a clot.  Clopidogrel. This is a newer type of drug that affects platelets. It is stronger than aspirin.  Warfarin. This is the most common anticoagulant. It changes the way your body uses vitamin K, a vitamin that helps your blood to clot. The risk of bleeding is higher with warfarin than with aspirin. You will need frequent blood tests to make sure you are taking the safest amount.  New anticoagulants. Several new drugs have been approved. They are all taken by mouth. Studies show that these drugs work as well as warfarin. They do not require blood testing. They may cause less bleeding  risk than warfarin. WHAT DO I NEED TO REMEMBER WHEN TAKING ANTICOAGULANT THERAPY? Anticoagulant therapy decreases your risk of forming a blood clot, but it increases your risk of bleeding. Work closely with your health care provider to make sure you are taking your medicine safely. These tips can help:  Learn ways to reduce your risk of bleeding.  If you are taking warfarin: ? Have blood tests as ordered by your health care provider. ? Do not make any sudden changes to your diet. Vitamin K in your diet can make warfarin less effective. ? Do not get pregnant. This medicine may cause birth defects.  Take your medicine at the same time every day. If you forget to take your medicine, take it as soon as you remember. If you miss a whole day, do not double your dose of medicine. Take your normal dose and call your health care provider to check in.  Do not stop taking your medicine on your own.  Tell your health care provider before you start taking any new medicine, vitamin, or herbal product. Some of these could interfere with your therapy.  Tell all of your health care providers that you are on anticoagulant therapy.  Do not have surgery, medical procedures, or dental work until you tell your health care provider that you are on anticoagulant therapy. WHAT CAN AFFECT HOW ANTICOAGULANTS WORK? Certain foods, vitamins, medicines, supplements, and herbal medicines change the way that anticoagulant therapy works. They may increase or decrease the effects of your anticoagulant therapy. Either result can be dangerous for you.    Many over-the-counter medicines for pain, colds, or stomach problems interfere with anticoagulant therapy. Take these only as told by your health care provider.  Do not drink alcohol. It can interfere with your medicine and increase your risk of an injury that causes bleeding.  If you are taking warfarin, do not begin eating more foods that contain vitamin K. These include  leafy green vegetables. Ask your health care provider if you should avoid any foods. WHAT ARE SOME WAYS TO PREVENT BLEEDING? You can prevent bleeding by taking certain precautions:  Be extra careful when you use knives, scissors, or other sharp objects.  Use an electric razor instead of a blade.  Do not use toothpicks.  Use a soft toothbrush.  Wear shoes that have nonskid soles.  Use bath mats and handrails in your bathroom.  Wear gloves while you do yard work.  Wear a helmet when you ride a bike.  Wear your seat belt.  Prevent falls by removing loose rugs and extension cords from areas where you walk.  Do not play contact sports or participate in other activities that have a high risk of injury. WHEN SHOULD I CONTACT MY HEALTH CARE PROVIDER? Call your health care provider if:  You miss a dose of medicine: ? And you are not sure what to do. ? For more than one day.  You have: ? Menstrual bleeding that is heavier than normal. ? Blood in your urine. ? A bloody nose or bleeding gums. ? Easy bruising. ? Blood in your stool (feces) or have black and tarry stool. ? Side effects from your medicine.  You feel weak or dizzy.  You become pregnant. Seek immediate medical care if:  You have bleeding that will not stop.  You have sudden and severe headache or belly pain.  You vomit or you cough up bright red blood.  You have a severe blow to your head. WHAT ARE SOME QUESTIONS TO ASK MY HEALTH CARE PROVIDER?  What is the best anticoagulant therapy for my condition?  What side effects should I watch for?  When should I take my medicine? What should I do if I forget to take it?  Will I need to have regular blood tests?  Do I need to change my diet? Are there foods or drinks that I should avoid?  What activities are safe for me?  What should I do if I want to get pregnant? This information is not intended to replace advice given to you by your health care provider.  Make sure you discuss any questions you have with your health care provider. Document Released: 03/06/2015 Document Reviewed: 03/06/2015 Elsevier Interactive Patient Education  2017 [employer]  ++++++++++++++++++++++++++++++++++ Recommend Adult Low Dose Aspirin or  coated  Aspirin 81 mg daily  To reduce risk of Colon Cancer 20 %,  Skin Cancer 26 % ,  Melanoma 46%  and  Pancreatic cancer 60% +++++++++++++++++++++++++ Vitamin D goal  is between 70-100.  Please make sure that you are taking your Vitamin D as directed.  It is very important as a natural anti-inflammatory  helping hair, skin, and nails, as well as reducing stroke and heart attack risk.  It helps your bones and helps with mood. It also decreases numerous cancer risks so please take it as directed.  Low Vit D is associated with a 200-300% higher risk for CANCER  and 200-300% higher risk for HEART   ATTACK  &  STROKE.   ...................................... It is also associated with   higher death rate at younger ages,  autoimmune diseases like Rheumatoid arthritis, Lupus, Multiple Sclerosis.    Also many other serious conditions, like depression, Alzheimer's Dementia, infertility, muscle aches, fatigue, fibromyalgia - just to name a few. ++++++++++++++++++++ Recommend the book "The END of DIETING" by Dr Joel Fuhrman  & the book "The END of DIABETES " by Dr Joel Fuhrman At [employer] - get book & Audio CD's    Being diabetic has a  300% increased risk for heart attack, stroke, cancer, and alzheimer- type vascular dementia. It is very important that you work harder with diet by avoiding all foods that are white. Avoid white rice (brown & wild rice is OK), white potatoes (sweetpotatoes in moderation is OK), White bread or wheat bread or anything made out of white flour like bagels, donuts, rolls, buns, biscuits, cakes, pastries, cookies, pizza crust, and pasta (made from white flour & egg whites) - vegetarian pasta or  spinach or wheat pasta is OK. Multigrain breads like Arnold's or [employer], or multigrain sandwich thins or flatbreads.  Diet, exercise and weight loss can reverse and cure diabetes in the early stages.  Diet, exercise and weight loss is very important in the control and prevention of complications of diabetes which affects every system in your body, ie. Brain - dementia/stroke, eyes - glaucoma/blindness, heart - heart attack/heart failure, kidneys - dialysis, stomach - gastric paralysis, intestines - malabsorption, nerves - severe painful neuritis, circulation - gangrene & loss of a leg(s), and finally cancer and Alzheimers.    I recommend avoid fried & greasy foods,  sweets/candy, white rice (brown or wild rice or Quinoa is OK), white potatoes (sweet potatoes are OK) - anything made from white flour - bagels, doughnuts, rolls, buns, biscuits,white and wheat breads, pizza crust and traditional pasta made of white flour & egg white(vegetarian pasta or spinach or wheat pasta is OK).  Multi-grain bread is OK - like multi-grain flat bread or sandwich thins. Avoid alcohol in excess. Exercise is also important.    Eat all the vegetables you want - avoid meat, especially red meat and dairy - especially cheese.  Cheese is the most concentrated form of trans-fats which is the worst thing to clog up our arteries. Veggie cheese is OK which can be found in the fresh produce section at [employer] or [employer] or [employer]  +++++++++++++++++++++ DASH Eating Plan  DASH stands for "Dietary Approaches to Stop Hypertension."   The DASH eating plan is a healthy eating plan that has been shown to reduce high blood pressure (hypertension). Additional health benefits may include reducing the risk of type 2 diabetes mellitus, heart disease, and stroke. The DASH eating plan may also help with weight loss. WHAT DO I NEED TO KNOW ABOUT THE DASH EATING PLAN? For the DASH eating plan, you will follow these general  guidelines:  Choose foods with a percent daily value for sodium of less than 5% (as listed on the food label).  Use salt-free seasonings or herbs instead of table salt or sea salt.  Check with your health care provider or pharmacist before using salt substitutes.  Eat lower-sodium products, often labeled as "lower sodium" or "no salt added."  Eat fresh foods.  Eat more vegetables, fruits, and low-fat dairy products.  Choose whole grains. Look for the word "whole" as the first word in the ingredient list.  Choose fish   Limit sweets, desserts, sugars, and sugary drinks.  Choose heart-healthy fats.  Eat veggie cheese     Eat more home-cooked food and less restaurant, buffet, and fast food.  Limit fried foods.  Cook foods using methods other than frying.  Limit canned vegetables. If you do use them, rinse them well to decrease the sodium.  When eating at a restaurant, ask that your food be prepared with less salt, or no salt if possible.                      WHAT FOODS CAN I EAT? Read Dr Joel Fuhrman's books on The End of Dieting & The End of Diabetes  Grains Whole grain or whole wheat bread. Brown rice. Whole grain or whole wheat pasta. Quinoa, bulgur, and whole grain cereals. Low-sodium cereals. Corn or whole wheat flour tortillas. Whole grain cornbread. Whole grain crackers. Low-sodium crackers.  Vegetables Fresh or frozen vegetables (raw, steamed, roasted, or grilled). Low-sodium or reduced-sodium tomato and vegetable juices. Low-sodium or reduced-sodium tomato sauce and paste. Low-sodium or reduced-sodium canned vegetables.   Fruits All fresh, canned (in natural juice), or frozen fruits.  Protein Products  All fish and seafood.  Dried beans, peas, or lentils. Unsalted nuts and seeds. Unsalted canned beans.  Dairy Low-fat dairy products, such as skim or 1% milk, 2% or reduced-fat cheeses, low-fat ricotta or cottage cheese, or plain low-fat yogurt. Low-sodium or  reduced-sodium cheeses.  Fats and Oils Tub margarines without trans fats. Light or reduced-fat mayonnaise and salad dressings (reduced sodium). Avocado. Safflower, olive, or canola oils. Natural peanut or almond butter.  Other Unsalted popcorn and pretzels. The items listed above may not be a complete list of recommended foods or beverages. Contact your dietitian for more options.  +++++++++++++++  WHAT FOODS ARE NOT RECOMMENDED? Grains/ White flour or wheat flour White bread. White pasta. White rice. Refined cornbread. Bagels and croissants. Crackers that contain trans fat.  Vegetables  Creamed or fried vegetables. Vegetables in a . Regular canned vegetables. Regular canned tomato sauce and paste. Regular tomato and vegetable juices.  Fruits Dried fruits. Canned fruit in light or heavy syrup. Fruit juice.  Meat and Other Protein Products Meat in general - RED meat & White meat.  Fatty cuts of meat. Ribs, chicken wings, all processed meats as bacon, sausage, bologna, salami, fatback, hot dogs, bratwurst and packaged luncheon meats.  Dairy Whole or 2% milk, cream, half-and-half, and cream cheese. Whole-fat or sweetened yogurt. Full-fat cheeses or blue cheese. Non-dairy creamers and whipped toppings. Processed cheese, cheese spreads, or cheese curds.  Condiments Onion and garlic salt, seasoned salt, table salt, and sea salt. Canned and packaged gravies. Worcestershire sauce. Tartar sauce. Barbecue sauce. Teriyaki sauce. Soy sauce, including reduced sodium. Steak sauce. Fish sauce. Oyster sauce. Cocktail sauce. Horseradish. Ketchup and mustard. Meat flavorings and tenderizers. Bouillon cubes. Hot sauce. Tabasco sauce. Marinades. Taco seasonings. Relishes.  Fats and Oils Butter, stick margarine, lard, shortening and bacon fat. Coconut, palm kernel, or palm oils. Regular salad dressings.  Pickles and olives. Salted popcorn and pretzels.  The items listed above may not be a complete  list of foods and beverages to avoid.   

## 2016-12-20 LAB — BASIC METABOLIC PANEL WITH GFR
BUN: 19 mg/dL (ref 7–25)
CO2: 25 mmol/L (ref 20–32)
CREATININE: 1.18 mg/dL (ref 0.70–1.25)
Calcium: 9.9 mg/dL (ref 8.6–10.3)
Chloride: 102 mmol/L (ref 98–110)
GFR, EST AFRICAN AMERICAN: 73 mL/min/{1.73_m2} (ref >=60)
GFR, EST NON AFRICAN AMERICAN: 63 mL/min/{1.73_m2} (ref >=60)
Glucose, Bld: 121 mg/dL — ABNORMAL HIGH (ref 65–99)
POTASSIUM: 4.4 mmol/L (ref 3.5–5.3)
SODIUM: 140 mmol/L (ref 135–146)

## 2016-12-20 LAB — HEPATIC FUNCTION PANEL
AG RATIO: 1.8 (calc) (ref 1.0–2.5)
ALBUMIN MSPROF: 4.1 g/dL (ref 3.6–5.1)
ALT: 27 U/L (ref 9–46)
AST: 24 U/L (ref 10–35)
Alkaline phosphatase (APISO): 91 U/L (ref 40–115)
BILIRUBIN DIRECT: 0.3 mg/dL — AB (ref 0.0–0.2)
BILIRUBIN TOTAL: 1.2 mg/dL (ref 0.2–1.2)
Globulin: 2.3 g/dL (calc) (ref 1.9–3.7)
Indirect Bilirubin: 0.9 mg/dL (calc) (ref 0.2–1.2)
Total Protein: 6.4 g/dL (ref 6.1–8.1)

## 2016-12-20 LAB — CBC WITH DIFFERENTIAL/PLATELET
BASOS ABS: 42 {cells}/uL (ref 0–200)
Basophils Relative: 0.8 %
Eosinophils Absolute: 42 cells/uL (ref 15–500)
Eosinophils Relative: 0.8 %
HEMATOCRIT: 40.7 % (ref 38.5–50.0)
Hemoglobin: 13.7 g/dL (ref 13.2–17.1)
LYMPHS ABS: 957 {cells}/uL (ref 850–3900)
MCH: 29 pg (ref 27.0–33.0)
MCHC: 33.7 g/dL (ref 32.0–36.0)
MCV: 86 fL (ref 80.0–100.0)
MPV: 9.6 fL (ref 7.5–12.5)
Monocytes Relative: 7.5 %
NEUTROS PCT: 72.5 %
Neutro Abs: 3770 cells/uL (ref 1500–7800)
Platelets: 136 10*3/uL — ABNORMAL LOW (ref 140–400)
RBC: 4.73 10*6/uL (ref 4.20–5.80)
RDW: 12.5 % (ref 11.0–15.0)
Total Lymphocyte: 18.4 %
WBC: 5.2 10*3/uL (ref 3.8–10.8)
WBCMIX: 390 {cells}/uL (ref 200–950)

## 2016-12-20 LAB — LIPID PANEL
CHOL/HDL RATIO: 2.3 (calc) (ref <5.0)
Cholesterol: 131 mg/dL (ref <200)
HDL: 58 mg/dL (ref >40)
LDL CHOLESTEROL (CALC): 59 mg/dL
Non-HDL Cholesterol (Calc): 73 mg/dL (calc) (ref <130)
TRIGLYCERIDES: 55 mg/dL (ref <150)

## 2016-12-20 LAB — PROTIME-INR
INR: 2.1 — AB
Prothrombin Time: 22.2 s — ABNORMAL HIGH (ref 9.0–11.5)

## 2016-12-20 LAB — URIC ACID: URIC ACID, SERUM: 7.3 mg/dL (ref 4.0–8.0)

## 2016-12-20 LAB — HEMOGLOBIN A1C
Hgb A1c MFr Bld: 5.8 % of total Hgb — ABNORMAL HIGH (ref <5.7)
Mean Plasma Glucose: 120 (calc)
eAG (mmol/L): 6.6 (calc)

## 2016-12-20 LAB — MAGNESIUM: Magnesium: 1.7 mg/dL (ref 1.5–2.5)

## 2016-12-20 LAB — VITAMIN D 25 HYDROXY (VIT D DEFICIENCY, FRACTURES): Vit D, 25-Hydroxy: 108 ng/mL — ABNORMAL HIGH (ref 30–100)

## 2016-12-20 LAB — TSH: TSH: 1.38 m[IU]/L (ref 0.40–4.50)

## 2016-12-21 ENCOUNTER — Encounter: Payer: Self-pay | Admitting: Internal Medicine

## 2016-12-28 ENCOUNTER — Encounter: Payer: Self-pay | Admitting: Internal Medicine

## 2016-12-28 ENCOUNTER — Other Ambulatory Visit: Payer: Self-pay | Admitting: Internal Medicine

## 2016-12-28 MED ORDER — PREDNISONE 20 MG PO TABS
ORAL_TABLET | ORAL | 0 refills | Status: DC
Start: 2016-12-28 — End: 2017-01-20

## 2016-12-28 MED ORDER — ALLOPURINOL 300 MG PO TABS: ORAL_TABLET | ORAL | 1 refills | Status: DC

## 2017-01-12 ENCOUNTER — Encounter: Payer: Self-pay | Admitting: Internal Medicine

## 2017-01-20 ENCOUNTER — Encounter: Payer: Self-pay | Admitting: Adult Health

## 2017-01-20 ENCOUNTER — Ambulatory Visit (INDEPENDENT_AMBULATORY_CARE_PROVIDER_SITE_OTHER): Payer: Medicare Other | Admitting: Adult Health

## 2017-01-20 VITALS — BP 130/76 | HR 68 | Temp 97.1°F

## 2017-01-20 DIAGNOSIS — I2510 Atherosclerotic heart disease of native coronary artery without angina pectoris: Secondary | ICD-10-CM

## 2017-01-20 DIAGNOSIS — M25572 Pain in left ankle and joints of left foot: Secondary | ICD-10-CM

## 2017-01-20 MED ORDER — DICLOFENAC SODIUM 1 % TD GEL: 4.0000 g | Freq: Four times a day (QID) | TRANSDERMAL | 3 refills | Status: DC
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
## 2017-01-20 NOTE — Patient Instructions (Signed)
Immobilize for several days, may apply heat or biofreeze if helping. Tylenol and voltaren gel for pain- call in a few days if not better, will schedule XR.    Ankle Sprain An ankle sprain is a stretch or tear in one of the tough, fiber-like tissues (ligaments) in the ankle. The ligaments in your ankle help to hold the bones of the ankle together. What are the causes? This condition is often caused by stepping on or falling on the outer edge of the foot. What increases the risk? This condition is more likely to develop in people who play sports. What are the signs or symptoms? Symptoms of this condition include:  Pain in your ankle.  Swelling.  Bruising. Bruising may develop right after you sprain your ankle or 1-2 days later.  Trouble standing or walking, especially when you turn or change directions.  How is this diagnosed? This condition is diagnosed with a physical exam. During the exam, your health care provider will press on certain parts of your foot and ankle and try to move them in certain ways. X-rays may be taken to see how severe the sprain is and to check for broken bones. How is this treated? This condition may be treated with:  A brace. This is used to keep the ankle from moving until it heals.  An elastic bandage. This is used to support the ankle.  Crutches.  Pain medicine.  Surgery. This may be needed if the sprain is severe.  Physical therapy. This may help to improve the range of motion in the ankle.  Follow these instructions at home:  Rest your ankle.  Take over-the-counter and prescription medicines only as told by your health care provider.  For 2-3 days, keep your ankle raised (elevated) above the level of your heart as much as possible.  If directed, apply ice to the area: ? Put ice in a plastic bag. ? Place a towel between your skin and the bag. ? Leave the ice on for 20 minutes, 2-3 times a day.  If you were given a brace: ? Wear it as  directed. ? Remove it to shower or bathe. ? Try not to move your ankle much, but wiggle your toes from time to time. This helps to prevent swelling.  If you were given an elastic bandage (dressing): ? Remove it to shower or bathe. ? Try not to move your ankle much, but wiggle your toes from time to time. This helps to prevent swelling. ? Adjust the dressing to make it more comfortable if it feels too tight. ? Loosen the dressing if you have numbness or tingling in your foot, or if your foot becomes cold and blue.  If you have crutches, use them as told by your health care provider. Continue to use them until you can walk without feeling pain in your ankle. Contact a health care provider if:  You have rapidly increasing bruising or swelling.  Your pain is not relieved with medicine. Get help right away if:  Your toes or foot becomes numb or blue.  You have severe pain that gets worse. This information is not intended to replace advice given to you by your health care provider. Make sure you discuss any questions you have with your health care provider. Document Released: 03/25/2005 Document Revised: 08/02/2015 Document Reviewed: 10/25/2014 Elsevier Interactive Patient Education  2017 [employer].

## 2017-01-21 ENCOUNTER — Encounter: Payer: Self-pay | Admitting: Adult Health

## 2017-01-21 ENCOUNTER — Other Ambulatory Visit: Payer: Self-pay | Admitting: Adult Health

## 2017-01-21 ENCOUNTER — Ambulatory Visit (HOSPITAL_COMMUNITY)
Admission: RE | Admit: 2017-01-21 | Discharge: 2017-01-21 | Disposition: A | Discharge status: Home / Self Care | Payer: Medicare Other | Source: Ambulatory Visit | Attending: Adult Health | Admitting: Adult Health

## 2017-01-21 DIAGNOSIS — M25572 Pain in left ankle and joints of left foot: Secondary | ICD-10-CM | POA: Diagnosis present

## 2017-01-21 MED ORDER — TRAMADOL HCL 50 MG PO TABS: 50.0000 mg | ORAL_TABLET | Freq: Four times a day (QID) | ORAL | 0 refills | Status: DC | PRN

## 2017-01-21 NOTE — Progress Notes (Unsigned)
Patient reports worsening pain x3 weeks now severe 10/10 to L ankle despite bracing/avoidance of weight bearing/ use of OTC analgesics. XR of ankle reviewed and negative. Ortho referral provided per patient request. Tramadol 50 mg q6h PRN #20 called in to pharmacy of choice.

## 2017-01-21 NOTE — Addendum Note (Signed)
Addended by: Izora Ribas on: 01/21/2017 08:26 AM   Modules accepted: Orders

## 2017-01-22 ENCOUNTER — Other Ambulatory Visit: Payer: Self-pay | Admitting: Adult Health

## 2017-01-22 ENCOUNTER — Encounter: Payer: Self-pay | Admitting: Adult Health

## 2017-01-22 ENCOUNTER — Ambulatory Visit (INDEPENDENT_AMBULATORY_CARE_PROVIDER_SITE_OTHER): Payer: Medicare Other | Admitting: Orthopedic Surgery

## 2017-01-22 ENCOUNTER — Encounter (INDEPENDENT_AMBULATORY_CARE_PROVIDER_SITE_OTHER): Payer: Self-pay | Admitting: Orthopedic Surgery

## 2017-01-22 VITALS — BP 123/66 | HR 58 | Ht 68.0 in | Wt 195.0 lb

## 2017-01-22 DIAGNOSIS — I2510 Atherosclerotic heart disease of native coronary artery without angina pectoris: Secondary | ICD-10-CM | POA: Diagnosis not present

## 2017-01-22 DIAGNOSIS — M25572 Pain in left ankle and joints of left foot: Secondary | ICD-10-CM

## 2017-01-22 DIAGNOSIS — S93401A Sprain of unspecified ligament of right ankle, initial encounter: Secondary | ICD-10-CM | POA: Diagnosis not present

## 2017-01-22 MED ORDER — TRAMADOL HCL 50 MG PO TABS: 50.0000 mg | ORAL_TABLET | Freq: Four times a day (QID) | ORAL | 0 refills | Status: DC | PRN
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
## 2017-01-22 NOTE — Progress Notes (Signed)
Tramadol has been called into pharmacy on 17th Oct 2018 by DD

## 2017-01-23 ENCOUNTER — Ambulatory Visit (INDEPENDENT_AMBULATORY_CARE_PROVIDER_SITE_OTHER): Payer: Self-pay | Admitting: Orthopaedic Surgery

## 2017-01-31 ENCOUNTER — Ambulatory Visit (INDEPENDENT_AMBULATORY_CARE_PROVIDER_SITE_OTHER): Payer: Medicare Other | Admitting: Internal Medicine

## 2017-01-31 ENCOUNTER — Ambulatory Visit (INDEPENDENT_AMBULATORY_CARE_PROVIDER_SITE_OTHER): Payer: Medicare Other | Admitting: Orthopaedic Surgery

## 2017-01-31 VITALS — BP 110/72 | HR 80 | Temp 97.3°F | Resp 18 | Ht 67.5 in | Wt 194.6 lb

## 2017-01-31 DIAGNOSIS — I480 Paroxysmal atrial fibrillation: Secondary | ICD-10-CM | POA: Diagnosis not present

## 2017-01-31 DIAGNOSIS — E1122 Type 2 diabetes mellitus with diabetic chronic kidney disease: Secondary | ICD-10-CM

## 2017-01-31 DIAGNOSIS — F17200 Nicotine dependence, unspecified, uncomplicated: Secondary | ICD-10-CM

## 2017-01-31 DIAGNOSIS — Z79899 Other long term (current) drug therapy: Secondary | ICD-10-CM

## 2017-01-31 DIAGNOSIS — N1831 Chronic kidney disease, stage 3a: Secondary | ICD-10-CM

## 2017-01-31 DIAGNOSIS — E782 Mixed hyperlipidemia: Secondary | ICD-10-CM | POA: Diagnosis not present

## 2017-01-31 DIAGNOSIS — Z136 Encounter for screening for cardiovascular disorders: Secondary | ICD-10-CM | POA: Diagnosis not present

## 2017-01-31 DIAGNOSIS — Z1212 Encounter for screening for malignant neoplasm of rectum: Secondary | ICD-10-CM

## 2017-01-31 DIAGNOSIS — Z940 Kidney transplant status: Secondary | ICD-10-CM

## 2017-01-31 DIAGNOSIS — N183 Chronic kidney disease, stage 3 (moderate): Secondary | ICD-10-CM

## 2017-01-31 DIAGNOSIS — F325 Major depressive disorder, single episode, in full remission: Secondary | ICD-10-CM

## 2017-01-31 DIAGNOSIS — S93412D Sprain of calcaneofibular ligament of left ankle, subsequent encounter: Secondary | ICD-10-CM | POA: Diagnosis not present

## 2017-01-31 DIAGNOSIS — I2510 Atherosclerotic heart disease of native coronary artery without angina pectoris: Secondary | ICD-10-CM | POA: Diagnosis not present

## 2017-01-31 DIAGNOSIS — I10 Essential (primary) hypertension: Secondary | ICD-10-CM

## 2017-01-31 DIAGNOSIS — E559 Vitamin D deficiency, unspecified: Secondary | ICD-10-CM

## 2017-01-31 DIAGNOSIS — I25708 Atherosclerosis of coronary artery bypass graft(s), unspecified, with other forms of angina pectoris: Secondary | ICD-10-CM

## 2017-01-31 DIAGNOSIS — M1000 Idiopathic gout, unspecified site: Secondary | ICD-10-CM

## 2017-01-31 DIAGNOSIS — M1030 Gout due to renal impairment, unspecified site: Secondary | ICD-10-CM

## 2017-01-31 DIAGNOSIS — N182 Chronic kidney disease, stage 2 (mild): Secondary | ICD-10-CM | POA: Diagnosis not present

## 2017-01-31 DIAGNOSIS — Z1211 Encounter for screening for malignant neoplasm of colon: Secondary | ICD-10-CM

## 2017-01-31 DIAGNOSIS — Z794 Long term (current) use of insulin: Secondary | ICD-10-CM | POA: Diagnosis not present

## 2017-01-31 DIAGNOSIS — Z125 Encounter for screening for malignant neoplasm of prostate: Secondary | ICD-10-CM

## 2017-01-31 DIAGNOSIS — N401 Enlarged prostate with lower urinary tract symptoms: Secondary | ICD-10-CM

## 2017-01-31 NOTE — Patient Instructions (Signed)

## 2017-01-31 NOTE — Progress Notes (Signed)
[facility]   [name], M.D.     [name]. [name], P.A.-C Liane Comber, [place]                [address] [place], [place] [address] Telephone [phone] Telefax [phone] Annual  Screening/Preventative Visit  & Comprehensive Evaluation & Examination     This very nice 71 y.o. MWM presents for a  comprehensive evaluation and management of multiple medical co-morbidities.  Patient has been followed for HTN, ASCAD/CABG, T2_IDDM, Hyperlipidemia and Vitamin D Deficiency. 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 He is stable on Prograf followed closely By Dr Erling Cruz. Patient also has hx/o Depression in remission on current meds.  Patient has hx/o Gout controlled w/Allopurinol.     HTN predates since 1998. Patient's BP has been controlled at home.  Today's BP is at goal - 110/72.  Following his Renal Transplant in 09/2011, he experienced an Acute MI and had PTCA. Then in Nov 2013, he underwent CABG and had PO pAfib (had CV and coumadin was d/c'd due to ureteral bleeding). Patient denies any cardiac symptoms as chest pain, palpitations, shortness of breath, dizziness or ankle swelling.     Patient's hyperlipidemia is controlled with diet and medications. Patient denies myalgias or other medication SE's.Current  lipids ware at goal: Lab Results  Component Value Date   CHOL 164 01/31/2017   HDL 57 01/31/2017   LDLCALC 75 09/09/2016   TRIG 151 (H) 01/31/2017   CHOLHDL 2.9 01/31/2017      Patient has Insulin requiring/Dependent T2_IDDM / stable CKD2  (GFR 66) with his transplanted Kidney.  He denies reactive hypoglycemic symptoms, visual blurring, diabetic polys or paresthesias. Current  A1c is at goal: Lab Results  Component Value Date   HGBA1C 5.9 (H) 01/31/2017       Finally, patient has history of Vitamin D Deficiency  of  "24" in 2014 and current  vitamin D isat goal: Lab Results  Component Value Date   VD25OH 93 01/31/2017   Current Outpatient Prescriptions on File Prior to Visit  Medication Sig  . allopurinol (ZYLOPRIM) 300 MG tablet Take 1 tablet daily to prevent Gout  . aspirin EC 81 MG tablet Take 81 mg by mouth daily.  . betamethasone dipropionate (DIPROLENE) 0.05 % cream Apply topically.  . Black Pepper-Turmeric (TURMERIC COMPLEX/BLACK PEPPER PO) Take 1,500 mg by mouth daily.  Marland Kitchen buPROPion (WELLBUTRIN XL) 300 MG 24 hr tablet TAKE ONE TABLET BY MOUTH EVERY MORNING  . Cholecalciferol (VITAMIN D PO) Take 1,000 Int'l Units by mouth daily.   . diazepam (VALIUM) 5 MG tablet Take 5 mg by mouth daily.  Marland Kitchen dicyclomine (BENTYL) 10 MG capsule   . finasteride (PROSCAR) 5 MG tablet TAKE 1 TABLET BY MOUTH DAILY  . glucose blood (FREESTYLE TEST STRIPS) test strip Check blood sugar 3 to 4 times daily for medication regulation.  . insulin NPH-regular Human (NOVOLIN 70/30) (70-30) 100 UNIT/ML injection Take 20-30 units 2 x day or as directed  . latanoprost (XALATAN) 0.005 % ophthalmic solution INT 1 GTT IN EACH EYE QHS  . MAGNESIUM-OXIDE 400 (241.3 Mg) MG tablet TK 1 T PO BID  . mycophenolate (MYFORTIC) 360 MG TBEC EC tablet TAKE 1 TABLET BY MOUTH TWICE DAILY  . tacrolimus (PROGRAF) 1 MG capsule Take  1 mg by mouth 2 (two) times daily.   . traMADol (ULTRAM) 50 MG tablet Take 1 tablet (50 mg total) by mouth every 6 (six) hours as needed.  . warfarin (COUMADIN) 5 MG tablet TAKE 1 TO 2 TABLETS BY MOUTH DAILY AS DIRECTED  . atorvastatin (LIPITOR) 80 MG tablet Take 1/2 to 1 tablet daily or as directed for cholesterol   No current facility-administered medications on file prior to visit.    Allergies  Allergen Reactions  . Crestor [Rosuvastatin]     Elevated LFT's  . Lorazepam   . Losartan    Past Medical History:  Diagnosis Date  . Adenomatous colon polyp   . Allergy   . BPH (benign prostatic hyperplasia)   .  CHF (congestive heart failure) ([place])   . 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 Diabetes mellitus type 2, controlled ([place])   . Glaucoma   . Gout   . 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 MI (myocardial infarction) ([place]) 09/26/2011  . OSA (obstructive sleep apnea)   . PAF (paroxysmal atrial fibrillation) ([place])    s/p DC-CV in 6/13. Off coumadin due to ureteral bleed   Health Maintenance  Topic Date Due  . Hepatitis C Screening  1946-02-13  . URINE MICROALBUMIN  01/10/2017  . OPHTHALMOLOGY EXAM  02/20/2017  . HEMOGLOBIN A1C  06/18/2017  . FOOT EXAM  01/31/2018  . TETANUS/TDAP  04/08/2018  . COLONOSCOPY  03/13/2021  . INFLUENZA VACCINE  Completed  . PNA vac Low Risk Adult  Completed   Immunization History  Administered Date(s) Administered  . DT 06/05/2015  . Hepatitis B 04/08/2009  . Influenza, High Dose Seasonal PF 12/27/2013, 01/17/2015, 01/11/2016  . Influenza-Unspecified 12/25/2016  . Pneumococcal Conjugate-13 10/25/2013  . Pneumococcal Polysaccharide-23 06/05/2015  . Pneumococcal-Unspecified 04/08/2008  . Tdap 04/08/2008   Past Surgical History:  Procedure Laterality Date  . AV FISTULA PLACEMENT  2011   Left forearm  . COLONOSCOPY    . CORONARY ANGIOPLASTY WITH STENT PLACEMENT    . CORONARY ARTERY BYPASS GRAFT  2013  . KIDNEY TRANSPLANT  09/25/2011  . PERITONEAL CATHETER INSERTION  2011   Family History  Problem Relation Age of Onset  . Heart attack Father   . Diabetes Mother   . COPD Mother   . Kidney disease Sister   . Colon cancer Neg Hx   . Stomach cancer Neg Hx   . Rectal cancer Neg Hx   . Esophageal cancer Neg Hx   . Liver cancer Neg Hx    Social History   Social History  . Marital status: Married    Spouse name: N/A  . Number of children: 1  . Years of education: N/A   Occupational History  . retired    Social History Main Topics  . Smoking status: Former Smoker    Quit date:  11/29/1990  . Smokeless tobacco: Never Used  . Alcohol use Yes     Comment: 1-2 a month  . Drug use: No  . Sexual activity: Not on file   Other Topics Concern  . Not on file   Social History Narrative  . No narrative on file    ROS Constitutional: Denies fever, chills, weight loss/gain, headaches, insomnia,  night sweats or change in appetite. Does c/o fatigue. Eyes: Denies redness, blurred vision, diplopia, discharge, itchy or watery eyes.  ENT: Denies discharge, congestion, post nasal drip, epistaxis, sore  throat, earache, hearing loss, dental pain, Tinnitus, Vertigo, Sinus pain or snoring.  Cardio: Denies chest pain, palpitations, irregular heartbeat, syncope, dyspnea, diaphoresis, orthopnea, PND, claudication or edema Respiratory: denies cough, dyspnea, DOE, pleurisy, hoarseness, laryngitis or wheezing.  Gastrointestinal: Denies dysphagia, heartburn, reflux, water brash, pain, cramps, nausea, vomiting, bloating, diarrhea, constipation, hematemesis, melena, hematochezia, jaundice or hemorrhoids Genitourinary: Denies dysuria, frequency, urgency, nocturia, hesitancy, discharge, hematuria or flank pain Musculoskeletal: Denies arthralgia, myalgia, stiffness, Jt. Swelling, pain, limp or strain/sprain. Denies Falls. Skin: Denies puritis, rash, hives, warts, acne, eczema or change in skin lesion Neuro: No weakness, tremor, incoordination, spasms, paresthesia or pain Psychiatric: Denies confusion, memory loss or sensory loss. Denies Depression. Endocrine: Denies change in weight, skin, hair change, nocturia, and paresthesia, diabetic polys, visual blurring or hyper / hypo glycemic episodes.  Heme/Lymph: No excessive bleeding, bruising or enlarged lymph nodes.  Physical Exam  BP 110/72   Pulse 80   Temp (!) 97.3 F (36.3 C)   Resp 18   Ht 5' 7.5" (1.715 m)   Wt 194 lb 9.6 oz (88.3 kg)   BMI 30.03 kg/m   General Appearance: Over nourished and well groomed and in no apparent  distress.  Eyes: PERRLA, EOMs, conjunctiva no swelling or erythema, normal fundi and vessels. Sinuses: No frontal/maxillary tenderness ENT/Mouth: EACs patent / TMs  nl. Nares clear without erythema, swelling, mucoid exudates. Oral hygiene is good. No erythema, swelling, or exudate. Tongue normal, non-obstructing. Tonsils not swollen or erythematous. Hearing normal.  Neck: Supple, thyroid normal. No bruits, nodes or JVD. Respiratory: Respiratory effort normal.  BS equal and clear bilateral without rales, rhonci, wheezing or stridor. Cardio: Heart sounds are normal with regular rate and rhythm and no murmurs, rubs or gallops. Peripheral pulses are normal and equal bilaterally without edema. No aortic or femoral bruits. Chest: Median sternotomy scar. Symmetric with normal excursions and percussion.  Abdomen: Soft, with Nl bowel sounds. Nontender, no guarding, rebound, hernias, masses, or organomegaly.  Lymphatics: Non tender without lymphadenopathy.  Genitourinary: No hernias.Testes nl. DRE - prostate nl for age - smooth & firm w/o nodules. Musculoskeletal: Full ROM all peripheral extremities, joint stability, 5/5 strength, and normal gait. Skin: Warm and dry without rashes, lesions, cyanosis, clubbing or  ecchymosis.  Neuro: Cranial nerves intact, reflexes equal bilaterally. Normal muscle tone, no cerebellar symptoms. Sensation intact to touch, vibratory and Monofilament to the toes bilaterally. Pysch: Alert and oriented X 3 with normal affect, insight and judgment appropriate.   Assessment and Plan  1. Essential hypertension  - EKG 12-Lead - [place], RETROPERITNL ABD,  LTD - Urinalysis, Routine w reflex microscopic - Microalbumin / creatinine urine ratio - CBC with Differential/Platelet - BASIC METABOLIC PANEL WITH GFR - Magnesium - TSH  2. Hyperlipidemia, mixed  - EKG 12-Lead - [place], RETROPERITNL ABD,  LTD - Hepatic function panel - Lipid panel - TSH  3. Type 2 diabetes mellitus with  stage 2 chronic kidney disease, with long-term current use of insulin (HCC)  - EKG 12-Lead - [place], RETROPERITNL ABD,  LTD - HM DIABETES FOOT EXAM - LOW EXTREMITY NEUR EXAM DOCUM - Hemoglobin A1c  4. Vitamin D deficiency  - VITAMIN D 25 Hydroxy  5. Renal transplant, status post   6. PAF (paroxysmal atrial fibrillation) (HCC)  - EKG 12-Lead - TSH - Protime-INR  7. Depression, major, in remission ([place])   8. Chronic kidney disease (CKD) stage G3a/A1, moderately decreased glomerular filtration rate (GFR) between 45-59 mL/min/1.73 square meter and albuminuria creatinine ratio less than  30 mg/g (HCC)  - Urinalysis, Routine w reflex microscopic - Microalbumin / creatinine urine ratio - BASIC METABOLIC PANEL WITH GFR  9. Coronary artery disease involving coronary bypass graft of native heart with other forms of angina pectoris (HCC)  - EKG 12-Lead  10. Gout due to renal impairment  - Uric acid  11. Screening for colorectal cancer  - POC Hemoccult Bld/Stl  12. Prostate cancer screening  - PSA  13. Screening for ischemic heart disease  - EKG 12-Lead  14. Screening for AAA (aortic abdominal aneurysm)  - [place], RETROPERITNL ABD,  LTD  15. Smoker  - [place], RETROPERITNL ABD,  LTD  16. Medication management  - Urinalysis, Routine w reflex microscopic - Microalbumin / creatinine urine ratio  17. Benign prostatic hyperplasia with lower urinary tract symptoms, symptom details unspecified  - PSA      Patient was counseled in prudent diet, weight control to achieve/maintain BMI less than 25, BP monitoring, regular exercise and medications as discussed.  Discussed med effects and SE's. Routine screening labs and tests as requested with regular follow-up as recommended. Over 40 minutes of exam, counseling, chart review and high complex critical decision making was performed
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
## 2017-02-01 ENCOUNTER — Encounter: Payer: Self-pay | Admitting: Internal Medicine

## 2017-02-01 ENCOUNTER — Other Ambulatory Visit: Payer: Self-pay | Admitting: Internal Medicine

## 2017-02-01 DIAGNOSIS — I480 Paroxysmal atrial fibrillation: Secondary | ICD-10-CM

## 2017-02-01 DIAGNOSIS — Z7901 Long term (current) use of anticoagulants: Secondary | ICD-10-CM

## 2017-02-01 LAB — PROTIME-INR
INR: 1.1
PROTHROMBIN TIME: 11.6 s — AB (ref 9.0–11.5)

## 2017-02-01 LAB — LIPID PANEL
Cholesterol: 164 mg/dL (ref <200)
HDL: 57 mg/dL (ref >40)
LDL CHOLESTEROL (CALC): 82 mg/dL
NON-HDL CHOLESTEROL (CALC): 107 mg/dL (ref <130)
TRIGLYCERIDES: 151 mg/dL — AB (ref <150)
Total CHOL/HDL Ratio: 2.9 (calc) (ref <5.0)

## 2017-02-01 LAB — HEPATIC FUNCTION PANEL
AG Ratio: 1.5 (calc) (ref 1.0–2.5)
ALT: 23 U/L (ref 9–46)
AST: 21 U/L (ref 10–35)
Albumin: 3.9 g/dL (ref 3.6–5.1)
Alkaline phosphatase (APISO): 109 U/L (ref 40–115)
BILIRUBIN DIRECT: 0.2 mg/dL (ref 0.0–0.2)
BILIRUBIN INDIRECT: 1 mg/dL (ref 0.2–1.2)
BILIRUBIN TOTAL: 1.2 mg/dL (ref 0.2–1.2)
GLOBULIN: 2.6 g/dL (ref 1.9–3.7)
Total Protein: 6.5 g/dL (ref 6.1–8.1)

## 2017-02-01 LAB — CBC WITH DIFFERENTIAL/PLATELET
Basophils Absolute: 52 cells/uL (ref 0–200)
Basophils Relative: 1.1 %
EOS ABS: 71 {cells}/uL (ref 15–500)
Eosinophils Relative: 1.5 %
HEMATOCRIT: 43.3 % (ref 38.5–50.0)
Hemoglobin: 14.6 g/dL (ref 13.2–17.1)
Lymphs Abs: 837 cells/uL — ABNORMAL LOW (ref 850–3900)
MCH: 29.9 pg (ref 27.0–33.0)
MCHC: 33.7 g/dL (ref 32.0–36.0)
MCV: 88.7 fL (ref 80.0–100.0)
MPV: 10.2 fL (ref 7.5–12.5)
Monocytes Relative: 6.8 %
NEUTROS PCT: 72.8 %
Neutro Abs: 3422 cells/uL (ref 1500–7800)
PLATELETS: 164 10*3/uL (ref 140–400)
RBC: 4.88 10*6/uL (ref 4.20–5.80)
RDW: 13.1 % (ref 11.0–15.0)
TOTAL LYMPHOCYTE: 17.8 %
WBC: 4.7 10*3/uL (ref 3.8–10.8)
WBCMIX: 320 {cells}/uL (ref 200–950)

## 2017-02-01 LAB — BASIC METABOLIC PANEL WITH GFR
BUN: 17 mg/dL (ref 7–25)
CHLORIDE: 102 mmol/L (ref 98–110)
CO2: 29 mmol/L (ref 20–32)
Calcium: 9.7 mg/dL (ref 8.6–10.3)
Creat: 1.22 mg/dL (ref 0.70–1.25)
GFR, EST AFRICAN AMERICAN: 70 mL/min/{1.73_m2} (ref >=60)
GFR, EST NON AFRICAN AMERICAN: 61 mL/min/{1.73_m2} (ref >=60)
Glucose, Bld: 162 mg/dL — ABNORMAL HIGH (ref 65–99)
POTASSIUM: 4.4 mmol/L (ref 3.5–5.3)
Sodium: 140 mmol/L (ref 135–146)

## 2017-02-01 LAB — HEMOGLOBIN A1C
EAG (MMOL/L): 6.8 (calc)
Hgb A1c MFr Bld: 5.9 % of total Hgb — ABNORMAL HIGH (ref <5.7)
Mean Plasma Glucose: 123 (calc)

## 2017-02-01 LAB — TSH: TSH: 1.86 m[IU]/L (ref 0.40–4.50)

## 2017-02-01 LAB — URIC ACID: URIC ACID, SERUM: 4.8 mg/dL (ref 4.0–8.0)

## 2017-02-01 LAB — VITAMIN D 25 HYDROXY (VIT D DEFICIENCY, FRACTURES): VIT D 25 HYDROXY: 93 ng/mL (ref 30–100)

## 2017-02-01 LAB — PSA: PSA: <0.1 ng/mL (ref <=4.0)

## 2017-02-01 LAB — MAGNESIUM: MAGNESIUM: 1.7 mg/dL (ref 1.5–2.5)

## 2017-02-03 ENCOUNTER — Other Ambulatory Visit: Payer: Medicare Other

## 2017-02-03 DIAGNOSIS — Z7901 Long term (current) use of anticoagulants: Secondary | ICD-10-CM

## 2017-02-03 DIAGNOSIS — I480 Paroxysmal atrial fibrillation: Secondary | ICD-10-CM

## 2017-02-04 LAB — PROTIME-INR
INR: 1
PROTHROMBIN TIME: 11 s (ref 9.0–11.5)

## 2017-02-04 LAB — URINALYSIS, ROUTINE W REFLEX MICROSCOPIC
Bilirubin Urine: NEGATIVE
GLUCOSE, UA: NEGATIVE
HGB URINE DIPSTICK: NEGATIVE
Ketones, ur: NEGATIVE
LEUKOCYTES UA: NEGATIVE
NITRITE: NEGATIVE
PH: 7 (ref 5.0–8.0)
PROTEIN: NEGATIVE
Specific Gravity, Urine: 1.021 (ref 1.001–1.03)

## 2017-02-04 LAB — MICROALBUMIN / CREATININE URINE RATIO
Creatinine, Urine: 146 mg/dL (ref 20–320)
MICROALB UR: 1.4 mg/dL
MICROALB/CREAT RATIO: 10 ug/mg{creat} (ref <30)

## 2017-02-05 ENCOUNTER — Other Ambulatory Visit: Payer: Self-pay | Admitting: Internal Medicine

## 2017-02-05 DIAGNOSIS — Z7901 Long term (current) use of anticoagulants: Secondary | ICD-10-CM

## 2017-02-05 DIAGNOSIS — I480 Paroxysmal atrial fibrillation: Secondary | ICD-10-CM

## 2017-02-10 ENCOUNTER — Telehealth: Payer: Self-pay | Admitting: *Deleted

## 2017-02-10 ENCOUNTER — Other Ambulatory Visit: Payer: Self-pay | Admitting: Internal Medicine

## 2017-02-10 ENCOUNTER — Other Ambulatory Visit: Payer: Medicare Other

## 2017-02-10 DIAGNOSIS — Z7901 Long term (current) use of anticoagulants: Secondary | ICD-10-CM

## 2017-02-10 DIAGNOSIS — I480 Paroxysmal atrial fibrillation: Secondary | ICD-10-CM

## 2017-02-10 LAB — PROTIME-INR
INR: 3 — ABNORMAL HIGH
Prothrombin Time: 31 s — ABNORMAL HIGH (ref 9.0–11.5)

## 2017-02-10 NOTE — Telephone Encounter (Signed)
Pt aware of lab results and was advised to continue the same dose until his next OV.

## 2017-02-19 ENCOUNTER — Encounter (INDEPENDENT_AMBULATORY_CARE_PROVIDER_SITE_OTHER): Payer: Self-pay

## 2017-02-19 ENCOUNTER — Other Ambulatory Visit: Payer: Self-pay | Admitting: Internal Medicine

## 2017-02-19 DIAGNOSIS — M1A9XX0 Chronic gout, unspecified, without tophus (tophi): Secondary | ICD-10-CM

## 2017-02-19 MED ORDER — PREDNISONE 20 MG PO TABS: ORAL_TABLET | ORAL | 0 refills | Status: DC

## 2017-03-03 ENCOUNTER — Encounter: Payer: Self-pay | Admitting: Adult Health

## 2017-03-03 NOTE — Progress Notes (Signed)
FOLLOW UP  Assessment and Plan:   Hypertension Well controlled with current medications  Monitor blood pressure at home; patient to call if consistently greater than 130/80 Continue DASH diet.   Reminder to go to the ER if any CP, SOB, nausea, dizziness, severe HA, changes vision/speech, left arm numbness and tingling and jaw pain.  CHF Disease process and medications discussed. Questions answered fully. Emphasized salt restriction, less than 2000mg  a day. Encouraged daily monitoring of the patient's weight, call office if 5 lb weight loss or gain in a day.  Encouraged regular exercise. If any increasing shortness of breath, swelling, or chest pressure go to ER immediately.  decrease your fluid intake to less than 2 L daily please remember to always increase your potassium intake with any increase of your fluid pill.   Stage 3 CKD Maintain hydration, avoid NSAIDS, monitor sugars, will monitor BMP with GFR  Gout/ Pseudogout flare Resolved with prednisone - has restarted allopurinol without issues Check CBC, BMP Diet discussed  Thrombocytopenia Monitor CBCs closely, currently stable without signs of excessive bleeding, refer to hematology as indicated  Left calf pain Reduced pulse to extremity with + Homan's - will send for vascular ultrasound to rule out DVT  Continue diet and meds as discussed. Further disposition pending results of labs. Discussed med's effects and SE's.   Over 30 minutes of exam, counseling, chart review, and critical decision making was performed.   Future Appointments  Date Time Provider [place]  04/03/2017  9:30 AM Vicie Mutters, PA-C [facility] None  02/27/2018  9:00 AM [name], MD [facility] None    ----------------------------------------------------------------------------------------------------------------------  HPI 71 y.o. male  presents for 1 month follow up for coumadin management and evaluation of chronic medical  conditions for gout with recent flare, CHF and monitoring of stage 3 CKD and thrombocytopenia. He reports calf pain x2 days- worse at night, causing pain with ambulation. Denies swelling. He did have an ankle sprain 2 weeks ago but reports that pain has resolved.   Patient is on Coumadin for PAF (paroxysmal atrial fibrillation) ([place]) [I48.0] Patient's last INR is  Lab Results  Component Value Date   INR 3.0 (H) 02/10/2017   INR 1.0 02/03/2017   INR 1.1 01/31/2017    Patient denies SOB, CP, dizziness, nose bleeds, easy bleeding, and blood in stool/urine. His coumadin dose was not changed last visit. He has not taken ABX, has not missed any doses and endorses two mechanical falls but denies injury.    5 mg 3 x / week and 1/2 tab (2.5 mg) 4 x / week   His blood pressure has been controlled at home, today their BP is BP: 130/70  He does not workout. He denies chest pain, shortness of breath, dizziness. Weights are stable.   BMI is Body mass index is 30.09 kg/m. Wt Readings from Last 3 Encounters:  03/04/17 195 lb (88.5 kg)  01/31/17 194 lb 9.6 oz (88.3 kg)  01/22/17 195 lb (88.5 kg)   Patient was not on allopurinol for gout and does report a recent flare- has since restarted allopurinol.  Lab Results  Component Value Date   LABURIC 4.8 01/31/2017     Current Medications:  Current Outpatient Medications on File Prior to Visit  Medication Sig  . allopurinol (ZYLOPRIM) 300 MG tablet Take 1 tablet daily to prevent Gout  . aspirin EC 81 MG tablet Take 81 mg by mouth daily.  . betamethasone dipropionate (DIPROLENE) 0.05 % cream Apply topically.  Marland Kitchen  Black Pepper-Turmeric (TURMERIC COMPLEX/BLACK PEPPER PO) Take 1,500 mg by mouth daily.  Marland Kitchen buPROPion (WELLBUTRIN XL) 300 MG 24 hr tablet TAKE ONE TABLET BY MOUTH EVERY MORNING  . Cholecalciferol (VITAMIN D PO) Take 1,000 Int'l Units by mouth daily.   . diazepam (VALIUM) 5 MG tablet Take 5 mg by mouth daily.  . diclofenac sodium (VOLTAREN) 1 %  GEL APP 4 GRAMS EXT AA QID  . dicyclomine (BENTYL) 10 MG capsule   . finasteride (PROSCAR) 5 MG tablet TAKE 1 TABLET BY MOUTH DAILY  . glucose blood (FREESTYLE TEST STRIPS) test strip Check blood sugar 3 to 4 times daily for medication regulation.  . insulin NPH-regular Human (NOVOLIN 70/30) (70-30) 100 UNIT/ML injection Take 20-30 units 2 x day or as directed  . latanoprost (XALATAN) 0.005 % ophthalmic solution INT 1 GTT IN EACH EYE QHS  . MAGNESIUM-OXIDE 400 (241.3 Mg) MG tablet TK 1 T PO BID  . mycophenolate (MYFORTIC) 360 MG TBEC EC tablet TAKE 1 TABLET BY MOUTH TWICE DAILY  . predniSONE (DELTASONE) 20 MG tablet 1 tab 3 x day for 2 days, then 1 tab 2 x day for 2 days, then 1 tab 1 x day for 3 days  . tacrolimus (PROGRAF) 0.5 MG capsule TK 1 C PO BID  . traMADol (ULTRAM) 50 MG tablet Take 1 tablet (50 mg total) by mouth every 6 (six) hours as needed.  . warfarin (COUMADIN) 5 MG tablet TAKE 1 TO 2 TABLETS BY MOUTH DAILY AS DIRECTED  . atorvastatin (LIPITOR) 80 MG tablet Take 1/2 to 1 tablet daily or as directed for cholesterol   No current facility-administered medications on file prior to visit.      Allergies:  Allergies  Allergen Reactions  . Crestor [Rosuvastatin]     Elevated LFT's  . Lorazepam   . Losartan      Medical History:  Past Medical History:  Diagnosis Date  . Adenomatous colon polyp   . Allergy   . BPH (benign prostatic hyperplasia)   . CHF (congestive heart failure) ([place])   . 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 Diabetes mellitus type 2, controlled ([place])   . Glaucoma   . Gout   . 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 MI (myocardial infarction) ([place]) 09/26/2011  . NSTEMI (non-ST elevated myocardial infarction) ([place]) 11/26/2013   2013   . OSA (obstructive sleep apnea)   . PAF (paroxysmal atrial fibrillation) ([place])    s/p DC-CV in 6/13. Off coumadin due to ureteral bleed   Family history-  Reviewed and unchanged Social history- Reviewed and unchanged   Review of Systems:  Review of Systems  Constitutional: Negative for malaise/fatigue and weight loss.  HENT: Negative for hearing loss and tinnitus.   Eyes: Negative for blurred vision and double vision.  Respiratory: Negative for cough, shortness of breath and wheezing.   Cardiovascular: Negative for chest pain, palpitations, orthopnea, claudication and leg swelling.  Gastrointestinal: Negative for abdominal pain, blood in stool, constipation, diarrhea, heartburn, melena, nausea and vomiting.  Genitourinary: Negative.   Musculoskeletal: Positive for falls (Mechanical - tripped over stool in bathroom - denies injury). Negative for joint pain and myalgias.  Skin: Negative for rash.  Neurological: Negative for dizziness, tingling, sensory change, weakness and headaches.  Endo/Heme/Allergies: Negative for polydipsia.  Psychiatric/Behavioral: Negative.   All other systems reviewed and are negative.   Physical Exam: BP 130/70   Pulse 64  Temp 97.6 F (36.4 C)   Ht 5' 7.5" (1.715 m)   Wt 195 lb (88.5 kg)   SpO2 97%   BMI 30.09 kg/m  Wt Readings from Last 3 Encounters:  03/04/17 195 lb (88.5 kg)  01/31/17 194 lb 9.6 oz (88.3 kg)  01/22/17 195 lb (88.5 kg)   General Appearance: Well nourished, in no apparent distress. Neck: thyroid normal.  Respiratory: Respiratory effort normal, BS equal bilaterally without rales, rhonchi, wheezing or stridor.  Cardio: RRR with no MRGs. Brisk peripheral pulses to RLE - LLE pulses thready/absent - without edema. LLE calf not enlarged, + homan's sign Abdomen: Soft, + BS.  Non tender, no guarding, rebound, hernias, masses. Lymphatics: Non tender without lymphadenopathy.  Musculoskeletal: Full ROM, 5/5 strength, Antalgic gait Skin: Warm, dry without rashes, lesions, ecchymosis.  Neuro: Cranial nerves intact. No cerebellar symptoms.  Psych: Awake and oriented X 3, normal affect, Insight  and Judgment appropriate.    Izora Ribas, NP 9:05 AM [facility]

## 2017-03-04 ENCOUNTER — Encounter: Payer: Self-pay | Admitting: Adult Health

## 2017-03-04 ENCOUNTER — Ambulatory Visit (INDEPENDENT_AMBULATORY_CARE_PROVIDER_SITE_OTHER): Payer: Medicare Other | Admitting: Adult Health

## 2017-03-04 VITALS — BP 130/70 | HR 64 | Temp 97.6°F | Ht 67.5 in | Wt 195.0 lb

## 2017-03-04 DIAGNOSIS — N183 Chronic kidney disease, stage 3 (moderate): Secondary | ICD-10-CM | POA: Diagnosis not present

## 2017-03-04 DIAGNOSIS — N1831 Chronic kidney disease, stage 3a: Secondary | ICD-10-CM

## 2017-03-04 DIAGNOSIS — I5022 Chronic systolic (congestive) heart failure: Secondary | ICD-10-CM

## 2017-03-04 DIAGNOSIS — D696 Thrombocytopenia, unspecified: Secondary | ICD-10-CM

## 2017-03-04 DIAGNOSIS — Z7901 Long term (current) use of anticoagulants: Secondary | ICD-10-CM | POA: Diagnosis not present

## 2017-03-04 DIAGNOSIS — Z79899 Other long term (current) drug therapy: Secondary | ICD-10-CM

## 2017-03-04 DIAGNOSIS — M1A3790 Chronic gout due to renal impairment, unspecified ankle and foot, without tophus (tophi): Secondary | ICD-10-CM | POA: Diagnosis not present

## 2017-03-04 DIAGNOSIS — M79662 Pain in left lower leg: Secondary | ICD-10-CM

## 2017-03-04 DIAGNOSIS — I480 Paroxysmal atrial fibrillation: Secondary | ICD-10-CM | POA: Diagnosis not present

## 2017-03-04 NOTE — Patient Instructions (Addendum)
Try salon pas and voltaren gel prescription that you have for calf muscle - avoid stretching/massaging until we get the ultrasound.    Deep Vein Thrombosis A deep vein thrombosis (DVT) is a blood clot (thrombus) that usually occurs in a deep, larger vein of the lower leg or the pelvis, or in an upper extremity such as the arm. These are dangerous and can lead to serious and even life-threatening complications if the clot travels to the lungs. A DVT can damage the valves in your leg veins so that instead of flowing upward, the blood pools in the lower leg. This is called post-thrombotic syndrome, and it can result in pain, swelling, discoloration, and sores on the leg. What are the causes? A DVT is caused by the formation of a blood clot in your leg, pelvis, or arm. Usually, several things contribute to the formation of blood clots. A clot may develop when:  Your blood flow slows down.  Your vein becomes damaged in some way.  You have a condition that makes your blood clot more easily.  What increases the risk? A DVT is more likely to develop in:  People who are older, especially over 44 years of age.  People who are overweight (obese).  People who sit or lie still for a long time, such as during long-distance travel (over 4 hours), bed rest, hospitalization, or during recovery from certain medical conditions like a stroke.  People who do not engage in much physical activity (sedentary lifestyle).  People who have chronic breathing disorders.  People who have a personal or family history of blood clots or blood clotting disease.  People who have peripheral vascular disease (PVD), diabetes, or some types of cancer.  People who have heart disease, especially if the person had a recent heart attack or has congestive heart failure.  People who have neurological diseases that affect the legs (leg paresis).  People who have had a traumatic injury, such as breaking a hip or leg.  People  who have recently had major or lengthy surgery, especially on the hip, knee, or abdomen.  People who have had a central line placed inside a large vein.  People who take medicines that contain the hormone estrogen. These include birth control pills and hormone replacement therapy.  Pregnancy or during childbirth or the postpartum period.  Long plane flights (over 8 hours).  What are the signs or symptoms?  Symptoms of a DVT can include:  Swelling of your leg or arm, especially if one side is much worse.  Warmth and redness of your leg or arm, especially if one side is much worse.  Pain in your arm or leg. If the clot is in your leg, symptoms may be more noticeable or worse when you stand or walk.  A feeling of pins and needles, if the clot is in the arm.  The symptoms of a DVT that has traveled to the lungs (pulmonary embolism, PE) usually start suddenly and include:  Shortness of breath while active or at rest.  Coughing or coughing up blood or blood-tinged mucus.  Chest pain that is often worse with deep breaths.  Rapid or irregular heartbeat.  Feeling light-headed or dizzy.  Fainting.  Feeling anxious.  Sweating.  There may also be pain and swelling in a leg if that is where the blood clot started. These symptoms may represent a serious problem that is an emergency. Do not wait to see if the symptoms will go away. Get medical help  right away. Call your local emergency services (911 in the [place] Do not drive yourself to the hospital. How is this diagnosed? Your health care provider will take a medical history and perform a physical exam. You may also have other tests, including:  Blood tests to assess the clotting properties of your blood.  Imaging tests, such as CT, ultrasound, MRI, X-ray, and other tests to see if you have clots anywhere in your body.  How is this treated? After a DVT is identified, it can be treated. The type of treatment that you receive  depends on many factors, such as the cause of your DVT, your risk for bleeding or developing more clots, and other medical conditions that you have. Sometimes, a combination of treatments is necessary. Treatment options may be combined and include:  Monitoring the blood clot with ultrasound.  Taking medicines by mouth, such as newer blood thinners (anticoagulants), thrombolytics, or warfarin.  Taking anticoagulant medicine by injection or through an IV tube.  Wearing compression stockings or using different types ofdevices.  Surgery (rare) to remove the blood clot or to place a filter in your abdomen to stop the blood clot from traveling to your lungs.  Treatments for a DVT are often divided into immediate treatment and long-term treatment (up to 3 months after DVT). You can work with your health care provider to choose the treatment program that is best for you. Follow these instructions at home: If you are taking a newer oral anticoagulant:  Take the medicine every single day at the same time each day.  Understand what foods and drugs interact with this medicine.  Understand that there are no regular blood tests required when using this medicine.  Understand the side effects of this medicine, including excessive bruising or bleeding. Ask your health care provider or pharmacist about other possible side effects. If you are taking warfarin:  Understand how to take warfarin and know which foods can affect how warfarin works in Veterinary surgeon.  Understand that it is dangerous to take too much or too little warfarin. Too much warfarin increases the risk of bleeding. Too little warfarin continues to allow the risk for blood clots.  Follow your PT and INR blood testing schedule. The PT and INR results allow your health care provider to adjust your dose of warfarin. It is very important that you have your PT and INR tested as often as told by your health care provider.  Avoid major changes in  your diet, or tell your health care provider before you change your diet. Arrange a visit with a registered dietitian to answer your questions. Many foods, especially foods that are high in vitamin K, can interfere with warfarin and affect the PT and INR results. Eat a consistent amount of foods that are high in vitamin K, such as: ? Spinach, kale, broccoli, cabbage, collard greens, turnip greens, Brussels sprouts, peas, cauliflower, seaweed, and parsley. ? Beef liver and pork liver. ? Green tea. ? Soybean oil.  Tell your health care provider about any and all medicines, vitamins, and supplements that you take, including aspirin and other over-the-counter anti-inflammatory medicines. Be especially cautious with aspirin and anti-inflammatory medicines. Do not take those before you ask your health care provider if it is safe to do so. This is important because many medicines can interfere with warfarin and affect the PT and INR results.  Do not start or stop taking any over-the-counter or prescription medicine unless your health care provider or pharmacist tells  you to do so. If you take warfarin, you will also need to do these things:  Hold pressure over cuts for longer than usual.  Tell your dentist and other health care providers that you are taking warfarin before you have any procedures in which bleeding may occur.  Avoid alcohol or drink very small amounts. Tell your health care provider if you change your alcohol intake.  Do not use tobacco products, including cigarettes, chewing tobacco, and e-cigarettes. If you need help quitting, ask your health care provider.  Avoid contact sports.  General instructions  Take over-the-counter and prescription medicines only as told by your health care provider. Anticoagulant medicines can have side effects, including easy bruising and difficulty stopping bleeding. If you are prescribed an anticoagulant, you will also need to do these things: ? Hold  pressure over cuts for longer than usual. ? Tell your dentist and other health care providers that you are taking anticoagulants before you have any procedures in which bleeding may occur. ? Avoid contact sports.  Wear a medical alert bracelet or carry a medical alert card that says you have had a PE.  Ask your health care provider how soon you can go back to your normal activities. Stay active to prevent new blood clots from forming.  Make sure to exercise while traveling or when you have been sitting or standing for a long period of time. It is very important to exercise. Exercise your legs by walking or by tightening and relaxing your leg muscles often. Take frequent walks.  Wear compression stockings as told by your health care provider to help prevent more blood clots from forming.  Do not use tobacco products, including cigarettes, chewing tobacco, and e-cigarettes. If you need help quitting, ask your health care provider.  Keep all follow-up appointments with your health care provider. This is important. How is this prevented? Take these actions to decrease your risk of developing another DVT:  Exercise regularly. For at least 30 minutes every day, engage in: ? Activity that involves moving your arms and legs. ? Activity that encourages good blood flow through your body by increasing your heart rate.  Exercise your arms and legs every hour during long-distance travel (over 4 hours). Drink plenty of water and avoid drinking alcohol while traveling.  Avoid sitting or lying in bed for long periods of time without moving your legs.  Maintain a weight that is appropriate for your height. Ask your health care provider what weight is healthy for you.  If you are a woman who is over 69 years of age, avoid unnecessary use of medicines that contain estrogen. These include birth control pills.  Do not smoke, especially if you take estrogen medicines. If you need help quitting, ask your  health care provider.  If you are hospitalized, prevention measures may include:  Early walking after surgery, as soon as your health care provider says that it is safe.  Receiving anticoagulants to prevent blood clots.If you cannot take anticoagulants, other options may be available, such as wearing compression stockings or using different types of devices.  Get help right away if:  You have new or increased pain, swelling, or redness in an arm or leg.  You have numbness or tingling in an arm or leg.  You have shortness of breath while active or at rest.  You have chest pain.  You have a rapid or irregular heartbeat.  You feel light-headed or dizzy.  You cough up blood.  You notice  blood in your vomit, bowel movement, or urine. These symptoms may represent a serious problem that is an emergency. Do not wait to see if the symptoms will go away. Get medical help right away. Call your local emergency services (911 in the [place] Do not drive yourself to the hospital. This information is not intended to replace advice given to you by your health care provider. Make sure you discuss any questions you have with your health care provider. Document Released: 03/25/2005 Document Revised: 08/31/2015 Document Reviewed: 07/20/2014 Elsevier Interactive Patient Education  2017 [employer].

## 2017-03-05 ENCOUNTER — Other Ambulatory Visit: Payer: Self-pay

## 2017-03-05 ENCOUNTER — Telehealth: Payer: Self-pay | Admitting: Internal Medicine

## 2017-03-05 ENCOUNTER — Ambulatory Visit (HOSPITAL_COMMUNITY)
Admission: RE | Admit: 2017-03-05 | Discharge: 2017-03-05 | Disposition: A | Discharge status: Home / Self Care | Payer: Medicare Other | Source: Ambulatory Visit | Attending: Vascular Surgery | Admitting: Vascular Surgery

## 2017-03-05 ENCOUNTER — Encounter: Payer: Self-pay | Admitting: Adult Health

## 2017-03-05 DIAGNOSIS — M79662 Pain in left lower leg: Secondary | ICD-10-CM

## 2017-03-05 DIAGNOSIS — R9439 Abnormal result of other cardiovascular function study: Secondary | ICD-10-CM | POA: Diagnosis not present

## 2017-03-05 DIAGNOSIS — Z1211 Encounter for screening for malignant neoplasm of colon: Secondary | ICD-10-CM

## 2017-03-05 DIAGNOSIS — Z1212 Encounter for screening for malignant neoplasm of rectum: Principal | ICD-10-CM

## 2017-03-05 LAB — BASIC METABOLIC PANEL WITH GFR
BUN: 21 mg/dL (ref 7–25)
CALCIUM: 9.6 mg/dL (ref 8.6–10.3)
CHLORIDE: 102 mmol/L (ref 98–110)
CO2: 27 mmol/L (ref 20–32)
Creat: 1.18 mg/dL (ref 0.70–1.25)
GFR, Est African American: 73 mL/min/{1.73_m2} (ref >=60)
GFR, Est Non African American: 63 mL/min/{1.73_m2} (ref >=60)
GLUCOSE: 166 mg/dL — AB (ref 65–99)
POTASSIUM: 4.4 mmol/L (ref 3.5–5.3)
Sodium: 137 mmol/L (ref 135–146)

## 2017-03-05 LAB — POC HEMOCCULT BLD/STL (HOME/3-CARD/SCREEN)
Card #3 Fecal Occult Blood, POC: NEGATIVE
FECAL OCCULT BLD: NEGATIVE
FECAL OCCULT BLD: NEGATIVE

## 2017-03-05 LAB — CBC WITH DIFFERENTIAL/PLATELET
BASOS PCT: 0.8 %
Basophils Absolute: 57 cells/uL (ref 0–200)
EOS PCT: 1.8 %
Eosinophils Absolute: 128 cells/uL (ref 15–500)
HCT: 42.9 % (ref 38.5–50.0)
Hemoglobin: 14.5 g/dL (ref 13.2–17.1)
Lymphs Abs: 944 cells/uL (ref 850–3900)
MCH: 30 pg (ref 27.0–33.0)
MCHC: 33.8 g/dL (ref 32.0–36.0)
MCV: 88.8 fL (ref 80.0–100.0)
MONOS PCT: 7.3 %
MPV: 10 fL (ref 7.5–12.5)
NEUTROS PCT: 76.8 %
Neutro Abs: 5453 cells/uL (ref 1500–7800)
PLATELETS: 145 10*3/uL (ref 140–400)
RBC: 4.83 10*6/uL (ref 4.20–5.80)
RDW: 14.2 % (ref 11.0–15.0)
TOTAL LYMPHOCYTE: 13.3 %
WBC: 7.1 10*3/uL (ref 3.8–10.8)
WBCMIX: 518 {cells}/uL (ref 200–950)

## 2017-03-05 LAB — PROTIME-INR
INR: 2.5 — ABNORMAL HIGH
Prothrombin Time: 25.8 s — ABNORMAL HIGH (ref 9.0–11.5)

## 2017-03-06 DIAGNOSIS — Z1211 Encounter for screening for malignant neoplasm of colon: Secondary | ICD-10-CM

## 2017-03-07 ENCOUNTER — Encounter: Payer: Self-pay | Admitting: Adult Health

## 2017-03-10 ENCOUNTER — Telehealth (INDEPENDENT_AMBULATORY_CARE_PROVIDER_SITE_OTHER): Payer: Self-pay | Admitting: Orthopaedic Surgery

## 2017-03-10 NOTE — Telephone Encounter (Signed)
Is pain related to ankle injury from 6 weeks ago or new problem?

## 2017-03-10 NOTE — Telephone Encounter (Signed)
New problem, appt on Wednesday

## 2017-03-10 NOTE — Telephone Encounter (Signed)
I schedule the patient with Aaron Edelman on Wednesday, but she would like to know what they can do for pain in the meantime.  Thank you.

## 2017-03-10 NOTE — Telephone Encounter (Signed)
Patient's wife called this morning stating that her husband hurt his left leg and ankle and has been treating this for two weeks without any improvement.  She wanted to know if Dr. Durward Fortes could see him today.  She also states that it feels like pins and needles in his leg.  CB#[phone].  Thank you.

## 2017-03-10 NOTE — Telephone Encounter (Signed)
Please advise 

## 2017-03-12 ENCOUNTER — Ambulatory Visit (INDEPENDENT_AMBULATORY_CARE_PROVIDER_SITE_OTHER): Payer: Medicare Other | Admitting: Orthopedic Surgery

## 2017-03-12 NOTE — Telephone Encounter (Signed)
error 

## 2017-03-26 ENCOUNTER — Encounter (INDEPENDENT_AMBULATORY_CARE_PROVIDER_SITE_OTHER): Payer: Self-pay

## 2017-04-03 ENCOUNTER — Ambulatory Visit (INDEPENDENT_AMBULATORY_CARE_PROVIDER_SITE_OTHER): Payer: Medicare Other | Admitting: Physician Assistant

## 2017-04-03 ENCOUNTER — Encounter: Payer: Self-pay | Admitting: Physician Assistant

## 2017-04-03 VITALS — BP 122/84 | HR 61 | Temp 97.6°F | Resp 14 | Ht 67.5 in | Wt 194.0 lb

## 2017-04-03 DIAGNOSIS — G250 Essential tremor: Secondary | ICD-10-CM

## 2017-04-03 DIAGNOSIS — I10 Essential (primary) hypertension: Secondary | ICD-10-CM

## 2017-04-03 DIAGNOSIS — I5022 Chronic systolic (congestive) heart failure: Secondary | ICD-10-CM

## 2017-04-03 DIAGNOSIS — I25708 Atherosclerosis of coronary artery bypass graft(s), unspecified, with other forms of angina pectoris: Secondary | ICD-10-CM

## 2017-04-03 DIAGNOSIS — N1831 Chronic kidney disease, stage 3a: Secondary | ICD-10-CM

## 2017-04-03 DIAGNOSIS — I480 Paroxysmal atrial fibrillation: Secondary | ICD-10-CM

## 2017-04-03 DIAGNOSIS — D696 Thrombocytopenia, unspecified: Secondary | ICD-10-CM | POA: Diagnosis not present

## 2017-04-03 DIAGNOSIS — F330 Major depressive disorder, recurrent, mild: Secondary | ICD-10-CM | POA: Diagnosis not present

## 2017-04-03 DIAGNOSIS — Z79899 Other long term (current) drug therapy: Secondary | ICD-10-CM

## 2017-04-03 DIAGNOSIS — N028 Recurrent and persistent hematuria with other morphologic changes: Secondary | ICD-10-CM

## 2017-04-03 DIAGNOSIS — Z0001 Encounter for general adult medical examination with abnormal findings: Secondary | ICD-10-CM | POA: Diagnosis not present

## 2017-04-03 DIAGNOSIS — E782 Mixed hyperlipidemia: Secondary | ICD-10-CM

## 2017-04-03 DIAGNOSIS — B399 Histoplasmosis, unspecified: Secondary | ICD-10-CM

## 2017-04-03 DIAGNOSIS — N183 Chronic kidney disease, stage 3 (moderate): Secondary | ICD-10-CM | POA: Diagnosis not present

## 2017-04-03 DIAGNOSIS — E1122 Type 2 diabetes mellitus with diabetic chronic kidney disease: Secondary | ICD-10-CM

## 2017-04-03 DIAGNOSIS — R6889 Other general symptoms and signs: Secondary | ICD-10-CM

## 2017-04-03 DIAGNOSIS — Z940 Kidney transplant status: Secondary | ICD-10-CM | POA: Diagnosis not present

## 2017-04-03 DIAGNOSIS — Z0000 Encounter for general adult medical examination without abnormal findings: Secondary | ICD-10-CM

## 2017-04-03 DIAGNOSIS — Z6829 Body mass index (BMI) 29.0-29.9, adult: Secondary | ICD-10-CM

## 2017-04-03 DIAGNOSIS — M1A3790 Chronic gout due to renal impairment, unspecified ankle and foot, without tophus (tophi): Secondary | ICD-10-CM

## 2017-04-03 DIAGNOSIS — N02B9 Other recurrent and persistent immunoglobulin A nephropathy: Secondary | ICD-10-CM

## 2017-04-03 DIAGNOSIS — E559 Vitamin D deficiency, unspecified: Secondary | ICD-10-CM

## 2017-04-03 DIAGNOSIS — G4733 Obstructive sleep apnea (adult) (pediatric): Secondary | ICD-10-CM

## 2017-04-03 DIAGNOSIS — N182 Chronic kidney disease, stage 2 (mild): Secondary | ICD-10-CM

## 2017-04-03 DIAGNOSIS — Z7901 Long term (current) use of anticoagulants: Secondary | ICD-10-CM

## 2017-04-03 MED ORDER — CLONAZEPAM 0.5 MG PO TABS: 0.5000 mg | ORAL_TABLET | Freq: Three times a day (TID) | ORAL | 0 refills | Status: DC | PRN

## 2017-04-03 NOTE — Patient Instructions (Addendum)
Check with nephrology about NEW shingles vaccine and if you can get that since not a live virus  Stop the diazepam and start the klonopin for tremor, if still with depression message me  Magnesium low add 400-500 mg with food to prevent diarrhea. Magnesium may help with muscle cramps, constipation, vitamin D and potassium absorption.   About Constipation  Constipation Overview Constipation is the most common gastrointestinal complaint - about 4 million Americans experience constipation and make 2.5 million physician visits a year to get help for the problem.  Constipation can occur when the colon absorbs too much water, the colon's muscle contraction is slow or sluggish, and/or there is delayed transit time through the colon.  The result is stool that is hard and dry.  Indicators of constipation include straining during bowel movements greater than 25% of the time, having fewer than three bowel movements per week, and/or the feeling of incomplete evacuation.  There are established guidelines ([place] II ) for defining constipation. A person needs to have two or more of the following symptoms for at least 12 weeks (not necessarily consecutive) in the preceding 12 months: . Straining in  greater than 25% of bowel movements . Lumpy or hard stools in greater than 25% of bowel movements . Sensation of incomplete emptying in greater than 25% of bowel movements . Sensation of anorectal obstruction/blockade in greater than 25% of bowel movements . Manual maneuvers to help empty greater than 25% of bowel movements (e.g., digital evacuation, support of the pelvic floor)  . Less than  3 bowel movements/week . Loose stools are not present, and criteria for irritable bowel syndrome are insufficient  Common Causes of Constipation . Lack of fiber in your diet . Lack of physical activity . Medications, including iron and calcium supplements  . Dairy intake . Dehydration . Abuse of  laxatives  Travel  Irritable Bowel Syndrome  Pregnancy  Luteal phase of menstruation (after ovulation and before menses)  Colorectal problems  Intestinal Dysfunction  Treating Constipation  There are several ways of treating constipation, including changes to diet and exercise, use of laxatives, adjustments to the pelvic floor, and scheduled toileting.  These treatments include: . increasing fiber and fluids in the diet  . increasing physical activity . learning muscle coordination   learning proper toileting techniques and toileting modifications   designing and sticking  to a toileting schedule     2007, [employer] Doc.22   Plantar Fasciitis Plantar fasciitis is a painful foot condition that affects the heel. It occurs when the band of tissue that connects the toes to the heel bone (plantar fascia) becomes irritated. This can happen after exercising too much or doing other repetitive activities (overuse injury). The pain from plantar fasciitis can range from mild irritation to severe pain that makes it difficult for you to walk or move. The pain is usually worse in the morning or after you have been sitting or lying down for a while. What are the causes? This condition may be caused by:  Standing for long periods of time.  Wearing shoes that do not fit.  Doing high-impact activities, including running, aerobics, and ballet.  Being overweight.  Having an abnormal way of walking (gait).  Having tight calf muscles.  Having high arches in your feet.  Starting a new athletic activity.  What are the signs or symptoms? The main symptom of this condition is heel pain. Other symptoms include:  Pain that gets worse after activity or exercise.  Pain that is worse in the morning or after resting.  Pain that goes away after you walk for a few minutes.  How is this diagnosed? This condition may be diagnosed based on your signs and symptoms. Your health  care provider will also do a physical exam to check for:  A tender area on the bottom of your foot.  A high arch in your foot.  Pain when you move your foot.  Difficulty moving your foot.  You may also need to have imaging studies to confirm the diagnosis. These can include:  X-rays.  Ultrasound.  MRI.  How is this treated? Treatment for plantar fasciitis depends on the severity of the condition. Your treatment may include:  Rest, ice, and over-the-counter pain medicines to manage your pain.  Exercises to stretch your calves and your plantar fascia.  A splint that holds your foot in a stretched, upward position while you sleep (night splint).  Physical therapy to relieve symptoms and prevent problems in the future.  Cortisone injections to relieve severe pain.  Extracorporeal shock wave therapy (ESWT) to stimulate damaged plantar fascia with electrical impulses. It is often used as a last resort before surgery.  Surgery, if other treatments have not worked after 12 months.  Follow these instructions at home:  Take medicines only as directed by your health care provider.  Avoid activities that cause pain.  Roll the bottom of your foot over a bag of ice or a bottle of cold water. Do this for 20 minutes, 3-4 times a day.  Perform simple stretches as directed by your health care provider.  Try wearing athletic shoes with air-sole or gel-sole cushions or soft shoe inserts.  Wear a night splint while sleeping, if directed by your health care provider.  Keep all follow-up appointments with your health care provider. How is this prevented?  Do not perform exercises or activities that cause heel pain.  Consider finding low-impact activities if you continue to have problems.  Lose weight if you need to. The best way to prevent plantar fasciitis is to avoid the activities that aggravate your plantar fascia. Contact a health care provider if:  Your symptoms do not go  away after treatment with home care measures.  Your pain gets worse.  Your pain affects your ability to move or do your daily activities. This information is not intended to replace advice given to you by your health care provider. Make sure you discuss any questions you have with your health care provider. Document Released: 12/18/2000 Document Revised: 08/28/2015 Document Reviewed: 02/02/2014 Elsevier Interactive Patient Education  2018 [place]   Tarsal Tunnel Syndrome Rehab Ask your health care provider which exercises are safe for you. Do exercises exactly as told by your health care provider and adjust them as directed. It is normal to feel mild stretching, pulling, tightness, or discomfort as you do these exercises, but you should stop right away if you feel sudden pain or your pain gets worse. Do not begin these exercises until told by your health care provider. Stretching and range of motion exercises These exercises warm up your muscles and joints and improve the movement and flexibility of your foot. These exercises also help to relieve pain, numbness, and tingling. Exercise A: Gastrocnemius, standing  1. Stand with your hands against a wall. 2. Extend your left / right leg behind you, and bend your front knee slightly. 3. Keep your left / right heel on the floor and keep your back knee  straight as you shift your weight toward the wall. Do this without arching your back. You should feel a gentle stretch in your back calf. 4. Hold this position for __________ seconds. 5. Return to the starting position. Repeat __________ times. Complete this exercise __________ times per day. Exercise B: Tibial nerve glide 1. Sit on a stable chair with both feet on the floor. 2. Clasp your hands together behind your back. Gently round your back and tuck your chin toward your chest. 3. Position your left / right foot so your toes are tipped up toward your shin and aimed out to the  side. 4. Slowly straighten your knee as far as you can without increasing your symptoms. 5. Hold this position for __________ seconds. 6. Slowly bend your left / right knee to return to the starting position. Repeat __________ times. Complete this exercise __________ times per day. Strengthening exercises These exercises build strength and endurance in your foot. Endurance is the ability to use your muscles for a long time, even after they get tired. Exercise C: Plantar flexors  1. Sit on the floor with your left / right leg straight out in front of you. 2. Loop a rubber exercise band around the ball of your left / right foot. The ball of your foot is on the walking surface, right under your toes. Hold the ends of the band in your hands. 3. Slowly point your left / right toes downward, pushing them away from you. 4. Hold this position for __________ seconds. 5. Slowly return to the starting position. Repeat __________ times. Complete this exercise __________ times per day. Exercise D: Ankle inversion 1. Sit on the floor with your legs straight out in front of you. 2. Loop a rubber exercise band around the ball of your left / right foot. The ball of your foot is on the walking surface, right under your toes. Hold the ends of the band in your hands or secure the band to a stable object. 3. Slowly push your foot inward, toward your other leg. 4. Hold this position for __________ seconds. 5. Slowly return to the starting position. Repeat __________ times. Complete this exercise __________ times per day. Exercise E: Arch lifts ( foot intrinsics) 1. Sit in a chair with your feet flat on the floor. 2. Keeping your big toe and your heel on the floor, lift only your arch, which is on the inner edge of your left / right foot. Do not move your knee or scrunch your toes. This is a small movement. 3. Hold this position for __________ seconds. 4. Slowly return to the starting position. Repeat  __________ times. Complete this exercise __________ times per day. This information is not intended to replace advice given to you by your health care provider. Make sure you discuss any questions you have with your health care provider. Document Released: 03/25/2005 Document Revised: 11/29/2015 Document Reviewed: 03/07/2015 Elsevier Interactive Patient Education  [employer].
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
## 2017-04-03 NOTE — Progress Notes (Signed)
Patient's Rx has been called into the pharmacy on Dec 27th 2018 @ 10:58am by DD

## 2017-04-04 ENCOUNTER — Encounter: Payer: Self-pay | Admitting: Physician Assistant

## 2017-04-04 LAB — CBC WITH DIFFERENTIAL/PLATELET
BASOS PCT: 1 %
Basophils Absolute: 49 cells/uL (ref 0–200)
Eosinophils Absolute: 88 cells/uL (ref 15–500)
Eosinophils Relative: 1.8 %
HEMATOCRIT: 42.3 % (ref 38.5–50.0)
Hemoglobin: 14.4 g/dL (ref 13.2–17.1)
LYMPHS ABS: 926 {cells}/uL (ref 850–3900)
MCH: 30.8 pg (ref 27.0–33.0)
MCHC: 34 g/dL (ref 32.0–36.0)
MCV: 90.6 fL (ref 80.0–100.0)
MPV: 9.5 fL (ref 7.5–12.5)
Monocytes Relative: 7.1 %
NEUTROS ABS: 3489 {cells}/uL (ref 1500–7800)
NEUTROS PCT: 71.2 %
Platelets: 147 10*3/uL (ref 140–400)
RBC: 4.67 10*6/uL (ref 4.20–5.80)
RDW: 13.9 % (ref 11.0–15.0)
Total Lymphocyte: 18.9 %
WBC mixed population: 348 cells/uL (ref 200–950)
WBC: 4.9 10*3/uL (ref 3.8–10.8)

## 2017-04-04 LAB — HEPATIC FUNCTION PANEL
AG Ratio: 2 (calc) (ref 1.0–2.5)
ALT: 16 U/L (ref 9–46)
AST: 22 U/L (ref 10–35)
Albumin: 4.1 g/dL (ref 3.6–5.1)
Alkaline phosphatase (APISO): 91 U/L (ref 40–115)
BILIRUBIN INDIRECT: 0.9 mg/dL (ref 0.2–1.2)
Bilirubin, Direct: 0.3 mg/dL — ABNORMAL HIGH (ref 0.0–0.2)
Globulin: 2.1 g/dL (calc) (ref 1.9–3.7)
TOTAL PROTEIN: 6.2 g/dL (ref 6.1–8.1)
Total Bilirubin: 1.2 mg/dL (ref 0.2–1.2)

## 2017-04-04 LAB — BASIC METABOLIC PANEL WITH GFR
BUN: 14 mg/dL (ref 7–25)
CHLORIDE: 103 mmol/L (ref 98–110)
CO2: 30 mmol/L (ref 20–32)
Calcium: 10 mg/dL (ref 8.6–10.3)
Creat: 1.02 mg/dL (ref 0.70–1.25)
GFR, Est African American: 87 mL/min/{1.73_m2} (ref >=60)
GFR, Est Non African American: 75 mL/min/{1.73_m2} (ref >=60)
GLUCOSE: 106 mg/dL — AB (ref 65–99)
Potassium: 4.3 mmol/L (ref 3.5–5.3)
SODIUM: 141 mmol/L (ref 135–146)

## 2017-04-04 LAB — TSH: TSH: 1.83 m[IU]/L (ref 0.40–4.50)

## 2017-04-04 LAB — PROTIME-INR
INR: 1.5 — ABNORMAL HIGH
PROTHROMBIN TIME: 16.3 s — AB (ref 9.0–11.5)

## 2017-04-07 ENCOUNTER — Other Ambulatory Visit: Payer: Self-pay | Admitting: Internal Medicine

## 2017-04-14 ENCOUNTER — Other Ambulatory Visit: Payer: Self-pay | Admitting: Internal Medicine

## 2017-04-28 ENCOUNTER — Other Ambulatory Visit: Payer: Self-pay | Admitting: Physician Assistant

## 2017-04-30 ENCOUNTER — Encounter: Payer: Self-pay | Admitting: Physician Assistant

## 2017-05-02 ENCOUNTER — Ambulatory Visit (INDEPENDENT_AMBULATORY_CARE_PROVIDER_SITE_OTHER): Payer: Medicare Other | Admitting: Internal Medicine

## 2017-05-02 VITALS — BP 122/64 | HR 64 | Temp 97.7°F | Resp 18 | Ht 67.5 in | Wt 192.4 lb

## 2017-05-02 DIAGNOSIS — I10 Essential (primary) hypertension: Secondary | ICD-10-CM

## 2017-05-02 DIAGNOSIS — Z7901 Long term (current) use of anticoagulants: Secondary | ICD-10-CM

## 2017-05-02 DIAGNOSIS — E559 Vitamin D deficiency, unspecified: Secondary | ICD-10-CM | POA: Diagnosis not present

## 2017-05-02 DIAGNOSIS — I480 Paroxysmal atrial fibrillation: Secondary | ICD-10-CM | POA: Diagnosis not present

## 2017-05-02 DIAGNOSIS — M1030 Gout due to renal impairment, unspecified site: Secondary | ICD-10-CM

## 2017-05-02 DIAGNOSIS — E1122 Type 2 diabetes mellitus with diabetic chronic kidney disease: Secondary | ICD-10-CM | POA: Diagnosis not present

## 2017-05-02 DIAGNOSIS — E782 Mixed hyperlipidemia: Secondary | ICD-10-CM | POA: Diagnosis not present

## 2017-05-02 DIAGNOSIS — Z79899 Other long term (current) drug therapy: Secondary | ICD-10-CM | POA: Diagnosis not present

## 2017-05-02 DIAGNOSIS — Z940 Kidney transplant status: Secondary | ICD-10-CM | POA: Diagnosis not present

## 2017-05-02 DIAGNOSIS — N182 Chronic kidney disease, stage 2 (mild): Secondary | ICD-10-CM

## 2017-05-02 NOTE — Patient Instructions (Signed)
Bleeding Precautions When on Anticoagulant Therapy  WHAT IS ANTICOAGULANT THERAPY? Anticoagulant therapy is taking medicine to prevent or reduce blood clots. It is also called blood thinner therapy. Blood clots that form in your blood vessels can be dangerous. They can break loose and travel to your heart, lungs, or brain. This increases your risk of a heart attack or stroke. Anticoagulant therapy causes blood to clot more slowly. You may need anticoagulant therapy if you have:  A medical condition that increases the likelihood that blood clots will form.  A heart defect or a problem with heart rhythm. It is also a common treatment after heart surgery, such as valve replacement. WHAT ARE COMMON TYPES OF ANTICOAGULANT THERAPY? Anticoagulant medicine can be injected or taken by mouth.If you need anticoagulant therapy quickly at the hospital, the medicine may be injected under your skin or given through an IV tube. Heparin is a common example of an anticoagulant that you may get at the hospital. Most anticoagulant therapy is in the form of pills that you take at home every day. These may include:  Aspirin. This common blood thinner works by preventing blood cells (platelets) from sticking together to form a clot. Aspirin is not as strong as anticoagulants that slow down the time that it takes for your body to form a clot.  Clopidogrel. This is a newer type of drug that affects platelets. It is stronger than aspirin.  Warfarin. This is the most common anticoagulant. It changes the way your body uses vitamin K, a vitamin that helps your blood to clot. The risk of bleeding is higher with warfarin than with aspirin. You will need frequent blood tests to make sure you are taking the safest amount.  New anticoagulants. Several new drugs have been approved. They are all taken by mouth. Studies show that these drugs work as well as warfarin. They do not require blood testing. They may cause less bleeding  risk than warfarin. WHAT DO I NEED TO REMEMBER WHEN TAKING ANTICOAGULANT THERAPY? Anticoagulant therapy decreases your risk of forming a blood clot, but it increases your risk of bleeding. Work closely with your health care provider to make sure you are taking your medicine safely. These tips can help:  Learn ways to reduce your risk of bleeding.  If you are taking warfarin: ? Have blood tests as ordered by your health care provider. ? Do not make any sudden changes to your diet. Vitamin K in your diet can make warfarin less effective. ? Do not get pregnant. This medicine may cause birth defects.  Take your medicine at the same time every day. If you forget to take your medicine, take it as soon as you remember. If you miss a whole day, do not double your dose of medicine. Take your normal dose and call your health care provider to check in.  Do not stop taking your medicine on your own.  Tell your health care provider before you start taking any new medicine, vitamin, or herbal product. Some of these could interfere with your therapy.  Tell all of your health care providers that you are on anticoagulant therapy.  Do not have surgery, medical procedures, or dental work until you tell your health care provider that you are on anticoagulant therapy. WHAT CAN AFFECT HOW ANTICOAGULANTS WORK? Certain foods, vitamins, medicines, supplements, and herbal medicines change the way that anticoagulant therapy works. They may increase or decrease the effects of your anticoagulant therapy. Either result can be dangerous for you.  Many over-the-counter medicines for pain, colds, or stomach problems interfere with anticoagulant therapy. Take these only as told by your health care provider.  Do not drink alcohol. It can interfere with your medicine and increase your risk of an injury that causes bleeding.  If you are taking warfarin, do not begin eating more foods that contain vitamin K. These include  leafy green vegetables. Ask your health care provider if you should avoid any foods. WHAT ARE SOME WAYS TO PREVENT BLEEDING? You can prevent bleeding by taking certain precautions:  Be extra careful when you use knives, scissors, or other sharp objects.  Use an electric razor instead of a blade.  Do not use toothpicks.  Use a soft toothbrush.  Wear shoes that have nonskid soles.  Use bath mats and handrails in your bathroom.  Wear gloves while you do yard work.  Wear a helmet when you ride a bike.  Wear your seat belt.  Prevent falls by removing loose rugs and extension cords from areas where you walk.  Do not play contact sports or participate in other activities that have a high risk of injury. WHEN SHOULD I CONTACT MY HEALTH CARE PROVIDER? Call your health care provider if:  You miss a dose of medicine: ? And you are not sure what to do. ? For more than one day.  You have: ? Menstrual bleeding that is heavier than normal. ? Blood in your urine. ? A bloody nose or bleeding gums. ? Easy bruising. ? Blood in your stool (feces) or have black and tarry stool. ? Side effects from your medicine.  You feel weak or dizzy.  You become pregnant. Seek immediate medical care if:  You have bleeding that will not stop.  You have sudden and severe headache or belly pain.  You vomit or you cough up bright red blood.  You have a severe blow to your head. WHAT ARE SOME QUESTIONS TO ASK MY HEALTH CARE PROVIDER?  What is the best anticoagulant therapy for my condition?  What side effects should I watch for?  When should I take my medicine? What should I do if I forget to take it?  Will I need to have regular blood tests?  Do I need to change my diet? Are there foods or drinks that I should avoid?  What activities are safe for me?  What should I do if I want to get pregnant? This information is not intended to replace advice given to you by your health care provider.  Make sure you discuss any questions you have with your health care provider. Document Released: 03/06/2015 Document Reviewed: 03/06/2015 Elsevier Interactive Patient Education  2017 [employer]  ++++++++++++++++++++++++++++++++++ Recommend Adult Low Dose Aspirin or  coated  Aspirin 81 mg daily  To reduce risk of Colon Cancer 20 %,  Skin Cancer 26 % ,  Melanoma 46%  and  Pancreatic cancer 60% +++++++++++++++++++++++++ Vitamin D goal  is between 70-100.  Please make sure that you are taking your Vitamin D as directed.  It is very important as a natural anti-inflammatory  helping hair, skin, and nails, as well as reducing stroke and heart attack risk.  It helps your bones and helps with mood. It also decreases numerous cancer risks so please take it as directed.  Low Vit D is associated with a 200-300% higher risk for CANCER  and 200-300% higher risk for HEART   ATTACK  &  STROKE.   ...................................... It is also associated with   higher death rate at younger ages,  autoimmune diseases like Rheumatoid arthritis, Lupus, Multiple Sclerosis.    Also many other serious conditions, like depression, Alzheimer's Dementia, infertility, muscle aches, fatigue, fibromyalgia - just to name a few. ++++++++++++++++++++ Recommend the book "The END of DIETING" by Dr Joel Fuhrman  & the book "The END of DIABETES " by Dr Joel Fuhrman At [employer] - get book & Audio CD's    Being diabetic has a  300% increased risk for heart attack, stroke, cancer, and alzheimer- type vascular dementia. It is very important that you work harder with diet by avoiding all foods that are white. Avoid white rice (brown & wild rice is OK), white potatoes (sweetpotatoes in moderation is OK), White bread or wheat bread or anything made out of white flour like bagels, donuts, rolls, buns, biscuits, cakes, pastries, cookies, pizza crust, and pasta (made from white flour & egg whites) - vegetarian pasta or  spinach or wheat pasta is OK. Multigrain breads like Arnold's or Pepper[employer], or multigrain sandwich thins or flatbreads.  Diet, exercise and weight loss can reverse and cure diabetes in the early stages.  Diet, exercise and weight loss is very important in the control and prevention of complications of diabetes which affects every system in your body, ie. Brain - dementia/stroke, eyes - glaucoma/blindness, heart - heart attack/heart failure, kidneys - dialysis, stomach - gastric paralysis, intestines - malabsorption, nerves - severe painful neuritis, circulation - gangrene & loss of a leg(s), and finally cancer and Alzheimers.    I recommend avoid fried & greasy foods,  sweets/candy, white rice (brown or wild rice or Quinoa is OK), white potatoes (sweet potatoes are OK) - anything made from white flour - bagels, doughnuts, rolls, buns, biscuits,white and wheat breads, pizza crust and traditional pasta made of white flour & egg white(vegetarian pasta or spinach or wheat pasta is OK).  Multi-grain bread is OK - like multi-grain flat bread or sandwich thins. Avoid alcohol in excess. Exercise is also important.    Eat all the vegetables you want - avoid meat, especially red meat and dairy - especially cheese.  Cheese is the most concentrated form of trans-fats which is the worst thing to clog up our arteries. Veggie cheese is OK which can be found in the fresh produce section at [employer] or [employer] or [employer]  +++++++++++++++++++++ DASH Eating Plan  DASH stands for "Dietary Approaches to Stop Hypertension."   The DASH eating plan is a healthy eating plan that has been shown to reduce high blood pressure (hypertension). Additional health benefits may include reducing the risk of type 2 diabetes mellitus, heart disease, and stroke. The DASH eating plan may also help with weight loss. WHAT DO I NEED TO KNOW ABOUT THE DASH EATING PLAN? For the DASH eating plan, you will follow these general  guidelines:  Choose foods with a percent daily value for sodium of less than 5% (as listed on the food label).  Use salt-free seasonings or herbs instead of table salt or sea salt.  Check with your health care provider or pharmacist before using salt substitutes.  Eat lower-sodium products, often labeled as "lower sodium" or "no salt added."  Eat fresh foods.  Eat more vegetables, fruits, and low-fat dairy products.  Choose whole grains. Look for the word "whole" as the first word in the ingredient list.  Choose fish   Limit sweets, desserts, sugars, and sugary drinks.  Choose heart-healthy fats.  Eat veggie cheese     Eat more home-cooked food and less restaurant, buffet, and fast food.  Limit fried foods.  Cook foods using methods other than frying.  Limit canned vegetables. If you do use them, rinse them well to decrease the sodium.  When eating at a restaurant, ask that your food be prepared with less salt, or no salt if possible.                      WHAT FOODS CAN I EAT? Read Dr Joel Fuhrman's books on The End of Dieting & The End of Diabetes  Grains Whole grain or whole wheat bread. Brown rice. Whole grain or whole wheat pasta. Quinoa, bulgur, and whole grain cereals. Low-sodium cereals. Corn or whole wheat flour tortillas. Whole grain cornbread. Whole grain crackers. Low-sodium crackers.  Vegetables Fresh or frozen vegetables (raw, steamed, roasted, or grilled). Low-sodium or reduced-sodium tomato and vegetable juices. Low-sodium or reduced-sodium tomato sauce and paste. Low-sodium or reduced-sodium canned vegetables.   Fruits All fresh, canned (in natural juice), or frozen fruits.  Protein Products  All fish and seafood.  Dried beans, peas, or lentils. Unsalted nuts and seeds. Unsalted canned beans.  Dairy Low-fat dairy products, such as skim or 1% milk, 2% or reduced-fat cheeses, low-fat ricotta or cottage cheese, or plain low-fat yogurt. Low-sodium or  reduced-sodium cheeses.  Fats and Oils Tub margarines without trans fats. Light or reduced-fat mayonnaise and salad dressings (reduced sodium). Avocado. Safflower, olive, or canola oils. Natural peanut or almond butter.  Other Unsalted popcorn and pretzels. The items listed above may not be a complete list of recommended foods or beverages. Contact your dietitian for more options.  +++++++++++++++  WHAT FOODS ARE NOT RECOMMENDED? Grains/ White flour or wheat flour White bread. White pasta. White rice. Refined cornbread. Bagels and croissants. Crackers that contain trans fat.  Vegetables  Creamed or fried vegetables. Vegetables in a . Regular canned vegetables. Regular canned tomato sauce and paste. Regular tomato and vegetable juices.  Fruits Dried fruits. Canned fruit in light or heavy syrup. Fruit juice.  Meat and Other Protein Products Meat in general - RED meat & White meat.  Fatty cuts of meat. Ribs, chicken wings, all processed meats as bacon, sausage, bologna, salami, fatback, hot dogs, bratwurst and packaged luncheon meats.  Dairy Whole or 2% milk, cream, half-and-half, and cream cheese. Whole-fat or sweetened yogurt. Full-fat cheeses or blue cheese. Non-dairy creamers and whipped toppings. Processed cheese, cheese spreads, or cheese curds.  Condiments Onion and garlic salt, seasoned salt, table salt, and sea salt. Canned and packaged gravies. Worcestershire sauce. Tartar sauce. Barbecue sauce. Teriyaki sauce. Soy sauce, including reduced sodium. Steak sauce. Fish sauce. Oyster sauce. Cocktail sauce. Horseradish. Ketchup and mustard. Meat flavorings and tenderizers. Bouillon cubes. Hot sauce. Tabasco sauce. Marinades. Taco seasonings. Relishes.  Fats and Oils Butter, stick margarine, lard, shortening and bacon fat. Coconut, palm kernel, or palm oils. Regular salad dressings.  Pickles and olives. Salted popcorn and pretzels.  The items listed above may not be a complete  list of foods and beverages to avoid.   

## 2017-05-02 NOTE — Progress Notes (Signed)
This very nice [name] presents for 3 month follow up with Hypertension, ASCAD/CABG,  Hyperlipidemia, T2_IDDMand Vitamin D Deficiency. 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 Patient's Gout is controlled on current meds.       Patient is treated for HTN (1998) & BP has been controlled at home. Today's BP is at goal - 122/64.  Last GFR (75) stable in Dec. Patient had an acute MI post Renal transplant in 09/2011 and in 02/2012 he underwent CABG with hx/o post -op pAfib and has been on Coumadin since. Patient has had no complaints of any cardiac type chest pain, palpitations, dyspnea / orthopnea / PND, dizziness, claudication, or dependent edema. Patient has cAfib and is on Coumadin  And dose was recently increased w/o any c/o excessive bruising/bleeding.  Lab Results  Component Value Date   INR 1.5 (H) 04/03/2017   INR 2.5 (H) 03/04/2017   INR 3.0 (H) 02/10/2017       Hyperlipidemia is controlled with diet & meds. Patient denies myalgias or other med SE's. Last Lipids were  Lab Results  Component Value Date   CHOL 164 01/31/2017   HDL 57 01/31/2017   LDLCALC 75 09/09/2016   TRIG 151 (H) 01/31/2017   CHOLHDL 2.9 01/31/2017      Also, the patient has history of Ins req T2_DM (2013) managed initially w/diet till starting Metformin in 2007 and then transitioning to Insulin after his renal transplant in 09/2011.  He symptoms of reactive hypoglycemia, diabetic polys, paresthesias or visual blurring.  Last A1c was near goal: Lab Results  Component Value Date   HGBA1C 5.9 (H) 01/31/2017      Further, the patient also has history of Vitamin D Deficiency ("24"/2014) and supplements vitamin D without any suspected side-effects. Last vitamin D was at goal:  Lab Results  Component Value Date   VD25OH 93 01/31/2017   Current Outpatient Medications on File Prior to Visit  Medication Sig  . allopurinol (ZYLOPRIM) 300 MG tablet TAKE 1  TABLET BY MOUTH DAILY FOR GOUT PREVENTION  . aspirin EC 81 MG tablet Take 81 mg by mouth daily.  . betamethasone dipropionate (DIPROLENE) 0.05 % cream Apply topically.  . Black Pepper-Turmeric (TURMERIC COMPLEX/BLACK PEPPER PO) Take 1,500 mg by mouth daily.  Marland Kitchen buPROPion (WELLBUTRIN XL) 300 MG 24 hr tablet TAKE ONE TABLET BY MOUTH EVERY MORNING  . Cholecalciferol (VITAMIN D PO) Take 1,000 Int'l Units by mouth daily.   . clonazePAM (KLONOPIN) 0.5 MG tablet TAKE 1 TABLET BY MOUTH THREE TIMES DAILY AS NEEDED FOR TREMOR  . finasteride (PROSCAR) 5 MG tablet TAKE 1 TABLET BY MOUTH DAILY  . glucose blood (FREESTYLE TEST STRIPS) test strip Check blood sugar 3 to 4 times daily for medication regulation.  . insulin NPH-regular Human (NOVOLIN 70/30) (70-30) 100 UNIT/ML injection Take 20-30 units 2 x day or as directed  . latanoprost (XALATAN) 0.005 % ophthalmic solution INT 1 GTT IN EACH EYE QHS  . MAGNESIUM-OXIDE 400 (241.3 Mg) MG tablet TK 1 T PO BID  . mycophenolate (MYFORTIC) 360 MG TBEC EC tablet TAKE 1 TABLET BY MOUTH TWICE DAILY  . tacrolimus (PROGRAF) 0.5 MG capsule take 1 tablet at bedtime.  . tacrolimus (PROGRAF) 1 MG capsule Take 1 mg by mouth 2 (two) times daily.  . traMADol (ULTRAM) 50 MG tablet Take 1 tablet (50 mg total) by mouth every 6 (six) hours as needed.  Marland Kitchen  warfarin (COUMADIN) 5 MG tablet TAKE 1 TO 2 TABLETS BY MOUTH DAILY AS DIRECTED  . atorvastatin (LIPITOR) 80 MG tablet Take 1/2 to 1 tablet daily or as directed for cholesterol   No current facility-administered medications on file prior to visit.    Allergies  Allergen Reactions  . Crestor [Rosuvastatin]     Elevated LFT's  . Lorazepam   . Losartan    PMHx:   Past Medical History:  Diagnosis Date  . Adenomatous colon polyp   . Allergy   . BPH (benign prostatic hyperplasia)   . CHF (congestive heart failure) ([place])   . 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 Diabetes mellitus  type 2, controlled ([place])   . Glaucoma   . Gout   . 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 MI (myocardial infarction) ([place]) 09/26/2011  . NSTEMI (non-ST elevated myocardial infarction) ([place]) 11/26/2013   2013   . OSA (obstructive sleep apnea)   . PAF (paroxysmal atrial fibrillation) ([place])    s/p DC-CV in 6/13. Off coumadin due to ureteral bleed   Immunization History  Administered Date(s) Administered  . DT 06/05/2015  . Hepatitis B 04/08/2009  . Influenza, High Dose Seasonal PF 12/27/2013, 01/17/2015, 01/11/2016  . Influenza-Unspecified 12/25/2016  . Pneumococcal Conjugate-13 10/25/2013  . Pneumococcal Polysaccharide-23 06/05/2015  . Pneumococcal-Unspecified 04/08/2008  . Tdap 04/08/2008   Past Surgical History:  Procedure Laterality Date  . AV FISTULA PLACEMENT  2011   Left forearm  . COLONOSCOPY    . CORONARY ANGIOPLASTY WITH STENT PLACEMENT    . CORONARY ARTERY BYPASS GRAFT  2013  . KIDNEY TRANSPLANT  09/25/2011  . PERITONEAL CATHETER INSERTION  2011   FHx:    Reviewed / unchanged  SHx:    Reviewed / unchanged  Systems Review:  Constitutional: Denies fever, chills, wt changes, headaches, insomnia, fatigue, night sweats, change in appetite. Eyes: Denies redness, blurred vision, diplopia, discharge, itchy, watery eyes.  ENT: Denies discharge, congestion, post nasal drip, epistaxis, sore throat, earache, hearing loss, dental pain, tinnitus, vertigo, sinus pain, snoring.  CV: Denies chest pain, palpitations, irregular heartbeat, syncope, dyspnea, diaphoresis, orthopnea, PND, claudication or edema. Respiratory: denies cough, dyspnea, DOE, pleurisy, hoarseness, laryngitis, wheezing.  Gastrointestinal: Denies dysphagia, odynophagia, heartburn, reflux, water brash, abdominal pain or cramps, nausea, vomiting, bloating, diarrhea, constipation, hematemesis, melena, hematochezia  or hemorrhoids. Genitourinary: Denies dysuria, frequency, urgency, nocturia, hesitancy,  discharge, hematuria or flank pain. Musculoskeletal: Denies arthralgias, myalgias, stiffness, jt. swelling, pain, limping or strain/sprain.  Skin: Denies pruritus, rash, hives, warts, acne, eczema or change in skin lesion(s). Neuro: No weakness, tremor, incoordination, spasms, paresthesia or pain. Psychiatric: Denies confusion, memory loss or sensory loss. Endo: Denies change in weight, skin or hair change.  Heme/Lymph: No excessive bleeding, bruising or enlarged lymph nodes.  Physical Exam  BP 122/64   Pulse 64   Temp 97.7 F (36.5 C)   Resp 18   Ht 5' 7.5" (1.715 m)   Wt 192 lb 6.4 oz (87.3 kg)   BMI 29.69 kg/m   Appears well nourished, well groomed  and in no distress.  Eyes: PERRLA, EOMs, conjunctiva no swelling or erythema. Sinuses: No frontal/maxillary tenderness ENT/Mouth: EAC's clear, TM's nl w/o erythema, bulging. Nares clear w/o erythema, swelling, exudates. Oropharynx clear without erythema or exudates. Oral hygiene is good. Tongue normal, non obstructing. Hearing intact.  Neck: Supple. Thyroid nl. Car 2+/2+ without bruits, nodes or JVD. Chest: Respirations  nl with BS clear & equal w/o rales, rhonchi, wheezing or stridor.  Cor: Heart sounds normal w/ regular rate and rhythm without sig. murmurs, gallops, clicks or rubs. Peripheral pulses normal and equal  without edema.  Abdomen: Soft & bowel sounds normal. Non-tender w/o guarding, rebound, hernias, masses or organomegaly.  Lymphatics: Unremarkable.  Musculoskeletal: Full ROM all peripheral extremities, joint stability, 5/5 strength and normal gait.  Skin: Warm, dry without exposed rashes, lesions or ecchymosis apparent.  Neuro: Cranial nerves intact, reflexes equal bilaterally. Sensory-motor testing grossly intact. Tendon reflexes grossly intact.  Pysch: Alert & oriented x 3.  Insight and judgement nl & appropriate. No ideations.  Assessment and Plan:  1. Essential hypertension  - Continue medication, monitor blood  pressure at home.  - Continue DASH diet. Reminder to go to the ER if any CP,  SOB, nausea, dizziness, severe HA, changes vision/speech.  - CBC with Differential/Platelet - BASIC METABOLIC PANEL WITH GFR - Magnesium  2. Hyperlipidemia, mixed  - Continue diet/meds, exercise,& lifestyle modifications.  - Continue monitor periodic cholesterol/liver & renal functions   - Lipid panel  3. Type 2 diabetes mellitus with stage 2 chronic kidney disease, with current use of insulin (HCC)  - Hemoglobin A1c  4. Vitamin D deficiency  - Continue diet, exercise, lifestyle modifications.  - Monitor appropriate labs. - Continue supplementation. - VITAMIN D 25 Hydroxy   5. Gout due to renal impairment  - Uric acid  6. PAF (paroxysmal atrial fibrillation) ([place])  - Protime-INR  7. Renal Transplant, s/p 07/9199  - BASIC METABOLIC PANEL WITH GFR  8. Long term current use of anticoagulant therapy  - Protime-INR  9. Medication management  - CBC with Differential/Platelet - BASIC METABOLIC PANEL WITH GFR - Magnesium - Lipid panel - Hemoglobin A1c - VITAMIN D 25 Hydroxy - Protime-INR - Uric acid       Discussed  regular exercise, BP monitoring, weight control to achieve/maintain BMI less than 25 and discussed med and SE's. Recommended labs to assess and monitor clinical status with further disposition pending results of labs. Over 30 minutes of exam, counseling, chart review was performed.

## 2017-05-03 LAB — PROTIME-INR
INR: 2.7 — ABNORMAL HIGH
Prothrombin Time: 28.7 s — ABNORMAL HIGH (ref 9.0–11.5)

## 2017-05-03 LAB — BASIC METABOLIC PANEL WITH GFR
BUN: 20 mg/dL (ref 7–25)
CO2: 26 mmol/L (ref 20–32)
CREATININE: 0.99 mg/dL (ref 0.70–1.25)
Calcium: 10 mg/dL (ref 8.6–10.3)
Chloride: 104 mmol/L (ref 98–110)
GFR, EST AFRICAN AMERICAN: 90 mL/min/{1.73_m2} (ref >=60)
GFR, Est Non African American: 77 mL/min/{1.73_m2} (ref >=60)
Glucose, Bld: 164 mg/dL — ABNORMAL HIGH (ref 65–99)
Potassium: 4.1 mmol/L (ref 3.5–5.3)
Sodium: 139 mmol/L (ref 135–146)

## 2017-05-03 LAB — CBC WITH DIFFERENTIAL/PLATELET
Basophils Absolute: 52 cells/uL (ref 0–200)
Basophils Relative: 1.2 %
EOS ABS: 82 {cells}/uL (ref 15–500)
Eosinophils Relative: 1.9 %
HCT: 41.7 % (ref 38.5–50.0)
HEMOGLOBIN: 14.1 g/dL (ref 13.2–17.1)
Lymphs Abs: 1075 cells/uL (ref 850–3900)
MCH: 30.1 pg (ref 27.0–33.0)
MCHC: 33.8 g/dL (ref 32.0–36.0)
MCV: 89.1 fL (ref 80.0–100.0)
MPV: 9.7 fL (ref 7.5–12.5)
Monocytes Relative: 8.6 %
NEUTROS ABS: 2722 {cells}/uL (ref 1500–7800)
Neutrophils Relative %: 63.3 %
Platelets: 133 10*3/uL — ABNORMAL LOW (ref 140–400)
RBC: 4.68 10*6/uL (ref 4.20–5.80)
RDW: 13 % (ref 11.0–15.0)
Total Lymphocyte: 25 %
WBC: 4.3 10*3/uL (ref 3.8–10.8)
WBCMIX: 370 {cells}/uL (ref 200–950)

## 2017-05-03 LAB — HEMOGLOBIN A1C
HEMOGLOBIN A1C: 5.7 %{Hb} — AB (ref <5.7)
MEAN PLASMA GLUCOSE: 117 (calc)
eAG (mmol/L): 6.5 (calc)

## 2017-05-03 LAB — LIPID PANEL
CHOL/HDL RATIO: 2.3 (calc) (ref <5.0)
Cholesterol: 147 mg/dL (ref <200)
HDL: 64 mg/dL (ref >40)
LDL CHOLESTEROL (CALC): 69 mg/dL
NON-HDL CHOLESTEROL (CALC): 83 mg/dL (ref <130)
TRIGLYCERIDES: 68 mg/dL (ref <150)

## 2017-05-03 LAB — VITAMIN D 25 HYDROXY (VIT D DEFICIENCY, FRACTURES): Vit D, 25-Hydroxy: 109 ng/mL — ABNORMAL HIGH (ref 30–100)

## 2017-05-03 LAB — MAGNESIUM: MAGNESIUM: 1.6 mg/dL (ref 1.5–2.5)

## 2017-05-03 LAB — URIC ACID: URIC ACID, SERUM: 3.8 mg/dL — AB (ref 4.0–8.0)

## 2017-05-04 ENCOUNTER — Encounter (INDEPENDENT_AMBULATORY_CARE_PROVIDER_SITE_OTHER): Payer: Self-pay

## 2017-05-04 ENCOUNTER — Encounter: Payer: Self-pay | Admitting: Internal Medicine

## 2017-05-29 ENCOUNTER — Other Ambulatory Visit: Payer: Self-pay | Admitting: Physician Assistant

## 2017-05-29 NOTE — Progress Notes (Signed)
FOLLOW UP  Assessment and Plan:   Hypertension Well controlled with current medications  Monitor blood pressure at home; patient to call if consistently greater than 130/80 Continue DASH diet.   Reminder to go to the ER if any CP, SOB, nausea, dizziness, severe HA, changes vision/speech, left arm numbness and tingling and jaw pain.  CHF Disease process and medications discussed. Questions answered fully. Emphasized salt restriction, less than 2000mg  a day. Encouraged daily monitoring of the patient's weight, call office if 5 lb weight loss or gain in a day.  Encouraged regular exercise. If any increasing shortness of breath, swelling, or chest pressure go to ER immediately.  decrease your fluid intake to less than 2 L daily please remember to always increase your potassium intake with any increase of your fluid pill.   Stage 3 CKD Maintain hydration, avoid NSAIDS, monitor sugars, will monitor BMP with GFR  Thrombocytopenia Monitor CBCs closely, currently stable without signs of excessive bleeding, refer to hematology as indicated  Essential tremors Doing well with klonopin- continue 2-3 a day  Continue diet and meds as discussed. Further disposition pending results of labs. Discussed med's effects and SE's.   Over 30 minutes of exam, counseling, chart review, and critical decision making was performed.   Future Appointments  Date Time Provider [place]  06/30/2017  9:15 AM Liane Comber, NP [facility] None  08/08/2017 10:30 AM [name], MD [facility] None  02/27/2018  9:00 AM [name], MD [facility] None    ----------------------------------------------------------------------------------------------------------------------  HPI 72 y.o. male  presents for 1 month follow up for coumadin management and evaluation of chronic medical conditions .   Patient is on Coumadin for PAF (paroxysmal atrial fibrillation) ([place]) [I48.0] Patient's last INR is   Lab Results  Component Value Date   INR 2.7 (H) 05/02/2017   INR 1.5 (H) 04/03/2017   INR 2.5 (H) 03/04/2017    Patient denies SOB, CP, dizziness, nose bleeds, easy bleeding, and blood in stool/urine. His coumadin dose was not changed last visit. He has not taken ABX, has not missed any doses and no recent falls.   5 mg 4 x / week and 7.5mg  3 x / week   His blood pressure has been controlled at home, today their BP is BP: 114/64  He does not workout. He denies chest pain, shortness of breath, dizziness.  He has CKD stage 3 due to diabetes, being monitored Lab Results  Component Value Date   GFRNONAA 77 05/02/2017   He has CHF, weight is stable. BMI is Body mass index is 30 kg/m. Wt Readings from Last 3 Encounters:  06/02/17 194 lb 6.4 oz (88.2 kg)  05/02/17 192 lb 6.4 oz (87.3 kg)  04/03/17 194 lb (88 kg)   He is being monitored for thrombocytopenia while on anticoagulation Lab Results  Component Value Date   WBC 4.3 05/02/2017   HGB 14.1 05/02/2017   HCT 41.7 05/02/2017   MCV 89.1 05/02/2017   PLT 133 (L) 05/02/2017   Patient was not on allopurinol for gout and does report a recent flare- has since restarted allopurinol.  Lab Results  Component Value Date   LABURIC 3.8 (L) 05/02/2017     Current Medications:  Current Outpatient Medications on File Prior to Visit  Medication Sig  . allopurinol (ZYLOPRIM) 300 MG tablet TAKE 1 TABLET BY MOUTH DAILY FOR GOUT PREVENTION  . aspirin EC 81 MG tablet Take 81 mg by mouth daily.  . betamethasone dipropionate (DIPROLENE) 0.05 %  cream Apply topically.  . Black Pepper-Turmeric (TURMERIC COMPLEX/BLACK PEPPER PO) Take 1,500 mg by mouth daily.  Marland Kitchen buPROPion (WELLBUTRIN XL) 300 MG 24 hr tablet TAKE ONE TABLET BY MOUTH EVERY MORNING  . Cholecalciferol (VITAMIN D PO) Take 1,000 Int'l Units by mouth daily.   . clonazePAM (KLONOPIN) 0.5 MG tablet Take 1/2 to 1 tablet 2 to 3 x / day ONLY if needed for Anxiety Attack and please try to  limit to 5 days /week to avoid addiction  . finasteride (PROSCAR) 5 MG tablet TAKE 1 TABLET BY MOUTH DAILY  . glucose blood (FREESTYLE TEST STRIPS) test strip Check blood sugar 3 to 4 times daily for medication regulation.  . insulin NPH-regular Human (NOVOLIN 70/30) (70-30) 100 UNIT/ML injection Take 20-30 units 2 x day or as directed  . latanoprost (XALATAN) 0.005 % ophthalmic solution INT 1 GTT IN EACH EYE QHS  . MAGNESIUM-OXIDE 400 (241.3 Mg) MG tablet TK 1 T PO BID  . mycophenolate (MYFORTIC) 360 MG TBEC EC tablet TAKE 1 TABLET BY MOUTH TWICE DAILY  . tacrolimus (PROGRAF) 0.5 MG capsule take 1 tablet at bedtime.  . tacrolimus (PROGRAF) 1 MG capsule Take 1 mg by mouth 2 (two) times daily.  . traMADol (ULTRAM) 50 MG tablet Take 1 tablet (50 mg total) by mouth every 6 (six) hours as needed.  . warfarin (COUMADIN) 5 MG tablet TAKE 1 TO 2 TABLETS BY MOUTH DAILY AS DIRECTED  . atorvastatin (LIPITOR) 80 MG tablet Take 1/2 to 1 tablet daily or as directed for cholesterol   No current facility-administered medications on file prior to visit.      Allergies:  Allergies  Allergen Reactions  . Crestor [Rosuvastatin]     Elevated LFT's  . Lorazepam   . Losartan      Medical History:  Past Medical History:  Diagnosis Date  . Adenomatous colon polyp   . Allergy   . BPH (benign prostatic hyperplasia)   . CHF (congestive heart failure) ([place])   . 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 Diabetes mellitus type 2, controlled ([place])   . Glaucoma   . Gout   . 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 MI (myocardial infarction) ([place]) 09/26/2011  . NSTEMI (non-ST elevated myocardial infarction) ([place]) 11/26/2013   2013   . OSA (obstructive sleep apnea)   . PAF (paroxysmal atrial fibrillation) ([place])    s/p DC-CV in 6/13. Off coumadin due to ureteral bleed   Family history- Reviewed and unchanged Social history- Reviewed and  unchanged   Review of Systems:  Review of Systems  Constitutional: Negative for malaise/fatigue and weight loss.  HENT: Negative for hearing loss and tinnitus.   Eyes: Negative for blurred vision and double vision.  Respiratory: Negative for cough, shortness of breath and wheezing.   Cardiovascular: Negative for chest pain, palpitations, orthopnea, claudication and leg swelling.  Gastrointestinal: Negative for abdominal pain, blood in stool, constipation, diarrhea, heartburn, melena, nausea and vomiting.  Genitourinary: Negative.   Musculoskeletal: Negative for falls, joint pain and myalgias.  Skin: Negative for rash.  Neurological: Negative for dizziness, tingling, sensory change, weakness and headaches.  Endo/Heme/Allergies: Negative for polydipsia.  Psychiatric/Behavioral: Negative.   All other systems reviewed and are negative.   Physical Exam: BP 114/64   Pulse 64   Temp (!) 97.3 F (36.3 C)   Resp 18   Ht 5' 7.5" (1.715 m)   Wt 194  lb 6.4 oz (88.2 kg)   BMI 30.00 kg/m  Wt Readings from Last 3 Encounters:  06/02/17 194 lb 6.4 oz (88.2 kg)  05/02/17 192 lb 6.4 oz (87.3 kg)  04/03/17 194 lb (88 kg)   General Appearance: Well nourished, in no apparent distress. Neck: thyroid normal.  Respiratory: Respiratory effort normal, BS equal bilaterally without rales, rhonchi, wheezing or stridor.  Cardio: RRR with no MRGs. Brisk peripheral pulses.  Abdomen: Soft, + BS.  Non tender, no guarding, rebound, hernias, masses. Lymphatics: Non tender without lymphadenopathy.  Musculoskeletal: Full ROM, 5/5 strength, Antalgic gait Skin: right anterior lateral leg with 1cm, slightly tender, no redness, distal swelling, warmth. Warm, dry without rashes, lesions, ecchymosis.  Neuro: Cranial nerves intact. No cerebellar symptoms.  Psych: Awake and oriented X 3, normal affect, Insight and Judgment appropriate.    Vicie Mutters, PA-C 9:58 AM [facility]

## 2017-06-02 ENCOUNTER — Ambulatory Visit (INDEPENDENT_AMBULATORY_CARE_PROVIDER_SITE_OTHER): Payer: Medicare Other | Admitting: Physician Assistant

## 2017-06-02 VITALS — BP 114/64 | HR 64 | Temp 97.3°F | Resp 18 | Ht 67.5 in | Wt 194.4 lb

## 2017-06-02 DIAGNOSIS — N182 Chronic kidney disease, stage 2 (mild): Secondary | ICD-10-CM

## 2017-06-02 DIAGNOSIS — Z1159 Encounter for screening for other viral diseases: Secondary | ICD-10-CM

## 2017-06-02 DIAGNOSIS — D696 Thrombocytopenia, unspecified: Secondary | ICD-10-CM

## 2017-06-02 DIAGNOSIS — Z7901 Long term (current) use of anticoagulants: Secondary | ICD-10-CM

## 2017-06-02 DIAGNOSIS — E1122 Type 2 diabetes mellitus with diabetic chronic kidney disease: Secondary | ICD-10-CM | POA: Diagnosis not present

## 2017-06-02 DIAGNOSIS — F325 Major depressive disorder, single episode, in full remission: Secondary | ICD-10-CM

## 2017-06-02 DIAGNOSIS — I480 Paroxysmal atrial fibrillation: Secondary | ICD-10-CM

## 2017-06-02 DIAGNOSIS — I5022 Chronic systolic (congestive) heart failure: Secondary | ICD-10-CM

## 2017-06-02 MED ORDER — CLONAZEPAM 0.5 MG PO TABS: ORAL_TABLET | ORAL | 0 refills | Status: DC

## 2017-06-02 NOTE — Patient Instructions (Signed)

## 2017-06-03 LAB — BASIC METABOLIC PANEL WITH GFR
BUN: 21 mg/dL (ref 7–25)
CO2: 26 mmol/L (ref 20–32)
Calcium: 9.9 mg/dL (ref 8.6–10.3)
Chloride: 105 mmol/L (ref 98–110)
Creat: 1.08 mg/dL (ref 0.70–1.25)
GFR, EST AFRICAN AMERICAN: 81 mL/min/{1.73_m2} (ref >=60)
GFR, Est Non African American: 70 mL/min/{1.73_m2} (ref >=60)
Glucose, Bld: 133 mg/dL — ABNORMAL HIGH (ref 65–99)
POTASSIUM: 4.7 mmol/L (ref 3.5–5.3)
Sodium: 141 mmol/L (ref 135–146)

## 2017-06-03 LAB — CBC WITH DIFFERENTIAL/PLATELET
BASOS PCT: 1.1 %
Basophils Absolute: 48 cells/uL (ref 0–200)
Eosinophils Absolute: 70 cells/uL (ref 15–500)
Eosinophils Relative: 1.6 %
HCT: 43.8 % (ref 38.5–50.0)
Hemoglobin: 15.2 g/dL (ref 13.2–17.1)
Lymphs Abs: 1016 cells/uL (ref 850–3900)
MCH: 31.7 pg (ref 27.0–33.0)
MCHC: 34.7 g/dL (ref 32.0–36.0)
MCV: 91.3 fL (ref 80.0–100.0)
MONOS PCT: 7 %
MPV: 9.9 fL (ref 7.5–12.5)
Neutro Abs: 2957 cells/uL (ref 1500–7800)
Neutrophils Relative %: 67.2 %
PLATELETS: 135 10*3/uL — AB (ref 140–400)
RBC: 4.8 10*6/uL (ref 4.20–5.80)
RDW: 12.7 % (ref 11.0–15.0)
TOTAL LYMPHOCYTE: 23.1 %
WBC mixed population: 308 cells/uL (ref 200–950)
WBC: 4.4 10*3/uL (ref 3.8–10.8)

## 2017-06-03 LAB — PROTIME-INR
INR: 2 — AB
Prothrombin Time: 20.7 s — ABNORMAL HIGH (ref 9.0–11.5)

## 2017-06-03 LAB — HEPATITIS B SURFACE ANTIBODY, QUANTITATIVE

## 2017-06-03 LAB — HEPATITIS C ANTIBODY
Hepatitis C Ab: NONREACTIVE
SIGNAL TO CUT-OFF: 0.02 (ref <1.00)

## 2017-06-09 ENCOUNTER — Encounter (INDEPENDENT_AMBULATORY_CARE_PROVIDER_SITE_OTHER): Payer: Self-pay

## 2017-06-16 ENCOUNTER — Other Ambulatory Visit: Payer: Self-pay | Admitting: Internal Medicine

## 2017-06-16 DIAGNOSIS — I482 Chronic atrial fibrillation, unspecified: Secondary | ICD-10-CM

## 2017-06-30 ENCOUNTER — Ambulatory Visit: Payer: Self-pay | Admitting: Adult Health

## 2017-07-16 ENCOUNTER — Other Ambulatory Visit: Payer: Self-pay | Admitting: Internal Medicine

## 2017-07-28 ENCOUNTER — Other Ambulatory Visit: Payer: Self-pay | Admitting: Internal Medicine

## 2017-07-28 ENCOUNTER — Encounter: Payer: Self-pay | Admitting: Internal Medicine

## 2017-07-28 MED ORDER — PREDNISONE 20 MG PO TABS: ORAL_TABLET | ORAL | 2 refills | Status: DC

## 2017-07-30 ENCOUNTER — Encounter: Payer: Self-pay | Admitting: Internal Medicine

## 2017-07-30 NOTE — Progress Notes (Signed)
Subjective:    Patient ID: Robert Arnold, male    DOB: [birth year], 72 y.o.   MRN: [id number]  HPI  This very nice 72 yo MWM with HTN,ASHD, cAfib,  Ins Dep T2_DM, Renal Transplant and hx of Gout. Patient presents with c/o Lt Podagra and denies missing doses of Allopurinol. He has been on Prednisone taper the lat 4 days with marked improvement.  He is on Insulin 70/30 taking 25 u qam and 12-15 u qpm with CBG's <150's. 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   Last PT/INR 2.o x on 06/02/2017 Last Uric acid 3.8 low on 05/02/2017  Medication Sig  . allopurinol  300 MG tablet TAKE 1 TABLET BY MOUTH DAILY FOR GOUT PREVENTION  . aspirin EC 81 MG tablet Take 81 mg by mouth daily.  Marland Kitchen atorvastatin ( 80 MG tablet Take 1/2 to 1 tablet daily or as directed for cholesterol  . DIPROLENE 0.05 % cream Apply topically.  Renard Hamper Pepper-Turmeric  Take 1,500 mg by mouth daily.  Marland Kitchen buPROPion (WELLBUTRIN XL) 300 MG 24 hr tablet TAKE ONE TABLET BY MOUTH EVERY MORNING  . VITAMIN D Take 1,000 Int'l Units by mouth daily.   . clonazePAM  0.5 MG tablet TAKE 1/2-1 TAB UP TO THREE TIMES DAILY FOR TREMOR  . finasteride  5 MG tablet TAKE 1 TABLET BY MOUTH DAILY  . NOVOLIN 70/30  injec Take 20-30 units 2 x day or as directed  . lXALATAN  Ophth soln INT 1 GTT IN EACH EYE QHS  . MAGNESIUM 400 MG  TK 1 T PO BID  . MYFORTIC) 360 MG  TAKE 1 TABLET BY MOUTH TWICE DAILY  . predniSONE  20 MG tablet 1 tab 3x/day-2 days, then 2x/day-2 days, then 1x/day-3 days  . PROGRAF 1 MG capsule Take  2  times daily.  . traMADol 50 MG tablet Take 1 tab every 6  hours as needed.  . warfarin  5 MG tablet TAKE 1-2 TABLETS  ONCE DAILY    Allergies  Allergen Reactions  . Crestor [Rosuvastatin]     Elevated LFT's  . Lorazepam   . Losartan    FHx & SHx - reviewed  Review of Systems  10 point systems review negative except as above.    Objective:   Physical Exam  BP 122/80   Pulse 64   Temp  (!) 97.5 F (36.4 C)   Resp 16   Ht 5' 7.5" (1.715 m)   Wt 188 lb 3.2 oz (85.4 kg)   BMI 29.04 kg/m   HEENT - WNL. Neck - supple.  Chest - Clear equal BS. Cor - Nl HS. RRR w/o sig MGR. PP 1(+). No edema. MS- FROM w/o deformities.  Gait Nl. Neuro -  Nl w/o focal abnormalities.    Assessment & Plan:   1. Essential hypertension  - CBC with Differential/Platelet - COMPLETE METABOLIC PANEL WITH GFR - Magnesium - TSH  2. Hyperlipidemia, mixed  - Lipid panel - TSH  3. Type 2 diabetes mellitus with stage 2 chronic kidney disease, without long-term current use of insulin (HCC)  - Hemoglobin A1c - Insulin, random  4. Vitamin D deficiency  - VITAMIN D 25 Hydroxy   5. PAF (paroxysmal atrial fibrillation) ([place])   6. Gout  - Uric acid  7. Long term current use of anticoagulant therapy  - Protime-INR  8. Medication management  - CBC with Differential/Platelet - COMPLETE  METABOLIC PANEL WITH GFR - Magnesium - Lipid panel - TSH - Hemoglobin A1c - Insulin, random - VITAMIN D 25 Hydroxyl - Protime-INR - Uric acid  Over 25 minutes of exam, counseling, chart review and  complex critical decision making was performed

## 2017-07-31 ENCOUNTER — Ambulatory Visit (INDEPENDENT_AMBULATORY_CARE_PROVIDER_SITE_OTHER): Payer: Medicare Other | Admitting: Internal Medicine

## 2017-07-31 ENCOUNTER — Encounter: Payer: Self-pay | Admitting: Internal Medicine

## 2017-07-31 VITALS — BP 122/80 | HR 64 | Temp 97.5°F | Resp 16 | Ht 67.5 in | Wt 188.2 lb

## 2017-07-31 DIAGNOSIS — M1030 Gout due to renal impairment, unspecified site: Secondary | ICD-10-CM | POA: Diagnosis not present

## 2017-07-31 DIAGNOSIS — I10 Essential (primary) hypertension: Secondary | ICD-10-CM | POA: Diagnosis not present

## 2017-07-31 DIAGNOSIS — E559 Vitamin D deficiency, unspecified: Secondary | ICD-10-CM

## 2017-07-31 DIAGNOSIS — E1122 Type 2 diabetes mellitus with diabetic chronic kidney disease: Secondary | ICD-10-CM | POA: Diagnosis not present

## 2017-07-31 DIAGNOSIS — N182 Chronic kidney disease, stage 2 (mild): Secondary | ICD-10-CM

## 2017-07-31 DIAGNOSIS — Z7901 Long term (current) use of anticoagulants: Secondary | ICD-10-CM | POA: Diagnosis not present

## 2017-07-31 DIAGNOSIS — Z79899 Other long term (current) drug therapy: Secondary | ICD-10-CM

## 2017-07-31 DIAGNOSIS — I480 Paroxysmal atrial fibrillation: Secondary | ICD-10-CM | POA: Diagnosis not present

## 2017-07-31 DIAGNOSIS — E782 Mixed hyperlipidemia: Secondary | ICD-10-CM | POA: Diagnosis not present

## 2017-07-31 NOTE — Patient Instructions (Signed)
Low-Purine Diet  Purines are compounds that affect the level of uric acid in your body. A low-purine diet is a diet that is low in purines. Eating a low-purine diet can prevent the level of uric acid in your body from getting too high and causing gout or kidney stones or both. What do I need to know about this diet?  Choose low-purine foods. Examples of low-purine foods are listed in the next section.  Drink plenty of fluids, especially water. Fluids can help remove uric acid from your body. Try to drink 8-16 cups (1.9-3.8 L) a day.  Limit foods high in fat, especially saturated fat, as fat makes it harder for the body to get rid of uric acid. Foods high in saturated fat include pizza, cheese, ice cream, whole milk, fried foods, and gravies. Choose foods that are lower in fat and lean sources of protein. Use olive oil when cooking as it contains healthy fats that are not high in saturated fat.  Limit alcohol. Alcohol interferes with the elimination of uric acid from your body. If you are having a gout attack, avoid all alcohol.  Keep in mind that different people's bodies react differently to different foods. You will probably learn over time which foods do or do not affect you. If you discover that a food tends to cause your gout to flare up, avoid eating that food. You can more freely enjoy foods that do not cause problems. If you have any questions about a food item, talk to your dietitian or health care provider. Which foods are low, moderate, and high in purines? The following is a list of foods that are low, moderate, and high in purines. You can eat any amount of the foods that are low in purines. You may be able to have small amounts of foods that are moderate in purines. Ask your health care provider how much of a food moderate in purines you can have. Avoid foods high in purines. Grains  Foods low in purines: Enriched white bread, pasta, rice, cake, cornbread, popcorn.  Foods moderate  in purines: Whole-grain breads and cereals, wheat germ, bran, oatmeal. Uncooked oatmeal. Dry wheat bran or wheat germ.  Foods high in purines: Pancakes, [place] toast, biscuits, muffins. Vegetables  Foods low in purines: All vegetables, except those that are moderate in purines.  Foods moderate in purines: Asparagus, cauliflower, spinach, mushrooms, green peas. Fruits  All fruits are low in purines. Meats and other Protein Foods  Foods low in purines: Eggs, nuts, peanut butter.  Foods moderate in purines: 80-90% lean beef, lamb, veal, pork, poultry, fish, eggs, peanut butter, nuts. Crab, lobster, oysters, and shrimp. Cooked dried beans, peas, and lentils.  Foods high in purines: Anchovies, sardines, herring, mussels, tuna, codfish, scallops, trout, and haddock. Robert Arnold. Organ meats (such as liver or kidney). Tripe. Game meat. Goose. Sweetbreads. Dairy  All dairy foods are low in purines. Low-fat and fat-free dairy products are best because they are low in saturated fat. Beverages  Drinks low in purines: Water, carbonated beverages, tea, coffee, cocoa.  Drinks moderate in purines: Soft drinks and other drinks sweetened with high-fructose corn syrup. Juices. To find whether a food or drink is sweetened with high-fructose corn syrup, look at the ingredients list.  Drinks high in purines: Alcoholic beverages (such as beer). Condiments  Foods low in purines: Salt, herbs, olives, pickles, relishes, vinegar.  Foods moderate in purines: Butter, margarine, oils, mayonnaise. Fats and Oils  Foods low in purines: All types, except  gravies and sauces made with meat.  Foods high in purines: Gravies and sauces made with meat. Other Foods  Foods low in purines: Sugars, sweets, gelatin. Cake. Soups made without meat.  Foods moderate in purines: Meat-based or fish-based soups, broths, or bouillons. Foods and drinks sweetened with high-fructose corn syrup.  Foods high in purines: High-fat  desserts (such as ice cream, cookies, cakes, pies, doughnuts, and chocolate). Contact your dietitian for more information on foods that are not listed here. This information is not intended to replace advice given to you by your health care provider. Make sure you discuss any questions you have with your health care provider. Document Released: 07/20/2010 Document Revised: 08/31/2015 Document Reviewed: 03/01/2013 Elsevier Interactive Patient Education  2017 [employer].  ++++++++++++++++++++++++++++ Recommend Adult Low Dose Aspirin or  coated  Aspirin 81 mg daily  To reduce risk of Colon Cancer 20 %,  Skin Cancer 26 % ,  Melanoma 46%  and  Pancreatic cancer 60% +++++++++++++++++++++++++ Vitamin D goal  is between 70-100.  Please make sure that you are taking your Vitamin D as directed.  It is very important as a natural anti-inflammatory  helping hair, skin, and nails, as well as reducing stroke and heart attack risk.  It helps your bones and helps with mood. It also decreases numerous cancer risks so please take it as directed.  Low Vit D is associated with a 200-300% higher risk for CANCER  and 200-300% higher risk for HEART   ATTACK  &  STROKE.   .....................................Marland Kitchen It is also associated with higher death rate at younger ages,  autoimmune diseases like Rheumatoid arthritis, Lupus, Multiple Sclerosis.    Also many other serious conditions, like depression, Alzheimer's Dementia, infertility, muscle aches, fatigue, fibromyalgia - just to name a few. ++++++++++++++++++++ Recommend the book "The END of DIETING" by Dr Excell Seltzer  & the book "The END of DIABETES " by Dr Excell Seltzer At [email or web address] - get book & Audio CD's    Being diabetic has a  300% increased risk for heart attack, stroke, cancer, and alzheimer- type vascular dementia. It is very important that you work harder with diet by avoiding all foods that are white. Avoid white rice (brown & wild rice is  OK), white potatoes (sweetpotatoes in moderation is OK), White bread or wheat bread or anything made out of white flour like bagels, donuts, rolls, buns, biscuits, cakes, pastries, cookies, pizza crust, and pasta (made from white flour & egg whites) - vegetarian pasta or spinach or wheat pasta is OK. Multigrain breads like Arnold's or Pepperidge Farm, or multigrain sandwich thins or flatbreads.  Diet, exercise and weight loss can reverse and cure diabetes in the early stages.  Diet, exercise and weight loss is very important in the control and prevention of complications of diabetes which affects every system in your body, ie. Brain - dementia/stroke, eyes - glaucoma/blindness, heart - heart attack/heart failure, kidneys - dialysis, stomach - gastric paralysis, intestines - malabsorption, nerves - severe painful neuritis, circulation - gangrene & loss of a leg(s), and finally cancer and Alzheimers.    I recommend avoid fried & greasy foods,  sweets/candy, white rice (brown or wild rice or Quinoa is OK), white potatoes (sweet potatoes are OK) - anything made from white flour - bagels, doughnuts, rolls, buns, biscuits,white and wheat breads, pizza crust and traditional pasta made of white flour & egg white(vegetarian pasta or spinach or wheat pasta is OK).  Multi-grain bread is OK - like  multi-grain flat bread or sandwich thins. Avoid alcohol in excess. Exercise is also important.    Eat all the vegetables you want - avoid meat, especially red meat and dairy - especially cheese.  Cheese is the most concentrated form of trans-fats which is the worst thing to clog up our arteries. Veggie cheese is OK which can be found in the fresh produce section at [employer] or [employer] or [employer]  +++++++++++++++++++++ DASH Eating Plan  DASH stands for "Dietary Approaches to Stop Hypertension."   The DASH eating plan is a healthy eating plan that has been shown to reduce high blood pressure (hypertension).  Additional health benefits may include reducing the risk of type 2 diabetes mellitus, heart disease, and stroke. The DASH eating plan may also help with weight loss. WHAT DO I NEED TO KNOW ABOUT THE DASH EATING PLAN? For the DASH eating plan, you will follow these general guidelines:  Choose foods with a percent daily value for sodium of less than 5% (as listed on the food label).  Use salt-free seasonings or herbs instead of table salt or sea salt.  Check with your health care provider or pharmacist before using salt substitutes.  Eat lower-sodium products, often labeled as "lower sodium" or "no salt added."  Eat fresh foods.  Eat more vegetables, fruits, and low-fat dairy products.  Choose whole grains. Look for the word "whole" as the first word in the ingredient list.  Choose fish   Limit sweets, desserts, sugars, and sugary drinks.  Choose heart-healthy fats.  Eat veggie cheese   Eat more home-cooked food and less restaurant, buffet, and fast food.  Limit fried foods.  Cook foods using methods other than frying.  Limit canned vegetables. If you do use them, rinse them well to decrease the sodium.  When eating at a restaurant, ask that your food be prepared with less salt, or no salt if possible.                      WHAT FOODS CAN I EAT? Read Dr Fara Olden Fuhrman's books on The End of Dieting & The End of Diabetes  Grains Whole grain or whole wheat bread. Brown rice. Whole grain or whole wheat pasta. Quinoa, bulgur, and whole grain cereals. Low-sodium cereals. Corn or whole wheat flour tortillas. Whole grain cornbread. Whole grain crackers. Low-sodium crackers.  Vegetables Fresh or frozen vegetables (raw, steamed, roasted, or grilled). Low-sodium or reduced-sodium tomato and vegetable juices. Low-sodium or reduced-sodium tomato sauce and paste. Low-sodium or reduced-sodium canned vegetables.   Fruits All fresh, canned (in natural juice), or frozen fruits.  Protein  Products  All fish and seafood.  Dried beans, peas, or lentils. Unsalted nuts and seeds. Unsalted canned beans.  Dairy Low-fat dairy products, such as skim or 1% milk, 2% or reduced-fat cheeses, low-fat ricotta or cottage cheese, or plain low-fat yogurt. Low-sodium or reduced-sodium cheeses.  Fats and Oils Tub margarines without trans fats. Light or reduced-fat mayonnaise and salad dressings (reduced sodium). Avocado. Safflower, olive, or canola oils. Natural peanut or almond butter.  Other Unsalted popcorn and pretzels. The items listed above may not be a complete list of recommended foods or beverages. Contact your dietitian for more options.  +++++++++++++++  WHAT FOODS ARE NOT RECOMMENDED? Grains/ White flour or wheat flour White bread. White pasta. White rice. Refined cornbread. Bagels and croissants. Crackers that contain trans fat.  Vegetables  Creamed or fried vegetables. Vegetables in a . Regular canned vegetables. Regular  canned tomato sauce and paste. Regular tomato and vegetable juices.  Fruits Dried fruits. Canned fruit in light or heavy syrup. Fruit juice.  Meat and Other Protein Products Meat in general - RED meat & White meat.  Fatty cuts of meat. Ribs, chicken wings, all processed meats as bacon, sausage, bologna, salami, fatback, hot dogs, bratwurst and packaged luncheon meats.  Dairy Whole or 2% milk, cream, half-and-half, and cream cheese. Whole-fat or sweetened yogurt. Full-fat cheeses or blue cheese. Non-dairy creamers and whipped toppings. Processed cheese, cheese spreads, or cheese curds.  Condiments Onion and garlic salt, seasoned salt, table salt, and sea salt. Canned and packaged gravies. Worcestershire sauce. Tartar sauce. Barbecue sauce. Teriyaki sauce. Soy sauce, including reduced sodium. Steak sauce. Fish sauce. Oyster sauce. Cocktail sauce. Horseradish. Ketchup and mustard. Meat flavorings and tenderizers. Bouillon cubes. Hot sauce. Tabasco sauce.  Marinades. Taco seasonings. Relishes.  Fats and Oils Butter, stick margarine, lard, shortening and bacon fat. Coconut, palm kernel, or palm oils. Regular salad dressings.  Pickles and olives. Salted popcorn and pretzels.  The items listed above may not be a complete list of foods and beverages to avoid.

## 2017-08-04 LAB — CBC WITH DIFFERENTIAL/PLATELET
BASOS ABS: 29 {cells}/uL (ref 0–200)
Basophils Absolute: 22 cells/uL (ref 0–200)
Basophils Relative: 0.3 %
Basophils Relative: 0.4 %
EOS ABS: 0 {cells}/uL — AB (ref 15–500)
EOS PCT: 0 %
EOS PCT: 0 %
Eosinophils Absolute: 0 cells/uL — ABNORMAL LOW (ref 15–500)
HCT: 43.4 % (ref 38.5–50.0)
HCT: 44.2 % (ref 38.5–50.0)
Hemoglobin: 14.9 g/dL (ref 13.2–17.1)
Hemoglobin: 14.9 g/dL (ref 13.2–17.1)
LYMPHS ABS: 796 {cells}/uL — AB (ref 850–3900)
Lymphs Abs: 818 cells/uL — ABNORMAL LOW (ref 850–3900)
MCH: 31.3 pg (ref 27.0–33.0)
MCH: 31.1 pg (ref 27.0–33.0)
MCHC: 34.3 g/dL (ref 32.0–36.0)
MCHC: 33.7 g/dL (ref 32.0–36.0)
MCV: 91.2 fL (ref 80.0–100.0)
MCV: 92.3 fL (ref 80.0–100.0)
MPV: 10 fL (ref 7.5–12.5)
MPV: 10.1 fL (ref 7.5–12.5)
Monocytes Relative: 5 %
Monocytes Relative: 4.7 %
NEUTROS PCT: 83.8 %
NEUTROS PCT: 83.7 %
Neutro Abs: 6117 cells/uL (ref 1500–7800)
Neutro Abs: 6110 cells/uL (ref 1500–7800)
Platelets: 149 10*3/uL (ref 140–400)
Platelets: 147 10*3/uL (ref 140–400)
RBC: 4.76 10*6/uL (ref 4.20–5.80)
RBC: 4.79 10*6/uL (ref 4.20–5.80)
RDW: 12.6 % (ref 11.0–15.0)
RDW: 12.4 % (ref 11.0–15.0)
TOTAL LYMPHOCYTE: 10.9 %
Total Lymphocyte: 11.2 %
WBC mixed population: 365 cells/uL (ref 200–950)
WBC: 7.3 10*3/uL (ref 3.8–10.8)
WBC: 7.3 10*3/uL (ref 3.8–10.8)
WBCMIX: 343 {cells}/uL (ref 200–950)

## 2017-08-04 LAB — COMPLETE METABOLIC PANEL WITH GFR
AG Ratio: 1.6 (calc) (ref 1.0–2.5)
ALBUMIN MSPROF: 4.1 g/dL (ref 3.6–5.1)
ALKALINE PHOSPHATASE (APISO): 102 U/L (ref 40–115)
ALT: 21 U/L (ref 9–46)
AST: 21 U/L (ref 10–35)
BUN: 25 mg/dL (ref 7–25)
CO2: 29 mmol/L (ref 20–32)
CREATININE: 1.07 mg/dL (ref 0.70–1.25)
Calcium: 9.9 mg/dL (ref 8.6–10.3)
Chloride: 104 mmol/L (ref 98–110)
GFR, EST AFRICAN AMERICAN: 82 mL/min/{1.73_m2} (ref >=60)
GFR, Est Non African American: 70 mL/min/{1.73_m2} (ref >=60)
GLUCOSE: 123 mg/dL — AB (ref 65–99)
Globulin: 2.6 g/dL (calc) (ref 1.9–3.7)
Potassium: 4.2 mmol/L (ref 3.5–5.3)
Sodium: 139 mmol/L (ref 135–146)
TOTAL PROTEIN: 6.7 g/dL (ref 6.1–8.1)
Total Bilirubin: 1.2 mg/dL (ref 0.2–1.2)

## 2017-08-04 LAB — URIC ACID
Uric Acid, Serum: 4 mg/dL (ref 4.0–8.0)
Uric Acid, Serum: 4 mg/dL (ref 4.0–8.0)

## 2017-08-04 LAB — HEMOGLOBIN A1C
Hgb A1c MFr Bld: 5.7 % of total Hgb — ABNORMAL HIGH (ref <5.7)
Mean Plasma Glucose: 117 (calc)
eAG (mmol/L): 6.5 (calc)

## 2017-08-04 LAB — PROTIME-INR
INR: 2.9 — AB
PROTHROMBIN TIME: 30.8 s — AB (ref 9.0–11.5)

## 2017-08-04 LAB — LIPID PANEL
Cholesterol: 146 mg/dL (ref <200)
HDL: 57 mg/dL (ref >40)
LDL Cholesterol (Calc): 68 mg/dL (calc)
Non-HDL Cholesterol (Calc): 89 mg/dL (calc) (ref <130)
TRIGLYCERIDES: 125 mg/dL (ref <150)
Total CHOL/HDL Ratio: 2.6 (calc) (ref <5.0)

## 2017-08-04 LAB — MAGNESIUM: MAGNESIUM: 2.1 mg/dL (ref 1.5–2.5)

## 2017-08-04 LAB — TSH: TSH: 1.44 m[IU]/L (ref 0.40–4.50)

## 2017-08-04 LAB — INSULIN, RANDOM: INSULIN: 21.4 u[IU]/mL — AB (ref 2.0–19.6)

## 2017-08-04 LAB — VITAMIN D 25 HYDROXY (VIT D DEFICIENCY, FRACTURES): Vit D, 25-Hydroxy: 87 ng/mL (ref 30–100)

## 2017-08-08 ENCOUNTER — Ambulatory Visit: Payer: Self-pay | Admitting: Internal Medicine

## 2017-08-13 ENCOUNTER — Other Ambulatory Visit: Payer: Self-pay | Admitting: Adult Health

## 2017-08-14 ENCOUNTER — Ambulatory Visit (INDEPENDENT_AMBULATORY_CARE_PROVIDER_SITE_OTHER): Payer: Medicare Other | Admitting: Internal Medicine

## 2017-08-14 VITALS — BP 118/64 | HR 52 | Temp 97.1°F | Resp 16 | Ht 67.5 in | Wt 187.0 lb

## 2017-08-14 DIAGNOSIS — S81811A Laceration without foreign body, right lower leg, initial encounter: Secondary | ICD-10-CM | POA: Diagnosis not present

## 2017-08-16 NOTE — Progress Notes (Signed)
Subjective:    Patient ID: Robert Arnold, male    DOB: 21-May-1945, 72 y.o.   MRN: [id number]  HPI  Patient is a nice 72 yo MWM w/ HTN [id number], Renal Transplant patient who presents with concern over a non-healing avulsed skin tear of his Rt shin. Denies fever or chills.  Medication Sig  . allopurinol (ZYLOPRIM) 300 MG tablet TAKE 1 TABLET BY MOUTH DAILY FOR GOUT PREVENTION  . aspirin EC 81 MG tablet Take 81 mg by mouth daily.  . betamethasone dipropionate (DIPROLENE) 0.05 % cream Apply topically.  . Black Pepper-Turmeric (TURMERIC COMPLEX/BLACK PEPPER PO) Take 1,500 mg by mouth daily.  Marland Kitchen buPROPion (WELLBUTRIN XL) 300 MG 24 hr tablet TAKE ONE TABLET BY MOUTH EVERY MORNING  . Cholecalciferol (VITAMIN D PO) Take 1,000 Int'l Units by mouth daily.   . clonazePAM (KLONOPIN) 0.5 MG tablet TAKE 1/2 TO 1 TABLET UP TO THREE TIMES DAILY FOR TREMOR, NOT FOR ANXIETY  . finasteride (PROSCAR) 5 MG tablet TAKE 1 TABLET BY MOUTH DAILY  . insulin NPH-regular Human (NOVOLIN 70/30) (70-30) 100 UNIT/ML injection Take 20-30 units 2 x day or as directed  . latanoprost (XALATAN) 0.005 % ophthalmic solution INT 1 GTT IN EACH EYE QHS  . MAGNESIUM-OXIDE 400 (241.3 Mg) MG tablet TK 1 T PO BID  . mycophenolate (MYFORTIC) 360 MG TBEC EC tablet TAKE 1 TABLET BY MOUTH TWICE DAILY  . OVER THE COUNTER MEDICATION Taking 500 mg OTC Magnesium daily in addition to Magnesium oxide 400 mg BID(RX)  . tacrolimus (PROGRAF) 1 MG capsule Take 1 mg by mouth 2 (two) times daily.  . traMADol (ULTRAM) 50 MG tablet Take 1 tablet (50 mg total) by mouth every 6 (six) hours as needed.  . warfarin (COUMADIN) 5 MG tablet TAKE 1 TO 2 TABLETS BY MOUTH ONCE DAILY AS DIRECTED  . atorvastatin (LIPITOR) 80 MG tablet Take 1/2 to 1 tablet daily or as directed for cholesterol   Allergies  Allergen Reactions  . Crestor [Rosuvastatin]     Elevated LFT's  . Lorazepam   . Losartan    Past Medical History:  Diagnosis Date  . Adenomatous colon  polyp   . Allergy   . BPH (benign prostatic hyperplasia)   . CHF (congestive heart failure) ([place])   . 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 Diabetes mellitus type 2, controlled ([place])   . Glaucoma   . Gout   . 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 MI (myocardial infarction) ([place]) 09/26/2011  . NSTEMI (non-ST elevated myocardial infarction) ([place]) 11/26/2013   2013   . OSA (obstructive sleep apnea)   . PAF (paroxysmal atrial fibrillation) ([place])    s/p DC-CV in 6/13. Off coumadin due to ureteral bleed   Past Surgical History:  Procedure Laterality Date  . AV FISTULA PLACEMENT  2011   Left forearm  . COLONOSCOPY    . CORONARY ANGIOPLASTY WITH STENT PLACEMENT    . CORONARY ARTERY BYPASS GRAFT  2013  . KIDNEY TRANSPLANT  09/25/2011  . PERITONEAL CATHETER INSERTION  2011   Review of Systems  10 point systems review negative except as above.   Objective:   Physical Exam  BP 118/64   Pulse (!) 52   Temp (!) 97.1 F (36.2 C)   Resp 16   Ht 5' 7.5" (1.715 m)   Wt 187 lb (84.8 kg)   BMI 28.86 kg/m   HEENT -  WNL. Neck - supple.  Chest - Clear equal BS. Cor - Nl HS. RRR w/o sig MGR. PP 1(+). No edema. MS- FROM w/o deformities.  Gait Nl. Neuro -  Nl w/o focal abnormalities. Shin - There is a 3" x 1/2"  full thickness skin tear with exposed subcut tissue. No purulence and no evidence of lymphangitis.     Assessment & Plan:   1. Laceration of right lower leg, initial encounter  - wound cleaned with H2O2 and compounded a Betadine -Sugar paste & applied to wound with an occlusive dressing. Patient instructed in wound care.

## 2017-08-28 DIAGNOSIS — E663 Overweight: Secondary | ICD-10-CM | POA: Insufficient documentation

## 2017-08-28 NOTE — Progress Notes (Signed)
Assessment and Plan:  .Diagnoses and all orders for this visit:  PAF (paroxysmal atrial fibrillation) ([place]) Check INR and will adjust medication according to labs.  Discussed if patient falls to immediately contact office or go to ER. Discussed foods that can increase or decrease Coumadin levels. Patient understands to call the office before starting a new medication. Follow up in one month.   Essential hypertension Continue medications Monitor blood pressure at home; call if consistently over 130/80 Continue DASH diet.   Reminder to go to the ER if any CP, SOB, nausea, dizziness, severe HA, changes vision/speech, left arm numbness and tingling and jaw pain.  Chronic systolic heart failure (HCC) Weights stable Emphasized salt restriction, less than 2000mg  a day. Encouraged daily monitoring of the patient's weight, call office if 3 lb weight loss or gain in a day.  Encouraged regular exercise. If any increasing shortness of breath, swelling, or chest pressure go to ER immediately.  decrease your fluid intake to less than 2 L daily please remember to always increase your potassium intake with any increase of your fluid pill.   Thrombocytopenia (HCC) CBC  Overweight (BMI 25.0-29.9) Long discussion about weight loss, diet, and exercise Recommended diet heavy in fruits and veggies and low in animal meats, cheeses, and dairy products, appropriate calorie intake Patient will work on continuing to cut out sugar and walking daily Discussed appropriate weight for height and initial goal (180 lb) Follow up at next visit    Further disposition pending results of labs. Discussed med's effects and SE's.   Over 30 minutes of exam, counseling, chart review, and critical decision making was performed.   Future Appointments  Date Time Provider [place]  10/03/2017  9:15 AM Liane Comber, NP [facility] None  11/07/2017  9:00 AM [name], MD [facility] None  02/27/2018  9:00 AM  [name], MD [facility] None    ------------------------------------------------------------------------------------------------------------------  HPI 72 y.o.male presents for follow up on CHF, a. Fib on coumadin, htn, weight.   BMI is Body mass index is 29.01 kg/m., he has been working on diet and exercise. Wt Readings from Last 3 Encounters:  08/29/17 188 lb (85.3 kg)  08/14/17 187 lb (84.8 kg)  07/31/17 188 lb 3.2 oz (85.4 kg)   His blood pressure has been controlled at home, today their BP is BP: 120/64  He does workout. He denies chest pain, shortness of breath, dizziness.  He has a history of Systolic CHF (EF 28% 4132), denies dyspnea on exertion, orthopnea, paroxysmal nocturnal dyspnea and edema. Positive for none. Wt Readings from Last 3 Encounters:  08/29/17 188 lb (85.3 kg)  08/14/17 187 lb (84.8 kg)  07/31/17 188 lb 3.2 oz (85.4 kg)   Patient is on Coumadin for PAF (paroxysmal atrial fibrillation) ([place]) [I48.0] Patient's last INR is  Lab Results  Component Value Date   INR 2.9 (H) 07/31/2017   INR 2.0 (H) 06/02/2017   INR 2.7 (H) 05/02/2017    Patient denies SOB, CP, dizziness, nose bleeds, easy bleeding, and blood in stool/urine. His coumadin dose was not changed last visit. He has not taken ABX, has not missed any doses and denies a fall.    Current dose: 5 mg daily.   Past Medical History:  Diagnosis Date  . Adenomatous colon polyp   . Allergy   . BPH (benign prostatic hyperplasia)   . CHF (congestive heart failure) ([place])   . Chronic kidney disease    due IgA nephropathy - s/p kidnet transplant  09/25/11  . Diabetes mellitus type 2, controlled ([place])   . Glaucoma   . Gout   . 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 MI (myocardial infarction) ([place]) 09/26/2011  . NSTEMI (non-ST elevated myocardial infarction) ([place]) 11/26/2013   2013   . OSA (obstructive sleep apnea)   . PAF (paroxysmal atrial fibrillation) ([place])    s/p DC-CV in  6/13. Off coumadin due to ureteral bleed     Allergies  Allergen Reactions  . Crestor [Rosuvastatin]     Elevated LFT's  . Lorazepam   . Losartan     Current Outpatient Medications on File Prior to Visit  Medication Sig  . allopurinol (ZYLOPRIM) 300 MG tablet TAKE 1 TABLET BY MOUTH DAILY FOR GOUT PREVENTION  . aspirin EC 81 MG tablet Take 81 mg by mouth daily.  Marland Kitchen atorvastatin (LIPITOR) 80 MG tablet Take 1/2 to 1 tablet daily or as directed for cholesterol  . betamethasone dipropionate (DIPROLENE) 0.05 % cream Apply topically.  . Black Pepper-Turmeric (TURMERIC COMPLEX/BLACK PEPPER PO) Take 1,500 mg by mouth daily.  Marland Kitchen buPROPion (WELLBUTRIN XL) 300 MG 24 hr tablet TAKE ONE TABLET BY MOUTH EVERY MORNING  . Cholecalciferol (VITAMIN D PO) Take 1,000 Int'l Units by mouth daily.   . finasteride (PROSCAR) 5 MG tablet TAKE 1 TABLET BY MOUTH DAILY  . glucose blood (FREESTYLE TEST STRIPS) test strip Check blood sugar 3 to 4 times daily for medication regulation.  . insulin NPH-regular Human (NOVOLIN 70/30) (70-30) 100 UNIT/ML injection Take 20-30 units 2 x day or as directed  . latanoprost (XALATAN) 0.005 % ophthalmic solution INT 1 GTT IN EACH EYE QHS  . MAGNESIUM-OXIDE 400 (241.3 Mg) MG tablet TK 1 T PO BID  . mycophenolate (MYFORTIC) 360 MG TBEC EC tablet TAKE 1 TABLET BY MOUTH TWICE DAILY  . OVER THE COUNTER MEDICATION Taking 500 mg OTC Magnesium daily in addition to Magnesium oxide 400 mg BID(RX)  . tacrolimus (PROGRAF) 1 MG capsule Take 1 mg by mouth 2 (two) times daily.  . traMADol (ULTRAM) 50 MG tablet Take 1 tablet (50 mg total) by mouth every 6 (six) hours as needed.  . warfarin (COUMADIN) 5 MG tablet TAKE 1 TO 2 TABLETS BY MOUTH ONCE DAILY AS DIRECTED  . clonazePAM (KLONOPIN) 0.5 MG tablet TAKE 1/2 TO 1 TABLET UP TO THREE TIMES DAILY FOR TREMOR, NOT FOR ANXIETY (Patient not taking: Reported on 08/29/2017)   No current facility-administered medications on file prior to visit.      ROS: all negative except above.   Physical Exam:  BP 120/64   Pulse 64   Temp 97.7 F (36.5 C)   Ht 5' 7.5" (1.715 m)   Wt 188 lb (85.3 kg)   SpO2 98%   BMI 29.01 kg/m   General Appearance: Well nourished, in no apparent distress. Eyes: PERRLA, EOMs, conjunctiva no swelling or erythema Sinuses: No Frontal/maxillary tenderness ENT/Mouth: Ext aud canals clear, TMs without erythema, bulging. No erythema, swelling, or exudate on post pharynx.  Tonsils not swollen or erythematous. Hearing normal.  Neck: Supple, thyroid normal.  Respiratory: Respiratory effort normal, BS equal bilaterally without rales, rhonchi, wheezing or stridor.  Cardio: Irregularly irregular, 3/6 early systolic blowing murmur. Brisk peripheral pulses without edema. Fistula to left wrist intact. Abdomen: Soft, + BS.  Non tender, no guarding, rebound, hernias, masses. Lymphatics: Non tender without lymphadenopathy.  Musculoskeletal: Full ROM, 5/5 strength, normal gait.  Skin: Warm, dry without rashes, lesions, ecchymosis.  Neuro:  Cranial nerves intact. Normal muscle tone, no cerebellar symptoms. Sensation intact.  Psych: Awake and oriented X 3, normal affect, Insight and Judgment appropriate.     Izora Ribas, NP 8:56 AM [facility]

## 2017-08-29 ENCOUNTER — Encounter: Payer: Self-pay | Admitting: Adult Health

## 2017-08-29 ENCOUNTER — Ambulatory Visit (INDEPENDENT_AMBULATORY_CARE_PROVIDER_SITE_OTHER): Payer: Medicare Other | Admitting: Adult Health

## 2017-08-29 VITALS — BP 120/64 | HR 64 | Temp 97.7°F | Ht 67.5 in | Wt 188.0 lb

## 2017-08-29 DIAGNOSIS — I480 Paroxysmal atrial fibrillation: Secondary | ICD-10-CM

## 2017-08-29 DIAGNOSIS — D696 Thrombocytopenia, unspecified: Secondary | ICD-10-CM

## 2017-08-29 DIAGNOSIS — I10 Essential (primary) hypertension: Secondary | ICD-10-CM | POA: Diagnosis not present

## 2017-08-29 DIAGNOSIS — I5022 Chronic systolic (congestive) heart failure: Secondary | ICD-10-CM

## 2017-08-29 DIAGNOSIS — E663 Overweight: Secondary | ICD-10-CM | POA: Diagnosis not present

## 2017-08-29 NOTE — Patient Instructions (Signed)
Aim for 7+ servings of fruits and vegetables daily  80+ fluid ounces of water or unsweet tea for healthy kidneys  Limit alcohol intake  Limit animal fats in diet for cholesterol and heart health - choose grass fed whenever available  Aim for low stress - take time to unwind and care for your mental health  Aim for 150 min of moderate intensity exercise weekly for heart health, and weights twice weekly for bone health  Aim for 7-9 hours of sleep daily      When it comes to diets, agreement about the perfect plan isn't easy to find, even among the experts. Experts at the [employer] developed an idea known as the Healthy Eating Plate. Just imagine a plate divided into logical, healthy portions.  The emphasis is on diet quality:  Load up on vegetables and fruits - one-half of your plate: Aim for color and variety, and remember that potatoes don't count.  Go for whole grains - one-quarter of your plate: Whole wheat, barley, wheat berries, quinoa, oats, brown rice, and foods made with them. If you want pasta, go with whole wheat pasta.  Protein power - one-quarter of your plate: Fish, chicken, beans, and nuts are all healthy, versatile protein sources. Limit red meat.  The diet, however, does go beyond the plate, offering a few other suggestions.  Use healthy plant oils, such as olive, canola, soy, corn, sunflower and peanut. Check the labels, and avoid partially hydrogenated oil, which have unhealthy trans fats.  If you're thirsty, drink water. Coffee and tea are good in moderation, but skip sugary drinks and limit milk and dairy products to one or two daily servings.  The type of carbohydrate in the diet is more important than the amount. Some sources of carbohydrates, such as vegetables, fruits, whole grains, and beans-are healthier than others.  Finally, stay active.  

## 2017-08-30 LAB — CBC WITH DIFFERENTIAL/PLATELET
BASOS ABS: 70 {cells}/uL (ref 0–200)
BASOS PCT: 1.3 %
EOS ABS: 70 {cells}/uL (ref 15–500)
Eosinophils Relative: 1.3 %
HCT: 45.7 % (ref 38.5–50.0)
HEMOGLOBIN: 15.4 g/dL (ref 13.2–17.1)
Lymphs Abs: 1291 cells/uL (ref 850–3900)
MCH: 30.9 pg (ref 27.0–33.0)
MCHC: 33.7 g/dL (ref 32.0–36.0)
MCV: 91.8 fL (ref 80.0–100.0)
MONOS PCT: 10.6 %
MPV: 9.7 fL (ref 7.5–12.5)
NEUTROS ABS: 3397 {cells}/uL (ref 1500–7800)
Neutrophils Relative %: 62.9 %
Platelets: 201 10*3/uL (ref 140–400)
RBC: 4.98 10*6/uL (ref 4.20–5.80)
RDW: 12.5 % (ref 11.0–15.0)
TOTAL LYMPHOCYTE: 23.9 %
WBC mixed population: 572 cells/uL (ref 200–950)
WBC: 5.4 10*3/uL (ref 3.8–10.8)

## 2017-08-30 LAB — PROTIME-INR
INR: 1.8 — AB
PROTHROMBIN TIME: 18.7 s — AB (ref 9.0–11.5)

## 2017-10-02 NOTE — Progress Notes (Signed)
Assessment and Plan:  .Diagnoses and all orders for this visit:  PAF (paroxysmal atrial fibrillation) ([place]) Check INR and will adjust medication according to labs.  Discussed if patient falls to immediately contact office or go to ER. Discussed foods that can increase or decrease Coumadin levels. Patient understands to call the office before starting a new medication. Follow up in one month.   Essential hypertension Continue medications Monitor blood pressure at home; call if consistently over 130/80 Continue DASH diet.   Reminder to go to the ER if any CP, SOB, nausea, dizziness, severe HA, changes vision/speech, left arm numbness and tingling and jaw pain.  Chronic systolic heart failure (HCC) Weights stable Emphasized salt restriction, less than 2000mg  a day. Encouraged daily monitoring of the patient's weight, call office if 3 lb weight loss or gain in a day.  Encouraged regular exercise. If any increasing shortness of breath, swelling, or chest pressure go to ER immediately.  decrease your fluid intake to less than 2 L daily please remember to always increase your potassium intake with any increase of your fluid pill.   Thrombocytopenia ([place]) Defer CBC - patient presents with recent labs from nephrology demonstrating stable from previous  Overweight (BMI 25.0-29.9) Long discussion about weight loss, diet, and exercise Recommended diet heavy in fruits and veggies and low in animal meats, cheeses, and dairy products, appropriate calorie intake Patient will work on continuing to cut out sugar and walking daily Discussed appropriate weight for height and initial goal (180 lb) Follow up at next visit    Further disposition pending results of labs. Discussed med's effects and SE's.   Over 30 minutes of exam, counseling, chart review, and critical decision making was performed.   Future Appointments  Date Time Provider [place]  11/07/2017  9:00 AM [name], MD  [facility] None  02/27/2018  9:00 AM [name], MD [facility] None    ------------------------------------------------------------------------------------------------------------------  HPI 72 y.o.male presents for follow up on CHF, a. Fib on coumadin, htn, weight.   BMI is Body mass index is 28.39 kg/m., he has been working on diet and exercise - walking most days.  Wt Readings from Last 3 Encounters:  10/03/17 184 lb (83.5 kg)  08/29/17 188 lb (85.3 kg)  08/14/17 187 lb (84.8 kg)   His blood pressure has been controlled at home, today their BP is BP: 130/72  He does workout. He denies chest pain, shortness of breath, dizziness.  He has a history of Systolic CHF (EF 99% 3570), denies dyspnea on exertion, orthopnea, paroxysmal nocturnal dyspnea and edema. Positive for none. Wt Readings from Last 3 Encounters:  10/03/17 184 lb (83.5 kg)  08/29/17 188 lb (85.3 kg)  08/14/17 187 lb (84.8 kg)   Patient is on Coumadin for PAF (paroxysmal atrial fibrillation) (HCC) [I48.0] Patient's last INR is  Lab Results  Component Value Date   INR 1.8 (H) 08/29/2017   INR 2.9 (H) 07/31/2017   INR 2.0 (H) 06/02/2017    Patient denies SOB, CP, dizziness, nose bleeds, easy bleeding, and blood in stool/urine. His coumadin dose was changed last visit. He has not taken ABX, has not missed any doses and denies a fall.    Current dose: 1 tab of coumadin 6 days and 1.5 tabs on Sunday   Past Medical History:  Diagnosis Date  . Adenomatous colon polyp   . Allergy   . BPH (benign prostatic hyperplasia)   . CHF (congestive heart failure) ([place])   . Chronic kidney  disease    due IgA nephropathy - s/p kidnet transplant 09/25/11  . Diabetes mellitus type 2, controlled ([place])   . Glaucoma   . Gout   . 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 MI (myocardial infarction) ([place]) 09/26/2011  . NSTEMI (non-ST elevated myocardial infarction) ([place]) 11/26/2013   2013   . OSA (obstructive  sleep apnea)   . PAF (paroxysmal atrial fibrillation) ([place])    s/p DC-CV in 6/13. Off coumadin due to ureteral bleed     Allergies  Allergen Reactions  . Crestor [Rosuvastatin]     Elevated LFT's  . Lorazepam   . Losartan     Current Outpatient Medications on File Prior to Visit  Medication Sig  . allopurinol (ZYLOPRIM) 300 MG tablet TAKE 1 TABLET BY MOUTH DAILY FOR GOUT PREVENTION  . aspirin EC 81 MG tablet Take 81 mg by mouth daily.  Marland Kitchen atorvastatin (LIPITOR) 80 MG tablet Take 1/2 to 1 tablet daily or as directed for cholesterol  . betamethasone dipropionate (DIPROLENE) 0.05 % cream Apply topically.  . Black Pepper-Turmeric (TURMERIC COMPLEX/BLACK PEPPER PO) Take 1,500 mg by mouth daily.  Marland Kitchen buPROPion (WELLBUTRIN XL) 300 MG 24 hr tablet TAKE ONE TABLET BY MOUTH EVERY MORNING  . Cholecalciferol (VITAMIN D PO) Take 1,000 Int'l Units by mouth daily.   . finasteride (PROSCAR) 5 MG tablet TAKE 1 TABLET BY MOUTH DAILY  . glucose blood (FREESTYLE TEST STRIPS) test strip Check blood sugar 3 to 4 times daily for medication regulation.  . insulin NPH-regular Human (NOVOLIN 70/30) (70-30) 100 UNIT/ML injection Take 20-30 units 2 x day or as directed  . latanoprost (XALATAN) 0.005 % ophthalmic solution INT 1 GTT IN EACH EYE QHS  . MAGNESIUM-OXIDE 400 (241.3 Mg) MG tablet TK 1 T PO BID  . mycophenolate (MYFORTIC) 360 MG TBEC EC tablet TAKE 1 TABLET BY MOUTH TWICE DAILY  . OVER THE COUNTER MEDICATION Taking 500 mg OTC Magnesium daily in addition to Magnesium oxide 400 mg BID(RX)  . tacrolimus (PROGRAF) 1 MG capsule Take 1 mg by mouth 2 (two) times daily.  Marland Kitchen warfarin (COUMADIN) 5 MG tablet TAKE 1 TO 2 TABLETS BY MOUTH ONCE DAILY AS DIRECTED  . clonazePAM (KLONOPIN) 0.5 MG tablet TAKE 1/2 TO 1 TABLET UP TO THREE TIMES DAILY FOR TREMOR, NOT FOR ANXIETY (Patient not taking: Reported on 10/03/2017)  . traMADol (ULTRAM) 50 MG tablet Take 1 tablet (50 mg total) by mouth every 6 (six) hours as needed.  (Patient not taking: Reported on 10/03/2017)   No current facility-administered medications on file prior to visit.     ROS: all negative except above.   Physical Exam:  BP 130/72   Pulse (!) 56   Temp (!) 96.6 F (35.9 C)   Ht 5' 7.5" (1.715 m)   Wt 184 lb (83.5 kg)   SpO2 97%   BMI 28.39 kg/m   General Appearance: Well nourished, in no apparent distress. Eyes: PERRLA, EOMs, conjunctiva no swelling or erythema Sinuses: No Frontal/maxillary tenderness ENT/Mouth: Ext aud canals clear, TMs without erythema, bulging. No erythema, swelling, or exudate on post pharynx.  Tonsils not swollen or erythematous. Hearing normal.  Neck: Supple, thyroid normal.  Respiratory: Respiratory effort normal, BS equal bilaterally without rales, rhonchi, wheezing or stridor.  Cardio: Irregularly irregular, 3/6 early systolic blowing murmur. Brisk peripheral pulses without edema. Fistula to left wrist intact. Abdomen: Soft, + BS.  Non tender, no guarding, rebound, hernias, masses. Lymphatics: Non tender without  lymphadenopathy.  Musculoskeletal: Full ROM, 5/5 strength, normal gait.  Skin: Warm, dry without rashes, lesions, ecchymosis.  Neuro: Cranial nerves intact. Normal muscle tone, no cerebellar symptoms. Sensation intact.  Psych: Awake and oriented X 3, normal affect, Insight and Judgment appropriate.     Izora Ribas, NP 9:28 AM [facility]

## 2017-10-03 ENCOUNTER — Encounter: Payer: Self-pay | Admitting: Adult Health

## 2017-10-03 ENCOUNTER — Ambulatory Visit (INDEPENDENT_AMBULATORY_CARE_PROVIDER_SITE_OTHER): Payer: Medicare Other | Admitting: Adult Health

## 2017-10-03 VITALS — BP 130/72 | HR 56 | Temp 96.6°F | Ht 67.5 in | Wt 184.0 lb

## 2017-10-03 DIAGNOSIS — I10 Essential (primary) hypertension: Secondary | ICD-10-CM | POA: Diagnosis not present

## 2017-10-03 DIAGNOSIS — D696 Thrombocytopenia, unspecified: Secondary | ICD-10-CM | POA: Diagnosis not present

## 2017-10-03 DIAGNOSIS — N182 Chronic kidney disease, stage 2 (mild): Secondary | ICD-10-CM

## 2017-10-03 DIAGNOSIS — I480 Paroxysmal atrial fibrillation: Secondary | ICD-10-CM

## 2017-10-03 DIAGNOSIS — Z7901 Long term (current) use of anticoagulants: Secondary | ICD-10-CM | POA: Diagnosis not present

## 2017-10-03 DIAGNOSIS — Z79899 Other long term (current) drug therapy: Secondary | ICD-10-CM | POA: Diagnosis not present

## 2017-10-03 DIAGNOSIS — I5022 Chronic systolic (congestive) heart failure: Secondary | ICD-10-CM | POA: Diagnosis not present

## 2017-10-03 DIAGNOSIS — E663 Overweight: Secondary | ICD-10-CM | POA: Diagnosis not present

## 2017-10-03 DIAGNOSIS — E1122 Type 2 diabetes mellitus with diabetic chronic kidney disease: Secondary | ICD-10-CM

## 2017-10-03 NOTE — Patient Instructions (Signed)
Can try melatonin 5mg-15 mg at night for sleep, can also do benadryl 25-50mg at night for sleep.  If this does not help we can try prescription medication.  Also here is some information about good sleep hygiene.   Insomnia Insomnia is frequent trouble falling and/or staying asleep. Insomnia can be a long term problem or a short term problem. Both are common. Insomnia can be a short term problem when the wakefulness is related to a certain stress or worry. Long term insomnia is often related to ongoing stress during waking hours and/or poor sleeping habits. Overtime, sleep deprivation itself can make the problem worse. Every little thing feels more severe because you are overtired and your ability to cope is decreased. CAUSES  Stress, anxiety, and depression. Poor sleeping habits. Distractions such as TV in the bedroom. Naps close to bedtime. Engaging in emotionally charged conversations before bed. Technical reading before sleep. Alcohol and other sedatives. They may make the problem worse. They can hurt normal sleep patterns and normal dream activity. Stimulants such as caffeine for several hours prior to bedtime. Pain syndromes and shortness of breath can cause insomnia. Exercise late at night. Changing time zones may cause sleeping problems (jet lag). It is sometimes helpful to have someone observe your sleeping patterns. They should look for periods of not breathing during the night (sleep apnea). They should also look to see how long those periods last. If you live alone or observers are uncertain, you can also be observed at a sleep clinic where your sleep patterns will be professionally monitored. Sleep apnea requires a checkup and treatment. Give your caregivers your medical history. Give your caregivers observations your family has made about your sleep.  SYMPTOMS  Not feeling rested in the morning. Anxiety and restlessness at bedtime. Difficulty falling and staying asleep. TREATMENT   Your caregiver may prescribe treatment for an underlying medical disorders. Your caregiver can give advice or help if you are using alcohol or other drugs for self-medication. Treatment of underlying problems will usually eliminate insomnia problems. Medications can be prescribed for short time use. They are generally not recommended for lengthy use. Over-the-counter sleep medicines are not recommended for lengthy use. They can be habit forming. You can promote easier sleeping by making lifestyle changes such as: Using relaxation techniques that help with breathing and reduce muscle tension. Exercising earlier in the day. Changing your diet and the time of your last meal. No night time snacks. Establish a regular time to go to bed. Counseling can help with stressful problems and worry. Soothing music and white noise may be helpful if there are background noises you cannot remove. Stop tedious detailed work at least one hour before bedtime. HOME CARE INSTRUCTIONS  Keep a diary. Inform your caregiver about your progress. This includes any medication side effects. See your caregiver regularly. Take note of: Times when you are asleep. Times when you are awake during the night. The quality of your sleep. How you feel the next day. This information will help your caregiver care for you. Get out of bed if you are still awake after 15 minutes. Read or do some quiet activity. Keep the lights down. Wait until you feel sleepy and go back to bed. Keep regular sleeping and waking hours. Avoid naps. Exercise regularly. Avoid distractions at bedtime. Distractions include watching television or engaging in any intense or detailed activity like attempting to balance the household checkbook. Develop a bedtime ritual. Keep a familiar routine of bathing, brushing your teeth,   climbing into bed at the same time each night, listening to soothing music. Routines increase the success of falling to sleep faster. Use  relaxation techniques. This can be using breathing and muscle tension release routines. It can also include visualizing peaceful scenes. You can also help control troubling or intruding thoughts by keeping your mind occupied with boring or repetitive thoughts like the old concept of counting sheep. You can make it more creative like imagining planting one beautiful flower after another in your backyard garden. During your day, work to eliminate stress. When this is not possible use some of the previous suggestions to help reduce the anxiety that accompanies stressful situations. MAKE SURE YOU:  Understand these instructions. Will watch your condition. Will get help right away if you are not doing well or get worse. Document Released: 03/22/2000 Document Revised: 06/17/2011 Document Reviewed: 04/22/2007 [employer] Patient Information 2015 [employer]. This information is not intended to replace advice given to you by your health care provider. Make sure you discuss any questions you have with your health care provider.  

## 2017-10-04 LAB — PROTIME-INR
INR: 1.6 — AB
Prothrombin Time: 17 s — ABNORMAL HIGH (ref 9.0–11.5)

## 2017-10-05 ENCOUNTER — Other Ambulatory Visit: Payer: Self-pay | Admitting: Adult Health

## 2017-10-05 DIAGNOSIS — I480 Paroxysmal atrial fibrillation: Secondary | ICD-10-CM

## 2017-10-07 ENCOUNTER — Other Ambulatory Visit: Payer: Self-pay | Admitting: Internal Medicine

## 2017-10-22 ENCOUNTER — Ambulatory Visit (INDEPENDENT_AMBULATORY_CARE_PROVIDER_SITE_OTHER): Payer: Medicare Other

## 2017-10-22 DIAGNOSIS — I480 Paroxysmal atrial fibrillation: Secondary | ICD-10-CM

## 2017-10-22 NOTE — Progress Notes (Signed)
Pt reports for PROTIME lab bld work.  Pt reports he takes his meds as follows: FIVE DAYS A WK----5MG S  & TWO DAYS A WK 7.5MG S.   Pt did report a bruise/ cut on his arm that he is cleaning & changing the dressing. He wanted the office to be aware. I told the pt that if it does not seem to be healing or be comes infected to please come into the office to have it looked at.  Pt agreed voiced understanding & went to the lab to have his bld drawn.

## 2017-10-23 LAB — PROTIME-INR
INR: 1.8 — ABNORMAL HIGH
Prothrombin Time: 19.1 s — ABNORMAL HIGH (ref 9.0–11.5)

## 2017-11-06 ENCOUNTER — Encounter: Payer: Self-pay | Admitting: Internal Medicine

## 2017-11-06 NOTE — Progress Notes (Signed)
This very nice [name] presents for 6 month follow up with HTN, ASCAD / CABG, HLD, T2_IDDMand Vit D Deficiency. 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  His GFR 's are remaining stable - most recently GFR 70 in April. Patient has Gout is controlled on current meds.        Patient is treated for HTN circa 1998 & BP has been controlled at home. Today's BP is at goal - 130/66. In June 2013, s/p his renal transplant, he had an acute MI and then he underwent CABG in Nov 2013.  After CABG, he had PO Afib and has been on Coumadin since for hx/o pAfib.   Patient has had no complaints of any cardiac type chest pain, palpitations, dyspnea / orthopnea / PND, dizziness, claudication, or dependent edema.     Hyperlipidemia is controlled with diet & meds. Patient denies myalgias or other med SE's. Last Lipids were at goal: Lab Results  Component Value Date   CHOL 170 11/07/2017   HDL 71 11/07/2017   LDLCALC 85 11/07/2017   TRIG 59 11/07/2017   CHOLHDL 2.4 11/07/2017      Also, the patient has history of T2_NIDDM (2003) manage initially with diet til starting Metformin in 2007.  After his renal transplant in 2013 , he was transitioned to Insulin.  He has had no symptoms of reactive hypoglycemia, diabetic polys, paresthesias or visual blurring.  Last A1c was at goal: Lab Results  Component Value Date   HGBA1C 5.5 11/07/2017      Further, the patient also has history of Vitamin D Deficiency and supplements vitamin D without any suspected side-effects. Last vitamin D was at goal:  Lab Results  Component Value Date   VD25OH 102 (H) 11/07/2017   Current Outpatient Medications on File Prior to Visit  Medication Sig  . allopurinol (ZYLOPRIM) 300 MG tablet TAKE 1 TABLET BY MOUTH DAILY FOR GOUT PREVENTION  . aspirin EC 81 MG tablet Take 81 mg by mouth daily.  . betamethasone dipropionate (DIPROLENE) 0.05 % cream Apply topically.  . Black  Pepper-Turmeric (TURMERIC COMPLEX/BLACK PEPPER PO) Take 1,500 mg by mouth daily.  Marland Kitchen buPROPion (WELLBUTRIN XL) 300 MG 24 hr tablet TAKE ONE TABLET BY MOUTH EVERY MORNING  . Cholecalciferol (VITAMIN D PO) Take 1,000 Int'l Units by mouth daily.   . finasteride (PROSCAR) 5 MG tablet TAKE 1 TABLET BY MOUTH ONCE DAILY  . glucose blood (FREESTYLE TEST STRIPS) test strip Check blood sugar 3 to 4 times daily for medication regulation.  . insulin NPH-regular Human (NOVOLIN 70/30) (70-30) 100 UNIT/ML injection Take 20-30 units 2 x day or as directed  . latanoprost (XALATAN) 0.005 % ophthalmic solution INT 1 GTT IN EACH EYE QHS  . MAGNESIUM-OXIDE 400 (241.3 Mg) MG tablet TK 1 T PO BID  . mycophenolate (MYFORTIC) 360 MG TBEC EC tablet TAKE 1 TABLET BY MOUTH TWICE DAILY  . OVER THE COUNTER MEDICATION Taking 500 mg OTC Magnesium daily in addition to Magnesium oxide 400 mg BID(RX)  . tacrolimus (PROGRAF) 1 MG capsule Take 1 mg by mouth 2 (two) times daily.  Marland Kitchen warfarin (COUMADIN) 5 MG tablet TAKE 1 TO 2 TABLETS BY MOUTH ONCE DAILY AS DIRECTED  . atorvastatin (LIPITOR) 80 MG tablet Take 1/2 to 1 tablet daily or as directed for cholesterol   No current facility-administered medications on file prior to visit.    Allergies  Allergen Reactions  .  Crestor [Rosuvastatin]     Elevated LFT's  . Lorazepam   . Losartan    PMHx:   Past Medical History:  Diagnosis Date  . Adenomatous colon polyp   . Allergy   . BPH (benign prostatic hyperplasia)   . CHF (congestive heart failure) ([place])   . 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 Diabetes mellitus type 2, controlled ([place])   . Glaucoma   . Gout   . 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 MI (myocardial infarction) ([place]) 09/26/2011  . NSTEMI (non-ST elevated myocardial infarction) ([place]) 11/26/2013   2013   . OSA (obstructive sleep apnea)   . PAF (paroxysmal atrial fibrillation) ([place])    s/p DC-CV in  6/13. Off coumadin due to ureteral bleed   Immunization History  Administered Date(s) Administered  . DT 06/05/2015  . Hepatitis B 04/08/2009  . Influenza, High Dose Seasonal PF 12/27/2013, 01/17/2015, 01/11/2016  . Influenza-Unspecified 12/25/2016  . Pneumococcal Conjugate-13 10/25/2013  . Pneumococcal Polysaccharide-23 06/05/2015  . Pneumococcal-Unspecified 04/08/2008  . Tdap 04/08/2008   Past Surgical History:  Procedure Laterality Date  . AV FISTULA PLACEMENT  2011   Left forearm  . COLONOSCOPY    . CORONARY ANGIOPLASTY WITH STENT PLACEMENT    . CORONARY ARTERY BYPASS GRAFT  2013  . KIDNEY TRANSPLANT  09/25/2011  . PERITONEAL CATHETER INSERTION  2011   FHx:    Reviewed / unchanged  SHx:    Reviewed / unchanged   Systems Review:  Constitutional: Denies fever, chills, wt changes, headaches, insomnia, fatigue, night sweats, change in appetite. Eyes: Denies redness, blurred vision, diplopia, discharge, itchy, watery eyes.  ENT: Denies discharge, congestion, post nasal drip, epistaxis, sore throat, earache, hearing loss, dental pain, tinnitus, vertigo, sinus pain, snoring.  CV: Denies chest pain, palpitations, irregular heartbeat, syncope, dyspnea, diaphoresis, orthopnea, PND, claudication or edema. Respiratory: denies cough, dyspnea, DOE, pleurisy, hoarseness, laryngitis, wheezing.  Gastrointestinal: Denies dysphagia, odynophagia, heartburn, reflux, water brash, abdominal pain or cramps, nausea, vomiting, bloating, diarrhea, constipation, hematemesis, melena, hematochezia  or hemorrhoids. Genitourinary: Denies dysuria, frequency, urgency, nocturia, hesitancy, discharge, hematuria or flank pain. Musculoskeletal: Denies arthralgias, myalgias, stiffness, jt. swelling, pain, limping or strain/sprain.  Skin: Denies pruritus, rash, hives, warts, acne, eczema or change in skin lesion(s). Neuro: No weakness, tremor, incoordination, spasms, paresthesia or pain. Psychiatric: Denies  confusion, memory loss or sensory loss. Endo: Denies change in weight, skin or hair change.  Heme/Lymph: No excessive bleeding, bruising or enlarged lymph nodes.  Physical Exam  BP 130/66   Pulse 65   Temp 97.6 F (36.4 C)   Resp 16   Ht 5' 7.5" (1.715 m)   Wt 180 lb (81.6 kg)   SpO2 99%   BMI 27.78 kg/m   Appears  well nourished, well groomed  and in no distress.  Eyes: PERRLA, EOMs, conjunctiva no swelling or erythema. Sinuses: No frontal/maxillary tenderness ENT/Mouth: EAC's clear, TM's nl w/o erythema, bulging. Nares clear w/o erythema, swelling, exudates. Oropharynx clear without erythema or exudates. Oral hygiene is good. Tongue normal, non obstructing. Hearing intact.  Neck: Supple. Thyroid not palpable. Car 2+/2+ without bruits, nodes or JVD. Chest: Respirations nl with BS clear & equal w/o rales, rhonchi, wheezing or stridor.  Cor: Heart sounds normal w/ regular rate and rhythm without sig. murmurs, gallops, clicks or rubs. Peripheral pulses normal and equal  without edema.  Abdomen: Soft & bowel sounds normal. Non-tender w/o guarding, rebound,  hernias, masses or organomegaly.  Lymphatics: Unremarkable.  Musculoskeletal: Full ROM all peripheral extremities, joint stability, 5/5 strength and normal gait.  Skin: Warm, dry without exposed rashes, lesions or ecchymosis apparent.  Neuro: Cranial nerves intact, reflexes equal bilaterally. Sensory-motor testing grossly intact. Tendon reflexes grossly intact.  Pysch: Alert & oriented x 3.  Insight and judgement nl & appropriate. No ideations.  Assessment and Plan:  1. Essential hypertension  - Continue medication, monitor blood pressure at home.  - Continue DASH diet.  Reminder to go to the ER if any CP,  SOB, nausea, dizziness, severe HA, changes vision/speech.  - CBC with Differential/Platelet - COMPLETE METABOLIC PANEL WITH GFR - Magnesium - TSH  2. Hyperlipidemia, mixed  - Continue diet/meds, exercise,& lifestyle  modifications.  - Continue monitor periodic cholesterol/liver & renal functions   - Lipid panel - TSH  3. Type 2 diabetes mellitus with stage 2 chronic kidney disease, without long-term current use of insulin (HCC)  - Continue diet, exercise, lifestyle modifications.  - Monitor appropriate labs.  - COMPLETE METABOLIC PANEL WITH GFR - Hemoglobin A1c  4. Vitamin D deficiency  - Continue supplementation.  - VITAMIN D 25 Hydroxyl  5. PAF (paroxysmal atrial fibrillation) ([place])  - Protime-INR  6. Gout   - Uric acid  7. Anticoagulant long-term use  - Protime-INR  8. Medication management  - CBC with Differential/Platelet - COMPLETE METABOLIC PANEL WITH GFR - Magnesium - Lipid panel - TSH - Hemoglobin A1c - VITAMIN D 25 Hydroxyl - Uric acid    Discussed  regular exercise, BP monitoring, weight control to achieve/maintain BMI less than 25 and discussed med and SE's. Recommended labs to assess and monitor clinical status with further disposition pending results of labs. Over 30 minutes of exam, counseling, chart review was performed.

## 2017-11-06 NOTE — Patient Instructions (Signed)
Bleeding Precautions When on Anticoagulant Therapy  WHAT IS ANTICOAGULANT THERAPY? Anticoagulant therapy is taking medicine to prevent or reduce blood clots. It is also called blood thinner therapy. Blood clots that form in your blood vessels can be dangerous. They can break loose and travel to your heart, lungs, or brain. This increases your risk of a heart attack or stroke. Anticoagulant therapy causes blood to clot more slowly. You may need anticoagulant therapy if you have:  A medical condition that increases the likelihood that blood clots will form.  A heart defect or a problem with heart rhythm. It is also a common treatment after heart surgery, such as valve replacement. WHAT ARE COMMON TYPES OF ANTICOAGULANT THERAPY? Anticoagulant medicine can be injected or taken by mouth.If you need anticoagulant therapy quickly at the hospital, the medicine may be injected under your skin or given through an IV tube. Heparin is a common example of an anticoagulant that you may get at the hospital. Most anticoagulant therapy is in the form of pills that you take at home every day. These may include:  Aspirin. This common blood thinner works by preventing blood cells (platelets) from sticking together to form a clot. Aspirin is not as strong as anticoagulants that slow down the time that it takes for your body to form a clot.  Clopidogrel. This is a newer type of drug that affects platelets. It is stronger than aspirin.  Warfarin. This is the most common anticoagulant. It changes the way your body uses vitamin K, a vitamin that helps your blood to clot. The risk of bleeding is higher with warfarin than with aspirin. You will need frequent blood tests to make sure you are taking the safest amount.  New anticoagulants. Several new drugs have been approved. They are all taken by mouth. Studies show that these drugs work as well as warfarin. They do not require blood testing. They may cause less bleeding  risk than warfarin. WHAT DO I NEED TO REMEMBER WHEN TAKING ANTICOAGULANT THERAPY? Anticoagulant therapy decreases your risk of forming a blood clot, but it increases your risk of bleeding. Work closely with your health care provider to make sure you are taking your medicine safely. These tips can help:  Learn ways to reduce your risk of bleeding.  If you are taking warfarin: ? Have blood tests as ordered by your health care provider. ? Do not make any sudden changes to your diet. Vitamin K in your diet can make warfarin less effective. ? Do not get pregnant. This medicine may cause birth defects.  Take your medicine at the same time every day. If you forget to take your medicine, take it as soon as you remember. If you miss a whole day, do not double your dose of medicine. Take your normal dose and call your health care provider to check in.  Do not stop taking your medicine on your own.  Tell your health care provider before you start taking any new medicine, vitamin, or herbal product. Some of these could interfere with your therapy.  Tell all of your health care providers that you are on anticoagulant therapy.  Do not have surgery, medical procedures, or dental work until you tell your health care provider that you are on anticoagulant therapy. WHAT CAN AFFECT HOW ANTICOAGULANTS WORK? Certain foods, vitamins, medicines, supplements, and herbal medicines change the way that anticoagulant therapy works. They may increase or decrease the effects of your anticoagulant therapy. Either result can be dangerous for you.    Many over-the-counter medicines for pain, colds, or stomach problems interfere with anticoagulant therapy. Take these only as told by your health care provider.  Do not drink alcohol. It can interfere with your medicine and increase your risk of an injury that causes bleeding.  If you are taking warfarin, do not begin eating more foods that contain vitamin K. These include  leafy green vegetables. Ask your health care provider if you should avoid any foods. WHAT ARE SOME WAYS TO PREVENT BLEEDING? You can prevent bleeding by taking certain precautions:  Be extra careful when you use knives, scissors, or other sharp objects.  Use an electric razor instead of a blade.  Do not use toothpicks.  Use a soft toothbrush.  Wear shoes that have nonskid soles.  Use bath mats and handrails in your bathroom.  Wear gloves while you do yard work.  Wear a helmet when you ride a bike.  Wear your seat belt.  Prevent falls by removing loose rugs and extension cords from areas where you walk.  Do not play contact sports or participate in other activities that have a high risk of injury. [place] PROVIDER? Call your health care provider if:  You miss a dose of medicine: ? And you are not sure what to do. ? For more than one day.  You have: ? Menstrual bleeding that is heavier than normal. ? Blood in your urine. ? A bloody nose or bleeding gums. ? Easy bruising. ? Blood in your stool (feces) or have black and tarry stool. ? Side effects from your medicine.  You feel weak or dizzy.  You become pregnant. Seek immediate medical care if:  You have bleeding that will not stop.  You have sudden and severe headache or belly pain.  You vomit or you cough up bright red blood.  You have a severe blow to your head. WHAT ARE SOME QUESTIONS TO ASK MY HEALTH CARE PROVIDER?  What is the best anticoagulant therapy for my condition?  What side effects should I watch for?  When should I take my medicine? What should I do if I forget to take it?  Will I need to have regular blood tests?  Do I need to change my diet? Are there foods or drinks that I should avoid?  What activities are safe for me?  What should I do if I want to get pregnant? This information is not intended to replace advice given to you by your health care provider.  Make sure you discuss any questions you have with your health care provider. Document Released: 03/06/2015 Document Reviewed: 03/06/2015 Elsevier Interactive Patient Education  2017 [place].  +++++++++++++++++++++++++++++ Recommend Adult Low Dose Aspirin or  coated  Aspirin 81 mg daily  To reduce risk of Colon Cancer 20 %,  Skin Cancer 26 % ,  Melanoma 46%  and  Pancreatic cancer 60% +++++++++++++++++++++++++ Vitamin D goal  is between 70-100.  Please make sure that you are taking your Vitamin D as directed.  It is very important as a natural anti-inflammatory  helping hair, skin, and nails, as well as reducing stroke and heart attack risk.  It helps your bones and helps with mood. It also decreases numerous cancer risks so please take it as directed.  Low Vit D is associated with a 200-300% higher risk for CANCER  and 200-300% higher risk for HEART   ATTACK  &  STROKE.   .....................................Marland Kitchen It is also associated with  higher death rate at younger ages,  autoimmune diseases like Rheumatoid arthritis, Lupus, Multiple Sclerosis.    Also many other serious conditions, like depression, Alzheimer's Dementia, infertility, muscle aches, fatigue, fibromyalgia - just to name a few. ++++++++++++++++++++ Recommend the book "The END of DIETING" by Dr Excell Seltzer  & the book "The END of DIABETES " by Dr Excell Seltzer At [email or web address] - get book & Audio CD's    Being diabetic has a  300% increased risk for heart attack, stroke, cancer, and alzheimer- type vascular dementia. It is very important that you work harder with diet by avoiding all foods that are white. Avoid white rice (brown & wild rice is OK), white potatoes (sweetpotatoes in moderation is OK), White bread or wheat bread or anything made out of white flour like bagels, donuts, rolls, buns, biscuits, cakes, pastries, cookies, pizza crust, and pasta (made from white flour & egg whites) - vegetarian pasta or spinach or  wheat pasta is OK. Multigrain breads like Arnold's or Pepper[employer], or multigrain sandwich thins or flatbreads.  Diet, exercise and weight loss can reverse and cure diabetes in the early stages.  Diet, exercise and weight loss is very important in the control and prevention of complications of diabetes which affects every system in your body, ie. Brain - dementia/stroke, eyes - glaucoma/blindness, heart - heart attack/heart failure, kidneys - dialysis, stomach - gastric paralysis, intestines - malabsorption, nerves - severe painful neuritis, circulation - gangrene & loss of a leg(s), and finally cancer and Alzheimers.    I recommend avoid fried & greasy foods,  sweets/candy, white rice (brown or wild rice or Quinoa is OK), white potatoes (sweet potatoes are OK) - anything made from white flour - bagels, doughnuts, rolls, buns, biscuits,white and wheat breads, pizza crust and traditional pasta made of white flour & egg white(vegetarian pasta or spinach or wheat pasta is OK).  Multi-grain bread is OK - like multi-grain flat bread or sandwich thins. Avoid alcohol in excess. Exercise is also important.    Eat all the vegetables you want - avoid meat, especially red meat and dairy - especially cheese.  Cheese is the most concentrated form of trans-fats which is the worst thing to clog up our arteries. Veggie cheese is OK which can be found in the fresh produce section at [employer] or [employer] or [employer]  +++++++++++++++++++++ DASH Eating Plan  DASH stands for "Dietary Approaches to Stop Hypertension."   The DASH eating plan is a healthy eating plan that has been shown to reduce high blood pressure (hypertension). Additional health benefits may include reducing the risk of type 2 diabetes mellitus, heart disease, and stroke. The DASH eating plan may also help with weight loss. WHAT DO I NEED TO KNOW ABOUT THE DASH EATING PLAN? For the DASH eating plan, you will follow these general  guidelines:  Choose foods with a percent daily value for sodium of less than 5% (as listed on the food label).  Use salt-free seasonings or herbs instead of table salt or sea salt.  Check with your health care provider or pharmacist before using salt substitutes.  Eat lower-sodium products, often labeled as "lower sodium" or "no salt added."  Eat fresh foods.  Eat more vegetables, fruits, and low-fat dairy products.  Choose whole grains. Look for the word "whole" as the first word in the ingredient list.  Choose fish   Limit sweets, desserts, sugars, and sugary drinks.  Choose heart-healthy fats.  Eat veggie cheese  Eat more home-cooked food and less restaurant, buffet, and fast food.  Limit fried foods.  Cook foods using methods other than frying.  Limit canned vegetables. If you do use them, rinse them well to decrease the sodium.  When eating at a restaurant, ask that your food be prepared with less salt, or no salt if possible.                      WHAT FOODS CAN I EAT? Read Dr Fara Olden Fuhrman's books on The End of Dieting & The End of Diabetes  Grains Whole grain or whole wheat bread. Brown rice. Whole grain or whole wheat pasta. Quinoa, bulgur, and whole grain cereals. Low-sodium cereals. Corn or whole wheat flour tortillas. Whole grain cornbread. Whole grain crackers. Low-sodium crackers.  Vegetables Fresh or frozen vegetables (raw, steamed, roasted, or grilled). Low-sodium or reduced-sodium tomato and vegetable juices. Low-sodium or reduced-sodium tomato sauce and paste. Low-sodium or reduced-sodium canned vegetables.   Fruits All fresh, canned (in natural juice), or frozen fruits.  Protein Products  All fish and seafood.  Dried beans, peas, or lentils. Unsalted nuts and seeds. Unsalted canned beans.  Dairy Low-fat dairy products, such as skim or 1% milk, 2% or reduced-fat cheeses, low-fat ricotta or cottage cheese, or plain low-fat yogurt. Low-sodium or  reduced-sodium cheeses.  Fats and Oils Tub margarines without trans fats. Light or reduced-fat mayonnaise and salad dressings (reduced sodium). Avocado. Safflower, olive, or canola oils. Natural peanut or almond butter.  Other Unsalted popcorn and pretzels. The items listed above may not be a complete list of recommended foods or beverages. Contact your dietitian for more options.  +++++++++++++++  WHAT FOODS ARE NOT RECOMMENDED? Grains/ White flour or wheat flour White bread. White pasta. White rice. Refined cornbread. Bagels and croissants. Crackers that contain trans fat.  Vegetables  Creamed or fried vegetables. Vegetables in a . Regular canned vegetables. Regular canned tomato sauce and paste. Regular tomato and vegetable juices.  Fruits Dried fruits. Canned fruit in light or heavy syrup. Fruit juice.  Meat and Other Protein Products Meat in general - RED meat & White meat.  Fatty cuts of meat. Ribs, chicken wings, all processed meats as bacon, sausage, bologna, salami, fatback, hot dogs, bratwurst and packaged luncheon meats.  Dairy Whole or 2% milk, cream, half-and-half, and cream cheese. Whole-fat or sweetened yogurt. Full-fat cheeses or blue cheese. Non-dairy creamers and whipped toppings. Processed cheese, cheese spreads, or cheese curds.  Condiments Onion and garlic salt, seasoned salt, table salt, and sea salt. Canned and packaged gravies. Worcestershire sauce. Tartar sauce. Barbecue sauce. Teriyaki sauce. Soy sauce, including reduced sodium. Steak sauce. Fish sauce. Oyster sauce. Cocktail sauce. Horseradish. Ketchup and mustard. Meat flavorings and tenderizers. Bouillon cubes. Hot sauce. Tabasco sauce. Marinades. Taco seasonings. Relishes.  Fats and Oils Butter, stick margarine, lard, shortening and bacon fat. Coconut, palm kernel, or palm oils. Regular salad dressings.  Pickles and olives. Salted popcorn and pretzels.  The items listed above may not be a complete  list of foods and beverages to avoid.

## 2017-11-07 ENCOUNTER — Encounter: Payer: Self-pay | Admitting: Internal Medicine

## 2017-11-07 ENCOUNTER — Ambulatory Visit (INDEPENDENT_AMBULATORY_CARE_PROVIDER_SITE_OTHER): Payer: Medicare Other | Admitting: Internal Medicine

## 2017-11-07 VITALS — BP 130/66 | HR 65 | Temp 97.6°F | Resp 16 | Ht 67.5 in | Wt 180.0 lb

## 2017-11-07 DIAGNOSIS — Z79899 Other long term (current) drug therapy: Secondary | ICD-10-CM

## 2017-11-07 DIAGNOSIS — N182 Chronic kidney disease, stage 2 (mild): Secondary | ICD-10-CM

## 2017-11-07 DIAGNOSIS — I10 Essential (primary) hypertension: Secondary | ICD-10-CM | POA: Diagnosis not present

## 2017-11-07 DIAGNOSIS — Z7901 Long term (current) use of anticoagulants: Secondary | ICD-10-CM

## 2017-11-07 DIAGNOSIS — E559 Vitamin D deficiency, unspecified: Secondary | ICD-10-CM | POA: Diagnosis not present

## 2017-11-07 DIAGNOSIS — E1122 Type 2 diabetes mellitus with diabetic chronic kidney disease: Secondary | ICD-10-CM

## 2017-11-07 DIAGNOSIS — I480 Paroxysmal atrial fibrillation: Secondary | ICD-10-CM

## 2017-11-07 DIAGNOSIS — E782 Mixed hyperlipidemia: Secondary | ICD-10-CM | POA: Diagnosis not present

## 2017-11-07 DIAGNOSIS — M1030 Gout due to renal impairment, unspecified site: Secondary | ICD-10-CM

## 2017-11-07 DIAGNOSIS — E349 Endocrine disorder, unspecified: Secondary | ICD-10-CM

## 2017-11-08 LAB — COMPLETE METABOLIC PANEL WITH GFR
AG Ratio: 2 (calc) (ref 1.0–2.5)
ALBUMIN MSPROF: 4.5 g/dL (ref 3.6–5.1)
ALT: 20 U/L (ref 9–46)
AST: 20 U/L (ref 10–35)
Alkaline phosphatase (APISO): 98 U/L (ref 40–115)
BUN: 21 mg/dL (ref 7–25)
CALCIUM: 10.2 mg/dL (ref 8.6–10.3)
CO2: 29 mmol/L (ref 20–32)
CREATININE: 1.08 mg/dL (ref 0.70–1.25)
Chloride: 104 mmol/L (ref 98–110)
GFR, EST AFRICAN AMERICAN: 81 mL/min/{1.73_m2} (ref >=60)
GFR, EST NON AFRICAN AMERICAN: 70 mL/min/{1.73_m2} (ref >=60)
GLOBULIN: 2.3 g/dL (ref 1.9–3.7)
Glucose, Bld: 119 mg/dL — ABNORMAL HIGH (ref 65–99)
POTASSIUM: 4.9 mmol/L (ref 3.5–5.3)
SODIUM: 140 mmol/L (ref 135–146)
TOTAL PROTEIN: 6.8 g/dL (ref 6.1–8.1)
Total Bilirubin: 1.5 mg/dL — ABNORMAL HIGH (ref 0.2–1.2)

## 2017-11-08 LAB — CBC WITH DIFFERENTIAL/PLATELET
Basophils Absolute: 61 cells/uL (ref 0–200)
Basophils Relative: 1.2 %
EOS ABS: 82 {cells}/uL (ref 15–500)
Eosinophils Relative: 1.6 %
HCT: 45 % (ref 38.5–50.0)
Hemoglobin: 15.4 g/dL (ref 13.2–17.1)
Lymphs Abs: 1061 cells/uL (ref 850–3900)
MCH: 31.6 pg (ref 27.0–33.0)
MCHC: 34.2 g/dL (ref 32.0–36.0)
MCV: 92.4 fL (ref 80.0–100.0)
MPV: 9.8 fL (ref 7.5–12.5)
Monocytes Relative: 8.5 %
NEUTROS PCT: 67.9 %
Neutro Abs: 3463 cells/uL (ref 1500–7800)
Platelets: 156 10*3/uL (ref 140–400)
RBC: 4.87 10*6/uL (ref 4.20–5.80)
RDW: 12.7 % (ref 11.0–15.0)
Total Lymphocyte: 20.8 %
WBC: 5.1 10*3/uL (ref 3.8–10.8)
WBCMIX: 434 {cells}/uL (ref 200–950)

## 2017-11-08 LAB — VITAMIN D 25 HYDROXY (VIT D DEFICIENCY, FRACTURES): Vit D, 25-Hydroxy: 102 ng/mL — ABNORMAL HIGH (ref 30–100)

## 2017-11-08 LAB — PROTIME-INR
INR: 2 — AB
PROTHROMBIN TIME: 20.7 s — AB (ref 9.0–11.5)

## 2017-11-08 LAB — HEMOGLOBIN A1C
Hgb A1c MFr Bld: 5.5 % of total Hgb (ref <5.7)
Mean Plasma Glucose: 111 (calc)
eAG (mmol/L): 6.2 (calc)

## 2017-11-08 LAB — LIPID PANEL
CHOL/HDL RATIO: 2.4 (calc) (ref <5.0)
CHOLESTEROL: 170 mg/dL (ref <200)
HDL: 71 mg/dL (ref >40)
LDL CHOLESTEROL (CALC): 85 mg/dL
NON-HDL CHOLESTEROL (CALC): 99 mg/dL (ref <130)
Triglycerides: 59 mg/dL (ref <150)

## 2017-11-08 LAB — TESTOSTERONE: TESTOSTERONE: 317 ng/dL (ref 250–827)

## 2017-11-08 LAB — URIC ACID: URIC ACID, SERUM: 3.8 mg/dL — AB (ref 4.0–8.0)

## 2017-11-08 LAB — TSH: TSH: 2.13 mIU/L (ref 0.40–4.50)

## 2017-11-08 LAB — MAGNESIUM: Magnesium: 2.1 mg/dL (ref 1.5–2.5)

## 2017-11-09 ENCOUNTER — Encounter: Payer: Self-pay | Admitting: Internal Medicine

## 2017-11-19 ENCOUNTER — Other Ambulatory Visit: Payer: Self-pay | Admitting: Internal Medicine

## 2017-11-26 ENCOUNTER — Other Ambulatory Visit: Payer: Self-pay | Admitting: Adult Health

## 2017-12-11 LAB — HM DIABETES EYE EXAM

## 2017-12-11 NOTE — Progress Notes (Signed)
Assessment and Plan:   PAF (paroxysmal atrial fibrillation) (HCC) Check INR and will adjust medication according to labs.  Discussed if patient falls to immediately contact office or go to ER. Discussed foods that can increase or decrease Coumadin levels. Patient understands to call the office before starting a new medication. Follow up in one month.   Essential hypertension Continue medications Monitor blood pressure at home; call if consistently over 130/80 Continue DASH diet.   Reminder to go to the ER if any CP, SOB, nausea, dizziness, severe HA, changes vision/speech, left arm numbness and tingling and jaw pain.  Chronic systolic heart failure (HCC) Weights stable Emphasized salt restriction, less than 2000mg  a day. Encouraged daily monitoring of the patient's weight, call office if 3 lb weight loss or gain in a day.  Encouraged regular exercise. If any increasing shortness of breath, swelling, or chest pressure go to ER immediately.  decrease your fluid intake to less than 2 L daily please remember to always increase your potassium intake with any increase of your fluid pill.   Thrombocytopenia (HCC) Check CBC  Overweight (BMI 25.0-29.9) Long discussion about weight loss, diet, and exercise Recommended diet heavy in fruits and veggies and low in animal meats, cheeses, and dairy products, appropriate calorie intake Patient will work on continuing to cut out sugar and walking daily Discussed appropriate weight for height and initial goal (175 lb) Follow up at next visit   CKD stage 2, s/p renal transplant Defer labs today; has upcoming scheduled with nephrology    Further disposition pending results of labs. Discussed med's effects and SE's.   Over 70 minutes of exam, counseling, chart review, and critical decision making was performed.   Future Appointments  Date Time Provider [place]  01/23/2018  8:45 AM Vicie Mutters, PA-C [facility] None  02/27/2018   9:00 AM [name], MD [facility] None    ------------------------------------------------------------------------------------------------------------------  HPI 72 y.o.male presents for follow up on CHF, a. Fib on coumadin, htn, weight.   BMI is Body mass index is 28.24 kg/m., he has been working on diet and exercise - walking most days, going to [employer] MWF, walking and doing weights.  Wt Readings from Last 3 Encounters:  12/15/17 183 lb (83 kg)  11/07/17 180 lb (81.6 kg)  10/22/17 184 lb 6.4 oz (83.6 kg)   His blood pressure has been controlled at home, today their BP is BP: 124/72  He does workout. He denies chest pain, shortness of breath, dizziness.  He has a history of Systolic CHF (EF 94% 7654), denies dyspnea on exertion, orthopnea, paroxysmal nocturnal dyspnea and edema. Positive for none. Wt Readings from Last 3 Encounters:  12/15/17 183 lb (83 kg)  11/07/17 180 lb (81.6 kg)  10/22/17 184 lb 6.4 oz (83.6 kg)   Patient is on Coumadin for PAF (paroxysmal atrial fibrillation) ([place]) [I48.0] Patient's last INR is  Lab Results  Component Value Date   INR 2.0 (H) 11/07/2017   INR 1.8 (H) 10/22/2017   INR 1.6 (H) 10/03/2017    Patient denies SOB, CP, dizziness, nose bleeds, easy bleeding, and blood in stool/urine. His coumadin dose was changed last visit. He has not taken ABX, has not missed any doses and denies a fall.    Current dose: 1 tab of coumadin 4 days and 1.5 tabs on MWF   Past Medical History:  Diagnosis Date  . Adenomatous colon polyp   . Allergy   . BPH (benign prostatic hyperplasia)   .  CHF (congestive heart failure) ([place])   . 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 Diabetes mellitus type 2, controlled ([place])   . Glaucoma   . Gout   . 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 MI (myocardial infarction) ([place]) 09/26/2011  . NSTEMI (non-ST elevated myocardial infarction) ([place]) 11/26/2013    2013   . OSA (obstructive sleep apnea)   . PAF (paroxysmal atrial fibrillation) ([place])    s/p DC-CV in 6/13. Off coumadin due to ureteral bleed     Allergies  Allergen Reactions  . Crestor [Rosuvastatin]     Elevated LFT's  . Lorazepam   . Losartan     Current Outpatient Medications on File Prior to Visit  Medication Sig  . Zinc 50 MG CAPS Take 50 mg by mouth daily.  Marland Kitchen allopurinol (ZYLOPRIM) 300 MG tablet TAKE 1 TABLET BY MOUTH DAILY FOR GOUT PREVENTION  . aspirin EC 81 MG tablet Take 81 mg by mouth daily.  Marland Kitchen atorvastatin (LIPITOR) 80 MG tablet Take 1/2 to 1 tablet daily or as directed for cholesterol  . betamethasone dipropionate (DIPROLENE) 0.05 % cream Apply topically.  . Black Pepper-Turmeric (TURMERIC COMPLEX/BLACK PEPPER PO) Take 1,500 mg by mouth daily.  Marland Kitchen buPROPion (WELLBUTRIN XL) 300 MG 24 hr tablet TAKE ONE TABLET BY MOUTH EVERY MORNING  . Cholecalciferol (VITAMIN D PO) Take 1,000 Int'l Units by mouth daily.   . finasteride (PROSCAR) 5 MG tablet TAKE 1 TABLET BY MOUTH ONCE DAILY  . glucose blood (FREESTYLE TEST STRIPS) test strip Check blood sugar 3 to 4 times daily for medication regulation.  . insulin NPH-regular Human (NOVOLIN 70/30) (70-30) 100 UNIT/ML injection Take 20-30 units 2 x day or as directed  . latanoprost (XALATAN) 0.005 % ophthalmic solution INT 1 GTT IN EACH EYE QHS  . MAGNESIUM-OXIDE 400 (241.3 Mg) MG tablet TK 1 T PO BID  . mycophenolate (MYFORTIC) 360 MG TBEC EC tablet TAKE 1 TABLET BY MOUTH TWICE DAILY  . OVER THE COUNTER MEDICATION Taking 500 mg OTC Magnesium daily in addition to Magnesium oxide 400 mg BID(RX)  . tacrolimus (PROGRAF) 1 MG capsule Take 1 mg by mouth 2 (two) times daily.  Marland Kitchen warfarin (COUMADIN) 5 MG tablet TAKE 1 TO 2 TABLETS BY MOUTH ONCE DAILY AS DIRECTED   No current facility-administered medications on file prior to visit.     ROS: Review of Systems  Constitutional: Negative for malaise/fatigue and weight loss.  HENT: Negative  for hearing loss and tinnitus.   Eyes: Negative for blurred vision and double vision.  Respiratory: Negative for cough, shortness of breath and wheezing.   Cardiovascular: Negative for chest pain, palpitations, orthopnea, claudication and leg swelling.  Gastrointestinal: Negative for abdominal pain, blood in stool, constipation, diarrhea, heartburn, melena, nausea and vomiting.  Genitourinary: Negative.   Musculoskeletal: Negative for joint pain and myalgias.  Skin: Negative for rash.  Neurological: Negative for dizziness, tingling, sensory change, weakness and headaches.  Endo/Heme/Allergies: Negative for polydipsia.  Psychiatric/Behavioral: Negative.   All other systems reviewed and are negative.    Physical Exam:  BP 124/72   Pulse 76   Temp (!) 97.3 F (36.3 C)   Ht 5' 7.5" (1.715 m)   Wt 183 lb (83 kg)   SpO2 97%   BMI 28.24 kg/m   General Appearance: Well nourished, in no apparent distress. Eyes: PERRLA, EOMs, conjunctiva no swelling or erythema Sinuses: No Frontal/maxillary tenderness ENT/Mouth: Ext  aud canals clear, TMs without erythema, bulging. No erythema, swelling, or exudate on post pharynx.  Tonsils not swollen or erythematous. Hearing normal.  Neck: Supple, thyroid normal.  Respiratory: Respiratory effort normal, BS equal bilaterally without rales, rhonchi, wheezing or stridor.  Cardio: Irregularly irregular, 3/6 early systolic blowing murmur. Brisk peripheral pulses without edema. Fistula to left wrist intact. Abdomen: Soft, + BS.  Non tender, no guarding, rebound, hernias, masses. Lymphatics: Non tender without lymphadenopathy.  Musculoskeletal: Full ROM, 5/5 strength, normal gait.  Skin: Warm, dry without rashes, lesions, ecchymosis.  Neuro: Cranial nerves intact. Normal muscle tone, no cerebellar symptoms. Sensation intact.  Psych: Awake and oriented X 3, normal affect, Insight and Judgment appropriate.    Izora Ribas, NP 8:48 AM [facility]

## 2017-12-15 ENCOUNTER — Ambulatory Visit (INDEPENDENT_AMBULATORY_CARE_PROVIDER_SITE_OTHER): Payer: Medicare Other | Admitting: Adult Health

## 2017-12-15 ENCOUNTER — Encounter: Payer: Self-pay | Admitting: Adult Health

## 2017-12-15 VITALS — BP 124/72 | HR 76 | Temp 97.3°F | Ht 67.5 in | Wt 183.0 lb

## 2017-12-15 DIAGNOSIS — I10 Essential (primary) hypertension: Secondary | ICD-10-CM

## 2017-12-15 DIAGNOSIS — E663 Overweight: Secondary | ICD-10-CM

## 2017-12-15 DIAGNOSIS — I480 Paroxysmal atrial fibrillation: Secondary | ICD-10-CM | POA: Diagnosis not present

## 2017-12-15 DIAGNOSIS — E1122 Type 2 diabetes mellitus with diabetic chronic kidney disease: Secondary | ICD-10-CM

## 2017-12-15 DIAGNOSIS — I5022 Chronic systolic (congestive) heart failure: Secondary | ICD-10-CM | POA: Diagnosis not present

## 2017-12-15 DIAGNOSIS — D696 Thrombocytopenia, unspecified: Secondary | ICD-10-CM | POA: Diagnosis not present

## 2017-12-15 DIAGNOSIS — Z940 Kidney transplant status: Secondary | ICD-10-CM

## 2017-12-15 DIAGNOSIS — N182 Chronic kidney disease, stage 2 (mild): Secondary | ICD-10-CM

## 2017-12-15 NOTE — Patient Instructions (Signed)
Know what a healthy weight is for you (roughly BMI <25) and aim to maintain this  Aim for 7+ servings of fruits and vegetables daily  65-80+ fluid ounces of water or unsweet tea for healthy kidneys  Limit to max 1 drink of alcohol per day; avoid smoking/tobacco  Limit animal fats in diet for cholesterol and heart health - choose grass fed whenever available  Avoid highly processed foods, and foods high in saturated/trans fats  Aim for low stress - take time to unwind and care for your mental health  Aim for 150 min of moderate intensity exercise weekly for heart health, and weights twice weekly for bone health  Aim for 7-9 hours of sleep daily       When it comes to diets, agreement about the perfect plan isn't easy to find, even among the experts. Experts at the [employer] developed an idea known as the Healthy Eating Plate. Just imagine a plate divided into logical, healthy portions.  The emphasis is on diet quality:  Load up on vegetables and fruits - one-half of your plate: Aim for color and variety, and remember that potatoes don't count.  Go for whole grains - one-quarter of your plate: Whole wheat, barley, wheat berries, quinoa, oats, brown rice, and foods made with them. If you want pasta, go with whole wheat pasta.  Protein power - one-quarter of your plate: Fish, chicken, beans, and nuts are all healthy, versatile protein sources. Limit red meat.  The diet, however, does go beyond the plate, offering a few other suggestions.  Use healthy plant oils, such as olive, canola, soy, corn, sunflower and peanut. Check the labels, and avoid partially hydrogenated oil, which have unhealthy trans fats.  If you're thirsty, drink water. Coffee and tea are good in moderation, but skip sugary drinks and limit milk and dairy products to one or two daily servings.  The type of carbohydrate in the diet is more important than the amount. Some sources of  carbohydrates, such as vegetables, fruits, whole grains, and beans-are healthier than others.  Finally, stay active.     

## 2017-12-16 LAB — CBC WITH DIFFERENTIAL/PLATELET
Basophils Absolute: 48 cells/uL (ref 0–200)
Basophils Relative: 1 %
EOS PCT: 2.3 %
Eosinophils Absolute: 110 cells/uL (ref 15–500)
HEMATOCRIT: 43 % (ref 38.5–50.0)
HEMOGLOBIN: 14.6 g/dL (ref 13.2–17.1)
LYMPHS ABS: 1046 {cells}/uL (ref 850–3900)
MCH: 31.3 pg (ref 27.0–33.0)
MCHC: 34 g/dL (ref 32.0–36.0)
MCV: 92.3 fL (ref 80.0–100.0)
MPV: 10.3 fL (ref 7.5–12.5)
Monocytes Relative: 10.2 %
NEUTROS PCT: 64.7 %
Neutro Abs: 3106 cells/uL (ref 1500–7800)
Platelets: 140 10*3/uL (ref 140–400)
RBC: 4.66 10*6/uL (ref 4.20–5.80)
RDW: 12.6 % (ref 11.0–15.0)
Total Lymphocyte: 21.8 %
WBC: 4.8 10*3/uL (ref 3.8–10.8)
WBCMIX: 490 {cells}/uL (ref 200–950)

## 2017-12-16 LAB — PROTIME-INR
INR: 2.1 — AB
Prothrombin Time: 21.7 s — ABNORMAL HIGH (ref 9.0–11.5)

## 2017-12-31 ENCOUNTER — Other Ambulatory Visit: Payer: Self-pay

## 2017-12-31 DIAGNOSIS — E785 Hyperlipidemia, unspecified: Secondary | ICD-10-CM

## 2017-12-31 MED ORDER — ATORVASTATIN CALCIUM 80 MG PO TABS: ORAL_TABLET | ORAL | 1 refills | Status: DC

## 2018-01-01 ENCOUNTER — Other Ambulatory Visit: Payer: Self-pay | Admitting: Internal Medicine

## 2018-01-02 ENCOUNTER — Ambulatory Visit (INDEPENDENT_AMBULATORY_CARE_PROVIDER_SITE_OTHER): Payer: Medicare Other

## 2018-01-02 DIAGNOSIS — Z23 Encounter for immunization: Secondary | ICD-10-CM

## 2018-01-21 NOTE — Progress Notes (Signed)
Assessment and Plan: Tooth removed Had tooth removal 10 days ago Mild bleeding still, no signs of infection, check CBC Has follow up 4 weeks  PAF (paroxysmal atrial fibrillation) (HCC) Check INR and will adjust medication according to labs.  Follow up in one month.   Essential hypertension Continue medications Monitor blood pressure at home; call if consistently over 130/80 Continue DASH diet.   Reminder to go to the ER if any CP, SOB, nausea, dizziness, severe HA, changes vision/speech, left arm numbness and tingling and jaw pain.  Chronic systolic heart failure (HCC) Weights stable Emphasized salt restriction, less than 2000mg  a day.. If any increasing shortness of breath, swelling, or chest pressure go to ER immediately.  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  Discussed disease progression and risks Discussed diet/exercise, weight management and risk modification  Senile purpura Discussed process, protect skin, sunscreen  Depression remission - continue medications, stress management techniques discussed, increase water, good sleep hygiene discussed, increase exercise, and increase veggies.    Further disposition pending results of labs. Discussed med's effects and SE's.   Over 30 minutes of exam, counseling, chart review, and critical decision making was performed.   Future Appointments  Date Time Provider [place]  02/27/2018  9:00 AM [name], MD [facility] None    ------------------------------------------------------------------------------------------------------------------  HPI 72 y.o.male presents for follow up on CHF, a. Fib on coumadin, htn, weight. Left tooth removal, pulled 10 days ago.  He did not D/C coumadin for this  procedure and has a follow up in four weeks. His blood pressure has been controlled at home, today their BP is BP: 118/70  He does workout. He denies chest pain, shortness of breath, dizziness.  He has a history of Systolic CHF (EF 22% 9798), denies dyspnea on exertion, orthopnea, paroxysmal nocturnal dyspnea and edema. Positive for none. Wt Readings from Last 3 Encounters:  01/23/18 184 lb (83.5 kg)  12/15/17 183 lb (83 kg)  11/07/17 180 lb (81.6 kg)   Patient is on Coumadin for Chronic systolic heart failure ([place]) [I50.22] Patient's last INR is  Lab Results  Component Value Date   INR 2.1 (H) 12/15/2017   INR 2.0 (H) 11/07/2017   INR 1.8 (H) 10/22/2017    Patient denies SOB, CP, dizziness, nose bleeds, easy bleeding, and blood in stool/urine. His coumadin dose was changed last visit. He has not taken ABX, has not missed any doses and denies a fall. We are monitoring his CBC.   Current dose: 1 tab of coumadin 4 days and 1.5 tabs on M W F   Past Medical History:  Diagnosis Date  . Adenomatous colon polyp   . Allergy   . BPH (benign prostatic hyperplasia)   . CHF (congestive heart failure) ([place])   . 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 Diabetes mellitus type 2, controlled ([place])   . Glaucoma   . Gout   . 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 MI (myocardial infarction) ([place]) 09/26/2011  . NSTEMI (non-ST elevated myocardial infarction) ([place]) 11/26/2013   2013   . OSA (obstructive sleep apnea)   . PAF (paroxysmal atrial fibrillation) ([place])    s/p DC-CV in 6/13. Off coumadin due to ureteral bleed  Allergies  Allergen Reactions  . Crestor [Rosuvastatin]     Elevated LFT's  . Lorazepam   . Losartan     Current Outpatient Medications on File Prior to Visit  Medication Sig  . allopurinol (ZYLOPRIM) 300 MG tablet TAKE 1 TABLET BY MOUTH DAILY FOR GOUT PREVENTION  . aspirin EC 81 MG tablet Take 81 mg by mouth  daily.  Marland Kitchen atorvastatin (LIPITOR) 80 MG tablet Take 1/2 to 1 tablet daily or as directed for cholesterol  . betamethasone dipropionate (DIPROLENE) 0.05 % cream Apply topically.  . Black Pepper-Turmeric (TURMERIC COMPLEX/BLACK PEPPER PO) Take 1,500 mg by mouth daily.  Marland Kitchen buPROPion (WELLBUTRIN XL) 300 MG 24 hr tablet TAKE ONE TABLET BY MOUTH EVERY MORNING  . Cholecalciferol (VITAMIN D PO) Take 1,000 Int'l Units by mouth daily.   . finasteride (PROSCAR) 5 MG tablet TAKE 1 TABLET BY MOUTH ONCE DAILY  . glucose blood (FREESTYLE TEST STRIPS) test strip Check blood sugar 3 to 4 times daily for medication regulation.  . insulin NPH-regular Human (NOVOLIN 70/30) (70-30) 100 UNIT/ML injection Take 20-30 units 2 x day or as directed  . latanoprost (XALATAN) 0.005 % ophthalmic solution INT 1 GTT IN EACH EYE QHS  . MAGNESIUM-OXIDE 400 (241.3 Mg) MG tablet TK 1 T PO BID  . mycophenolate (MYFORTIC) 360 MG TBEC EC tablet TAKE 1 TABLET BY MOUTH TWICE DAILY  . OVER THE COUNTER MEDICATION Taking 500 mg OTC Magnesium daily in addition to Magnesium oxide 400 mg BID(RX)  . tacrolimus (PROGRAF) 1 MG capsule Take 1 mg by mouth 2 (two) times daily.  Marland Kitchen warfarin (COUMADIN) 5 MG tablet TAKE 1 TO 2 TABLETS BY MOUTH ONCE DAILY AS DIRECTED  . Zinc 50 MG CAPS Take 50 mg by mouth daily.   No current facility-administered medications on file prior to visit.     ROS: all negative except above.   Physical Exam:  BP 118/70   Pulse 63   Temp (!) 97.3 F (36.3 C)   Resp 16   Ht 5' 7.5" (1.715 m)   Wt 184 lb (83.5 kg)   SpO2 99%   BMI 28.39 kg/m   General Appearance: Well nourished, in no apparent distress. Eyes: PERRLA, EOMs, conjunctiva no swelling or erythema Sinuses: No Frontal/maxillary tenderness ENT/Mouth: Ext aud canals clear, TMs without erythema, bulging. No erythema, swelling, or exudate on post pharynx.  Tonsils not swollen or erythematous. Hearing normal. Scant amount of red blood noted to left lower tooth,  #17, no pus, discharge or obvious abscess.  Neck: Supple, thyroid normal.  Respiratory: Respiratory effort normal, BS equal bilaterally without rales, rhonchi, wheezing or stridor.  Cardio: Irregularly irregular, 3/6 early systolic blowing murmur. Brisk peripheral pulses without edema. Fistula to left wrist intact. Abdomen: Soft, + BS.  Non tender, no guarding, rebound, hernias, masses. Lymphatics: Non tender without lymphadenopathy.  Musculoskeletal: Full ROM, 5/5 strength, normal gait.  Skin: Warm, dry without rashes, lesions, ecchymosis.  Neuro: Cranial nerves intact. Normal muscle tone, no cerebellar symptoms.  Psych: Awake and oriented X 3, normal affect, Insight and Judgment appropriate.     Vicie Mutters, PA-C 9:28 AM [facility]

## 2018-01-23 ENCOUNTER — Ambulatory Visit: Payer: Self-pay | Admitting: Physician Assistant

## 2018-01-23 ENCOUNTER — Encounter: Payer: Self-pay | Admitting: Physician Assistant

## 2018-01-23 ENCOUNTER — Ambulatory Visit (INDEPENDENT_AMBULATORY_CARE_PROVIDER_SITE_OTHER): Payer: Medicare Other | Admitting: Physician Assistant

## 2018-01-23 VITALS — BP 118/70 | HR 63 | Temp 97.3°F | Resp 16 | Ht 67.5 in | Wt 184.0 lb

## 2018-01-23 DIAGNOSIS — Z7901 Long term (current) use of anticoagulants: Secondary | ICD-10-CM

## 2018-01-23 DIAGNOSIS — I5022 Chronic systolic (congestive) heart failure: Secondary | ICD-10-CM | POA: Diagnosis not present

## 2018-01-23 DIAGNOSIS — F325 Major depressive disorder, single episode, in full remission: Secondary | ICD-10-CM

## 2018-01-23 DIAGNOSIS — D696 Thrombocytopenia, unspecified: Secondary | ICD-10-CM | POA: Diagnosis not present

## 2018-01-23 DIAGNOSIS — D692 Other nonthrombocytopenic purpura: Secondary | ICD-10-CM

## 2018-01-23 DIAGNOSIS — E1122 Type 2 diabetes mellitus with diabetic chronic kidney disease: Secondary | ICD-10-CM | POA: Diagnosis not present

## 2018-01-23 DIAGNOSIS — N182 Chronic kidney disease, stage 2 (mild): Secondary | ICD-10-CM

## 2018-01-23 DIAGNOSIS — I480 Paroxysmal atrial fibrillation: Secondary | ICD-10-CM

## 2018-01-24 LAB — CBC WITH DIFFERENTIAL/PLATELET
BASOS ABS: 51 {cells}/uL (ref 0–200)
Basophils Relative: 1 %
EOS PCT: 1.8 %
Eosinophils Absolute: 92 cells/uL (ref 15–500)
HEMATOCRIT: 43.4 % (ref 38.5–50.0)
Hemoglobin: 15 g/dL (ref 13.2–17.1)
LYMPHS ABS: 1285 {cells}/uL (ref 850–3900)
MCH: 31.4 pg (ref 27.0–33.0)
MCHC: 34.6 g/dL (ref 32.0–36.0)
MCV: 90.8 fL (ref 80.0–100.0)
MPV: 10.1 fL (ref 7.5–12.5)
Monocytes Relative: 11.1 %
Neutro Abs: 3106 cells/uL (ref 1500–7800)
Neutrophils Relative %: 60.9 %
PLATELETS: 156 10*3/uL (ref 140–400)
RBC: 4.78 10*6/uL (ref 4.20–5.80)
RDW: 12.4 % (ref 11.0–15.0)
TOTAL LYMPHOCYTE: 25.2 %
WBC mixed population: 566 cells/uL (ref 200–950)
WBC: 5.1 10*3/uL (ref 3.8–10.8)

## 2018-01-24 LAB — PROTIME-INR
INR: 3.2 — AB
Prothrombin Time: 31 s — ABNORMAL HIGH (ref 9.0–11.5)

## 2018-02-25 ENCOUNTER — Other Ambulatory Visit: Payer: Self-pay | Admitting: Adult Health

## 2018-02-26 NOTE — Progress Notes (Addendum)
[facility]   [name], M.D.     [name]. [name], P.A.-C Liane Comber, [place]                [address] [place] [address] Telephone [phone] Telefax [phone] Comprehensive Evaluation & Examination     This very nice [name]  presents for a  comprehensive evaluation and management of multiple medical co-morbidities.  Patient has been followed for HTN, ASCAD/CABG, T2_IDDM, Hyperlipidemia and Vitamin D Deficiency. Patient has Gout controlled on his meds. Also he has hx/o Depression in remission on treatment.      Patient presents today for annual exam and EKG is suspect for Complete heart block so arrangements are made for transport to the ER. 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 Patient is followed by his Nephrologist Dr Erling Cruz on Prograf & Myfortic.  Following his Renal transplant in 09/2011, he experienced an Acute MI and had PCA/Stent. Then in Nov 2013 he underwent CABG and had po Afib requiring CV. Patient remains on Coumadin for CHA2DS2-VASc of 5.     HTN predates circa 1998. Patient's BP has been controlled at home.  Today's BP is at goal - 130/82. Patient denies any recent cardiac symptoms as chest pain, palpitations, shortness of breath, dizziness or ankle swelling.     Patient's hyperlipidemia is controlled with diet and medications. Patient denies myalgias or other medication SE's. Last lipids were at goal: Lab Results  Component Value Date   CHOL 170 11/07/2017   HDL 71 11/07/2017   LDLCALC 85 11/07/2017   TRIG 59 11/07/2017   CHOLHDL 2.4 11/07/2017      Patient has hx/o Insulin Requiring T2_DM w/stable CKD2 (GFR 70) with his Kidney transplant.  Patient denies reactive hypoglycemic symptoms, visual blurring,  diabetic polys or paresthesias. Last A1c was at goal: Lab Results  Component Value Date   HGBA1C 5.5 11/07/2017       Finally, patient has history of Vitamin D Deficiency  ("24" / 2014)  and last vitamin D was at goal: Lab Results  Component Value Date   VD25OH 102 (H) 11/07/2017   Current Outpatient Medications on File Prior to Visit  Medication Sig  . allopurinol (ZYLOPRIM) 300 MG tablet TAKE 1 TABLET BY MOUTH DAILY FOR GOUT PREVENTION  . aspirin EC 81 MG tablet Take 81 mg by mouth daily.  Marland Kitchen atorvastatin (LIPITOR) 80 MG tablet Take 1/2 to 1 tablet daily or as directed for cholesterol  . betamethasone dipropionate (DIPROLENE) 0.05 % cream Apply topically.  . Black Pepper-Turmeric (TURMERIC COMPLEX/BLACK PEPPER PO) Take 1,500 mg by mouth daily.  Marland Kitchen buPROPion (WELLBUTRIN XL) 300 MG 24 hr tablet TAKE ONE TABLET BY MOUTH EVERY MORNING  . Cholecalciferol (VITAMIN D PO) Take 1,000 Int'l Units by mouth daily.   . finasteride (PROSCAR) 5 MG tablet TAKE 1 TABLET BY MOUTH ONCE DAILY  . glucose blood (FREESTYLE TEST STRIPS) test strip Check blood sugar 3 to 4 times daily for medication regulation.  . insulin NPH-regular Human (NOVOLIN 70/30) (70-30) 100 UNIT/ML injection Take 20-30 units 2 x day or as directed  . latanoprost (XALATAN) 0.005 % ophthalmic solution INT 1 GTT IN [facility]  EYE QHS  . MAGNESIUM-OXIDE 400 (241.3 Mg) MG tablet TK 1 T PO BID  . mycophenolate (MYFORTIC) 360 MG TBEC EC tablet TAKE 1 TABLET BY MOUTH TWICE DAILY  . OVER THE COUNTER MEDICATION Taking 500 mg OTC Magnesium daily in addition to Magnesium oxide 400 mg BID(RX)  . tacrolimus (PROGRAF) 0.5 MG capsule Take 0.5 mg by mouth 2 (two) times daily.  . tacrolimus (PROGRAF) 1 MG capsule Take 1 mg by mouth 2 (two) times daily.  Marland Kitchen warfarin (COUMADIN) 5 MG tablet TAKE 1 TO 2 TABLETS BY MOUTH ONCE DAILY AS DIRECTED  . Zinc 50 MG CAPS Take 50 mg by mouth daily.   No current facility-administered medications on file prior to visit.     Allergies  Allergen Reactions  . Crestor [Rosuvastatin]     Elevated LFT's  . Lorazepam   . Losartan    Past Medical History:  Diagnosis Date  . Adenomatous colon polyp   . Allergy   . BPH (benign prostatic hyperplasia)   . CHF (congestive heart failure) ([place])   . 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 Diabetes mellitus type 2, controlled ([place])   . Glaucoma   . Gout   . 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 MI (myocardial infarction) ([place]) 09/26/2011  . NSTEMI (non-ST elevated myocardial infarction) ([place]) 11/26/2013   2013   . OSA (obstructive sleep apnea)   . PAF (paroxysmal atrial fibrillation) ([place])    s/p DC-CV in 6/13. Off coumadin due to ureteral bleed   Health Maintenance  Topic Date Due  . OPHTHALMOLOGY EXAM  02/20/2017  . FOOT EXAM  01/31/2018  . URINE MICROALBUMIN  02/03/2018  . TETANUS/TDAP  04/08/2018  . HEMOGLOBIN A1C  05/10/2018  . COLONOSCOPY  03/13/2021  . INFLUENZA VACCINE  Completed  . Hepatitis C Screening  Completed  . PNA vac Low Risk Adult  Completed   Immunization History  Administered Date(s) Administered  . DT 06/05/2015  . Hepatitis B 04/08/2009  . Influenza, High Dose Seasonal PF 12/27/2013, 01/17/2015, 01/11/2016, 01/02/2018  . Influenza-Unspecified 12/25/2016  . Pneumococcal Conjugate-13 10/25/2013  . Pneumococcal Polysaccharide-23 06/05/2015  . Pneumococcal-Unspecified 04/08/2008  . Tdap 04/08/2008   Last Colon - 03/13/2016 - Dr Havery Moros - recc 5 yr f/u due Dec 2022  Past Surgical History:  Procedure Laterality Date  . AV FISTULA PLACEMENT  2011   Left forearm  . COLONOSCOPY    . CORONARY ANGIOPLASTY WITH STENT PLACEMENT    . CORONARY ARTERY BYPASS GRAFT  2013  . KIDNEY TRANSPLANT  09/25/2011  . PERITONEAL CATHETER INSERTION  2011   Family History  Problem Relation Age of Onset  . Heart attack Father   . Diabetes Mother   . COPD Mother   . Kidney disease  Sister   . Colon cancer Neg Hx   . Stomach cancer Neg Hx   . Rectal cancer Neg Hx   . Esophageal cancer Neg Hx   . Liver cancer Neg Hx    Social History   Socioeconomic History  . Marital status: Married    Spouse name: Not on file  . Number of children: 1  Occupational History  . Occupation: Retired Consulting civil engineer  . Smoking status: Former Smoker    Last attempt to quit: 11/29/1990    Years since quitting: 27.2  . Smokeless tobacco: Never Used  Substance and Sexual Activity  . Alcohol use:  Yes    Comment: 1-2 a month  . Drug use: No  . Sexual activity: Not on file    ROS Constitutional: Denies fever, chills, weight loss/gain, headaches, insomnia,  night sweats or change in appetite. Does c/o fatigue. Eyes: Denies redness, blurred vision, diplopia, discharge, itchy or watery eyes.  ENT: Denies discharge, congestion, post nasal drip, epistaxis, sore throat, earache, hearing loss, dental pain, Tinnitus, Vertigo, Sinus pain or snoring.  Cardio: Denies chest pain, palpitations, irregular heartbeat, syncope, dyspnea, diaphoresis, orthopnea, PND, claudication or edema Respiratory: denies cough, dyspnea, DOE, pleurisy, hoarseness, laryngitis or wheezing.  Gastrointestinal: Denies dysphagia, heartburn, reflux, water brash, pain, cramps, nausea, vomiting, bloating, diarrhea, constipation, hematemesis, melena, hematochezia, jaundice or hemorrhoids Genitourinary: Denies dysuria, frequency, discharge, hematuria or flank pain. Has urgency, nocturia x 2-3 & occasional hesitancy. Musculoskeletal: Denies arthralgia, myalgia, stiffness, Jt. Swelling, pain, limp or strain/sprain. Denies Falls. Skin: Denies puritis, rash, hives, warts, acne, eczema or change in skin lesion Neuro: No weakness, tremor, incoordination, spasms, paresthesia or pain Psychiatric: Denies confusion, memory loss or sensory loss. Denies Depression. Endocrine: Denies change in weight, skin, hair change, nocturia, and  paresthesia, diabetic polys, visual blurring or hyper / hypo glycemic episodes.  Heme/Lymph: No excessive bleeding, bruising or enlarged lymph nodes.  Physical Exam  BP 130/82   Pulse 76   Temp 97.8 F (36.6 C)   Resp 16   Ht 5' 7.75" (1.721 m)   Wt 184 lb 6.4 oz (83.6 kg)   BMI 28.25 kg/m   General Appearance: Well nourished and well groomed and in no apparent distress.  Eyes: PERRLA, EOMs, conjunctiva no swelling or erythema, normal fundi and vessels. Sinuses: No frontal/maxillary tenderness ENT/Mouth: EACs patent / TMs  nl. Nares clear without erythema, swelling, mucoid exudates. Oral hygiene is good. No erythema, swelling, or exudate. Tongue normal, non-obstructing. Tonsils not swollen or erythematous. Hearing normal.  Neck: Supple, thyroid not palpable. No bruits, nodes or JVD. Respiratory: Respiratory effort normal.  BS equal and clear bilateral without rales, rhonci, wheezing or stridor. Cardio: Heart sounds are normal with regular rate and rhythm and no murmurs, rubs or gallops. Peripheral pulses are normal and equal bilaterally without edema. No aortic or femoral bruits. Has a Left Brachial AVF with thrill & bruit.  Chest: symmetric with normal excursions and percussion.  Abdomen: Soft, with Nl bowel sounds. Nontender, no guarding, rebound, hernias, masses, or organomegaly.  Lymphatics: Non tender without lymphadenopathy.  Musculoskeletal: Full ROM all peripheral extremities, joint stability, 5/5 strength, and normal gait. Skin: Warm and dry without rashes, lesions, cyanosis, clubbing or  ecchymosis.  Neuro: Cranial nerves intact, reflexes equal bilaterally. Normal muscle tone, no cerebellar symptoms. Sensation intact to touch, vibratory and Monofilament to the toes bilaterally. Pysch: Alert and oriented X 3 with normal affect, insight and judgment appropriate.   Assessment and Plan  1. Essential hypertension  - EKG 12-Lead - [place], RETROPERITNL ABD,  LTD  2.  Hyperlipidemia, mixed  - EKG 12-Lead - [place], RETROPERITNL ABD,  LTD  3. Type 2 diabetes mellitus with stage 2 chronic kidney disease, with long-term current use of insulin (HCC)  - EKG 12-Lead - [place], RETROPERITNL ABD,  LTD  4. Vitamin D deficiency  - VITAMIN D 25 Hydroxy  5. Gout due to renal impairment  6. 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 PAF (paroxysmal atrial fibrillation) ([place])  8. Chronic systolic heart failure (HCC)  9. Renal Transplant, s/p 09/2011  10. 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 OSA on CPAP   12. Testosterone deficiency  13. BPH with obstruction/lower urinary tract symptom  14. Screening for ischemic heart disease  15. FHx: heart disease  - EKG 12-Lead - [place], RETROPERITNL ABD,  LTD  16. Former smoker  - EKG 12-Lead - [place], RETROPERITNL ABD,  LTD  17. Screening for AAA (aortic abdominal aneurysm)  - [place], RETROPERITNL ABD,  LTD  18. Screening for colorectal cancer  - POC Hemoccult Bld/Stl  19. Prostate cancer screening  20. Anticoagulant long-term use  21. Medication management       Patient was counseled in prudent diet, weight control to achieve/maintain BMI less than 25, BP monitoring, regular exercise and medications as discussed.  Discussed med effects and SE's. Routine screening labs and tests as requested with regular follow-up as recommended. Over 40 minutes of exam, counseling, chart review and high complex critical decision making was performed

## 2018-02-27 ENCOUNTER — Ambulatory Visit (INDEPENDENT_AMBULATORY_CARE_PROVIDER_SITE_OTHER): Payer: Medicare Other | Admitting: Internal Medicine

## 2018-02-27 ENCOUNTER — Encounter (HOSPITAL_COMMUNITY): Payer: Self-pay | Admitting: Internal Medicine

## 2018-02-27 ENCOUNTER — Encounter: Payer: Self-pay | Admitting: Internal Medicine

## 2018-02-27 ENCOUNTER — Emergency Department (HOSPITAL_COMMUNITY)
Admission: EM | Admit: 2018-02-27 | Discharge: 2018-02-27 | Disposition: A | Discharge status: Home / Self Care | Payer: Medicare Other | Attending: Emergency Medicine | Admitting: Emergency Medicine

## 2018-02-27 VITALS — BP 130/82 | HR 76 | Temp 97.8°F | Resp 16 | Ht 67.75 in | Wt 184.4 lb

## 2018-02-27 DIAGNOSIS — I5022 Chronic systolic (congestive) heart failure: Secondary | ICD-10-CM | POA: Insufficient documentation

## 2018-02-27 DIAGNOSIS — Z1212 Encounter for screening for malignant neoplasm of rectum: Secondary | ICD-10-CM

## 2018-02-27 DIAGNOSIS — Z7901 Long term (current) use of anticoagulants: Secondary | ICD-10-CM

## 2018-02-27 DIAGNOSIS — E119 Type 2 diabetes mellitus without complications: Secondary | ICD-10-CM | POA: Diagnosis not present

## 2018-02-27 DIAGNOSIS — M1030 Gout due to renal impairment, unspecified site: Secondary | ICD-10-CM

## 2018-02-27 DIAGNOSIS — G4733 Obstructive sleep apnea (adult) (pediatric): Secondary | ICD-10-CM

## 2018-02-27 DIAGNOSIS — E782 Mixed hyperlipidemia: Secondary | ICD-10-CM | POA: Diagnosis not present

## 2018-02-27 DIAGNOSIS — Z7982 Long term (current) use of aspirin: Secondary | ICD-10-CM | POA: Insufficient documentation

## 2018-02-27 DIAGNOSIS — Z9989 Dependence on other enabling machines and devices: Secondary | ICD-10-CM

## 2018-02-27 DIAGNOSIS — Z136 Encounter for screening for cardiovascular disorders: Secondary | ICD-10-CM

## 2018-02-27 DIAGNOSIS — E349 Endocrine disorder, unspecified: Secondary | ICD-10-CM

## 2018-02-27 DIAGNOSIS — I480 Paroxysmal atrial fibrillation: Secondary | ICD-10-CM

## 2018-02-27 DIAGNOSIS — E1122 Type 2 diabetes mellitus with diabetic chronic kidney disease: Secondary | ICD-10-CM

## 2018-02-27 DIAGNOSIS — Z87891 Personal history of nicotine dependence: Secondary | ICD-10-CM | POA: Insufficient documentation

## 2018-02-27 DIAGNOSIS — I25708 Atherosclerosis of coronary artery bypass graft(s), unspecified, with other forms of angina pectoris: Secondary | ICD-10-CM | POA: Diagnosis not present

## 2018-02-27 DIAGNOSIS — Z794 Long term (current) use of insulin: Secondary | ICD-10-CM

## 2018-02-27 DIAGNOSIS — I442 Atrioventricular block, complete: Secondary | ICD-10-CM

## 2018-02-27 DIAGNOSIS — I130 Hypertensive heart and chronic kidney disease with heart failure and stage 1 through stage 4 chronic kidney disease, or unspecified chronic kidney disease: Secondary | ICD-10-CM | POA: Insufficient documentation

## 2018-02-27 DIAGNOSIS — Z1211 Encounter for screening for malignant neoplasm of colon: Secondary | ICD-10-CM

## 2018-02-27 DIAGNOSIS — Z8249 Family history of ischemic heart disease and other diseases of the circulatory system: Secondary | ICD-10-CM

## 2018-02-27 DIAGNOSIS — I493 Ventricular premature depolarization: Secondary | ICD-10-CM | POA: Diagnosis present

## 2018-02-27 DIAGNOSIS — I10 Essential (primary) hypertension: Secondary | ICD-10-CM

## 2018-02-27 DIAGNOSIS — E559 Vitamin D deficiency, unspecified: Secondary | ICD-10-CM

## 2018-02-27 DIAGNOSIS — N182 Chronic kidney disease, stage 2 (mild): Secondary | ICD-10-CM | POA: Insufficient documentation

## 2018-02-27 DIAGNOSIS — N138 Other obstructive and reflux uropathy: Secondary | ICD-10-CM

## 2018-02-27 DIAGNOSIS — N401 Enlarged prostate with lower urinary tract symptoms: Secondary | ICD-10-CM

## 2018-02-27 DIAGNOSIS — Z940 Kidney transplant status: Secondary | ICD-10-CM

## 2018-02-27 DIAGNOSIS — Z79899 Other long term (current) drug therapy: Secondary | ICD-10-CM

## 2018-02-27 DIAGNOSIS — Z125 Encounter for screening for malignant neoplasm of prostate: Secondary | ICD-10-CM

## 2018-02-27 HISTORY — DX: Family history of ischemic heart disease and other diseases of the circulatory system: Z82.49

## 2018-02-27 HISTORY — DX: Endocrine disorder, unspecified: E34.9

## 2018-02-27 LAB — I-STAT CHEM 8, ED
BUN: 21 mg/dL (ref 8–23)
CALCIUM ION: 1.17 mmol/L (ref 1.15–1.40)
Chloride: 105 mmol/L (ref 98–111)
Creatinine, Ser: 0.9 mg/dL (ref 0.61–1.24)
GLUCOSE: 106 mg/dL — AB (ref 70–99)
HCT: 44 % (ref 39.0–52.0)
Hemoglobin: 15 g/dL (ref 13.0–17.0)
Potassium: 4.1 mmol/L (ref 3.5–5.1)
Sodium: 139 mmol/L (ref 135–145)
TCO2: 25 mmol/L (ref 22–32)

## 2018-02-27 NOTE — Discharge Instructions (Signed)
No further treatment needed.  If concern for any reason or follow-up with Dr. Melford Aase

## 2018-02-27 NOTE — Patient Instructions (Addendum)
We Do NOT Approve of  [facility], [employer]  Our Patients  To Do Home Visits  & We Do NOT Approve of [facility] [employer] > > > > > > > > > > > > > > > > > > > > > > > > > > > > > > > > > > >  > > > >  Bleeding Precautions When on Anticoagulant Therapy  WHAT IS ANTICOAGULANT THERAPY? Anticoagulant therapy is taking medicine to prevent or reduce blood clots. It is also called blood thinner therapy. Blood clots that form in your blood vessels can be dangerous. They can break loose and travel to your heart, lungs, or brain. This increases your risk of a heart attack or stroke. Anticoagulant therapy causes blood to clot more slowly. You may need anticoagulant therapy if you have:  A medical condition that increases the likelihood that blood clots will form.  A heart defect or a problem with heart rhythm. It is also a common treatment after heart surgery, such as valve replacement. WHAT ARE COMMON TYPES OF ANTICOAGULANT THERAPY? Anticoagulant medicine can be injected or taken by mouth.If you need anticoagulant therapy quickly at the hospital, the medicine may be injected under your skin or given through an IV tube. Heparin is a common example of an anticoagulant that you may get at the hospital. Most anticoagulant therapy is in the form of pills that you take at home every day. These may include:  Aspirin. This common blood thinner works by preventing blood cells (platelets) from sticking together to form a clot. Aspirin is not as strong as anticoagulants that slow down the time that it takes for your body to form a clot.  Clopidogrel. This is a newer type of drug that affects platelets. It is stronger than aspirin.  Warfarin. This is the most common anticoagulant. It changes the way your body uses vitamin K, a vitamin that helps your blood to clot. The risk of bleeding is higher with warfarin than with aspirin. You will need frequent blood tests to make sure you are taking  the safest amount.  New anticoagulants. Several new drugs have been approved. They are all taken by mouth. Studies show that these drugs work as well as warfarin. They do not require blood testing. They may cause less bleeding risk than warfarin. WHAT DO I NEED TO REMEMBER WHEN TAKING ANTICOAGULANT THERAPY? Anticoagulant therapy decreases your risk of forming a blood clot, but it increases your risk of bleeding. Work closely with your health care provider to make sure you are taking your medicine safely. These tips can help:  Learn ways to reduce your risk of bleeding.  If you are taking warfarin: ? Have blood tests as ordered by your health care provider. ? Do not make any sudden changes to your diet. Vitamin K in your diet can make warfarin less effective. ? Do not get pregnant. This medicine may cause birth defects.  Take your medicine at the same time every day. If you forget to take your medicine, take it as soon as you remember. If you miss a whole day, do not double your dose of medicine. Take your normal dose and call your health care provider to check in.  Do not stop taking your medicine on your own.  Tell your health care provider before you start taking any new medicine, vitamin, or herbal product. Some of these could interfere with your therapy.  Tell all of your health care providers that  you are on anticoagulant therapy.  Do not have surgery, medical procedures, or dental work until you tell your health care provider that you are on anticoagulant therapy. WHAT CAN AFFECT HOW ANTICOAGULANTS WORK? Certain foods, vitamins, medicines, supplements, and herbal medicines change the way that anticoagulant therapy works. They may increase or decrease the effects of your anticoagulant therapy. Either result can be dangerous for you.  Many over-the-counter medicines for pain, colds, or stomach problems interfere with anticoagulant therapy. Take these only as told by your health care  provider.  Do not drink alcohol. It can interfere with your medicine and increase your risk of an injury that causes bleeding.  If you are taking warfarin, do not begin eating more foods that contain vitamin K. These include leafy green vegetables. Ask your health care provider if you should avoid any foods. WHAT ARE SOME WAYS TO PREVENT BLEEDING? You can prevent bleeding by taking certain precautions:  Be extra careful when you use knives, scissors, or other sharp objects.  Use an electric razor instead of a blade.  Do not use toothpicks.  Use a soft toothbrush.  Wear shoes that have nonskid soles.  Use bath mats and handrails in your bathroom.  Wear gloves while you do yard work.  Wear a helmet when you ride a bike.  Wear your seat belt.  Prevent falls by removing loose rugs and extension cords from areas where you walk.  Do not play contact sports or participate in other activities that have a high risk of injury. [place] PROVIDER? Call your health care provider if:  You miss a dose of medicine: ? And you are not sure what to do. ? For more than one day.  You have: ? Menstrual bleeding that is heavier than normal. ? Blood in your urine. ? A bloody nose or bleeding gums. ? Easy bruising. ? Blood in your stool (feces) or have black and tarry stool. ? Side effects from your medicine.  You feel weak or dizzy.  You become pregnant. Seek immediate medical care if:  You have bleeding that will not stop.  You have sudden and severe headache or belly pain.  You vomit or you cough up bright red blood.  You have a severe blow to your head. WHAT ARE SOME QUESTIONS TO ASK MY HEALTH CARE PROVIDER?  What is the best anticoagulant therapy for my condition?  What side effects should I watch for?  When should I take my medicine? What should I do if I forget to take it?  Will I need to have regular blood tests?  Do I need to change my  diet? Are there foods or drinks that I should avoid?  What activities are safe for me?  What should I do if I want to get pregnant? This information is not intended to replace advice given to you by your health care provider. Make sure you discuss any questions you have with your health care provider. Document Released: 03/06/2015 Document Reviewed: 03/06/2015 Elsevier Interactive Patient Education  2017 [employer] for Adults  A healthy lifestyle and preventive care can promote health and wellness. Preventive health guidelines for men include the following key practices:  A routine yearly physical is a good way to check with your health care provider about your health and preventative screening. It is a chance to share any concerns and updates on your health and to receive a thorough exam.  Visit your  dentist for a routine exam and preventative care every 6 months. Brush your teeth twice a day and floss once a day. Good oral hygiene prevents tooth decay and gum disease.  The frequency of eye exams is based on your age, health, family medical history, use of contact lenses, and other factors. Follow your health care provider's recommendations for frequency of eye exams.  Eat a healthy diet. Foods such as vegetables, fruits, whole grains, low-fat dairy products, and lean protein foods contain the nutrients you need without too many calories. Decrease your intake of foods high in solid fats, added sugars, and salt. Eat the right amount of calories for you. Get information about a proper diet from your health care provider, if necessary.  Regular physical exercise is one of the most important things you can do for your health. Most adults should get at least 150 minutes of moderate-intensity exercise (any activity that increases your heart rate and causes you to sweat) each week. In addition, most adults need muscle-strengthening exercises on 2 or more days a week.  Maintain  a healthy weight. The body mass index (BMI) is a screening tool to identify possible weight problems. It provides an estimate of body fat based on height and weight. Your health care provider can find your BMI and can help you achieve or maintain a healthy weight. For adults 20 years and older:  A BMI below 18.5 is considered underweight.  A BMI of 18.5 to 24.9 is normal.  A BMI of 25 to 29.9 is considered overweight.  A BMI of 30 and above is considered obese.  Maintain normal blood lipids and cholesterol levels by exercising and minimizing your intake of saturated fat. Eat a balanced diet with plenty of fruit and vegetables. Blood tests for lipids and cholesterol should begin at age 80 and be repeated every 5 years. If your lipid or cholesterol levels are high, you are over 50, or you are at high risk for heart disease, you may need your cholesterol levels checked more frequently. Ongoing high lipid and cholesterol levels should be treated with medicines if diet and exercise are not working.  If you smoke, find out from your health care provider how to quit. If you do not use tobacco, do not start.  Lung cancer screening is recommended for adults aged 43-80 years who are at high risk for developing lung cancer because of a history of smoking. A yearly low-dose CT scan of the lungs is recommended for people who have at least a 30-pack-year history of smoking and are a current smoker or have quit within the past 15 years. A pack year of smoking is smoking an average of 1 pack of cigarettes a day for 1 year (for example: 1 pack a day for 30 years or 2 packs a day for 15 years). Yearly screening should continue until the smoker has stopped smoking for at least 15 years. Yearly screening should be stopped for people who develop a health problem that would prevent them from having lung cancer treatment.  If you choose to drink alcohol, do not have more than 2 drinks per day. One drink is considered to be  12 ounces (355 mL) of beer, 5 ounces (148 mL) of wine, or 1.5 ounces (44 mL) of liquor.  Avoid use of street drugs. Do not share needles with anyone. Ask for help if you need support or instructions about stopping the use of drugs.  High blood pressure causes heart disease and increases  the risk of stroke. Your blood pressure should be checked at least every 1-2 years. Ongoing high blood pressure should be treated with medicines, if weight loss and exercise are not effective.  If you are 40-27 years old, ask your health care provider if you should take aspirin to prevent heart disease.  Diabetes screening involves taking a blood sample to check your fasting blood sugar level. Testing should be considered at a younger age or be carried out more frequently if you are overweight and have at least 1 risk factor for diabetes.  Colorectal cancer can be detected and often prevented. Most routine colorectal cancer screening begins at the age of 4 and continues through age 33. However, your health care provider may recommend screening at an earlier age if you have risk factors for colon cancer. On a yearly basis, your health care provider may provide home test kits to check for hidden blood in the stool. Use of a small camera at the end of a tube to directly examine the colon (sigmoidoscopy or colonoscopy) can detect the earliest forms of colorectal cancer. Talk to your health care provider about this at age 41, when routine screening begins. Direct exam of the colon should be repeated every 5-10 years through age 25, unless early forms of precancerous polyps or small growths are found.  Hepatitis C blood testing is recommended for all people born from [birth year] through 1965 and any individual with known risks for hepatitis C.  Screening for abdominal aortic aneurysm (AAA)  by ultrasound is recommended for people who have history of high blood pressure or who are current or former smokers.  Healthy men should   receive prostate-specific antigen (PSA) blood tests as part of routine cancer screening. Talk with your health care provider about prostate cancer screening.  Testicular cancer screening is  recommended for adult males. Screening includes self-exam, a health care provider exam, and other screening tests. Consult with your health care provider about any symptoms you have or any concerns you have about testicular cancer.  Use sunscreen. Apply sunscreen liberally and repeatedly throughout the day. You should seek shade when your shadow is shorter than you. Protect yourself by wearing long sleeves, pants, a wide-brimmed hat, and sunglasses year round, whenever you are outdoors.  Once a month, do a whole-body skin exam, using a mirror to look at the skin on your back. Tell your health care provider about new moles, moles that have irregular borders, moles that are larger than a pencil eraser, or moles that have changed in shape or color.  Stay current with required vaccines (immunizations).  Influenza vaccine. All adults should be immunized every year.  Tetanus, diphtheria, and acellular pertussis (Td, Tdap) vaccine. An adult who has not previously received Tdap or who does not know his vaccine status should receive 1 dose of Tdap. This initial dose should be followed by tetanus and diphtheria toxoids (Td) booster doses every 10 years. Adults with an unknown or incomplete history of completing a 3-dose immunization series with Td-containing vaccines should begin or complete a primary immunization series including a Tdap dose. Adults should receive a Td booster every 10 years.  Zoster vaccine. One dose is recommended for adults aged 28 years or older unless certain conditions are present.    PREVNAR - Pneumococcal 13-valent conjugate (PCV13) vaccine. When indicated, a person who is uncertain of his immunization history and has no record of immunization should receive the PCV13 vaccine. An adult aged 87  years or older  who has certain medical conditions and has not been previously immunized should receive 1 dose of PCV13 vaccine. This PCV13 should be followed with a dose of pneumococcal polysaccharide (PPSV23) vaccine. The PPSV23 vaccine dose should be obtained 1 or more year(s)after the dose of PCV13 vaccine. An adult aged 39 years or older who has certain medical conditions and previously received 1 or more doses of PPSV23 vaccine should receive 1 dose of PCV13. The PCV13 vaccine dose should be obtained 1 or more years after the last PPSV23 vaccine dose.    PNEUMOVAX - Pneumococcal polysaccharide (PPSV23) vaccine. When PCV13 is also indicated, PCV13 should be obtained first. All adults aged 66 years and older should be immunized. An adult younger than age 49 years who has certain medical conditions should be immunized. Any person who resides in a nursing home or long-term care facility should be immunized. An adult smoker should be immunized. People with an immunocompromised condition and certain other conditions should receive both PCV13 and PPSV23 vaccines. People with human immunodeficiency virus (HIV) infection should be immunized as soon as possible after diagnosis. Immunization during chemotherapy or radiation therapy should be avoided. Routine use of PPSV23 vaccine is not recommended for American Indians, [place] Natives, or people younger than 65 years unless there are medical conditions that require PPSV23 vaccine. When indicated, people who have unknown immunization and have no record of immunization should receive PPSV23 vaccine. One-time revaccination 5 years after the first dose of PPSV23 is recommended for people aged 19-64 years who have chronic kidney failure, nephrotic syndrome, asplenia, or immunocompromised conditions. People who received 1-2 doses of PPSV23 before age 68 years should receive another dose of PPSV23 vaccine at age 53 years or later if at least 5 years have passed since the  previous dose. Doses of PPSV23 are not needed for people immunized with PPSV23 at or after age 24 years.    Hepatitis A vaccine. Adults who wish to be protected from this disease, have certain high-risk conditions, work with hepatitis A-infected animals, work in hepatitis A research labs, or travel to or work in countries with a high rate of hepatitis A should be immunized. Adults who were previously unvaccinated and who anticipate close contact with an international adoptee during the first 60 days after arrival in the [place] from a country with a high rate of hepatitis A should be immunized.    Hepatitis B vaccine. Adults should be immunized if they wish to be protected from this disease, have certain high-risk conditions, may be exposed to blood or other infectious body fluids, are household contacts or sex partners of hepatitis B positive people, are clients or workers in certain care facilities, or travel to or work in countries with a high rate of hepatitis B.   Preventive Service / Frequency   Ages 64 and over  Blood pressure check.  Lipid and cholesterol check.  Lung cancer screening. / Every year if you are aged 38-80 years and have a 30-pack-year history of smoking and currently smoke or have quit within the past 15 years. Yearly screening is stopped once you have quit smoking for at least 15 years or develop a health problem that would prevent you from having lung cancer treatment.  Fecal occult blood test (FOBT) of stool. You may not have to do this test if you get a colonoscopy every 10 years.  Flexible sigmoidoscopy** or colonoscopy.** / Every 5 years for a flexible sigmoidoscopy or every 10 years for a colonoscopy beginning  at age 61 and continuing until age 79.  Hepatitis C blood test.** / For all people born from [birth year] through 1963 and any individual with known risks for hepatitis C.  Abdominal aortic aneurysm (AAA) screening./ Screening current or former smokers or  have Hypertension.  Skin self-exam. / Monthly.  Influenza vaccine. / Every year.  Tetanus, diphtheria, and acellular pertussis (Tdap/Td) vaccine.** / 1 dose of Td every 10 years.   Zoster vaccine.** / 1 dose for adults aged 18 years or older.         Pneumococcal 13-valent conjugate (PCV13) vaccine.    Pneumococcal polysaccharide (PPSV23) vaccine.     Hepatitis A vaccine.** / Consult your health care provider.  Hepatitis B vaccine.** / Consult your health care provider. Screening for abdominal aortic aneurysm (AAA)  by ultrasound is recommended for people who have history of high blood pressure or who are current or former smokers. ++++++++++ Recommend Adult Low Dose Aspirin or  coated  Aspirin 81 mg daily  To reduce risk of Colon Cancer 20 %,  Skin Cancer 26 % ,  Malignant Melanoma 46%  and  Pancreatic cancer 60% ++++++++++++++++++++++ Vitamin D goal  is between 70-100.  Please make sure that you are taking your Vitamin D as directed.  It is very important as a natural anti-inflammatory  helping hair, skin, and nails, as well as reducing stroke and heart attack risk.  It helps your bones and helps with mood. It also decreases numerous cancer risks so please take it as directed.  Low Vit D is associated with a 200-300% higher risk for CANCER  and 200-300% higher risk for HEART   ATTACK  &  STROKE.   .....................................[place] Kitchen It is also associated with higher death rate at younger ages,  autoimmune diseases like Rheumatoid arthritis, Lupus, Multiple Sclerosis.    Also many other serious conditions, like depression, Alzheimer's Dementia, infertility, muscle aches, fatigue, fibromyalgia - just to name a few. ++++++++++++++++++++++ Recommend the book "The END of DIETING" by Dr Excell Seltzer  & the book "The END of DIABETES " by Dr Excell Seltzer At [email or web address] - get book & Audio CD's    Being diabetic has a  300% increased risk for heart attack, stroke, cancer,  and alzheimer- type vascular dementia. It is very important that you work harder with diet by avoiding all foods that are white. Avoid white rice (brown & wild rice is OK), white potatoes (sweetpotatoes in moderation is OK), White bread or wheat bread or anything made out of white flour like bagels, donuts, rolls, buns, biscuits, cakes, pastries, cookies, pizza crust, and pasta (made from white flour & egg whites) - vegetarian pasta or spinach or wheat pasta is OK. Multigrain breads like Arnold's or [employer], or multigrain sandwich thins or flatbreads.  Diet, exercise and weight loss can reverse and cure diabetes in the early stages.  Diet, exercise and weight loss is very important in the control and prevention of complications of diabetes which affects every system in your body, ie. Brain - dementia/stroke, eyes - glaucoma/blindness, heart - heart attack/heart failure, kidneys - dialysis, stomach - gastric paralysis, intestines - malabsorption, nerves - severe painful neuritis, circulation - gangrene & loss of a leg(s), and finally cancer and Alzheimers.    I recommend avoid fried & greasy foods,  sweets/candy, white rice (brown or wild rice or Quinoa is OK), white potatoes (sweet potatoes are OK) - anything made from white flour - bagels, doughnuts, rolls, buns, biscuits,white and wheat  breads, pizza crust and traditional pasta made of white flour & egg white(vegetarian pasta or spinach or wheat pasta is OK).  Multi-grain bread is OK - like multi-grain flat bread or sandwich thins. Avoid alcohol in excess. Exercise is also important.    Eat all the vegetables you want - avoid meat, especially red meat and dairy - especially cheese.  Cheese is the most concentrated form of trans-fats which is the worst thing to clog up our arteries. Veggie cheese is OK which can be found in the fresh produce section at [employer] or [employer] or [employer]  ++++++++++++++++++++++ DASH Eating Plan  DASH stands  for "Dietary Approaches to Stop Hypertension."   The DASH eating plan is a healthy eating plan that has been shown to reduce high blood pressure (hypertension). Additional health benefits may include reducing the risk of type 2 diabetes mellitus, heart disease, and stroke. The DASH eating plan may also help with weight loss. WHAT DO I NEED TO KNOW ABOUT THE DASH EATING PLAN? For the DASH eating plan, you will follow these general guidelines:  Choose foods with a percent daily value for sodium of less than 5% (as listed on the food label).  Use salt-free seasonings or herbs instead of table salt or sea salt.  Check with your health care provider or pharmacist before using salt substitutes.  Eat lower-sodium products, often labeled as "lower sodium" or "no salt added."  Eat fresh foods.  Eat more vegetables, fruits, and low-fat dairy products.  Choose whole grains. Look for the word "whole" as the first word in the ingredient list.  Choose fish   Limit sweets, desserts, sugars, and sugary drinks.  Choose heart-healthy fats.  Eat veggie cheese   Eat more home-cooked food and less restaurant, buffet, and fast food.  Limit fried foods.  Cook foods using methods other than frying.  Limit canned vegetables. If you do use them, rinse them well to decrease the sodium.  When eating at a restaurant, ask that your food be prepared with less salt, or no salt if possible.                      WHAT FOODS CAN I EAT? Read Dr Fara Olden Fuhrman's books on The End of Dieting & The End of Diabetes  Grains Whole grain or whole wheat bread. Brown rice. Whole grain or whole wheat pasta. Quinoa, bulgur, and whole grain cereals. Low-sodium cereals. Corn or whole wheat flour tortillas. Whole grain cornbread. Whole grain crackers. Low-sodium crackers.  Vegetables Fresh or frozen vegetables (raw, steamed, roasted, or grilled). Low-sodium or reduced-sodium tomato and vegetable juices. Low-sodium or  reduced-sodium tomato sauce and paste. Low-sodium or reduced-sodium canned vegetables.   Fruits All fresh, canned (in natural juice), or frozen fruits.  Protein Products  All fish and seafood.  Dried beans, peas, or lentils. Unsalted nuts and seeds. Unsalted canned beans.  Dairy Low-fat dairy products, such as skim or 1% milk, 2% or reduced-fat cheeses, low-fat ricotta or cottage cheese, or plain low-fat yogurt. Low-sodium or reduced-sodium cheeses.  Fats and Oils Tub margarines without trans fats. Light or reduced-fat mayonnaise and salad dressings (reduced sodium). Avocado. Safflower, olive, or canola oils. Natural peanut or almond butter.  Other Unsalted popcorn and pretzels. The items listed above may not be a complete list of recommended foods or beverages. Contact your dietitian for more options.  ++++++++++++++++++++  WHAT FOODS ARE NOT RECOMMENDED? Grains/ White flour or wheat flour White bread. White  pasta. White rice. Refined cornbread. Bagels and croissants. Crackers that contain trans fat.  Vegetables  Creamed or fried vegetables. Vegetables in a . Regular canned vegetables. Regular canned tomato sauce and paste. Regular tomato and vegetable juices.  Fruits Dried fruits. Canned fruit in light or heavy syrup. Fruit juice.  Meat and Other Protein Products Meat in general - RED meat & White meat.  Fatty cuts of meat. Ribs, chicken wings, all processed meats as bacon, sausage, bologna, salami, fatback, hot dogs, bratwurst and packaged luncheon meats.  Dairy Whole or 2% milk, cream, half-and-half, and cream cheese. Whole-fat or sweetened yogurt. Full-fat cheeses or blue cheese. Non-dairy creamers and whipped toppings. Processed cheese, cheese spreads, or cheese curds.  Condiments Onion and garlic salt, seasoned salt, table salt, and sea salt. Canned and packaged gravies. Worcestershire sauce. Tartar sauce. Barbecue sauce. Teriyaki sauce. Soy sauce, including reduced  sodium. Steak sauce. Fish sauce. Oyster sauce. Cocktail sauce. Horseradish. Ketchup and mustard. Meat flavorings and tenderizers. Bouillon cubes. Hot sauce. Tabasco sauce. Marinades. Taco seasonings. Relishes.  Fats and Oils Butter, stick margarine, lard, shortening and bacon fat. Coconut, palm kernel, or palm oils. Regular salad dressings.  Pickles and olives. Salted popcorn and pretzels.  The items listed above may not be a complete list of foods and beverages to avoid.

## 2018-02-27 NOTE — ED Notes (Signed)
Patient verbalizes understanding of discharge instructions. Opportunity for questioning and answers were provided. Armband removed by staff, pt discharged from ED.  

## 2018-02-27 NOTE — ED Provider Notes (Signed)
[facility]ERGENCY DEPARTMENT Provider Note   CSN: [id number] Arrival date & time: 02/27/18  1042     History   Chief Complaint Chief Complaint  Patient presents with  . Abnormal ECG    HPI Robert Arnold is a 72 y.o. male.  Patient saw Dr.Mckeown this morning for his annual routine physical exam.  He is completely asymptomatic.  In performing a routine EKG, EKG abnormality was noted, namely PVCs.  Patient denies any chest pain shortness of breath nausea sweatiness lightheadedness or other symptoms.  No treatment prior to coming here.  He was sent here for further evaluation by EMS  HPI  Past Medical History:  Diagnosis Date  . Adenomatous colon polyp   . Allergy   . BPH (benign prostatic hyperplasia)   . CHF (congestive heart failure) ([place])   . 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 Diabetes mellitus type 2, controlled ([place])   . Glaucoma   . Gout   . 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 MI (myocardial infarction) ([place]) 09/26/2011  . NSTEMI (non-ST elevated myocardial infarction) ([place]) 11/26/2013   2013   . OSA (obstructive sleep apnea)   . PAF (paroxysmal atrial fibrillation) ([place])    s/p DC-CV in 6/13. Off coumadin due to ureteral bleed    Patient Active Problem List   Diagnosis Date Noted  . Type 2 diabetes mellitus with stage 2 chronic kidney disease, with long-term current use of insulin ([place]) 02/27/2018  . Testosterone deficiency 02/27/2018  . FHx: heart disease 02/27/2018  . Former smoker 02/27/2018  . Senile purpura ([place]) 01/23/2018  . Overweight (BMI 25.0-29.9) 08/28/2017  . Essential tremor 04/03/2017  . 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  . CAD (coronary artery disease) of artery bypass graft 01/11/2016  . Thrombocytopenia ([place]) 09/26/2015  . Depression, major, in remission ([place]) 06/29/2014  . Renal Transplant, s/p 09/2011 11/26/2013  . Vitamin D deficiency 08/17/2013  .  Screening for colorectal cancer 08/17/2013  . Anticoagulant long-term use 08/17/2013  . CKD stage 2 due to type 2 diabetes mellitus ([place])   . Hyperlipidemia, mixed   . BPH with obstruction/lower urinary tract symptoms   . Gout   . PAF (paroxysmal atrial fibrillation) ([place])   . OSA (obstructive sleep apnea)   . Histoplasmosis   . Essential hypertension   . Chronic systolic heart failure ([place]) 11/30/2011    Past Surgical History:  Procedure Laterality Date  . AV FISTULA PLACEMENT  2011   Left forearm  . COLONOSCOPY    . CORONARY ANGIOPLASTY WITH STENT PLACEMENT    . CORONARY ARTERY BYPASS GRAFT  2013  . KIDNEY TRANSPLANT  09/25/2011  . PERITONEAL CATHETER INSERTION  2011        Home Medications    Prior to Admission medications   Medication Sig Start Date End Date Taking? Authorizing Provider  allopurinol (ZYLOPRIM) 300 MG tablet TAKE 1 TABLET BY MOUTH DAILY FOR GOUT PREVENTION 11/19/17   [name], MD  aspirin EC 81 MG tablet Take 81 mg by mouth daily.    [provider]  atorvastatin (LIPITOR) 80 MG tablet Take 1/2 to 1 tablet daily or as directed for cholesterol 12/31/17 02/09/20  [name], MD  betamethasone dipropionate (DIPROLENE) 0.05 % cream Apply topically.    [provider]  Black Pepper-Turmeric (TURMERIC COMPLEX/BLACK PEPPER PO) Take 1,500 mg by mouth daily.    [provider]  buPROPion (  WELLBUTRIN XL) 300 MG 24 hr tablet TAKE ONE TABLET BY MOUTH EVERY MORNING 02/25/18   [name], MD  Cholecalciferol (VITAMIN D PO) Take 1,000 Int'l Units by mouth daily.     [provider]  finasteride (PROSCAR) 5 MG tablet TAKE 1 TABLET BY MOUTH ONCE DAILY 01/01/18   [name], MD  glucose blood (FREESTYLE TEST STRIPS) test strip Check blood sugar 3 to 4 times daily for medication regulation. 12/04/15   [name], MD  insulin NPH-regular Human (NOVOLIN 70/30) (70-30) 100 UNIT/ML injection Take 20-30 units 2 x day  or as directed 02/12/14   [name], MD  latanoprost (XALATAN) 0.005 % ophthalmic solution INT 1 GTT IN [facility] QHS 04/11/16   [provider]  MAGNESIUM-OXIDE 400 (241.3 Mg) MG tablet TK 1 T PO BID 12/29/15   [provider]  mycophenolate (MYFORTIC) 360 MG TBEC EC tablet TAKE 1 TABLET BY MOUTH TWICE DAILY 05/06/16   [name], MD  OVER THE COUNTER MEDICATION Taking 500 mg OTC Magnesium daily in addition to Magnesium oxide 400 mg BID(RX)    [provider]  tacrolimus (PROGRAF) 0.5 MG capsule Take 0.5 mg by mouth 2 (two) times daily.    [provider]  tacrolimus (PROGRAF) 1 MG capsule Take 1 mg by mouth 2 (two) times daily.    [provider]  warfarin (COUMADIN) 5 MG tablet TAKE 1 TO 2 TABLETS BY MOUTH ONCE DAILY AS DIRECTED 06/16/17   [name], MD  Zinc 50 MG CAPS Take 50 mg by mouth daily.    [provider]    Family History Family History  Problem Relation Age of Onset  . Heart attack Father   . Diabetes Mother   . COPD Mother   . Kidney disease Sister   . Colon cancer Neg Hx   . Stomach cancer Neg Hx   . Rectal cancer Neg Hx   . Esophageal cancer Neg Hx   . Liver cancer Neg Hx     Social History Social History   Tobacco Use  . Smoking status: Former Smoker    Last attempt to quit: 11/29/1990    Years since quitting: 27.2  . Smokeless tobacco: Never Used  Substance Use Topics  . Alcohol use: Yes    Comment: 1-2 a month  . Drug use: No     Allergies   Crestor [rosuvastatin]; Lorazepam; and Losartan   Review of Systems Review of Systems  Allergic/Immunologic: Positive for immunocompromised state.       Transplant patient  All other systems reviewed and are negative.    Physical Exam Updated Vital Signs Ht 5\' 8"  (1.727 m)   Wt 83.5 kg   BMI 27.98 kg/m   Physical Exam  Constitutional: He appears well-developed and well-nourished.  HENT:  Head: Normocephalic and atraumatic.  Eyes:  Pupils are equal, round, and reactive to light. Conjunctivae are normal.  Neck: Neck supple. No tracheal deviation present. No thyromegaly present.  Cardiovascular: Normal rate and regular rhythm.  Murmur heard. Systolic murmur grade 2/6 left sternal border radiating to right neck  Pulmonary/Chest: Effort normal and breath sounds normal.  Abdominal: Soft. Bowel sounds are normal. He exhibits no distension. There is no tenderness.  Musculoskeletal: Normal range of motion. He exhibits no edema or tenderness.  Neurological: He is alert. Coordination normal.  Skin: Skin is warm and dry. No rash noted.  Psychiatric: He has a normal mood and affect.  Nursing note and vitals reviewed.  ED Treatments / Results  Labs (all labs ordered are listed, but only abnormal results are displayed) Labs Reviewed  I-STAT CHEM 8, ED    EKG None ED ECG REPORT   Date: 02/27/2018  Rate: 75  Rhythm: normal sinus rhythm and First-degree AV block PVCs  QRS Axis: normal  Intervals: PR prolonged  ST/T Wave abnormalities: nonspecific T wave changes  Conduction Disutrbances:none  Narrative Interpretation:   Old EKG Reviewed: unchanged No change from 01/31/2017 I have personally reviewed the EKG tracing and agree with the computerized printout as noted. Radiology No results found.  Procedures Procedures (including critical care time)  Medications Ordered in ED Medications - No data to display Results for orders placed or performed during the hospital encounter of 02/27/18  I-stat chem 8, ed  Result Value Ref Range   Sodium 139 135 - 145 mmol/L   Potassium 4.1 3.5 - 5.1 mmol/L   Chloride 105 98 - 111 mmol/L   BUN 21 8 - 23 mg/dL   Creatinine, Ser 0.90 0.61 - 1.24 mg/dL   Glucose, Bld 106 (H) 70 - 99 mg/dL   Calcium, Ion 1.17 1.15 - 1.40 mmol/L   TCO2 25 22 - 32 mmol/L   Hemoglobin 15.0 13.0 - 17.0 g/dL   HCT 44.0 39.0 - 52.0 %   No results found.  Initial Impression / Assessment and Plan  / ED Course  I have reviewed the triage vital signs and the nursing notes.  Pertinent labs & imaging results that were available during my care of the patient were reviewed by me and considered in my medical decision making (see chart for details).     12:20 PM patient remains asymptomatic.  No further treatment or evaluation needed.  Lab work normal.  Final Clinical Impressions(s) / ED Diagnoses  Diagnosis premature ventricular contractions Final diagnoses:  None    ED Discharge Orders    None       Orlie Dakin, MD 02/27/18 1227

## 2018-02-27 NOTE — ED Triage Notes (Signed)
Pt here via [facility] from doctors office. Pt was at annual physical when his EKG showed bigeminy. Pt has no complaints. Denies CP, SOB, nausea, dizziness. VSS at this time. Hx CABG in 2013.

## 2018-02-28 NOTE — Addendum Note (Signed)
Addended by: [name] on: 02/28/2018 09:14 PM   Modules accepted: Orders

## 2018-03-01 ENCOUNTER — Other Ambulatory Visit: Payer: Self-pay | Admitting: Internal Medicine

## 2018-03-01 DIAGNOSIS — Z7901 Long term (current) use of anticoagulants: Secondary | ICD-10-CM

## 2018-03-01 DIAGNOSIS — I480 Paroxysmal atrial fibrillation: Secondary | ICD-10-CM

## 2018-03-03 ENCOUNTER — Ambulatory Visit (INDEPENDENT_AMBULATORY_CARE_PROVIDER_SITE_OTHER): Payer: Medicare Other

## 2018-03-03 DIAGNOSIS — Z794 Long term (current) use of insulin: Secondary | ICD-10-CM

## 2018-03-03 DIAGNOSIS — Z125 Encounter for screening for malignant neoplasm of prostate: Secondary | ICD-10-CM

## 2018-03-03 DIAGNOSIS — Z7901 Long term (current) use of anticoagulants: Secondary | ICD-10-CM

## 2018-03-03 DIAGNOSIS — N182 Chronic kidney disease, stage 2 (mild): Secondary | ICD-10-CM

## 2018-03-03 DIAGNOSIS — E1122 Type 2 diabetes mellitus with diabetic chronic kidney disease: Secondary | ICD-10-CM

## 2018-03-03 DIAGNOSIS — I480 Paroxysmal atrial fibrillation: Secondary | ICD-10-CM | POA: Diagnosis not present

## 2018-03-03 DIAGNOSIS — Z79899 Other long term (current) drug therapy: Secondary | ICD-10-CM

## 2018-03-03 DIAGNOSIS — E559 Vitamin D deficiency, unspecified: Secondary | ICD-10-CM

## 2018-03-03 DIAGNOSIS — I10 Essential (primary) hypertension: Secondary | ICD-10-CM

## 2018-03-03 DIAGNOSIS — N401 Enlarged prostate with lower urinary tract symptoms: Secondary | ICD-10-CM

## 2018-03-03 DIAGNOSIS — N138 Other obstructive and reflux uropathy: Secondary | ICD-10-CM

## 2018-03-03 DIAGNOSIS — Z940 Kidney transplant status: Secondary | ICD-10-CM

## 2018-03-03 DIAGNOSIS — M1030 Gout due to renal impairment, unspecified site: Secondary | ICD-10-CM

## 2018-03-03 DIAGNOSIS — E782 Mixed hyperlipidemia: Secondary | ICD-10-CM

## 2018-03-03 NOTE — Progress Notes (Signed)
Reports for LAB----which were already in Epic at the time of the visit.  Reports meds    As follows  COUMADIN 5mg s FIVE DAYS a wk & 2 and half mgs 2 days a wk at this time. Which he reports has not changed since being seen in office last.  Reports  NO concerns  at this time.   Vitals taken at intake & entered. After that check out sheet was placed in the door for patient to be called back to have bld work done.  Pt unable to VOID at this time so he will bring URINE back at a later date per pt, he reports he used restroom before coming to Dr's office.

## 2018-03-04 LAB — HEMOGLOBIN A1C
HEMOGLOBIN A1C: 5.5 %{Hb} (ref <5.7)
Mean Plasma Glucose: 111 (calc)
eAG (mmol/L): 6.2 (calc)

## 2018-03-04 LAB — PROTIME-INR
INR: 2.1 — AB
Prothrombin Time: 20.5 s — ABNORMAL HIGH (ref 9.0–11.5)

## 2018-03-04 LAB — CBC WITH DIFFERENTIAL/PLATELET
BASOS ABS: 48 {cells}/uL (ref 0–200)
Basophils Relative: 0.9 %
Eosinophils Absolute: 48 cells/uL (ref 15–500)
Eosinophils Relative: 0.9 %
HEMATOCRIT: 43.8 % (ref 38.5–50.0)
Hemoglobin: 15.2 g/dL (ref 13.2–17.1)
Lymphs Abs: 917 cells/uL (ref 850–3900)
MCH: 31.9 pg (ref 27.0–33.0)
MCHC: 34.7 g/dL (ref 32.0–36.0)
MCV: 92 fL (ref 80.0–100.0)
MPV: 9.9 fL (ref 7.5–12.5)
Monocytes Relative: 5.1 %
NEUTROS PCT: 75.8 %
Neutro Abs: 4017 cells/uL (ref 1500–7800)
PLATELETS: 170 10*3/uL (ref 140–400)
RBC: 4.76 10*6/uL (ref 4.20–5.80)
RDW: 12.2 % (ref 11.0–15.0)
TOTAL LYMPHOCYTE: 17.3 %
WBC: 5.3 10*3/uL (ref 3.8–10.8)
WBCMIX: 270 {cells}/uL (ref 200–950)

## 2018-03-04 LAB — COMPLETE METABOLIC PANEL WITH GFR
AG RATIO: 1.5 (calc) (ref 1.0–2.5)
ALT: 23 U/L (ref 9–46)
AST: 27 U/L (ref 10–35)
Albumin: 4.1 g/dL (ref 3.6–5.1)
Alkaline phosphatase (APISO): 105 U/L (ref 40–115)
BUN: 18 mg/dL (ref 7–25)
CALCIUM: 10.1 mg/dL (ref 8.6–10.3)
CO2: 28 mmol/L (ref 20–32)
Chloride: 103 mmol/L (ref 98–110)
Creat: 1.12 mg/dL (ref 0.70–1.25)
GFR, EST AFRICAN AMERICAN: 77 mL/min/{1.73_m2} (ref >=60)
GFR, EST NON AFRICAN AMERICAN: 67 mL/min/{1.73_m2} (ref >=60)
Globulin: 2.7 g/dL (calc) (ref 1.9–3.7)
Glucose, Bld: 105 mg/dL — ABNORMAL HIGH (ref 65–99)
Potassium: 4.6 mmol/L (ref 3.5–5.3)
Sodium: 139 mmol/L (ref 135–146)
TOTAL PROTEIN: 6.8 g/dL (ref 6.1–8.1)
Total Bilirubin: 1.1 mg/dL (ref 0.2–1.2)

## 2018-03-04 LAB — VITAMIN D 25 HYDROXY (VIT D DEFICIENCY, FRACTURES): VIT D 25 HYDROXY: 114 ng/mL — AB (ref 30–100)

## 2018-03-04 LAB — LIPID PANEL
CHOL/HDL RATIO: 2.2 (calc) (ref <5.0)
Cholesterol: 143 mg/dL (ref <200)
HDL: 65 mg/dL (ref >40)
LDL Cholesterol (Calc): 58 mg/dL (calc)
NON-HDL CHOLESTEROL (CALC): 78 mg/dL (ref <130)
Triglycerides: 118 mg/dL (ref <150)

## 2018-03-04 LAB — PSA: PSA: <0.1 ng/mL (ref <=4.0)

## 2018-03-04 LAB — TSH: TSH: 1.58 mIU/L (ref 0.40–4.50)

## 2018-03-04 LAB — MAGNESIUM: MAGNESIUM: 1.7 mg/dL (ref 1.5–2.5)

## 2018-03-04 LAB — INSULIN, RANDOM: INSULIN: 34.6 u[IU]/mL — AB (ref 2.0–19.6)

## 2018-03-04 LAB — URIC ACID: Uric Acid, Serum: 3.6 mg/dL — ABNORMAL LOW (ref 4.0–8.0)

## 2018-03-05 LAB — URINALYSIS, ROUTINE W REFLEX MICROSCOPIC
Bilirubin Urine: NEGATIVE
Glucose, UA: NEGATIVE
HGB URINE DIPSTICK: NEGATIVE
KETONES UR: NEGATIVE
Leukocytes, UA: NEGATIVE
NITRITE: NEGATIVE
PROTEIN: NEGATIVE
SPECIFIC GRAVITY, URINE: 1.017 (ref 1.001–1.03)
pH: 6.5 (ref 5.0–8.0)

## 2018-03-05 LAB — MICROALBUMIN / CREATININE URINE RATIO
CREATININE, URINE: 94 mg/dL (ref 20–320)
Microalb Creat Ratio: 3 mcg/mg creat (ref <30)
Microalb, Ur: 0.3 mg/dL

## 2018-03-11 ENCOUNTER — Other Ambulatory Visit: Payer: Self-pay | Admitting: Internal Medicine

## 2018-03-11 MED ORDER — VITAMIN D3 125 MCG (5000 UT) PO CAPS: ORAL_CAPSULE | ORAL | 0 refills | Status: DC

## 2018-03-30 ENCOUNTER — Encounter: Payer: Self-pay | Admitting: Internal Medicine

## 2018-03-30 ENCOUNTER — Ambulatory Visit (HOSPITAL_COMMUNITY)
Admission: RE | Admit: 2018-03-30 | Discharge: 2018-03-30 | Disposition: A | Discharge status: Home / Self Care | Payer: Medicare Other | Source: Ambulatory Visit | Attending: Internal Medicine | Admitting: Internal Medicine

## 2018-03-30 ENCOUNTER — Ambulatory Visit (INDEPENDENT_AMBULATORY_CARE_PROVIDER_SITE_OTHER): Payer: Medicare Other | Admitting: Internal Medicine

## 2018-03-30 VITALS — BP 128/72 | HR 80 | Temp 97.6°F | Resp 16 | Ht 67.75 in | Wt 184.4 lb

## 2018-03-30 DIAGNOSIS — I25708 Atherosclerosis of coronary artery bypass graft(s), unspecified, with other forms of angina pectoris: Secondary | ICD-10-CM

## 2018-03-30 DIAGNOSIS — M542 Cervicalgia: Secondary | ICD-10-CM

## 2018-03-30 MED ORDER — HYDROCODONE-ACETAMINOPHEN 5-325 MG PO TABS: ORAL_TABLET | ORAL | 0 refills | Status: DC

## 2018-03-30 MED ORDER — PREDNISONE 20 MG PO TABS: ORAL_TABLET | ORAL | 0 refills | Status: DC

## 2018-03-30 MED ORDER — CYCLOBENZAPRINE HCL 10 MG PO TABS: ORAL_TABLET | ORAL | 0 refills | Status: DC

## 2018-03-30 NOTE — Patient Instructions (Signed)
Herniated Disk  A herniated disk, also called a ruptured disk or slipped disk, occurs when a disk in the spine bulges out too far. Between the bones in the spine (vertebrae), there are oval disks that are made of a soft, spongy center that is surrounded by a tough outer ring. The disks connect your vertebrae, help your spine move, and absorb shocks from your movement. When you have a herniated disk, the spongy center of the disk bulges out or breaks through the outer ring. It can press on a nerve between the vertebrae and cause pain. This can occur anywhere in the back or neck area, but the lower back is most commonly affected. What are the causes? This condition may be caused by:  Age-related wear and tear. The spongy centers of spinal disks tend to shrink and dry out with age, which makes them more likely to herniate.  Sudden injury, such as a strain or sprain. What increases the risk? Aging is the main risk factor for a herniated disk. Other risk factors include:  Being a man who is 39-78 years old.  Frequently doing activities that involve heavy lifting, bending, or twisting.  Frequently driving for long hours at a time.  Not getting enough exercise.  Being overweight.  Smoking.  Having a family history of back problems or herniated disks.  Being pregnant or giving birth.  Having poor nutrition.  Being tall. What are the signs or symptoms? Symptoms may vary depending on where your herniated disk is located.  A herniated disk in the lower back may cause sharp pain in: ? Part of the arm, leg, hip, or buttocks. ? The back of the lower leg (calf). ? The lower back, spreading down through the leg into the foot (sciatica).  A herniated disk in the neck may cause dizziness and vertigo. It may also cause pain or weakness in: ? The neck. ? The shoulder blades. ? Upper arm, forearm, or fingers.  You may also have muscle weakness. It may be difficult to: ? Lift your leg or  arm. ? Stand on your toes. ? Squeeze tightly with one of your hands.  Other symptoms may include: ? Numbness or tingling in the affected areas of the hands, arms, feet, or legs. ? Inability to control when you urinate or when you have bowel movements. This is a rare but serious sign of a severe herniated disk in the lower back. How is this diagnosed? This condition may be diagnosed based on:  Your symptoms.  Your medical history.  A physical exam. The exam may include: ? Straight-leg test. You will lie on your back while your health care provider lifts your leg, keeping your knee straight. If you feel pain, you likely have a herniated disk. ? Neurological tests. This includes checking for numbness, reflexes, muscle strength, and posture.  Imaging tests, such as: ? X-rays. ? MRI. ? CT scan. ? Electromyogram (EMG) to check the nerves that control muscles. This test may be used to determine which nerves are affected by your herniated disk. How is this treated? Treatment for this condition may include:  A short period of rest. This is usually the first treatment. ? You may be on bed rest for up to 2 days, or you may be instructed to stay home and avoid physical activity. ? If you have a herniated disk in your lower back, avoid sitting as much as possible. Sitting increases pressure on the disk.  Medicines. These may include: ? NSAIDs  to help reduce pain and swelling. ? Muscle relaxants to prevent sudden tightening of the back muscles (back spasms). ? Prescription pain medicines, if you have severe pain.  Steroid injections in the area of the herniated disk. This can help reduce pain and swelling.  Physical therapy to strengthen your back muscles. In many cases, symptoms go away with treatment over a period of days or weeks. You will most likely be free of symptoms after 3-4 months. If other treatments do not help to relieve your symptoms, you may need surgery. Follow these  instructions at home: Medicines  Take over-the-counter and prescription medicines only as told by your health care provider.  Do not drive or use heavy machinery while taking prescription pain medicine. Activity  Rest as directed.  After your rest period: ? Return to your normal activities and gradually begin exercising as told by your health care provider. Ask your health care provider what activities and exercises are safe for you. ? Use good posture. ? Avoid movements that cause pain. ? Do not lift anything that is heavier than 10 lb (4.5 kg) until your health care provider says this is safe. ? Do not sit or stand for long periods of time without changing positions. ? Do not sit for long periods of time without getting up and moving around.  If physical therapy was prescribed, do exercises as instructed.  Aim to strengthen muscles in your back and abdomen with exercises like crunches, swimming, or walking. General instructions  Do not use any products that contain nicotine or tobacco, such as cigarettes and e-cigarettes. These products can delay healing. If you need help quitting, ask your health care provider.  Do not wear high-heeled shoes.  Do not sleep on your belly.  If you are overweight, work with your health care provider to lose weight safely.  To prevent or treat constipation while you are taking prescription pain medicine, your health care provider may recommend that you: ? Drink enough fluid to keep your urine clear or pale yellow. ? Take over-the-counter or prescription medicines. ? Eat foods that are high in fiber, such as fresh fruits and vegetables, whole grains, and beans. ? Limit foods that are high in fat and processed sugars, such as fried and sweet foods.  Keep all follow-up visits as told by your health care provider. This is important. How is this prevented?   Maintain a healthy weight.  Try to avoid stressful situations.  Maintain physical  fitness. Do at least 150 minutes of moderate-intensity exercise each week, such as brisk walking or water aerobics.  When lifting objects: ? Keep your feet at least shoulder-width apart and tighten your abdominal muscles. ? Keep your spine neutral as you bend your knees and hips. It is important to lift using the strength of your legs, not your back. Do not lock your knees straight out. ? Always ask for help to lift heavy or awkward objects. Contact a health care provider if:  You have back pain or neck pain that does not get better after 6 weeks.  You have severe pain in your back, neck, legs, or arms.  You develop numbness, tingling, or weakness in any part of your body. Get help right away if:  You cannot move your arms or legs.  You cannot control when you urinate or have bowel movements.  You feel dizzy or you faint.  You have shortness of breath. This information is not intended to replace advice given to you by  your health care provider. Make sure you discuss any questions you have with your health care provider. Document Released: 03/22/2000 Document Revised: 11/20/2015 Document Reviewed: 11/20/2015 Elsevier Interactive Patient Education  [employer].

## 2018-03-30 NOTE — Progress Notes (Signed)
Subjective:    Patient ID: IRVAN TIEDT, male    DOB: [birth year], 72 y.o.   MRN: [id number]  HPI   This nice 72 yo MWM with HTN, ASCAD/CABG, s/p Kidney Transplant (2013)  presents with a 3-4 day hx/o of worsening  lower Lt neck pain radiating to the posterior Lt shoulder and also to the Lt elbow.Denies injury. Rates  the pain at 9-10/10. No CP, dyspnea,N/V or diaphoresis.  Medication Sig  . allopurinol (ZYLOPRIM) 300 MG tablet TAKE 1 TABLET BY MOUTH DAILY FOR GOUT PREVENTION  . aspirin EC 81 MG tablet Take 81 mg by mouth daily.  Marland Kitchen atorvastatin (LIPITOR) 80 MG tablet Take 1/2 to 1 tablet daily or as directed for cholesterol  . betamethasone dipropionate (DIPROLENE) 0.05 % cream Apply topically.  . Black Pepper-Turmeric (TURMERIC COMPLEX/BLACK PEPPER PO) Take 1,500 mg by mouth daily.  Marland Kitchen buPROPion (WELLBUTRIN XL) 300 MG 24 hr tablet TAKE ONE TABLET BY MOUTH EVERY MORNING  . Cholecalciferol (VITAMIN D3) 125 MCG (5000 UT) CAPS Take 2 caps (10,000 units) daily  . finasteride (PROSCAR) 5 MG tablet TAKE 1 TABLET BY MOUTH ONCE DAILY  . glucose blood (FREESTYLE TEST STRIPS) test strip Check blood sugar 3 to 4 times daily for medication regulation.  . insulin NPH-regular Human (NOVOLIN 70/30) (70-30) 100 UNIT/ML injection Take 20-30 units 2 x day or as directed  . latanoprost (XALATAN) 0.005 % ophthalmic solution INT 1 GTT IN EACH EYE QHS  . MAGNESIUM-OXIDE 400 (241.3 Mg) MG tablet TK 1 T PO BID  . mycophenolate (MYFORTIC) 360 MG TBEC EC tablet TAKE 1 TABLET BY MOUTH TWICE DAILY  . OVER THE COUNTER MEDICATION Taking 500 mg OTC Magnesium daily in addition to Magnesium oxide 400 mg BID(RX)  . tacrolimus (PROGRAF) 0.5 MG capsule Take 0.5 mg by mouth 2 (two) times daily.  . tacrolimus (PROGRAF) 1 MG capsule Take 1 mg by mouth 2 (two) times daily.  Marland Kitchen warfarin (COUMADIN) 5 MG tablet TAKE 1 TO 2 TABLETS BY MOUTH ONCE DAILY AS DIRECTED  . Zinc 50 MG CAPS Take 50 mg by mouth daily.   No  facility-administered medications prior to visit.    Allergies  Allergen Reactions  . Crestor [Rosuvastatin]     Elevated LFT's  . Lorazepam   . Losartan    Past Medical History:  Diagnosis Date  . Adenomatous colon polyp   . Allergy   . BPH (benign prostatic hyperplasia)   . CHF (congestive heart failure) ([place])   . 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 Diabetes mellitus type 2, controlled ([place])   . Glaucoma   . Gout   . 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 MI (myocardial infarction) ([place]) 09/26/2011  . NSTEMI (non-ST elevated myocardial infarction) ([place]) 11/26/2013   2013   . OSA (obstructive sleep apnea)   . PAF (paroxysmal atrial fibrillation) ([place])    s/p DC-CV in 6/13. Off coumadin due to ureteral bleed   Past Surgical History:  Procedure Laterality Date  . AV FISTULA PLACEMENT  2011   Left forearm  . COLONOSCOPY    . CORONARY ANGIOPLASTY WITH STENT PLACEMENT    . CORONARY ARTERY BYPASS GRAFT  2013  . KIDNEY TRANSPLANT  09/25/2011  . PERITONEAL CATHETER INSERTION  2011   Review of Systems   10 point systems review negative except as above.    Objective:   Physical Exam  BP 128/72  Pulse 80   Temp 97.6 F (36.4 C)   Resp 16   Ht 5' 7.75" (1.721 m)   Wt 184 lb 6.4 oz (83.6 kg)   BMI 28.25 kg/m   HEENT - WNL. Neck - supple. Tender low Lt para-cervical spasm Chest - Clear equal BS. Cor - Nl HS. RRR w/o sig MGR. PP 1(+). No edema. MS- FROM. Strength in UE is normal & equal.  Gait Nl. Neuro -  Nl w/o focal abnormalities.    Assessment & Plan:   1. Neck pain on left side  - DG Cervical Spine Complete  - predniSONE  20 MG tablet; 1 tab 3 x day for 3 days, then 1 tab 2 x day for 3 days, then 1 tab 1 x day for 5 days  Dispense: 20 tablet   - HYDROcodone-acetaminophen (NORCO) 5-325 MG tablet; Take 1/2 to 1 tablet every 3 to 4 hours as needed for Severe Pain  Dispense: 30 tablet  -  cyclobenzaprine (FLEXERIL) 10 MG tablet; Take 1/2 to 1 tablet 3 x /day as needed for Muscle Spasm  Dispense: 30 tablet; Refill: 0  - Discussed meds & SE's. Discussed heating pad & gentle massage

## 2018-04-09 ENCOUNTER — Other Ambulatory Visit: Payer: Self-pay | Admitting: Internal Medicine

## 2018-04-09 DIAGNOSIS — M5030 Other cervical disc degeneration, unspecified cervical region: Secondary | ICD-10-CM

## 2018-04-09 DIAGNOSIS — M542 Cervicalgia: Secondary | ICD-10-CM

## 2018-04-12 ENCOUNTER — Ambulatory Visit
Admission: RE | Admit: 2018-04-12 | Discharge: 2018-04-12 | Disposition: A | Discharge status: Home / Self Care | Payer: Medicare Other | Source: Ambulatory Visit | Attending: Internal Medicine | Admitting: Internal Medicine

## 2018-04-12 ENCOUNTER — Other Ambulatory Visit: Payer: Self-pay | Admitting: Internal Medicine

## 2018-04-12 DIAGNOSIS — M5412 Radiculopathy, cervical region: Principal | ICD-10-CM

## 2018-04-12 DIAGNOSIS — M5030 Other cervical disc degeneration, unspecified cervical region: Secondary | ICD-10-CM

## 2018-04-12 DIAGNOSIS — M4692 Unspecified inflammatory spondylopathy, cervical region: Secondary | ICD-10-CM

## 2018-04-15 ENCOUNTER — Ambulatory Visit (INDEPENDENT_AMBULATORY_CARE_PROVIDER_SITE_OTHER): Payer: Medicare Other | Admitting: Cardiovascular Disease

## 2018-04-15 ENCOUNTER — Encounter

## 2018-04-15 ENCOUNTER — Encounter: Payer: Self-pay | Admitting: Cardiovascular Disease

## 2018-04-15 VITALS — BP 122/74 | HR 58 | Ht 68.0 in | Wt 191.0 lb

## 2018-04-15 DIAGNOSIS — I2510 Atherosclerotic heart disease of native coronary artery without angina pectoris: Secondary | ICD-10-CM | POA: Diagnosis not present

## 2018-04-15 DIAGNOSIS — I441 Atrioventricular block, second degree: Secondary | ICD-10-CM | POA: Diagnosis not present

## 2018-04-15 DIAGNOSIS — I480 Paroxysmal atrial fibrillation: Secondary | ICD-10-CM

## 2018-04-15 NOTE — Patient Instructions (Signed)
Medication Instructions:  Your provider recommends that you continue on your current medications as directed. Please refer to the Current Medication list given to you today.    Labwork: None  Testing/Procedures: Your provider has requested that you have an echocardiogram. Echocardiography is a painless test that uses sound waves to create images of your heart. It provides your doctor with information about the size and shape of your heart and how well your heart's chambers and valves are working. This procedure takes approximately one hour. There are no restrictions for this procedure. You are scheduled for your echo THIS Friday, January 10. Please arrive by 7:00AM.    Follow-Up: Your provider wants you to follow-up in: 1 year with Dr. Acie Fredrickson. You will receive a reminder letter in the mail two months in advance. If you don't receive a letter, please call our office to schedule the follow-up appointment.    Any Other Special Instructions Will Be Listed Below (If Applicable).     If you need a refill on your cardiac medications before your next appointment, please call your pharmacy.

## 2018-04-15 NOTE — Progress Notes (Signed)
Cardiology Office Note:    Date:  04/15/2018   ID:  Robert Arnold, DOB October 27, 1945, MRN [id number]  PCP:  [name], MD  Cardiologist:  No primary care provider on file.  Electrophysiologist:  None   Referring MD: [name], MD   Chief Complaint  Patient presents with  . Question of trifascular heart block    Problem list 1.  Coronary artery disease-status post stenting ( September 26, 2011 and coronary artery bypass grafting -  ~ Oct. ,  2013  2.  Paroxysmal atrial fibrillation 3.  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 5.  Hyperlipidemia    Jan. 8 2019   Robert Arnold is a 73 y.o. male with a hx of AV block . We are asked to see him today by Dr. Janit Pagan for further evaluation of this variable heart block.  He has a history of coronary artery disease.  The day following his kidney transplant he had a heart attack.  He had a stent placed and then subsequently had coronary artery bypass grafting.  He developed atrial fibrillation following his bypass grafting and continues to have paroxysmal atrial fibrillation.  He was started on Coumadin.  He has had his INR levels measured at Dr. Titus Mould office.   Hx of kidney transplant 6 years ago.  Also has a history of insulin-dependent diabetes mellitus. He typically exercises quite a bit.  He is not exercised in the past several weeks because of the recent this recent finding of variable heart block. He was seen by Dr. Janit Pagan and the EKG suggested that he might have trifascicular heart block.  Denies any episodes of syncope or presyncope.  Past Medical History:  Diagnosis Date  . Adenomatous colon polyp   . Allergy   . BPH (benign prostatic hyperplasia)   . CHF (congestive heart failure) ([place])   . 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 Diabetes  mellitus type 2, controlled ([place])   . Glaucoma   . Gout   . 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 MI (myocardial infarction) ([facility]) 09/26/2011  . NSTEMI (non-ST elevated myocardial infarction) ([place]) 11/26/2013   2013   . OSA (obstructive sleep apnea)   . PAF (paroxysmal atrial fibrillation) ([place])    s/p DC-CV in 6/13. Off coumadin due to ureteral bleed    Past Surgical History:  Procedure Laterality Date  . AV FISTULA PLACEMENT  2011   Left forearm  . COLONOSCOPY    . CORONARY ANGIOPLASTY WITH STENT PLACEMENT    . CORONARY ARTERY BYPASS GRAFT  2013  . KIDNEY TRANSPLANT  09/25/2011  . PERITONEAL CATHETER INSERTION  2011    Current Medications: Current Meds  Medication Sig  . allopurinol (ZYLOPRIM) 300 MG tablet TAKE 1 TABLET BY MOUTH DAILY FOR GOUT PREVENTION  . aspirin EC 81 MG tablet Take 81 mg by mouth daily.  Marland Kitchen atorvastatin (LIPITOR) 80 MG tablet Take 40 mg by mouth 3 (three) times a week.  . betamethasone dipropionate (DIPROLENE) 0.05 % cream Apply topically.  . Black Pepper-Turmeric (TURMERIC COMPLEX/BLACK PEPPER PO) Take 1,500 mg by mouth daily.  Marland Kitchen buPROPion (WELLBUTRIN XL) 300 MG 24 hr tablet TAKE ONE TABLET BY MOUTH EVERY MORNING  . Cholecalciferol (VITAMIN D3) 125 MCG (5000 UT) CAPS Take 2 caps (10,000 units)  daily  . finasteride (PROSCAR) 5 MG tablet TAKE 1 TABLET BY MOUTH ONCE DAILY  . glucose blood (FREESTYLE TEST STRIPS) test strip Check blood sugar 3 to 4 times daily for medication regulation.  . insulin NPH-regular Human (NOVOLIN 70/30) (70-30) 100 UNIT/ML injection Take 20-30 units 2 x day or as directed  . latanoprost (XALATAN) 0.005 % ophthalmic solution INT 1 GTT IN EACH EYE QHS  . MAGNESIUM-OXIDE 400 (241.3 Mg) MG tablet TK 1 T PO BID  . mycophenolate (MYFORTIC) 360 MG TBEC EC tablet TAKE 1 TABLET BY MOUTH TWICE DAILY  . OVER THE COUNTER MEDICATION Taking 500 mg OTC Magnesium daily in addition to Magnesium oxide 400 mg BID(RX)  . tacrolimus  (PROGRAF) 0.5 MG capsule Take 0.5 mg by mouth 2 (two) times daily.  . tacrolimus (PROGRAF) 1 MG capsule Take 1 mg by mouth 2 (two) times daily.  Marland Kitchen warfarin (COUMADIN) 5 MG tablet TAKE 1 TO 2 TABLETS BY MOUTH ONCE DAILY AS DIRECTED  . Zinc 50 MG CAPS Take 50 mg by mouth daily.     Allergies:   Crestor [rosuvastatin]; Lorazepam; and Losartan   Social History   Socioeconomic History  . Marital status: Married    Spouse name: Not on file  . Number of children: 1  . Years of education: Not on file  . Highest education level: Not on file  Occupational History  . Occupation: retired  Scientific laboratory technician  . Financial resource strain: Not on file  . Food insecurity:    Worry: Not on file    Inability: Not on file  . Transportation needs:    Medical: Not on file    Non-medical: Not on file  Tobacco Use  . Smoking status: Former Smoker    Last attempt to quit: 11/29/1990    Years since quitting: 27.3  . Smokeless tobacco: Never Used  Substance and Sexual Activity  . Alcohol use: Yes    Comment: 1-2 a month  . Drug use: No  . Sexual activity: Not on file  Lifestyle  . Physical activity:    Days per week: Not on file    Minutes per session: Not on file  . Stress: Not on file  Relationships  . Social connections:    Talks on phone: Not on file    Gets together: Not on file    Attends religious service: Not on file    Active member of club or organization: Not on file    Attends meetings of clubs or organizations: Not on file    Relationship status: Not on file  Other Topics Concern  . Not on file  Social History Narrative  . Not on file     Family History: The patient's family history includes COPD in his mother; Diabetes in his mother; Heart attack in his father; Kidney disease in his sister. There is no history of Colon cancer, Stomach cancer, Rectal cancer, Esophageal cancer, or Liver cancer.  ROS:   Please see the history of present illness.     All other systems reviewed  and are negative.  EKGs/Labs/Other Studies Reviewed:    The following studies were reviewed today:   EKG:    Recent Labs: 03/03/2018: ALT 23; BUN 18; Creat 1.12; Hemoglobin 15.2; Magnesium 1.7; Platelets 170; Potassium 4.6; Sodium 139; TSH 1.58  Recent Lipid Panel    Component Value Date/Time   CHOL 143 03/03/2018 1405   CHOL 158 03/15/2013 0744   TRIG 118 03/03/2018 1405  HDL 65 03/03/2018 1405   HDL 61 03/15/2013 0744   CHOLHDL 2.2 03/03/2018 1405   VLDL 17 09/09/2016 1042   LDLCALC 58 03/03/2018 1405    Physical Exam:    VS:  BP 122/74   Pulse (!) 58   Ht 5\' 8"  (1.727 m)   Wt 191 lb (86.6 kg)   SpO2 97%   BMI 29.04 kg/m     Wt Readings from Last 3 Encounters:  04/15/18 191 lb (86.6 kg)  03/30/18 184 lb 6.4 oz (83.6 kg)  03/03/18 185 lb 9.6 oz (84.2 kg)     GEN:  Well nourished, well developed in no acute distress HEENT: Normal NECK: No JVD; No carotid bruits LYMPHATICS: No lymphadenopathy CARDIAC: RRR, soft murmur  RESPIRATORY:  Clear to auscultation without rales, wheezing or rhonchi  ABDOMEN: Soft, non-tender, non-distended MUSCULOSKELETAL:  No edema; No deformity  SKIN: Warm and dry NEUROLOGIC:  Alert and oriented x 3 PSYCHIATRIC:  Normal affect   ASSESSMENT:    1. PAF (paroxysmal atrial fibrillation) ([place])   2. CAD in native artery   3. Wenckebach block    PLAN:    In order of problems listed above:  1. Possible trifascicular heart block: I repeated his EKG.  It is clear that he does not have trifascicular block at this time.  I suspect that the right bundle branch block beats that were recorded on his previous EKG were actually just premature ventricular contractions.  At baseline he does have a first-degree AV block/Wenke Bach heart block as well as a left anterior fascicular block.  Is never had any episodes of syncope or presyncope.  He is not on any medications that would slow his heart rate.  2.  Coronary disease: The patient has a history  of stent placement followed by coronary artery bypass grafting several months later.  He is done well.  He is not had any episodes of angina.  He is not having any angina so I do not think that we need to pursue any ischemic work-up at this time.  3.  Hyperlipidemia: Stable.  4.  Paroxysmal atrial fibrillation: The patient remains on Coumadin.  He has had paroxysmal atrial fibrillation apparently since his coronary artery bypass grafting.  His INR levels appear to be therapeutic for the most part. We have not done an echocardiogram in many years.  We will repeat his echocardiogram for assessment of his LV size and function as well as valvular function.  Medication Adjustments/Labs and Tests Ordered: Current medicines are reviewed at length with the patient today.  Concerns regarding medicines are outlined above.  Orders Placed This Encounter  Procedures  . EKG 12-Lead  . ECHOCARDIOGRAM COMPLETE   No orders of the defined types were placed in this encounter.   Patient Instructions  Medication Instructions:  Your provider recommends that you continue on your current medications as directed. Please refer to the Current Medication list given to you today.    Labwork: None  Testing/Procedures: Your provider has requested that you have an echocardiogram. Echocardiography is a painless test that uses sound waves to create images of your heart. It provides your doctor with information about the size and shape of your heart and how well your heart's chambers and valves are working. This procedure takes approximately one hour. There are no restrictions for this procedure. You are scheduled for your echo THIS Friday, January 10. Please arrive by 7:00AM.    Follow-Up: Your provider wants you to follow-up in:  1 year with Dr. Acie Fredrickson. You will receive a reminder letter in the mail two months in advance. If you don't receive a letter, please call our office to schedule the follow-up appointment.    Any  Other Special Instructions Will Be Listed Below (If Applicable).     If you need a refill on your cardiac medications before your next appointment, please call your pharmacy.      Signed, Mertie Moores, MD  04/15/2018 5:51 PM    [place]

## 2018-04-17 ENCOUNTER — Ambulatory Visit (HOSPITAL_COMMUNITY): Payer: Medicare Other | Attending: Cardiovascular Disease

## 2018-04-17 ENCOUNTER — Other Ambulatory Visit: Payer: Self-pay

## 2018-04-17 DIAGNOSIS — I2510 Atherosclerotic heart disease of native coronary artery without angina pectoris: Secondary | ICD-10-CM | POA: Diagnosis present

## 2018-04-17 DIAGNOSIS — I480 Paroxysmal atrial fibrillation: Secondary | ICD-10-CM | POA: Insufficient documentation

## 2018-04-17 DIAGNOSIS — I441 Atrioventricular block, second degree: Secondary | ICD-10-CM | POA: Insufficient documentation

## 2018-04-18 ENCOUNTER — Other Ambulatory Visit: Payer: Self-pay | Admitting: Internal Medicine

## 2018-04-18 DIAGNOSIS — M542 Cervicalgia: Secondary | ICD-10-CM

## 2018-04-18 MED ORDER — HYDROCODONE-ACETAMINOPHEN 5-325 MG PO TABS: ORAL_TABLET | ORAL | 0 refills | Status: DC

## 2018-04-24 ENCOUNTER — Other Ambulatory Visit: Payer: Self-pay | Admitting: Neurosurgery

## 2018-04-27 ENCOUNTER — Telehealth: Payer: Self-pay

## 2018-04-27 NOTE — Telephone Encounter (Signed)
   [facility] Pre-operative Risk Assessment    Request for surgical clearance:  1. What type of surgery is being performed? C5-6 anterior cervical fusion   2. When is this surgery scheduled? 05/04/18   3. What type of clearance is required (medical clearance vs. Pharmacy clearance to hold med vs. Both)? Pharmacy  4. Are there any medications that need to be held prior to surgery and how long? Aspirin   5. Practice name and name of physician performing surgery? [facility]   6. What is your office phone number [phone]    7.   What is your office fax number [phone]  8.   Anesthesia type (None, local, MAC, general) ? General   Mady Haagensen 04/27/2018, 3:41 PM  _________________________________________________________________   (provider comments below)
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
## 2018-04-29 ENCOUNTER — Ambulatory Visit: Payer: Self-pay | Admitting: Physician Assistant

## 2018-04-29 NOTE — Telephone Encounter (Signed)
   Primary Cardiologist: Mertie Moores, MD  Chart reviewed as part of pre-operative protocol coverage.   Patient has a PMH of CAD on aspirin and paroxysmal atrial fibrillation on coumadin.   Dr. Acie Fredrickson, can you address whether patient is okay to hold aspirin for upcoming surgery 05/04/2018?  Pharmacy, can you address this patients coumadin?  Please route responses back to the preop pool for ongoing clearance.    Abigail Butts, PA-C 04/29/2018, 9:19 AM

## 2018-04-29 NOTE — Telephone Encounter (Signed)
Call and spoke with pt letting him know to hold coumadin today until told to restart after procedure.

## 2018-04-29 NOTE — Telephone Encounter (Signed)
Patient with diagnosis of Afib on warfarin for anticoagulation.    Procedure: cervical fusion Date of procedure: 05/04/18  CHADS2-VASc score of  5 (CHF, HTN, AGE, DM2, stroke/tia x 2, CAD, AGE, male)  Per office protocol, patient can hold warfarin for 5 days prior to procedure.   Patient will not need bridging with Lovenox (enoxaparin) around procedure.  Pt is not managed by our coumadin clinic; thus, will need to make managing coumadin clinic aware of need to hold anticoagulation.

## 2018-04-29 NOTE — Telephone Encounter (Signed)
Robert Arnold may hold ASA for 5-7 days for spinal surgery

## 2018-04-29 NOTE — Telephone Encounter (Signed)
   Primary Cardiologist: Mertie Moores, MD  Chart reviewed as part of pre-operative protocol coverage.   Per pharmacy recommendations, patient can hold coumadin 5 days prior to upcoming procedure and does not requiring bridging with lovenox.   Per Dr. Acie Fredrickson, patient can hold aspirin 5-7 days before upcoming spinal surgery.  I will route this recommendation to the requesting party via Epic fax function and remove from pre-op pool.  Please call with questions.  Abigail Butts, PA-C 04/29/2018, 2:40 PM

## 2018-04-30 NOTE — Pre-Procedure Instructions (Signed)
Robert Arnold  04/30/2018      [place] [place] [facility] [address] [place] [address] Phone: [phone] Fax: [phone]    Your procedure is scheduled on May 04, 2018.  Report to [facility] Admitting at 945 AM.  Call this number if you have problems the morning of surgery:  [phone]   Remember:  Do not eat or drink after midnight.   Take these medicines the morning of surgery with A SIP OF WATER  Allopurinol (zyloprim) Bupropion (Wellbutrin) Finasteride (proscar) Mycophenolate (Myfortic) Tacrolimus (prograf)  Hold coumadin beginning 04/29/2018 as instructed by your doctor.  Resume per MD instructions  Follow your surgeon's instructions on when to hold/resume aspirin.  If no instructions were given call the office to determine how they would like to you take aspirin  7 days prior to surgery STOP taking any Aleve, Naproxen, Ibuprofen, Motrin, Advil, Goody's, BC's, all herbal medications, fish oil, and all vitamins  WHAT DO I DO ABOUT MY DIABETES MEDICATION?   Marland Kitchen Do not take oral diabetes medicines (pills) the morning of surgery.  . THE NIGHT BEFORE SURGERY, take 8 units of Novolin 70/30 insulin.       . THE MORNING OF SURGERY, take no Novolin 70/30 insulin.   Reviewed and Endorsed by [facility] Patient Education Committee, August 2015  How to Manage Your Diabetes Before and After Surgery  Why is it important to control my blood sugar before and after surgery? . Improving blood sugar levels before and after surgery helps healing and can limit problems. . A way of improving blood sugar control is eating a healthy diet by: o  Eating less sugar and carbohydrates o  Increasing activity/exercise o  Talking with your doctor about reaching your blood sugar goals . High blood sugars (greater than 180 mg/dL) can raise your risk of infections and slow your recovery, so you will need to focus on  controlling your diabetes during the weeks before surgery. . Make sure that the doctor who takes care of your diabetes knows about your planned surgery including the date and location.  How do I manage my blood sugar before surgery? . Check your blood sugar at least 4 times a day, starting 2 days before surgery, to make sure that the level is not too high or low. o Check your blood sugar the morning of your surgery when you wake up and every 2 hours until you get to the Short Stay unit. . If your blood sugar is less than 70 mg/dL, you will need to treat for low blood sugar: o Do not take insulin. o Treat a low blood sugar (less than 70 mg/dL) with  cup of clear juice (cranberry or apple), 4 glucose tablets, OR glucose gel. Recheck blood sugar in 15 minutes after treatment (to make sure it is greater than 70 mg/dL). If your blood sugar is not greater than 70 mg/dL on recheck, call [phone] o  for further instructions. . Report your blood sugar to the short stay nurse when you get to Short Stay.  . If you are admitted to the hospital after surgery: o Your blood sugar will be checked by the staff and you will probably be given insulin after surgery (instead of oral diabetes medicines) to make sure you have good blood sugar levels. o The goal for blood sugar control after surgery is 80-180 mg/dL.   [place]- Preparing For Surgery  Before  surgery, you can play an important role. Because skin is not sterile, your skin needs to be as free of germs as possible. You can reduce the number of germs on your skin by washing with CHG (chlorahexidine gluconate) Soap before surgery.  CHG is an antiseptic cleaner which kills germs and bonds with the skin to continue killing germs even after washing.    Oral Hygiene is also important to reduce your risk of infection.  Remember - BRUSH YOUR TEETH THE MORNING OF SURGERY WITH YOUR REGULAR TOOTHPASTE  Please do not use if you have an allergy to CHG or  antibacterial soaps. If your skin becomes reddened/irritated stop using the CHG.  Do not shave (including legs and underarms) for at least 48 hours prior to first CHG shower. It is OK to shave your face.  Please follow these instructions carefully.   1. Shower the NIGHT BEFORE SURGERY and the MORNING OF SURGERY with CHG.   2. If you chose to wash your hair, wash your hair first as usual with your normal shampoo.  3. After you shampoo, rinse your hair and body thoroughly to remove the shampoo.  4. Use CHG as you would any other liquid soap. You can apply CHG directly to the skin and wash gently with a scrungie or a clean washcloth.   5. Apply the CHG Soap to your body ONLY FROM THE NECK DOWN.  Do not use on open wounds or open sores. Avoid contact with your eyes, ears, mouth and genitals (private parts). Wash Face and genitals (private parts)  with your normal soap.  6. Wash thoroughly, paying special attention to the area where your surgery will be performed.  7. Thoroughly rinse your body with warm water from the neck down.  8. DO NOT shower/wash with your normal soap after using and rinsing off the CHG Soap.  9. Pat yourself dry with a CLEAN TOWEL.  10. Wear CLEAN PAJAMAS to bed the night before surgery, wear comfortable clothes the morning of surgery  11. Place CLEAN SHEETS on your bed the night of your first shower and DO NOT SLEEP WITH PETS.  Day of Surgery:  Do not apply any deodorants/lotions.  Please wear clean clothes to the hospital/surgery center.   Remember to brush your teeth WITH YOUR REGULAR TOOTHPASTE.    Do not wear jewelry  Do not wear lotions, powders, or colognes, or deodorant.  Men may shave face and neck.  Do not bring valuables to the hospital.  [facility] is not responsible for any belongings or valuables.  Contacts, dentures or bridgework may not be worn into surgery.  Leave your suitcase in the car.  After surgery it may be brought to your  room.  For patients admitted to the hospital, discharge time will be determined by your treatment team.  Patients discharged the day of surgery will not be allowed to drive home.   Please read over the following fact sheets that you were given.

## 2018-04-30 NOTE — Progress Notes (Addendum)
PCP -  [name], MD Cardiologist - Mertie Moores, MD  Chest x-ray - Pt denies past year, no recent respiratory infections/complications EKG - 12/09/5699 in EPIC  Stress Test - 01/08/12 in EPIC ECHO - 04/17/2018 in EPIC  Cardiac Cath - 2013 in EPIC  Sleep Study - yes-negative CPAP - no  Fasting Blood Sugar - 110s Checks Blood Sugar 2 times a day  Blood Thinner Instructions: Coumadin-hold 04/29/2018 Aspirin Instructions: ASA 81 mg -hold 04/29/2018  Anesthesia review: YES-heart hx  Patient denies shortness of breath, fever, cough and chest pain at PAT appointment  Patient verbalized understanding of instructions that were given to them at the PAT appointment. Patient was also instructed that they will need to review over the PAT instructions again at home before surgery.

## 2018-05-01 ENCOUNTER — Encounter (HOSPITAL_COMMUNITY): Payer: Self-pay

## 2018-05-01 ENCOUNTER — Other Ambulatory Visit: Payer: Self-pay

## 2018-05-01 ENCOUNTER — Encounter (HOSPITAL_COMMUNITY)
Admission: RE | Admit: 2018-05-01 | Discharge: 2018-05-01 | Disposition: A | Discharge status: Home / Self Care | Payer: Medicare Other | Source: Ambulatory Visit | Attending: Neurosurgery | Admitting: Neurosurgery

## 2018-05-01 DIAGNOSIS — E1122 Type 2 diabetes mellitus with diabetic chronic kidney disease: Secondary | ICD-10-CM | POA: Diagnosis not present

## 2018-05-01 DIAGNOSIS — G250 Essential tremor: Secondary | ICD-10-CM | POA: Diagnosis not present

## 2018-05-01 DIAGNOSIS — N400 Enlarged prostate without lower urinary tract symptoms: Secondary | ICD-10-CM | POA: Diagnosis not present

## 2018-05-01 DIAGNOSIS — Z7982 Long term (current) use of aspirin: Secondary | ICD-10-CM | POA: Diagnosis not present

## 2018-05-01 DIAGNOSIS — Z940 Kidney transplant status: Secondary | ICD-10-CM | POA: Diagnosis not present

## 2018-05-01 DIAGNOSIS — M50122 Cervical disc disorder at C5-C6 level with radiculopathy: Secondary | ICD-10-CM | POA: Diagnosis present

## 2018-05-01 DIAGNOSIS — Z01812 Encounter for preprocedural laboratory examination: Secondary | ICD-10-CM | POA: Insufficient documentation

## 2018-05-01 DIAGNOSIS — M4722 Other spondylosis with radiculopathy, cervical region: Secondary | ICD-10-CM | POA: Diagnosis not present

## 2018-05-01 DIAGNOSIS — D696 Thrombocytopenia, unspecified: Secondary | ICD-10-CM | POA: Diagnosis not present

## 2018-05-01 DIAGNOSIS — F329 Major depressive disorder, single episode, unspecified: Secondary | ICD-10-CM | POA: Diagnosis not present

## 2018-05-01 DIAGNOSIS — Z79899 Other long term (current) drug therapy: Secondary | ICD-10-CM | POA: Diagnosis not present

## 2018-05-01 DIAGNOSIS — H409 Unspecified glaucoma: Secondary | ICD-10-CM | POA: Diagnosis not present

## 2018-05-01 DIAGNOSIS — N028 Recurrent and persistent hematuria with other morphologic changes: Secondary | ICD-10-CM | POA: Diagnosis not present

## 2018-05-01 DIAGNOSIS — I480 Paroxysmal atrial fibrillation: Secondary | ICD-10-CM | POA: Diagnosis not present

## 2018-05-01 DIAGNOSIS — I509 Heart failure, unspecified: Secondary | ICD-10-CM | POA: Diagnosis not present

## 2018-05-01 DIAGNOSIS — E291 Testicular hypofunction: Secondary | ICD-10-CM | POA: Diagnosis not present

## 2018-05-01 DIAGNOSIS — N182 Chronic kidney disease, stage 2 (mild): Secondary | ICD-10-CM | POA: Diagnosis not present

## 2018-05-01 DIAGNOSIS — M109 Gout, unspecified: Secondary | ICD-10-CM | POA: Diagnosis not present

## 2018-05-01 DIAGNOSIS — I252 Old myocardial infarction: Secondary | ICD-10-CM | POA: Diagnosis not present

## 2018-05-01 DIAGNOSIS — Z86010 Personal history of colonic polyps: Secondary | ICD-10-CM | POA: Diagnosis not present

## 2018-05-01 DIAGNOSIS — I130 Hypertensive heart and chronic kidney disease with heart failure and stage 1 through stage 4 chronic kidney disease, or unspecified chronic kidney disease: Secondary | ICD-10-CM | POA: Diagnosis not present

## 2018-05-01 DIAGNOSIS — Z794 Long term (current) use of insulin: Secondary | ICD-10-CM | POA: Diagnosis not present

## 2018-05-01 DIAGNOSIS — I5022 Chronic systolic (congestive) heart failure: Secondary | ICD-10-CM | POA: Diagnosis not present

## 2018-05-01 DIAGNOSIS — G4733 Obstructive sleep apnea (adult) (pediatric): Secondary | ICD-10-CM | POA: Diagnosis not present

## 2018-05-01 DIAGNOSIS — Z7901 Long term (current) use of anticoagulants: Secondary | ICD-10-CM | POA: Diagnosis not present

## 2018-05-01 LAB — BASIC METABOLIC PANEL
Anion gap: 9 (ref 5–15)
BUN: 22 mg/dL (ref 8–23)
CHLORIDE: 104 mmol/L (ref 98–111)
CO2: 25 mmol/L (ref 22–32)
Calcium: 9.9 mg/dL (ref 8.9–10.3)
Creatinine, Ser: 1.06 mg/dL (ref 0.61–1.24)
GFR calc Af Amer: >60 mL/min (ref >60)
GFR calc non Af Amer: >60 mL/min (ref >60)
Glucose, Bld: 111 mg/dL — ABNORMAL HIGH (ref 70–99)
Potassium: 4.4 mmol/L (ref 3.5–5.1)
Sodium: 138 mmol/L (ref 135–145)

## 2018-05-01 LAB — HEMOGLOBIN A1C
Hgb A1c MFr Bld: 6.3 % — ABNORMAL HIGH (ref 4.8–5.6)
Mean Plasma Glucose: 134.11 mg/dL

## 2018-05-01 LAB — PROTIME-INR
INR: 1.24
PROTHROMBIN TIME: 15.5 s — AB (ref 11.4–15.2)

## 2018-05-01 LAB — TYPE AND SCREEN
ABO/RH(D): O POS
Antibody Screen: NEGATIVE

## 2018-05-01 LAB — CBC
HCT: 46.7 % (ref 39.0–52.0)
Hemoglobin: 15.5 g/dL (ref 13.0–17.0)
MCH: 31.5 pg (ref 26.0–34.0)
MCHC: 33.2 g/dL (ref 30.0–36.0)
MCV: 94.9 fL (ref 80.0–100.0)
NRBC: 0 % (ref 0.0–0.2)
Platelets: 197 10*3/uL (ref 150–400)
RBC: 4.92 MIL/uL (ref 4.22–5.81)
RDW: 12.3 % (ref 11.5–15.5)
WBC: 7.5 10*3/uL (ref 4.0–10.5)

## 2018-05-01 LAB — SURGICAL PCR SCREEN
MRSA, PCR: NEGATIVE
Staphylococcus aureus: POSITIVE — AB

## 2018-05-01 LAB — GLUCOSE, CAPILLARY: Glucose-Capillary: 103 mg/dL — ABNORMAL HIGH (ref 70–99)

## 2018-05-01 LAB — ABO/RH: ABO/RH(D): O POS

## 2018-05-01 NOTE — Progress Notes (Signed)
Anesthesia Chart Review:  Case:  [id number] Date/Time:  05/04/18 1130   Procedure:  Cervical 5-6 Anterior cervical decompression/discectomy/fusion (N/A ) - Cervical 5-6 Anterior cervical decompression/discectomy/fusion   Anesthesia type:  General   Pre-op diagnosis:  Herniated nucleus pulposus, Cervical   Location:  [facility] OR ROOM 20 / [place] [facility]   Surgeon:  Jovita Gamma, MD      DISCUSSION: 73 yo male former smoker. 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  Pt was seen in followup by Dr. Acie Fredrickson 04/15/2018. Per his note "The patient has a history of stent placement followed by coronary artery bypass grafting several months later.  He is done well.  He is not had any episodes of angina.  He is not having any angina so I do not think that we need to pursue any ischemic work-up at this time."  Dr. Acie Fredrickson cleared pt to hold ASA 5-7 days for spinal surgery.   Pharmacy recommends holding coumadin 5 days prior to surgery and does not need lovenox bridge.  Anticipate can proceed as planned barring acute status change.  VS: BP (!) 140/92   Pulse 88   Temp 36.6 C (Oral)   Resp 18   Ht 5\' 8"  (1.727 m)   Wt 85.5 kg   SpO2 98%   BMI 28.66 kg/m   PROVIDERS: [name], MD is PCP  Grayland Jack, MD is Cardiologist  LABS: Labs reviewed: Acceptable for surgery. (all labs ordered are listed, but only abnormal results are displayed)  Labs Reviewed  SURGICAL PCR SCREEN - Abnormal; Notable for the following components:      Result Value   Staphylococcus aureus POSITIVE (*)    All other components within normal limits  GLUCOSE, CAPILLARY - Abnormal; Notable for the following components:   Glucose-Capillary 103 (*)    All other components within normal limits  BASIC METABOLIC PANEL - Abnormal;  Notable for the following components:   Glucose, Bld 111 (*)    All other components within normal limits  HEMOGLOBIN A1C - Abnormal; Notable for the following components:   Hgb A1c MFr Bld 6.3 (*)    All other components within normal limits  PROTIME-INR - Abnormal; Notable for the following components:   Prothrombin Time 15.5 (*)    All other components within normal limits  CBC  TYPE AND SCREEN  ABO/RH     IMAGES: MRI C-spine 04/12/2018: IMPRESSION: Spondylosis appearing worst at C5-6 where a disc osteophyte complex effaces the ventral thecal sac and uncovertebral disease causes moderately severe to severe bilateral foraminal narrowing.  Mild to moderate left foraminal narrowing at C3-4 and C4-5 due to some uncovertebral spurring and facet arthropathy. The central canal and right foramen are open at this level.  CHEST  2 VIEW 10/11/2016:  COMPARISON:  04/09/2016  FINDINGS: Heart and mediastinal contours are within normal limits. Prior CABG. Calcified granuloma in left upper lobe. No focal opacities or effusions. No acute bony abnormality.  IMPRESSION: No active cardiopulmonary disease.  EKG: 04/15/2018: Sinus rhythm with 2nd degree AV block (Mobitz I) with frequent premature ventricular complexes. Rate 72. LAFB. Septal infarct, age undetermined.   CV: TTE 04/17/2018: Study Conclusions  - Left ventricle: The cavity size was normal. There was mild   concentric hypertrophy. Systolic function was normal. The   estimated ejection fraction was in  the range of 50% to 55%. Wall   motion was normal; there were no regional wall motion   abnormalities. Features are consistent with a pseudonormal left   ventricular filling pattern, with concomitant abnormal relaxation   and increased filling pressure (grade 2 diastolic dysfunction).   Doppler parameters are consistent with indeteminate ventricular   filling pressure. - Aortic valve: Valve mobility was restricted. There was  mild   stenosis. There was trivial regurgitation. Peak velocity (S): 215   cm/s. Mean gradient (S): 11 mm Hg. Valve area (VTI): 1.07 cm^2.   Valve area (Vmax): 1.1 cm^2. Valve area (Vmean): 1.04 cm^2. - Aorta: Ascending aortic diameter: 37 mm (S). - Ascending aorta: The ascending aorta was mildly dilated. - Mitral valve: Transvalvular velocity was within the normal range.   There was no evidence for stenosis. There was trivial   regurgitation. Valve area by pressure half-time: 2.34 cm^2. Valve   area by continuity equation (using LVOT flow): 1.17 cm^2. - Left atrium: The atrium was severely dilated. - Right ventricle: The cavity size was normal. Wall thickness was   normal. Systolic function was normal. - Tricuspid valve: There was mild regurgitation. - Pulmonary arteries: Systolic pressure was within the normal   range. PA peak pressure: 27 mm Hg (S).   Past Medical History:  Diagnosis Date  . Adenomatous colon polyp   . Allergy   . BPH (benign prostatic hyperplasia)   . CHF (congestive heart failure) ([place])   . 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 Diabetes mellitus type 2, controlled ([place])   . Glaucoma   . Gout   . 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 MI (myocardial infarction) ([place]) 09/26/2011  . NSTEMI (non-ST elevated myocardial infarction) ([place]) 11/26/2013   2013   . PAF (paroxysmal atrial fibrillation) ([place])    s/p DC-CV in 6/13. Off coumadin due to ureteral bleed    Past Surgical History:  Procedure Laterality Date  . AV FISTULA PLACEMENT  2011   Left forearm  . COLONOSCOPY    . CORONARY ANGIOPLASTY WITH STENT PLACEMENT    . CORONARY ARTERY BYPASS GRAFT  2013  . KIDNEY TRANSPLANT  09/25/2011  . PERITONEAL CATHETER INSERTION  2011    MEDICATIONS: . allopurinol (ZYLOPRIM) 300 MG tablet  . Ascorbic Acid (VITAMIN C) 1000 MG tablet  . aspirin EC 81 MG tablet  . atorvastatin (LIPITOR) 80 MG tablet  .  betamethasone dipropionate (DIPROLENE) 0.05 % cream  . Black Pepper-Turmeric (TURMERIC COMPLEX/BLACK PEPPER PO)  . buPROPion (WELLBUTRIN XL) 150 MG 24 hr tablet  . Cholecalciferol (VITAMIN D3) 125 MCG (5000 UT) CAPS  . finasteride (PROSCAR) 5 MG tablet  . glucose blood (FREESTYLE TEST STRIPS) test strip  . insulin NPH-regular Human (NOVOLIN 70/30) (70-30) 100 UNIT/ML injection  . latanoprost (XALATAN) 0.005 % ophthalmic solution  . magnesium gluconate (MAGONATE) 500 MG tablet  . MAGNESIUM-OXIDE 400 (241.3 Mg) MG tablet  . mycophenolate (MYFORTIC) 360 MG TBEC EC tablet  . Probiotic Product (PROBIOTIC DAILY PO)  . tacrolimus (PROGRAF) 0.5 MG capsule  . tacrolimus (PROGRAF) 1 MG capsule  . warfarin (COUMADIN) 5 MG tablet  . Zinc 50 MG CAPS   No current facility-administered medications for this encounter.      Wynonia Musty [facility] Short Stay Center/Anesthesiology Phone [phone] 05/01/2018 1:37 PM

## 2018-05-01 NOTE — Anesthesia Preprocedure Evaluation (Addendum)
Anesthesia Evaluation  Patient identified by MRN, date of birth, ID band Patient awake    Reviewed: Allergy & Precautions, NPO status , Patient's Chart, lab work & pertinent test results  History of Anesthesia Complications Negative for: history of anesthetic complications  Airway Mallampati: II  TM Distance: >3 FB Neck ROM: Limited    Dental  (+) Dental Advisory Given, Teeth Intact   Pulmonary sleep apnea , former smoker,    breath sounds clear to auscultation       Cardiovascular hypertension (no meds), (-) angina+ CAD, + Past MI, + CABG (2013), + Peripheral Vascular Disease and +CHF  + dysrhythmias Atrial Fibrillation  Rhythm:Irregular Rate:Normal   '20 TTE - Mild concentric LVH. EF 50% to 55%. Grade 2 diastolic dysfunction. Mild AS, trivial AI. Ascending aortic diameter: 37 mm, mildly dilated. Trivial MR. Severely dilated LA. Mild TR.  LAFB with Wenckebach AVB    Neuro/Psych PSYCHIATRIC DISORDERS Depression negative neurological ROS     GI/Hepatic negative GI ROS, Neg liver ROS,   Endo/Other  diabetes, Type 2, Insulin Dependent  Renal/GU Renal disease S/p renal transplant 2013     Musculoskeletal  Gout    Abdominal   Peds  Hematology negative hematology ROS (+)   Anesthesia Other Findings   Reproductive/Obstetrics                           Anesthesia Physical Anesthesia Plan  ASA: III  Anesthesia Plan: General   Post-op Pain Management:    Induction: Intravenous  PONV Risk Score and Plan: 3 and Treatment may vary due to age or medical condition, Ondansetron and Dexamethasone  Airway Management Planned: Oral ETT and Video Laryngoscope Planned  Additional Equipment: None  Intra-op Plan:   Post-operative Plan: Extubation in OR  Informed Consent: I have reviewed the patients History and Physical, chart, labs and discussed the procedure including the risks, benefits and  alternatives for the proposed anesthesia with the patient or authorized representative who has indicated his/her understanding and acceptance.     Dental advisory given  Plan Discussed with: CRNA and Anesthesiologist  Anesthesia Plan Comments: ( )      Anesthesia Quick Evaluation

## 2018-05-04 ENCOUNTER — Ambulatory Visit (HOSPITAL_COMMUNITY): Payer: Medicare Other | Admitting: Physician Assistant

## 2018-05-04 ENCOUNTER — Encounter (HOSPITAL_COMMUNITY): Payer: Self-pay | Admitting: *Deleted

## 2018-05-04 ENCOUNTER — Ambulatory Visit (HOSPITAL_COMMUNITY): Payer: Medicare Other | Admitting: Anesthesiology

## 2018-05-04 ENCOUNTER — Observation Stay (HOSPITAL_COMMUNITY)
Admission: RE | Admit: 2018-05-04 | Discharge: 2018-05-05 | Disposition: A | Discharge status: Home / Self Care | Payer: Medicare Other | Attending: Neurosurgery | Admitting: Neurosurgery

## 2018-05-04 ENCOUNTER — Ambulatory Visit (HOSPITAL_COMMUNITY): Payer: Medicare Other

## 2018-05-04 ENCOUNTER — Encounter (HOSPITAL_COMMUNITY)
Admission: RE | Disposition: A | Discharge status: Home / Self Care | Payer: Self-pay | Source: Home / Self Care | Attending: Neurosurgery

## 2018-05-04 DIAGNOSIS — F329 Major depressive disorder, single episode, unspecified: Secondary | ICD-10-CM | POA: Insufficient documentation

## 2018-05-04 DIAGNOSIS — Z79899 Other long term (current) drug therapy: Secondary | ICD-10-CM | POA: Insufficient documentation

## 2018-05-04 DIAGNOSIS — M4722 Other spondylosis with radiculopathy, cervical region: Secondary | ICD-10-CM | POA: Diagnosis not present

## 2018-05-04 DIAGNOSIS — I509 Heart failure, unspecified: Secondary | ICD-10-CM | POA: Insufficient documentation

## 2018-05-04 DIAGNOSIS — I252 Old myocardial infarction: Secondary | ICD-10-CM | POA: Insufficient documentation

## 2018-05-04 DIAGNOSIS — Z955 Presence of coronary angioplasty implant and graft: Secondary | ICD-10-CM | POA: Insufficient documentation

## 2018-05-04 DIAGNOSIS — I130 Hypertensive heart and chronic kidney disease with heart failure and stage 1 through stage 4 chronic kidney disease, or unspecified chronic kidney disease: Secondary | ICD-10-CM | POA: Insufficient documentation

## 2018-05-04 DIAGNOSIS — Z885 Allergy status to narcotic agent status: Secondary | ICD-10-CM | POA: Insufficient documentation

## 2018-05-04 DIAGNOSIS — E1122 Type 2 diabetes mellitus with diabetic chronic kidney disease: Secondary | ICD-10-CM | POA: Diagnosis not present

## 2018-05-04 DIAGNOSIS — Z833 Family history of diabetes mellitus: Secondary | ICD-10-CM | POA: Insufficient documentation

## 2018-05-04 DIAGNOSIS — M50122 Cervical disc disorder at C5-C6 level with radiculopathy: Secondary | ICD-10-CM | POA: Diagnosis not present

## 2018-05-04 DIAGNOSIS — I739 Peripheral vascular disease, unspecified: Secondary | ICD-10-CM | POA: Insufficient documentation

## 2018-05-04 DIAGNOSIS — Z794 Long term (current) use of insulin: Secondary | ICD-10-CM | POA: Insufficient documentation

## 2018-05-04 DIAGNOSIS — G250 Essential tremor: Secondary | ICD-10-CM | POA: Insufficient documentation

## 2018-05-04 DIAGNOSIS — Z419 Encounter for procedure for purposes other than remedying health state, unspecified: Secondary | ICD-10-CM

## 2018-05-04 DIAGNOSIS — Z951 Presence of aortocoronary bypass graft: Secondary | ICD-10-CM | POA: Insufficient documentation

## 2018-05-04 DIAGNOSIS — Z7982 Long term (current) use of aspirin: Secondary | ICD-10-CM | POA: Insufficient documentation

## 2018-05-04 DIAGNOSIS — N028 Recurrent and persistent hematuria with other morphologic changes: Secondary | ICD-10-CM | POA: Insufficient documentation

## 2018-05-04 DIAGNOSIS — D696 Thrombocytopenia, unspecified: Secondary | ICD-10-CM | POA: Insufficient documentation

## 2018-05-04 DIAGNOSIS — Z87891 Personal history of nicotine dependence: Secondary | ICD-10-CM | POA: Insufficient documentation

## 2018-05-04 DIAGNOSIS — E291 Testicular hypofunction: Secondary | ICD-10-CM | POA: Insufficient documentation

## 2018-05-04 DIAGNOSIS — Z836 Family history of other diseases of the respiratory system: Secondary | ICD-10-CM | POA: Insufficient documentation

## 2018-05-04 DIAGNOSIS — N400 Enlarged prostate without lower urinary tract symptoms: Secondary | ICD-10-CM | POA: Insufficient documentation

## 2018-05-04 DIAGNOSIS — Z86010 Personal history of colonic polyps: Secondary | ICD-10-CM | POA: Insufficient documentation

## 2018-05-04 DIAGNOSIS — M5020 Other cervical disc displacement, unspecified cervical region: Secondary | ICD-10-CM | POA: Diagnosis present

## 2018-05-04 DIAGNOSIS — N182 Chronic kidney disease, stage 2 (mild): Secondary | ICD-10-CM | POA: Insufficient documentation

## 2018-05-04 DIAGNOSIS — Z841 Family history of disorders of kidney and ureter: Secondary | ICD-10-CM | POA: Insufficient documentation

## 2018-05-04 DIAGNOSIS — Z8249 Family history of ischemic heart disease and other diseases of the circulatory system: Secondary | ICD-10-CM | POA: Insufficient documentation

## 2018-05-04 DIAGNOSIS — M109 Gout, unspecified: Secondary | ICD-10-CM | POA: Insufficient documentation

## 2018-05-04 DIAGNOSIS — H409 Unspecified glaucoma: Secondary | ICD-10-CM | POA: Insufficient documentation

## 2018-05-04 DIAGNOSIS — G4733 Obstructive sleep apnea (adult) (pediatric): Secondary | ICD-10-CM | POA: Insufficient documentation

## 2018-05-04 DIAGNOSIS — Z940 Kidney transplant status: Secondary | ICD-10-CM | POA: Insufficient documentation

## 2018-05-04 DIAGNOSIS — Z7901 Long term (current) use of anticoagulants: Secondary | ICD-10-CM | POA: Insufficient documentation

## 2018-05-04 DIAGNOSIS — Z888 Allergy status to other drugs, medicaments and biological substances status: Secondary | ICD-10-CM | POA: Insufficient documentation

## 2018-05-04 DIAGNOSIS — I5022 Chronic systolic (congestive) heart failure: Secondary | ICD-10-CM | POA: Insufficient documentation

## 2018-05-04 DIAGNOSIS — I480 Paroxysmal atrial fibrillation: Secondary | ICD-10-CM | POA: Insufficient documentation

## 2018-05-04 HISTORY — PX: ANTERIOR CERVICAL DECOMP/DISCECTOMY FUSION: SHX1161

## 2018-05-04 LAB — GLUCOSE, CAPILLARY
Glucose-Capillary: 135 mg/dL — ABNORMAL HIGH (ref 70–99)
Glucose-Capillary: 102 mg/dL — ABNORMAL HIGH (ref 70–99)
Glucose-Capillary: 136 mg/dL — ABNORMAL HIGH (ref 70–99)
Glucose-Capillary: 170 mg/dL — ABNORMAL HIGH (ref 70–99)
Glucose-Capillary: 237 mg/dL — ABNORMAL HIGH (ref 70–99)
Glucose-Capillary: 222 mg/dL — ABNORMAL HIGH (ref 70–99)

## 2018-05-04 LAB — PROTIME-INR
INR: 1.04
Prothrombin Time: 13.5 seconds (ref 11.4–15.2)

## 2018-05-04 SURGERY — ANTERIOR CERVICAL DECOMPRESSION/DISCECTOMY FUSION 1 LEVEL
Anesthesia: General

## 2018-05-04 MED ORDER — LATANOPROST 0.005 % OP SOLN
1.0000 [drp] | Freq: Every day | OPHTHALMIC | Status: DC
  Administered 2018-05-04: 1 [drp] via OPHTHALMIC
  Filled 2018-05-04: qty 2.5

## 2018-05-04 MED ORDER — HEMOSTATIC AGENTS (NO CHARGE) OPTIME
TOPICAL | Status: DC | PRN
  Administered 2018-05-04: 1 via TOPICAL

## 2018-05-04 MED ORDER — FENTANYL CITRATE (PF) 100 MCG/2ML IJ SOLN
INTRAMUSCULAR | Status: AC
  Filled 2018-05-04: qty 2

## 2018-05-04 MED ORDER — FENTANYL CITRATE (PF) 250 MCG/5ML IJ SOLN
INTRAMUSCULAR | Status: AC
  Filled 2018-05-04: qty 5

## 2018-05-04 MED ORDER — ACETAMINOPHEN 10 MG/ML IV SOLN
1000.0000 mg | Freq: Once | INTRAVENOUS | Status: AC
  Administered 2018-05-04: 1000 mg via INTRAVENOUS

## 2018-05-04 MED ORDER — BISACODYL 10 MG RE SUPP: 10.0000 mg | Freq: Every day | RECTAL | Status: DC | PRN

## 2018-05-04 MED ORDER — ALLOPURINOL 300 MG PO TABS
300.0000 mg | ORAL_TABLET | Freq: Every day | ORAL | Status: DC
  Filled 2018-05-04: qty 1

## 2018-05-04 MED ORDER — ACETAMINOPHEN 650 MG RE SUPP: 650.0000 mg | RECTAL | Status: DC | PRN

## 2018-05-04 MED ORDER — MYCOPHENOLATE SODIUM 180 MG PO TBEC
360.0000 mg | DELAYED_RELEASE_TABLET | Freq: Two times a day (BID) | ORAL | Status: DC
  Administered 2018-05-04: 360 mg via ORAL
  Filled 2018-05-04 (×2): qty 2

## 2018-05-04 MED ORDER — CHLORHEXIDINE GLUCONATE CLOTH 2 % EX PADS: 6.0000 | MEDICATED_PAD | Freq: Once | CUTANEOUS | Status: DC

## 2018-05-04 MED ORDER — ONDANSETRON HCL 4 MG/2ML IJ SOLN: 4.0000 mg | Freq: Once | INTRAMUSCULAR | Status: DC | PRN

## 2018-05-04 MED ORDER — MORPHINE SULFATE (PF) 4 MG/ML IV SOLN: 4.0000 mg | INTRAVENOUS | Status: DC | PRN

## 2018-05-04 MED ORDER — BUPIVACAINE HCL (PF) 0.5 % IJ SOLN
INTRAMUSCULAR | Status: AC
  Filled 2018-05-04: qty 30

## 2018-05-04 MED ORDER — HYDROXYZINE HCL 25 MG PO TABS: 50.0000 mg | ORAL_TABLET | ORAL | Status: DC | PRN

## 2018-05-04 MED ORDER — THROMBIN 5000 UNITS EX SOLR
CUTANEOUS | Status: AC
  Filled 2018-05-04: qty 15000

## 2018-05-04 MED ORDER — LIDOCAINE 2% (20 MG/ML) 5 ML SYRINGE
INTRAMUSCULAR | Status: DC | PRN
  Administered 2018-05-04: 80 mg via INTRAVENOUS

## 2018-05-04 MED ORDER — OXYCODONE HCL 5 MG PO TABS: 5.0000 mg | ORAL_TABLET | Freq: Once | ORAL | Status: DC | PRN

## 2018-05-04 MED ORDER — FLEET ENEMA 7-19 GM/118ML RE ENEM: 1.0000 | ENEMA | Freq: Once | RECTAL | Status: DC | PRN

## 2018-05-04 MED ORDER — ALUM & MAG HYDROXIDE-SIMETH 200-200-20 MG/5ML PO SUSP: 30.0000 mL | Freq: Four times a day (QID) | ORAL | Status: DC | PRN

## 2018-05-04 MED ORDER — HYDROCODONE-ACETAMINOPHEN 5-325 MG PO TABS
1.0000 | ORAL_TABLET | ORAL | Status: DC | PRN
  Administered 2018-05-04 (×2): 2 via ORAL
  Administered 2018-05-05: 1 via ORAL
  Filled 2018-05-04: qty 1
  Filled 2018-05-04 (×2): qty 2

## 2018-05-04 MED ORDER — SODIUM CHLORIDE 0.9 % IV SOLN
INTRAVENOUS | Status: DC
  Administered 2018-05-04: 10:00:00 via INTRAVENOUS

## 2018-05-04 MED ORDER — HYDROMORPHONE HCL 1 MG/ML IJ SOLN
INTRAMUSCULAR | Status: AC
  Filled 2018-05-04: qty 1

## 2018-05-04 MED ORDER — MAGNESIUM GLUCONATE 500 MG PO TABS
500.0000 mg | ORAL_TABLET | Freq: Two times a day (BID) | ORAL | Status: DC
  Administered 2018-05-04: 500 mg via ORAL
  Filled 2018-05-04 (×2): qty 1

## 2018-05-04 MED ORDER — INSULIN ASPART 100 UNIT/ML ~~LOC~~ SOLN: 0.0000 [IU] | Freq: Three times a day (TID) | SUBCUTANEOUS | Status: DC

## 2018-05-04 MED ORDER — ROCURONIUM BROMIDE 10 MG/ML (PF) SYRINGE
PREFILLED_SYRINGE | INTRAVENOUS | Status: DC | PRN
  Administered 2018-05-04: 50 mg via INTRAVENOUS

## 2018-05-04 MED ORDER — PROPOFOL 10 MG/ML IV BOLUS
INTRAVENOUS | Status: DC | PRN
  Administered 2018-05-04: 160 mg via INTRAVENOUS

## 2018-05-04 MED ORDER — 0.9 % SODIUM CHLORIDE (POUR BTL) OPTIME
TOPICAL | Status: DC | PRN
  Administered 2018-05-04: 1000 mL

## 2018-05-04 MED ORDER — CEFAZOLIN SODIUM-DEXTROSE 2-4 GM/100ML-% IV SOLN
2.0000 g | INTRAVENOUS | Status: AC
  Administered 2018-05-04: 2 g via INTRAVENOUS
  Filled 2018-05-04: qty 100

## 2018-05-04 MED ORDER — CYCLOBENZAPRINE HCL 10 MG PO TABS
ORAL_TABLET | ORAL | Status: AC
  Filled 2018-05-04: qty 1

## 2018-05-04 MED ORDER — HYDROXYZINE HCL 50 MG/ML IM SOLN
50.0000 mg | INTRAMUSCULAR | Status: DC | PRN
  Administered 2018-05-04: 50 mg via INTRAMUSCULAR
  Filled 2018-05-04: qty 1

## 2018-05-04 MED ORDER — BUPIVACAINE HCL (PF) 0.5 % IJ SOLN
INTRAMUSCULAR | Status: DC | PRN
  Administered 2018-05-04: 5 mL

## 2018-05-04 MED ORDER — THROMBIN 5000 UNITS EX SOLR
OROMUCOSAL | Status: DC | PRN
  Administered 2018-05-04: 13:00:00 via TOPICAL

## 2018-05-04 MED ORDER — KETOROLAC TROMETHAMINE 30 MG/ML IJ SOLN
15.0000 mg | Freq: Four times a day (QID) | INTRAMUSCULAR | Status: DC
  Administered 2018-05-04 – 2018-05-05 (×3): 15 mg via INTRAVENOUS
  Filled 2018-05-04 (×2): qty 1

## 2018-05-04 MED ORDER — MAGNESIUM OXIDE 400 (241.3 MG) MG PO TABS
400.0000 mg | ORAL_TABLET | Freq: Two times a day (BID) | ORAL | Status: DC
  Administered 2018-05-04: 400 mg via ORAL
  Filled 2018-05-04: qty 1

## 2018-05-04 MED ORDER — FINASTERIDE 5 MG PO TABS
5.0000 mg | ORAL_TABLET | Freq: Every day | ORAL | Status: DC
  Administered 2018-05-04: 5 mg via ORAL
  Filled 2018-05-04: qty 1

## 2018-05-04 MED ORDER — ATORVASTATIN CALCIUM 40 MG PO TABS
40.0000 mg | ORAL_TABLET | ORAL | Status: DC
  Administered 2018-05-04: 40 mg via ORAL
  Filled 2018-05-04: qty 1

## 2018-05-04 MED ORDER — PROPOFOL 10 MG/ML IV BOLUS
INTRAVENOUS | Status: AC
  Filled 2018-05-04: qty 20

## 2018-05-04 MED ORDER — INSULIN ASPART 100 UNIT/ML ~~LOC~~ SOLN
0.0000 [IU] | Freq: Every day | SUBCUTANEOUS | Status: DC
  Administered 2018-05-04: 2 [IU] via SUBCUTANEOUS

## 2018-05-04 MED ORDER — ONDANSETRON HCL 4 MG/2ML IJ SOLN
INTRAMUSCULAR | Status: DC | PRN
  Administered 2018-05-04: 4 mg via INTRAVENOUS

## 2018-05-04 MED ORDER — SODIUM CHLORIDE 0.9 % IV SOLN: 250.0000 mL | INTRAVENOUS | Status: DC

## 2018-05-04 MED ORDER — TACROLIMUS 0.5 MG PO CAPS
0.5000 mg | ORAL_CAPSULE | Freq: Two times a day (BID) | ORAL | Status: DC
  Administered 2018-05-04: 0.5 mg via ORAL
  Filled 2018-05-04 (×2): qty 1

## 2018-05-04 MED ORDER — KETOROLAC TROMETHAMINE 30 MG/ML IJ SOLN
INTRAMUSCULAR | Status: AC
  Filled 2018-05-04: qty 1

## 2018-05-04 MED ORDER — THROMBIN 5000 UNITS EX SOLR
CUTANEOUS | Status: DC | PRN
  Administered 2018-05-04 (×2): 5000 [IU] via TOPICAL

## 2018-05-04 MED ORDER — LACTATED RINGERS IV SOLN: INTRAVENOUS | Status: DC

## 2018-05-04 MED ORDER — SODIUM CHLORIDE 0.9 % IV SOLN: INTRAVENOUS | Status: DC

## 2018-05-04 MED ORDER — PHENOL 1.4 % MT LIQD: 1.0000 | OROMUCOSAL | Status: DC | PRN

## 2018-05-04 MED ORDER — KETOROLAC TROMETHAMINE 30 MG/ML IJ SOLN
15.0000 mg | Freq: Once | INTRAMUSCULAR | Status: AC
  Administered 2018-05-04: 15 mg via INTRAVENOUS

## 2018-05-04 MED ORDER — LIDOCAINE-EPINEPHRINE 1 %-1:100000 IJ SOLN
INTRAMUSCULAR | Status: DC | PRN
  Administered 2018-05-04: 5 mL via INTRADERMAL

## 2018-05-04 MED ORDER — MENTHOL 3 MG MT LOZG: 1.0000 | LOZENGE | OROMUCOSAL | Status: DC | PRN

## 2018-05-04 MED ORDER — OXYCODONE HCL 5 MG/5ML PO SOLN: 5.0000 mg | Freq: Once | ORAL | Status: DC | PRN

## 2018-05-04 MED ORDER — ACETAMINOPHEN 10 MG/ML IV SOLN
INTRAVENOUS | Status: AC
  Filled 2018-05-04: qty 100

## 2018-05-04 MED ORDER — SUGAMMADEX SODIUM 200 MG/2ML IV SOLN
INTRAVENOUS | Status: DC | PRN
  Administered 2018-05-04: 165 mg via INTRAVENOUS

## 2018-05-04 MED ORDER — MAGNESIUM HYDROXIDE 400 MG/5ML PO SUSP: 30.0000 mL | Freq: Every day | ORAL | Status: DC | PRN

## 2018-05-04 MED ORDER — TACROLIMUS 1 MG PO CAPS
1.0000 mg | ORAL_CAPSULE | Freq: Two times a day (BID) | ORAL | Status: DC
  Administered 2018-05-04: 1 mg via ORAL
  Filled 2018-05-04 (×2): qty 1

## 2018-05-04 MED ORDER — ACETAMINOPHEN 325 MG PO TABS: 650.0000 mg | ORAL_TABLET | ORAL | Status: DC | PRN

## 2018-05-04 MED ORDER — LIDOCAINE-EPINEPHRINE 1 %-1:100000 IJ SOLN
INTRAMUSCULAR | Status: AC
  Filled 2018-05-04: qty 1

## 2018-05-04 MED ORDER — DEXAMETHASONE SODIUM PHOSPHATE 10 MG/ML IJ SOLN
INTRAMUSCULAR | Status: DC | PRN
  Administered 2018-05-04: 4 mg via INTRAVENOUS

## 2018-05-04 MED ORDER — INSULIN ASPART PROT & ASPART (70-30 MIX) 100 UNIT/ML ~~LOC~~ SUSP
22.0000 [IU] | Freq: Every day | SUBCUTANEOUS | Status: DC
  Administered 2018-05-05: 22 [IU] via SUBCUTANEOUS
  Filled 2018-05-04: qty 10

## 2018-05-04 MED ORDER — PHENYLEPHRINE 40 MCG/ML (10ML) SYRINGE FOR IV PUSH (FOR BLOOD PRESSURE SUPPORT)
PREFILLED_SYRINGE | INTRAVENOUS | Status: DC | PRN
  Administered 2018-05-04: 40 ug via INTRAVENOUS

## 2018-05-04 MED ORDER — CYCLOBENZAPRINE HCL 5 MG PO TABS
5.0000 mg | ORAL_TABLET | Freq: Three times a day (TID) | ORAL | Status: DC | PRN
  Administered 2018-05-04: 10 mg via ORAL
  Administered 2018-05-04: 5 mg via ORAL
  Filled 2018-05-04: qty 2

## 2018-05-04 MED ORDER — FENTANYL CITRATE (PF) 100 MCG/2ML IJ SOLN
25.0000 ug | INTRAMUSCULAR | Status: DC | PRN
  Administered 2018-05-04 (×2): 50 ug via INTRAVENOUS

## 2018-05-04 MED ORDER — BUPROPION HCL ER (XL) 150 MG PO TB24
150.0000 mg | ORAL_TABLET | Freq: Every day | ORAL | Status: DC
  Filled 2018-05-04: qty 1

## 2018-05-04 MED ORDER — VITAMIN C 500 MG PO TABS
1000.0000 mg | ORAL_TABLET | Freq: Every day | ORAL | Status: DC
  Administered 2018-05-04: 1000 mg via ORAL
  Filled 2018-05-04: qty 2

## 2018-05-04 MED ORDER — SODIUM CHLORIDE 0.9 % IV SOLN
INTRAVENOUS | Status: DC | PRN
  Administered 2018-05-04: 25 ug/min via INTRAVENOUS

## 2018-05-04 MED ORDER — SODIUM CHLORIDE 0.9% FLUSH: 3.0000 mL | INTRAVENOUS | Status: DC | PRN

## 2018-05-04 MED ORDER — HYDROMORPHONE HCL 1 MG/ML IJ SOLN
0.5000 mg | INTRAMUSCULAR | Status: DC | PRN
  Administered 2018-05-04 (×2): 0.5 mg via INTRAVENOUS

## 2018-05-04 MED ORDER — SODIUM CHLORIDE 0.9% FLUSH: 3.0000 mL | Freq: Two times a day (BID) | INTRAVENOUS | Status: DC

## 2018-05-04 MED ORDER — INSULIN ASPART PROT & ASPART (70-30 MIX) 100 UNIT/ML ~~LOC~~ SUSP
12.0000 [IU] | Freq: Every day | SUBCUTANEOUS | Status: DC
  Administered 2018-05-04: 12 [IU] via SUBCUTANEOUS
  Filled 2018-05-04: qty 10

## 2018-05-04 MED ORDER — FENTANYL CITRATE (PF) 250 MCG/5ML IJ SOLN
INTRAMUSCULAR | Status: DC | PRN
  Administered 2018-05-04 (×5): 50 ug via INTRAVENOUS

## 2018-05-04 MED ORDER — SODIUM CHLORIDE 0.9 % IV SOLN
INTRAVENOUS | Status: DC | PRN
  Administered 2018-05-04: 13:00:00

## 2018-05-04 SURGICAL SUPPLY — 53 items
ALLOGRAFT CA 6X14X11 (Bone Implant) ×2 IMPLANT
BAG DECANTER FOR FLEXI CONT (MISCELLANEOUS) ×2 IMPLANT
BIT DRILL 12X2.5XAVTR (BIT) ×1 IMPLANT
BIT DRILL AVIATOR 12 (BIT) ×1
BIT DRILL NEURO 2X3.1 SFT TUCH (MISCELLANEOUS) ×1 IMPLANT
BIT DRL 12X2.5XAVTR (BIT) ×1
BLADE ULTRA TIP 2M (BLADE) IMPLANT
CANISTER SUCT 3000ML PPV (MISCELLANEOUS) ×2 IMPLANT
CARTRIDGE OIL MAESTRO DRILL (MISCELLANEOUS) ×1 IMPLANT
COVER MAYO STAND STRL (DRAPES) ×2 IMPLANT
COVER WAND RF STERILE (DRAPES) IMPLANT
DECANTER SPIKE VIAL GLASS SM (MISCELLANEOUS) ×2 IMPLANT
DERMABOND ADVANCED (GAUZE/BANDAGES/DRESSINGS) ×1
DERMABOND ADVANCED .7 DNX12 (GAUZE/BANDAGES/DRESSINGS) ×1 IMPLANT
DIFFUSER DRILL AIR PNEUMATIC (MISCELLANEOUS) ×2 IMPLANT
DRAPE HALF SHEET 40X57 (DRAPES) ×2 IMPLANT
DRAPE LAPAROTOMY 100X72 PEDS (DRAPES) ×2 IMPLANT
DRAPE MICROSCOPE LEICA (MISCELLANEOUS) ×2 IMPLANT
DRAPE POUCH INSTRU U-SHP 10X18 (DRAPES) ×2 IMPLANT
DRILL NEURO 2X3.1 SOFT TOUCH (MISCELLANEOUS) ×2
ELECT COATED BLADE 2.86 ST (ELECTRODE) ×2 IMPLANT
ELECT REM PT RETURN 9FT ADLT (ELECTROSURGICAL) ×2
ELECTRODE REM PT RTRN 9FT ADLT (ELECTROSURGICAL) ×1 IMPLANT
GLOVE BIOGEL PI IND STRL 8 (GLOVE) ×2 IMPLANT
GLOVE BIOGEL PI INDICATOR 8 (GLOVE) ×2
GLOVE ECLIPSE 7.5 STRL STRAW (GLOVE) ×4 IMPLANT
GLOVE EXAM NITRILE XL STR (GLOVE) IMPLANT
GOWN STRL REUS W/ TWL LRG LVL3 (GOWN DISPOSABLE) ×1 IMPLANT
GOWN STRL REUS W/ TWL XL LVL3 (GOWN DISPOSABLE) ×1 IMPLANT
GOWN STRL REUS W/TWL 2XL LVL3 (GOWN DISPOSABLE) ×2 IMPLANT
GOWN STRL REUS W/TWL LRG LVL3 (GOWN DISPOSABLE) ×1
GOWN STRL REUS W/TWL XL LVL3 (GOWN DISPOSABLE) ×1
HALTER HD/CHIN CERV TRACTION D (MISCELLANEOUS) ×2 IMPLANT
HEMOSTAT POWDER KIT SURGIFOAM (HEMOSTASIS) ×2 IMPLANT
KIT BASIN OR (CUSTOM PROCEDURE TRAY) ×2 IMPLANT
KIT TURNOVER KIT B (KITS) ×2 IMPLANT
NEEDLE HYPO 25X1 1.5 SAFETY (NEEDLE) ×2 IMPLANT
NEEDLE SPNL 22GX3.5 QUINCKE BK (NEEDLE) ×2 IMPLANT
NS IRRIG 1000ML POUR BTL (IV SOLUTION) ×2 IMPLANT
OIL CARTRIDGE MAESTRO DRILL (MISCELLANEOUS) ×2
PACK LAMINECTOMY NEURO (CUSTOM PROCEDURE TRAY) ×2 IMPLANT
PAD ARMBOARD 7.5X6 YLW CONV (MISCELLANEOUS) ×6 IMPLANT
PLATE AVIATOR ASSY 1LVL SZ 12 (Plate) ×2 IMPLANT
RUBBERBAND STERILE (MISCELLANEOUS) ×4 IMPLANT
SCREW AVIATOR VAR SELFTAP 4X12 (Screw) ×8 IMPLANT
SPONGE INTESTINAL PEANUT (DISPOSABLE) ×2 IMPLANT
SPONGE SURGIFOAM ABS GEL SZ50 (HEMOSTASIS) ×2 IMPLANT
STAPLER SKIN PROX WIDE 3.9 (STAPLE) IMPLANT
SUT VIC AB 2-0 CP2 18 (SUTURE) ×2 IMPLANT
SUT VIC AB 3-0 SH 8-18 (SUTURE) ×4 IMPLANT
TOWEL GREEN STERILE (TOWEL DISPOSABLE) ×2 IMPLANT
TOWEL GREEN STERILE FF (TOWEL DISPOSABLE) ×2 IMPLANT
WATER STERILE IRR 1000ML POUR (IV SOLUTION) ×2 IMPLANT

## 2018-05-04 NOTE — Transfer of Care (Signed)
Immediate Anesthesia Transfer of Care Note  Patient: Robert Arnold  Procedure(s) Performed: Cervical Five-Six Anterior cervical decompression/discectomy/fusion (N/A )  Patient Location: PACU  Anesthesia Type:General  Level of Consciousness: awake, alert  and oriented  Airway & Oxygen Therapy: Patient Spontanous Breathing and Patient connected to nasal cannula oxygen  Post-op Assessment: Report given to RN and Post -op Vital signs reviewed and stable  Post vital signs: Reviewed and stable  Last Vitals:  Vitals Value Taken Time  BP 155/78 05/04/2018  2:25 PM  Temp    Pulse 82 05/04/2018  2:26 PM  Resp 25 05/04/2018  2:26 PM  SpO2 93 % 05/04/2018  2:26 PM  Vitals shown include unvalidated device data.  Last Pain:  Vitals:   05/04/18 1016  TempSrc:   PainSc: 4       Patients Stated Pain Goal: 2 (59/47/07 6151)  Complications: No apparent anesthesia complications

## 2018-05-04 NOTE — Progress Notes (Signed)
Vitals:   05/04/18 1455 05/04/18 1510 05/04/18 1525 05/04/18 1557  BP: (!) 111/55 (!) 106/52 120/74 136/87  Pulse: 85 87 69 79  Resp: 17 15 16 19   Temp:   97.7 F (36.5 C) 97.7 F (36.5 C)  TempSrc:      SpO2: 96% 97% 93% 95%  Weight:      Height:        Patient resting in bed, has ambulated down the hall.  No void yet.  Wound clean and dry; no swelling, erythema, ecchymosis, or drainage.  Patient feels strength in left upper extremity may already be improved, but he is still having discomfort through the neck shoulder and arm.  Plan: Courage to ambulate.  Continue to progress through postoperative recovery.  Hosie Spangle, MD 05/04/2018, 6:46 PM

## 2018-05-04 NOTE — H&P (Signed)
Subjective: Patient is a 73 y.o. right-handed white  male who is admitted for treatment of left cervical radiculopathy secondary to a spondylitic C5-6 cervical disc herniation associated with underlying cervical degenerative disc disease and spondylosis.  Exam showed significant weakness in the left biceps triceps and grip.  His pain extends from the left suprascapular and trapezius region into the left side of his neck as well as into the left shoulder and arm.  Patient admitted now for a C5-6 anterior cervical decompression and arthrodesis.   Patient Active Problem List   Diagnosis Date Noted  . Type 2 diabetes mellitus with stage 2 chronic kidney disease, with long-term current use of insulin ([place]) 02/27/2018  . Testosterone deficiency 02/27/2018  . FHx: heart disease 02/27/2018  . Former smoker 02/27/2018  . Senile purpura ([place]) 01/23/2018  . Overweight (BMI 25.0-29.9) 08/28/2017  . Essential tremor 04/03/2017  . 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  . CAD (coronary artery disease) of artery bypass graft 01/11/2016  . Thrombocytopenia ([place]) 09/26/2015  . Depression, major, in remission ([place]) 06/29/2014  . Renal Transplant, s/p 09/2011 11/26/2013  . Vitamin D deficiency 08/17/2013  . Anticoagulant long-term use 08/17/2013  . CKD stage 2 due to type 2 diabetes mellitus ([place])   . Hyperlipidemia, mixed   . BPH with obstruction/lower urinary tract symptoms   . Gout   . PAF (paroxysmal atrial fibrillation) ([place])   . OSA (obstructive sleep apnea)   . Histoplasmosis   . Essential hypertension   . Chronic systolic heart failure ([place]) 11/30/2011   Past Medical History:  Diagnosis Date  . Adenomatous colon polyp   . Allergy   . BPH (benign prostatic hyperplasia)   . CHF (congestive heart failure) ([place])   . 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 Diabetes mellitus type 2, controlled ([place])   . Glaucoma   . Gout   . Histoplasmosis    on itraconazole for  prophylaxis  . MI (myocardial infarction) ([place]) 09/26/2011  . NSTEMI (non-ST elevated myocardial infarction) ([place]) 11/26/2013   2013   . PAF (paroxysmal atrial fibrillation) ([place])    s/p DC-CV in 6/13. Off coumadin due to ureteral bleed    Past Surgical History:  Procedure Laterality Date  . AV FISTULA PLACEMENT  2011   Left forearm  . COLONOSCOPY    . CORONARY ANGIOPLASTY WITH STENT PLACEMENT    . CORONARY ARTERY BYPASS GRAFT  2013  . KIDNEY TRANSPLANT  09/25/2011  . PERITONEAL CATHETER INSERTION  2011    Medications Prior to Admission  Medication Sig Dispense Refill Last Dose  . allopurinol (ZYLOPRIM) 300 MG tablet TAKE 1 TABLET BY MOUTH DAILY FOR GOUT PREVENTION (Patient taking differently: Take 300 mg by mouth daily. TAKE 1 TABLET BY MOUTH DAILY FOR GOUT PREVENTION) 90 tablet 3 05/04/2018 at 0900  . Ascorbic Acid (VITAMIN C) 1000 MG tablet Take 1,000 mg by mouth daily.   05/03/2018 at Unknown time  . aspirin EC 81 MG tablet Take 81 mg by mouth daily.   Past Week at Unknown time  . atorvastatin (LIPITOR) 80 MG tablet Take 40 mg by mouth 3 (three) times a week.   05/03/2018 at Unknown time  . Black Pepper-Turmeric (TURMERIC COMPLEX/BLACK PEPPER PO) Take 1,500 mg by mouth daily.   Past Week at Unknown time  . buPROPion (WELLBUTRIN XL) 150 MG 24 hr tablet Take 150 mg by mouth daily.   05/04/2018 at 0900  . Cholecalciferol (VITAMIN D3)  125 MCG (5000 UT) CAPS Take 2 caps (10,000 units) daily 1 capsule 0 05/03/2018 at Unknown time  . finasteride (PROSCAR) 5 MG tablet TAKE 1 TABLET BY MOUTH ONCE DAILY 90 tablet 1 05/03/2018 at Unknown time  . insulin NPH-regular Human (NOVOLIN 70/30) (70-30) 100 UNIT/ML injection Take 20-30 units 2 x day or as directed (Patient taking differently: Inject 12-22 Units into the skin See admin instructions. Injects 22 units in the AM and 12 units in the PM.) 10 mL 99 05/03/2018 at Unknown time  . latanoprost (XALATAN) 0.005 % ophthalmic solution Place 1 drop into both  eyes at bedtime.   4 05/03/2018 at Unknown time  . magnesium gluconate (MAGONATE) 500 MG tablet Take 500 mg by mouth 2 (two) times daily.   05/03/2018 at Unknown time  . MAGNESIUM-OXIDE 400 (241.3 Mg) MG tablet Take 400 mg by mouth 2 (two) times daily.   6 05/03/2018 at Unknown time  . mycophenolate (MYFORTIC) 360 MG TBEC EC tablet TAKE 1 TABLET BY MOUTH TWICE DAILY (Patient taking differently: Take 360 mg by mouth 2 (two) times daily. ) 180 tablet 1 05/04/2018 at 0900  . Probiotic Product (PROBIOTIC DAILY PO) Take 1 capsule by mouth daily.   05/03/2018 at Unknown time  . tacrolimus (PROGRAF) 0.5 MG capsule Take 0.5 mg by mouth 2 (two) times daily.   05/04/2018 at 0900  . tacrolimus (PROGRAF) 1 MG capsule Take 1 mg by mouth 2 (two) times daily.   05/04/2018 at 0900  . warfarin (COUMADIN) 5 MG tablet TAKE 1 TO 2 TABLETS BY MOUTH ONCE DAILY AS DIRECTED (Patient taking differently: Take 5-7.5 mg by mouth See admin instructions. 7.5 mg Mon and Thurs, and 5 mg all other days) 180 tablet 1 05/01/2018  . Zinc 50 MG CAPS Take 50 mg by mouth daily.   05/03/2018 at Unknown time  . betamethasone dipropionate (DIPROLENE) 0.05 % cream Apply 1 application topically daily as needed (eczema).    More than a month at Unknown time  . glucose blood (FREESTYLE TEST STRIPS) test strip Check blood sugar 3 to 4 times daily for medication regulation. 450 each PRN Taking   Allergies  Allergen Reactions  . Losartan Anaphylaxis  . Crestor [Rosuvastatin]     Elevated LFT's  . Lorazepam     Pt is unsure of reaction   . Morphine And Related Other (See Comments)    Has no effect on pt     Social History   Tobacco Use  . Smoking status: Former Smoker    Last attempt to quit: 11/29/1990    Years since quitting: 27.4  . Smokeless tobacco: Never Used  Substance Use Topics  . Alcohol use: Yes    Comment: 1-2 a month    Family History  Problem Relation Age of Onset  . Heart attack Father   . Diabetes Mother   . COPD Mother    . Kidney disease Sister   . Colon cancer Neg Hx   . Stomach cancer Neg Hx   . Rectal cancer Neg Hx   . Esophageal cancer Neg Hx   . Liver cancer Neg Hx      Review of Systems Pertinent items noted in HPI and remainder of comprehensive ROS otherwise negative.  Objective: Vital signs in last 24 hours: Temp:  [97.7 F (36.5 C)] 97.7 F (36.5 C) (01/27 1001) Pulse Rate:  [70] 70 (01/27 1001) Resp:  [18] 18 (01/27 1001) SpO2:  [100 %] 100 % (01/27 1001)  Weight:  [81.6 kg-86.2 kg] 81.6 kg (01/27 1002)  EXAM: Patient is a well-developed well-nourished white male in no acute distress. Lungs are clear to auscultation , the patient has symmetrical respiratory excursion. Heart has a regular rate and rhythm normal S1 and S2 no murmur.   Abdomen is soft nontender nondistended bowel sounds are present. Extremity examination shows no clubbing cyanosis or edema. Examination shows 5/5 strength in the right upper extremity including deltoid, biceps, triceps, intrinsics, and grip.  However there is significant weakness in the left upper extremity with the deltoid being 5, biceps 4, triceps 4-, grip 4-, and intrinsics 5.  Sensation is intact to pinprick in the distal upper extremities.  Reflex examination shows the left biceps and brachioradialis are absent, the right biceps and brachioradialis are 1.  Left triceps is 1, right triceps is trace.  Left quadriceps is 1, right quadriceps is trace.  Gastrocnemius is absent bilaterally.  Toes are downgoing bilaterally.  He has a normal gait and stance.  Data Review:CBC    Component Value Date/Time   WBC 7.5 05/01/2018 0918   RBC 4.92 05/01/2018 0918   HGB 15.5 05/01/2018 0918   HCT 46.7 05/01/2018 0918   PLT 197 05/01/2018 0918   MCV 94.9 05/01/2018 0918   MCH 31.5 05/01/2018 0918   MCHC 33.2 05/01/2018 0918   RDW 12.3 05/01/2018 0918   LYMPHSABS 917 03/03/2018 1405   MONOABS 270 11/18/2016 1120   EOSABS 48 03/03/2018 1405   BASOSABS 48 03/03/2018  1405                          BMET    Component Value Date/Time   NA 138 05/01/2018 0918   NA 141 03/15/2013 0744   K 4.4 05/01/2018 0918   CL 104 05/01/2018 0918   CO2 25 05/01/2018 0918   GLUCOSE 111 (H) 05/01/2018 0918   BUN 22 05/01/2018 0918   BUN 16 03/15/2013 0744   CREATININE 1.06 05/01/2018 0918   CREATININE 1.12 03/03/2018 1405   CALCIUM 9.9 05/01/2018 0918   GFRNONAA >60 05/01/2018 0918   GFRNONAA 67 03/03/2018 1405   GFRAA >60 05/01/2018 0918   GFRAA 77 03/03/2018 1405     Assessment/Plan: Patient with acute left cervical radiculopathy with weakness of the left biceps, triceps, and grip with spinal disc herniation at C5-6 who is admitted for a C5-6 anterior cervical decompression and arthrodesis.  I've discussed with the patient the nature of his condition, the nature the surgical procedure, the typical length of surgery, hospital stay, and overall recuperation. We discussed limitations postoperatively. I discussed risks of surgery including risks of infection, bleeding, possibly need for transfusion, the risk of nerve root dysfunction with pain, weakness, numbness, or paresthesias, the risk of spinal cord dysfunction with paralysis of all 4 limbs and quadriplegia, and the risk of dural tear and CSF leakage and possible need for further surgery, the risk of esophageal dysfunction causing dysphagia and the risk of laryngeal dysfunction causing hoarseness of the voice, the risk of failure of the arthrodesis and the possible need for further surgery, and the risk of anesthetic complications including myocardial infarction, stroke, pneumonia, and death. We also discussed the need for postoperative immobilization in a cervical collar. Understanding all this the patient does wish to proceed with surgery and is admitted for such.  Hosie Spangle, MD 05/04/2018 11:51 AM

## 2018-05-04 NOTE — Anesthesia Procedure Notes (Signed)
Procedure Name: Intubation Date/Time: 05/04/2018 12:23 PM Performed by: Wilburn Cornelia, CRNA Pre-anesthesia Checklist: Patient identified, Emergency Drugs available, Suction available and Patient being monitored Patient Re-evaluated:Patient Re-evaluated prior to induction Oxygen Delivery Method: Circle System Utilized Preoxygenation: Pre-oxygenation with 100% oxygen Induction Type: IV induction Ventilation: Mask ventilation without difficulty Laryngoscope Size: Glidescope and 4 Grade View: Grade I Tube type: Oral Tube size: 7.5 mm Number of attempts: 1 Airway Equipment and Method: Stylet and Oral airway Placement Confirmation: ETT inserted through vocal cords under direct vision,  positive ETCO2 and breath sounds checked- equal and bilateral Secured at: 23 cm Tube secured with: Tape Dental Injury: Teeth and Oropharynx as per pre-operative assessment

## 2018-05-04 NOTE — Op Note (Signed)
05/04/2018  2:22 PM  PATIENT:  Robert Arnold  73 y.o. male  PRE-OPERATIVE DIAGNOSIS: C5-6 spondylitic cervical disc herniation, cervical spondylosis, cervical degenerative disease, left cervical radiculopathy  POST-OPERATIVE DIAGNOSIS:  C5-6 spondylitic cervical disc herniation, cervical spondylosis, cervical degenerative disease, left cervical radiculopathy  PROCEDURE:  Procedure( s): C5-6 anterior cervical decompression and arthrodesis with structural allograft and aviator cervical plating  SURGEON:  Surgeon(s): Jovita Gamma, MD  ASSISTANTS: Newman Pies, MD  ANESTHESIA:   general  EBL:  Total I/O In: -  Out: 25 [Blood:25]  BLOOD ADMINISTERED:none  COUNT:  Correct per nursing staff  DICTATION: Patient was brought to the operating room placed under general endotracheal anesthesia. Patient was placed in 10 pounds of halter traction. The neck was prepped with Betadine soap and solution and draped in a sterile fashion. A horizontal incision was made on the left side of the neck. The line of the incision was infiltrated with local anesthetic with epinephrine. Dissection was carried down thru the subcutaneous tissue and platysma, bipolar cautery was used to maintain hemostasis. Dissection was then carried down thru an avascular plane leaving the sternocleidomastoid carotid artery and jugular vein laterally and the trachea and esophagus medially. The ventral aspect of the vertebral column was identified and a localizing x-ray was taken. The C5-6 level was identified. The annulus was incised and the disc space entered. Discectomy was performed with micro-curettes and pituitary rongeurs. The operating microscope was draped and brought into the field provided additional magnification illumination and visualization. Discectomy was continued posteriorly thru the disc space and then the cartilaginous endplate was removed using micro-curettes along with the high-speed drill. Posterior  osteophytic overgrowth was removed using the high-speed drill along with 1 mm and 2 mm thin footplated Kerrison punches.  The posterior longitudinal ligament along with disc herniation was carefully removed, decompressing the spinal canal and thecal sac. We then continued to remove osteophytic overgrowth and disc material decompressing the neural foramina and exiting nerve roots bilaterally. Once the decompression was completed hemostasis was established with the use of Gelfoam with thrombin and bipolar cautery. The Gelfoam was removed, a thin layer of Surgifoam was applied, the wound irrigated and hemostasis confirmed. We then measured the height of the intravertebral disc space and selected a 6 millimeter in height structural allograft. It was hydrated and saline solution and then gently positioned in the intravertebral disc space and countersunk. We then selected a 12 millimeter in height Aviator cervical plate. It was positioned over the fusion construct and secured to the vertebra with 4 x 12 mm screws at the C5 level, and 4 x 12 mm screws at the C6 level. Each screw hole was started with the high-speed drill and then the screws placed once all the screws were placed, the locking system was secured. The wound was irrigated with bacitracin solution checked for hemostasis which was established and confirmed. An x-ray was taken which showed the graft in good position, the plate and screws in good position, and the overall construct looked good. We then proceeded with closure. The platysma was closed with interrupted inverted 2-0 undyed Vicryl suture, the subcutaneous and subcuticular closed with interrupted inverted 3-0 undyed Vicryl suture. The skin edges were approximated with Dermabond. Following surgery the patient was taken out of cervical traction. To be reversed and the anesthetic and taken to the recovery room for further care.  PLAN OF CARE: Admit for overnight observation  PATIENT DISPOSITION:  PACU  - hemodynamically stable.   Delay start of  Pharmacological VTE agent (>24hrs) due to surgical blood loss or risk of bleeding:  yes

## 2018-05-04 NOTE — Anesthesia Postprocedure Evaluation (Signed)
Anesthesia Post Note  Patient: Robert Arnold  Procedure(s) Performed: Cervical Five-Six Anterior cervical decompression/discectomy/fusion (N/A )     Patient location during evaluation: PACU Anesthesia Type: General Level of consciousness: awake and alert Pain management: pain level controlled Vital Signs Assessment: post-procedure vital signs reviewed and stable Respiratory status: spontaneous breathing, nonlabored ventilation and respiratory function stable Cardiovascular status: blood pressure returned to baseline and stable Postop Assessment: no apparent nausea or vomiting Anesthetic complications: no    Last Vitals:  Vitals:   05/04/18 1525 05/04/18 1557  BP: 120/74 136/87  Pulse: 69 79  Resp: 16 19  Temp: 36.5 C 36.5 C  SpO2: 93% 95%    Last Pain:  Vitals:   05/04/18 1525  TempSrc:   PainSc: 3     LLE Motor Response: Purposeful movement (05/04/18 1628) LLE Sensation: Full sensation (05/04/18 1628) RLE Motor Response: Purposeful movement (05/04/18 1628) RLE Sensation: Full sensation (05/04/18 1628)      Audry Pili

## 2018-05-05 ENCOUNTER — Encounter (HOSPITAL_COMMUNITY): Payer: Self-pay | Admitting: Neurosurgery

## 2018-05-05 DIAGNOSIS — M50122 Cervical disc disorder at C5-C6 level with radiculopathy: Secondary | ICD-10-CM | POA: Diagnosis not present

## 2018-05-05 LAB — GLUCOSE, CAPILLARY: Glucose-Capillary: 249 mg/dL — ABNORMAL HIGH (ref 70–99)

## 2018-05-05 MED ORDER — HYDROCODONE-ACETAMINOPHEN 5-325 MG PO TABS: 1.0000 | ORAL_TABLET | ORAL | 0 refills | Status: DC | PRN

## 2018-05-05 NOTE — Discharge Instructions (Signed)
  Call Your Doctor If Any of These Occur Redness, drainage, or swelling at the wound.  Temperature greater than 101 degrees. Severe pain not relieved by pain medication. Increased difficulty swallowing. Incision starts to come apart. Follow Up Appt Call today for appointment in 3 weeks ([phone]) or for problems.  If you have any hardware placed in your spine, you will need an x-ray before your appointment.   

## 2018-05-05 NOTE — Discharge Summary (Signed)
Physician Discharge Summary  Patient ID: Robert Arnold MRN: [id number] DOB/AGE: 71/30/1947 71 y.o.  Admit date: 05/04/2018 Discharge date: 05/05/2018  Admission Diagnoses:  C5-6 spondylitic cervical disc herniation, cervical spondylosis, cervical degenerative disease, left cervical radiculopathy  Discharge Diagnoses:  C5-6 spondylitic cervical disc herniation, cervical spondylosis, cervical degenerative disease, left cervical radiculopathy Active Problems:   HNP (herniated nucleus pulposus), cervical   Discharged Condition: good  Hospital Course: Patient admitted, underwent a C5-6 ACDF.  Postoperatively his left cervical radiculopathy steadily improved.  He is up and ambulating actively.  He is voiding well.  His incision is healing nicely.  He is being discharged home with instructions regarding wound care and activities.  He is scheduled for follow-up with me in 2 weeks.  He has been instructed to resume his warfarin in 10 days if he is doing well and his wound is healing well.  Discharge Exam: Blood pressure 134/79, pulse 70, temperature 97.7 F (36.5 C), temperature source Oral, resp. rate 18, height 5\' 8"  (1.727 m), weight 81.6 kg, SpO2 96 %.  Disposition: Discharge disposition: 01-Home or Self Care       Discharge Instructions    Discharge wound care:   Complete by:  As directed    Leave the wound open to air. Shower daily with the wound uncovered. Water and soapy water should run over the incision area. Do not wash directly on the incision for 2 weeks. Remove the glue after 2 weeks.   Driving Restrictions   Complete by:  As directed    No driving for 2 weeks. May ride in the car locally now. May begin to drive locally in 2 weeks.   Other Restrictions   Complete by:  As directed    Walk gradually increasing distances out in the fresh air at least twice a day. Walking additional 6 times inside the house, gradually increasing distances, daily. No bending, lifting, or  twisting. Perform activities between shoulder and waist height (that is at counter height when standing or table height when sitting).     Allergies as of 05/05/2018      Reactions   Losartan Anaphylaxis   Crestor [rosuvastatin]    Elevated LFT's   Lorazepam    Pt is unsure of reaction    Morphine And Related Other (See Comments)   Has no effect on pt       Medication List    TAKE these medications   allopurinol 300 MG tablet Commonly known as:  ZYLOPRIM TAKE 1 TABLET BY MOUTH DAILY FOR GOUT PREVENTION What changed:  See the new instructions.   aspirin EC 81 MG tablet Take 81 mg by mouth daily.   atorvastatin 80 MG tablet Commonly known as:  LIPITOR Take 40 mg by mouth 3 (three) times a week.   betamethasone dipropionate 0.05 % cream Commonly known as:  DIPROLENE Apply 1 application topically daily as needed (eczema).   buPROPion 150 MG 24 hr tablet Commonly known as:  WELLBUTRIN XL Take 150 mg by mouth daily.   finasteride 5 MG tablet Commonly known as:  PROSCAR TAKE 1 TABLET BY MOUTH ONCE DAILY   glucose blood test strip Commonly known as:  FREESTYLE TEST STRIPS Check blood sugar 3 to 4 times daily for medication regulation.   HYDROcodone-acetaminophen 5-325 MG tablet Commonly known as:  NORCO/VICODIN Take 1-2 tablets by mouth every 4 (four) hours as needed (pain).   insulin NPH-regular Human (70-30) 100 UNIT/ML injection Commonly known as:  NOVOLIN 70/30 Take  20-30 units 2 x day or as directed What changed:    how much to take  how to take this  when to take this  additional instructions   latanoprost 0.005 % ophthalmic solution Commonly known as:  XALATAN Place 1 drop into both eyes at bedtime.   magnesium gluconate 500 MG tablet Commonly known as:  MAGONATE Take 500 mg by mouth 2 (two) times daily.   MAGNESIUM-OXIDE 400 (241.3 Mg) MG tablet Generic drug:  magnesium oxide Take 400 mg by mouth 2 (two) times daily.   mycophenolate 360 MG Tbec  EC tablet Commonly known as:  MYFORTIC TAKE 1 TABLET BY MOUTH TWICE DAILY   PROBIOTIC DAILY PO Take 1 capsule by mouth daily.   tacrolimus 1 MG capsule Commonly known as:  PROGRAF Take 1 mg by mouth 2 (two) times daily.   tacrolimus 0.5 MG capsule Commonly known as:  PROGRAF Take 0.5 mg by mouth 2 (two) times daily.   TURMERIC COMPLEX/BLACK PEPPER PO Take 1,500 mg by mouth daily.   vitamin C 1000 MG tablet Take 1,000 mg by mouth daily.   Vitamin D3 125 MCG (5000 UT) Caps Take 2 caps (10,000 units) daily   warfarin 5 MG tablet Commonly known as:  COUMADIN TAKE 1 TO 2 TABLETS BY MOUTH ONCE DAILY AS DIRECTED What changed:  See the new instructions.   Zinc 50 MG Caps Take 50 mg by mouth daily.            Discharge Care Instructions  (From admission, onward)         Start     Ordered   05/05/18 0000  Discharge wound care:    Comments:  Leave the wound open to air. Shower daily with the wound uncovered. Water and soapy water should run over the incision area. Do not wash directly on the incision for 2 weeks. Remove the glue after 2 weeks.   05/05/18 0835           Signed: Hosie Spangle 05/05/2018, 8:35 AM

## 2018-05-05 NOTE — Progress Notes (Signed)
Patient alert and oriented, mae's well, voiding adequate amount of urine, swallowing without difficulty, no c/o pain at time of discharge. Patient discharged home with family. Script and discharged instructions given to patient. Patient and family stated understanding of instructions given. Patient has an appointment with Dr. Nudelman 

## 2018-05-14 ENCOUNTER — Other Ambulatory Visit: Payer: Self-pay | Admitting: Internal Medicine

## 2018-05-14 DIAGNOSIS — I482 Chronic atrial fibrillation, unspecified: Secondary | ICD-10-CM

## 2018-05-22 ENCOUNTER — Encounter: Payer: Self-pay | Admitting: Adult Health Nurse Practitioner

## 2018-05-22 ENCOUNTER — Ambulatory Visit: Payer: Self-pay | Admitting: Physician Assistant

## 2018-05-22 ENCOUNTER — Ambulatory Visit (INDEPENDENT_AMBULATORY_CARE_PROVIDER_SITE_OTHER): Payer: Medicare Other | Admitting: Adult Health Nurse Practitioner

## 2018-05-22 VITALS — BP 122/62 | HR 80 | Temp 97.5°F | Ht 68.0 in | Wt 185.0 lb

## 2018-05-22 DIAGNOSIS — I2510 Atherosclerotic heart disease of native coronary artery without angina pectoris: Secondary | ICD-10-CM | POA: Diagnosis not present

## 2018-05-22 DIAGNOSIS — Z7901 Long term (current) use of anticoagulants: Secondary | ICD-10-CM

## 2018-05-22 DIAGNOSIS — E1122 Type 2 diabetes mellitus with diabetic chronic kidney disease: Secondary | ICD-10-CM

## 2018-05-22 DIAGNOSIS — I480 Paroxysmal atrial fibrillation: Secondary | ICD-10-CM | POA: Diagnosis not present

## 2018-05-22 DIAGNOSIS — N138 Other obstructive and reflux uropathy: Secondary | ICD-10-CM

## 2018-05-22 DIAGNOSIS — I10 Essential (primary) hypertension: Secondary | ICD-10-CM | POA: Diagnosis not present

## 2018-05-22 DIAGNOSIS — Z794 Long term (current) use of insulin: Secondary | ICD-10-CM

## 2018-05-22 DIAGNOSIS — Z940 Kidney transplant status: Secondary | ICD-10-CM

## 2018-05-22 DIAGNOSIS — E782 Mixed hyperlipidemia: Secondary | ICD-10-CM

## 2018-05-22 DIAGNOSIS — D692 Other nonthrombocytopenic purpura: Secondary | ICD-10-CM

## 2018-05-22 DIAGNOSIS — N182 Chronic kidney disease, stage 2 (mild): Secondary | ICD-10-CM

## 2018-05-22 DIAGNOSIS — N401 Enlarged prostate with lower urinary tract symptoms: Secondary | ICD-10-CM

## 2018-05-22 DIAGNOSIS — Z981 Arthrodesis status: Secondary | ICD-10-CM | POA: Diagnosis not present

## 2018-05-22 DIAGNOSIS — F325 Major depressive disorder, single episode, in full remission: Secondary | ICD-10-CM

## 2018-05-22 NOTE — Progress Notes (Signed)
FOLLOW UP  Assessment and Plan:   Pedro was seen today for follow-up and labs only.  Diagnoses and all orders for this visit:  PAF (paroxysmal atrial fibrillation) (HCC) Chronic anticoagulation Check INR and will adjust medication according to labs.  Discussed if patient falls to immediately contact office or go to ER. Discussed foods that can increase or decrease Coumadin levels. Patient understands to call the office before starting a new medication. Follow up based on reults  Anticoagulant long-term use Coumadin 5mg , Tues, Wed, Fri-Sun and 7.5mg  Mon & Thurs. Continue this. -     Protime-INR  S/P cervical spinal fusion Has follow up with Dr Sherwood Gambler in 2 weeks Reinforced incision care and S&S of infection Pain well controlled Continue to increase activity  Essential hypertension Well controlled with current medications  Monitor blood pressure at home; patient to call if consistently greater than 130/80 Continue DASH diet.   Reminder to go to the ER if any CP, SOB, nausea, dizziness, severe HA, changes vision/speech, left arm numbness and tingling and jaw pain.   CKD stage 2 due to type 2 diabetes mellitus (HCC) Increase fluids, avoid NSAIDS, monitor sugars, will monitor  Type 2 diabetes mellitus with stage 2 chronic kidney disease, with long-term current use of insulin (HCC) Continue medications: Novolog 70/30, 20-30 units BID. Continue diet and exercise.  Perform daily foot/skin check, notify office of any concerning changes.  Check A1C  BPH with obstruction/lower urinary tract symptoms Doing well on current regiment, continue with benefit Finasteride 67m daily  Depression, major, in remission ([place]) Doing well on current regiment, continue with benefit Wellbutrin 150mg  daily  Senile purpura ([place]) Increased related to anticoagulation therapy Continue to monitor  Hyperlipidemia, mixed Discussed dietary and exercise modifications Doing well on current regiment,  continue with benefit  Renal Transplant, s/p 09/2011 Continue current therapies and follow up with [facility] as scheduled.  Discussed hospital precautions with patient and agrees with plan of care.  Follow up in one month for routine chronic management.  May nee to return sooner based on INR results.  Will contact patient in one day via MyChart per request.  Call or return with new or worsening symptoms as discussed in appointment.  May contact via office phone [phone] or via [place].   Continue diet and meds as discussed. Further disposition pending results of labs. Discussed med's effects and SE's.   Over 30 minutes of exam, counseling, chart review, and critical decision making was performed.   Future Appointments  Date Time Provider [place]  06/30/2018  9:30 AM Liane Comber, NP [facility] None  07/30/2018  9:45 AM [name], MD [facility] None  03/17/2019  9:00 AM [name], MD [facility] None    ----------------------------------------------------------------------------------------------------------------------  HPI 73 y.o. male  presents for 3 month follow up on hypertension, cholesterol, diabetes, weight and vitamin D deficiency. He recently has ACDF C5-C6 and is doing well.  He reports his pain is well controlled and not taking pain medication on a regular basis.  He does take tylenol every now and again.  Reports the pain to his left shoulder has dramatiacally decreased.  He is experiencing a 2/10 at times and reports this is nothing compared to pre-surgery.  He is on long term anticoagulation related to atrial fibrillation.  He stopped his Coumadin prior to surgery.  There was not any time of anticoagulation bridge therapy at discharge,  He has restarted his coumadin 7 days ago and here to check this level today.  He is currently taking Coumadin one tablet, 5mg  five days a week on Tues, Wed, Fri-Sun.  He takes Coumadin one and a half tablets,  7.5mg , on Mon and Thurs. He denies and S&S of bleeding or any falls. Reports that he has difficultly sleeping and only sleep 3-4 hours a night and not consecutive.  He has taken OTC melatonin for this and it has not helped.  Reports he feels tired and unrested all day.  He then has to nap for an hour or so to be able to function remainder of the day.  Reports he has had an increase in sinus drainage over the past couple weeks.  He has not taken anything for this but has taken zyrtec in the past.  Denies any water eyes, sore throat or cough.  BMI is Body mass index is 28.13 kg/m., he has not been working on diet and exercise. Wt Readings from Last 3 Encounters:  05/22/18 185 lb (83.9 kg)  05/04/18 180 lb (81.6 kg)  05/01/18 188 lb 7.9 oz (85.5 kg)    His blood pressure has been controlled at home, today their BP is BP: 122/62  He does not workout at this time but has been gradually increasing walking s/p ACDF.Marland Kitchen He denies chest pain, shortness of breath, dizziness.   He is on cholesterol medication Atorvastatin and denies myalgias. His cholesterol is at goal. The cholesterol last visit was:   Lab Results  Component Value Date   CHOL 143 03/03/2018   HDL 65 03/03/2018   LDLCALC 58 03/03/2018   TRIG 118 03/03/2018   CHOLHDL 2.2 03/03/2018    He has been working on diet and exercise for prediabetes prior to his surgery, and denies hyperglycemia, hypoglycemia , increased appetite, nausea, paresthesia of the feet, polydipsia, polyuria, visual disturbances and vomiting. Last A1C in the office was:  Lab Results  Component Value Date   HGBA1C 6.3 (H) 05/01/2018   Patient is on Vitamin D supplement.   Lab Results  Component Value Date   VD25OH 114 (H) 03/03/2018        Current Medications:  Current Outpatient Medications on File Prior to Visit  Medication Sig  . allopurinol (ZYLOPRIM) 300 MG tablet TAKE 1 TABLET BY MOUTH DAILY FOR GOUT PREVENTION (Patient taking differently: Take 300 mg  by mouth daily. TAKE 1 TABLET BY MOUTH DAILY FOR GOUT PREVENTION)  . Ascorbic Acid (VITAMIN C) 1000 MG tablet Take 1,000 mg by mouth daily.  Marland Kitchen aspirin EC 81 MG tablet Take 81 mg by mouth daily.  Marland Kitchen atorvastatin (LIPITOR) 80 MG tablet Take 40 mg by mouth 3 (three) times a week.  . betamethasone dipropionate (DIPROLENE) 0.05 % cream Apply 1 application topically daily as needed (eczema).   . Black Pepper-Turmeric (TURMERIC COMPLEX/BLACK PEPPER PO) Take 1,500 mg by mouth daily.  Marland Kitchen buPROPion (WELLBUTRIN XL) 150 MG 24 hr tablet Take 150 mg by mouth daily.  . Cholecalciferol (VITAMIN D3) 125 MCG (5000 UT) CAPS Take 2 caps (10,000 units) daily  . finasteride (PROSCAR) 5 MG tablet TAKE 1 TABLET BY MOUTH ONCE DAILY  . glucose blood (FREESTYLE TEST STRIPS) test strip Check blood sugar 3 to 4 times daily for medication regulation.  . insulin NPH-regular Human (NOVOLIN 70/30) (70-30) 100 UNIT/ML injection Take 20-30 units 2 x day or as directed (Patient taking differently: Inject 12-22 Units into the skin See admin instructions. Injects 22 units in the AM and 12 units in the PM.)  . latanoprost (XALATAN) 0.005 %  ophthalmic solution Place 1 drop into both eyes at bedtime.   . magnesium gluconate (MAGONATE) 500 MG tablet Take 500 mg by mouth 2 (two) times daily.  Marland Kitchen MAGNESIUM-OXIDE 400 (241.3 Mg) MG tablet Take 400 mg by mouth 2 (two) times daily.   . mycophenolate (MYFORTIC) 360 MG TBEC EC tablet TAKE 1 TABLET BY MOUTH TWICE DAILY (Patient taking differently: Take 360 mg by mouth 2 (two) times daily. )  . Probiotic Product (PROBIOTIC DAILY PO) Take 1 capsule by mouth daily.  . tacrolimus (PROGRAF) 0.5 MG capsule Take 0.5 mg by mouth 2 (two) times daily.  . tacrolimus (PROGRAF) 1 MG capsule Take 1 mg by mouth 2 (two) times daily.  Marland Kitchen warfarin (COUMADIN) 5 MG tablet TAKE 1 TO 2 TABLETS BY MOUTH ONCE DAILY AS DIRECTED (Patient taking differently: Take 7.5 mg twice a week and 5 mg all other days)  . Zinc 50 MG CAPS  Take 50 mg by mouth daily.  Marland Kitchen HYDROcodone-acetaminophen (NORCO/VICODIN) 5-325 MG tablet Take 1-2 tablets by mouth every 4 (four) hours as needed (pain).   No current facility-administered medications on file prior to visit.      Allergies:  Allergies  Allergen Reactions  . Losartan Anaphylaxis  . Crestor [Rosuvastatin]     Elevated LFT's  . Lorazepam     Pt is unsure of reaction   . Morphine And Related Other (See Comments)    Has no effect on pt      Medical History:  Past Medical History:  Diagnosis Date  . Adenomatous colon polyp   . Allergy   . BPH (benign prostatic hyperplasia)   . CHF (congestive heart failure) ([place])   . 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 Diabetes mellitus type 2, controlled ([place])   . Glaucoma   . Gout   . 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 MI (myocardial infarction) ([place]) 09/26/2011  . NSTEMI (non-ST elevated myocardial infarction) ([place]) 11/26/2013   2013   . PAF (paroxysmal atrial fibrillation) ([place])    s/p DC-CV in 6/13. Off coumadin due to ureteral bleed   Family history- Reviewed and unchanged Social history- Reviewed and unchanged   Review of Systems:  Review of Systems  Constitutional: Negative for chills, diaphoresis, fever, malaise/fatigue and weight loss.  HENT: Negative for congestion, ear discharge, ear pain, hearing loss, nosebleeds, sinus pain, sore throat and tinnitus.        Ear fullness and nasal drainage.  Eyes: Negative for blurred vision, double vision, photophobia, pain, discharge and redness.  Respiratory: Negative for cough, hemoptysis, sputum production, shortness of breath, wheezing and stridor.   Cardiovascular: Negative for chest pain, palpitations, orthopnea, claudication, leg swelling and PND.  Gastrointestinal: Negative for abdominal pain, blood in stool, constipation, diarrhea, heartburn, melena, nausea and vomiting.  Genitourinary: Negative for  dysuria, flank pain, frequency, hematuria and urgency.  Musculoskeletal: Negative for back pain, falls, joint pain, myalgias and neck pain.  Skin: Negative for itching and rash.  Neurological: Negative for dizziness, tingling, tremors, sensory change, speech change, focal weakness, seizures, loss of consciousness, weakness and headaches.  Endo/Heme/Allergies: Negative for environmental allergies and polydipsia. Bruises/bleeds easily.  Psychiatric/Behavioral: Negative for depression, hallucinations, memory loss, substance abuse and suicidal ideas. The patient is not nervous/anxious and does not have insomnia.       Physical Exam: BP 122/62   Pulse 80   Temp (!) 97.5 F (36.4 C)  Ht 5\' 8"  (1.727 m)   Wt 185 lb (83.9 kg)   SpO2 97%   BMI 28.13 kg/m  Wt Readings from Last 3 Encounters:  05/22/18 185 lb (83.9 kg)  05/04/18 180 lb (81.6 kg)  05/01/18 188 lb 7.9 oz (85.5 kg)   General Appearance: Pale, in no apparent distress. Eyes: PERRLA, EOMs, conjunctiva no swelling or erythema Sinuses: No Frontal/maxillary tenderness ENT/Mouth: Ext aud canals clear, TMs without erythema, bulging. No erythema, swelling, or exudate on post pharynx.  Tonsils not swollen or erythematous. Hearing normal.  Neck: Supple, thyroid normal.  Respiratory: Respiratory effort normal, BS equal bilaterally without rales, rhonchi, wheezing or stridor.  Cardio: RRR with no MRGs. Brisk peripheral pulses without edema.  Abdomen: Soft, + BS.  Non tender, no guarding, rebound, hernias, masses. Lymphatics: Non tender without lymphadenopathy.  Musculoskeletal: Full ROM, 5/5 strength, Normal gait Skin: Warm, dry without rashes, lesions, ecchymosis. Left anterior neck incision.  Well approximated, free of any erythema or exudate.  Resolving hematomas from IV sites. Neuro: Cranial nerves intact. No cerebellar symptoms.  Psych: Awake and oriented X 3, normal affect, Insight and Judgment appropriate.    Garnet Sierras,  NP 1:19 PM [facility]

## 2018-05-22 NOTE — Patient Instructions (Addendum)
Choose an antihistamine to help dry up nasal drainage.  Zyrtec / Cetirizine    Take 10mg  by mouth May cause drowsiness, take nightly Be sure to drink plenty of water If this is not effective, try Xyzal OR Allegra  OR  Xyzal / Levocetirazine  Take 5mg  by mouth May cause drowsiness, take nightly Be sure to drink plenty of water If this is not effective try Allegra OR Zyrtec  OR  Allegra / fexofenadine Take 180mg  by mouth If this is not effective try Zyrtec OR Xyzal   *If you battle with chronic allergies you may need to change the antihistamine you currently use to find most effective.   Please contact the office if this is not helping with your sleep and we can try an alternative.  We will contact you with your INR level tomorrow.

## 2018-05-23 ENCOUNTER — Encounter: Payer: Self-pay | Admitting: Adult Health Nurse Practitioner

## 2018-05-23 DIAGNOSIS — Z981 Arthrodesis status: Secondary | ICD-10-CM | POA: Insufficient documentation

## 2018-05-23 LAB — PROTIME-INR
INR: 1.6 — ABNORMAL HIGH
Prothrombin Time: 15.8 s — ABNORMAL HIGH (ref 9.0–11.5)

## 2018-05-27 ENCOUNTER — Other Ambulatory Visit: Payer: Self-pay | Admitting: Internal Medicine

## 2018-05-28 ENCOUNTER — Other Ambulatory Visit: Payer: Self-pay

## 2018-05-28 DIAGNOSIS — Z1211 Encounter for screening for malignant neoplasm of colon: Secondary | ICD-10-CM

## 2018-05-28 LAB — POC HEMOCCULT BLD/STL (HOME/3-CARD/SCREEN)
Card #2 Fecal Occult Blod, POC: NEGATIVE
Card #3 Fecal Occult Blood, POC: NEGATIVE
FECAL OCCULT BLD: NEGATIVE

## 2018-05-29 ENCOUNTER — Other Ambulatory Visit: Payer: Medicare Other

## 2018-05-29 DIAGNOSIS — Z7901 Long term (current) use of anticoagulants: Secondary | ICD-10-CM

## 2018-05-29 DIAGNOSIS — I480 Paroxysmal atrial fibrillation: Secondary | ICD-10-CM

## 2018-05-30 LAB — PROTIME-INR
INR: 1.5 — ABNORMAL HIGH
Prothrombin Time: 15 s — ABNORMAL HIGH (ref 9.0–11.5)

## 2018-06-18 ENCOUNTER — Other Ambulatory Visit: Payer: Self-pay | Admitting: Adult Health

## 2018-06-18 ENCOUNTER — Ambulatory Visit (INDEPENDENT_AMBULATORY_CARE_PROVIDER_SITE_OTHER): Payer: Medicare Other | Admitting: *Deleted

## 2018-06-18 ENCOUNTER — Other Ambulatory Visit: Payer: Self-pay

## 2018-06-18 VITALS — BP 138/78 | HR 48 | Temp 97.3°F | Resp 18 | Ht 68.0 in | Wt 188.2 lb

## 2018-06-18 DIAGNOSIS — Z7901 Long term (current) use of anticoagulants: Secondary | ICD-10-CM | POA: Diagnosis not present

## 2018-06-18 NOTE — Progress Notes (Signed)
Patient is here for a NV to recheck his PT/INR.  He states he takes Warfarin 5 mg tablet 3 days a week and 7.5 mg x 4 days a week.

## 2018-06-19 LAB — PROTIME-INR

## 2018-06-23 LAB — PROTIME-INR
INR: 2.9 — ABNORMAL HIGH
Prothrombin Time: 27.7 s — ABNORMAL HIGH (ref 9.0–11.5)

## 2018-06-24 ENCOUNTER — Other Ambulatory Visit: Payer: Self-pay | Admitting: Internal Medicine

## 2018-06-27 ENCOUNTER — Other Ambulatory Visit: Payer: Self-pay | Admitting: Adult Health

## 2018-06-27 DIAGNOSIS — I482 Chronic atrial fibrillation, unspecified: Secondary | ICD-10-CM

## 2018-06-27 MED ORDER — WARFARIN SODIUM 5 MG PO TABS: ORAL_TABLET | ORAL | 0 refills | Status: DC

## 2018-06-30 ENCOUNTER — Ambulatory Visit: Payer: Self-pay | Admitting: Adult Health

## 2018-06-30 ENCOUNTER — Ambulatory Visit: Payer: Medicare Other | Admitting: Adult Health

## 2018-07-20 ENCOUNTER — Encounter: Payer: Self-pay | Admitting: Adult Health Nurse Practitioner

## 2018-07-27 ENCOUNTER — Other Ambulatory Visit: Payer: Self-pay | Admitting: Internal Medicine

## 2018-07-27 DIAGNOSIS — E559 Vitamin D deficiency, unspecified: Secondary | ICD-10-CM

## 2018-07-27 DIAGNOSIS — Z794 Long term (current) use of insulin: Secondary | ICD-10-CM

## 2018-07-27 DIAGNOSIS — Z79899 Other long term (current) drug therapy: Secondary | ICD-10-CM

## 2018-07-27 DIAGNOSIS — N182 Chronic kidney disease, stage 2 (mild): Secondary | ICD-10-CM

## 2018-07-27 DIAGNOSIS — Z7901 Long term (current) use of anticoagulants: Secondary | ICD-10-CM

## 2018-07-27 DIAGNOSIS — I10 Essential (primary) hypertension: Secondary | ICD-10-CM

## 2018-07-27 DIAGNOSIS — E782 Mixed hyperlipidemia: Secondary | ICD-10-CM

## 2018-07-27 DIAGNOSIS — E1122 Type 2 diabetes mellitus with diabetic chronic kidney disease: Secondary | ICD-10-CM

## 2018-07-27 DIAGNOSIS — M1A3790 Chronic gout due to renal impairment, unspecified ankle and foot, without tophus (tophi): Secondary | ICD-10-CM

## 2018-07-28 ENCOUNTER — Other Ambulatory Visit: Payer: Medicare Other

## 2018-07-28 ENCOUNTER — Other Ambulatory Visit: Payer: Self-pay

## 2018-07-28 DIAGNOSIS — M1A3790 Chronic gout due to renal impairment, unspecified ankle and foot, without tophus (tophi): Secondary | ICD-10-CM

## 2018-07-28 DIAGNOSIS — E1122 Type 2 diabetes mellitus with diabetic chronic kidney disease: Secondary | ICD-10-CM

## 2018-07-28 DIAGNOSIS — E559 Vitamin D deficiency, unspecified: Secondary | ICD-10-CM

## 2018-07-28 DIAGNOSIS — E782 Mixed hyperlipidemia: Secondary | ICD-10-CM

## 2018-07-28 DIAGNOSIS — N182 Chronic kidney disease, stage 2 (mild): Secondary | ICD-10-CM

## 2018-07-28 DIAGNOSIS — I10 Essential (primary) hypertension: Secondary | ICD-10-CM

## 2018-07-28 DIAGNOSIS — Z7901 Long term (current) use of anticoagulants: Secondary | ICD-10-CM

## 2018-07-28 DIAGNOSIS — Z794 Long term (current) use of insulin: Secondary | ICD-10-CM

## 2018-07-28 DIAGNOSIS — Z79899 Other long term (current) drug therapy: Secondary | ICD-10-CM

## 2018-07-29 ENCOUNTER — Encounter: Payer: Self-pay | Admitting: Internal Medicine

## 2018-07-29 LAB — MAGNESIUM: Magnesium: 1.8 mg/dL (ref 1.5–2.5)

## 2018-07-29 LAB — CBC WITH DIFFERENTIAL/PLATELET
Absolute Monocytes: 486 cells/uL (ref 200–950)
Basophils Absolute: 50 cells/uL (ref 0–200)
Basophils Relative: 1.1 %
Eosinophils Absolute: 99 cells/uL (ref 15–500)
Eosinophils Relative: 2.2 %
HCT: 44.6 % (ref 38.5–50.0)
Hemoglobin: 15.2 g/dL (ref 13.2–17.1)
Lymphs Abs: 1274 cells/uL (ref 850–3900)
MCH: 31.5 pg (ref 27.0–33.0)
MCHC: 34.1 g/dL (ref 32.0–36.0)
MCV: 92.3 fL (ref 80.0–100.0)
MPV: 9.9 fL (ref 7.5–12.5)
Monocytes Relative: 10.8 %
Neutro Abs: 2592 cells/uL (ref 1500–7800)
Neutrophils Relative %: 57.6 %
Platelets: 142 10*3/uL (ref 140–400)
RBC: 4.83 10*6/uL (ref 4.20–5.80)
RDW: 12.6 % (ref 11.0–15.0)
Total Lymphocyte: 28.3 %
WBC: 4.5 10*3/uL (ref 3.8–10.8)

## 2018-07-29 LAB — HEMOGLOBIN A1C
Hgb A1c MFr Bld: 5.6 % of total Hgb (ref <5.7)
Mean Plasma Glucose: 114 (calc)
eAG (mmol/L): 6.3 (calc)

## 2018-07-29 LAB — COMPLETE METABOLIC PANEL WITH GFR
AG Ratio: 1.8 (calc) (ref 1.0–2.5)
ALT: 18 U/L (ref 9–46)
AST: 23 U/L (ref 10–35)
Albumin: 4.3 g/dL (ref 3.6–5.1)
Alkaline phosphatase (APISO): 97 U/L (ref 35–144)
BUN: 22 mg/dL (ref 7–25)
CO2: 28 mmol/L (ref 20–32)
Calcium: 9.9 mg/dL (ref 8.6–10.3)
Chloride: 104 mmol/L (ref 98–110)
Creat: 1.04 mg/dL (ref 0.70–1.18)
GFR, Est African American: 84 mL/min/{1.73_m2} (ref >=60)
GFR, Est Non African American: 72 mL/min/{1.73_m2} (ref >=60)
Globulin: 2.4 g/dL (calc) (ref 1.9–3.7)
Glucose, Bld: 81 mg/dL (ref 65–99)
Potassium: 4.2 mmol/L (ref 3.5–5.3)
Sodium: 140 mmol/L (ref 135–146)
Total Bilirubin: 0.9 mg/dL (ref 0.2–1.2)
Total Protein: 6.7 g/dL (ref 6.1–8.1)

## 2018-07-29 LAB — LIPID PANEL
Cholesterol: 170 mg/dL (ref <200)
HDL: 67 mg/dL (ref >=40)
LDL Cholesterol (Calc): 87 mg/dL (calc)
Non-HDL Cholesterol (Calc): 103 mg/dL (calc) (ref <130)
Total CHOL/HDL Ratio: 2.5 (calc) (ref <5.0)
Triglycerides: 74 mg/dL (ref <150)

## 2018-07-29 LAB — INSULIN, RANDOM: Insulin: 23.7 u[IU]/mL — ABNORMAL HIGH

## 2018-07-29 LAB — TSH: TSH: 2.99 mIU/L (ref 0.40–4.50)

## 2018-07-29 LAB — VITAMIN D 25 HYDROXY (VIT D DEFICIENCY, FRACTURES): Vit D, 25-Hydroxy: 110 ng/mL — ABNORMAL HIGH (ref 30–100)

## 2018-07-29 LAB — URIC ACID: Uric Acid, Serum: 4.1 mg/dL (ref 4.0–8.0)

## 2018-07-29 LAB — PROTIME-INR
INR: 2.6 — ABNORMAL HIGH
Prothrombin Time: 25.1 s — ABNORMAL HIGH (ref 9.0–11.5)

## 2018-07-29 MED ORDER — ALLOPURINOL 300 MG PO TABS: 300.0000 mg | ORAL_TABLET | Freq: Every day | ORAL | Status: DC

## 2018-07-29 NOTE — Progress Notes (Signed)
THIS ENCOUNTER IS A VIRTUAL VISIT DUE TO COVID-19 - PATIENT WAS NOT SEEN IN THE OFFICE.  PATIENT HAS CONSENTED TO VIRTUAL VISIT / TELEMEDICINE VISIT  This provider placed a call to [employer] using telephone, his appointment was changed to a virtual office visit to reduce the risk of exposure to the COVID-19 virus and to help [employer] remain healthy and safe. The virtual visit will also provide continuity of care. He verbalizes understanding.   Virtual Visit via telephone Note  I connected with the patient  on 08/02/18  by telephone.  I verified that I am speaking with the correct person using two identifiers.        I discussed the limitations of evaluation and management by telemedicine and the availability of in person appointments. The patient expressed understanding and agreed to proceed.  History of Present Illness:      This very nice 73 y.o. MWM presents for 3 month follow up with HTN, ASCAD s/p CABG, pAfib, HLD, T2_IDDM and Vitamin D Deficiency. On 04 May 2008, patient underwent a C5-6 ACDF for HNP by Dr Rita Ohara. Gout is controlled on Allopurinol. Likewise his Depression is in remission.       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 Patient is on Prograf/Myfortic & is followed by Dr Erling Cruz.       Patient is treated for HTN (1998)  & BP has been controlled at home. Todays BP: (!) 161/78. After this Renal Transplant in June 2013, he had ACS and had PCA/Stent placement.  6 months later in Dec 2013, he had CABG and had po pAfib & has been on coumadin since CHA2DS2-VASc=5) . Patient has had no complaints of any cardiac type chest pain, palpitations, dyspnea / orthopnea / PND, dizziness, claudication, or dependent edema.      Hyperlipidemia is controlled with diet & Atorvastatin. Patient denies myalgias or other med SEs. Last Lipids were at goal: Lab Results  Component Value Date   CHOL  170 07/28/2018   HDL 67 07/28/2018   LDLCALC 87 07/28/2018   TRIG 74 07/28/2018   CHOLHDL 2.5 07/28/2018       Also, the patient has history of Insulin Requiring  T2_DM w/CKD2 (transplanted kidney)  and has had no symptoms of reactive hypoglycemia, diabetic polys, paresthesias or visual blurring.  Last A1c was at goal: Lab Results  Component Value Date   HGBA1C 5.6 07/28/2018      Further, the patient also has history of Vitamin D ("24" / 2014)  and supplements vitamin D without any suspected side-effects. Last vitamin D was sl elevated and dose is tapered:  Lab Results  Component Value Date   VD25OH 110 (H) 07/28/2018   Current Outpatient Medications on File Prior to Visit  Medication Sig   Ascorbic Acid (VITAMIN C) 1000 MG tablet Take 1,000 mg by mouth daily.   aspirin EC 81 MG tablet Take 81 mg by mouth daily.   atorvastatin (LIPITOR) 80 MG tablet Take 40 mg by mouth 3 (three) times a week.   betamethasone dipropionate (DIPROLENE) 0.05 % cream Apply 1 application topically daily as needed (eczema).    Black Pepper-Turmeric (TURMERIC COMPLEX/BLACK PEPPER PO) Take 1,500 mg by mouth daily.   buPROPion (WELLBUTRIN XL) 300 MG 24 hr tablet TAKE ONE TABLET BY MOUTH EVERY MORNING   finasteride (PROSCAR) 5 MG tablet TAKE ONE TABLET BY MOUTH DAILY  glucose blood (FREESTYLE TEST STRIPS) test strip Check blood sugar 3 to 4 times daily for medication regulation.   insulin NPH-regular Human (NOVOLIN 70/30) (70-30) 100 UNIT/ML injection Take 20-30 units 2 x day or as directed (Patient taking differently: Inject 12-22 Units into the skin See admin instructions. Injects 22 units in the AM and 12 units in the PM.)   latanoprost (XALATAN) 0.005 % ophthalmic solution Place 1 drop into both eyes at bedtime.    MAGNESIUM-OXIDE 400 (241.3 Mg) MG tablet Take 400 mg by mouth 2 (two) times daily.    mycophenolate (MYFORTIC) 360 MG TBEC EC tablet TAKE 1 TABLET BY MOUTH TWICE DAILY (Patient taking  differently: Take 360 mg by mouth 2 (two) times daily. )   Probiotic Product (PROBIOTIC DAILY PO) Take 1 capsule by mouth daily.   tacrolimus (PROGRAF) 0.5 MG capsule Take 0.5 mg by mouth 2 (two) times daily.   tacrolimus (PROGRAF) 1 MG capsule Take 1 mg by mouth 2 (two) times daily.   VITAMIN D PO Take 5,000 Units by mouth. Takes two 5000 unit capsules on MWF and 1 capsule 4 days a week.   warfarin (COUMADIN) 5 MG tablet Take 1.5 tabs (7.5 mg) 4 days a week and 1 tab (5 mg) 3 days a week (MWF).   Zinc 50 MG CAPS Take 50 mg by mouth daily.   No current facility-administered medications on file prior to visit.    Allergies  Allergen Reactions   Losartan Anaphylaxis   Crestor [Rosuvastatin]     Elevated LFT's   Lorazepam     Pt is unsure of reaction    Morphine And Related Other (See Comments)    Has no effect on pt    PMHx:   Past Medical History:  Diagnosis Date   Adenomatous colon polyp    Allergy    BPH (benign prostatic hyperplasia)    CHF (congestive heart failure) (HCC)    Chronic kidney disease    due IgA nephropathy - s/p kidnet transplant 09/25/11   Diabetes mellitus type 2, controlled ([place])    Glaucoma    Gout    Histoplasmosis    on itraconazole for prophylaxis   MI (myocardial infarction) ([place]) 09/26/2011   NSTEMI (non-ST elevated myocardial infarction) ([place]) 11/26/2013   2013    PAF (paroxysmal atrial fibrillation) ([place])    s/p DC-CV in 6/13. Off coumadin due to ureteral bleed   Immunization History  Administered Date(s) Administered   DT 06/05/2015   Hepatitis B 04/08/2009   Influenza, High Dose Seasonal PF 12/27/2013, 01/17/2015, 01/11/2016, 01/02/2018   Influenza-Unspecified 12/25/2016   Pneumococcal Conjugate-13 10/25/2013   Pneumococcal Polysaccharide-23 06/05/2015   Pneumococcal-Unspecified 04/08/2008   Tdap 04/08/2008   Past Surgical History:  Procedure Laterality Date   ANTERIOR CERVICAL DECOMP/DISCECTOMY FUSION N/A  05/04/2018   Procedure: Cervical Five-Six Anterior cervical decompression/discectomy/fusion;  Surgeon: Jovita Gamma, MD;  Location: [place];  Service: Neurosurgery;  Laterality: N/A;  Cervical Five-Six Anterior cervical decompression/discectomy/fusion   AV FISTULA PLACEMENT  2011   Left forearm   COLONOSCOPY     CORONARY ANGIOPLASTY WITH STENT PLACEMENT     CORONARY ARTERY BYPASS GRAFT  2013   KIDNEY TRANSPLANT  09/25/2011   PERITONEAL CATHETER INSERTION  2011   FHx:    Reviewed / unchanged SHx:    Reviewed / unchanged   Systems Review:  Constitutional: Denies fever, chills, wt changes, headaches, insomnia, fatigue, night sweats, change in appetite. Eyes: Denies redness, blurred vision, diplopia, discharge,  itchy, watery eyes.  ENT: Denies discharge, congestion, post nasal drip, epistaxis, sore throat, earache, hearing loss, dental pain, tinnitus, vertigo, sinus pain, snoring.  CV: Denies chest pain, palpitations, irregular heartbeat, syncope, dyspnea, diaphoresis, orthopnea, PND, claudication or edema. Respiratory: denies cough, dyspnea, DOE, pleurisy, hoarseness, laryngitis, wheezing.  Gastrointestinal: Denies dysphagia, odynophagia, heartburn, reflux, water brash, abdominal pain or cramps, nausea, vomiting, bloating, diarrhea, constipation, hematemesis, melena, hematochezia  or hemorrhoids. Genitourinary: Denies dysuria, frequency, urgency, nocturia, hesitancy, discharge, hematuria or flank pain. Musculoskeletal: Denies arthralgias, myalgias, stiffness, jt. swelling, pain, limping or strain/sprain.  Skin: Denies pruritus, rash, hives, warts, acne, eczema or change in skin lesion(s). Neuro: No weakness, tremor, incoordination, spasms, paresthesia or pain. Psychiatric: Denies confusion, memory loss or sensory loss. Endo: Denies change in weight, skin or hair change.  Heme/Lymph: No excessive bleeding, bruising or enlarged lymph nodes.  Physical Exam  BP (!) 161/78    Temp (!)  97.4 F (36.3 C)    Wt 186 lb 3.2 oz (84.5 kg)    BMI 28.31 kg/m   General : Well sounding patient in no apparent distress HEENT: no hoarseness, no cough for duration of visit Lungs: speaks in complete sentences, no audible wheezing, no apparent distress Neurological: alert, oriented x 3 Psychiatric: pleasant, judgement appropriate   Assessment and Plan:  1. Essential hypertension  - Continue medication, monitor blood pressure at home.  - Continue DASH diet.  Reminder to go to the ER if any CP,  SOB, nausea, dizziness, severe HA, changes vision/speech.  2. Hyperlipidemia, mixed  - Continue diet/meds, exercise,& lifestyle modifications.  - Continue monitor periodic cholesterol/liver & renal functions    3. Type 2 diabetes mellitus with stage 2 chronic kidney disease, with long-term current use of insulin (HCC)  - Continue diet, exercise  - Lifestyle modifications.  - Monitor appropriate labs.  4. Vitamin D deficiency  - Continue supplementation.  5. Renal Transplant, s/p 09/2011  6. 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 Chronic atrial fibrillation  8. 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 Anticoagulant long-term use  10. Medication management       Discussed  regular exercise, BP monitoring, weight control to achieve/maintain BMI less than 25 and discussed med and SE's. Recommended labs to be scheduled. I discussed the assessment and treatment plan with the patient. The patient was provided an opportunity to ask questions and all were answered. The patient agreed with the plan and demonstrated an understanding of the instructions.       I provided 26 minutes of non-face-to-face time during this encounter and over 35 minutes of exam, counseling, chart review and  complex critical decision making was performed  Kirtland Bouchard, MD

## 2018-07-29 NOTE — Patient Instructions (Signed)
Coronavirus (COVID-19) Are you at risk?  Are you at risk for the Coronavirus (COVID-19)?  To be considered HIGH RISK for Coronavirus (COVID-19), you have to meet the following criteria:  . Traveled to [place], [place], [place], [place] or [place]; or in the [place] to [place], [place], [place]  . or [place]; and have fever, cough, and shortness of breath within the last 2 weeks of travel OR . Been in close contact with a person diagnosed with COVID-19 within the last 2 weeks and have  . fever, cough,and shortness of breath .  . IF YOU DO NOT MEET THESE CRITERIA, YOU ARE CONSIDERED LOW RISK FOR COVID-19.  What to do if you are HIGH RISK for COVID-19?  . If you are having a medical emergency, call 911. . Seek medical care right away. Before you go to a doctor's office, urgent care or emergency department, .  call ahead and tell them about your recent travel, contact with someone diagnosed with COVID-19  .  and your symptoms.  . You should receive instructions from your physician's office regarding next steps of care.  . When you arrive at healthcare provider, tell the healthcare staff immediately you have returned from  . visiting [place], [place], [place], [place] or [place]; or traveled in the [place] to [place], [place],  . [place] or [place] in the last two weeks or you have been in close contact with a person diagnosed with  . COVID-19 in the last 2 weeks.   . Tell the health care staff about your symptoms: fever, cough and shortness of breath. . After you have been seen by a medical provider, you will be either: o Tested for (COVID-19) and discharged home on quarantine except to seek medical care if  o symptoms worsen, and asked to  - Stay home and avoid contact with others until you get your results (4-5 days)  - Avoid travel on public transportation if possible (such as bus, train, or airplane) or o Sent to the Emergency Department by EMS for evaluation,  COVID-19 testing  and  o possible admission depending on your condition and test results.  What to do if you are LOW RISK for COVID-19?  Reduce your risk of any infection by using the same precautions used for avoiding the common cold or flu:  . Wash your hands often with soap and warm water for at least 20 seconds.  If soap and water are not readily available,  . use an alcohol-based hand sanitizer with at least 60% alcohol.  . If coughing or sneezing, cover your mouth and nose by coughing or sneezing into the elbow areas of your shirt or coat, .  into a tissue or into your sleeve (not your hands). . Avoid shaking hands with others and consider head nods or verbal greetings only. . Avoid touching your eyes, nose, or mouth with unwashed hands.  . Avoid close contact with people who are sick. . Avoid places or events with large numbers of people in one location, like concerts or sporting events. . Carefully consider travel plans you have or are making. . If you are planning any travel outside or inside the [place], visit the CDC's Travelers' Health webpage for the latest health notices. . If you have some symptoms but not all symptoms, continue to monitor at home and seek medical attention  . if your symptoms worsen. . If you are having a medical emergency, call 911.   ++++++++++++++++++++++++++++++++   Recommend Adult Low Dose Aspirin or  coated  Aspirin 81 mg daily  To reduce risk of Colon Cancer 20 %,  Skin Cancer 26 % ,  Melanoma 46%  and  Pancreatic cancer 60% ++++++++++++++++++++++++++++++++ Vitamin D goal  is between 70-100.  Please make sure that you are taking your Vitamin D as directed.  It is very important as a natural anti-inflammatory  helping hair, skin, and nails, as well as reducing stroke and heart attack risk.  It helps your bones and helps with mood. It also decreases numerous cancer risks so please take it as directed.  Low Vit D is associated with a 200-300% higher  risk for CANCER  and 200-300% higher risk for HEART   ATTACK  &  STROKE.   ...................................... It is also associated with higher death rate at younger ages,  autoimmune diseases like Rheumatoid arthritis, Lupus, Multiple Sclerosis.    Also many other serious conditions, like depression, Alzheimer's Dementia, infertility, muscle aches, fatigue, fibromyalgia - just to name a few. ++++++++++++++++++++ Recommend the book "The END of DIETING" by Dr Joel Fuhrman  & the book "The END of DIABETES " by Dr Joel Fuhrman At [employer] - get book & Audio CD's    Being diabetic has a  300% increased risk for heart attack, stroke, cancer, and alzheimer- type vascular dementia. It is very important that you work harder with diet by avoiding all foods that are white. Avoid white rice (brown & wild rice is OK), white potatoes (sweetpotatoes in moderation is OK), White bread or wheat bread or anything made out of white flour like bagels, donuts, rolls, buns, biscuits, cakes, pastries, cookies, pizza crust, and pasta (made from white flour & egg whites) - vegetarian pasta or spinach or wheat pasta is OK. Multigrain breads like Arnold's or Pepperidge [employer], or multigrain sandwich thins or flatbreads.  Diet, exercise and weight loss can reverse and cure diabetes in the early stages.  Diet, exercise and weight loss is very important in the control and prevention of complications of diabetes which affects every system in your body, ie. Brain - dementia/stroke, eyes - glaucoma/blindness, heart - heart attack/heart failure, kidneys - dialysis, stomach - gastric paralysis, intestines - malabsorption, nerves - severe painful neuritis, circulation - gangrene & loss of a leg(s), and finally cancer and Alzheimers.    I recommend avoid fried & greasy foods,  sweets/candy, white rice (brown or wild rice or Quinoa is OK), white potatoes (sweet potatoes are OK) - anything made from white flour - bagels, doughnuts,  rolls, buns, biscuits,white and wheat breads, pizza crust and traditional pasta made of white flour & egg white(vegetarian pasta or spinach or wheat pasta is OK).  Multi-grain bread is OK - like multi-grain flat bread or sandwich thins. Avoid alcohol in excess. Exercise is also important.    Eat all the vegetables you want - avoid meat, especially red meat and dairy - especially cheese.  Cheese is the most concentrated form of trans-fats which is the worst thing to clog up our arteries. Veggie cheese is OK which can be found in the fresh produce section at [employer] or [employer] or [employer]  +++++++++++++++++++++ DASH Eating Plan  DASH stands for "Dietary Approaches to Stop Hypertension."   The DASH eating plan is a healthy eating plan that has been shown to reduce high blood pressure (hypertension). Additional health benefits may include reducing the risk of type 2 diabetes mellitus, heart disease, and stroke. The DASH eating plan may   also help with weight loss. WHAT DO I NEED TO KNOW ABOUT THE DASH EATING PLAN? For the DASH eating plan, you will follow these general guidelines:  Choose foods with a percent daily value for sodium of less than 5% (as listed on the food label).  Use salt-free seasonings or herbs instead of table salt or sea salt.  Check with your health care provider or pharmacist before using salt substitutes.  Eat lower-sodium products, often labeled as "lower sodium" or "no salt added."  Eat fresh foods.  Eat more vegetables, fruits, and low-fat dairy products.  Choose whole grains. Look for the word "whole" as the first word in the ingredient list.  Choose fish   Limit sweets, desserts, sugars, and sugary drinks.  Choose heart-healthy fats.  Eat veggie cheese   Eat more home-cooked food and less restaurant, buffet, and fast food.  Limit fried foods.  Cook foods using methods other than frying.  Limit canned vegetables. If you do use them, rinse them  well to decrease the sodium.  When eating at a restaurant, ask that your food be prepared with less salt, or no salt if possible.                      WHAT FOODS CAN I EAT? Read Dr Joel Fuhrman's books on The End of Dieting & The End of Diabetes  Grains Whole grain or whole wheat bread. Brown rice. Whole grain or whole wheat pasta. Quinoa, bulgur, and whole grain cereals. Low-sodium cereals. Corn or whole wheat flour tortillas. Whole grain cornbread. Whole grain crackers. Low-sodium crackers.  Vegetables Fresh or frozen vegetables (raw, steamed, roasted, or grilled). Low-sodium or reduced-sodium tomato and vegetable juices. Low-sodium or reduced-sodium tomato sauce and paste. Low-sodium or reduced-sodium canned vegetables.   Fruits All fresh, canned (in natural juice), or frozen fruits.  Protein Products  All fish and seafood.  Dried beans, peas, or lentils. Unsalted nuts and seeds. Unsalted canned beans.  Dairy Low-fat dairy products, such as skim or 1% milk, 2% or reduced-fat cheeses, low-fat ricotta or cottage cheese, or plain low-fat yogurt. Low-sodium or reduced-sodium cheeses.  Fats and Oils Tub margarines without trans fats. Light or reduced-fat mayonnaise and salad dressings (reduced sodium). Avocado. Safflower, olive, or canola oils. Natural peanut or almond butter.  Other Unsalted popcorn and pretzels. The items listed above may not be a complete list of recommended foods or beverages. Contact your dietitian for more options.  +++++++++++++++  WHAT FOODS ARE NOT RECOMMENDED? Grains/ White flour or wheat flour White bread. White pasta. White rice. Refined cornbread. Bagels and croissants. Crackers that contain trans fat.  Vegetables  Creamed or fried vegetables. Vegetables in a . Regular canned vegetables. Regular canned tomato sauce and paste. Regular tomato and vegetable juices.  Fruits Dried fruits. Canned fruit in light or heavy syrup. Fruit juice.  Meat and  Other Protein Products Meat in general - RED meat & White meat.  Fatty cuts of meat. Ribs, chicken wings, all processed meats as bacon, sausage, bologna, salami, fatback, hot dogs, bratwurst and packaged luncheon meats.  Dairy Whole or 2% milk, cream, half-and-half, and cream cheese. Whole-fat or sweetened yogurt. Full-fat cheeses or blue cheese. Non-dairy creamers and whipped toppings. Processed cheese, cheese spreads, or cheese curds.  Condiments Onion and garlic salt, seasoned salt, table salt, and sea salt. Canned and packaged gravies. Worcestershire sauce. Tartar sauce. Barbecue sauce. Teriyaki sauce. Soy sauce, including reduced sodium. Steak sauce. Fish sauce. Oyster sauce.   Cocktail sauce. Horseradish. Ketchup and mustard. Meat flavorings and tenderizers. Bouillon cubes. Hot sauce. Tabasco sauce. Marinades. Taco seasonings. Relishes.  Fats and Oils Butter, stick margarine, lard, shortening and bacon fat. Coconut, palm kernel, or palm oils. Regular salad dressings.  Pickles and olives. Salted popcorn and pretzels.  The items listed above may not be a complete list of foods and beverages to avoid.   Bleeding Precautions When on Anticoagulant Therapy  Anticoagulant therapy, also called blood thinner therapy, is medicine that helps to prevent and treat blood clots. The medicine works by stopping blood clots from forming or growing. Blood clots that form in your blood vessels can be dangerous. They can break loose and travel to the heart, lungs, or brain. This increases the risk of a heart attack, stroke, or blocked lung artery (pulmonary embolism). Anticoagulants also increase the risk of bleeding. Try to protect yourself from cuts and other injuries that can cause bleeding. It is important to take anticoagulants exactly as told by your health care provider. Why do I need to be on anticoagulant therapy? You may need this medicine if you are at risk of developing a blood clot. Conditions that  increase your risk of a blood clot include:  Being born with heart disease or a heart malformation (congenital heart disease).  Developing heart disease.  Having had surgery, such as valve replacement.  Having had a serious accident or other type of severe injury (trauma).  Having certain types of cancer.  Having certain diseases that can increase blood clotting.  Having a high risk of stroke or heart attack.  Having atrial fibrillation (AF). What are the common anticoagulant medicines? There are several types of anticoagulant medicines. The most common types are:  Medicines that you take by mouth (oral medicines), such as: ? Warfarin. ? Novel oral anticoagulants (NOACs), such as: ? Direct thrombin inhibitors (dabigatran). ? Factor Xa inhibitors (apixaban, edoxaban, and rivaroxaban).  Injections, such as: ? Unfractionated heparin. ? Low molecular weight heparin. These anticoagulants work in different ways to prevent blood clots. They also have different risks and side effects. What do I need to remember while on anticoagulant therapy? Taking anticoagulants  Take your medicine at the same time every day. If you forget to take your medicine, take it as soon as you remember. Do not double your dosage of medicine if you miss a whole day. Take your normal dose and call your health care provider.  Do not stop taking your medicine unless your health care provider approves. Stopping the medicine can increase your risk of developing a blood clot. Taking other medicines  Take over-the-counter and prescriptions medicines only as told by your health care provider.  Do not take over-the-counter NSAIDs, including aspirin and ibuprofen, while you are on anticoagulant therapy. These medicines increase your risk of dangerous bleeding.  Get approval from your health care provider before you start taking any new medicines, vitamins, or herbal products. Some of these could interfere with your  therapy. General instructions  Keep all follow-up visits as told by your health care provider. This is important.  If you are pregnant or trying to get pregnant, talk with a health care provider about anticoagulants. Some of these medicines are not safe to take during pregnancy.  Tell all health care providers, including your dentist, that you are on anticoagulant therapy. It is especially important to tell providers before you have any surgery, medical procedures, or dental work done. What precautions should I take?   Be very  careful when using knives, scissors, or other sharp objects.  Use an electric razor instead of a blade.  Do not use toothpicks.  Use a soft-bristled toothbrush. Brush your teeth gently.  Always wear shoes outdoors and wear slippers indoors.  Be careful when cutting your fingernails and toenails.  Place bath mats in the bathroom. If possible, install handrails as well.  Wear gloves while you do yard work.  Wear your seat belt.  Prevent falls by removing loose rugs and extension cords from areas where you walk. Use a cane or walker if you need it.  Avoid constipation by: ? Drinking enough fluid to keep your urine clear or pale yellow. ? Eating foods that are high in fiber, such as fresh fruits and vegetables, whole grains, and beans. ? Limiting foods that are high in fat and processed sugars, such as fried and sweet foods.  Do not play contact sports or participate in other activities that have a high risk for injury. What other precautions are important if on warfarin therapy? If you are taking a type of anticoagulant called warfarin, make sure you:  Work with a diet and nutrition specialist (dietitian) to make an eating plan. Do not make any sudden changes to your diet after you have started your eating plan.  Do not drink alcohol. It can interfere with your medicine and increase your risk of an injury that causes bleeding.  Get regular blood tests  as told by your health care provider. What are some questions to ask my health care provider?  Why do I need anticoagulant therapy?  What is the best anticoagulant therapy for my condition?  How long will I need anticoagulant therapy?  What are the side effects of anticoagulant therapy?  When should I take my medicine? What should I do if I forget to take it?  Will I need to have regular blood tests?  Do I need to change my diet? Are there foods or drinks that I should avoid?  What activities are safe for me?  What should I do if I want to get pregnant? Contact a health care provider if:  You miss a dose of medicine: ? And you are not sure what to do. ? For more than one day.  You have: ? Menstrual bleeding that is heavier than normal. ? Bloody or brown urine. ? Easy bruising. ? Black and tarry stool or bright red stool. ? Side effects from your medicine.  You feel weak or dizzy.  You become pregnant. Get help right away if:  You have bleeding that will not stop within 20 minutes from: ? The nose. ? The gums. ? A cut on the skin.  You have a severe headache or stomachache.  You vomit or cough up blood.  You fall or hit your head. Summary  Anticoagulant therapy, also called blood thinner therapy, is medicine that helps to prevent and treat blood clots.  Anticoagulants work in different ways to prevent blood clots. They also have different risks and side effects.  Talk with your health care provider about any precautions that you should take while on anticoagulant therapy.

## 2018-07-30 ENCOUNTER — Other Ambulatory Visit: Payer: Self-pay

## 2018-07-30 ENCOUNTER — Ambulatory Visit: Payer: Medicare Other | Admitting: Internal Medicine

## 2018-07-30 VITALS — BP 161/78 | Temp 97.4°F | Wt 186.2 lb

## 2018-07-30 DIAGNOSIS — I25708 Atherosclerosis of coronary artery bypass graft(s), unspecified, with other forms of angina pectoris: Secondary | ICD-10-CM | POA: Diagnosis not present

## 2018-07-30 DIAGNOSIS — E559 Vitamin D deficiency, unspecified: Secondary | ICD-10-CM

## 2018-07-30 DIAGNOSIS — Z7901 Long term (current) use of anticoagulants: Secondary | ICD-10-CM

## 2018-07-30 DIAGNOSIS — I2510 Atherosclerotic heart disease of native coronary artery without angina pectoris: Secondary | ICD-10-CM | POA: Diagnosis not present

## 2018-07-30 DIAGNOSIS — N182 Chronic kidney disease, stage 2 (mild): Secondary | ICD-10-CM

## 2018-07-30 DIAGNOSIS — M1A3790 Chronic gout due to renal impairment, unspecified ankle and foot, without tophus (tophi): Secondary | ICD-10-CM

## 2018-07-30 DIAGNOSIS — E1122 Type 2 diabetes mellitus with diabetic chronic kidney disease: Secondary | ICD-10-CM | POA: Diagnosis not present

## 2018-07-30 DIAGNOSIS — I10 Essential (primary) hypertension: Secondary | ICD-10-CM | POA: Diagnosis not present

## 2018-07-30 DIAGNOSIS — E782 Mixed hyperlipidemia: Secondary | ICD-10-CM | POA: Diagnosis not present

## 2018-07-30 DIAGNOSIS — Z940 Kidney transplant status: Secondary | ICD-10-CM

## 2018-07-30 DIAGNOSIS — Z79899 Other long term (current) drug therapy: Secondary | ICD-10-CM

## 2018-07-30 DIAGNOSIS — I482 Chronic atrial fibrillation, unspecified: Secondary | ICD-10-CM

## 2018-07-30 DIAGNOSIS — Z794 Long term (current) use of insulin: Secondary | ICD-10-CM

## 2018-09-01 ENCOUNTER — Other Ambulatory Visit: Payer: Self-pay

## 2018-09-01 ENCOUNTER — Ambulatory Visit (INDEPENDENT_AMBULATORY_CARE_PROVIDER_SITE_OTHER): Payer: Medicare Other | Admitting: Internal Medicine

## 2018-09-01 ENCOUNTER — Encounter: Payer: Self-pay | Admitting: Internal Medicine

## 2018-09-01 VITALS — BP 140/76 | HR 72 | Temp 97.0°F | Resp 16 | Ht 67.5 in | Wt 185.6 lb

## 2018-09-01 DIAGNOSIS — M7062 Trochanteric bursitis, left hip: Secondary | ICD-10-CM | POA: Diagnosis not present

## 2018-09-01 DIAGNOSIS — I10 Essential (primary) hypertension: Secondary | ICD-10-CM | POA: Diagnosis not present

## 2018-09-01 DIAGNOSIS — Z7901 Long term (current) use of anticoagulants: Secondary | ICD-10-CM | POA: Diagnosis not present

## 2018-09-01 DIAGNOSIS — I2510 Atherosclerotic heart disease of native coronary artery without angina pectoris: Secondary | ICD-10-CM

## 2018-09-01 DIAGNOSIS — Z79899 Other long term (current) drug therapy: Secondary | ICD-10-CM

## 2018-09-01 DIAGNOSIS — I480 Paroxysmal atrial fibrillation: Secondary | ICD-10-CM | POA: Diagnosis not present

## 2018-09-01 MED ORDER — DEXAMETHASONE SODIUM PHOSPHATE 10 MG/ML IJ SOLN: 10.0000 mg | Freq: Once | INTRAMUSCULAR | Status: DC

## 2018-09-01 NOTE — Patient Instructions (Signed)
Hip Bursitis  Hip bursitis is inflammation of a fluid-filled sac (bursa) in the hip joint. The bursa protects the bones in the hip joint from rubbing against each other. Hip bursitis can cause mild to moderate pain, and symptoms often come and go over time. What are the causes? This condition may be caused by:  Injury to the hip.  Overuse of the muscles that surround the hip joint.  Arthritis or gout.  Diabetes.  Thyroid disease.  Cold weather.  Infection. In some cases, the cause may not be known. What are the signs or symptoms? Symptoms of this condition may include:  Mild or moderate pain in the hip area. Pain may get worse with movement.  Tenderness and swelling of the hip, especially on the outer side of the hip. Symptoms may come and go. If the bursa becomes infected, you may have the following symptoms:  Fever.  Red skin and a feeling of warmth in the hip area. How is this diagnosed? This condition may be diagnosed based on:  A physical exam.  Your medical history.  X-rays.  Removal of fluid from your inflamed bursa for testing (biopsy). You may be sent to a health care provider who specializes in bone diseases (orthopedist) or a provider who specializes in joint inflammation (rheumatologist). How is this treated? This condition is treated by resting, raising (elevating), and applying pressure(compression) to the injured area. In some cases, this may be enough to make your symptoms go away. Treatment may also include:  Crutches.  Antibiotic medicine.  Draining fluid out of the bursa to help relieve swelling.  Injecting medicine that helps to reduce inflammation (cortisone). Follow these instructions at home: Medicines  Take over-the-counter and prescription medicines only as told by your health care provider.  Do not drive or operate heavy machinery while taking prescription pain medicine, or as told by your health care provider.  If you were  prescribed an antibiotic, take it as told by your health care provider. Do not stop taking the antibiotic even if you start to feel better. Activity  Return to your normal activities as told by your health care provider. Ask your health care provider what activities are safe for you.  Rest and protect your hip as much as possible until your pain and swelling get better. General instructions  Wear compression wraps only as told by your health care provider.  Elevate your hip above the level of your heart as much as you can without pain. To do this, try putting a pillow under your hips while you lie down.  Do not use your hip to support your body weight until your health care provider says that you can. Use crutches as told by your health care provider.  Gently massage and stretch your injured area as often as is comfortable.  Keep all follow-up visits as told by your health care provider. This is important. How is this prevented?  Exercise regularly, as told by your health care provider.  Warm up and stretch before being active.  Cool down and stretch after being active.  If an activity irritates your hip or causes pain, avoid the activity as much as possible.  Avoid sitting down for long periods at a time. Contact a health care provider if:  You have a fever.  You develop new symptoms.  You have difficulty walking or doing everyday activities.  You have pain that gets worse or does not get better with medicine.  You develop red skin or  a feeling of warmth in your hip area. Get help right away if:  You cannot move your hip.  You have severe pain.   ++++++++++++++++++++++++++++++++++++++ Trochanteric Bursitis Trochanteric bursitis is a condition that causes hip pain. Trochanteric bursitis happens when fluid-filled sacs (bursae) in the hip get irritated. Normally these sacs absorb shock and help strong bands of tissue (tendons) in your hip glide smoothly over each other  and over your hip bones. What are the causes? This condition results from increased friction between the hip bones and the tendons that go over them. This condition can happen if you:  Have weak hips.  Use your hip muscles too much (overuse).  Get hit in the hip. What increases the risk? This condition is more likely to develop in:  Women.  Adults who are middle-aged or older.  People with arthritis or a spinal condition.  People with weak buttocks muscles (gluteal muscles).  People who have one leg that is shorter than the other.  People who participate in certain kinds of athletic activities, such as: ? Running sports, especially long-distance running. ? Contact sports, like football or martial arts. ? Sports in which falls may occur, like skiing. What are the signs or symptoms? The main symptom of this condition is pain and tenderness over the point of your hip. The pain may be:  Sharp and intense.  Dull and achy.  Felt on the outside of your thigh. It may increase when you:  Lie on your side.  Walk or run.  Go up on stairs.  Sit.  Stand up after sitting.  Stand for long periods of time. How is this diagnosed? This condition may be diagnosed based on:  Your symptoms.  Your medical history.  A physical exam.  Imaging tests, such as: ? X-rays to check your bones. ? An MRI or ultrasound to check your tendons and muscles. During your physical exam, your health care provider will check the movement and strength of your hip. He or she may press on the point of your hip to check for pain. How is this treated? This condition may be treated by:  Resting.  Reducing your activity.  Avoiding activities that cause pain.  Using crutches, a cane, or a walker to decrease the strain on your hip.  Taking medicine to help with swelling.  Having medicine injected into the bursae to help with swelling.  Using ice, heat, and massage therapy for pain relief.   Physical therapy exercises for strength and flexibility.  Surgery (rare). Follow these instructions at home: Activity  Rest.  Avoid activities that cause pain.  Return to your normal activities as told by your health care provider. Ask your health care provider what activities are safe for you. Managing pain, stiffness, and swelling  Take over-the-counter and prescription medicines only as told by your health care provider.  If directed, apply heat to the injured area as told by your health care provider. ? Place a towel between your skin and the heat source. ? Leave the heat on for 20-30 minutes. ? Remove the heat if your skin turns bright red. This is especially important if you are unable to feel pain, heat, or cold. You may have a greater risk of getting burned.  If directed, apply ice to the injured area: ? Put ice in a plastic bag. ? Place a towel between your skin and the bag. ? Leave the ice on for 20 minutes, 2-3 times a day. General instructions  If  the affected leg is one that you use for driving, ask your health care provider when it is safe to drive.  Use crutches, a cane, or a walker as told by your health care provider.  If one of your legs is shorter than the other, get fitted for a shoe insert.  Lose weight if you are overweight. How is this prevented?  Wear supportive footwear that is appropriate for your sport.  If you have hip pain, start any new exercise or sport slowly.  Maintain physical fitness, including: ? Strength. ? Flexibility. Contact a health care provider if:  Your pain does not improve with 2-4 weeks. Get help right away if:  You develop severe pain.  You have a fever.  You develop increased redness over your hip.  You have a change in your bowel function or bladder function.  You cannot control the muscles in your feet.   +++++++++++++++++++++++++++++++++++++++++++++++++  Bleeding Precautions When on Anticoagulant Therapy,  Adult  Anticoagulant therapy, also called blood thinner therapy, is medicine that helps to prevent and treat blood clots. The medicine works by stopping blood clots from forming or growing. Blood clots that form in your blood vessels can be dangerous. They can break loose and travel to the heart, lungs, or brain. This increases the risk of a heart attack, stroke, or blocked lung artery (pulmonary embolism). Anticoagulants also increase the risk of bleeding. Try to protect yourself from cuts and other injuries that can cause bleeding. It is important to take anticoagulants exactly as told by your health care provider. Why do I need to be on anticoagulant therapy? You may need this medicine if you are at risk of developing a blood clot. Conditions that increase your risk of a blood clot include:  Being born with heart disease or a heart malformation (congenital heart disease).  Developing heart disease.  Having had surgery, such as valve replacement.  Having had a serious accident or other type of severe injury (trauma).  Having certain types of cancer.  Having certain diseases that can increase blood clotting.  Having a high risk of stroke or heart attack.  Having atrial fibrillation (AF). What are the common anticoagulant medicines? There are several types of anticoagulant medicines. The most common types are:  Medicines that you take by mouth (oral medicines), such as: ? Warfarin. ? Novel oral anticoagulants (NOACs), such as: ? Direct thrombin inhibitors (dabigatran). ? Factor Xa inhibitors (apixaban, edoxaban, and rivaroxaban).  Injections, such as: ? Unfractionated heparin. ? Low molecular weight heparin. These anticoagulants work in different ways to prevent blood clots. They also have different risks and side effects. What do I need to remember while on anticoagulant therapy? Taking anticoagulants  Take your medicine at the same time every day. If you forget to take your  medicine, take it as soon as you remember. Do not double your dosage of medicine if you miss a whole day. Take your normal dose and call your health care provider.  Do not stop taking your medicine unless your health care provider approves. Stopping the medicine can increase your risk of developing a blood clot. Taking other medicines  Take over-the-counter and prescriptions medicines only as told by your health care provider.  Do not take over-the-counter NSAIDs, including aspirin and ibuprofen, while you are on anticoagulant therapy. These medicines increase your risk of dangerous bleeding.  Get approval from your health care provider before you start taking any new medicines, vitamins, or herbal products. Some of these could  interfere with your therapy. General instructions  Keep all follow-up visits as told by your health care provider. This is important.  If you are pregnant or trying to get pregnant, talk with a health care provider about anticoagulants. Some of these medicines are not safe to take during pregnancy.  Tell all health care providers, including your dentist, that you are on anticoagulant therapy. It is especially important to tell providers before you have any surgery, medical procedures, or dental work done. What precautions should I take?   Be very careful when using knives, scissors, or other sharp objects.  Use an electric razor instead of a blade.  Do not use toothpicks.  Use a soft-bristled toothbrush. Brush your teeth gently.  Always wear shoes outdoors and wear slippers indoors.  Be careful when cutting your fingernails and toenails.  Place bath mats in the bathroom. If possible, install handrails as well.  Wear gloves while you do yard work.  Wear your seat belt.  Prevent falls by removing loose rugs and extension cords from areas where you walk. Use a cane or walker if you need it.  Avoid constipation by: ? Drinking enough fluid to keep your  urine clear or pale yellow. ? Eating foods that are high in fiber, such as fresh fruits and vegetables, whole grains, and beans. ? Limiting foods that are high in fat and processed sugars, such as fried and sweet foods.  Do not play contact sports or participate in other activities that have a high risk for injury. What other precautions are important if on warfarin therapy? If you are taking a type of anticoagulant called warfarin, make sure you:  Work with a diet and nutrition specialist (dietitian) to make an eating plan. Do not make any sudden changes to your diet after you have started your eating plan.  Do not drink alcohol. It can interfere with your medicine and increase your risk of an injury that causes bleeding.  Get regular blood tests as told by your health care provider. What are some questions to ask my health care provider?  Why do I need anticoagulant therapy?  What is the best anticoagulant therapy for my condition?  How long will I need anticoagulant therapy?  What are the side effects of anticoagulant therapy?  When should I take my medicine? What should I do if I forget to take it?  Will I need to have regular blood tests?  Do I need to change my diet? Are there foods or drinks that I should avoid?  What activities are safe for me?  What should I do if I want to get pregnant? Contact a health care provider if:  You miss a dose of medicine: ? And you are not sure what to do. ? For more than one day.  You have: ? Menstrual bleeding that is heavier than normal. ? Bloody or brown urine. ? Easy bruising. ? Black and tarry stool or bright red stool. ? Side effects from your medicine.  You feel weak or dizzy.  You become pregnant. Get help right away if:  You have bleeding that will not stop within 20 minutes from: ? The nose. ? The gums. ? A cut on the skin.  You have a severe headache or stomachache.  You vomit or cough up blood.  You fall or  hit your head. Summary  Anticoagulant therapy, also called blood thinner therapy, is medicine that helps to prevent and treat blood clots.  Anticoagulants work in different  ways to prevent blood clots. They also have different risks and side effects.  Talk with your health care provider about any precautions that you should take while on anticoagulant therapy.

## 2018-09-01 NOTE — Progress Notes (Addendum)
[facility]  [name], M.D.  [name]. [name], P.A.-C Liane Comber, [place] [address] [place], [place] [address] Telephone [phone] Telefax [phone]  History of Present Illness:     The patient is a very nice 73 y.o. 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 Patient is s/p neck surg in Jan 2020.        WRT his HTN, he denies k/o elevated BP's and has had no sx's of HA's, dizziness, CP, palpitations or  Dyspnea.  Medications  .  insulin NPH-regular Human (NOVOLIN 70/30) (70-30) 100 UNIT/ML injection, Take 20-30 units 2 x day or as directed (Patient taking differently: Inject 12-22 Units into the skin See admin instructions. Injects 22 units in the AM and 12 units in the PM. .  atorvastatin (LIPITOR) 80 MG tablet, Take 40 mg by mouth 3 (three) times a week. Marland Kitchen  allopurinol (ZYLOPRIM) 300 MG tablet, Take 1 tablet (300 mg total) by mouth daily. TAKE 1 TABLET BY MOUTH DAILY FOR GOUT PREVENTION .  aspirin EC 81 MG tablet, Take 81 mg by mouth daily. Marland Kitchen  warfarin (COUMADIN) 5 MG tablet, Take 1.5 tabs (7.5 mg) 4 days a week and 1 tab (5 mg) 3 days a week (MWF). Marland Kitchen  Ascorbic Acid (VITAMIN C) 1000 MG tablet, Take 1,000 mg by mouth daily. .  betamethasone dipropionate (DIPROLENE) 0.05 % cream, Apply 1 application topically daily as needed (eczema).  .  Black Pepper-Turmeric (TURMERIC COMPLEX/BLACK PEPPER PO), Take 1,500 mg by mouth daily. Marland Kitchen  buPROPion (WELLBUTRIN XL) 300 MG 24 hr tablet, TAKE ONE TABLET BY MOUTH EVERY MORNING .  finasteride (PROSCAR) 5 MG tablet, TAKE ONE TABLET BY MOUTH DAILY .  glucose blood (FREESTYLE TEST STRIPS) test strip, Check blood sugar 3 to 4 times daily for medication regulation. Marland Kitchen  latanoprost (XALATAN) 0.005 %  ophthalmic solution, Place 1 drop into both eyes at bedtime.  Marland Kitchen  MAGNESIUM-OXIDE 400 (241.3 Mg) MG tablet, Take 400 mg by mouth 2 (two) times daily.  .  mycophenolate (MYFORTIC) 360 MG TBEC EC tablet, TAKE 1 TABLET BY MOUTH TWICE DAILY (Patient taking differently: Take 360 mg by mouth 2 (two) times daily. ) .  Probiotic Product (PROBIOTIC DAILY PO), Take 1 capsule by mouth daily. .  tacrolimus (PROGRAF) 0.5 MG capsule, Take 0.5 mg by mouth 2 (two) times daily. .  tacrolimus (PROGRAF) 1 MG capsule, Take 1 mg by mouth 2 (two) times daily. Marland Kitchen  VITAMIN D PO, Take 5,000 Units by mouth. Takes two 5000 unit capsules on MWF and 1 capsule 4 days a week. .  Zinc 50 MG CAPS, Take 50 mg by mouth daily.   Problem list He has Chronic systolic heart failure ([place]); CKD stage 2 due to type 2 diabetes mellitus ([place]); Hyperlipidemia, mixed; BPH with obstruction/lower urinary tract symptoms; Gout; PAF (paroxysmal atrial fibrillation) ([place]); OSA (obstructive sleep apnea); Histoplasmosis; Essential hypertension; Vitamin D deficiency; Anticoagulant long-term use; Renal Transplant, s/p 09/2011; Depression, major, in remission ([place]); Thrombocytopenia ([place]); CAD (coronary artery disease) of artery bypass graft; IgA nephropathy; Essential tremor; Overweight (BMI 25.0-29.9); Senile purpura ([place]); Type 2 diabetes mellitus with stage 2 chronic kidney disease, with long-term current use of insulin ([place]); Testosterone deficiency; FHx: heart disease; Former smoker; HNP (herniated nucleus pulposus),  cervical; and S/P cervical spinal fusion on their problem list.   Observations/Objective:  BP 140/76   Pulse 72   Temp (!) 97 F (36.1 C)   Resp 16   Ht 5' 7.5" (1.715 m)   Wt 185 lb 9.6 oz (84.2 kg)   BMI 28.64 kg/m   HEENT - WNL. Neck - supple.  Chest - Clear equal BS. Cor - Nl HS. RRR w/o sig MGR. PP 1(+). No edema. MS- FROM w/o deformities.  Gait Nl. Point tenderness over the Lt Greater Trochanteric bursae. After informed  consent and aseptic prep with alcohol,  the Lt Hip bursa was infiltrated with 1 ml of Lidocaine 1% and 1 ml (10 mg) of Dexamethasone. Patient cautioned re: potential hyperglycemic effects of the Steroid shot.   Neuro -  Nl w/o focal abnormalities. Neg SLR, bilat.   Assessment and Plan:  1. Trochanteric bursitis of left hip  - dexamethasone (DECADRON) injection 10 mg  2. Essential hypertension  - CBC with Differential/Platelet - COMPLETE METABOLIC PANEL WITH GFR  3. PAF (paroxysmal atrial fibrillation) ([place])  - Protime-INR  4. Anticoagulant long-term use  - Protime-INR  5. Medication management  - CBC with Differential/Platelet - COMPLETE METABOLIC PANEL WITH GFR  Follow Up Instructions:       Discussed graduated activities as tolerated. I discussed the assessment and treatment plan with the patient. The patient was provided an opportunity to ask questions and all were answered. The patient agreed with the plan and demonstrated an understanding of the instructions.  I provided  minutes of non-face-to-face time during this encounter.   Kirtland Bouchard, MD

## 2018-09-02 ENCOUNTER — Encounter: Payer: Self-pay | Admitting: Adult Health

## 2018-09-02 LAB — CBC WITH DIFFERENTIAL/PLATELET
Absolute Monocytes: 296 cells/uL (ref 200–950)
Basophils Absolute: 41 cells/uL (ref 0–200)
Basophils Relative: 0.8 %
Eosinophils Absolute: 51 cells/uL (ref 15–500)
Eosinophils Relative: 1 %
HCT: 43.7 % (ref 38.5–50.0)
Hemoglobin: 15 g/dL (ref 13.2–17.1)
Lymphs Abs: 903 cells/uL (ref 850–3900)
MCH: 31.6 pg (ref 27.0–33.0)
MCHC: 34.3 g/dL (ref 32.0–36.0)
MCV: 92.2 fL (ref 80.0–100.0)
MPV: 9.8 fL (ref 7.5–12.5)
Monocytes Relative: 5.8 %
Neutro Abs: 3810 cells/uL (ref 1500–7800)
Neutrophils Relative %: 74.7 %
Platelets: 143 10*3/uL (ref 140–400)
RBC: 4.74 10*6/uL (ref 4.20–5.80)
RDW: 12.3 % (ref 11.0–15.0)
Total Lymphocyte: 17.7 %
WBC: 5.1 10*3/uL (ref 3.8–10.8)

## 2018-09-02 LAB — COMPLETE METABOLIC PANEL WITH GFR
AG Ratio: 1.8 (calc) (ref 1.0–2.5)
ALT: 12 U/L (ref 9–46)
AST: 20 U/L (ref 10–35)
Albumin: 4.3 g/dL (ref 3.6–5.1)
Alkaline phosphatase (APISO): 87 U/L (ref 35–144)
BUN: 18 mg/dL (ref 7–25)
CO2: 27 mmol/L (ref 20–32)
Calcium: 10 mg/dL (ref 8.6–10.3)
Chloride: 106 mmol/L (ref 98–110)
Creat: 0.97 mg/dL (ref 0.70–1.18)
GFR, Est African American: 91 mL/min/{1.73_m2} (ref >=60)
GFR, Est Non African American: 79 mL/min/{1.73_m2} (ref >=60)
Globulin: 2.4 g/dL (calc) (ref 1.9–3.7)
Glucose, Bld: 115 mg/dL — ABNORMAL HIGH (ref 65–99)
Potassium: 4.4 mmol/L (ref 3.5–5.3)
Sodium: 139 mmol/L (ref 135–146)
Total Bilirubin: 1.3 mg/dL — ABNORMAL HIGH (ref 0.2–1.2)
Total Protein: 6.7 g/dL (ref 6.1–8.1)

## 2018-09-02 LAB — PROTIME-INR
INR: 2.9 — ABNORMAL HIGH
Prothrombin Time: 28.2 s — ABNORMAL HIGH (ref 9.0–11.5)

## 2018-09-02 NOTE — Progress Notes (Signed)
Virtual Visit via Telephone Note  I connected with Robert Arnold on 09/03/18 at 10:00 AM EDT by telephone and verified that I am speaking with the correct person using two identifiers.  Location: Patient: home Provider: [facility] office   I discussed the limitations, risks, security and privacy concerns of performing an evaluation and management service by telephone and the availability of in person appointments. I also discussed with the patient that there may be a patient responsible charge related to this service. The patient expressed understanding and agreed to proceed.    I discussed the assessment and treatment plan with the patient. The patient was provided an opportunity to ask questions and all were answered. The patient agreed with the plan and demonstrated an understanding of the instructions.   The patient was advised to call back or seek an in-person evaluation if the symptoms worsen or if the condition fails to improve as anticipated.  I provided 24 minutes of non-face-to-face time during this encounter.   Izora Ribas, NP     MEDICARE ANNUAL WELLNESS VISIT AND FOLLOW UP Assessment:   Encounter for Medicare annual wellness exam 1 year  Essential hypertension - continue medications, DASH diet, exercise and monitor at home. Call if greater than 130/80.  -     CBC with Differential/Platelet  PAF (paroxysmal atrial fibrillation) (HCC) Coumadin levels checked yesterday; at goal  Check INR q month and will adjust medication according to labs.  Discussed if patient falls to immediately contact office or go to ER. Discussed foods that can increase or decrease Coumadin levels. Patient understands to call the office before starting a new medication. Follow up in one month.   Chronic systolic heart failure (HCC) Control blood pressure, cholesterol, glucose, increase exercise.  Weight is up, possible PND, will decrease salt, restrict fluid and close follow up If not  better will get CXR, declines at this time  Thrombocytopenia ([place]) Check CBC at routine visits  Gout due to renal impairment, unspecified chronicity, unspecified site  recheck Uric acid as needed, Diet discussed, continue medications.  Histoplasmosis monitor  Vitamin D deficiency  Medication management  Long term current use of anticoagulant therapy Monitor INR  Benign prostatic hyperplasia without lower urinary tract symptoms Continue medications  CKD stage 2 due to type 2 diabetes mellitus ([place]) Discussed general issues about diabetes pathophysiology and management., Educational material distributed., Suggested low cholesterol diet., Encouraged aerobic exercise., Discussed foot care., Reminded to get yearly retinal exam.  Chronic kidney disease (CKD) stage G3a/A1, moderately decreased glomerular filtration rate (GFR) between 45-59 mL/min/1.73 square meter and albuminuria creatinine ratio less than 30 mg/g (HCC) Continue follow up  Mild episode of recurrent major depressive disorder ([place]) Continue medications  Lifestyle discussed: diet/exerise, sleep hygiene, stress management, hydration   Overweight  - long discussion about weight loss, diet, and exercise -recommended diet heavy in fruits and veggies and low in animal meats, cheeses, and dairy products  Chronic bilateral hip pain Mild, managing with lifestyle modification and tylenol; NSAIDs contraindicated Declines imaging or ortho referral at this time  Type 2 Diabetes Mellitus Education: Reviewed 'ABCs' of diabetes management (respective goals in parentheses):  A1C (<7), blood pressure (<130/80), and cholesterol (LDL <70) Eye Exam yearly and Dental Exam every 6 months - UTD, report requested Dietary recommendations Physical Activity recommendations Due for foot exam; complete at next in person OV; no concerns per patient  Over 30 minutes of exam, counseling, chart review, and critical decision making was  performed  Future  Appointments  Date Time Provider [place]  10/05/2018  9:30 AM Vicie Mutters, PA-C [facility] None  11/17/2018  2:30 PM [name], MD [facility] None  03/17/2019  9:00 AM [name], MD [facility] None     Plan:   During the course of the visit the patient was educated and counseled about appropriate screening and preventive services including:    Pneumococcal vaccine   Influenza vaccine  Prevnar 13  Td vaccine  Screening electrocardiogram  Colorectal cancer screening  Diabetes screening  Glaucoma screening  Nutrition counseling    Subjective:  Robert Arnold is a 73 y.o. male who presents for Medicare Annual Wellness Visit and 3 month follow up for HTN, hyperlipidemia, diabetes, and vitamin D Def.   This year he underwent C6-7 disc decompression and fusion in 04/2018 by Dr. Durene Cal and feels fully resolved without any issues.   He had L steroid injection for L trochanteric bursitis on Monday and feeling significantly improved. He has chronic bilateral hip but doesn't want referral to ortho yet, managing with lifestyle and tylenol PRN.   He has history of depression, was controlled with wellbutrin 300 mg daily.   BMI is Body mass index is 28.45 kg/m., he has been working on diet and exercise. Wt Readings from Last 3 Encounters:  09/03/18 184 lb 6.4 oz (83.6 kg)  09/01/18 185 lb 9.6 oz (84.2 kg)  07/30/18 186 lb 3.2 oz (84.5 kg)   His blood pressure has been controlled at home (recently running 130/75), today their BP is BP: 130/75 He does not workout, works around the house. He denies chest pain, shortness of breath, dizziness.   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   On prograf and myfortic  Lab Results  Component Value Date   GFRNONAA 79 09/01/2018    He has history of Afib and is on coumadin, he is on 7.5 mg 4 days a week and  5 mg other days MWF, came in to have labs checked yesterday, within goal range; no nose bleeds, blood stool/urine. No missed doses, no ABX.  Lab Results  Component Value Date   INR 2.9 (H) 09/01/2018   INR 2.6 (H) 07/28/2018   INR 2.9 (H) 06/18/2018     Medication Review: Current Outpatient Medications on File Prior to Visit  Medication Sig Dispense Refill  . allopurinol (ZYLOPRIM) 300 MG tablet Take 1 tablet (300 mg total) by mouth daily. TAKE 1 TABLET BY MOUTH DAILY FOR GOUT PREVENTION    . Ascorbic Acid (VITAMIN C) 1000 MG tablet Take 1,000 mg by mouth daily.    Marland Kitchen aspirin EC 81 MG tablet Take 81 mg by mouth daily.    Marland Kitchen atorvastatin (LIPITOR) 80 MG tablet Take 40 mg by mouth 3 (three) times a week.    . betamethasone dipropionate (DIPROLENE) 0.05 % cream Apply 1 application topically daily as needed (eczema).     . Black Pepper-Turmeric (TURMERIC COMPLEX/BLACK PEPPER PO) Take 1,500 mg by mouth daily.    Marland Kitchen buPROPion (WELLBUTRIN XL) 300 MG 24 hr tablet TAKE ONE TABLET BY MOUTH EVERY MORNING 90 tablet 1  . finasteride (PROSCAR) 5 MG tablet TAKE ONE TABLET BY MOUTH DAILY 90 tablet 1  . glucose blood (FREESTYLE TEST STRIPS) test strip Check blood sugar 3 to 4 times daily for medication regulation. 450 each PRN  . insulin NPH-regular Human (NOVOLIN 70/30) (70-30) 100 UNIT/ML injection Take 20-30 units  2 x day or as directed (Patient taking differently: Inject 12-22 Units into the skin See admin instructions. Injects 22 units in the AM and 12 units in the PM.) 10 mL 99  . latanoprost (XALATAN) 0.005 % ophthalmic solution Place 1 drop into both eyes at bedtime.   4  . MAGNESIUM-OXIDE 400 (241.3 Mg) MG tablet Take 400 mg by mouth 2 (two) times daily.   6  . mycophenolate (MYFORTIC) 360 MG TBEC EC tablet TAKE 1 TABLET BY MOUTH TWICE DAILY (Patient taking differently: Take 360 mg by mouth 2 (two) times daily. ) 180 tablet 1  . Probiotic Product (PROBIOTIC DAILY PO) Take 1 capsule by mouth daily.     . tacrolimus (PROGRAF) 0.5 MG capsule Take 0.5 mg by mouth 2 (two) times daily.    . tacrolimus (PROGRAF) 1 MG capsule Take 1 mg by mouth 2 (two) times daily.    Marland Kitchen VITAMIN D PO Take 5,000 Units by mouth. Takes two 5000 unit capsules on MWF and 1 capsule 4 days a week.    . warfarin (COUMADIN) 5 MG tablet Take 1.5 tabs (7.5 mg) 4 days a week and 1 tab (5 mg) 3 days a week (MWF). 180 tablet 0  . Zinc 50 MG CAPS Take 50 mg by mouth daily.     Current Facility-Administered Medications on File Prior to Visit  Medication Dose Route Frequency Provider Last Rate Last Dose  . dexamethasone (DECADRON) injection 10 mg  10 mg Intramuscular Once [name], MD        Allergies: Allergies  Allergen Reactions  . Losartan Anaphylaxis  . Crestor [Rosuvastatin]     Elevated LFT's  . Lorazepam     Pt is unsure of reaction   . Morphine And Related Other (See Comments)    Has no effect on pt     Current Problems (verified) has Chronic systolic heart failure ([place]); CKD stage 2 due to type 2 diabetes mellitus ([place]); Hyperlipidemia associated with type 2 diabetes mellitus ([place]); BPH with obstruction/lower urinary tract symptoms; Gout; PAF (paroxysmal atrial fibrillation) ([place]); OSA (obstructive sleep apnea); Histoplasmosis; Essential hypertension; Vitamin D deficiency; Anticoagulant long-term use; Renal Transplant, s/p 09/2011; Depression, major, in remission ([place]); Thrombocytopenia ([place]); CAD (coronary artery disease) of artery bypass graft; IgA nephropathy; Essential tremor; Overweight (BMI 25.0-29.9); Senile purpura ([place]); Type 2 diabetes mellitus with stage 2 chronic kidney disease, with long-term current use of insulin ([place]); Testosterone deficiency; Former smoker; HNP (herniated nucleus pulposus), cervical; S/P cervical spinal fusion; and Chronic hip pain, bilateral on their problem list.  Screening Tests Immunization History  Administered Date(s) Administered  . DT 06/05/2015  . Hepatitis B  04/08/2009  . Influenza, High Dose Seasonal PF 12/27/2013, 01/17/2015, 01/11/2016, 01/02/2018  . Influenza-Unspecified 12/25/2016  . Pneumococcal Conjugate-13 10/25/2013  . Pneumococcal Polysaccharide-23 06/05/2015  . Pneumococcal-Unspecified 04/08/2008  . Tdap 04/08/2008   Preventative care: Last colonoscopy: 03/2016 CT AB 2012 CT chest 2010 Sleep study 2013 Echo 02/2013 Stress test 01/2012  CXR 10/2016  Prior vaccinations: TD or Tdap: 2017  Influenza: 2019 Pneumococcal: 2017 Prevnar13: 2015 Shingles/Zostavax: can not have  Names of Other Physician/Practitioners you currently use: 1. [facility] here for primary care 2. Dr. Nicki Reaper, [facility], last visit Nov 2018, DM no retinopathy 02/2016 need new reports, has R partial retinal detachment, is being referred to retinal sugeon, goes q3-4 months 3. Dr. Mirna Mires, dentist, last visit, 2019 q 6 months  Patient Care Team: [name], MD  as PCP - General (Internal Medicine) Nahser, Wonda Cheng, MD as PCP - Cardiology (Cardiology) Estanislado Emms, MD as Consulting Physician (Nephrology) Bensimhon, Shaune Pascal, MD as Consulting Physician (Cardiology) Estevan Ryder, MD as Referring Physician (Cardiology) Inda Castle, MD (Inactive) as Consulting Physician (Gastroenterology) Macarthur Critchley, OD as Referring Physician (Optometry)  Surgical: He  has a past surgical history that includes Colonoscopy; Peritoneal catheter insertion (2011); AV fistula placement (2011); Kidney transplant (09/25/2011); Coronary angioplasty with stent; Coronary artery bypass graft (2013); and Anterior cervical decomp/discectomy fusion (N/A, 05/04/2018). Family His family history includes COPD in his mother; Diabetes in his mother; Heart attack in his father; Kidney disease in his sister. Social history  He reports that he quit smoking about 27 years ago. He has never used smokeless tobacco. He reports current  alcohol use. He reports that he does not use drugs.  MEDICARE WELLNESS OBJECTIVES: Physical activity: Current Exercise Habits: Home exercise routine, Type of exercise: walking, Frequency (Times/Week): 4, Intensity: Mild, Exercise limited by: None identified Cardiac risk factors: Cardiac Risk Factors include: advanced age (>105men, >32 women);male gender;hypertension;dyslipidemia;diabetes mellitus;smoking/ tobacco exposure Depression/mood screen:   Depression screen [facility] 2/9 09/03/2018  Decreased Interest 0  Down, Depressed, Hopeless 1  PHQ - 2 Score 1  Altered sleeping -  Tired, decreased energy -  Change in appetite -  Feeling bad or failure about yourself  -  Trouble concentrating -  Moving slowly or fidgety/restless -  Suicidal thoughts -  PHQ-9 Score -  Difficult doing work/chores -  Some recent data might be hidden    ADLs:  In your present state of health, do you have any difficulty performing the following activities: 09/03/2018 07/29/2018  Hearing? N N  Vision? N N  Difficulty concentrating or making decisions? N N  Walking or climbing stairs? N N  Dressing or bathing? N N  Doing errands, shopping? N N  Some recent data might be hidden     Cognitive Testing  Alert? Yes  Normal Appearance?Yes  Oriented to person? Yes  Place? Yes   Time? Yes  Recall of three objects?  Yes  Can perform simple calculations? Yes  Displays appropriate judgment?Yes  Can read the correct time from a watch face?Yes  EOL planning: Does Patient Have a Medical Advance Directive?: Yes Type of Advance Directive: Healthcare Power of Attorney, Living will Does patient want to make changes to medical advance directive?: No - Patient declined Copy of [place] in Chart?: No - copy requested   Objective:   Today's Vitals   09/03/18 1011  BP: 130/75  Temp: 97.8 F (36.6 C)  Weight: 184 lb 6.4 oz (83.6 kg)   Body mass index is 28.45 kg/m.  General : Well sounding patient in  no apparent distress HEENT: no hoarseness, no cough for duration of visit Lungs: speaks in complete sentences, no audible wheezing, no apparent distress Neurological: alert, oriented x 3 Psychiatric: pleasant, judgement appropriate    Medicare Attestation I have personally reviewed: The patient's medical and social history Their use of alcohol, tobacco or illicit drugs Their current medications and supplements The patient's functional ability including ADLs,fall risks, home safety risks, cognitive, and hearing and visual impairment Diet and physical activities Evidence for depression or mood disorders  The patient's weight, height, BMI, and visual acuity have been recorded in the chart.  I have made referrals, counseling, and provided education to the patient based on review of the above and I have provided the patient  with a written personalized care plan for preventive services.     Izora Ribas, NP   09/03/2018

## 2018-09-03 ENCOUNTER — Ambulatory Visit: Payer: Medicare Other | Admitting: Adult Health

## 2018-09-03 ENCOUNTER — Encounter: Payer: Self-pay | Admitting: Adult Health

## 2018-09-03 ENCOUNTER — Other Ambulatory Visit: Payer: Self-pay

## 2018-09-03 VITALS — BP 130/75 | Temp 97.8°F | Wt 184.4 lb

## 2018-09-03 DIAGNOSIS — E349 Endocrine disorder, unspecified: Secondary | ICD-10-CM

## 2018-09-03 DIAGNOSIS — G250 Essential tremor: Secondary | ICD-10-CM

## 2018-09-03 DIAGNOSIS — I10 Essential (primary) hypertension: Secondary | ICD-10-CM

## 2018-09-03 DIAGNOSIS — I5022 Chronic systolic (congestive) heart failure: Secondary | ICD-10-CM | POA: Diagnosis not present

## 2018-09-03 DIAGNOSIS — N02B9 Other recurrent and persistent immunoglobulin A nephropathy: Secondary | ICD-10-CM

## 2018-09-03 DIAGNOSIS — Z87891 Personal history of nicotine dependence: Secondary | ICD-10-CM

## 2018-09-03 DIAGNOSIS — M1A3790 Chronic gout due to renal impairment, unspecified ankle and foot, without tophus (tophi): Secondary | ICD-10-CM

## 2018-09-03 DIAGNOSIS — I480 Paroxysmal atrial fibrillation: Secondary | ICD-10-CM | POA: Diagnosis not present

## 2018-09-03 DIAGNOSIS — N028 Recurrent and persistent hematuria with other morphologic changes: Secondary | ICD-10-CM

## 2018-09-03 DIAGNOSIS — G8929 Other chronic pain: Secondary | ICD-10-CM | POA: Insufficient documentation

## 2018-09-03 DIAGNOSIS — G4733 Obstructive sleep apnea (adult) (pediatric): Secondary | ICD-10-CM

## 2018-09-03 DIAGNOSIS — I25708 Atherosclerosis of coronary artery bypass graft(s), unspecified, with other forms of angina pectoris: Secondary | ICD-10-CM

## 2018-09-03 DIAGNOSIS — N138 Other obstructive and reflux uropathy: Secondary | ICD-10-CM

## 2018-09-03 DIAGNOSIS — Z0001 Encounter for general adult medical examination with abnormal findings: Secondary | ICD-10-CM | POA: Diagnosis not present

## 2018-09-03 DIAGNOSIS — Z794 Long term (current) use of insulin: Secondary | ICD-10-CM

## 2018-09-03 DIAGNOSIS — E663 Overweight: Secondary | ICD-10-CM

## 2018-09-03 DIAGNOSIS — E1122 Type 2 diabetes mellitus with diabetic chronic kidney disease: Secondary | ICD-10-CM

## 2018-09-03 DIAGNOSIS — D692 Other nonthrombocytopenic purpura: Secondary | ICD-10-CM

## 2018-09-03 DIAGNOSIS — D696 Thrombocytopenia, unspecified: Secondary | ICD-10-CM

## 2018-09-03 DIAGNOSIS — N401 Enlarged prostate with lower urinary tract symptoms: Secondary | ICD-10-CM

## 2018-09-03 DIAGNOSIS — R6889 Other general symptoms and signs: Secondary | ICD-10-CM

## 2018-09-03 DIAGNOSIS — Z7901 Long term (current) use of anticoagulants: Secondary | ICD-10-CM

## 2018-09-03 DIAGNOSIS — M5020 Other cervical disc displacement, unspecified cervical region: Secondary | ICD-10-CM

## 2018-09-03 DIAGNOSIS — E1169 Type 2 diabetes mellitus with other specified complication: Secondary | ICD-10-CM

## 2018-09-03 DIAGNOSIS — F325 Major depressive disorder, single episode, in full remission: Secondary | ICD-10-CM

## 2018-09-03 DIAGNOSIS — N182 Chronic kidney disease, stage 2 (mild): Secondary | ICD-10-CM

## 2018-09-03 DIAGNOSIS — Z940 Kidney transplant status: Secondary | ICD-10-CM

## 2018-09-03 DIAGNOSIS — E559 Vitamin D deficiency, unspecified: Secondary | ICD-10-CM

## 2018-09-03 DIAGNOSIS — Z0000 Encounter for general adult medical examination without abnormal findings: Secondary | ICD-10-CM

## 2018-09-03 DIAGNOSIS — B399 Histoplasmosis, unspecified: Secondary | ICD-10-CM

## 2018-09-07 ENCOUNTER — Encounter: Payer: Self-pay | Admitting: Internal Medicine

## 2018-09-17 ENCOUNTER — Other Ambulatory Visit: Payer: Self-pay | Admitting: Internal Medicine

## 2018-09-17 DIAGNOSIS — I482 Chronic atrial fibrillation, unspecified: Secondary | ICD-10-CM

## 2018-09-17 MED ORDER — WARFARIN SODIUM 5 MG PO TABS: ORAL_TABLET | ORAL | 3 refills | Status: DC

## 2018-09-23 ENCOUNTER — Other Ambulatory Visit: Payer: Self-pay | Admitting: Internal Medicine

## 2018-09-23 DIAGNOSIS — N401 Enlarged prostate with lower urinary tract symptoms: Secondary | ICD-10-CM

## 2018-09-23 DIAGNOSIS — N138 Other obstructive and reflux uropathy: Secondary | ICD-10-CM

## 2018-09-23 MED ORDER — FINASTERIDE 5 MG PO TABS: ORAL_TABLET | ORAL | 3 refills | Status: DC

## 2018-10-02 NOTE — Progress Notes (Signed)
Assessment and Plan:  PAF (paroxysmal atrial fibrillation) (HCC) Check INR and will adjust medication according to labs.  Follow up in one month.   Chronic systolic heart failure (HCC) Weights stable Emphasized salt restriction, less than 2000mg  a day.. If any increasing shortness of breath, swelling, or chest pressure go to ER immediately.  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  Discussed disease progression and risks Discussed diet/exercise, weight management and risk modification  Senile purpura Discussed process, protect skin, sunscreen  Depression remission - continue medications, stress management techniques discussed, increase water, good sleep hygiene discussed, increase exercise, and increase veggies.   Cough Get CXR, ? GERD, stop tumeric, get on pepcid, follow up.   Graft abnormality ? pseudoaneurysm No side of infection, slight warmth, no discharge, tenderness.  Has follow up Wednesday, will ask them  Further disposition pending results of labs. Discussed med's effects and SE's.   Over 30 minutes of exam, counseling, chart review, and critical decision making was performed.   Future Appointments  Date Time Provider [place]  11/17/2018  2:30 PM [name], MD [facility] None  03/17/2019  9:00 AM [name], MD [facility] None  09/15/2019 10:00 AM Liane Comber, NP [facility] None    ------------------------------------------------------------------------------------------------------------------  HPI 73 y.o.male presents for follow up on CHF, a. Fib on coumadin, htn, weight.  He states he has had a dry cough x 4-6 weeks, hits him 3-4 x a day. Nothing makes it happen, no fever, chills, SOB, wheezing.   His blood pressure has  been controlled at home, today their BP is BP: 130/70  He does workout. He denies chest pain, shortness of breath, dizziness.  He has a history of Systolic CHF (EF 76% 5465), denies dyspnea on exertion, orthopnea, paroxysmal nocturnal dyspnea and edema. Positive for none. Wt Readings from Last 3 Encounters:  10/05/18 185 lb 9.6 oz (84.2 kg)  09/03/18 184 lb 6.4 oz (83.6 kg)  09/01/18 185 lb 9.6 oz (84.2 kg)   Patient is on Coumadin for Afib Patient's last INR is  Lab Results  Component Value Date   INR 2.9 (H) 09/01/2018   INR 2.6 (H) 07/28/2018   INR 2.9 (H) 06/18/2018    Patient denies SOB, CP, dizziness, nose bleeds, easy bleeding, and blood in stool/urine. His coumadin dose was changed last visit. He has not taken ABX, has not missed any doses and denies a fall. We are monitoring his CBC.   Current dose: 1 tab of coumadin 3 days and 1.5 tabs on 4 days a week.  States that left graft is larger than normal, has OV Wednesday. No warmth, redness, tenderness at graft site.  Past Medical History:  Diagnosis Date  . Adenomatous colon polyp   . Allergy   . BPH (benign prostatic hyperplasia)   . CHF (congestive heart failure) ([place])   . 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 Diabetes mellitus type 2, controlled ([place])   . FHx: heart disease 02/27/2018  . Glaucoma   . Gout   . 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 MI (myocardial infarction) ([place]) 09/26/2011  . NSTEMI (non-ST elevated myocardial infarction) ([place]) 11/26/2013   2013   . PAF (paroxysmal atrial fibrillation) ([place])  s/p DC-CV in 6/13. Off coumadin due to ureteral bleed     Allergies  Allergen Reactions  . Losartan Anaphylaxis  . Crestor [Rosuvastatin]     Elevated LFT's  . Lorazepam     Pt is unsure of reaction   . Morphine And Related Other (See Comments)    Has no effect on pt     Current Outpatient Medications on File Prior to Visit  Medication Sig   . allopurinol (ZYLOPRIM) 300 MG tablet Take 1 tablet (300 mg total) by mouth daily. TAKE 1 TABLET BY MOUTH DAILY FOR GOUT PREVENTION  . Ascorbic Acid (VITAMIN C) 1000 MG tablet Take 1,000 mg by mouth daily.  Marland Kitchen aspirin EC 81 MG tablet Take 81 mg by mouth daily.  Marland Kitchen atorvastatin (LIPITOR) 80 MG tablet Take 40 mg by mouth 3 (three) times a week.  . betamethasone dipropionate (DIPROLENE) 0.05 % cream Apply 1 application topically daily as needed (eczema).   . Black Pepper-Turmeric (TURMERIC COMPLEX/BLACK PEPPER PO) Take 1,500 mg by mouth daily.  Marland Kitchen buPROPion (WELLBUTRIN XL) 300 MG 24 hr tablet TAKE ONE TABLET BY MOUTH EVERY MORNING  . finasteride (PROSCAR) 5 MG tablet Take 1 tablet Daily for Prostate  . glucose blood (FREESTYLE TEST STRIPS) test strip Check blood sugar 3 to 4 times daily for medication regulation.  . insulin NPH-regular Human (NOVOLIN 70/30) (70-30) 100 UNIT/ML injection Take 20-30 units 2 x day or as directed (Patient taking differently: Inject 12-22 Units into the skin See admin instructions. Injects 22 units in the AM and 12 units in the PM.)  . latanoprost (XALATAN) 0.005 % ophthalmic solution Place 1 drop into both eyes at bedtime.   Marland Kitchen MAGNESIUM-OXIDE 400 (241.3 Mg) MG tablet Take 400 mg by mouth 2 (two) times daily.   . mycophenolate (MYFORTIC) 360 MG TBEC EC tablet TAKE 1 TABLET BY MOUTH TWICE DAILY (Patient taking differently: Take 360 mg by mouth 2 (two) times daily. )  . Probiotic Product (PROBIOTIC DAILY PO) Take 1 capsule by mouth daily.  . tacrolimus (PROGRAF) 0.5 MG capsule Take 0.5 mg by mouth 2 (two) times daily.  . tacrolimus (PROGRAF) 1 MG capsule Take 1 mg by mouth 2 (two) times daily.  Marland Kitchen VITAMIN D PO Take 5,000 Units by mouth. Takes two 5000 unit capsules on MWF and 1 capsule 4 days a week.  . warfarin (COUMADIN) 5 MG tablet Take 1 to 2 tablets Daily or as directed  . Zinc 50 MG CAPS Take 50 mg by mouth daily.   No current facility-administered medications on file  prior to visit.     ROS: all negative except above.   Physical Exam:  BP 130/70   Temp 97.6 F (36.4 C)   Ht 5' 7.5" (1.715 m)   Wt 185 lb 9.6 oz (84.2 kg)   BMI 28.64 kg/m   General Appearance: Well nourished, in no apparent distress. Eyes: PERRLA, EOMs, conjunctiva no swelling or erythema Sinuses: No Frontal/maxillary tenderness ENT/Mouth: Ext aud canals clear, TMs without erythema, bulging. No erythema, swelling, or exudate on post pharynx.  Tonsils not swollen or erythematous. Hearing normal.  Neck: Supple, thyroid normal.  Respiratory: Respiratory effort normal, BS equal bilaterally without rales, rhonchi, wheezing or stridor.  Cardio: Irregularly irregular, 3/6 early systolic blowing murmur. Brisk peripheral pulses without edema. Fistula to left wrist intact, slightly engorged without erythema, tenderness, discharge, slightly warm. Good distal sensation and cap refill.  Abdomen: Soft, + BS.  Non tender, no guarding, rebound, hernias,  masses. Lymphatics: Non tender without lymphadenopathy.  Musculoskeletal: Full ROM, 5/5 strength, normal gait.  Skin: Warm, dry without rashes, lesions, ecchymosis.  Neuro: Cranial nerves intact. Normal muscle tone, no cerebellar symptoms.  Psych: Awake and oriented X 3, normal affect, Insight and Judgment appropriate.     Vicie Mutters, PA-C 9:57 AM [facility]

## 2018-10-05 ENCOUNTER — Other Ambulatory Visit: Payer: Self-pay

## 2018-10-05 ENCOUNTER — Encounter: Payer: Self-pay | Admitting: Physician Assistant

## 2018-10-05 ENCOUNTER — Ambulatory Visit (INDEPENDENT_AMBULATORY_CARE_PROVIDER_SITE_OTHER): Payer: Medicare Other | Admitting: Physician Assistant

## 2018-10-05 VITALS — BP 130/70 | Temp 97.6°F | Ht 67.5 in | Wt 185.6 lb

## 2018-10-05 DIAGNOSIS — I480 Paroxysmal atrial fibrillation: Secondary | ICD-10-CM

## 2018-10-05 DIAGNOSIS — R059 Cough, unspecified: Secondary | ICD-10-CM

## 2018-10-05 DIAGNOSIS — I5022 Chronic systolic (congestive) heart failure: Secondary | ICD-10-CM | POA: Diagnosis not present

## 2018-10-05 DIAGNOSIS — D696 Thrombocytopenia, unspecified: Secondary | ICD-10-CM

## 2018-10-05 DIAGNOSIS — Z7901 Long term (current) use of anticoagulants: Secondary | ICD-10-CM

## 2018-10-05 DIAGNOSIS — E1122 Type 2 diabetes mellitus with diabetic chronic kidney disease: Secondary | ICD-10-CM

## 2018-10-05 DIAGNOSIS — F325 Major depressive disorder, single episode, in full remission: Secondary | ICD-10-CM

## 2018-10-05 DIAGNOSIS — N182 Chronic kidney disease, stage 2 (mild): Secondary | ICD-10-CM

## 2018-10-05 DIAGNOSIS — I2510 Atherosclerotic heart disease of native coronary artery without angina pectoris: Secondary | ICD-10-CM

## 2018-10-05 DIAGNOSIS — R05 Cough: Secondary | ICD-10-CM

## 2018-10-05 DIAGNOSIS — D692 Other nonthrombocytopenic purpura: Secondary | ICD-10-CM

## 2018-10-05 MED ORDER — FAMOTIDINE 40 MG PO TABS: 40.0000 mg | ORAL_TABLET | Freq: Every evening | ORAL | 1 refills | Status: DC

## 2018-10-05 NOTE — Patient Instructions (Addendum)
Generally a cough is either coming from above or from below- so we will treat this OR it can be from irritation/viral cough  INFORMATION ABOUT YOUR XRAY  Can walk into [address] for an Insurance account manager. They will have the order and take you back. You do not any paper work, I should get the result back today or tomorrow. This order is good for a year.  Can call [phone] to schedule an appointment if you wish.    To treat the nasal drip:  Can do a steroid nasal spary 1-2 sparys at night each nostril. Remember to spray each nostril twice towards the outer part of your eye.  Do not sniff but instead pinch your nose and tilt your head back to help the medicine get into your sinuses.  The best time to do this is at bedtime. Stop if you get blurred vision or nose bleeds.   To treat the reflux Will send in Pepcid 40mg  mg to take once in the morning  To stop irritation: Need to STOP the cough Do sugar free candy Do the tessalon drops VOICE REST is VERY important  If not better in 2 weeks will refer to ENT  Go to the ER or call the office if you get any chest pain, shortness of breath, severe  headache, leg swelling.    Common causes of cough OR hoarseness OR sore throat:   Allergies, Viral Infections, Acid Reflux and Bacterial Infections.    Allergies and viral infections cause a cough OR sore throat by post nasal drip and are often worse at night, can also have sneezing, lower grade fevers, clear/yellow mucus. This is best treated with allergy medications or nasal sprays.  Please get on allegra for 1-2 weeks The strongest is allegra or fexafinadine  Cheapest at [employer], [employer], [employer]   Bacterial infections are more severe than allergies or viral infections with fever, teeth pain, fatigue. This can be treated with prednisone and the same over the counter medication and after 7 days can be treated with an antibiotic.   Silent reflux/GERD can cause a cough OR sore throat OR  hoarseness WITHOUT heart burn because the esophagus that goes to the stomach and trachea that goes to the lungs are very close and when you lay down the acid can irritate your throat and lungs. This can cause hoarseness, cough, and wheezing. Please stop any alcohol or anti-inflammatories like aleve/advil/ibuprofen and start an over the counter Prilosec or omeprazole 1-2 times daily 31mins before food for 2 weeks, then switch to over the counter zantac/ratinidine or pepcid/famotadine once at night for 2 weeks.    sometimes irritation causes more irritation. Try voice rest, use sugar free cough drops to prevent coughing, and try to stop clearing your throat.   If you ever have a cough that does not go away after trying these things please make a follow up visit for further evaluation or we can refer you to a specialist. Or if you ever have shortness of breath or chest pain go to the ER.    Silent reflux: Not all heartburn burns...Marland KitchenMarland KitchenMarland Kitchen  What is LPR? Laryngopharyngeal reflux (LPR) or silent reflux is a condition in which acid that is made in the stomach travels up the esophagus (swallowing tube) and gets to the throat. Not everyone with reflux has a lot of heartburn or indigestion. In fact, many people with LPR never have heartburn. This is why LPR is called SILENT REFLUX, and the terms "Silent reflux"  and "LPR" are often used interchangeably. Because LPR is silent, it is sometimes difficult to diagnose.  How can you tell if you have LPR?  Marland Kitchen Chronic hoarseness- Some people have hoarseness that comes and goes . throat clearing  . Cough . It can cause shortness of breath and cause asthma like symptoms. Marland Kitchen a feeling of a lump in the throat  . difficulty swallowing . a problem with too much nose and throat drainage.  . Some people will feel their esophagus spasm which feels like their heart beating hard and fast, this will usually be after a meal, at rest, or lying down at night.    How do I treat  this? Treatment for LPR should be individualized, and your doctor will suggest the best treatment for you. Generally there are several treatments for LPR: . changing habits and diet to reduce reflux,  . medications to reduce stomach acid, and  . surgery to prevent reflux. Most people with LPR need to modify how and when they eat, as well as take some medication, to get well. Sometimes, nonprescription liquid antacids, such as Maalox, Gelucil and Mylanta are recommended. When used, these antacids should be taken four times each day - one tablespoon one hour after each meal and before bedtime. Dietary and lifestyle changes alone are not often enough to control LPR - medications that reduce stomach acid are also usually needed. These must be prescribed by our doctor.   TIPS FOR REDUCING REFLUX AND LPR Control your LIFE-STYLE and your DIET! Marland Kitchen If you use tobacco, QUIT.  Marland Kitchen Smoking makes you reflux. After every cigarette you have some LPR.  . Don't wear clothing that is too tight, especially around the waist (trousers, corsets, belts).  . Do not lie down just after eating...in fact, do not eat within three hours of bedtime.  . You should be on a low-fat diet.  . Limit your intake of red meat.  . Limit your intake of butter.  Marland Kitchen Avoid fried foods.  . Avoid chocolate  . Avoid cheese.  Marland Kitchen Avoid eggs. Marland Kitchen Specifically avoid caffeine (especially coffee and tea), soda pop (especially cola) and mints.  . Avoid alcoholic beverages, particularly in the evening.

## 2018-10-06 LAB — COMPLETE METABOLIC PANEL WITH GFR
AG Ratio: 1.8 (calc) (ref 1.0–2.5)
ALT: 14 U/L (ref 9–46)
AST: 19 U/L (ref 10–35)
Albumin: 4.2 g/dL (ref 3.6–5.1)
Alkaline phosphatase (APISO): 96 U/L (ref 35–144)
BUN: 18 mg/dL (ref 7–25)
CO2: 27 mmol/L (ref 20–32)
Calcium: 9.9 mg/dL (ref 8.6–10.3)
Chloride: 104 mmol/L (ref 98–110)
Creat: 0.92 mg/dL (ref 0.70–1.18)
GFR, Est African American: 97 mL/min/{1.73_m2} (ref >=60)
GFR, Est Non African American: 84 mL/min/{1.73_m2} (ref >=60)
Globulin: 2.3 g/dL (calc) (ref 1.9–3.7)
Glucose, Bld: 102 mg/dL — ABNORMAL HIGH (ref 65–99)
Potassium: 4.6 mmol/L (ref 3.5–5.3)
Sodium: 140 mmol/L (ref 135–146)
Total Bilirubin: 1.4 mg/dL — ABNORMAL HIGH (ref 0.2–1.2)
Total Protein: 6.5 g/dL (ref 6.1–8.1)

## 2018-10-06 LAB — CBC WITH DIFFERENTIAL/PLATELET
Absolute Monocytes: 328 cells/uL (ref 200–950)
Basophils Absolute: 59 cells/uL (ref 0–200)
Basophils Relative: 1.4 %
Eosinophils Absolute: 59 cells/uL (ref 15–500)
Eosinophils Relative: 1.4 %
HCT: 44.6 % (ref 38.5–50.0)
Hemoglobin: 15 g/dL (ref 13.2–17.1)
Lymphs Abs: 911 cells/uL (ref 850–3900)
MCH: 31.1 pg (ref 27.0–33.0)
MCHC: 33.6 g/dL (ref 32.0–36.0)
MCV: 92.5 fL (ref 80.0–100.0)
MPV: 9.6 fL (ref 7.5–12.5)
Monocytes Relative: 7.8 %
Neutro Abs: 2843 cells/uL (ref 1500–7800)
Neutrophils Relative %: 67.7 %
Platelets: 142 10*3/uL (ref 140–400)
RBC: 4.82 10*6/uL (ref 4.20–5.80)
RDW: 12.5 % (ref 11.0–15.0)
Total Lymphocyte: 21.7 %
WBC: 4.2 10*3/uL (ref 3.8–10.8)

## 2018-10-06 LAB — PROTIME-INR
INR: 2.7 — ABNORMAL HIGH
Prothrombin Time: 26.5 s — ABNORMAL HIGH (ref 9.0–11.5)

## 2018-10-26 DIAGNOSIS — T82898A Other specified complication of vascular prosthetic devices, implants and grafts, initial encounter: Secondary | ICD-10-CM

## 2018-10-26 NOTE — Telephone Encounter (Signed)
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     Future Appointments  Date Time Provider [place]  11/17/2018  2:30 PM [name], MD [facility] None  03/17/2019  9:00 AM [name], MD [facility] None  09/15/2019 10:00 AM Liane Comber, NP [facility] None    ------------------------------------------------------------------------------------------------------------------  HPI 73 y.o.male presents for follow up on CHF, a. Fib on coumadin, htn, weight.  Patient has history of CKD with previous AV fistula use in left wrist, s/p transplant in 2013 no on prograf and no longer using dialysis.  He noted that his left fistula was being engorged and enlarged, he called stating it has gotten even larger and extending proximally to his arm.   Patient is on Coumadin for Afib Patient's last INR is  Lab Results  Component Value Date   INR 2.7 (H) 10/05/2018   INR 2.9 (H) 09/01/2018   INR 2.6 (H) 07/28/2018    Past Medical History:  Diagnosis Date  . Adenomatous colon polyp   . Allergy   . BPH (benign prostatic hyperplasia)   . CHF (congestive heart failure) ([place])   . 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 Diabetes mellitus type 2, controlled ([place])   . FHx: heart disease 02/27/2018  . Glaucoma   . Gout   . 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 MI (myocardial infarction) ([place]) 09/26/2011  . NSTEMI (non-ST elevated myocardial infarction) ([place]) 11/26/2013   2013   . PAF (paroxysmal atrial fibrillation) ([place])    s/p DC-CV in 6/13. Off coumadin due to ureteral bleed     Allergies  Allergen Reactions  . Losartan Anaphylaxis  . Crestor [Rosuvastatin]     Elevated LFT's  . Lorazepam     Pt is unsure of reaction   .  Morphine And Related Other (See Comments)    Has no effect on pt     Current Outpatient Medications on File Prior to Visit  Medication Sig  . allopurinol (ZYLOPRIM) 300 MG tablet Take 1 tablet (300 mg total) by mouth daily. TAKE 1 TABLET BY MOUTH DAILY FOR GOUT PREVENTION  . Ascorbic Acid (VITAMIN C) 1000 MG tablet Take 1,000 mg by mouth daily.  Marland Kitchen aspirin EC 81 MG tablet Take 81 mg by mouth daily.  Marland Kitchen atorvastatin (LIPITOR) 80 MG tablet Take 40 mg by mouth 3 (three) times a week.  . betamethasone dipropionate (DIPROLENE) 0.05 % cream Apply 1 application topically daily as needed (eczema).   . Black Pepper-Turmeric (TURMERIC COMPLEX/BLACK PEPPER PO) Take 1,500 mg by mouth daily.  Marland Kitchen buPROPion (WELLBUTRIN XL) 300 MG 24 hr tablet TAKE ONE TABLET BY MOUTH EVERY MORNING  . famotidine (PEPCID) 40 MG tablet Take 1 tablet (40 mg total) by mouth every evening.  . finasteride (PROSCAR) 5 MG tablet Take 1 tablet Daily for Prostate  . glucose blood (FREESTYLE TEST STRIPS) test strip Check blood sugar 3 to 4 times daily for medication regulation.  . insulin NPH-regular Human (NOVOLIN 70/30) (70-30) 100 UNIT/ML injection Take 20-30 units 2 x day or as directed (Patient taking differently: Inject 12-22 Units into the skin See admin instructions. Injects 22 units in the AM and 12 units in the PM.)  . latanoprost (XALATAN) 0.005 % ophthalmic solution Place 1 drop into both eyes at  bedtime.   Marland Kitchen MAGNESIUM-OXIDE 400 (241.3 Mg) MG tablet Take 400 mg by mouth 2 (two) times daily.   . mycophenolate (MYFORTIC) 360 MG TBEC EC tablet TAKE 1 TABLET BY MOUTH TWICE DAILY (Patient taking differently: Take 360 mg by mouth 2 (two) times daily. )  . Probiotic Product (PROBIOTIC DAILY PO) Take 1 capsule by mouth daily.  . tacrolimus (PROGRAF) 0.5 MG capsule Take 0.5 mg by mouth 2 (two) times daily.  . tacrolimus (PROGRAF) 1 MG capsule Take 1 mg by mouth 2 (two) times daily.  Marland Kitchen VITAMIN D PO Take 5,000 Units by mouth. Takes two  5000 unit capsules on MWF and 1 capsule 4 days a week.  . warfarin (COUMADIN) 5 MG tablet Take 1 to 2 tablets Daily or as directed  . Zinc 50 MG CAPS Take 50 mg by mouth daily.   No current facility-administered medications on file prior to visit.     Physical Exam: EXAM COPIED FROM OFFICE VISIT- PATIENT STATES FISTULA IS GETTING LARGER AND EXTENDING PROXIMALLY  General Appearance: Well nourished, in no apparent distress. Eyes: PERRLA, EOMs, conjunctiva no swelling or erythema Sinuses: No Frontal/maxillary tenderness ENT/Mouth: Ext aud canals clear, TMs without erythema, bulging. No erythema, swelling, or exudate on post pharynx.  Tonsils not swollen or erythematous. Hearing normal.  Neck: Supple, thyroid normal.  Respiratory: Respiratory effort normal, BS equal bilaterally without rales, rhonchi, wheezing or stridor.  Cardio: Irregularly irregular, 3/6 early systolic blowing murmur. Brisk peripheral pulses without edema. Fistula to left wrist intact, slightly engorged without erythema, tenderness, discharge, slightly warm. Good distal sensation and cap refill.  Abdomen: Soft, + BS.  Non tender, no guarding, rebound, hernias, masses. Lymphatics: Non tender without lymphadenopathy.  Musculoskeletal: Full ROM, 5/5 strength, normal gait.  Skin: Warm, dry without rashes, lesions, ecchymosis.  Neuro: Cranial nerves intact. Normal muscle tone, no cerebellar symptoms.  Psych: Awake and oriented X 3, normal affect, Insight and Judgment appropriate.     Vicie Mutters, PA-C 1:23 PM [facility]

## 2018-11-09 ENCOUNTER — Telehealth (HOSPITAL_COMMUNITY): Payer: Self-pay | Admitting: Rehabilitation

## 2018-11-09 NOTE — Telephone Encounter (Signed)

## 2018-11-10 ENCOUNTER — Encounter: Payer: Self-pay | Admitting: Vascular Surgery

## 2018-11-10 ENCOUNTER — Ambulatory Visit (INDEPENDENT_AMBULATORY_CARE_PROVIDER_SITE_OTHER): Payer: Medicare Other | Admitting: Vascular Surgery

## 2018-11-10 ENCOUNTER — Other Ambulatory Visit: Payer: Self-pay

## 2018-11-10 VITALS — BP 154/81 | HR 66 | Temp 97.7°F | Resp 20 | Ht 67.5 in | Wt 187.0 lb

## 2018-11-10 DIAGNOSIS — I2510 Atherosclerotic heart disease of native coronary artery without angina pectoris: Secondary | ICD-10-CM

## 2018-11-10 DIAGNOSIS — I770 Arteriovenous fistula, acquired: Secondary | ICD-10-CM | POA: Diagnosis not present

## 2018-11-10 NOTE — Progress Notes (Signed)
[facility]  Patient name: Robert Arnold MRN: [id number] DOB: 1945/09/21 Sex: male  REASON FOR CONSULT: Evaluation of left AV fistula  HPI: Robert Arnold is a 73 y.o. male, who is here today for evaluation.  He is a very pleasant gentleman who underwent left radiocephalic AV fistula creation in 2011.  He never used the fistula.  He was on home peritoneal dialysis for 2 years and then received a kidney transplant in 2013.  He has had excellent result from his transplant.  He was concern regarding some changes in the size of his fistula and is seen today for discussion of this.  He has had no discomfort and no steal symptoms.  No history of thrombosis.  Past Medical History:  Diagnosis Date  . Adenomatous colon polyp   . Allergy   . BPH (benign prostatic hyperplasia)   . CHF (congestive heart failure) ([place])   . 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 Diabetes mellitus type 2, controlled ([place])   . FHx: heart disease 02/27/2018  . Glaucoma   . Gout   . 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 MI (myocardial infarction) ([place]) 09/26/2011  . NSTEMI (non-ST elevated myocardial infarction) ([place]) 11/26/2013   2013   . PAF (paroxysmal atrial fibrillation) ([place])    s/p DC-CV in 6/13. Off coumadin due to ureteral bleed    Family History  Problem Relation Age of Onset  . Heart attack Father   . Diabetes Mother   . COPD Mother   . Kidney disease Sister   . Colon cancer Neg Hx   . Stomach cancer Neg Hx   . Rectal cancer Neg Hx   . Esophageal cancer Neg Hx   . Liver cancer Neg Hx     SOCIAL HISTORY: Social History   Socioeconomic History  . Marital status: Married    Spouse name: Not on file  . Number of children: 1  . Years of education: Not on file  . Highest education level: Not on file  Occupational History  . Occupation: retired  Scientific laboratory technician  .  Financial resource strain: Not on file  . Food insecurity    Worry: Not on file    Inability: Not on file  . Transportation needs    Medical: Not on file    Non-medical: Not on file  Tobacco Use  . Smoking status: Former Smoker    Quit date: 11/29/1990    Years since quitting: 27.9  . Smokeless tobacco: Never Used  Substance and Sexual Activity  . Alcohol use: Yes    Comment: 1-2 a month  . Drug use: No  . Sexual activity: Not on file  Lifestyle  . Physical activity    Days per week: Not on file    Minutes per session: Not on file  . Stress: Not on file  Relationships  . Social Herbalist on phone: Not on file    Gets together: Not on file    Attends religious service: Not on file    Active member of club or organization: Not on file    Attends meetings of clubs or organizations: Not on file    Relationship status: Not on file  . Intimate partner violence    Fear of current or ex partner: Not on file    Emotionally abused: Not on file    Physically  abused: Not on file    Forced sexual activity: Not on file  Other Topics Concern  . Not on file  Social History Narrative  . Not on file    Allergies  Allergen Reactions  . Losartan Anaphylaxis  . Crestor [Rosuvastatin]     Elevated LFT's  . Lorazepam     Pt is unsure of reaction   . Morphine And Related Other (See Comments)    Has no effect on pt     Current Outpatient Medications  Medication Sig Dispense Refill  . allopurinol (ZYLOPRIM) 300 MG tablet Take 1 tablet (300 mg total) by mouth daily. TAKE 1 TABLET BY MOUTH DAILY FOR GOUT PREVENTION    . Ascorbic Acid (VITAMIN C) 1000 MG tablet Take 1,000 mg by mouth daily.    Marland Kitchen aspirin EC 81 MG tablet Take 81 mg by mouth daily.    Marland Kitchen atorvastatin (LIPITOR) 80 MG tablet Take 40 mg by mouth 3 (three) times a week.    . betamethasone dipropionate (DIPROLENE) 0.05 % cream Apply 1 application topically daily as needed (eczema).     . Black Pepper-Turmeric (TURMERIC  COMPLEX/BLACK PEPPER PO) Take 1,500 mg by mouth daily.    Marland Kitchen buPROPion (WELLBUTRIN XL) 300 MG 24 hr tablet TAKE ONE TABLET BY MOUTH EVERY MORNING 90 tablet 1  . finasteride (PROSCAR) 5 MG tablet Take 1 tablet Daily for Prostate 90 tablet 3  . glucose blood (FREESTYLE TEST STRIPS) test strip Check blood sugar 3 to 4 times daily for medication regulation. 450 each PRN  . insulin NPH-regular Human (NOVOLIN 70/30) (70-30) 100 UNIT/ML injection Take 20-30 units 2 x day or as directed (Patient taking differently: Inject 12-22 Units into the skin See admin instructions. Injects 22 units in the AM and 12 units in the PM.) 10 mL 99  . latanoprost (XALATAN) 0.005 % ophthalmic solution Place 1 drop into both eyes at bedtime.   4  . MAGNESIUM-OXIDE 400 (241.3 Mg) MG tablet Take 400 mg by mouth 2 (two) times daily.   6  . mycophenolate (MYFORTIC) 360 MG TBEC EC tablet TAKE 1 TABLET BY MOUTH TWICE DAILY (Patient taking differently: Take 360 mg by mouth 2 (two) times daily. ) 180 tablet 1  . Probiotic Product (PROBIOTIC DAILY PO) Take 1 capsule by mouth daily.    . tacrolimus (PROGRAF) 0.5 MG capsule Take 0.5 mg by mouth 2 (two) times daily.    . tacrolimus (PROGRAF) 1 MG capsule Take 1 mg by mouth 2 (two) times daily.    . traZODone (DESYREL) 50 MG tablet Take 25 mg by mouth at bedtime as needed for sleep.    Marland Kitchen VITAMIN D PO Take 5,000 Units by mouth. Takes two 5000 unit capsules on MWF and 1 capsule 4 days a week.    . warfarin (COUMADIN) 5 MG tablet Take 1 to 2 tablets Daily or as directed 180 tablet 3  . Zinc 50 MG CAPS Take 50 mg by mouth daily.     No current facility-administered medications for this visit.     REVIEW OF SYSTEMS:  [X]  denotes positive finding, [ ]  denotes negative finding Cardiac  Comments:  Chest pain or chest pressure:    Shortness of breath upon exertion:    Short of breath when lying flat:    Irregular heart rhythm: x       Vascular    Pain in calf, thigh, or hip brought on by  ambulation:    Pain in feet  at night that wakes you up from your sleep:     Blood clot in your veins:    Leg swelling:         Pulmonary    Oxygen at home:    Productive cough:     Wheezing:         Neurologic    Sudden weakness in arms or legs:     Sudden numbness in arms or legs:     Sudden onset of difficulty speaking or slurred speech:    Temporary loss of vision in one eye:     Problems with dizziness:         Gastrointestinal    Blood in stool:     Vomited blood:         Genitourinary    Burning when urinating:     Blood in urine:        Psychiatric    Major depression:         Hematologic    Bleeding problems:    Problems with blood clotting too easily:        Skin    Rashes or ulcers:        Constitutional    Fever or chills:      PHYSICAL EXAM: Vitals:   11/10/18 1035  BP: (!) 154/81  Pulse: 66  Resp: 20  Temp: 97.7 F (36.5 C)  Weight: 187 lb (84.8 kg)  Height: 5' 7.5" (1.715 m)    GENERAL: The patient is a well-nourished male, in no acute distress. The vital signs are documented above. CARDIOVASCULAR: Excellent maturation of left forearm fistula.  Also has dilatation of his cephalic vein above his antecubital space.  There is no evidence of aneurysmal change and no evidence of skin breakdown PULMONARY: There is good air exchange  ABDOMEN: Soft and non-tender  MUSCULOSKELETAL: There are no major deformities or cyanosis. NEUROLOGIC: No focal weakness or paresthesias are detected. SKIN: There are no ulcers or rashes noted. PSYCHIATRIC: The patient has a normal affect.  DATA:  None  MEDICAL ISSUES: I discussed these findings with the patient.  I reassured him that there is no downside of continuing to have his fistula patent.  Explained that this could be used at any time should he have failure of his transplant.  He is having no pain and no skin changes.  He does have moderate dilatation of his cephalic vein at approximately 8 to 9 mm throughout  its course.  He was reassured with this discussion and will see [place] again on an as-needed basis   Rosetta Posner, MD [facility] Tel [phone] Pager [phone]

## 2018-11-16 NOTE — Progress Notes (Signed)
Assessment and Plan:  PAF (paroxysmal atrial fibrillation) (HCC) Check INR and will adjust medication according to labs.  Follow up in one month.   Chronic systolic heart failure (HCC) Weights stable Emphasized salt restriction, less than 2000mg  a day.. If any increasing shortness of breath, swelling, or chest pressure go to ER immediately.  decrease your fluid intake to less than 2 L daily  Thrombocytopenia (HCC) Defer CBC - patient presents with recent labs from nephrology demonstrating stable from previous  Senile purpura Discussed process, protect skin, sunscreen  Depression remission - continue medications, stress management techniques discussed, increase water, good sleep hygiene discussed, increase exercise, and increase veggies.   Essential hypertension -     CBC with Differential/Platelet -     COMPLETE METABOLIC PANEL WITH GFR -     TSH  CKD stage 2 due to type 2 diabetes mellitus (HCC) -     Hemoglobin A1c  Hyperlipidemia associated with type 2 diabetes mellitus ([place]) -     Lipid panel  Type 2 diabetes mellitus with stage 2 chronic kidney disease, with long-term current use of insulin ([place]) Discussed general issues about diabetes pathophysiology and management., Educational material distributed., Suggested low cholesterol diet., Encouraged aerobic exercise., Discussed foot care., Reminded to get yearly retinal exam.   Further disposition pending results of labs. Discussed med's effects and SE's.   Over 30 minutes of exam, counseling, chart review, and critical decision making was performed.   Future Appointments  Date Time Provider [place]  03/17/2019  9:00 AM [name], MD [facility] None  09/15/2019 10:00 AM Liane Comber, NP [facility] None    ------------------------------------------------------------------------------------------------------------------  HPI 73 y.o.male presents for follow up on CHF, a. Fib on coumadin, htn,  weight.  His blood pressure has been controlled at home, today their BP is BP: 120/86  He does workout, walks but not as much do to the heat He denies chest pain, shortness of breath, dizziness. On trazodone 100, 1/2 tablet a night, still has from a year ago.   He has a history of Systolic CHF (EF 35-32%, + dystolic 99/2426), denies dyspnea on exertion, orthopnea, paroxysmal nocturnal dyspnea and edema. Positive for none. Wt Readings from Last 3 Encounters:  11/18/18 187 lb 8 oz (85 kg)  11/10/18 187 lb (84.8 kg)  10/05/18 185 lb 9.6 oz (84.2 kg)   Patient is on Coumadin for Afib Patient's last INR is  Lab Results  Component Value Date   INR 2.7 (H) 10/05/2018   INR 2.9 (H) 09/01/2018   INR 2.6 (H) 07/28/2018    Patient denies SOB, CP, dizziness, nose bleeds, easy bleeding, and blood in stool/urine. His coumadin dose was not changed last visit. He has not taken ABX, has not missed any doses and denies a fall. We are monitoring his CBC.   Current dose: 1 tab of coumadin 3 days and 1.5 tabs on 3 days a week.    He is on cholesterol medication and denies myalgias. His cholesterol is not at goal of less than 70. The cholesterol last visit was:   Lab Results  Component Value Date   CHOL 170 07/28/2018   HDL 67 07/28/2018   LDLCALC 87 07/28/2018   TRIG 74 07/28/2018   CHOLHDL 2.5 07/28/2018    He has been working on diet and exercise for prediabetes, and denies paresthesia of the feet, polydipsia, polyuria and visual disturbances. Last A1C in the office was:  Lab Results  Component Value Date   HGBA1C 5.6  07/28/2018   Patient is on Vitamin D supplement.   Lab Results  Component Value Date   VD25OH 110 (H) 07/28/2018      Past Medical History:  Diagnosis Date  . Adenomatous colon polyp   . Allergy   . BPH (benign prostatic hyperplasia)   . CHF (congestive heart failure) ([place])   . 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 Diabetes  mellitus type 2, controlled ([place])   . FHx: heart disease 02/27/2018  . Glaucoma   . Gout   . 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 MI (myocardial infarction) ([place]) 09/26/2011  . NSTEMI (non-ST elevated myocardial infarction) ([place]) 11/26/2013   2013   . PAF (paroxysmal atrial fibrillation) ([place])    s/p DC-CV in 6/13. Off coumadin due to ureteral bleed     Allergies  Allergen Reactions  . Losartan Anaphylaxis  . Crestor [Rosuvastatin]     Elevated LFT's  . Lorazepam     Pt is unsure of reaction   . Morphine And Related Other (See Comments)    Has no effect on pt     Current Outpatient Medications on File Prior to Visit  Medication Sig  . allopurinol (ZYLOPRIM) 300 MG tablet Take 1 tablet (300 mg total) by mouth daily. TAKE 1 TABLET BY MOUTH DAILY FOR GOUT PREVENTION  . Ascorbic Acid (VITAMIN C) 1000 MG tablet Take 1,000 mg by mouth daily.  Marland Kitchen aspirin EC 81 MG tablet Take 81 mg by mouth daily.  Marland Kitchen atorvastatin (LIPITOR) 80 MG tablet Take 40 mg by mouth 3 (three) times a week.  . betamethasone dipropionate (DIPROLENE) 0.05 % cream Apply 1 application topically daily as needed (eczema).   . Black Pepper-Turmeric (TURMERIC COMPLEX/BLACK PEPPER PO) Take 1,500 mg by mouth daily.  Marland Kitchen buPROPion (WELLBUTRIN XL) 300 MG 24 hr tablet TAKE ONE TABLET BY MOUTH EVERY MORNING  . finasteride (PROSCAR) 5 MG tablet Take 1 tablet Daily for Prostate  . glucose blood (FREESTYLE TEST STRIPS) test strip Check blood sugar 3 to 4 times daily for medication regulation.  . insulin NPH-regular Human (NOVOLIN 70/30) (70-30) 100 UNIT/ML injection Take 20-30 units 2 x day or as directed (Patient taking differently: Inject 12-22 Units into the skin See admin instructions. Injects 22 units in the AM and 12 units in the PM.)  . latanoprost (XALATAN) 0.005 % ophthalmic solution Place 1 drop into both eyes at bedtime.   Marland Kitchen MAGNESIUM-OXIDE 400 (241.3 Mg) MG tablet Take 400 mg by mouth 2 (two) times daily.    . mycophenolate (MYFORTIC) 360 MG TBEC EC tablet TAKE 1 TABLET BY MOUTH TWICE DAILY (Patient taking differently: Take 360 mg by mouth 2 (two) times daily. )  . Probiotic Product (PROBIOTIC DAILY PO) Take 1 capsule by mouth daily.  . tacrolimus (PROGRAF) 0.5 MG capsule Take 0.5 mg by mouth 2 (two) times daily.  . tacrolimus (PROGRAF) 1 MG capsule Take 1 mg by mouth 2 (two) times daily.  . traZODone (DESYREL) 50 MG tablet Take 25 mg by mouth at bedtime as needed for sleep.  Marland Kitchen VITAMIN D PO Take 5,000 Units by mouth. Takes two 5000 unit capsules on MWF and 1 capsule 4 days a week.  . warfarin (COUMADIN) 5 MG tablet Take 1 to 2 tablets Daily or as directed  . Zinc 50 MG CAPS Take 50 mg by mouth daily.   No current facility-administered medications on file prior to  visit.     ROS: all negative except above.   Physical Exam:  BP 120/86   Pulse 75   Temp (!) 97.1 F (36.2 C)   Ht 5' 7.5" (1.715 m)   Wt 187 lb 8 oz (85 kg)   SpO2 99%   BMI 28.93 kg/m   General Appearance: Well nourished, in no apparent distress. Eyes: PERRLA, EOMs, conjunctiva no swelling or erythema Sinuses: No Frontal/maxillary tenderness ENT/Mouth: Ext aud canals clear, TMs without erythema, bulging. No erythema, swelling, or exudate on post pharynx.  Tonsils not swollen or erythematous. Hearing normal.  Neck: Supple, thyroid normal.  Respiratory: Respiratory effort normal, BS equal bilaterally without rales, rhonchi, wheezing or stridor.  Cardio: Irregularly irregular, 3/6 early systolic blowing murmur. Brisk peripheral pulses, left TP delayed compared to right TP, without edema. Fistula to left wrist intact, slightly engorged without erythema, tenderness, discharge, or warmth. Good distal sensation and cap refill.  Abdomen: Soft, + BS.  Non tender, no guarding, rebound, hernias, masses. Lymphatics: Non tender without lymphadenopathy.  Musculoskeletal: Full ROM, 5/5 strength, normal gait.  Skin: Warm, dry without  rashes, lesions, ecchymosis.  Neuro: Cranial nerves intact. Normal muscle tone, no cerebellar symptoms.  Psych: Awake and oriented X 3, normal affect, Insight and Judgment appropriate.     Vicie Mutters, PA-C 9:21 AM [facility]

## 2018-11-17 ENCOUNTER — Ambulatory Visit: Payer: Medicare Other | Admitting: Internal Medicine

## 2018-11-18 ENCOUNTER — Ambulatory Visit (INDEPENDENT_AMBULATORY_CARE_PROVIDER_SITE_OTHER): Payer: Medicare Other | Admitting: Physician Assistant

## 2018-11-18 ENCOUNTER — Encounter: Payer: Self-pay | Admitting: Physician Assistant

## 2018-11-18 ENCOUNTER — Other Ambulatory Visit: Payer: Self-pay

## 2018-11-18 VITALS — BP 120/86 | HR 75 | Temp 97.1°F | Ht 67.5 in | Wt 187.5 lb

## 2018-11-18 DIAGNOSIS — Z794 Long term (current) use of insulin: Secondary | ICD-10-CM

## 2018-11-18 DIAGNOSIS — E785 Hyperlipidemia, unspecified: Secondary | ICD-10-CM

## 2018-11-18 DIAGNOSIS — E1122 Type 2 diabetes mellitus with diabetic chronic kidney disease: Secondary | ICD-10-CM

## 2018-11-18 DIAGNOSIS — I480 Paroxysmal atrial fibrillation: Secondary | ICD-10-CM | POA: Diagnosis not present

## 2018-11-18 DIAGNOSIS — I10 Essential (primary) hypertension: Secondary | ICD-10-CM | POA: Diagnosis not present

## 2018-11-18 DIAGNOSIS — E1169 Type 2 diabetes mellitus with other specified complication: Secondary | ICD-10-CM

## 2018-11-18 DIAGNOSIS — E559 Vitamin D deficiency, unspecified: Secondary | ICD-10-CM

## 2018-11-18 DIAGNOSIS — I5022 Chronic systolic (congestive) heart failure: Secondary | ICD-10-CM | POA: Diagnosis not present

## 2018-11-18 DIAGNOSIS — D696 Thrombocytopenia, unspecified: Secondary | ICD-10-CM

## 2018-11-18 DIAGNOSIS — Z79899 Other long term (current) drug therapy: Secondary | ICD-10-CM

## 2018-11-18 DIAGNOSIS — D692 Other nonthrombocytopenic purpura: Secondary | ICD-10-CM | POA: Diagnosis not present

## 2018-11-18 DIAGNOSIS — I2510 Atherosclerotic heart disease of native coronary artery without angina pectoris: Secondary | ICD-10-CM

## 2018-11-18 DIAGNOSIS — N182 Chronic kidney disease, stage 2 (mild): Secondary | ICD-10-CM

## 2018-11-19 LAB — CBC WITH DIFFERENTIAL/PLATELET
Absolute Monocytes: 369 cells/uL (ref 200–950)
Basophils Absolute: 50 cells/uL (ref 0–200)
Basophils Relative: 1.1 %
Eosinophils Absolute: 81 cells/uL (ref 15–500)
Eosinophils Relative: 1.8 %
HCT: 44.1 % (ref 38.5–50.0)
Hemoglobin: 15 g/dL (ref 13.2–17.1)
Lymphs Abs: 968 cells/uL (ref 850–3900)
MCH: 31.6 pg (ref 27.0–33.0)
MCHC: 34 g/dL (ref 32.0–36.0)
MCV: 93 fL (ref 80.0–100.0)
MPV: 9.7 fL (ref 7.5–12.5)
Monocytes Relative: 8.2 %
Neutro Abs: 3033 cells/uL (ref 1500–7800)
Neutrophils Relative %: 67.4 %
Platelets: 132 10*3/uL — ABNORMAL LOW (ref 140–400)
RBC: 4.74 10*6/uL (ref 4.20–5.80)
RDW: 12.2 % (ref 11.0–15.0)
Total Lymphocyte: 21.5 %
WBC: 4.5 10*3/uL (ref 3.8–10.8)

## 2018-11-19 LAB — COMPLETE METABOLIC PANEL WITH GFR
AG Ratio: 1.9 (calc) (ref 1.0–2.5)
ALT: 13 U/L (ref 9–46)
AST: 19 U/L (ref 10–35)
Albumin: 4.1 g/dL (ref 3.6–5.1)
Alkaline phosphatase (APISO): 88 U/L (ref 35–144)
BUN: 23 mg/dL (ref 7–25)
CO2: 26 mmol/L (ref 20–32)
Calcium: 9.8 mg/dL (ref 8.6–10.3)
Chloride: 105 mmol/L (ref 98–110)
Creat: 1.06 mg/dL (ref 0.70–1.18)
GFR, Est African American: 82 mL/min/{1.73_m2} (ref >=60)
GFR, Est Non African American: 71 mL/min/{1.73_m2} (ref >=60)
Globulin: 2.2 g/dL (calc) (ref 1.9–3.7)
Glucose, Bld: 125 mg/dL — ABNORMAL HIGH (ref 65–99)
Potassium: 4.4 mmol/L (ref 3.5–5.3)
Sodium: 138 mmol/L (ref 135–146)
Total Bilirubin: 1.8 mg/dL — ABNORMAL HIGH (ref 0.2–1.2)
Total Protein: 6.3 g/dL (ref 6.1–8.1)

## 2018-11-19 LAB — LIPID PANEL
Cholesterol: 144 mg/dL (ref <200)
HDL: 64 mg/dL (ref >=40)
LDL Cholesterol (Calc): 66 mg/dL (calc)
Non-HDL Cholesterol (Calc): 80 mg/dL (calc) (ref <130)
Total CHOL/HDL Ratio: 2.3 (calc) (ref <5.0)
Triglycerides: 55 mg/dL (ref <150)

## 2018-11-19 LAB — PROTIME-INR
INR: 2.3 — ABNORMAL HIGH
Prothrombin Time: 22.2 s — ABNORMAL HIGH (ref 9.0–11.5)

## 2018-11-19 LAB — TSH: TSH: 1.33 mIU/L (ref 0.40–4.50)

## 2018-11-19 LAB — HEMOGLOBIN A1C
Hgb A1c MFr Bld: 5.5 % of total Hgb (ref <5.7)
Mean Plasma Glucose: 111 (calc)
eAG (mmol/L): 6.2 (calc)

## 2018-11-19 LAB — MAGNESIUM: Magnesium: 1.8 mg/dL (ref 1.5–2.5)

## 2018-11-25 ENCOUNTER — Other Ambulatory Visit: Payer: Self-pay | Admitting: Internal Medicine

## 2018-12-15 LAB — HM DIABETES EYE EXAM

## 2018-12-16 NOTE — Progress Notes (Signed)
Assessment and Plan: PAF (paroxysmal atrial fibrillation) (HCC) -     Protime-INR Continue medications, rate controlled, follow up cardio  Chronic systolic heart failure ([place]) Stable weight, continue to monitor at home  Senile purpura [facility]) Discussed process, protect skin, sunscreen  CKD stage 2 due to type 2 diabetes mellitus ([place]) -     COMPLETE METABOLIC PANEL WITH GFR Discussed general issues about diabetes pathophysiology and management., Educational material distributed., Suggested low cholesterol diet., Encouraged aerobic exercise., Discussed foot care., Reminded to get yearly retinal exam.  Thrombocytopenia ([place]) -     CBC with Differential/Platelet - stable, continue to monitor  Depression, major, in remission ([place]) - continue medications, stress management techniques discussed, increase water, good sleep hygiene discussed, increase exercise, and increase veggies.   Needs flu shot -     Flu vaccine HIGH DOSE PF  Atypical pigmented skin lesion -     Ambulatory referral to Dermatology - has been increasing in size - rule out atypia  Hypercoagulopathy ([place]) -     Protime-INR  Nasal erythema -     mupirocin ointment (BACTROBAN) 2 %; Place 1 application into the nose 2 (two) times daily.  Further disposition pending results of labs. Discussed med's effects and SE's.   Over 30 minutes of exam, counseling, chart review, and critical decision making was performed.   Future Appointments  Date Time Provider [place]  01/26/2019  2:30 PM [name], MD [facility] None  03/01/2019  8:45 AM Vicie Mutters, PA-C [facility] None  04/05/2019  9:00 AM [name], MD [facility] None  09/15/2019 10:00 AM Liane Comber, NP [facility] None    ------------------------------------------------------------------------------------------------------------------  HPI 73 y.o.male presents for follow up on CHF, a. Fib on coumadin, htn, weight.  States he has a  growth on his left arm x 1 year but has gotten larger over last 3-4 months. Was bleeding at one time. Has no personal history of skin cancer.   He has had cough x May, 3-4 x a day. No fever, chills, SOB, wheezing.  Tried to get off tumeric and on pepcid without help. Never got a chest xray.   His blood pressure has been controlled at home, today their BP is BP: 140/76  He does workout, walks but not as much do to the heat He denies chest pain, shortness of breath, dizziness. On trazodone 100, 1/2 tablet a night, still has from a year ago.   He has a history of Systolic CHF (EF 44-01%, + dystolic 05/7251), denies dyspnea on exertion, orthopnea, paroxysmal nocturnal dyspnea and edema. Positive for none. Wt Readings from Last 3 Encounters:  12/21/18 182 lb (82.6 kg)  11/18/18 187 lb 8 oz (85 kg)  11/10/18 187 lb (84.8 kg)   Patient is on Coumadin for Afib Patient's last INR is  Lab Results  Component Value Date   INR 2.3 (H) 11/18/2018   INR 2.7 (H) 10/05/2018   INR 2.9 (H) 09/01/2018    Patient denies SOB, CP, dizziness, nose bleeds, easy bleeding, and blood in stool/urine. His coumadin dose was not changed last visit. He has not taken ABX, has not missed any doses and denies a fall. We are monitoring his CBC.   Current dose: 1 tab of coumadin 3 days and 1.5 tabs on 4 days a week.    He is on cholesterol medication and denies myalgias. His cholesterol is not at goal of less than 70. The cholesterol last visit was:   Lab Results  Component Value  Date   CHOL 144 11/18/2018   HDL 64 11/18/2018   LDLCALC 66 11/18/2018   TRIG 55 11/18/2018   CHOLHDL 2.3 11/18/2018    He has been working on diet and exercise for prediabetes, and denies paresthesia of the feet, polydipsia, polyuria and visual disturbances. Last A1C in the office was:  Lab Results  Component Value Date   HGBA1C 5.5 11/18/2018   Patient is on Vitamin D supplement.   Lab Results  Component Value Date   VD25OH 110 (H)  07/28/2018      Past Medical History:  Diagnosis Date  . Adenomatous colon polyp   . Allergy   . BPH (benign prostatic hyperplasia)   . CHF (congestive heart failure) ([place])   . 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 Diabetes mellitus type 2, controlled ([place])   . FHx: heart disease 02/27/2018  . Glaucoma   . Gout   . 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 MI (myocardial infarction) ([place]) 09/26/2011  . NSTEMI (non-ST elevated myocardial infarction) ([place]) 11/26/2013   2013   . PAF (paroxysmal atrial fibrillation) ([place])    s/p DC-CV in 6/13. Off coumadin due to ureteral bleed     Allergies  Allergen Reactions  . Losartan Anaphylaxis  . Crestor [Rosuvastatin]     Elevated LFT's  . Lorazepam     Pt is unsure of reaction   . Morphine And Related Other (See Comments)    Has no effect on pt     Current Outpatient Medications on File Prior to Visit  Medication Sig  . allopurinol (ZYLOPRIM) 300 MG tablet Take 1 tablet (300 mg total) by mouth daily. TAKE 1 TABLET BY MOUTH DAILY FOR GOUT PREVENTION  . Ascorbic Acid (VITAMIN C) 1000 MG tablet Take 1,000 mg by mouth daily.  Marland Kitchen aspirin EC 81 MG tablet Take 81 mg by mouth daily.  Marland Kitchen atorvastatin (LIPITOR) 80 MG tablet Take 40 mg by mouth 3 (three) times a week.  . betamethasone dipropionate (DIPROLENE) 0.05 % cream Apply 1 application topically daily as needed (eczema).   . Black Pepper-Turmeric (TURMERIC COMPLEX/BLACK PEPPER PO) Take 1,500 mg by mouth daily.  Marland Kitchen buPROPion (WELLBUTRIN XL) 300 MG 24 hr tablet Take 1 tablet every Morning for Mood  . finasteride (PROSCAR) 5 MG tablet Take 1 tablet Daily for Prostate  . glucose blood (FREESTYLE TEST STRIPS) test strip Check blood sugar 3 to 4 times daily for medication regulation.  . insulin NPH-regular Human (NOVOLIN 70/30) (70-30) 100 UNIT/ML injection Take 20-30 units 2 x day or as directed (Patient taking differently: Inject 12-22 Units  into the skin See admin instructions. Injects 22 units in the AM and 12 units in the PM.)  . latanoprost (XALATAN) 0.005 % ophthalmic solution Place 1 drop into both eyes at bedtime.   Marland Kitchen MAGNESIUM-OXIDE 400 (241.3 Mg) MG tablet Take 400 mg by mouth 2 (two) times daily.   . mycophenolate (MYFORTIC) 360 MG TBEC EC tablet TAKE 1 TABLET BY MOUTH TWICE DAILY (Patient taking differently: Take 360 mg by mouth 2 (two) times daily. )  . Probiotic Product (PROBIOTIC DAILY PO) Take 1 capsule by mouth daily.  . tacrolimus (PROGRAF) 0.5 MG capsule Take 0.5 mg by mouth 2 (two) times daily.  . tacrolimus (PROGRAF) 1 MG capsule Take 1 mg by mouth 2 (two) times daily.  Marland Kitchen VITAMIN D PO Take 5,000 Units by mouth. Takes two 5000  unit capsules on MWF and 1 capsule 4 days a week.  . warfarin (COUMADIN) 5 MG tablet Take 1 to 2 tablets Daily or as directed  . Zinc 50 MG CAPS Take 50 mg by mouth daily.   No current facility-administered medications on file prior to visit.     ROS: all negative except above.   Physical Exam:  BP 140/76   Pulse 85   Temp 98.4 F (36.9 C)   Wt 182 lb (82.6 kg)   SpO2 98%   BMI 28.08 kg/m   General Appearance: Well nourished, in no apparent distress. Eyes: PERRLA, EOMs, conjunctiva no swelling or erythema Sinuses: No Frontal/maxillary tenderness ENT/Mouth: Ext aud canals clear, TMs without erythema, bulging. No erythema, swelling, or exudate on post pharynx.  Tonsils not swollen or erythematous. Hearing normal.  Neck: Supple, thyroid normal.  Respiratory: Respiratory effort normal, BS equal bilaterally without rales, rhonchi, wheezing or stridor.  Cardio: Irregularly irregular, 3/6 early systolic blowing murmur. Brisk peripheral pulses, left TP delayed compared to right TP, without edema. Fistula to left wrist intact, slightly engorged without erythema, tenderness, discharge, or warmth. Good distal sensation and cap refill.  Abdomen: Soft, + BS.  Non tender, no guarding,  rebound, hernias, masses. Lymphatics: Non tender without lymphadenopathy.  Musculoskeletal: Full ROM, 5/5 strength, normal gait.  Skin: See picture. Left forearm with 4 cm white raised nodule with surrounding 2-3 cm of erythema. Warm, dry without rashes, lesions, ecchymosis.  Neuro: Cranial nerves intact. Normal muscle tone, no cerebellar symptoms.  Psych: Awake and oriented X 3, normal affect, Insight and Judgment appropriate.       Vicie Mutters, PA-C 10:06 AM [facility]

## 2018-12-21 ENCOUNTER — Encounter: Payer: Self-pay | Admitting: Physician Assistant

## 2018-12-21 ENCOUNTER — Ambulatory Visit (INDEPENDENT_AMBULATORY_CARE_PROVIDER_SITE_OTHER): Payer: Medicare Other | Admitting: Physician Assistant

## 2018-12-21 ENCOUNTER — Other Ambulatory Visit: Payer: Self-pay

## 2018-12-21 VITALS — BP 140/76 | HR 85 | Temp 98.4°F | Wt 182.0 lb

## 2018-12-21 DIAGNOSIS — L819 Disorder of pigmentation, unspecified: Secondary | ICD-10-CM

## 2018-12-21 DIAGNOSIS — D692 Other nonthrombocytopenic purpura: Secondary | ICD-10-CM

## 2018-12-21 DIAGNOSIS — N182 Chronic kidney disease, stage 2 (mild): Secondary | ICD-10-CM

## 2018-12-21 DIAGNOSIS — L539 Erythematous condition, unspecified: Secondary | ICD-10-CM

## 2018-12-21 DIAGNOSIS — Z23 Encounter for immunization: Secondary | ICD-10-CM | POA: Diagnosis not present

## 2018-12-21 DIAGNOSIS — F325 Major depressive disorder, single episode, in full remission: Secondary | ICD-10-CM

## 2018-12-21 DIAGNOSIS — D6859 Other primary thrombophilia: Secondary | ICD-10-CM

## 2018-12-21 DIAGNOSIS — E1122 Type 2 diabetes mellitus with diabetic chronic kidney disease: Secondary | ICD-10-CM | POA: Diagnosis not present

## 2018-12-21 DIAGNOSIS — D696 Thrombocytopenia, unspecified: Secondary | ICD-10-CM

## 2018-12-21 DIAGNOSIS — I5022 Chronic systolic (congestive) heart failure: Secondary | ICD-10-CM

## 2018-12-21 DIAGNOSIS — I2510 Atherosclerotic heart disease of native coronary artery without angina pectoris: Secondary | ICD-10-CM

## 2018-12-21 DIAGNOSIS — I480 Paroxysmal atrial fibrillation: Secondary | ICD-10-CM | POA: Diagnosis not present

## 2018-12-21 MED ORDER — MUPIROCIN 2 % EX OINT: 1.0000 "application " | TOPICAL_OINTMENT | Freq: Two times a day (BID) | CUTANEOUS | 0 refills | Status: DC

## 2018-12-21 MED ORDER — TRAZODONE HCL 50 MG PO TABS: 50.0000 mg | ORAL_TABLET | Freq: Every evening | ORAL | 3 refills | Status: DC | PRN

## 2018-12-21 NOTE — Patient Instructions (Addendum)
INFORMATION ABOUT YOUR XRAY  Can walk into [address] for an Insurance account manager. They will have the order and take you back. You do not any paper work, I should get the result back today or tomorrow. This order is good for a year.  Can call [phone] to schedule an appointment if you wish.   Generally a cough is either coming from above or from below- so we will treat this OR it can be from irritation/viral cough  To treat the nasal drip: Get on the chlorphenirmine every 6 hours- This medication can make you sleepy but helps with nasal drip- get from over the counter.   Can do a steroid nasal spary 1-2 sparys at night each nostril. Remember to spray each nostril twice towards the outer part of your eye.  Do not sniff but instead pinch your nose and tilt your head back to help the medicine get into your sinuses.  The best time to do this is at bedtime. Stop if you get blurred vision or nose bleeds.   To treat the reflux Will send in prilosec 40 mg to take once in the morning and take prevacid from over the counter at night for 2 weeks- then stop the prilosec and continue the pravacid or famotadine  To stop irritation: Need to STOP the cough Do sugar free candy Do the tessalon drops VOICE REST is VERY important  If not better in 2 weeks will refer to ENT  Go to the ER or call the office if you get any chest pain, shortness of breath, severe  headache, leg swelling.    Common causes of cough OR hoarseness OR sore throat:   Allergies, Viral Infections, Acid Reflux and Bacterial Infections.    Allergies and viral infections cause a cough OR sore throat by post nasal drip and are often worse at night, can also have sneezing, lower grade fevers, clear/yellow mucus. This is best treated with allergy medications or nasal sprays.  Please get on allegra for 1-2 weeks The strongest is allegra or fexafinadine  Cheapest at [employer], [employer], [employer]   Bacterial infections are more severe than  allergies or viral infections with fever, teeth pain, fatigue. This can be treated with prednisone and the same over the counter medication and after 7 days can be treated with an antibiotic.   Silent reflux/GERD can cause a cough OR sore throat OR hoarseness WITHOUT heart burn because the esophagus that goes to the stomach and trachea that goes to the lungs are very close and when you lay down the acid can irritate your throat and lungs. This can cause hoarseness, cough, and wheezing. Please stop any alcohol or anti-inflammatories like aleve/advil/ibuprofen and start an over the counter Prilosec or omeprazole 1-2 times daily 71mins before food for 2 weeks, then switch to over the counter zantac/ratinidine or pepcid/famotadine once at night for 2 weeks.    sometimes irritation causes more irritation. Try voice rest, use sugar free cough drops to prevent coughing, and try to stop clearing your throat.   If you ever have a cough that does not go away after trying these things please make a follow up visit for further evaluation or we can refer you to a specialist. Or if you ever have shortness of breath or chest pain go to the ER.    Silent reflux: Not all heartburn burns...Marland KitchenMarland KitchenMarland Kitchen  What is LPR? Laryngopharyngeal reflux (LPR) or silent reflux is a condition in which acid that is made in the  stomach travels up the esophagus (swallowing tube) and gets to the throat. Not everyone with reflux has a lot of heartburn or indigestion. In fact, many people with LPR never have heartburn. This is why LPR is called SILENT REFLUX, and the terms "Silent reflux" and "LPR" are often used interchangeably. Because LPR is silent, it is sometimes difficult to diagnose.  How can you tell if you have LPR?  Marland Kitchen Chronic hoarseness- Some people have hoarseness that comes and goes . throat clearing  . Cough . It can cause shortness of breath and cause asthma like symptoms. Marland Kitchen a feeling of a lump in the throat  . difficulty  swallowing . a problem with too much nose and throat drainage.  . Some people will feel their esophagus spasm which feels like their heart beating hard and fast, this will usually be after a meal, at rest, or lying down at night.    How do I treat this? Treatment for LPR should be individualized, and your doctor will suggest the best treatment for you. Generally there are several treatments for LPR: . changing habits and diet to reduce reflux,  . medications to reduce stomach acid, and  . surgery to prevent reflux. Most people with LPR need to modify how and when they eat, as well as take some medication, to get well. Sometimes, nonprescription liquid antacids, such as Maalox, Gelucil and Mylanta are recommended. When used, these antacids should be taken four times each day - one tablespoon one hour after each meal and before bedtime. Dietary and lifestyle changes alone are not often enough to control LPR - medications that reduce stomach acid are also usually needed. These must be prescribed by our doctor.   TIPS FOR REDUCING REFLUX AND LPR Control your LIFE-STYLE and your DIET! Marland Kitchen If you use tobacco, QUIT.  Marland Kitchen Smoking makes you reflux. After every cigarette you have some LPR.  . Don't wear clothing that is too tight, especially around the waist (trousers, corsets, belts).  . Do not lie down just after eating...in fact, do not eat within three hours of bedtime.  . You should be on a low-fat diet.  . Limit your intake of red meat.  . Limit your intake of butter.  Marland Kitchen Avoid fried foods.  . Avoid chocolate  . Avoid cheese.  Marland Kitchen Avoid eggs. Marland Kitchen Specifically avoid caffeine (especially coffee and tea), soda pop (especially cola) and mints.  . Avoid alcoholic beverages, particularly in the evening.  Please be aware that some of the medications that you are on can sometimes cause a rare and potentially dangerous adverse reaction, called SEROTONIN SYNDROME: Symptoms of this condition include (but are  not limited to):  Agitation or restlessness, confusion, rapid heart rate and high blood pressure, dilated pupils, loss of muscle coordination or twitching muscles, muscle rigidity/stiffness, sweating and/or flushing, diarrhea, headache, shivering, goose bumps. If you have any of these symptoms you may have to stop the medication. Call your health care provider immediately.  Severe serotonin syndrome can be life-threatening emergency. Signs and symptoms of a severe reaction may include: high fever, seizures, irregular heartbeat, unconsciousness or altered level of awareness or personality changes.  If you have any of these new symptoms, call 911 or have someone take you to the emergency room.

## 2018-12-22 LAB — CBC WITH DIFFERENTIAL/PLATELET
Absolute Monocytes: 339 cells/uL (ref 200–950)
Basophils Absolute: 48 cells/uL (ref 0–200)
Basophils Relative: 1.1 %
Eosinophils Absolute: 101 cells/uL (ref 15–500)
Eosinophils Relative: 2.3 %
HCT: 43.9 % (ref 38.5–50.0)
Hemoglobin: 14.9 g/dL (ref 13.2–17.1)
Lymphs Abs: 915 cells/uL (ref 850–3900)
MCH: 31.3 pg (ref 27.0–33.0)
MCHC: 33.9 g/dL (ref 32.0–36.0)
MCV: 92.2 fL (ref 80.0–100.0)
MPV: 9.9 fL (ref 7.5–12.5)
Monocytes Relative: 7.7 %
Neutro Abs: 2996 cells/uL (ref 1500–7800)
Neutrophils Relative %: 68.1 %
Platelets: 141 10*3/uL (ref 140–400)
RBC: 4.76 10*6/uL (ref 4.20–5.80)
RDW: 12 % (ref 11.0–15.0)
Total Lymphocyte: 20.8 %
WBC: 4.4 10*3/uL (ref 3.8–10.8)

## 2018-12-22 LAB — COMPLETE METABOLIC PANEL WITH GFR
AG Ratio: 1.7 (calc) (ref 1.0–2.5)
ALT: 20 U/L (ref 9–46)
AST: 23 U/L (ref 10–35)
Albumin: 4 g/dL (ref 3.6–5.1)
Alkaline phosphatase (APISO): 91 U/L (ref 35–144)
BUN/Creatinine Ratio: 24 (calc) — ABNORMAL HIGH (ref 6–22)
BUN: 26 mg/dL — ABNORMAL HIGH (ref 7–25)
CO2: 27 mmol/L (ref 20–32)
Calcium: 9.9 mg/dL (ref 8.6–10.3)
Chloride: 103 mmol/L (ref 98–110)
Creat: 1.08 mg/dL (ref 0.70–1.18)
GFR, Est African American: 80 mL/min/{1.73_m2} (ref >=60)
GFR, Est Non African American: 69 mL/min/{1.73_m2} (ref >=60)
Globulin: 2.3 g/dL (calc) (ref 1.9–3.7)
Glucose, Bld: 111 mg/dL — ABNORMAL HIGH (ref 65–99)
Potassium: 4.5 mmol/L (ref 3.5–5.3)
Sodium: 138 mmol/L (ref 135–146)
Total Bilirubin: 0.9 mg/dL (ref 0.2–1.2)
Total Protein: 6.3 g/dL (ref 6.1–8.1)

## 2018-12-22 LAB — PROTIME-INR
INR: 3.5 — ABNORMAL HIGH
Prothrombin Time: 33.6 s — ABNORMAL HIGH (ref 9.0–11.5)

## 2018-12-23 ENCOUNTER — Other Ambulatory Visit: Payer: Self-pay | Admitting: Internal Medicine

## 2018-12-23 ENCOUNTER — Other Ambulatory Visit: Payer: Self-pay

## 2018-12-23 ENCOUNTER — Ambulatory Visit
Admission: RE | Admit: 2018-12-23 | Discharge: 2018-12-23 | Disposition: A | Discharge status: Home / Self Care | Payer: Medicare Other | Source: Ambulatory Visit | Attending: Physician Assistant | Admitting: Physician Assistant

## 2018-12-23 DIAGNOSIS — R059 Cough, unspecified: Secondary | ICD-10-CM

## 2018-12-23 DIAGNOSIS — M1A3790 Chronic gout due to renal impairment, unspecified ankle and foot, without tophus (tophi): Secondary | ICD-10-CM

## 2018-12-23 DIAGNOSIS — R05 Cough: Secondary | ICD-10-CM

## 2018-12-30 ENCOUNTER — Ambulatory Visit: Payer: Medicare Other | Admitting: Adult Health

## 2019-01-26 ENCOUNTER — Other Ambulatory Visit: Payer: Self-pay

## 2019-01-26 ENCOUNTER — Ambulatory Visit: Payer: Medicare Other | Admitting: Internal Medicine

## 2019-01-26 ENCOUNTER — Ambulatory Visit (INDEPENDENT_AMBULATORY_CARE_PROVIDER_SITE_OTHER): Payer: Medicare Other | Admitting: Internal Medicine

## 2019-01-26 VITALS — BP 132/70 | HR 72 | Temp 97.9°F | Resp 16 | Ht 67.5 in | Wt 184.2 lb

## 2019-01-26 DIAGNOSIS — I480 Paroxysmal atrial fibrillation: Secondary | ICD-10-CM | POA: Diagnosis not present

## 2019-01-26 DIAGNOSIS — D6859 Other primary thrombophilia: Secondary | ICD-10-CM

## 2019-01-26 DIAGNOSIS — Z794 Long term (current) use of insulin: Secondary | ICD-10-CM

## 2019-01-26 DIAGNOSIS — I2510 Atherosclerotic heart disease of native coronary artery without angina pectoris: Secondary | ICD-10-CM

## 2019-01-26 DIAGNOSIS — E1122 Type 2 diabetes mellitus with diabetic chronic kidney disease: Secondary | ICD-10-CM | POA: Diagnosis not present

## 2019-01-26 DIAGNOSIS — Z79899 Other long term (current) drug therapy: Secondary | ICD-10-CM

## 2019-01-26 DIAGNOSIS — I10 Essential (primary) hypertension: Secondary | ICD-10-CM

## 2019-01-26 DIAGNOSIS — D692 Other nonthrombocytopenic purpura: Secondary | ICD-10-CM | POA: Diagnosis not present

## 2019-01-26 DIAGNOSIS — Z7901 Long term (current) use of anticoagulants: Secondary | ICD-10-CM

## 2019-01-26 DIAGNOSIS — N182 Chronic kidney disease, stage 2 (mild): Secondary | ICD-10-CM

## 2019-01-26 NOTE — Patient Instructions (Signed)
Bleeding Precautions When on Anticoagulant Therapy  Anticoagulant therapy, also called blood thinner therapy, is medicine that helps to prevent and treat blood clots. The medicine works by stopping blood clots from forming or growing. Blood clots that form in your blood vessels can be dangerous. They can break loose and travel to the heart, lungs, or brain. This increases the risk of a heart attack, stroke, or blocked lung artery (pulmonary embolism). Anticoagulants also increase the risk of bleeding. Try to protect yourself from cuts and other injuries that can cause bleeding. It is important to take anticoagulants exactly as told by your health care provider. Why do I need to be on anticoagulant therapy? You may need this medicine if you are at risk of developing a blood clot. Conditions that increase your risk of a blood clot include:  Being born with heart disease or a heart malformation (congenital heart disease).  Developing heart disease.  Having had surgery, such as valve replacement.  Having had a serious accident or other type of severe injury (trauma).  Having certain types of cancer.  Having certain diseases that can increase blood clotting.  Having a high risk of stroke or heart attack.  Having atrial fibrillation (AF). What are the common anticoagulant medicines? There are several types of anticoagulant medicines. The most common types are:  Medicines that you take by mouth (oral medicines), such as: ? Warfarin. ? Novel oral anticoagulants (NOACs), such as: ? Direct thrombin inhibitors (dabigatran). ? Factor Xa inhibitors (apixaban, edoxaban, and rivaroxaban).  Injections, such as: ? Unfractionated heparin. ? Low molecular weight heparin. These anticoagulants work in different ways to prevent blood clots. They also have different risks and side effects. What do I need to remember while on anticoagulant therapy? Taking anticoagulants  Take your medicine at the same  time every day. If you forget to take your medicine, take it as soon as you remember. Do not double your dosage of medicine if you miss a whole day. Take your normal dose and call your health care provider.  Do not stop taking your medicine unless your health care provider approves. Stopping the medicine can increase your risk of developing a blood clot. Taking other medicines  Take over-the-counter and prescriptions medicines only as told by your health care provider.  Do not take over-the-counter NSAIDs, including aspirin and ibuprofen, while you are on anticoagulant therapy. These medicines increase your risk of dangerous bleeding.  Get approval from your health care provider before you start taking any new medicines, vitamins, or herbal products. Some of these could interfere with your therapy. General instructions  Keep all follow-up visits as told by your health care provider. This is important.  If you are pregnant or trying to get pregnant, talk with a health care provider about anticoagulants. Some of these medicines are not safe to take during pregnancy.  Tell all health care providers, including your dentist, that you are on anticoagulant therapy. It is especially important to tell providers before you have any surgery, medical procedures, or dental work done. What precautions should I take?   Be very careful when using knives, scissors, or other sharp objects.  Use an electric razor instead of a blade.  Do not use toothpicks.  Use a soft-bristled toothbrush. Brush your teeth gently.  Always wear shoes outdoors and wear slippers indoors.  Be careful when cutting your fingernails and toenails.  Place bath mats in the bathroom. If possible, install handrails as well.  Wear gloves while you do  yard work.  Wear your seat belt.  Prevent falls by removing loose rugs and extension cords from areas where you walk. Use a cane or walker if you need it.  Avoid constipation  by: ? Drinking enough fluid to keep your urine clear or pale yellow. ? Eating foods that are high in fiber, such as fresh fruits and vegetables, whole grains, and beans. ? Limiting foods that are high in fat and processed sugars, such as fried and sweet foods.  Do not play contact sports or participate in other activities that have a high risk for injury. What other precautions are important if on warfarin therapy? If you are taking a type of anticoagulant called warfarin, make sure you:  Work with a diet and nutrition specialist (dietitian) to make an eating plan. Do not make any sudden changes to your diet after you have started your eating plan.  Do not drink alcohol. It can interfere with your medicine and increase your risk of an injury that causes bleeding.  Get regular blood tests as told by your health care provider. What are some questions to ask my health care provider?  Why do I need anticoagulant therapy?  What is the best anticoagulant therapy for my condition?  How long will I need anticoagulant therapy?  What are the side effects of anticoagulant therapy?  When should I take my medicine? What should I do if I forget to take it?  Will I need to have regular blood tests?  Do I need to change my diet? Are there foods or drinks that I should avoid?  What activities are safe for me?  What should I do if I want to get pregnant? Contact a health care provider if:  You miss a dose of medicine: ? And you are not sure what to do. ? For more than one day.  You have: ? Menstrual bleeding that is heavier than normal. ? Bloody or brown urine. ? Easy bruising. ? Black and tarry stool or bright red stool. ? Side effects from your medicine.  You feel weak or dizzy.  You become pregnant. Get help right away if:  You have bleeding that will not stop within 20 minutes from: ? The nose. ? The gums. ? A cut on the skin.  You have a severe headache or  stomachache.  You vomit or cough up blood.  You fall or hit your head. Summary  Anticoagulant therapy, also called blood thinner therapy, is medicine that helps to prevent and treat blood clots.  Anticoagulants work in different ways to prevent blood clots. They also have different risks and side effects.  Talk with your health care provider about any precautions that you should take while on anticoagulant therapy. +++++++++++++++++++++++++++++++++   Vit D  & Vit C 1,000 mg   are recommended to help protect  against the Covid-19 and other Corona viruses.    Also it's recommended  to take  Zinc 50 mg  to help  protect against the Covid-19   and best place to get  is also on [employer].[email or web address]  and don't pay more than 6-8 cents /pill !   ===================================== Coronavirus (COVID-19) Are you at risk?  Are you at risk for the Coronavirus (COVID-19)?  To be considered HIGH RISK for Coronavirus (COVID-19), you have to meet the following criteria:  . Traveled to [place], [place], [place], [place] or [place]; or in the [place] to [place], [place], [place]  . or [place];  and have fever, cough, and shortness of breath within the last 2 weeks of travel OR . Been in close contact with a person diagnosed with COVID-19 within the last 2 weeks and have  . fever, cough,and shortness of breath .  . IF YOU DO NOT MEET THESE CRITERIA, YOU ARE CONSIDERED LOW RISK FOR COVID-19.  What to do if you are HIGH RISK for COVID-19?  Marland Kitchen If you are having a medical emergency, call 911. . Seek medical care right away. Before you go to a doctor's office, urgent care or emergency department, .  call ahead and tell them about your recent travel, contact with someone diagnosed with COVID-19  .  and your symptoms.  . You should receive instructions from your physician's office regarding next steps of care.  . When you arrive at healthcare provider, tell the healthcare staff immediately  you have returned from  . visiting [place], [place], [place], [place] or [place]; or traveled in the [place] to [place], [place],  . [place] or [place] in the last two weeks or you have been in close contact with a person diagnosed with  . COVID-19 in the last 2 weeks.   . Tell the health care staff about your symptoms: fever, cough and shortness of breath. . After you have been seen by a medical provider, you will be either: o Tested for (COVID-19) and discharged home on quarantine except to seek medical care if  o symptoms worsen, and asked to  - Stay home and avoid contact with others until you get your results (4-5 days)  - Avoid travel on public transportation if possible (such as bus, train, or airplane) or o Sent to the Emergency Department by EMS for evaluation, COVID-19 testing  and  o possible admission depending on your condition and test results.  What to do if you are LOW RISK for COVID-19?  Reduce your risk of any infection by using the same precautions used for avoiding the common cold or flu:  Marland Kitchen Wash your hands often with soap and warm water for at least 20 seconds.  If soap and water are not readily available,  . use an alcohol-based hand sanitizer with at least 60% alcohol.  . If coughing or sneezing, cover your mouth and nose by coughing or sneezing into the elbow areas of your shirt or coat, .  into a tissue or into your sleeve (not your hands). . Avoid shaking hands with others and consider head nods or verbal greetings only. . Avoid touching your eyes, nose, or mouth with unwashed hands.  . Avoid close contact with people who are sick. . Avoid places or events with large numbers of people in one location, like concerts or sporting events. . Carefully consider travel plans you have or are making. . If you are planning any travel outside or inside the [place], visit the CDC's Travelers' Health webpage for the latest health notices. . If you have some symptoms but  not all symptoms, continue to monitor at home and seek medical attention  . if your symptoms worsen. . If you are having a medical emergency, call 911.   ++++++++++++++++++++++++++++++++ Recommend Adult Low Dose Aspirin or  coated  Aspirin 81 mg daily  To reduce risk of Colon Cancer 40 %,  Skin Cancer 26 % ,  Melanoma 46%  and  Pancreatic cancer 60% ++++++++++++++++++++++++++++++++ Vitamin D goal  is between 70-100.  Please make sure that you are taking your Vitamin  D as directed.  It is very important as a natural anti-inflammatory  helping hair, skin, and nails, as well as reducing stroke and heart attack risk.  It helps your bones and helps with mood. It also decreases numerous cancer risks so please take it as directed.  Low Vit D is associated with a 200-300% higher risk for CANCER  and 200-300% higher risk for HEART   ATTACK  &  STROKE.   .....................................Marland Kitchen It is also associated with higher death rate at younger ages,  autoimmune diseases like Rheumatoid arthritis, Lupus, Multiple Sclerosis.    Also many other serious conditions, like depression, Alzheimer's Dementia, infertility, muscle aches, fatigue, fibromyalgia - just to name a few. ++++++++++++++++++++ Recommend the book "The END of DIETING" by Dr Excell Seltzer  & the book "The END of DIABETES " by Dr Excell Seltzer At [email or web address] - get book & Audio CD's    Being diabetic has a  300% increased risk for heart attack, stroke, cancer, and alzheimer- type vascular dementia. It is very important that you work harder with diet by avoiding all foods that are white. Avoid white rice (brown & wild rice is OK), white potatoes (sweetpotatoes in moderation is OK), White bread or wheat bread or anything made out of white flour like bagels, donuts, rolls, buns, biscuits, cakes, pastries, cookies, pizza crust, and pasta (made from white flour & egg whites) - vegetarian pasta or spinach or wheat pasta is OK. Multigrain  breads like Arnold's or [employer], or multigrain sandwich thins or flatbreads.  Diet, exercise and weight loss can reverse and cure diabetes in the early stages.  Diet, exercise and weight loss is very important in the control and prevention of complications of diabetes which affects every system in your body, ie. Brain - dementia/stroke, eyes - glaucoma/blindness, heart - heart attack/heart failure, kidneys - dialysis, stomach - gastric paralysis, intestines - malabsorption, nerves - severe painful neuritis, circulation - gangrene & loss of a leg(s), and finally cancer and Alzheimers.    I recommend avoid fried & greasy foods,  sweets/candy, white rice (brown or wild rice or Quinoa is OK), white potatoes (sweet potatoes are OK) - anything made from white flour - bagels, doughnuts, rolls, buns, biscuits,white and wheat breads, pizza crust and traditional pasta made of white flour & egg white(vegetarian pasta or spinach or wheat pasta is OK).  Multi-grain bread is OK - like multi-grain flat bread or sandwich thins. Avoid alcohol in excess. Exercise is also important.    Eat all the vegetables you want - avoid meat, especially red meat and dairy - especially cheese.  Cheese is the most concentrated form of trans-fats which is the worst thing to clog up our arteries. Veggie cheese is OK which can be found in the fresh produce section at [employer] or [employer] or [employer]  +++++++++++++++++++++ DASH Eating Plan  DASH stands for "Dietary Approaches to Stop Hypertension."   The DASH eating plan is a healthy eating plan that has been shown to reduce high blood pressure (hypertension). Additional health benefits may include reducing the risk of type 2 diabetes mellitus, heart disease, and stroke. The DASH eating plan may also help with weight loss. WHAT DO I NEED TO KNOW ABOUT THE DASH EATING PLAN? For the DASH eating plan, you will follow these general guidelines:  Choose foods with a percent  daily value for sodium of less than 5% (as listed on the food label).  Use salt-free seasonings or herbs instead of  table salt or sea salt.  Check with your health care provider or pharmacist before using salt substitutes.  Eat lower-sodium products, often labeled as "lower sodium" or "no salt added."  Eat fresh foods.  Eat more vegetables, fruits, and low-fat dairy products.  Choose whole grains. Look for the word "whole" as the first word in the ingredient list.  Choose fish   Limit sweets, desserts, sugars, and sugary drinks.  Choose heart-healthy fats.  Eat veggie cheese   Eat more home-cooked food and less restaurant, buffet, and fast food.  Limit fried foods.  Cook foods using methods other than frying.  Limit canned vegetables. If you do use them, rinse them well to decrease the sodium.  When eating at a restaurant, ask that your food be prepared with less salt, or no salt if possible.                      WHAT FOODS CAN I EAT? Read Dr Fara Olden Fuhrman's books on The End of Dieting & The End of Diabetes  Grains Whole grain or whole wheat bread. Brown rice. Whole grain or whole wheat pasta. Quinoa, bulgur, and whole grain cereals. Low-sodium cereals. Corn or whole wheat flour tortillas. Whole grain cornbread. Whole grain crackers. Low-sodium crackers.  Vegetables Fresh or frozen vegetables (raw, steamed, roasted, or grilled). Low-sodium or reduced-sodium tomato and vegetable juices. Low-sodium or reduced-sodium tomato sauce and paste. Low-sodium or reduced-sodium canned vegetables.   Fruits All fresh, canned (in natural juice), or frozen fruits.  Protein Products  All fish and seafood.  Dried beans, peas, or lentils. Unsalted nuts and seeds. Unsalted canned beans.  Dairy Low-fat dairy products, such as skim or 1% milk, 2% or reduced-fat cheeses, low-fat ricotta or cottage cheese, or plain low-fat yogurt. Low-sodium or reduced-sodium cheeses.  Fats and Oils Tub  margarines without trans fats. Light or reduced-fat mayonnaise and salad dressings (reduced sodium). Avocado. Safflower, olive, or canola oils. Natural peanut or almond butter.  Other Unsalted popcorn and pretzels. The items listed above may not be a complete list of recommended foods or beverages. Contact your dietitian for more options.  +++++++++++++++  WHAT FOODS ARE NOT RECOMMENDED? Grains/ White flour or wheat flour White bread. White pasta. White rice. Refined cornbread. Bagels and croissants. Crackers that contain trans fat.  Vegetables  Creamed or fried vegetables. Vegetables in a . Regular canned vegetables. Regular canned tomato sauce and paste. Regular tomato and vegetable juices.  Fruits Dried fruits. Canned fruit in light or heavy syrup. Fruit juice.  Meat and Other Protein Products Meat in general - RED meat & White meat.  Fatty cuts of meat. Ribs, chicken wings, all processed meats as bacon, sausage, bologna, salami, fatback, hot dogs, bratwurst and packaged luncheon meats.  Dairy Whole or 2% milk, cream, half-and-half, and cream cheese. Whole-fat or sweetened yogurt. Full-fat cheeses or blue cheese. Non-dairy creamers and whipped toppings. Processed cheese, cheese spreads, or cheese curds.  Condiments Onion and garlic salt, seasoned salt, table salt, and sea salt. Canned and packaged gravies. Worcestershire sauce. Tartar sauce. Barbecue sauce. Teriyaki sauce. Soy sauce, including reduced sodium. Steak sauce. Fish sauce. Oyster sauce. Cocktail sauce. Horseradish. Ketchup and mustard. Meat flavorings and tenderizers. Bouillon cubes. Hot sauce. Tabasco sauce. Marinades. Taco seasonings. Relishes.  Fats and Oils Butter, stick margarine, lard, shortening and bacon fat. Coconut, palm kernel, or palm oils. Regular salad dressings.  Pickles and olives. Salted popcorn and pretzels.  The items listed above may not  be a complete list of foods and beverages to avoid.

## 2019-01-26 NOTE — Progress Notes (Signed)
History of Present Illness:     Patient is a very nice 73 yo MWM with hx/o HTN, HLD, Insulin Requiring T2_DM & hx/o pAfib s/p CABG who also is a renal transplant recipient who presents for coagulation monitoring.      He denies any problems with bleeding or bruiosing and Cardio-respiratory systems review is negative.   Medications bolic):  Marland Kitchen  NOVOLIN 70/30,  Injects 22 units / AM and 12 units / PM.) .  atorvastatin (LIPITOR) 80 MG tablet, Take 40 mg  3  times a week. Marland Kitchen  allopurinol (ZYLOPRIM) 300 MG tablet, Take 1 tablet Daily for Gout Prevention .  aspirin EC 81 MG tablet, Take 81 mg by mouth daily. Marland Kitchen  warfarin  5 MG tablet, Take 1 to 2 tablets Daily or as directed .  VITAMIN C 1000 MG , Take daily. Marland Kitchen  DIPROLENE 0.05 % cream, Apply 1 application topically daily .  Black Pepper-Turmeric, Take 1,500 mg daily. Marland Kitchen  buPROPion-WELLBUTRIN XL 300 MG , Take 1 tablet every Morning  .  finasteride  5 MG tablet, Take 1 tablet Daily for Prostate .  XALATAN 0.005 % ophth soln, Place 1 drop into both eyes at bedtime.  Marland Kitchen  MAGNESIUM-OXIDE 400, Take  2 times daily.  .  mupirocin ointment 2 %, 1 application into the nose 2 times daily. .  mycophenolate  360 MG EC tablet, TAKE 1 TABLET TWICE DAILY  .  PROBIOTIC , Take 1 capsule  daily. Marland Kitchen  PROGRAF 0.5 MG capsule, Take  2 times daily. Marland Kitchen  PROGRAF 1.0 MG capsule, Take  2  times daily. .  traZODone 50 MG tablet, Take 1 tablet at bedtime as needed for sleep. Marland Kitchen  VITAMIN D PO, Take 5,000 Units by mouth. Takes two 5000 unit capsules on MWF and 1 capsule 4 days a week. .  Zinc 50 MG CAPS, Take 50 mg by mouth daily.  Problem list He has Chronic systolic heart failure ([place]); CKD stage 2 due to type 2 diabetes mellitus ([place]); Hyperlipidemia associated with type 2 diabetes mellitus ([place]); BPH with obstruction/lower urinary tract symptoms; Gout; PAF (paroxysmal atrial fibrillation) ([place]); OSA (obstructive sleep apnea); Histoplasmosis; Essential hypertension;  Vitamin D deficiency; Anticoagulant long-term use; Renal Transplant, s/p 09/2011; Depression, major, in remission ([place]); Thrombocytopenia ([place]); CAD (coronary artery disease) of artery bypass graft; IgA nephropathy; Essential tremor; Overweight (BMI 25.0-29.9); Senile purpura ([place]); Type 2 diabetes mellitus with stage 2 chronic kidney disease, with long-term current use of insulin ([place]); Testosterone deficiency; Former smoker; HNP (herniated nucleus pulposus), cervical; S/P cervical spinal fusion; Chronic hip pain, bilateral; and Hypercoagulopathy ([place]) on their problem list.   Observations/Objective:  BP 132/70   Pulse 72   Temp 97.9 F (36.6 C)   Resp 16   Ht 5' 7.5" (1.715 m)   Wt 184 lb 3.2 oz (83.6 kg)   BMI 28.42 kg/m   HEENT - WNL. Neck - supple.  Chest - Clear equal BS. Cor - Nl HS. RRR w/o sig MGR. PP 1(+). No edema. MS- FROM w/o deformities.  Gait Nl. Neuro -  Nl w/o focal abnormalities.  Assessment and Plan:  1. Essential hypertension  - CBC with Differential/Platelet - COMPLETE METABOLIC PANEL WITH GFR  2. PAF (paroxysmal atrial fibrillation) ([place])  - Protime-INR  3. Type 2 diabetes mellitus with stage 2 chronic kidney disease, with long-term current use of insulin ([facility])  - COMPLETE METABOLIC PANEL WITH GFR  4. Senile purpura ([facility])  - CBC  with Differential/Platelet - Protime-INR  5. Thrombophilia ([place])  - Protime-INR  6. Anticoagulant long-term use  - Protime-INR  7. Medication management  - CBC with Differential/Platelet - COMPLETE METABOLIC PANEL WITH GFR - Protime-INR      I discussed the assessment and treatment plan with the patient. The patient was provided an opportunity to ask questions and all were answered. The patient agreed with the plan and demonstrated an understanding of the instructions.  The patient was advised to call back or seek an in-person evaluation if the symptoms worsen or if the condition fails to improve as anticipated.   Between 15-20 minutes of counseling, chart review, and critical decision making was performed   Kirtland Bouchard, MD

## 2019-01-27 LAB — CBC WITH DIFFERENTIAL/PLATELET
Absolute Monocytes: 488 cells/uL (ref 200–950)
Basophils Absolute: 52 cells/uL (ref 0–200)
Basophils Relative: 0.8 %
Eosinophils Absolute: 72 cells/uL (ref 15–500)
Eosinophils Relative: 1.1 %
HCT: 45.6 % (ref 38.5–50.0)
Hemoglobin: 15.4 g/dL (ref 13.2–17.1)
Lymphs Abs: 1417 cells/uL (ref 850–3900)
MCH: 32.2 pg (ref 27.0–33.0)
MCHC: 33.8 g/dL (ref 32.0–36.0)
MCV: 95.2 fL (ref 80.0–100.0)
MPV: 9.8 fL (ref 7.5–12.5)
Monocytes Relative: 7.5 %
Neutro Abs: 4472 cells/uL (ref 1500–7800)
Neutrophils Relative %: 68.8 %
Platelets: 149 10*3/uL (ref 140–400)
RBC: 4.79 10*6/uL (ref 4.20–5.80)
RDW: 12.4 % (ref 11.0–15.0)
Total Lymphocyte: 21.8 %
WBC: 6.5 10*3/uL (ref 3.8–10.8)

## 2019-01-27 LAB — COMPLETE METABOLIC PANEL WITH GFR
AG Ratio: 1.9 (calc) (ref 1.0–2.5)
ALT: 21 U/L (ref 9–46)
AST: 25 U/L (ref 10–35)
Albumin: 4.1 g/dL (ref 3.6–5.1)
Alkaline phosphatase (APISO): 107 U/L (ref 35–144)
BUN: 23 mg/dL (ref 7–25)
CO2: 27 mmol/L (ref 20–32)
Calcium: 9.7 mg/dL (ref 8.6–10.3)
Chloride: 106 mmol/L (ref 98–110)
Creat: 1.15 mg/dL (ref 0.70–1.18)
GFR, Est African American: 74 mL/min/{1.73_m2} (ref >=60)
GFR, Est Non African American: 64 mL/min/{1.73_m2} (ref >=60)
Globulin: 2.2 g/dL (calc) (ref 1.9–3.7)
Glucose, Bld: 57 mg/dL — ABNORMAL LOW (ref 65–99)
Potassium: 4.5 mmol/L (ref 3.5–5.3)
Sodium: 142 mmol/L (ref 135–146)
Total Bilirubin: 1.2 mg/dL (ref 0.2–1.2)
Total Protein: 6.3 g/dL (ref 6.1–8.1)

## 2019-01-27 LAB — PROTIME-INR
INR: 1.7 — ABNORMAL HIGH
Prothrombin Time: 17.7 s — ABNORMAL HIGH (ref 9.0–11.5)

## 2019-01-30 ENCOUNTER — Encounter: Payer: Self-pay | Admitting: Internal Medicine

## 2019-02-01 ENCOUNTER — Encounter: Payer: Self-pay | Admitting: Nephrology

## 2019-02-24 NOTE — Progress Notes (Signed)
Assessment and Plan:  Hypercoagulopathy ([place]) -     PT (Prothrombin Time)  Diarrhea of presumed infectious origin -     TSH -     Gastrointestinal Pathogen Panel PCR- use R19.8 for medicare; Future Non tender AB, no recent ABX, no fever, chills, able to eat and drink - will check labs including stool sample If any changes in symptoms or any changes in lab will get imaging/start ABX Call the office or go to the ER if bloody stools, persistent diarrhea, not peeing regularly, unable to take oral fluids, vomiting, high fever, severe weakness, abdominal pain or failure to improve in 3 days.  Medication management -     Magnesium  Vitamin D deficiency -     Vitamin D (25 hydroxy)  PAF (paroxysmal atrial fibrillation) (HCC) Check INR and will adjust medication according to labs.  Follow up in one month.   Chronic systolic heart failure (HCC) Weights stable Emphasized salt restriction, less than 2000mg  a day.. If any increasing shortness of breath, swelling, or chest pressure go to ER immediately.  decrease your fluid intake to less than 2 L daily  Thrombocytopenia (HCC) monitor  Senile purpura Discussed process, protect skin, sunscreen  Depression remission - continue medications, stress management techniques discussed, increase water, good sleep hygiene discussed, increase exercise, and increase veggies.   Essential hypertension -     CBC with Differential/Platelet -     COMPLETE METABOLIC PANEL WITH GFR -     TSH  CKD stage 2 due to type 2 diabetes mellitus (HCC) -     Hemoglobin A1c  Hyperlipidemia associated with type 2 diabetes mellitus ([place]) -     Lipid panel  Type 2 diabetes mellitus with stage 2 chronic kidney disease, with long-term current use of insulin ([place]) Discussed general issues about diabetes pathophysiology and management., Educational material distributed., Suggested low cholesterol diet., Encouraged aerobic exercise., Discussed foot care., Reminded to get  yearly retinal exam.   Further disposition pending results of labs. Discussed med's effects and SE's.   Over 30 minutes of exam, counseling, chart review, and critical decision making was performed.   Future Appointments  Date Time Provider [place]  04/05/2019  9:00 AM [name], MD [facility] None  09/15/2019 10:00 AM Liane Comber, NP [facility] None    ------------------------------------------------------------------------------------------------------------------  HPI 73 y.o.male presents for follow up on CHF, a. Fib on coumadin, htn, weight.  He states he has been having stomach issues last 3-4 days, has been just water. He is having cramping with AB pain relieved with BM.  Patient denies blood in stool, fever, illness in household contacts, recent antibiotic use, unintentional weight loss, nausea, vomiting, weakness.   Patient denies daycare exposure, exposure to illness, hospitalization, recent antibiotic treatment, unusual food and untreated water.  Last Colonoscopy: 03/2016 q 71 years CT AB 2012 normal BMI is Body mass index is 28.24 kg/m., he is working on diet and exercise. Wt Readings from Last 3 Encounters:  03/01/19 183 lb (83 kg)  01/26/19 184 lb 3.2 oz (83.6 kg)  12/21/18 182 lb (82.6 kg)   He has always had tinnitus but last week he had bilateral tinnitus with his heart beat, left greater than right, lasted 3-4 mins while sitting in his recliner going back. Not with exertion and no accompaniments.  He denies any associated neurological complications or symptoms, such as one-sided weakness, numbness, tingling, slurring of speech, droopy face, swallowing difficulties, diplopia, vision loss, hearing loss or tinnitus.   His  blood pressure has been controlled at home, today their BP is BP: 114/68  He does workout, walks but not as much do to the heat He denies chest pain, shortness of breath, dizziness. On trazodone 100, 1/2 tablet a night, still has  from a year ago.   He has a history of Systolic CHF (EF 42-59%, + dystolic 56/3875), denies dyspnea on exertion, orthopnea, paroxysmal nocturnal dyspnea and edema. Positive for none. Wt Readings from Last 3 Encounters:  03/01/19 183 lb (83 kg)  01/26/19 184 lb 3.2 oz (83.6 kg)  12/21/18 182 lb (82.6 kg)   Patient is on Coumadin for Afib Patient's last INR is  Lab Results  Component Value Date   INR 1.7 (H) 01/26/2019   INR 3.5 (H) 12/21/2018   INR 2.3 (H) 11/18/2018    Patient denies SOB, CP, dizziness, nose bleeds, easy bleeding, and blood in stool/urine. His coumadin dose was not changed last visit. He has not taken ABX, has not missed any doses and denies a fall. We are monitoring his CBC.   Current dose: 1 tab of coumadin 3 days and 1.5 tabs on 3 days a week.    He is on cholesterol medication and denies myalgias. His cholesterol is not at goal of less than 70. The cholesterol last visit was:   Lab Results  Component Value Date   CHOL 144 11/18/2018   HDL 64 11/18/2018   LDLCALC 66 11/18/2018   TRIG 55 11/18/2018   CHOLHDL 2.3 11/18/2018    He has been working on diet and exercise for prediabetes, and denies paresthesia of the feet, polydipsia, polyuria and visual disturbances. Last A1C in the office was:  Lab Results  Component Value Date   HGBA1C 5.5 11/18/2018   Patient is on Vitamin D supplement.   Lab Results  Component Value Date   VD25OH 110 (H) 07/28/2018      Past Medical History:  Diagnosis Date  . Adenomatous colon polyp   . Allergy   . BPH (benign prostatic hyperplasia)   . CHF (congestive heart failure) ([place])   . 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 Diabetes mellitus type 2, controlled ([place])   . FHx: heart disease 02/27/2018  . Glaucoma   . Gout   . 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 MI (myocardial infarction) ([place]) 09/26/2011  . NSTEMI (non-ST elevated myocardial infarction) ([place])  11/26/2013   2013   . PAF (paroxysmal atrial fibrillation) ([place])    s/p DC-CV in 6/13. Off coumadin due to ureteral bleed     Allergies  Allergen Reactions  . Losartan Anaphylaxis  . Crestor [Rosuvastatin]     Elevated LFT's  . Lorazepam     Pt is unsure of reaction   . Morphine And Related Other (See Comments)    Has no effect on pt      Current Outpatient Medications (Endocrine & Metabolic):  .  insulin NPH-regular Human (NOVOLIN 70/30) (70-30) 100 UNIT/ML injection, Take 20-30 units 2 x day or as directed (Patient taking differently: Inject 12-22 Units into the skin See admin instructions. Injects 22 units in the AM and 12 units in the PM.)  Current Outpatient Medications (Cardiovascular):  .  atorvastatin (LIPITOR) 80 MG tablet, Take 40 mg by mouth 3 (three) times a week.   Current Outpatient Medications (Analgesics):  .  allopurinol (ZYLOPRIM) 300 MG tablet, Take 1 tablet Daily for Gout Prevention .  aspirin EC 81 MG tablet, Take 81 mg by mouth daily.  Current Outpatient Medications (Hematological):  .  warfarin (COUMADIN) 5 MG tablet, Take 1 to 2 tablets Daily or as directed  Current Outpatient Medications (Other):  Marland Kitchen  Ascorbic Acid (VITAMIN C) 1000 MG tablet, Take 1,000 mg by mouth daily. .  betamethasone dipropionate (DIPROLENE) 0.05 % cream, Apply 1 application topically daily as needed (eczema).  .  Black Pepper-Turmeric (TURMERIC COMPLEX/BLACK PEPPER PO), Take 1,500 mg by mouth daily. Marland Kitchen  buPROPion (WELLBUTRIN XL) 300 MG 24 hr tablet, Take 1 tablet every Morning for Mood .  finasteride (PROSCAR) 5 MG tablet, Take 1 tablet Daily for Prostate .  glucose blood (FREESTYLE TEST STRIPS) test strip, Check blood sugar 3 to 4 times daily for medication regulation. Marland Kitchen  latanoprost (XALATAN) 0.005 % ophthalmic solution, Place 1 drop into both eyes at bedtime.  Marland Kitchen  MAGNESIUM-OXIDE 400 (241.3 Mg) MG tablet, Take 400 mg by mouth 2 (two) times daily.  .  mycophenolate (MYFORTIC) 360  MG TBEC EC tablet, TAKE 1 TABLET BY MOUTH TWICE DAILY (Patient taking differently: Take 360 mg by mouth 2 (two) times daily. ) .  Probiotic Product (PROBIOTIC DAILY PO), Take 1 capsule by mouth daily. .  tacrolimus (PROGRAF) 0.5 MG capsule, Take 0.5 mg by mouth 2 (two) times daily. .  tacrolimus (PROGRAF) 1 MG capsule, Take 1 mg by mouth 2 (two) times daily. .  traZODone (DESYREL) 50 MG tablet, Take 1 tablet (50 mg total) by mouth at bedtime as needed for sleep. Marland Kitchen  VITAMIN D PO, Take 5,000 Units by mouth. Takes two 5000 unit capsules on MWF and 1 capsule 4 days a week. .  Zinc 50 MG CAPS, Take 50 mg by mouth daily.  ROS: all negative except above.   Physical Exam:  BP 114/68   Pulse 79   Temp 97.6 F (36.4 C)   Wt 183 lb (83 kg)   SpO2 96%   BMI 28.24 kg/m   General Appearance: Well nourished, in no apparent distress. Eyes: PERRLA, EOMs, conjunctiva no swelling or erythema Sinuses: No Frontal/maxillary tenderness ENT/Mouth: Ext aud canals clear, TMs without erythema, bulging. No erythema, swelling, or exudate on post pharynx.  Tonsils not swollen or erythematous. Hearing normal.  Neck: Supple, thyroid normal.  Respiratory: Respiratory effort normal, BS equal bilaterally without rales, rhonchi, wheezing or stridor.  Cardio: Irregularly irregular, 3/6 early systolic blowing murmur. Brisk peripheral pulses, left TP delayed compared to right TP, without edema. Fistula to left wrist intact, slightly engorged without erythema, tenderness, discharge, or warmth. Good distal sensation and cap refill.  Abdomen: Soft, + BS.  Non tender, no guarding, rebound, hernias, masses. Lymphatics: Non tender without lymphadenopathy.  Musculoskeletal: Full ROM, 5/5 strength, normal gait.  Skin: left arm with well healing 5 cm area, no warmth, swelling, erythema. Warm, dry without rashes, lesions, ecchymosis.  Neuro: Cranial nerves intact. Normal muscle tone, no cerebellar symptoms.  Psych: Awake and  oriented X 3, normal affect, Insight and Judgment appropriate.     Vicie Mutters, PA-C 8:55 AM [facility]

## 2019-03-01 ENCOUNTER — Encounter: Payer: Self-pay | Admitting: Physician Assistant

## 2019-03-01 ENCOUNTER — Other Ambulatory Visit: Payer: Self-pay

## 2019-03-01 ENCOUNTER — Ambulatory Visit (INDEPENDENT_AMBULATORY_CARE_PROVIDER_SITE_OTHER): Payer: Medicare Other | Admitting: Physician Assistant

## 2019-03-01 VITALS — BP 114/68 | HR 79 | Temp 97.6°F | Wt 183.0 lb

## 2019-03-01 DIAGNOSIS — R197 Diarrhea, unspecified: Secondary | ICD-10-CM

## 2019-03-01 DIAGNOSIS — N182 Chronic kidney disease, stage 2 (mild): Secondary | ICD-10-CM

## 2019-03-01 DIAGNOSIS — E559 Vitamin D deficiency, unspecified: Secondary | ICD-10-CM

## 2019-03-01 DIAGNOSIS — E1122 Type 2 diabetes mellitus with diabetic chronic kidney disease: Secondary | ICD-10-CM

## 2019-03-01 DIAGNOSIS — D692 Other nonthrombocytopenic purpura: Secondary | ICD-10-CM

## 2019-03-01 DIAGNOSIS — I5022 Chronic systolic (congestive) heart failure: Secondary | ICD-10-CM

## 2019-03-01 DIAGNOSIS — F325 Major depressive disorder, single episode, in full remission: Secondary | ICD-10-CM

## 2019-03-01 DIAGNOSIS — E785 Hyperlipidemia, unspecified: Secondary | ICD-10-CM

## 2019-03-01 DIAGNOSIS — I2510 Atherosclerotic heart disease of native coronary artery without angina pectoris: Secondary | ICD-10-CM

## 2019-03-01 DIAGNOSIS — E1169 Type 2 diabetes mellitus with other specified complication: Secondary | ICD-10-CM

## 2019-03-01 DIAGNOSIS — I480 Paroxysmal atrial fibrillation: Secondary | ICD-10-CM

## 2019-03-01 DIAGNOSIS — D6859 Other primary thrombophilia: Secondary | ICD-10-CM

## 2019-03-01 DIAGNOSIS — Z794 Long term (current) use of insulin: Secondary | ICD-10-CM

## 2019-03-01 DIAGNOSIS — D696 Thrombocytopenia, unspecified: Secondary | ICD-10-CM

## 2019-03-01 DIAGNOSIS — Z79899 Other long term (current) drug therapy: Secondary | ICD-10-CM

## 2019-03-01 NOTE — Patient Instructions (Addendum)
Please go to the ER if you have any severe AB pain, unable to hold down food/water, blood in stool or vomit, chest pain, shortness of breath, or any worsening symptoms.  He was informed to call 911 if he develop any new symptoms such as worsening headaches, episodes of blurred vision, double vision or complete loss of vision or speech difficulties or motor weakness.   Wound care to do every day . Take off old bandage . Rinse off leg thoroughly in shower, wipe off any excess thick yellow discharge until you see a clean wound with some gauze or soft washcloth . Mix up betadine slurry: Combine sugar and betadine in a container that you can seal and keep; mix ingredients to create a paste the consistency of peanut butter . Apply betadine and sugar mixture to wound to cover all edges, apply non-adherent dressing pads, then an absorbent dressing (gauze or ABD) to catch any excess leaking, then wrap with ace, gauze wrapping and or tape to hold dressing in place.  . Remember betadine will stain clothing and sheets - make sure to cover/protect any furniture or linens - you can put down absorbent chucks pad or trash bags   Food Choices to Help Relieve Diarrhea, Adult When you have diarrhea, the foods you eat and your eating habits are very important. Choosing the right foods and drinks can help:  Relieve diarrhea.  Replace lost fluids and nutrients.  Prevent dehydration. What general guidelines should I follow?  Relieving diarrhea  Choose foods with less than 2 g or .07 oz. of fiber per serving.  Limit fats to less than 8 tsp (38 g or 1.34 oz.) a day.  Avoid the following: ? Foods and beverages sweetened with high-fructose corn syrup, honey, or sugar alcohols such as xylitol, sorbitol, and mannitol. ? Foods that contain a lot of fat or sugar. ? Fried, greasy, or spicy foods. ? High-fiber grains, breads, and cereals. ? Raw fruits and vegetables.  Eat foods that are rich in probiotics. These  foods include dairy products such as yogurt and fermented milk products. They help increase healthy bacteria in the stomach and intestines (gastrointestinal tract, or GI tract).  If you have lactose intolerance, avoid dairy products. These may make your diarrhea worse.  Take medicine to help stop diarrhea (antidiarrheal medicine) only as told by your health care provider. Replacing nutrients  Eat small meals or snacks every 3-4 hours.  Eat bland foods, such as white rice, toast, or baked potato, until your diarrhea starts to get better. Gradually reintroduce nutrient-rich foods as tolerated or as told by your health care provider. This includes: ? Well-cooked protein foods. ? Peeled, seeded, and soft-cooked fruits and vegetables. ? Low-fat dairy products.  Take vitamin and mineral supplements as told by your health care provider. Preventing dehydration  Start by sipping water or a special solution to prevent dehydration (oral rehydration solution, ORS). Urine that is clear or pale yellow means that you are getting enough fluid.  Try to drink at least 8-10 cups of fluid each day to help replace lost fluids.  You may add other liquids in addition to water, such as clear juice or decaffeinated sports drinks, as tolerated or as told by your health care provider.  Avoid drinks with caffeine, such as coffee, tea, or soft drinks.  Avoid alcohol. What foods are recommended?     The items listed may not be a complete list. Talk with your health care provider about what dietary choices are  best for you. Grains White rice. White, [place], or pita breads (fresh or toasted), including plain rolls, buns, or bagels. White pasta. Saltine, soda, or graham crackers. Pretzels. Low-fiber cereal. Cooked cereals made with water (such as cornmeal, farina, or cream cereals). Plain muffins. Matzo. Melba toast. Zwieback. Vegetables Potatoes (without the skin). Most well-cooked and canned vegetables without  skins or seeds. Tender lettuce. Fruits Apple sauce. Fruits canned in juice. Cooked apricots, cherries, grapefruit, peaches, pears, or plums. Fresh bananas and cantaloupe. Meats and other protein foods Baked or boiled chicken. Eggs. Tofu. Fish. Seafood. Smooth nut butters. Ground or well-cooked tender beef, ham, veal, lamb, pork, or poultry. Dairy Plain yogurt, kefir, and unsweetened liquid yogurt. Lactose-free milk, buttermilk, skim milk, or soy milk. Low-fat or nonfat hard cheese. Beverages Water. Low-calorie sports drinks. Fruit juices without pulp. Strained tomato and vegetable juices. Decaffeinated teas. Sugar-free beverages not sweetened with sugar alcohols. Oral rehydration solutions, if approved by your health care provider. Seasoning and other foods Bouillon, broth, or soups made from recommended foods. What foods are not recommended? The items listed may not be a complete list. Talk with your health care provider about what dietary choices are best for you. Grains Whole grain, whole wheat, bran, or rye breads, rolls, pastas, and crackers. Wild or brown rice. Whole grain or bran cereals. Barley. Oats and oatmeal. Corn tortillas or taco shells. Granola. Popcorn. Vegetables Raw vegetables. Fried vegetables. Cabbage, broccoli, Brussels sprouts, artichokes, baked beans, beet greens, corn, kale, legumes, peas, sweet potatoes, and yams. Potato skins. Cooked spinach and cabbage. Fruits Dried fruit, including raisins and dates. Raw fruits. Stewed or dried prunes. Canned fruits with syrup. Meat and other protein foods Fried or fatty meats. Deli meats. Chunky nut butters. Nuts and seeds. Beans and lentils. Robert Arnold. Hot dogs. Sausage. Dairy High-fat cheeses. Whole milk, chocolate milk, and beverages made with milk, such as milk shakes. Half-and-half. Cream. sour cream. Ice cream. Beverages Caffeinated beverages (such as coffee, tea, soda, or energy drinks). Alcoholic beverages. Fruit juices with  pulp. Prune juice. Soft drinks sweetened with high-fructose corn syrup or sugar alcohols. High-calorie sports drinks. Fats and oils Butter. Cream sauces. Margarine. Salad oils. Plain salad dressings. Olives. Avocados. Mayonnaise. Sweets and desserts Sweet rolls, doughnuts, and sweet breads. Sugar-free desserts sweetened with sugar alcohols such as xylitol and sorbitol. Seasoning and other foods Honey. Hot sauce. Chili powder. Gravy. Cream-based or milk-based soups. Pancakes and waffles. Summary  When you have diarrhea, the foods you eat and your eating habits are very important.  Make sure you get at least 8-10 cups of fluid each day, or enough to keep your urine clear or pale yellow.  Eat bland foods and gradually reintroduce healthy, nutrient-rich foods as tolerated, or as told by your health care provider.  Avoid high-fiber, fried, greasy, or spicy foods. This information is not intended to replace advice given to you by your health care provider. Make sure you discuss any questions you have with your health care provider. Document Released: 06/15/2003 Document Revised: 07/16/2018 Document Reviewed: 03/22/2016 Elsevier Patient Education  [place].   Tinnitus Tinnitus refers to hearing a sound when there is no actual source for that sound. This is often described as ringing in the ears. However, people with this condition may hear a variety of noises, in one ear or in both ears. The sounds of tinnitus can be soft, loud, or somewhere in between. Tinnitus can last for a few seconds or can be constant for days. It may go  away without treatment and come back at various times. When tinnitus is constant or happens often, it can lead to other problems, such as trouble sleeping and trouble concentrating. Almost everyone experiences tinnitus at some point. Tinnitus that is long-lasting (chronic) or comes back often (recurs) may require medical attention. What are the causes? The cause of  tinnitus is often not known. In some cases, it can result from other problems or conditions, including:  Exposure to loud noises from machinery, music, or other sources.  Hearing loss.  Ear or sinus infections.  Earwax buildup.  An object (foreign body) stuck in the ear.  Taking certain medicines.  Drinking alcohol or caffeine.  High blood pressure.  Heart diseases.  Anemia.  Allergies.  Meniere's disease.  Thyroid problems.  Tumors.  A weak, bulging blood vessel (aneurysm) near the ear.  Depression or other mood disorders. What are the signs or symptoms? The main symptom of tinnitus is hearing a sound when there is no source for that sound. It may sound like:  Buzzing.  Roaring.  Ringing.  Blowing air, like the sound heard when you listen to a seashell.  Hissing.  Whistling.  Sizzling.  Humming.  Running water.  A musical note.  Tapping. Symptoms may affect only one ear (unilateral) or both ears (bilateral). How is this diagnosed? Tinnitus is diagnosed based on your symptoms, your medical history, and a physical exam. Your health care provider may do a thorough hearing test (audiologic exam) if your tinnitus:  Is unilateral.  Causes hearing difficulties.  Lasts 6 months or longer. You may work with a health care provider who specializes in hearing disorders (audiologist). You may be asked questions about your symptoms and how they affect your daily life. You may have other tests done, such as:  CT scan.  MRI.  An imaging test of how blood flows through your blood vessels (angiogram). How is this treated? Treating an underlying medical condition can sometimes make tinnitus go away. If your tinnitus continues, other treatments may include:  Medicines, such as antidepressants or sleeping aids.  Sound generators to mask the tinnitus. These include: ? Tabletop sound machines that play relaxing sounds to help you fall asleep. ? Wearable  devices that fit in your ear and play sounds or music. ? Acoustic neural stimulation. This involves using headphones to listen to music that contains an auditory signal. Over time, listening to this signal may change some pathways in your brain and make you less sensitive to tinnitus. This treatment is used for very severe cases when no other treatment is working.  Therapy and counseling to help you manage the stress of living with tinnitus.  Using hearing aids or cochlear implants if your tinnitus is related to hearing loss. Hearing aids are worn in the outer ear. Cochlear implants are surgically placed in the inner ear. Follow these instructions at home: Managing symptoms      When possible, avoid being in loud places and being exposed to loud sounds.  Wear hearing protection, such as earplugs, when you are exposed to loud noises.  Use a white noise machine, a humidifier, or other devices to mask the sound of tinnitus.  Practice techniques for reducing stress, such as meditation, yoga, or deep breathing. Work with your health care provider if you need help with managing stress.  Sleep with your head slightly raised. This may reduce the impact of tinnitus. General instructions  Do not use stimulants, such as nicotine, alcohol, or caffeine. Talk with  your health care provider about other stimulants to avoid. Stimulants are substances that can make you feel alert and attentive by increasing certain activities in the body (such as heart rate and blood pressure). These substances may make tinnitus worse.  Take over-the-counter and prescription medicines only as told by your health care provider.  Try to get plenty of sleep each night.  Keep all follow-up visits as told by your health care provider. This is important. Contact a health care provider if:  Your tinnitus continues for 3 weeks or longer without stopping.  Your symptoms get worse or do not get better with home care.  You  develop tinnitus after a head injury.  You have tinnitus along with any of the following: ? Dizziness. ? Loss of balance. ? Nausea and vomiting. Summary  Tinnitus refers to hearing a sound when there is no actual source for that sound. This is often described as ringing in the ears.  Symptoms may affect only one ear (unilateral) or both ears (bilateral).  Use a white noise machine, a humidifier, or other devices to mask the sound of tinnitus.  Do not use stimulants, such as nicotine, alcohol, or caffeine. Talk with your health care provider about other stimulants to avoid. These substances may make tinnitus worse. This information is not intended to replace advice given to you by your health care provider. Make sure you discuss any questions you have with your health care provider. Document Released: 03/25/2005 Document Revised: 03/07/2017 Document Reviewed: 01/02/2017 Elsevier Patient Education  2020 [employer].

## 2019-03-03 LAB — CBC WITH DIFFERENTIAL/PLATELET
Absolute Monocytes: 437 cells/uL (ref 200–950)
Basophils Absolute: 48 cells/uL (ref 0–200)
Basophils Relative: 1 %
Eosinophils Absolute: 82 cells/uL (ref 15–500)
Eosinophils Relative: 1.7 %
HCT: 44.5 % (ref 38.5–50.0)
Hemoglobin: 15.3 g/dL (ref 13.2–17.1)
Lymphs Abs: 1171 cells/uL (ref 850–3900)
MCH: 32 pg (ref 27.0–33.0)
MCHC: 34.4 g/dL (ref 32.0–36.0)
MCV: 93.1 fL (ref 80.0–100.0)
MPV: 9.8 fL (ref 7.5–12.5)
Monocytes Relative: 9.1 %
Neutro Abs: 3062 cells/uL (ref 1500–7800)
Neutrophils Relative %: 63.8 %
Platelets: 143 10*3/uL (ref 140–400)
RBC: 4.78 10*6/uL (ref 4.20–5.80)
RDW: 11.9 % (ref 11.0–15.0)
Total Lymphocyte: 24.4 %
WBC: 4.8 10*3/uL (ref 3.8–10.8)

## 2019-03-03 LAB — COMPLETE METABOLIC PANEL WITH GFR
AG Ratio: 1.7 (calc) (ref 1.0–2.5)
ALT: 13 U/L (ref 9–46)
AST: 19 U/L (ref 10–35)
Albumin: 4 g/dL (ref 3.6–5.1)
Alkaline phosphatase (APISO): 99 U/L (ref 35–144)
BUN: 17 mg/dL (ref 7–25)
CO2: 26 mmol/L (ref 20–32)
Calcium: 9.8 mg/dL (ref 8.6–10.3)
Chloride: 103 mmol/L (ref 98–110)
Creat: 1.01 mg/dL (ref 0.70–1.18)
GFR, Est African American: 87 mL/min/{1.73_m2} (ref >=60)
GFR, Est Non African American: 75 mL/min/{1.73_m2} (ref >=60)
Globulin: 2.3 g/dL (calc) (ref 1.9–3.7)
Glucose, Bld: 105 mg/dL — ABNORMAL HIGH (ref 65–99)
Potassium: 4.3 mmol/L (ref 3.5–5.3)
Sodium: 139 mmol/L (ref 135–146)
Total Bilirubin: 1.2 mg/dL (ref 0.2–1.2)
Total Protein: 6.3 g/dL (ref 6.1–8.1)

## 2019-03-03 LAB — TEST AUTHORIZATION

## 2019-03-03 LAB — LIPID PANEL
Cholesterol: 148 mg/dL (ref <200)
HDL: 66 mg/dL (ref >=40)
LDL Cholesterol (Calc): 66 mg/dL (calc)
Non-HDL Cholesterol (Calc): 82 mg/dL (calc) (ref <130)
Total CHOL/HDL Ratio: 2.2 (calc) (ref <5.0)
Triglycerides: 79 mg/dL (ref <150)

## 2019-03-03 LAB — HEMOGLOBIN A1C W/OUT EAG: Hgb A1c MFr Bld: 5.4 % of total Hgb (ref <5.7)

## 2019-03-03 LAB — VITAMIN D 25 HYDROXY (VIT D DEFICIENCY, FRACTURES): Vit D, 25-Hydroxy: 94 ng/mL (ref 30–100)

## 2019-03-03 LAB — TSH: TSH: 2.05 mIU/L (ref 0.40–4.50)

## 2019-03-03 LAB — PROTIME-INR
INR: 2.1 — ABNORMAL HIGH
Prothrombin Time: 21.2 s — ABNORMAL HIGH (ref 9.0–11.5)

## 2019-03-03 LAB — MAGNESIUM: Magnesium: 1.8 mg/dL (ref 1.5–2.5)

## 2019-03-17 ENCOUNTER — Encounter: Payer: Self-pay | Admitting: Internal Medicine

## 2019-03-17 ENCOUNTER — Other Ambulatory Visit: Payer: Self-pay | Admitting: Internal Medicine

## 2019-03-17 DIAGNOSIS — E1169 Type 2 diabetes mellitus with other specified complication: Secondary | ICD-10-CM

## 2019-03-17 MED ORDER — ATORVASTATIN CALCIUM 40 MG PO TABS: ORAL_TABLET | ORAL | 3 refills | Status: DC

## 2019-04-04 ENCOUNTER — Encounter: Payer: Self-pay | Admitting: Internal Medicine

## 2019-04-04 NOTE — Progress Notes (Signed)
Comprehensive Evaluation & Examination     This very nice 73 y.o. MWM presents for a comprehensive evaluation and management of multiple medical co-morbidities.  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 Patient has Gout controlled w/Allopurinol. He also has hx/o Depression in remission on his meds.      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 Patient is on Prograf / Myfortic followed by Dr Erling Cruz. After his Renal Transplant in June 2013, he had an acute MI and PCA/Stent. In Nov 2013, he underwent CABG& had po Afib requiring CV. He remains on Coumadin for CHADsVASc 5.      HTN predates since 1998. Patient's BP has been controlled at home.  Today's BP is at goal - 124/72. Patient denies any cardiac symptoms as chest pain, palpitations, shortness of breath, dizziness or ankle swelling.     Patient's hyperlipidemia is controlled with diet and Atorvastatin. Patient denies myalgias or other medication SE's. Last lipids were at goal:  Lab Results  Component Value Date   CHOL 148 03/01/2019   HDL 66 03/01/2019   LDLCALC 66 03/01/2019   TRIG 79 03/01/2019   CHOLHDL 2.2 03/01/2019      Patient has hx/o T2_IDDM (2003) w/stable CKD (GFR 75). Patient was initially managed with diet til starting Metformin in 2007 and then transitioned to Insulin after his Renal transplant in 2013.  Patient denies reactive hypoglycemic symptoms, visual blurring, diabetic polys or paresthesias. Last A1c was at goal:  Lab Results  Component Value Date   HGBA1C 5.4 03/01/2019       Finally, patient has history of Vitamin D Deficiency ("24" / 2014)  and last vitamin D was at goal:  Lab Results  Component Value Date   VD25OH 94 03/01/2019   Current Outpatient Medications on File Prior to Visit  Medication Sig  . allopurinol (ZYLOPRIM) 300 MG tablet Take 1 tablet Daily for Gout  Prevention  . aspirin EC 81 MG tablet Take 81 mg by mouth daily.  Marland Kitchen atorvastatin (LIPITOR) 40 MG tablet Take 1 tablet  Daily for Cholesterol  . betamethasone dipropionate (DIPROLENE) 0.05 % cream Apply 1 application topically daily as needed (eczema).   . Black Pepper-Turmeric (TURMERIC COMPLEX/BLACK PEPPER PO) Take 1,500 mg by mouth daily.  Marland Kitchen buPROPion (WELLBUTRIN XL) 300 MG 24 hr tablet Take 1 tablet every Morning for Mood  . finasteride (PROSCAR) 5 MG tablet Take 1 tablet Daily for Prostate  . glucose blood (FREESTYLE TEST STRIPS) test strip Check blood sugar 3 to 4 times daily for medication regulation.  . insulin NPH-regular Human (NOVOLIN 70/30) (70-30) 100 UNIT/ML injection Take 20-30 units 2 x day or as directed (Patient taking differently: Inject 12-22 Units into the skin See admin instructions. Injects 22 units in the AM and 12 units in the PM.)  . latanoprost (XALATAN) 0.005 % ophthalmic solution Place 1 drop into both eyes at bedtime.   Marland Kitchen MAGNESIUM-OXIDE 400 (241.3 Mg) MG tablet Take 400 mg by mouth 2 (two) times daily.   . mycophenolate (MYFORTIC) 360 MG TBEC EC tablet TAKE 1 TABLET BY MOUTH TWICE DAILY (Patient taking differently: Take 360 mg by mouth 2 (two) times daily. )  . Probiotic Product (PROBIOTIC DAILY PO) Take 1 capsule by mouth daily.  . tacrolimus (PROGRAF) 0.5 MG capsule Take 0.5 mg by mouth 2 (two) times daily.  . tacrolimus (PROGRAF) 1  MG capsule Take 1 mg by mouth 2 (two) times daily.  . traZODone (DESYREL) 50 MG tablet Take 1 tablet (50 mg total) by mouth at bedtime as needed for sleep.  Marland Kitchen VITAMIN D PO Take 5,000 Units by mouth. Takes two 5000 unit capsules on MWF and 1 capsule 4 days a week.  . warfarin (COUMADIN) 5 MG tablet Take 1 to 2 tablets Daily or as directed  . Zinc 50 MG CAPS Take 50 mg by mouth daily.  . Ascorbic Acid (VITAMIN C) 1000 MG tablet Take 1,000 mg by mouth daily.   No current facility-administered medications on file prior to visit.    Allergies  Allergen Reactions  . Losartan Anaphylaxis  . Crestor [Rosuvastatin]     Elevated LFT's  . Lorazepam     Pt is unsure of reaction   . Morphine And Related Other (See Comments)    Has no effect on pt    Past Medical History:  Diagnosis Date  . Adenomatous colon polyp   . Allergy   . BPH (benign prostatic hyperplasia)   . CHF (congestive heart failure) ([place])   . 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 Diabetes mellitus type 2, controlled ([place])   . FHx: heart disease 02/27/2018  . Glaucoma   . Gout   . 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 MI (myocardial infarction) ([place]) 09/26/2011  . NSTEMI (non-ST elevated myocardial infarction) ([place]) 11/26/2013   2013   . PAF (paroxysmal atrial fibrillation) ([place])    s/p DC-CV in 6/13. Off coumadin due to ureteral bleed   Health Maintenance  Topic Date Due  . OPHTHALMOLOGY EXAM  12/12/2018  . URINE MICROALBUMIN  03/05/2019  . HEMOGLOBIN A1C  08/29/2019  . FOOT EXAM  04/03/2020  . COLONOSCOPY  03/13/2021  . TETANUS/TDAP  06/04/2025  . INFLUENZA VACCINE  Completed  . Hepatitis C Screening  Completed  . PNA vac Low Risk Adult  Completed   Immunization History  Administered Date(s) Administered  . DT 06/05/2015  . Hepatitis B 04/08/2009  . Influenza, High Dose Seasonal PF 12/27/2013, 01/17/2015, 01/11/2016, 01/02/2018, 12/21/2018  . Influenza-Unspecified 12/25/2016  . Pneumococcal Conjugate-13 10/25/2013  . Pneumococcal Polysaccharide-23 06/05/2015  . Pneumococcal-Unspecified 04/08/2008  . Tdap 04/08/2008  . Zoster Recombinat (Shingrix) 02/12/2018, 06/02/2018   Last Colon - 03/13/2016 - Dr Havery Moros - recc 5 yr f/u due Dec 2022  Past Surgical History:  Procedure Laterality Date  . ANTERIOR CERVICAL DECOMP/DISCECTOMY FUSION N/A 05/04/2018   Procedure: Cervical Five-Six Anterior cervical decompression/discectomy/fusion;  Surgeon: Jovita Gamma, MD;   Location: [facility];  Service: Neurosurgery;  Laterality: N/A;  Cervical Five-Six Anterior cervical decompression/discectomy/fusion  . AV FISTULA PLACEMENT  2011   Left forearm  . COLONOSCOPY    . CORONARY ANGIOPLASTY WITH STENT PLACEMENT    . CORONARY ARTERY BYPASS GRAFT  2013  . KIDNEY TRANSPLANT  09/25/2011  . PERITONEAL CATHETER INSERTION  2011   Family History  Problem Relation Age of Onset  . Heart attack Father   . Diabetes Mother   . COPD Mother   . Kidney disease Sister   . Colon cancer Neg Hx   . Stomach cancer Neg Hx   . Rectal cancer Neg Hx   . Esophageal cancer Neg Hx   . Liver cancer Neg Hx    Social History   Socioeconomic History  . Marital status: Married    Spouse name: Not  on file  . Number of children: 1  Occupational History  . Occupation: retired  Tobacco Use  . Smoking status: Former Smoker    Quit date: 11/29/1990    Years since quitting: 28.3  . Smokeless tobacco: Never Used  Substance and Sexual Activity  . Alcohol use: Yes    Comment: 1-2 a month  . Drug use: No  . Sexual activity: Not on file    ROS Constitutional: Denies fever, chills, weight loss/gain, headaches, insomnia,  night sweats or change in appetite. Does c/o fatigue. Eyes: Denies redness, blurred vision, diplopia, discharge, itchy or watery eyes.  ENT: Denies discharge, congestion, post nasal drip, epistaxis, sore throat, earache, hearing loss, dental pain, Tinnitus, Vertigo, Sinus pain or snoring.  Cardio: Denies chest pain, palpitations, irregular heartbeat, syncope, dyspnea, diaphoresis, orthopnea, PND, claudication or edema Respiratory: denies cough, dyspnea, DOE, pleurisy, hoarseness, laryngitis or wheezing.  Gastrointestinal: Denies dysphagia, heartburn, reflux, water brash, pain, cramps, nausea, vomiting, bloating, diarrhea, constipation, hematemesis, melena, hematochezia, jaundice or hemorrhoids Genitourinary: Denies dysuria, frequency,  discharge, hematuria or flank pain. Has  urgency, nocturia x 2-3 & occasional hesitancy. Musculoskeletal: Denies arthralgia, myalgia, stiffness, Jt. Swelling, pain, limp or strain/sprain. Denies Falls. Skin: Denies puritis, rash, hives, warts, acne, eczema or change in skin lesion Neuro: No weakness, tremor, incoordination, spasms, paresthesia or pain Psychiatric: Denies confusion, memory loss or sensory loss. Denies Depression. Endocrine: Denies change in weight, skin, hair change, nocturia, and paresthesia, diabetic polys, visual blurring or hyper / hypo glycemic episodes.  Heme/Lymph: No excessive bleeding, bruising or enlarged lymph nodes.  Physical Exam  BP 124/72   Pulse 68   Temp (!) 97.2 F (36.2 C)   Resp 16   Ht 5\' 8"  (1.727 m)   Wt 184 lb (83.5 kg)   BMI 27.98 kg/m   General Appearance: Well nourished and well groomed and in no apparent distress.  Eyes: PERRLA, EOMs, conjunctiva no swelling or erythema, normal fundi and vessels. Sinuses: No frontal/maxillary tenderness ENT/Mouth: EACs patent / TMs  nl. Nares clear without erythema, swelling, mucoid exudates. Oral hygiene is good. No erythema, swelling, or exudate. Tongue normal, non-obstructing. Tonsils not swollen or erythematous. Hearing normal.  Neck: Supple, thyroid not palpable. No bruits, nodes or JVD. Respiratory: Respiratory effort normal.  BS equal and clear bilateral without rales, rhonci, wheezing or stridor. Cardio: Heart sounds are normal with regular rate and rhythm and no murmurs, rubs or gallops. Peripheral pulses are normal and equal bilaterally without edema. No aortic or femoral bruits. Chest: symmetric, barrel chested with increased AP diameter with normal excursions. Median Sternotomy scar.  Abdomen: Soft, with Nl bowel sounds. Nontender, no guarding, rebound, hernias, masses, or organomegaly.  Lymphatics: Non tender without lymphadenopathy.  Musculoskeletal: Full ROM all peripheral extremities, joint stability, 5/5 strength, and normal  gait. Skin: Warm and dry without rashes, lesions, cyanosis, clubbing or  ecchymosis.  Neuro: Cranial nerves intact, reflexes equal bilaterally. Normal muscle tone, no cerebellar symptoms.Sensation intact to touch, vibratory and Monofilament to the toes bilaterally.  Pysch: Alert and oriented X 3 with normal affect, insight and judgment appropriate.   Assessment and Plan  1. Essential hypertension  - EKG 12-Lead - [place], retroperitnl abd,  ltd - Urinalysis, Routine w reflex microscopic - Microalbumin / Creatinine Urine Ratio - CBC with Diff - COMPLETE METABOLIC PANEL WITH GFR  2. Hyperlipidemia associated with type 2 diabetes mellitus ([place])  - EKG 12-Lead - [place], retroperitnl abd,  ltd  3. Type 2 diabetes mellitus with stage  2 chronic kidney disease,  with long-term current use of insulin (HCC)  - EKG 12-Lead - [place], retroperitnl abd,  ltd - Urinalysis, Routine w reflex microscopic - Microalbumin / Creatinine Urine Ratio - HM DIABETES FOOT EXAM - LOW EXTREMITY NEUR EXAM DOCUM - COMPLETE METABOLIC PANEL WITH GFR  4. Vitamin D deficiency  5. PAF (paroxysmal atrial fibrillation) (HCC)  - EKG 12-Lead - PT (Prothrombin Time)  6. Thrombophilia ([place])  - PT (Prothrombin Time)  7. 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 OSA (obstructive sleep apnea)  9. BPH with obstruction/lower urinary tract symptoms  - Urinalysis, Routine w reflex microscopic - PSA  10. Idiopathic gout  - Uric acid  11. Prostate cancer screening  - PSA  12. Screening for colorectal cancer  - POC Hemoccult Bld/Stl   13. Screening for ischemic heart disease  - EKG 12-Lead  14. FHx: heart disease  - EKG 12-Lead - [place], retroperitnl abd,  ltd  15. Former smoker  - EKG 12-Lead - [place], retroperitnl abd,  ltd  16. Screening for AAA (aortic abdominal aneurysm)  - [place], retroperitnl abd,  ltd  17.  Anticoagulant long-term use  - PT (Prothrombin Time)  18. Medication management  - Urinalysis, Routine w reflex microscopic - Microalbumin / Creatinine Urine Ratio - Uric acid - CBC with Diff - COMPLETE METABOLIC PANEL WITH GFR            Patient was counseled in prudent diet, weight control to achieve/maintain BMI less than 25, BP monitoring, regular exercise and medications as discussed.  Discussed med effects and SE's. Routine screening labs and tests as requested with regular follow-up as recommended. Over 40 minutes of exam, counseling, chart review and high complex critical decision making was performed   Kirtland Bouchard, MD

## 2019-04-04 NOTE — Patient Instructions (Signed)

## 2019-04-05 ENCOUNTER — Ambulatory Visit (INDEPENDENT_AMBULATORY_CARE_PROVIDER_SITE_OTHER): Payer: Medicare Other | Admitting: Internal Medicine

## 2019-04-05 ENCOUNTER — Other Ambulatory Visit: Payer: Self-pay

## 2019-04-05 VITALS — BP 124/72 | HR 68 | Temp 97.2°F | Resp 16 | Ht 68.0 in | Wt 184.0 lb

## 2019-04-05 DIAGNOSIS — E785 Hyperlipidemia, unspecified: Secondary | ICD-10-CM

## 2019-04-05 DIAGNOSIS — I10 Essential (primary) hypertension: Secondary | ICD-10-CM

## 2019-04-05 DIAGNOSIS — Z7901 Long term (current) use of anticoagulants: Secondary | ICD-10-CM

## 2019-04-05 DIAGNOSIS — Z794 Long term (current) use of insulin: Secondary | ICD-10-CM

## 2019-04-05 DIAGNOSIS — N138 Other obstructive and reflux uropathy: Secondary | ICD-10-CM

## 2019-04-05 DIAGNOSIS — Z8249 Family history of ischemic heart disease and other diseases of the circulatory system: Secondary | ICD-10-CM | POA: Diagnosis not present

## 2019-04-05 DIAGNOSIS — D6859 Other primary thrombophilia: Secondary | ICD-10-CM

## 2019-04-05 DIAGNOSIS — E559 Vitamin D deficiency, unspecified: Secondary | ICD-10-CM

## 2019-04-05 DIAGNOSIS — I25708 Atherosclerosis of coronary artery bypass graft(s), unspecified, with other forms of angina pectoris: Secondary | ICD-10-CM | POA: Diagnosis not present

## 2019-04-05 DIAGNOSIS — Z1211 Encounter for screening for malignant neoplasm of colon: Secondary | ICD-10-CM

## 2019-04-05 DIAGNOSIS — E1169 Type 2 diabetes mellitus with other specified complication: Secondary | ICD-10-CM

## 2019-04-05 DIAGNOSIS — M1000 Idiopathic gout, unspecified site: Secondary | ICD-10-CM

## 2019-04-05 DIAGNOSIS — Z136 Encounter for screening for cardiovascular disorders: Secondary | ICD-10-CM | POA: Diagnosis not present

## 2019-04-05 DIAGNOSIS — Z87891 Personal history of nicotine dependence: Secondary | ICD-10-CM | POA: Diagnosis not present

## 2019-04-05 DIAGNOSIS — G4733 Obstructive sleep apnea (adult) (pediatric): Secondary | ICD-10-CM

## 2019-04-05 DIAGNOSIS — N182 Chronic kidney disease, stage 2 (mild): Secondary | ICD-10-CM

## 2019-04-05 DIAGNOSIS — Z125 Encounter for screening for malignant neoplasm of prostate: Secondary | ICD-10-CM

## 2019-04-05 DIAGNOSIS — E1122 Type 2 diabetes mellitus with diabetic chronic kidney disease: Secondary | ICD-10-CM

## 2019-04-05 DIAGNOSIS — I480 Paroxysmal atrial fibrillation: Secondary | ICD-10-CM

## 2019-04-05 DIAGNOSIS — Z79899 Other long term (current) drug therapy: Secondary | ICD-10-CM

## 2019-04-06 LAB — CBC WITH DIFFERENTIAL/PLATELET
Absolute Monocytes: 412 cells/uL (ref 200–950)
Basophils Absolute: 49 cells/uL (ref 0–200)
Basophils Relative: 1 %
Eosinophils Absolute: 69 cells/uL (ref 15–500)
Eosinophils Relative: 1.4 %
HCT: 44 % (ref 38.5–50.0)
Hemoglobin: 15.1 g/dL (ref 13.2–17.1)
Lymphs Abs: 1078 cells/uL (ref 850–3900)
MCH: 31.8 pg (ref 27.0–33.0)
MCHC: 34.3 g/dL (ref 32.0–36.0)
MCV: 92.6 fL (ref 80.0–100.0)
MPV: 9.9 fL (ref 7.5–12.5)
Monocytes Relative: 8.4 %
Neutro Abs: 3293 cells/uL (ref 1500–7800)
Neutrophils Relative %: 67.2 %
Platelets: 136 10*3/uL — ABNORMAL LOW (ref 140–400)
RBC: 4.75 10*6/uL (ref 4.20–5.80)
RDW: 12.1 % (ref 11.0–15.0)
Total Lymphocyte: 22 %
WBC: 4.9 10*3/uL (ref 3.8–10.8)

## 2019-04-06 LAB — COMPLETE METABOLIC PANEL WITH GFR
AG Ratio: 1.8 (calc) (ref 1.0–2.5)
ALT: 19 U/L (ref 9–46)
AST: 22 U/L (ref 10–35)
Albumin: 4 g/dL (ref 3.6–5.1)
Alkaline phosphatase (APISO): 99 U/L (ref 35–144)
BUN: 22 mg/dL (ref 7–25)
CO2: 25 mmol/L (ref 20–32)
Calcium: 9.6 mg/dL (ref 8.6–10.3)
Chloride: 105 mmol/L (ref 98–110)
Creat: 1.16 mg/dL (ref 0.70–1.18)
GFR, Est African American: 73 mL/min/{1.73_m2} (ref >=60)
GFR, Est Non African American: 63 mL/min/{1.73_m2} (ref >=60)
Globulin: 2.2 g/dL (calc) (ref 1.9–3.7)
Glucose, Bld: 110 mg/dL — ABNORMAL HIGH (ref 65–99)
Potassium: 4.4 mmol/L (ref 3.5–5.3)
Sodium: 140 mmol/L (ref 135–146)
Total Bilirubin: 1.6 mg/dL — ABNORMAL HIGH (ref 0.2–1.2)
Total Protein: 6.2 g/dL (ref 6.1–8.1)

## 2019-04-06 LAB — URINALYSIS, ROUTINE W REFLEX MICROSCOPIC
Bilirubin Urine: NEGATIVE
Glucose, UA: NEGATIVE
Hgb urine dipstick: NEGATIVE
Ketones, ur: NEGATIVE
Leukocytes,Ua: NEGATIVE
Nitrite: NEGATIVE
Protein, ur: NEGATIVE
Specific Gravity, Urine: 1.017 (ref 1.001–1.03)
pH: 6 (ref 5.0–8.0)

## 2019-04-06 LAB — PROTIME-INR
INR: 1.6 — ABNORMAL HIGH
Prothrombin Time: 16.7 s — ABNORMAL HIGH (ref 9.0–11.5)

## 2019-04-06 LAB — MICROALBUMIN / CREATININE URINE RATIO
Creatinine, Urine: 111 mg/dL (ref 20–320)
Microalb Creat Ratio: 4 mcg/mg creat (ref <30)
Microalb, Ur: 0.4 mg/dL

## 2019-04-06 LAB — PSA: PSA: <0.1 ng/mL (ref <=4.0)

## 2019-04-06 LAB — URIC ACID: Uric Acid, Serum: 3.4 mg/dL — ABNORMAL LOW (ref 4.0–8.0)

## 2019-04-22 ENCOUNTER — Encounter: Payer: Self-pay | Admitting: *Deleted

## 2019-04-23 ENCOUNTER — Other Ambulatory Visit: Payer: Self-pay

## 2019-04-23 DIAGNOSIS — Z1211 Encounter for screening for malignant neoplasm of colon: Secondary | ICD-10-CM

## 2019-04-23 DIAGNOSIS — Z1212 Encounter for screening for malignant neoplasm of rectum: Secondary | ICD-10-CM

## 2019-04-23 LAB — POC HEMOCCULT BLD/STL (HOME/3-CARD/SCREEN)
Card #2 Fecal Occult Blod, POC: NEGATIVE
Card #3 Fecal Occult Blood, POC: NEGATIVE
Fecal Occult Blood, POC: NEGATIVE

## 2019-04-25 NOTE — Progress Notes (Signed)
Virtual Visit via Video Note   This visit type was conducted due to national recommendations for restrictions regarding the COVID-19 Pandemic (e.g. social distancing) in an effort to limit this patient's exposure and mitigate transmission in our community.  Due to his co-morbid illnesses, this patient is at least at moderate risk for complications without adequate follow up.  This format is felt to be most appropriate for this patient at this time.  All issues noted in this document were discussed and addressed.  A limited physical exam was performed with this format.  Please refer to the patient's chart for his consent to telehealth for [facility].   Date:  04/26/2019   ID:  Robert Arnold, DOB [birth year], MRN [id number]  Patient Location: Home Provider Location: Office  PCP:  [name], MD  Cardiologist:  Mertie Moores, MD  Electrophysiologist:  None   Evaluation Performed:  Follow-Up Visit  Chief Complaint:  Paroxysmal atrial fib,  ? Of trifascicular heart block    History of Present Illness:    Robert Arnold is a 74 y.o. male with PAF and variable heart block, CAD, - s/p stenting and eventually CABG ,   Renal failure - s/p renal transplant  His last EKG was from April 05, 2019.  It revealed Mobitz type I AV block.  Occasional premature ventricular contractions.  His heart rate was normal.  Has done well since last year. Is exercising  Staying health from covid.   No fever, no cough .  No arrhthmias to suggest Afib.   Busy around projects around the house.  On coumadin,  Managed by Dr. Melford Aase.     The patient does not have symptoms concerning for COVID-19 infection (fever, chills, cough, or new shortness of breath).    Past Medical History:  Diagnosis Date  . Adenomatous colon polyp   . Allergy   . BPH (benign prostatic hyperplasia)   . CHF (congestive heart failure) ([place])   . Chronic kidney disease    due IgA nephropathy - s/p kidnet transplant  09/25/11  . Diabetes mellitus type 2, controlled ([place])   . FHx: heart disease 02/27/2018  . Glaucoma   . Gout   . 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 MI (myocardial infarction) ([place]) 09/26/2011  . NSTEMI (non-ST elevated myocardial infarction) ([place]) 11/26/2013   2013   . PAF (paroxysmal atrial fibrillation) ([place])    s/p DC-CV in 6/13. Off coumadin due to ureteral bleed   Past Surgical History:  Procedure Laterality Date  . ANTERIOR CERVICAL DECOMP/DISCECTOMY FUSION N/A 05/04/2018   Procedure: Cervical Five-Six Anterior cervical decompression/discectomy/fusion;  Surgeon: Jovita Gamma, MD;  Location: [facility];  Service: Neurosurgery;  Laterality: N/A;  Cervical Five-Six Anterior cervical decompression/discectomy/fusion  . AV FISTULA PLACEMENT  2011   Left forearm  . COLONOSCOPY    . CORONARY ANGIOPLASTY WITH STENT PLACEMENT    . CORONARY ARTERY BYPASS GRAFT  2013  . KIDNEY TRANSPLANT  09/25/2011  . PERITONEAL CATHETER INSERTION  2011     Current Meds  Medication Sig  . allopurinol (ZYLOPRIM) 300 MG tablet Take 1 tablet Daily for Gout Prevention  . Ascorbic Acid (VITAMIN C) 1000 MG tablet Take 1,000 mg by mouth daily.  Marland Kitchen aspirin EC 81 MG tablet Take 81 mg by mouth daily.  Marland Kitchen atorvastatin (LIPITOR) 40 MG tablet Take 1 tablet  Daily for Cholesterol  . betamethasone dipropionate (DIPROLENE) 0.05 % cream Apply 1 application topically daily as needed (eczema).   Marland Kitchen  Black Pepper-Turmeric (TURMERIC COMPLEX/BLACK PEPPER PO) Take 1,500 mg by mouth daily.  Marland Kitchen buPROPion (WELLBUTRIN XL) 300 MG 24 hr tablet Take 1 tablet every Morning for Mood  . Calcium Carbonate-Vitamin D (OYSTER SHELL CALCIUM/D) 250-125 MG-UNIT TABS Take by mouth.  . finasteride (PROSCAR) 5 MG tablet Take 1 tablet Daily for Prostate  . glucose blood (FREESTYLE TEST STRIPS) test strip Check blood sugar 3 to 4 times daily for medication regulation.  . insulin NPH-regular Human (NOVOLIN 70/30) (70-30) 100 UNIT/ML  injection Take 20-30 units 2 x day or as directed  . latanoprost (XALATAN) 0.005 % ophthalmic solution Place 1 drop into both eyes at bedtime.   Marland Kitchen MAGNESIUM-OXIDE 400 (241.3 Mg) MG tablet Take 400 mg by mouth 2 (two) times daily.   . mycophenolate (MYFORTIC) 360 MG TBEC EC tablet TAKE 1 TABLET BY MOUTH TWICE DAILY  . Probiotic Product (PROBIOTIC DAILY PO) Take 1 capsule by mouth daily.  . tacrolimus (PROGRAF) 0.5 MG capsule Take 0.5 mg by mouth 2 (two) times daily.  . tacrolimus (PROGRAF) 1 MG capsule Take 1 mg by mouth 2 (two) times daily.  . traZODone (DESYREL) 50 MG tablet Take 1 tablet (50 mg total) by mouth at bedtime as needed for sleep.  Marland Kitchen VITAMIN D PO Take 5,000 Units by mouth. Takes two 5000 unit capsules on MWF and 1 capsule 4 days a week.  . warfarin (COUMADIN) 5 MG tablet Take 1 to 2 tablets Daily or as directed  . Zinc 50 MG CAPS Take 50 mg by mouth daily.     Allergies:   Losartan, Crestor [rosuvastatin], Lorazepam, and Morphine and related   Social History   Tobacco Use  . Smoking status: Former Smoker    Quit date: 11/29/1990    Years since quitting: 28.4  . Smokeless tobacco: Never Used  Substance Use Topics  . Alcohol use: Yes    Comment: 1-2 a month  . Drug use: No     Family Hx: The patient's family history includes COPD in his mother; Diabetes in his mother; Heart attack in his father; Kidney disease in his sister. There is no history of Colon cancer, Stomach cancer, Rectal cancer, Esophageal cancer, or Liver cancer.  ROS:   Please see the history of present illness.     All other systems reviewed and are negative.   Prior CV studies:   The following studies were reviewed today:    Labs/Other Tests and Data Reviewed:    EKG:  No ECG reviewed.  Recent Labs: 03/01/2019: Magnesium 1.8; TSH 2.05 04/05/2019: ALT 19; BUN 22; Creat 1.16; Hemoglobin 15.1; Platelets 136; Potassium 4.4; Sodium 140   Recent Lipid Panel Lab Results  Component Value  Date/Time   CHOL 148 03/01/2019 03:27 PM   CHOL 158 03/15/2013 07:44 AM   TRIG 79 03/01/2019 03:27 PM   HDL 66 03/01/2019 03:27 PM   HDL 61 03/15/2013 07:44 AM   CHOLHDL 2.2 03/01/2019 03:27 PM   LDLCALC 66 03/01/2019 03:27 PM    Wt Readings from Last 3 Encounters:  04/26/19 178 lb 12.8 oz (81.1 kg)  04/05/19 184 lb (83.5 kg)  03/01/19 183 lb (83 kg)     Objective:    Vital Signs:  BP 139/76   Pulse 66   Temp 97.8 F (36.6 C)   Ht 5\' 8"  (1.727 m)   Wt 178 lb 12.8 oz (81.1 kg)   BMI 27.19 kg/m    VITAL SIGNS:  reviewed GEN:  no acute  distress EYES:  sclerae anicteric, EOMI - Extraocular Movements Intact RESPIRATORY:  normal respiratory effort, symmetric expansion CARDIOVASCULAR:  no peripheral edema SKIN:  no rash, lesions or ulcers. MUSCULOSKELETAL:  no obvious deformities. NEURO:  alert and oriented x 3, no obvious focal deficit PSYCH:  normal affect  ASSESSMENT & PLAN:    1. Mobitz I  AV block .   No symptoms of syncope or presyncope.  No other symptoms.   Encouraged him to get out and exercise more.     COVID-19 Education: The signs and symptoms of COVID-19 were discussed with the patient and how to seek care for testing (follow up with PCP or arrange E-visit).  The importance of social distancing was discussed today.  Time:   Today, I have spent  18  minutes with the patient with telehealth technology discussing the above problems.     Medication Adjustments/Labs and Tests Ordered: Current medicines are reviewed at length with the patient today.  Concerns regarding medicines are outlined above.   Tests Ordered: No orders of the defined types were placed in this encounter.   Medication Changes: No orders of the defined types were placed in this encounter.   Follow Up:  In Person in 1 year(s)  Signed, Mertie Moores, MD  04/26/2019 9:53 AM    [facility]

## 2019-04-26 ENCOUNTER — Encounter: Payer: Self-pay | Admitting: Cardiovascular Disease

## 2019-04-26 ENCOUNTER — Other Ambulatory Visit: Payer: Self-pay

## 2019-04-26 ENCOUNTER — Telehealth (INDEPENDENT_AMBULATORY_CARE_PROVIDER_SITE_OTHER): Payer: Medicare Other | Admitting: Cardiovascular Disease

## 2019-04-26 VITALS — BP 139/76 | HR 66 | Temp 97.8°F | Ht 68.0 in | Wt 178.8 lb

## 2019-04-26 DIAGNOSIS — I480 Paroxysmal atrial fibrillation: Secondary | ICD-10-CM

## 2019-04-26 DIAGNOSIS — I10 Essential (primary) hypertension: Secondary | ICD-10-CM

## 2019-04-26 DIAGNOSIS — Z7901 Long term (current) use of anticoagulants: Secondary | ICD-10-CM

## 2019-04-26 DIAGNOSIS — I441 Atrioventricular block, second degree: Secondary | ICD-10-CM

## 2019-04-26 DIAGNOSIS — Z7189 Other specified counseling: Secondary | ICD-10-CM

## 2019-04-26 NOTE — Patient Instructions (Signed)
Medication Instructions:  Your physician recommends that you continue on your current medications as directed. Please refer to the Current Medication list given to you today.  *If you need a refill on your cardiac medications before your next appointment, please call your pharmacy*  Lab Work: None Ordered If you have labs (blood work) drawn today and your tests are completely normal, you will receive your results only by: . MyChart Message (if you have MyChart) OR . A paper copy in the mail If you have any lab test that is abnormal or we need to change your treatment, we will call you to review the results.   Testing/Procedures: None Ordered   Follow-Up: At [facility], you and your health needs are our priority.  As part of our continuing mission to provide you with exceptional heart care, we have created designated Provider Care Teams.  These Care Teams include your primary Cardiologist (physician) and Advanced Practice Providers (APPs -  Physician Assistants and Nurse Practitioners) who all work together to provide you with the care you need, when you need it.  Your next appointment:   1 year(s)  The format for your next appointment:   In Person  Provider:   You may see Philip Nahser, MD or one of the following Advanced Practice Providers on your designated Care Team:    Scott Weaver, PA-C  Vin Bhagat, PA-C  Janine Hammond, NP    

## 2019-05-04 NOTE — Progress Notes (Signed)
Assessment and Plan:  Hypercoagulopathy ([place]) -     PT (Prothrombin Time)  Medication management -     Magnesium  Vitamin D deficiency -     Vitamin D (25 hydroxy)  PAF (paroxysmal atrial fibrillation) (HCC) Check INR and will adjust medication according to labs.  Follow up in one month.   Chronic systolic heart failure (HCC) Weights stable Emphasized salt restriction, less than 2000mg  a day.. If any increasing shortness of breath, swelling, or chest pressure go to ER immediately.  decrease your fluid intake to less than 2 L daily  Thrombocytopenia (HCC) monitor  Senile purpura Discussed process, protect skin, sunscreen  Depression remission - continue medications, stress management techniques discussed, increase water, good sleep hygiene discussed, increase exercise, and increase veggies.   Essential hypertension -     CBC with Differential/Platelet -     COMPLETE METABOLIC PANEL WITH GFR -     TSH  CKD stage 2 due to type 2 diabetes mellitus (HCC) -     Hemoglobin A1c  Hyperlipidemia associated with type 2 diabetes mellitus ([place]) -     Lipid panel  Type 2 diabetes mellitus with stage 2 chronic kidney disease, with long-term current use of insulin ([place]) Discussed general issues about diabetes pathophysiology and management., Educational material distributed., Suggested low cholesterol diet., Encouraged aerobic exercise., Discussed foot care., Reminded to get yearly retinal exam.   Further disposition pending results of labs. Discussed med's effects and SE's.   Over 30 minutes of exam, counseling, chart review, and critical decision making was performed.   Future Appointments  Date Time Provider [place]  06/08/2019  9:30 AM Garnet Sierras, NP [facility] None  07/19/2019  9:30 AM [name], MD [facility] None  09/15/2019 10:00 AM Liane Comber, NP [facility] None  04/18/2020  9:00 AM [name], MD [facility] None     ------------------------------------------------------------------------------------------------------------------  HPI 74 y.o.male presents for follow up on CHF, a. Fib on coumadin, htn, weight.  BMI is Body mass index is 28.28 kg/m., he is working on diet and exercise. Wt Readings from Last 3 Encounters:  05/06/19 186 lb (84.4 kg)  04/26/19 178 lb 12.8 oz (81.1 kg)  04/05/19 184 lb (83.5 kg)    His blood pressure has been controlled at home, today their BP is BP: 114/60  He does workout. He denies chest pain, shortness of breath, dizziness. On trazodone 100, 1/2 tablet a night, still has from a year ago.   He has a history of Systolic CHF (EF 44-31%, + dystolic 54/0086), denies dyspnea on exertion, orthopnea, paroxysmal nocturnal dyspnea and edema. Positive for none. Wt Readings from Last 3 Encounters:  05/06/19 186 lb (84.4 kg)  04/26/19 178 lb 12.8 oz (81.1 kg)  04/05/19 184 lb (83.5 kg)   Patient is on Coumadin for Afib Patient's last INR is  Lab Results  Component Value Date   INR 1.6 (H) 04/05/2019   INR 2.1 (H) 03/01/2019   INR 1.7 (H) 01/26/2019    Patient denies SOB, CP, dizziness, nose bleeds, easy bleeding, and blood in stool/urine. His coumadin dose was changed last visit. He has not taken ABX, has not missed any doses and denies a fall. We are monitoring his CBC.   Current dose: 1 tab of coumadin 3 days and 1.5 tabs on 4 days a week.    He is on cholesterol medication and denies myalgias. His cholesterol is not at goal of less than 70. The cholesterol last visit was:  Lab Results  Component Value Date   CHOL 148 03/01/2019   HDL 66 03/01/2019   LDLCALC 66 03/01/2019   TRIG 79 03/01/2019   CHOLHDL 2.2 03/01/2019    He has been working on diet and exercise for prediabetes, and denies paresthesia of the feet, polydipsia, polyuria and visual disturbances. Last A1C in the office was:  Lab Results  Component Value Date   HGBA1C 5.4 03/01/2019   Patient  is on Vitamin D supplement.   Lab Results  Component Value Date   VD25OH 94 03/01/2019      Past Medical History:  Diagnosis Date  . Adenomatous colon polyp   . Allergy   . BPH (benign prostatic hyperplasia)   . CHF (congestive heart failure) ([place])   . 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 Diabetes mellitus type 2, controlled ([place])   . FHx: heart disease 02/27/2018  . Glaucoma   . Gout   . 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 MI (myocardial infarction) ([place]) 09/26/2011  . NSTEMI (non-ST elevated myocardial infarction) ([place]) 11/26/2013   2013   . PAF (paroxysmal atrial fibrillation) ([place])    s/p DC-CV in 6/13. Off coumadin due to ureteral bleed     Allergies  Allergen Reactions  . Losartan Anaphylaxis  . Crestor [Rosuvastatin]     Elevated LFT's  . Lorazepam     Pt is unsure of reaction   . Morphine And Related Other (See Comments)    Has no effect on pt      Current Outpatient Medications (Endocrine & Metabolic):  .  insulin NPH-regular Human (NOVOLIN 70/30) (70-30) 100 UNIT/ML injection, Take 20-30 units 2 x day or as directed  Current Outpatient Medications (Cardiovascular):  .  atorvastatin (LIPITOR) 40 MG tablet, Take 1 tablet  Daily for Cholesterol   Current Outpatient Medications (Analgesics):  .  allopurinol (ZYLOPRIM) 300 MG tablet, Take 1 tablet Daily for Gout Prevention .  aspirin EC 81 MG tablet, Take 81 mg by mouth daily.  Current Outpatient Medications (Hematological):  .  warfarin (COUMADIN) 5 MG tablet, Take 1 to 2 tablets Daily or as directed  Current Outpatient Medications (Other):  Marland Kitchen  Ascorbic Acid (VITAMIN C) 1000 MG tablet, Take 1,000 mg by mouth daily. .  betamethasone dipropionate (DIPROLENE) 0.05 % cream, Apply 1 application topically daily as needed (eczema).  .  Black Pepper-Turmeric (TURMERIC COMPLEX/BLACK PEPPER PO), Take 1,500 mg by mouth daily. Marland Kitchen  buPROPion (WELLBUTRIN XL)  300 MG 24 hr tablet, Take 1 tablet every Morning for Mood .  Calcium Carbonate-Vitamin D (OYSTER SHELL CALCIUM/D) 250-125 MG-UNIT TABS, Take by mouth. .  finasteride (PROSCAR) 5 MG tablet, Take 1 tablet Daily for Prostate .  glucose blood (FREESTYLE TEST STRIPS) test strip, Check blood sugar 3 to 4 times daily for medication regulation. Marland Kitchen  latanoprost (XALATAN) 0.005 % ophthalmic solution, Place 1 drop into both eyes at bedtime.  Marland Kitchen  MAGNESIUM-OXIDE 400 (241.3 Mg) MG tablet, Take 400 mg by mouth 2 (two) times daily.  .  mycophenolate (MYFORTIC) 360 MG TBEC EC tablet, TAKE 1 TABLET BY MOUTH TWICE DAILY .  Probiotic Product (PROBIOTIC DAILY PO), Take 1 capsule by mouth daily. .  tacrolimus (PROGRAF) 0.5 MG capsule, Take 0.5 mg by mouth 2 (two) times daily. .  tacrolimus (PROGRAF) 1 MG capsule, Take 1 mg by mouth 2 (two) times daily. .  traZODone (DESYREL) 50 MG tablet, Take  1 tablet (50 mg total) by mouth at bedtime as needed for sleep. Marland Kitchen  VITAMIN D PO, Take 5,000 Units by mouth. Takes two 5000 unit capsules on MWF and 1 capsule 4 days a week. .  Zinc 50 MG CAPS, Take 50 mg by mouth daily.  ROS: all negative except above.   Physical Exam:  BP 114/60   Pulse 81   Temp 97.6 F (36.4 C)   Wt 186 lb (84.4 kg)   SpO2 96%   BMI 28.28 kg/m   General Appearance: Well nourished, in no apparent distress. Eyes: PERRLA, EOMs, conjunctiva no swelling or erythema Sinuses: No Frontal/maxillary tenderness ENT/Mouth: Ext aud canals clear, TMs without erythema, bulging. No erythema, swelling, or exudate on post pharynx.  Tonsils not swollen or erythematous. Hearing normal.  Neck: Supple, thyroid normal.  Respiratory: Respiratory effort normal, BS equal bilaterally without rales, rhonchi, wheezing or stridor.  Cardio: Irregularly irregular, 3/6 early systolic blowing murmur. Brisk peripheral pulses, left TP delayed compared to right TP, without edema. Fistula to left wrist intact, slightly engorged  without erythema, tenderness, discharge, or warmth. Good distal sensation and cap refill.  Abdomen: Soft, + BS.  Non tender, no guarding, rebound, hernias, masses. Lymphatics: Non tender without lymphadenopathy.  Musculoskeletal: Full ROM, 5/5 strength, normal gait.  Skin: left arm with well healing 5 cm area, no warmth, swelling, erythema. Warm, dry without rashes, lesions, ecchymosis.  Neuro: Cranial nerves intact. Normal muscle tone, no cerebellar symptoms.  Psych: Awake and oriented X 3, normal affect, Insight and Judgment appropriate.     Vicie Mutters, PA-C 8:56 AM [facility]

## 2019-05-06 ENCOUNTER — Encounter: Payer: Self-pay | Admitting: Physician Assistant

## 2019-05-06 ENCOUNTER — Other Ambulatory Visit: Payer: Self-pay

## 2019-05-06 ENCOUNTER — Ambulatory Visit (INDEPENDENT_AMBULATORY_CARE_PROVIDER_SITE_OTHER): Payer: Medicare Other | Admitting: Physician Assistant

## 2019-05-06 VITALS — BP 114/60 | HR 81 | Temp 97.6°F | Wt 186.0 lb

## 2019-05-06 DIAGNOSIS — I5022 Chronic systolic (congestive) heart failure: Secondary | ICD-10-CM | POA: Diagnosis not present

## 2019-05-06 DIAGNOSIS — E1169 Type 2 diabetes mellitus with other specified complication: Secondary | ICD-10-CM | POA: Diagnosis not present

## 2019-05-06 DIAGNOSIS — D692 Other nonthrombocytopenic purpura: Secondary | ICD-10-CM

## 2019-05-06 DIAGNOSIS — D696 Thrombocytopenia, unspecified: Secondary | ICD-10-CM

## 2019-05-06 DIAGNOSIS — Z794 Long term (current) use of insulin: Secondary | ICD-10-CM

## 2019-05-06 DIAGNOSIS — D6859 Other primary thrombophilia: Secondary | ICD-10-CM

## 2019-05-06 DIAGNOSIS — E785 Hyperlipidemia, unspecified: Secondary | ICD-10-CM

## 2019-05-06 DIAGNOSIS — I480 Paroxysmal atrial fibrillation: Secondary | ICD-10-CM

## 2019-05-06 DIAGNOSIS — E1122 Type 2 diabetes mellitus with diabetic chronic kidney disease: Secondary | ICD-10-CM | POA: Diagnosis not present

## 2019-05-06 DIAGNOSIS — N182 Chronic kidney disease, stage 2 (mild): Secondary | ICD-10-CM

## 2019-05-06 DIAGNOSIS — F325 Major depressive disorder, single episode, in full remission: Secondary | ICD-10-CM

## 2019-05-07 LAB — CBC WITH DIFFERENTIAL/PLATELET
Absolute Monocytes: 392 cells/uL (ref 200–950)
Basophils Absolute: 41 cells/uL (ref 0–200)
Basophils Relative: 0.9 %
Eosinophils Absolute: 72 cells/uL (ref 15–500)
Eosinophils Relative: 1.6 %
HCT: 43.5 % (ref 38.5–50.0)
Hemoglobin: 15 g/dL (ref 13.2–17.1)
Lymphs Abs: 945 cells/uL (ref 850–3900)
MCH: 32.1 pg (ref 27.0–33.0)
MCHC: 34.5 g/dL (ref 32.0–36.0)
MCV: 93.1 fL (ref 80.0–100.0)
MPV: 9.7 fL (ref 7.5–12.5)
Monocytes Relative: 8.7 %
Neutro Abs: 3051 cells/uL (ref 1500–7800)
Neutrophils Relative %: 67.8 %
Platelets: 140 10*3/uL (ref 140–400)
RBC: 4.67 10*6/uL (ref 4.20–5.80)
RDW: 12.2 % (ref 11.0–15.0)
Total Lymphocyte: 21 %
WBC: 4.5 10*3/uL (ref 3.8–10.8)

## 2019-05-07 LAB — COMPLETE METABOLIC PANEL WITH GFR
AG Ratio: 1.8 (calc) (ref 1.0–2.5)
ALT: 21 U/L (ref 9–46)
AST: 25 U/L (ref 10–35)
Albumin: 3.9 g/dL (ref 3.6–5.1)
Alkaline phosphatase (APISO): 107 U/L (ref 35–144)
BUN/Creatinine Ratio: 25 (calc) — ABNORMAL HIGH (ref 6–22)
BUN: 26 mg/dL — ABNORMAL HIGH (ref 7–25)
CO2: 26 mmol/L (ref 20–32)
Calcium: 9.6 mg/dL (ref 8.6–10.3)
Chloride: 106 mmol/L (ref 98–110)
Creat: 1.03 mg/dL (ref 0.70–1.18)
GFR, Est African American: 84 mL/min/{1.73_m2} (ref >=60)
GFR, Est Non African American: 73 mL/min/{1.73_m2} (ref >=60)
Globulin: 2.2 g/dL (calc) (ref 1.9–3.7)
Glucose, Bld: 107 mg/dL — ABNORMAL HIGH (ref 65–99)
Potassium: 4.4 mmol/L (ref 3.5–5.3)
Sodium: 141 mmol/L (ref 135–146)
Total Bilirubin: 1 mg/dL (ref 0.2–1.2)
Total Protein: 6.1 g/dL (ref 6.1–8.1)

## 2019-05-07 LAB — PROTIME-INR
INR: 2.9 — ABNORMAL HIGH
Prothrombin Time: 28.2 s — ABNORMAL HIGH (ref 9.0–11.5)

## 2019-05-19 NOTE — [place] Notes (Signed)
Subjective:    Patient ID: Robert Arnold, male    DOB: Feb 05, 1946, 74 y.o.   MRN: [id number]  HPI 74 y.o. WM with history of cervical neck fusion 04/2018 at C5 C6 with Dr. Sherwood Gambler presents with neck pain.  Has severe neck pain right of shoulder blade into his left arm x Sunday. Tylenol and bengay is not helping, heat is not helping, ice is helping.   It is better after sleeping, worse after any movement. He did scoot furniture Sunday, did not lift anything, no pain at the time but that night started with neck pain.   He has had some nausea this AM. No pain with exertion, no diaphoresis, SOB , CP.     Lab Results  Component Value Date   GFRNONAA 73 05/06/2019     Blood pressure 120/68, pulse (!) 57, temperature (!) 97.5 F (36.4 C), weight 184 lb (83.5 kg), SpO2 98 %. Past Surgical History:  Procedure Laterality Date  . ANTERIOR CERVICAL DECOMP/DISCECTOMY FUSION N/A 05/04/2018   Procedure: Cervical Five-Six Anterior cervical decompression/discectomy/fusion;  Surgeon: Jovita Gamma, MD;  Location: [facility];  Service: Neurosurgery;  Laterality: N/A;  Cervical Five-Six Anterior cervical decompression/discectomy/fusion  . AV FISTULA PLACEMENT  2011   Left forearm  . COLONOSCOPY    . CORONARY ANGIOPLASTY WITH STENT PLACEMENT    . CORONARY ARTERY BYPASS GRAFT  2013  . KIDNEY TRANSPLANT  09/25/2011  . PERITONEAL CATHETER INSERTION  2011    Medications  Current Outpatient Medications (Endocrine & Metabolic):  .  insulin NPH-regular Human (NOVOLIN 70/30) (70-30) 100 UNIT/ML injection, Take 20-30 units 2 x day or as directed  Current Outpatient Medications (Cardiovascular):  .  atorvastatin (LIPITOR) 40 MG tablet, Take 1 tablet  Daily for Cholesterol   Current Outpatient Medications (Analgesics):  .  allopurinol (ZYLOPRIM) 300 MG tablet, Take 1 tablet Daily for Gout Prevention .  aspirin EC 81 MG tablet, Take 81 mg by mouth daily.  Current Outpatient Medications  (Hematological):  .  warfarin (COUMADIN) 5 MG tablet, Take 1 to 2 tablets Daily or as directed  Current Outpatient Medications (Other):  Marland Kitchen  Ascorbic Acid (VITAMIN C) 1000 MG tablet, Take 1,000 mg by mouth daily. .  betamethasone dipropionate (DIPROLENE) 0.05 % cream, Apply 1 application topically daily as needed (eczema).  .  Black Pepper-Turmeric (TURMERIC COMPLEX/BLACK PEPPER PO), Take 1,500 mg by mouth daily. Marland Kitchen  buPROPion (WELLBUTRIN XL) 300 MG 24 hr tablet, Take 1 tablet every Morning for Mood .  Calcium Carbonate-Vitamin D (OYSTER SHELL CALCIUM/D) 250-125 MG-UNIT TABS, Take by mouth. .  finasteride (PROSCAR) 5 MG tablet, Take 1 tablet Daily for Prostate .  glucose blood (FREESTYLE TEST STRIPS) test strip, Check blood sugar 3 to 4 times daily for medication regulation. Marland Kitchen  latanoprost (XALATAN) 0.005 % ophthalmic solution, Place 1 drop into both eyes at bedtime.  Marland Kitchen  MAGNESIUM-OXIDE 400 (241.3 Mg) MG tablet, Take 400 mg by mouth 2 (two) times daily.  .  mycophenolate (MYFORTIC) 360 MG TBEC EC tablet, TAKE 1 TABLET BY MOUTH TWICE DAILY .  Probiotic Product (PROBIOTIC DAILY PO), Take 1 capsule by mouth daily. .  tacrolimus (PROGRAF) 0.5 MG capsule, Take 0.5 mg by mouth 2 (two) times daily. .  tacrolimus (PROGRAF) 1 MG capsule, Take 1 mg by mouth 2 (two) times daily. .  traZODone (DESYREL) 50 MG tablet, Take 1 tablet (50 mg total) by mouth at bedtime as needed for sleep. Marland Kitchen  VITAMIN D PO, Take 5,000  Units by mouth. Takes two 5000 unit capsules on MWF and 1 capsule 4 days a week. .  Zinc 50 MG CAPS, Take 50 mg by mouth daily.  Problem list He has Chronic systolic heart failure ([place]); CKD stage 2 due to type 2 diabetes mellitus ([place]); Hyperlipidemia associated with type 2 diabetes mellitus ([place]); BPH with obstruction/lower urinary tract symptoms; Gout; PAF (paroxysmal atrial fibrillation) ([place]); OSA (obstructive sleep apnea); Histoplasmosis; Essential hypertension; Vitamin D deficiency;  Anticoagulant long-term use; Renal Transplant, s/p 09/2011; Depression, major, in remission ([place]); Thrombocytopenia ([place]); CAD (coronary artery disease) of artery bypass graft; IgA nephropathy; Essential tremor; Overweight (BMI 25.0-29.9); Senile purpura ([place]); Type 2 diabetes mellitus with stage 2 chronic kidney disease, with long-term current use of insulin ([place]); Testosterone deficiency; Former smoker; HNP (herniated nucleus pulposus), cervical; S/P cervical spinal fusion; Chronic hip pain, bilateral; Hypercoagulopathy ([place]); and [place] I on their problem list.  Review of Systems  Constitutional: Negative.  Negative for chills and fever.  HENT: Negative.   Respiratory: Negative.  Negative for chest tightness.   Cardiovascular: Negative.  Negative for chest pain, palpitations and leg swelling.  Gastrointestinal: Negative.   Genitourinary: Negative.  Negative for flank pain, frequency, hematuria and urgency.  Musculoskeletal: Positive for neck pain. Negative for arthralgias, back pain, gait problem, joint swelling, myalgias and neck stiffness.  Skin: Negative.  Negative for rash.  Neurological: Positive for numbness (left arm with tingling). Negative for dizziness, tremors, facial asymmetry, weakness and headaches.  Psychiatric/Behavioral: Negative.        Objective:   Physical Exam General Appearance: Well nourished, in no apparent distress. Eyes: PERRLA, EOMs, conjunctiva no swelling or erythema Sinuses: No Frontal/maxillary tenderness ENT/Mouth: Ext aud canals clear, TMs without erythema, bulging. No erythema, swelling, or exudate on post pharynx.  Tonsils not swollen or erythematous. Hearing normal.  Neck: Supple, thyroid normal.  Respiratory: Respiratory effort normal, BS equal bilaterally without rales, rhonchi, wheezing or stridor.  Cardio: Irregularly irregular, 3/6 early systolic blowing murmur. Brisk peripheral pulses, left TP delayed compared to right TP, without edema. Fistula to  left wrist intact, slightly engorged without erythema, tenderness, discharge, or warmth. Good distal sensation and cap refill.  Abdomen: Soft, + BS.  Non tender, no guarding, rebound, hernias, masses. Lymphatics: Non tender without lymphadenopathy.  Musculoskeletal: Full ROM, 5/5 strength, normal gait. Pain with neck flexion and extension, with spinous process tenderness C5/C6, with paraspinal muscle tenderness the left side, normal sensation, reflexes, and pulses distal. Normal reflexes, good grip and good strength.  Skin: + senile purpura, no warmth, swelling, erythema. Warm, dry without rashes, lesions, ecchymosis.  Neuro: Cranial nerves intact. Normal muscle tone, no cerebellar symptoms.  Psych: Awake and oriented X 3, normal affect, Insight and Judgment appropriate.          Assessment & Plan:  [place] was seen today for acute visit.  Diagnoses and all orders for this visit:  Neck pain Likely coming from his neck, will refer back to neurosurgery for evaluation, will do gabapentin in the mean time and PT Go to the ER if you have any new weakness in your arms, trouble with your grip, worse headache ever, fever, chills. or have worsening pain.   -     Ambulatory referral to Neurosurgery -     gabapentin (NEURONTIN) 300 MG capsule; Take 1 capsule (300 mg total) by mouth 3 (three) times daily. -     Ambulatory referral to Physical Therapy -     cyclobenzaprine (FLEXERIL) 10 MG tablet;  Take 1 tablet (10 mg total) by mouth 3 (three) times daily as needed for muscle spasms.

## 2019-05-20 ENCOUNTER — Ambulatory Visit (INDEPENDENT_AMBULATORY_CARE_PROVIDER_SITE_OTHER): Payer: Medicare Other | Admitting: Physician Assistant

## 2019-05-20 ENCOUNTER — Other Ambulatory Visit: Payer: Self-pay

## 2019-05-20 ENCOUNTER — Encounter: Payer: Self-pay | Admitting: Physician Assistant

## 2019-05-20 VITALS — BP 120/68 | HR 57 | Temp 97.5°F | Wt 184.0 lb

## 2019-05-20 DIAGNOSIS — M542 Cervicalgia: Secondary | ICD-10-CM | POA: Diagnosis not present

## 2019-05-20 MED ORDER — GABAPENTIN 300 MG PO CAPS: 300.0000 mg | ORAL_CAPSULE | Freq: Three times a day (TID) | ORAL | 2 refills | Status: DC

## 2019-05-20 MED ORDER — CYCLOBENZAPRINE HCL 10 MG PO TABS: 10.0000 mg | ORAL_TABLET | Freq: Three times a day (TID) | ORAL | 1 refills | Status: DC | PRN

## 2019-05-20 NOTE — Patient Instructions (Signed)
Can try voltern gel (do not use too much may affect your coumadin) or salonpas patches over the counter  Can use ice  Can take the gabapentin 300mg  at night.   It can make you sleepy so we suggest trying it at night first and please plan to not drive or do anything strenuous.  Also please do not take this medication with alcohol.   Start out 1 pill at night before bed, can increase to 2 pills at night before bed. Please call the office if you have any side effects.   Can take 3 pills a day total however you would like  Some examples: - 1 breakfast, lunch, bedtime. - 1 at breakfast, 2 at bed time  Common side effects are sleepiness, concentration problems, dizziness, swelling.  Do not stop abruptly unless you have a reaction to it.   Will refer back to Dr. Rita Ohara.    Try the exercises and other information below, meloxicam once during the day as needed (avoid taking other NSAIDS like Alleve or Ibuprofen while taking this) and then flexeril if needed at bedtime for muscle spasm. This can be taken up to every 8 hours, but causes sedation, so should not drive or operate heavy machinery while taking this medicine.   Go to the ER if you have any new weakness in your arms, trouble with your grip, worse headache ever, fever, chills. or have worsening pain.   If you are not better in 1-3 month we will refer you to ortho   Cervical Sprain A cervical sprain is a stretch or tear in one or more of the tough, cord-like tissues that connect bones (ligaments) in the neck. Cervical sprains can range from mild to severe. Severe cervical sprains can cause the spinal bones (vertebrae) in the neck to be unstable. This can lead to spinal cord damage and can result in serious nervous system problems. The amount of time that it takes for a cervical sprain to get better depends on the cause and extent of the injury. Most cervical sprains heal in 4-6 weeks. What are the causes? Cervical sprains may be caused  by an injury (trauma), such as from a motor vehicle accident, a fall, or sudden forward and backward whipping movement of the head and neck (whiplash injury). Mild cervical sprains may be caused by wear and tear over time, such as from poor posture, sitting in a chair that does not provide support, or looking up or down for long periods of time. What increases the risk? The following factors may make you more likely to develop this condition:  Participating in activities that have a high risk of trauma to the neck. These include contact sports, auto racing, gymnastics, and diving.  Taking risks when driving or riding in a motor vehicle, such as speeding.  Having osteoarthritis of the spine.  Having poor strength and flexibility of the neck.  A previous neck injury.  Having poor posture.  Spending a lot of time in certain positions that put stress on the neck, such as sitting at a computer for long periods of time.  What are the signs or symptoms? Symptoms of this condition include:  Pain, soreness, stiffness, tenderness, swelling, or a burning sensation in the front, back, or sides of the neck.  Sudden tightening of neck muscles that you cannot control (muscle spasms).  Pain in the shoulders or upper back.  Limited ability to move the neck.  Headache.  Dizziness.  Nausea.  Vomiting.  Weakness,  numbness, or tingling in a hand or an arm.  Symptoms may develop right away after injury, or they may develop over a few days. In some cases, symptoms may go away with treatment and return (recur) over time. How is this diagnosed? This condition may be diagnosed based on:  Your medical history.  Your symptoms.  Any recent injuries or known neck problems that you have, such as arthritis in the neck.  A physical exam.  Imaging tests, such as: ? X-rays. ? MRI. ? CT scan.  How is this treated? This condition is treated by resting and icing the injured area and doing  physical therapy exercises. Depending on the severity of your condition, treatment may also include:  Keeping your neck in place (immobilized) for periods of time. This may be done using: ? A cervical collar. This supports your chin and the back of your head. ? A cervical traction device. This is a sling that holds up your head. This removes weight and pressure from your neck, and it may help to relieve pain.  Medicines that help to relieve pain and inflammation.  Medicines that help to relax your muscles (muscle relaxants).  Surgery. This is rare.  Follow these instructions at home: If you have a cervical collar:  Wear it as told by your health care provider. Do not remove the collar unless instructed by your health care provider.  Ask your health care provider before you make any adjustments to your collar.  If you have long hair, keep it outside of the collar.  Ask your health care provider if you can remove the collar for cleaning and bathing. If you are allowed to remove the collar for cleaning or bathing: ? Follow instructions from your health care provider about how to remove the collar safely. ? Clean the collar by wiping it with mild soap and water and drying it completely. ? If your collar has removable pads, remove them every 1-2 days and wash them by hand with soap and water. Let them air-dry completely before you put them back in the collar. ? Check your skin under the collar for irritation or sores. If you see any, tell your health care provider. Managing pain, stiffness, and swelling  If directed, use a cervical traction device as told by your health care provider.  If directed, apply heat to the affected area before you do your physical therapy or as often as told by your health care provider. Use the heat source that your health care provider recommends, such as a moist heat pack or a heating pad. ? Place a towel between your skin and the heat source. ? Leave the heat  on for 20-30 minutes. ? Remove the heat if your skin turns bright red. This is especially important if you are unable to feel pain, heat, or cold. You may have a greater risk of getting burned.  If directed, put ice on the affected area: ? Put ice in a plastic bag. ? Place a towel between your skin and the bag. ? Leave the ice on for 20 minutes, 2-3 times a day. Activity  Do not drive while wearing a cervical collar. If you do not have a cervical collar, ask your health care provider if it is safe to drive while your neck heals.  Do not drive or use heavy machinery while taking prescription pain medicine or muscle relaxants, unless your health care provider approves.  Do not lift anything that is heavier than 10 lb (  4.5 kg) until your health care provider tells you that it is safe.  Rest as directed by your health care provider. Avoid positions and activities that make your symptoms worse. Ask your health care provider what activities are safe for you.  If physical therapy was prescribed, do exercises as told by your health care provider or physical therapist. General instructions  Take over-the-counter and prescription medicines only as told by your health care provider.  Do not use any products that contain nicotine or tobacco, such as cigarettes and e-cigarettes. These can delay healing. If you need help quitting, ask your health care provider.  Keep all follow-up visits as told by your health care provider or physical therapist. This is important. How is this prevented? To prevent a cervical sprain from happening again:  Use and maintain good posture. Make any needed adjustments to your workstation to help you use good posture.  Exercise regularly as directed by your health care provider or physical therapist.  Avoid risky activities that may cause a cervical sprain.  Contact a health care provider if:  You have symptoms that get worse or do not get better after 2 weeks of  treatment.  You have pain that gets worse or does not get better with medicine.  You develop new, unexplained symptoms.  You have sores or irritated skin on your neck from wearing your cervical collar. Get help right away if:  You have severe pain.  You develop numbness, tingling, or weakness in any part of your body.  You cannot move a part of your body (you have paralysis).  You have neck pain along with: ? Severe dizziness. ? Headache. Summary  A cervical sprain is a stretch or tear in one or more of the tough, cord-like tissues that connect bones (ligaments) in the neck.  Cervical sprains may be caused by an injury (trauma), such as from a motor vehicle accident, a fall, or sudden forward and backward whipping movement of the head and neck (whiplash injury).  Symptoms may develop right away after injury, or they may develop over a few days.  This condition is treated by resting and icing the injured area and doing physical therapy exercises. This information is not intended to replace advice given to you by your health care provider. Make sure you discuss any questions you have with your health care provider. Document Released: 01/20/2007 Document Revised: 11/22/2015 Document Reviewed: 11/22/2015 Elsevier Interactive Patient Education  2017 [employer]   Cervical Strain and Sprain Rehab Ask your health care provider which exercises are safe for you. Do exercises exactly as told by your health care provider and adjust them as directed. It is normal to feel mild stretching, pulling, tightness, or discomfort as you do these exercises, but you should stop right away if you feel sudden pain or your pain gets worse.Do not begin these exercises until told by your health care provider. Stretching and range of motion exercises These exercises warm up your muscles and joints and improve the movement and flexibility of your neck. These exercises also help to relieve pain, numbness,  and tingling. Exercise A: Cervical side bend  1. Using good posture, sit on a stable chair or stand up. 2. Without moving your shoulders, slowly tilt your left / right ear to your shoulder until you feel a stretch in your neck muscles. You should be looking straight ahead. 3. Hold for __________ seconds. 4. Repeat with the other side of your neck. Repeat __________ times. Complete this  exercise __________ times a day. Exercise B: Cervical rotation  1. Using good posture, sit on a stable chair or stand up. 2. Slowly turn your head to the side as if you are looking over your left / right shoulder. ? Keep your eyes level with the ground. ? Stop when you feel a stretch along the side and the back of your neck. 3. Hold for __________ seconds. 4. Repeat this by turning to your other side. Repeat __________ times. Complete this exercise __________ times a day. Exercise C: Thoracic extension and pectoral stretch 1. Roll a towel or a small blanket so it is about 4 inches (10 cm) in diameter. 2. Lie down on your back on a firm surface. 3. Put the towel lengthwise, under your spine in the middle of your back. It should not be not under your shoulder blades. The towel should line up with your spine from your middle back to your lower back. 4. Put your hands behind your head and let your elbows fall out to your sides. 5. Hold for __________ seconds. Repeat __________ times. Complete this exercise __________ times a day. Strengthening exercises These exercises build strength and endurance in your neck. Endurance is the ability to use your muscles for a long time, even after your muscles get tired. Exercise D: Upper cervical flexion, isometric 1. Lie on your back with a thin pillow behind your head and a small rolled-up towel under your neck. 2. Gently tuck your chin toward your chest and nod your head down to look toward your feet. Do not lift your head off the pillow. 3. Hold for __________  seconds. 4. Release the tension slowly. Relax your neck muscles completely before you repeat this exercise. Repeat __________ times. Complete this exercise __________ times a day. Exercise E: Cervical extension, isometric  1. Stand about 6 inches (15 cm) away from a wall, with your back facing the wall. 2. Place a soft object, about 6-8 inches (15-20 cm) in diameter, between the back of your head and the wall. A soft object could be a small pillow, a ball, or a folded towel. 3. Gently tilt your head back and press into the soft object. Keep your jaw and forehead relaxed. 4. Hold for __________ seconds. 5. Release the tension slowly. Relax your neck muscles completely before you repeat this exercise. Repeat __________ times. Complete this exercise __________ times a day. Posture and body mechanics  Body mechanics refers to the movements and positions of your body while you do your daily activities. Posture is part of body mechanics. Good posture and healthy body mechanics can help to relieve stress in your body's tissues and joints. Good posture means that your spine is in its natural S-curve position (your spine is neutral), your shoulders are pulled back slightly, and your head is not tipped forward. The following are general guidelines for applying improved posture and body mechanics to your everyday activities. Standing  When standing, keep your spine neutral and keep your feet about hip-width apart. Keep a slight bend in your knees. Your ears, shoulders, and hips should line up.  When you do a task in which you stand in one place for a long time, place one foot up on a stable object that is 2-4 inches (5-10 cm) high, such as a footstool. This helps keep your spine neutral. Sitting   When sitting, keep your spine neutral and your keep feet flat on the floor. Use a footrest, if necessary, and keep your thighs  parallel to the floor. Avoid rounding your shoulders, and avoid tilting your head  forward.  When working at a desk or a computer, keep your desk at a height where your hands are slightly lower than your elbows. Slide your chair under your desk so you are close enough to maintain good posture.  When working at a computer, place your monitor at a height where you are looking straight ahead and you do not have to tilt your head forward or downward to look at the screen. Resting When lying down and resting, avoid positions that are most painful for you. Try to support your neck in a neutral position. You can use a contour pillow or a small rolled-up towel. Your pillow should support your neck but not push on it. This information is not intended to replace advice given to you by your health care provider. Make sure you discuss any questions you have with your health care provider. Document Released: 03/25/2005 Document Revised: 11/30/2015 Document Reviewed: 03/01/2015 Elsevier Interactive Patient Education  [employer].

## 2019-05-26 ENCOUNTER — Telehealth: Payer: Self-pay | Admitting: *Deleted

## 2019-05-26 NOTE — Telephone Encounter (Signed)
   [facility] Pre-operative Risk Assessment    Request for surgical clearance:  1. What type of surgery is being performed? CERVICAL FUSION   2. When is this surgery scheduled? TBD   3. What type of clearance is required (medical clearance vs. Pharmacy clearance to hold med vs. Both)? BOTH  4. Are there any medications that need to be held prior to surgery and how long? MED LIST STATES PT IS ON ASA AND WARFARIN   5. Practice name and name of physician performing surgery? [place] [facility]; DR. Herbie Baltimore NUDLEMAN   6. What is your office phone number [phone] EXT [phone]    7.   What is your office fax number [phone]  8.   Anesthesia type (None, local, MAC, general) ? GENERAL   Julaine Hua 05/26/2019, 11:37 AM  _________________________________________________________________   (provider comments below)

## 2019-05-27 NOTE — Telephone Encounter (Signed)
Pt was seen by Dr. Rita Ohara yesterday - no surgery planned at this time. Will remove from pool.

## 2019-05-31 ENCOUNTER — Telehealth: Payer: Self-pay | Admitting: *Deleted

## 2019-05-31 NOTE — Telephone Encounter (Signed)
  Dr Lollie Sails office returned call and stated that she would have the surgery scheduler refax the form that was supposed to be sent with the letter for the surgical clearance.

## 2019-05-31 NOTE — Telephone Encounter (Signed)
Dr. 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   Can he hold ASA for 5 days?  Will have Dr.McKeown address coumadin- he follows.  Please send recommendation to pre-op pool.  thanks [place]

## 2019-05-31 NOTE — Telephone Encounter (Signed)
OK to hold ASA for 5-7 days prior to neck operation.

## 2019-05-31 NOTE — Telephone Encounter (Signed)
Dr. Elmarie Shiley nurse Kelby Aline RN came to me today with what seems to be an office note from Dr. Donnella Bi office. She asked if I would please clarify with Dr. Donnella Bi office if in this office note they are asking for cardiac clearance for a possible surgery. I have left a message for Dr. Lollie Sails CMA to please return my call to confirm if needing clearance.

## 2019-05-31 NOTE — Telephone Encounter (Signed)
   [facility] Pre-operative Risk Assessment    Request for surgical clearance:  1. What type of surgery is being performed? CERVICAL FUSION   2. When is this surgery scheduled? TBD   3. What type of clearance is required (medical clearance vs. Pharmacy clearance to hold med vs. Both)? BOTH  4. Are there any medications that need to be held prior to surgery and how long? ASA AND WARFARIN   5. Practice name and name of physician performing surgery? [place]; DR. Herbie Baltimore NUDELMAN   6. What is your office phone number [phone] EXT [phone]   7.   What is your office fax number [phone]   8.   Anesthesia type (None, local, MAC, general) ? GENERAL   Julaine Hua 05/31/2019, 4:13 PM  _________________________________________________________________   (provider comments below)

## 2019-06-01 NOTE — Telephone Encounter (Signed)
When I called pt to review surgery and his condition, he told me he was not having the MRI looked good to PT alone!   Will remove form pre-op pool

## 2019-06-04 ENCOUNTER — Ambulatory Visit: Payer: Medicare Other | Admitting: Physical Therapy

## 2019-06-08 ENCOUNTER — Ambulatory Visit: Payer: Medicare Other | Admitting: Adult Health Nurse Practitioner

## 2019-06-09 NOTE — Progress Notes (Signed)
Assessment and Plan:   Robert Arnold was seen today for follow-up.  Diagnoses and all orders for this visit:  PAF (paroxysmal atrial fibrillation) (HCC) Taking Coumadin 5mg  five days a week and 7.5mg  two days a week. Discussed diet Continue same dose every evening, results pendind  -     CBC with Differential/Platelet -     Protime-INR  Long term (current) use of anticoagulants Acquired thrombocytopenia (HCC) No S & S of bleeding Discussed hospital precautions Continue to monitor  Senile purpura ([place]) Discussed process, protect skin, sunscreen  Chronic systolic heart failure (HCC) CHF Disease process and medications discussed. Questions answered fully. Emphasized salt restriction, less than 2000mg  a day. Encouraged daily monitoring of the patient's weight, call office if 5 lb weight loss or gain in a day.  Encouraged regular exercise. If any increasing shortness of breath, swelling, or chest pressure go to ER immediately.  decrease your fluid intake to less than 2 L daily please remember to always increase your potassium intake with any increase of your fluid pill.   Type 2 diabetes mellitus with stage 2 chronic kidney disease, with long-term current use of insulin (HCC) Continue insulin 70/30 current regiment BID Discussed general issues about diabetes pathophysiology and management., Educational material distributed., Suggested low cholesterol diet., Encouraged aerobic exercise., Discussed foot care., Reminded to get yearly retinal exam.  CKD stage 2 due to type 2 diabetes mellitus ([place]) Increase fluids  Avoid NSAIDS Blood pressure control Monitor sugars  Will continue to monitor -     COMPLETE METABOLIC PANEL WITH GFR  Depression, major, in remission ([place]) Continue medications: Wellbutrin 300mg  Discussed stress management techniques  Discussed, increase water,intake & good sleep hygiene  Discussed increasing exercise & vegetables in diet  Primary insomnia Doing well at  this time Has taken trazodone in the past PRN  Medication management Continued   Further disposition pending results of labs. Discussed med's effects and SE's.   Over 30 minutes of exam, counseling, chart review, and critical decision making was performed.   Future Appointments  Date Time Provider [place]  07/19/2019  9:30 AM [name], MD [facility] None  09/15/2019 10:00 AM Liane Comber, NP [facility] None  04/18/2020  9:00 AM [name], MD [facility] None    ------------------------------------------------------------------------------------------------------------------  HPI 74 y.o.male presents for follow up on A. Fib on coumadin, CHF, DMII with CKD2, depression, HLD, Insomnia.  BMI is Body mass index is 28.13 kg/m., he is working on diet and exercise. Wt Readings from Last 3 Encounters:  06/10/19 185 lb (83.9 kg)  05/20/19 184 lb (83.5 kg)  05/06/19 186 lb (84.4 kg)    His blood pressure has been controlled at home, today their BP is BP: 118/74  He does workout but reports he has not been walking as much related to the rain and colder temps.. He denies chest pain, shortness of breath, dizziness.  On trazodone 100, 1/2 tablet a night, still has from a year ago.   He has a history of Systolic CHF (EF 62-69%, + dystolic 48/5462), denies dyspnea on exertion, orthopnea, paroxysmal nocturnal dyspnea and edema. Positive for none. Wt Readings from Last 3 Encounters:  06/10/19 185 lb (83.9 kg)  05/20/19 184 lb (83.5 kg)  05/06/19 186 lb (84.4 kg)   Patient is on Coumadin for Afib and taking coumadin, 1 tablet 4 days a week and 1.5 tablets 3 days a week. Patient's last INR is  Lab Results  Component Value Date   INR 2.9 (H) 05/06/2019  INR 1.6 (H) 04/05/2019   INR 2.1 (H) 03/01/2019    Patient denies SOB, CP, dizziness, nose bleeds, easy bleeding, and blood in stool/urine. His coumadin dose was changed last visit. He has not taken ABX, has not  missed any doses and denies a fall. We are monitoring his CBC routinely.  Current dose: 1 tab of coumadin 5 days and 1.5 tabs on 2 days a week.    He is on cholesterol medication and denies myalgias. His cholesterol is not at goal of less than 70. The cholesterol last visit was:   Lab Results  Component Value Date   CHOL 148 03/01/2019   HDL 66 03/01/2019   LDLCALC 66 03/01/2019   TRIG 79 03/01/2019   CHOLHDL 2.2 03/01/2019    He has been working on diet and exercise for prediabetes, and denies paresthesia of the feet, polydipsia, polyuria and visual disturbances. Last A1C in the office was:  Lab Results  Component Value Date   HGBA1C 5.4 03/01/2019   Patient is on Vitamin D supplement.   Lab Results  Component Value Date   VD25OH 94 03/01/2019      Past Medical History:  Diagnosis Date  . Adenomatous colon polyp   . Allergy   . BPH (benign prostatic hyperplasia)   . CHF (congestive heart failure) ([place])   . 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 Diabetes mellitus type 2, controlled ([place])   . FHx: heart disease 02/27/2018  . Glaucoma   . Gout   . 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 MI (myocardial infarction) ([place]) 09/26/2011  . NSTEMI (non-ST elevated myocardial infarction) ([place]) 11/26/2013   2013   . PAF (paroxysmal atrial fibrillation) ([place])    s/p DC-CV in 6/13. Off coumadin due to ureteral bleed     Allergies  Allergen Reactions  . Losartan Anaphylaxis  . Crestor [Rosuvastatin]     Elevated LFT's  . Lorazepam     Pt is unsure of reaction   . Morphine And Related Other (See Comments)    Has no effect on pt      Current Outpatient Medications (Endocrine & Metabolic):  .  insulin NPH-regular Human (NOVOLIN 70/30) (70-30) 100 UNIT/ML injection, Take 20-30 units 2 x day or as directed  Current Outpatient Medications (Cardiovascular):  .  atorvastatin (LIPITOR) 40 MG tablet, Take 1 tablet  Daily for  Cholesterol   Current Outpatient Medications (Analgesics):  .  allopurinol (ZYLOPRIM) 300 MG tablet, Take 1 tablet Daily for Gout Prevention .  aspirin EC 81 MG tablet, Take 81 mg by mouth daily.  Current Outpatient Medications (Hematological):  .  warfarin (COUMADIN) 5 MG tablet, Take 1 to 2 tablets Daily or as directed  Current Outpatient Medications (Other):  Marland Kitchen  Ascorbic Acid (VITAMIN C) 1000 MG tablet, Take 1,000 mg by mouth daily. .  betamethasone dipropionate (DIPROLENE) 0.05 % cream, Apply 1 application topically daily as needed (eczema).  .  Black Pepper-Turmeric (TURMERIC COMPLEX/BLACK PEPPER PO), Take 1,500 mg by mouth daily. Marland Kitchen  buPROPion (WELLBUTRIN XL) 300 MG 24 hr tablet, Take 1 tablet every Morning for Mood .  Calcium Carbonate-Vitamin D (OYSTER SHELL CALCIUM/D) 250-125 MG-UNIT TABS, Take by mouth. .  cyclobenzaprine (FLEXERIL) 10 MG tablet, Take 1 tablet (10 mg total) by mouth 3 (three) times daily as needed for muscle spasms. .  finasteride (PROSCAR) 5 MG tablet, Take 1 tablet Daily for Prostate .  gabapentin (NEURONTIN) 300 MG capsule, Take 1 capsule (300 mg total) by mouth 3 (three) times daily. Marland Kitchen  glucose blood (FREESTYLE TEST STRIPS) test strip, Check blood sugar 3 to 4 times daily for medication regulation. Marland Kitchen  latanoprost (XALATAN) 0.005 % ophthalmic solution, Place 1 drop into both eyes at bedtime.  Marland Kitchen  MAGNESIUM-OXIDE 400 (241.3 Mg) MG tablet, Take 400 mg by mouth 2 (two) times daily.  .  mycophenolate (MYFORTIC) 360 MG TBEC EC tablet, TAKE 1 TABLET BY MOUTH TWICE DAILY .  Probiotic Product (PROBIOTIC DAILY PO), Take 1 capsule by mouth daily. .  tacrolimus (PROGRAF) 0.5 MG capsule, Take 0.5 mg by mouth 2 (two) times daily. .  tacrolimus (PROGRAF) 1 MG capsule, Take 1 mg by mouth 2 (two) times daily. .  traZODone (DESYREL) 50 MG tablet, Take 1 tablet (50 mg total) by mouth at bedtime as needed for sleep. Marland Kitchen  VITAMIN D PO, Take 5,000 Units by mouth. Takes two 5000 unit  capsules on MWF and 1 capsule 4 days a week. .  Zinc 50 MG CAPS, Take 50 mg by mouth daily.  ROS: all negative except above.   Physical Exam:  BP 118/74   Pulse 72   Temp (!) 97.2 F (36.2 C)   Wt 185 lb (83.9 kg)   SpO2 96%   BMI 28.13 kg/m   General Appearance: Well nourished, in no apparent distress. Eyes: PERRLA, EOMs, conjunctiva no swelling or erythema Sinuses: No Frontal/maxillary tenderness ENT/Mouth: Ext aud canals clear, TMs without erythema, bulging. No erythema, swelling, or exudate on post pharynx.  Tonsils not swollen or erythematous. Hearing normal.  Neck: Supple, thyroid normal.  Respiratory: Respiratory effort normal, BS equal bilaterally without rales, rhonchi, wheezing or stridor.  Cardio: Irregularly irregular, 3/6 early systolic blowing murmur. Brisk peripheral pulses, left TP delayed compared to right TP, without edema. Fistula to left wrist intact, slightly engorged without erythema, tenderness, discharge, or warmth. Good distal sensation and cap refill.  Abdomen: Soft, + BS.  Non tender, no guarding, rebound, hernias, masses. Lymphatics: Non tender without lymphadenopathy.  Musculoskeletal: Full ROM, 5/5 strength, normal gait.  Skin: left arm with well healing 5 cm area, no warmth, swelling, erythema. Warm, dry without rashes, lesions, ecchymosis.  Neuro: Cranial nerves intact. Normal muscle tone, no cerebellar symptoms.  Psych: Awake and oriented X 3, normal affect, Insight and Judgment appropriate.     Robert Sierras, NP 9:33 AM [facility]

## 2019-06-10 ENCOUNTER — Ambulatory Visit (INDEPENDENT_AMBULATORY_CARE_PROVIDER_SITE_OTHER): Payer: Medicare Other | Admitting: Adult Health Nurse Practitioner

## 2019-06-10 ENCOUNTER — Other Ambulatory Visit: Payer: Self-pay

## 2019-06-10 ENCOUNTER — Encounter: Payer: Self-pay | Admitting: Adult Health Nurse Practitioner

## 2019-06-10 VITALS — BP 118/74 | HR 72 | Temp 97.2°F | Wt 185.0 lb

## 2019-06-10 DIAGNOSIS — D692 Other nonthrombocytopenic purpura: Secondary | ICD-10-CM

## 2019-06-10 DIAGNOSIS — I480 Paroxysmal atrial fibrillation: Secondary | ICD-10-CM

## 2019-06-10 DIAGNOSIS — Z7901 Long term (current) use of anticoagulants: Secondary | ICD-10-CM

## 2019-06-10 DIAGNOSIS — D696 Thrombocytopenia, unspecified: Secondary | ICD-10-CM

## 2019-06-10 DIAGNOSIS — I5022 Chronic systolic (congestive) heart failure: Secondary | ICD-10-CM

## 2019-06-10 DIAGNOSIS — F5101 Primary insomnia: Secondary | ICD-10-CM

## 2019-06-10 DIAGNOSIS — N182 Chronic kidney disease, stage 2 (mild): Secondary | ICD-10-CM

## 2019-06-10 DIAGNOSIS — F325 Major depressive disorder, single episode, in full remission: Secondary | ICD-10-CM

## 2019-06-10 DIAGNOSIS — Z794 Long term (current) use of insulin: Secondary | ICD-10-CM

## 2019-06-10 DIAGNOSIS — Z79899 Other long term (current) drug therapy: Secondary | ICD-10-CM

## 2019-06-10 DIAGNOSIS — E1122 Type 2 diabetes mellitus with diabetic chronic kidney disease: Secondary | ICD-10-CM

## 2019-06-10 NOTE — Patient Instructions (Signed)
Declined AVS today

## 2019-06-11 LAB — COMPLETE METABOLIC PANEL WITH GFR
AG Ratio: 1.7 (calc) (ref 1.0–2.5)
ALT: 18 U/L (ref 9–46)
AST: 21 U/L (ref 10–35)
Albumin: 3.9 g/dL (ref 3.6–5.1)
Alkaline phosphatase (APISO): 97 U/L (ref 35–144)
BUN/Creatinine Ratio: 29 (calc) — ABNORMAL HIGH (ref 6–22)
BUN: 28 mg/dL — ABNORMAL HIGH (ref 7–25)
CO2: 25 mmol/L (ref 20–32)
Calcium: 9.6 mg/dL (ref 8.6–10.3)
Chloride: 106 mmol/L (ref 98–110)
Creat: 0.98 mg/dL (ref 0.70–1.18)
GFR, Est African American: 90 mL/min/{1.73_m2} (ref >=60)
GFR, Est Non African American: 77 mL/min/{1.73_m2} (ref >=60)
Globulin: 2.3 g/dL (calc) (ref 1.9–3.7)
Glucose, Bld: 110 mg/dL — ABNORMAL HIGH (ref 65–99)
Potassium: 4.1 mmol/L (ref 3.5–5.3)
Sodium: 138 mmol/L (ref 135–146)
Total Bilirubin: 1.3 mg/dL — ABNORMAL HIGH (ref 0.2–1.2)
Total Protein: 6.2 g/dL (ref 6.1–8.1)

## 2019-06-11 LAB — PROTIME-INR
INR: 3.5 — ABNORMAL HIGH
Prothrombin Time: 33.9 s — ABNORMAL HIGH (ref 9.0–11.5)

## 2019-06-11 LAB — CBC WITH DIFFERENTIAL/PLATELET
Absolute Monocytes: 412 cells/uL (ref 200–950)
Basophils Absolute: 59 cells/uL (ref 0–200)
Basophils Relative: 1.4 %
Eosinophils Absolute: 71 cells/uL (ref 15–500)
Eosinophils Relative: 1.7 %
HCT: 43.9 % (ref 38.5–50.0)
Hemoglobin: 15 g/dL (ref 13.2–17.1)
Lymphs Abs: 1109 cells/uL (ref 850–3900)
MCH: 31.5 pg (ref 27.0–33.0)
MCHC: 34.2 g/dL (ref 32.0–36.0)
MCV: 92.2 fL (ref 80.0–100.0)
MPV: 9.7 fL (ref 7.5–12.5)
Monocytes Relative: 9.8 %
Neutro Abs: 2549 cells/uL (ref 1500–7800)
Neutrophils Relative %: 60.7 %
Platelets: 135 10*3/uL — ABNORMAL LOW (ref 140–400)
RBC: 4.76 10*6/uL (ref 4.20–5.80)
RDW: 12.5 % (ref 11.0–15.0)
Total Lymphocyte: 26.4 %
WBC: 4.2 10*3/uL (ref 3.8–10.8)

## 2019-06-11 NOTE — Progress Notes (Signed)
-  Blood count is in normal range.  -Electrolytes, kidney and liver function are in normal range.  -INR is 3.5, just a touch high.  We are going to reduce your dose.  Take Warfarin (Coumadin) one tablet, 5mg , six days a week and one and half tablets, 7.5mg , one day a week.  Let us know if you have any questions or concerns.   Sincerely,        Garnet Sierras, NP

## 2019-07-19 ENCOUNTER — Ambulatory Visit (INDEPENDENT_AMBULATORY_CARE_PROVIDER_SITE_OTHER): Payer: Medicare Other | Admitting: Internal Medicine

## 2019-07-19 ENCOUNTER — Other Ambulatory Visit: Payer: Self-pay

## 2019-07-19 VITALS — BP 116/72 | HR 68 | Temp 96.9°F | Resp 16 | Ht 68.0 in | Wt 187.6 lb

## 2019-07-19 DIAGNOSIS — I480 Paroxysmal atrial fibrillation: Secondary | ICD-10-CM

## 2019-07-19 DIAGNOSIS — N182 Chronic kidney disease, stage 2 (mild): Secondary | ICD-10-CM

## 2019-07-19 DIAGNOSIS — M1000 Idiopathic gout, unspecified site: Secondary | ICD-10-CM

## 2019-07-19 DIAGNOSIS — D6859 Other primary thrombophilia: Secondary | ICD-10-CM

## 2019-07-19 DIAGNOSIS — E785 Hyperlipidemia, unspecified: Secondary | ICD-10-CM

## 2019-07-19 DIAGNOSIS — Z79899 Other long term (current) drug therapy: Secondary | ICD-10-CM

## 2019-07-19 DIAGNOSIS — I25708 Atherosclerosis of coronary artery bypass graft(s), unspecified, with other forms of angina pectoris: Secondary | ICD-10-CM

## 2019-07-19 DIAGNOSIS — Z794 Long term (current) use of insulin: Secondary | ICD-10-CM

## 2019-07-19 DIAGNOSIS — E1169 Type 2 diabetes mellitus with other specified complication: Secondary | ICD-10-CM | POA: Diagnosis not present

## 2019-07-19 DIAGNOSIS — E1122 Type 2 diabetes mellitus with diabetic chronic kidney disease: Secondary | ICD-10-CM

## 2019-07-19 DIAGNOSIS — I10 Essential (primary) hypertension: Secondary | ICD-10-CM | POA: Diagnosis not present

## 2019-07-19 DIAGNOSIS — Z7901 Long term (current) use of anticoagulants: Secondary | ICD-10-CM

## 2019-07-19 DIAGNOSIS — E559 Vitamin D deficiency, unspecified: Secondary | ICD-10-CM

## 2019-07-19 NOTE — Progress Notes (Signed)
History of Present Illness:       This very nice 74 y.o.male presents for  follow up with HTN, ASHD/  CABG/ pAfib, s/p Kidney Transplant (2013),  HLD, Pre-Diabetes and Vitamin D Deficiency and was seen by the Nurse today. Patient is on Allopurinol for control of his Gout. Patient has hx/o Depression in Remission on Bupropion.      Patient is treated for HTN (1998) & BP has been controlled at home. Today's BP is at goal- 116/72.  In Nov 2013, patient had CABG and Post-Op he had pAfib & and has since been on Warfarin. Patient has had no complaints of any cardiac type chest pain, palpitations, dyspnea / orthopnea / PND, dizziness, claudication, or dependent edema.      Patient has hx/o ESRD (IgA nephropathy circa 1993) and started on dialysis (Nov 2011) til living Donor Kidney Transplant in June 2013 from a step Grandson. Post-op, his kidney transplant, he had acute MI with PCS/Stenting.       Hyperlipidemia is controlled with diet & Atorvastatin. Patient denies myalgias or other med SE's/p. Last Lipids were at goal:  Lab Results  Component Value Date   CHOL 127 07/19/2019   HDL 58 07/19/2019   LDLCALC 56 07/19/2019   TRIG 54 07/19/2019   CHOLHDL 2.2 07/19/2019    Also, the patient has history of Insulin Requiring T2_NIDDM (2003) and managed on diet til starting Metformin in 2007, then insulin after his kidney transplant. He has had no symptoms of reactive hypoglycemia, diabetic polys, paresthesias or visual blurring.  Last A1c was at goal:  Lab Results  Component Value Date   HGBA1C 5.5 07/19/2019       Further, the patient also has history of Vitamin D Deficiency ("24" / 2014) and supplements vitamin D without any suspected side-effects. Last vitamin D was at goal:  Lab Results  Component Value Date   VD25OH 63 07/19/2019    Current Outpatient Medications on File Prior to Visit  Medication Sig  . allopurinol (ZYLOPRIM) 300 MG tablet Take 1 tablet Daily for Gout Prevention    . Ascorbic Acid (VITAMIN C) 1000 MG tablet Take 1,000 mg by mouth daily.  Marland Kitchen aspirin EC 81 MG tablet Take 81 mg by mouth daily.  Marland Kitchen atorvastatin (LIPITOR) 40 MG tablet Take 1 tablet  Daily for Cholesterol  . betamethasone dipropionate (DIPROLENE) 0.05 % cream Apply 1 application topically daily as needed (eczema).   . Black Pepper-Turmeric (TURMERIC COMPLEX/BLACK PEPPER PO) Take 1,500 mg by mouth daily.  Marland Kitchen buPROPion (WELLBUTRIN XL) 300 MG 24 hr tablet Take 1 tablet every Morning for Mood  . Calcium Carbonate-Vitamin D (OYSTER SHELL CALCIUM/D) 250-125 MG-UNIT TABS Take by mouth.  . cyclobenzaprine (FLEXERIL) 10 MG tablet Take 1 tablet (10 mg total) by mouth 3 (three) times daily as needed for muscle spasms.  . finasteride (PROSCAR) 5 MG tablet Take 1 tablet Daily for Prostate  . gabapentin (NEURONTIN) 300 MG capsule Take 1 capsule (300 mg total) by mouth 3 (three) times daily.  Marland Kitchen glucose blood (FREESTYLE TEST STRIPS) test strip Check blood sugar 3 to 4 times daily for medication regulation.  . insulin NPH-regular Human (NOVOLIN 70/30) (70-30) 100 UNIT/ML injection Take 20-30 units 2 x day or as directed  . latanoprost (XALATAN) 0.005 % ophthalmic solution Place 1 drop into both eyes at bedtime.   Marland Kitchen MAGNESIUM-OXIDE 400 (241.3 Mg) MG tablet Take 400 mg by mouth 2 (two) times daily.   Marland Kitchen  mycophenolate (MYFORTIC) 360 MG TBEC EC tablet TAKE 1 TABLET BY MOUTH TWICE DAILY  . Probiotic Product (PROBIOTIC DAILY PO) Take 1 capsule by mouth daily.  . tacrolimus (PROGRAF) 0.5 MG capsule Take 0.5 mg by mouth 2 (two) times daily.  . tacrolimus (PROGRAF) 1 MG capsule Take 1 mg by mouth 2 (two) times daily.  . traZODone (DESYREL) 50 MG tablet Take 1 tablet (50 mg total) by mouth at bedtime as needed for sleep.  Marland Kitchen VITAMIN D PO Take 5,000 Units by mouth. Takes two 5000 unit capsules on MWF and 1 capsule 4 days a week.  . warfarin (COUMADIN) 5 MG tablet Take 1 to 2 tablets Daily or as directed  . Zinc 50 MG CAPS  Take 50 mg by mouth daily.   No current facility-administered medications on file prior to visit.    Allergies  Allergen Reactions  . Losartan Anaphylaxis  . Crestor [Rosuvastatin]     Elevated LFT's  . Lorazepam     Pt is unsure of reaction   . Morphine And Related Other (See Comments)    Has no effect on pt    PMHx:   Past Medical History:  Diagnosis Date  . Adenomatous colon polyp   . Allergy   . BPH (benign prostatic hyperplasia)   . CHF (congestive heart failure) ([place])   . 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 Diabetes mellitus type 2, controlled ([place])   . FHx: heart disease 02/27/2018  . Glaucoma   . Gout   . 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 MI (myocardial infarction) ([place]) 09/26/2011  . NSTEMI (non-ST elevated myocardial infarction) ([place]) 11/26/2013   2013   . PAF (paroxysmal atrial fibrillation) ([place])    s/p DC-CV in 6/13. Off coumadin due to ureteral bleed    Immunization History  Administered Date(s) Administered  . DT (Pediatric) 06/05/2015  . Hepatitis B 04/08/2009  . Influenza, High Dose Seasonal PF 12/27/2013, 01/17/2015, 01/11/2016, 01/02/2018, 12/21/2018  . Influenza-Unspecified 12/25/2016  . Pneumococcal Conjugate-13 10/25/2013  . Pneumococcal Polysaccharide-23 06/05/2015  . Pneumococcal-Unspecified 04/08/2008  . Tdap 04/08/2008  . Zoster Recombinat (Shingrix) 02/12/2018, 06/02/2018    Past Surgical History:  Procedure Laterality Date  . ANTERIOR CERVICAL DECOMP/DISCECTOMY FUSION N/A 05/04/2018   Procedure: Cervical Five-Six Anterior cervical decompression/discectomy/fusion;  Surgeon: Jovita Gamma, MD;  Location: [facility];  Service: Neurosurgery;  Laterality: N/A;  Cervical Five-Six Anterior cervical decompression/discectomy/fusion  . AV FISTULA PLACEMENT  2011   Left forearm  . COLONOSCOPY    . CORONARY ANGIOPLASTY WITH STENT PLACEMENT    . CORONARY ARTERY BYPASS GRAFT  2013  .  KIDNEY TRANSPLANT  09/25/2011  . PERITONEAL CATHETER INSERTION  2011   Systems Review:      Neg per Nurse Review.  Physical Exam  BP 116/72   Pulse 68   Temp (!) 96.9 F (36.1 C)   Resp 16   Ht 5\' 8"  (1.727 m)   Wt 187 lb 9.6 oz (85.1 kg)   BMI 28.52 kg/m   Appeared  in no distress.  Assessment and Plan:  1. Essential hypertension  - Continue medication, monitor blood pressure at home.  - Continue DASH diet.  Reminder to go to the ER if any CP,  SOB, nausea, dizziness, severe HA, changes vision/speech.  - CBC with Differential/Platelet - COMPLETE METABOLIC PANEL WITH GFR - Magnesium - TSH  2. Hyperlipidemia associated with type 2 diabetes  mellitus ([place])  - Continue diet/meds, exercise,& lifestyle modifications.  - Continue monitor periodic cholesterol/liver & renal functions   - Lipid panel - TSH  3. Type 2 diabetes mellitus with stage 2 chronic kidney disease,  with long-term current use of insulin (HCC)  - Hemoglobin A1c  4. Vitamin D deficiency  - Continue diet, exercise  - Lifestyle modifications.  - Monitor appropriate labs. - Continue supplementation.  - VITAMIN D 25 Hydroxyl  5. PAF (paroxysmal atrial fibrillation) (HCC)  - TSH  6. 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 Thrombophilia ([place])  - Protime-INR  8. Idiopathic gout  - Uric acid  9. Anticoagulant long-term use - Protime-INR  10. Medication management  - CBC with Differential/Platelet - COMPLETE METABOLIC PANEL WITH GFR - Magnesium - Lipid panel - TSH - Hemoglobin A1c - VITAMIN D 25 Hydroxy        Recommended labs to assess and monitor clinical status with further disposition pending results of labs.   The patient was provided an opportunity to ask questions. The patient agreed with the plan and demonstrated an understanding of the instructions.   Kirtland Bouchard, MD

## 2019-07-19 NOTE — Patient Instructions (Signed)
Due to recent changes in healthcare laws, you may see the results of your imaging and laboratory studies on MyChart before your provider has had a chance to review them.  We understand that in some cases there may be results that are confusing or concerning to you. Not all laboratory results come back in the same time frame and the provider may be waiting for multiple results in order to interpret others.  Please give us 48 hours in order for your provider to thoroughly review all the results before contacting the office for clarification of your results.     ++++++++++++++++++++++++++++++++++  Vit D  & Vit C 1,000 mg   are recommended to help protect  against the Covid-19 and other Corona viruses.    Also it's recommended  to take  Zinc 50 mg  to help  protect against the Covid-19   and best place to get  is also on [employer]  and don't pay more than 6-8 cents /pill !   ===================================== Coronavirus (COVID-19) Are you at risk?  Are you at risk for the Coronavirus (COVID-19)?  To be considered HIGH RISK for Coronavirus (COVID-19), you have to meet the following criteria:  . Traveled to [place], [place], [place], [place] or [place]; or in the [place] to [place], [place], [place]  . or [place]; and have fever, cough, and shortness of breath within the last 2 weeks of travel OR . Been in close contact with a person diagnosed with COVID-19 within the last 2 weeks and have  . fever, cough,and shortness of breath .  . IF YOU DO NOT MEET THESE CRITERIA, YOU ARE CONSIDERED LOW RISK FOR COVID-19.  What to do if you are HIGH RISK for COVID-19?  . If you are having a medical emergency, call 911. . Seek medical care right away. Before you go to a doctor's office, urgent care or emergency department, .  call ahead and tell them about your recent travel, contact with someone diagnosed with COVID-19  .  and your symptoms.  . You should receive instructions  from your physician's office regarding next steps of care.  . When you arrive at healthcare provider, tell the healthcare staff immediately you have returned from  . visiting [place], [place], [place], [place] or [place]; or traveled in the [place] to [place], [place],  . [place] or [place] in the last two weeks or you have been in close contact with a person diagnosed with  . COVID-19 in the last 2 weeks.   . Tell the health care staff about your symptoms: fever, cough and shortness of breath. . After you have been seen by a medical provider, you will be either: o Tested for (COVID-19) and discharged home on quarantine except to seek medical care if  o symptoms worsen, and asked to  - Stay home and avoid contact with others until you get your results (4-5 days)  - Avoid travel on public transportation if possible (such as bus, train, or airplane) or o Sent to the Emergency Department by EMS for evaluation, COVID-19 testing  and  o possible admission depending on your condition and test results.  What to do if you are LOW RISK for COVID-19?  Reduce your risk of any infection by using the same precautions used for avoiding the common cold or flu:  . Wash your hands often with soap and warm water for at least 20 seconds.  If soap and water   are not readily available,  . use an alcohol-based hand sanitizer with at least 60% alcohol.  . If coughing or sneezing, cover your mouth and nose by coughing or sneezing into the elbow areas of your shirt or coat, .  into a tissue or into your sleeve (not your hands). . Avoid shaking hands with others and consider head nods or verbal greetings only. . Avoid touching your eyes, nose, or mouth with unwashed hands.  . Avoid close contact with people who are sick. . Avoid places or events with large numbers of people in one location, like concerts or sporting events. . Carefully consider travel plans you have or are making. . If you are planning  any travel outside or inside the [place], visit the CDC's Travelers' Health webpage for the latest health notices. . If you have some symptoms but not all symptoms, continue to monitor at home and seek medical attention  . if your symptoms worsen. . If you are having a medical emergency, call 911.   ++++++++++++++++++++++++++++++++ Recommend Adult Low Dose Aspirin or  coated  Aspirin 81 mg daily  To reduce risk of Colon Cancer 40 %,  Skin Cancer 26 % ,  Melanoma 46%  and  Pancreatic cancer 60% ++++++++++++++++++++++++++++++++ Vitamin D goal  is between 70-100.  Please make sure that you are taking your Vitamin D as directed.  It is very important as a natural anti-inflammatory  helping hair, skin, and nails, as well as reducing stroke and heart attack risk.  It helps your bones and helps with mood. It also decreases numerous cancer risks so please take it as directed.  Low Vit D is associated with a 200-300% higher risk for CANCER  and 200-300% higher risk for HEART   ATTACK  &  STROKE.   ...................................... It is also associated with higher death rate at younger ages,  autoimmune diseases like Rheumatoid arthritis, Lupus, Multiple Sclerosis.    Also many other serious conditions, like depression, Alzheimer's Dementia, infertility, muscle aches, fatigue, fibromyalgia - just to name a few. ++++++++++++++++++++ Recommend the book "The END of DIETING" by Dr Joel Fuhrman  & the book "The END of DIABETES " by Dr Joel Fuhrman At [employer] - get book & Audio CD's    Being diabetic has a  300% increased risk for heart attack, stroke, cancer, and alzheimer- type vascular dementia. It is very important that you work harder with diet by avoiding all foods that are white. Avoid white rice (brown & wild rice is OK), white potatoes (sweetpotatoes in moderation is OK), White bread or wheat bread or anything made out of white flour like bagels, donuts, rolls, buns, biscuits, cakes,  pastries, cookies, pizza crust, and pasta (made from white flour & egg whites) - vegetarian pasta or spinach or wheat pasta is OK. Multigrain breads like Arnold's or Pepperidge [employer], or multigrain sandwich thins or flatbreads.  Diet, exercise and weight loss can reverse and cure diabetes in the early stages.  Diet, exercise and weight loss is very important in the control and prevention of complications of diabetes which affects every system in your body, ie. Brain - dementia/stroke, eyes - glaucoma/blindness, heart - heart attack/heart failure, kidneys - dialysis, stomach - gastric paralysis, intestines - malabsorption, nerves - severe painful neuritis, circulation - gangrene & loss of a leg(s), and finally cancer and Alzheimers.    I recommend avoid fried & greasy foods,  sweets/candy, white rice (brown or wild rice or Quinoa is OK), white potatoes (  sweet potatoes are OK) - anything made from white flour - bagels, doughnuts, rolls, buns, biscuits,white and wheat breads, pizza crust and traditional pasta made of white flour & egg white(vegetarian pasta or spinach or wheat pasta is OK).  Multi-grain bread is OK - like multi-grain flat bread or sandwich thins. Avoid alcohol in excess. Exercise is also important.    Eat all the vegetables you want - avoid meat, especially red meat and dairy - especially cheese.  Cheese is the most concentrated form of trans-fats which is the worst thing to clog up our arteries. Veggie cheese is OK which can be found in the fresh produce section at [employer] or [employer] or [employer]  +++++++++++++++++++++ DASH Eating Plan  DASH stands for "Dietary Approaches to Stop Hypertension."   The DASH eating plan is a healthy eating plan that has been shown to reduce high blood pressure (hypertension). Additional health benefits may include reducing the risk of type 2 diabetes mellitus, heart disease, and stroke. The DASH eating plan may also help with weight loss. WHAT DO I  NEED TO KNOW ABOUT THE DASH EATING PLAN? For the DASH eating plan, you will follow these general guidelines:  Choose foods with a percent daily value for sodium of less than 5% (as listed on the food label).  Use salt-free seasonings or herbs instead of table salt or sea salt.  Check with your health care provider or pharmacist before using salt substitutes.  Eat lower-sodium products, often labeled as "lower sodium" or "no salt added."  Eat fresh foods.  Eat more vegetables, fruits, and low-fat dairy products.  Choose whole grains. Look for the word "whole" as the first word in the ingredient list.  Choose fish   Limit sweets, desserts, sugars, and sugary drinks.  Choose heart-healthy fats.  Eat veggie cheese   Eat more home-cooked food and less restaurant, buffet, and fast food.  Limit fried foods.  Cook foods using methods other than frying.  Limit canned vegetables. If you do use them, rinse them well to decrease the sodium.  When eating at a restaurant, ask that your food be prepared with less salt, or no salt if possible.                      WHAT FOODS CAN I EAT? Read Dr Joel Fuhrman's books on The End of Dieting & The End of Diabetes  Grains Whole grain or whole wheat bread. Brown rice. Whole grain or whole wheat pasta. Quinoa, bulgur, and whole grain cereals. Low-sodium cereals. Corn or whole wheat flour tortillas. Whole grain cornbread. Whole grain crackers. Low-sodium crackers.  Vegetables Fresh or frozen vegetables (raw, steamed, roasted, or grilled). Low-sodium or reduced-sodium tomato and vegetable juices. Low-sodium or reduced-sodium tomato sauce and paste. Low-sodium or reduced-sodium canned vegetables.   Fruits All fresh, canned (in natural juice), or frozen fruits.  Protein Products  All fish and seafood.  Dried beans, peas, or lentils. Unsalted nuts and seeds. Unsalted canned beans.  Dairy Low-fat dairy products, such as skim or 1% milk, 2% or  reduced-fat cheeses, low-fat ricotta or cottage cheese, or plain low-fat yogurt. Low-sodium or reduced-sodium cheeses.  Fats and Oils Tub margarines without trans fats. Light or reduced-fat mayonnaise and salad dressings (reduced sodium). Avocado. Safflower, olive, or canola oils. Natural peanut or almond butter.  Other Unsalted popcorn and pretzels. The items listed above may not be a complete list of recommended foods or beverages. Contact your dietitian for more   options.  +++++++++++++++  WHAT FOODS ARE NOT RECOMMENDED? Grains/ White flour or wheat flour White bread. White pasta. White rice. Refined cornbread. Bagels and croissants. Crackers that contain trans fat.  Vegetables  Creamed or fried vegetables. Vegetables in a . Regular canned vegetables. Regular canned tomato sauce and paste. Regular tomato and vegetable juices.  Fruits Dried fruits. Canned fruit in light or heavy syrup. Fruit juice.  Meat and Other Protein Products Meat in general - RED meat & White meat.  Fatty cuts of meat. Ribs, chicken wings, all processed meats as bacon, sausage, bologna, salami, fatback, hot dogs, bratwurst and packaged luncheon meats.  Dairy Whole or 2% milk, cream, half-and-half, and cream cheese. Whole-fat or sweetened yogurt. Full-fat cheeses or blue cheese. Non-dairy creamers and whipped toppings. Processed cheese, cheese spreads, or cheese curds.  Condiments Onion and garlic salt, seasoned salt, table salt, and sea salt. Canned and packaged gravies. Worcestershire sauce. Tartar sauce. Barbecue sauce. Teriyaki sauce. Soy sauce, including reduced sodium. Steak sauce. Fish sauce. Oyster sauce. Cocktail sauce. Horseradish. Ketchup and mustard. Meat flavorings and tenderizers. Bouillon cubes. Hot sauce. Tabasco sauce. Marinades. Taco seasonings. Relishes.  Fats and Oils Butter, stick margarine, lard, shortening and bacon fat. Coconut, palm kernel, or palm oils. Regular salad  dressings.  Pickles and olives. Salted popcorn and pretzels.  The items listed above may not be a complete list of foods and beverages to avoid.   Bleeding Precautions When on Anticoagulant Therapy  Anticoagulant therapy, also called blood thinner therapy, is medicine that helps to prevent and treat blood clots. The medicine works by stopping blood clots from forming or growing. Blood clots that form in your blood vessels can be dangerous. They can break loose and travel to the heart, lungs, or brain. This increases the risk of a heart attack, stroke, or blocked lung artery (pulmonary embolism). Anticoagulants also increase the risk of bleeding. Try to protect yourself from cuts and other injuries that can cause bleeding. It is important to take anticoagulants exactly as told by your health care provider.  Why do I need to be on anticoagulant therapy?  You may need this medicine if you are at risk of developing a blood clot. Conditions that increase your risk of a blood clot include:  Being born with heart disease or a heart malformation (congenital heart disease).  Developing heart disease.  Having had surgery, such as valve replacement.  Having had a serious accident or other type of severe injury (trauma).  Having certain types of cancer.  Having certain diseases that can increase blood clotting.  Having a high risk of stroke or heart attack.  Having atrial fibrillation (AF).   What are the common anticoagulant medicines?  There are several types of anticoagulant medicines. The most common types is:   Medicines that you take by mouth (oral medicines), such as: ? Warfarin.   Injections, such as: ? Unfractionated heparin. ? Low molecular weight heparin. These anticoagulants work in different ways to prevent blood clots. They also have different risks and side effects.  What do I need to remember while on anticoagulant therapy?   Taking anticoagulants  Take your  medicine at the same time every day. If you forget to take your medicine, take it as soon as you remember. Do not double your dosage of medicine if you miss a whole day. Take your normal dose and call your health care provider. Do not stop taking your medicine unless your health care provider approves. Stopping the medicine  can increase your risk of developing a blood clot.  Taking other medicines   Take over-the-counter and prescriptions medicines only as told by your health care provider.  Do not take over-the-counter NSAIDs, including aspirin and ibuprofen, while you are on anticoagulant therapy. These medicines increase your risk of dangerous bleeding.  Get approval from your health care provider before you start taking any new medicines, vitamins, or herbal products. Some of these could interfere with your therapy.   General instructions   Keep all follow-up visits as told by your health care provider. This is important.  If you are pregnant or trying to get pregnant, talk with a health care provider about anticoagulants. Some of these medicines are not safe to take during pregnancy.  Tell all health care providers, including your dentist, that you are on anticoagulant therapy. It is especially important to tell providers before you have any surgery, medical procedures, or dental work done.   What precautions should I take?   Be very careful when using knives, scissors, or other sharp objects.  Use an electric razor instead of a blade.  Do not use toothpicks.  Use a soft-bristled toothbrush. Brush your teeth gently.  Always wear shoes outdoors and wear slippers indoors.  Be careful when cutting your fingernails and toenails.  Place bath mats in the bathroom. If possible, install handrails as well.  Wear gloves while you do yard work.  Wear your seat belt.  Prevent falls by removing loose rugs and extension cords from areas where you walk. Use a cane or walker if you  need it.  Avoid constipation by: ? Drinking enough fluid to keep your urine clear or pale yellow. ? Eating foods that are high in fiber, such as fresh fruits and vegetables, whole grains, and beans. ? Limiting foods that are high in fat and processed sugars, such as fried and sweet foods.  Do not play contact sports or participate in other activities that have a high risk for injury.   What other precautions are important if on warfarin therapy? If you are taking a type of anticoagulant called warfarin, make sure you:  Work with a diet and nutrition specialist (dietitian) to make an eating plan. Do not make any sudden changes to your diet after you have started your eating plan.  Do not drink alcohol. It can interfere with your medicine and increase your risk of an injury that causes bleeding.  Get regular blood tests as told by your health care provider.   What are some questions to ask my health care provider?   Why do I need anticoagulant therapy?  What is the best anticoagulant therapy for my condition?  How long will I need anticoagulant therapy?  What are the side effects of anticoagulant therapy?  When should I take my medicine? What should I do if I forget to take it?  Will I need to have regular blood tests?  Do I need to change my diet? Are there foods or drinks that I should avoid?  What activities are safe for me?   Contact a health care provider if:   You miss a dose of medicine: ? And you are not sure what to do. ? For more than one day.  You have: ? Menstrual bleeding that is heavier than normal. ? Bloody or brown urine. ? Easy bruising. ? Black and tarry stool or bright red stool. ? Side effects from your medicine.  You feel weak or dizzy.  Get help right away if:    You have bleeding that will not stop within 20 minutes from: ? The nose. ? The gums. ? A cut on the skin.  You have a severe headache or stomachache.  You vomit or  cough up blood.  You fall or hit your head.   Summary   Anticoagulant therapy, also called blood thinner therapy, is medicine that helps to prevent and treat blood clots.  Anticoagulants work in different ways to prevent blood clots. They also have different risks and side effects.  Talk with your health care provider about any precautions that you should take while on anticoagulant therapy.

## 2019-07-20 ENCOUNTER — Encounter: Payer: Self-pay | Admitting: Internal Medicine

## 2019-07-20 LAB — CBC WITH DIFFERENTIAL/PLATELET
Absolute Monocytes: 405 cells/uL (ref 200–950)
Basophils Absolute: 50 cells/uL (ref 0–200)
Basophils Relative: 1.1 %
Eosinophils Absolute: 59 cells/uL (ref 15–500)
Eosinophils Relative: 1.3 %
HCT: 44.2 % (ref 38.5–50.0)
Hemoglobin: 14.5 g/dL (ref 13.2–17.1)
Lymphs Abs: 1013 cells/uL (ref 850–3900)
MCH: 30.7 pg (ref 27.0–33.0)
MCHC: 32.8 g/dL (ref 32.0–36.0)
MCV: 93.6 fL (ref 80.0–100.0)
MPV: 9.8 fL (ref 7.5–12.5)
Monocytes Relative: 9 %
Neutro Abs: 2975 cells/uL (ref 1500–7800)
Neutrophils Relative %: 66.1 %
Platelets: 137 10*3/uL — ABNORMAL LOW (ref 140–400)
RBC: 4.72 10*6/uL (ref 4.20–5.80)
RDW: 12.2 % (ref 11.0–15.0)
Total Lymphocyte: 22.5 %
WBC: 4.5 10*3/uL (ref 3.8–10.8)

## 2019-07-20 LAB — PROTIME-INR
INR: 2.4 — ABNORMAL HIGH
Prothrombin Time: 24.9 s — ABNORMAL HIGH (ref 9.0–11.5)

## 2019-07-20 LAB — COMPLETE METABOLIC PANEL WITH GFR
AG Ratio: 1.7 (calc) (ref 1.0–2.5)
ALT: 28 U/L (ref 9–46)
AST: 24 U/L (ref 10–35)
Albumin: 3.8 g/dL (ref 3.6–5.1)
Alkaline phosphatase (APISO): 110 U/L (ref 35–144)
BUN: 22 mg/dL (ref 7–25)
CO2: 28 mmol/L (ref 20–32)
Calcium: 9.3 mg/dL (ref 8.6–10.3)
Chloride: 106 mmol/L (ref 98–110)
Creat: 0.94 mg/dL (ref 0.70–1.18)
GFR, Est African American: 94 mL/min/{1.73_m2} (ref >=60)
GFR, Est Non African American: 81 mL/min/{1.73_m2} (ref >=60)
Globulin: 2.2 g/dL (calc) (ref 1.9–3.7)
Glucose, Bld: 122 mg/dL — ABNORMAL HIGH (ref 65–99)
Potassium: 4.2 mmol/L (ref 3.5–5.3)
Sodium: 141 mmol/L (ref 135–146)
Total Bilirubin: 0.9 mg/dL (ref 0.2–1.2)
Total Protein: 6 g/dL — ABNORMAL LOW (ref 6.1–8.1)

## 2019-07-20 LAB — LIPID PANEL
Cholesterol: 127 mg/dL (ref <200)
HDL: 58 mg/dL (ref >=40)
LDL Cholesterol (Calc): 56 mg/dL (calc)
Non-HDL Cholesterol (Calc): 69 mg/dL (calc) (ref <130)
Total CHOL/HDL Ratio: 2.2 (calc) (ref <5.0)
Triglycerides: 54 mg/dL (ref <150)

## 2019-07-20 LAB — HEMOGLOBIN A1C
Hgb A1c MFr Bld: 5.5 % of total Hgb (ref <5.7)
Mean Plasma Glucose: 111 (calc)
eAG (mmol/L): 6.2 (calc)

## 2019-07-20 LAB — VITAMIN D 25 HYDROXY (VIT D DEFICIENCY, FRACTURES): Vit D, 25-Hydroxy: 80 ng/mL (ref 30–100)

## 2019-07-20 LAB — TSH: TSH: 2.24 mIU/L (ref 0.40–4.50)

## 2019-07-20 LAB — URIC ACID: Uric Acid, Serum: 3.8 mg/dL — ABNORMAL LOW (ref 4.0–8.0)

## 2019-07-20 LAB — MAGNESIUM: Magnesium: 1.7 mg/dL (ref 1.5–2.5)

## 2019-08-15 ENCOUNTER — Other Ambulatory Visit: Payer: Self-pay | Admitting: Physician Assistant

## 2019-08-15 DIAGNOSIS — M542 Cervicalgia: Secondary | ICD-10-CM

## 2019-08-16 ENCOUNTER — Ambulatory Visit: Payer: Medicare Other | Admitting: Internal Medicine

## 2019-08-19 ENCOUNTER — Other Ambulatory Visit: Payer: Self-pay

## 2019-08-19 ENCOUNTER — Ambulatory Visit (INDEPENDENT_AMBULATORY_CARE_PROVIDER_SITE_OTHER): Payer: Medicare Other | Admitting: Adult Health Nurse Practitioner

## 2019-08-19 ENCOUNTER — Encounter: Payer: Self-pay | Admitting: Adult Health Nurse Practitioner

## 2019-08-19 VITALS — BP 114/66 | HR 64 | Temp 96.9°F | Resp 18 | Ht 68.0 in | Wt 187.4 lb

## 2019-08-19 DIAGNOSIS — I10 Essential (primary) hypertension: Secondary | ICD-10-CM

## 2019-08-19 DIAGNOSIS — D692 Other nonthrombocytopenic purpura: Secondary | ICD-10-CM

## 2019-08-19 DIAGNOSIS — D696 Thrombocytopenia, unspecified: Secondary | ICD-10-CM

## 2019-08-19 DIAGNOSIS — E1169 Type 2 diabetes mellitus with other specified complication: Secondary | ICD-10-CM | POA: Diagnosis not present

## 2019-08-19 DIAGNOSIS — I480 Paroxysmal atrial fibrillation: Secondary | ICD-10-CM

## 2019-08-19 DIAGNOSIS — M25552 Pain in left hip: Secondary | ICD-10-CM

## 2019-08-19 DIAGNOSIS — F325 Major depressive disorder, single episode, in full remission: Secondary | ICD-10-CM

## 2019-08-19 DIAGNOSIS — F5101 Primary insomnia: Secondary | ICD-10-CM

## 2019-08-19 DIAGNOSIS — E785 Hyperlipidemia, unspecified: Secondary | ICD-10-CM

## 2019-08-19 DIAGNOSIS — Z7901 Long term (current) use of anticoagulants: Secondary | ICD-10-CM

## 2019-08-19 DIAGNOSIS — Z794 Long term (current) use of insulin: Secondary | ICD-10-CM

## 2019-08-19 DIAGNOSIS — E1122 Type 2 diabetes mellitus with diabetic chronic kidney disease: Secondary | ICD-10-CM

## 2019-08-19 DIAGNOSIS — Z79899 Other long term (current) drug therapy: Secondary | ICD-10-CM

## 2019-08-19 DIAGNOSIS — N182 Chronic kidney disease, stage 2 (mild): Secondary | ICD-10-CM

## 2019-08-19 DIAGNOSIS — I5022 Chronic systolic (congestive) heart failure: Secondary | ICD-10-CM

## 2019-08-19 LAB — CBC WITH DIFFERENTIAL/PLATELET
Absolute Monocytes: 485 cells/uL (ref 200–950)
Basophils Absolute: 40 cells/uL (ref 0–200)
Basophils Relative: 0.8 %
Eosinophils Absolute: 70 cells/uL (ref 15–500)
Eosinophils Relative: 1.4 %
HCT: 43.9 % (ref 38.5–50.0)
Hemoglobin: 14.8 g/dL (ref 13.2–17.1)
Lymphs Abs: 1180 cells/uL (ref 850–3900)
MCH: 31.2 pg (ref 27.0–33.0)
MCHC: 33.7 g/dL (ref 32.0–36.0)
MCV: 92.6 fL (ref 80.0–100.0)
MPV: 9.8 fL (ref 7.5–12.5)
Monocytes Relative: 9.7 %
Neutro Abs: 3225 cells/uL (ref 1500–7800)
Neutrophils Relative %: 64.5 %
Platelets: 135 10*3/uL — ABNORMAL LOW (ref 140–400)
RBC: 4.74 10*6/uL (ref 4.20–5.80)
RDW: 12.1 % (ref 11.0–15.0)
Total Lymphocyte: 23.6 %
WBC: 5 10*3/uL (ref 3.8–10.8)

## 2019-08-19 LAB — PROTIME-INR
INR: 2.7 — ABNORMAL HIGH
Prothrombin Time: 27.1 s — ABNORMAL HIGH (ref 9.0–11.5)

## 2019-08-19 NOTE — Progress Notes (Signed)
FOLLOW UP 3 MONTH  Assessment and Plan:  Robert Arnold was seen today for follow-up.  Diagnoses and all orders for this visit:  PAF (paroxysmal atrial fibrillation) (HCC) Taking Coumadin 5mg  six days a week and 7.5mg  one day a week. Discussed diet Continue same dose every evening, results pendind  -     CBC with Differential/Platelet -     Protime-INR  Long term (current) use of anticoagulants Acquired thrombocytopenia (HCC) No S & S of bleeding Discussed hospital precautions Continue to monitor  Senile purpura ([place]) Discussed process, protect skin, sunscreen  Left hip pain Defer injection today related to timing of second COV2 vaccination, one day ago. He is recipient of kidney transplant Discussed tylenol for pain and alternative intervientsions  Chronic systolic heart failure ([place]) CHF Disease process and medications discussed. Questions answered fully. Emphasized salt restriction, less than 2000mg  a day. Encouraged daily monitoring of the patient's weight, call office if 5 lb weight loss or gain in a day.  Encouraged regular exercise. If any increasing shortness of breath, swelling, or chest pressure go to ER immediately.  decrease your fluid intake to less than 2 L daily please remember to always increase your potassium intake with any increase of your fluid pill.   Type 2 diabetes mellitus with stage 2 chronic kidney disease, with long-term current use of insulin (HCC) Continue insulin 70/30 current regiment BID Discussed general issues about diabetes pathophysiology and management., Educational material distributed., Suggested low cholesterol diet., Encouraged aerobic exercise., Discussed foot care., Reminded to get yearly retinal exam.  CKD stage 2 due to type 2 diabetes mellitus ([place]) Increase fluids  Avoid NSAIDS Blood pressure control Monitor sugars  Will continue to monitor CMP check 07/19/19, GFR 81  Depression, major, in remission ([place]) Continue medications:  Wellbutrin 300mg , four days a week. Discussed stress management techniques  Discussed, increase water,intake & good sleep hygiene  Discussed increasing exercise & vegetables in diet  Primary insomnia Doing well at this time Has taken trazodone in the past PRN  Medication management Continued   Further disposition pending results of labs. Discussed med's effects and SE's.    Over 30 minutes of face to face interview, exam, counseling, chart review, and critical decision making was performed.   Future Appointments  Date Time Provider [place]  08/19/2019  9:30 AM Garnet Sierras, NP [facility] None  09/15/2019 10:00 AM Liane Comber, NP [facility] None  04/18/2020  9:00 AM [name], MD [facility] None    ------------------------------------------------------------------------------------------------------------------  HPI 74 y.o.male presents for follow up on A. Fib on coumadin, CHF, DMII with CKD2, depression, HLD, Insomnia.  Reports that he has chronic left hip pain and he has had an injection for this before.  He was walking over a mile a day. Reports 6/10 pain.  Tylenol arthritis once a week.  He wants to schedule a covid antibody test.  He received his last vaccination one day ago.  He has an area to his left medial ankle that has dry skin,  He uses Kiehls lotion to the area and this does help.  BMI is There is no height or weight on file to calculate BMI., he is working on diet and exercise. Wt Readings from Last 3 Encounters:  07/19/19 187 lb 9.6 oz (85.1 kg)  06/10/19 185 lb (83.9 kg)  05/20/19 184 lb (83.5 kg)    His blood pressure has been controlled at home, today their BP is    He does workout but reports he has not  been walking as much related to the rain and colder temps.. He denies chest pain, shortness of breath, dizziness.  On trazodone 100, 1/2 tablet a night, still has from a year ago.   He has a history of Systolic CHF (EF 76-19%, +  dystolic 50/9326), denies dyspnea on exertion, orthopnea, paroxysmal nocturnal dyspnea and edema. Positive for none. Wt Readings from Last 3 Encounters:  07/19/19 187 lb 9.6 oz (85.1 kg)  06/10/19 185 lb (83.9 kg)  05/20/19 184 lb (83.5 kg)   Patient is on Coumadin for Afib and taking coumadin, 1 tablet 4 days a week and 1.5 tablets 3 days a week. Patient's last INR is  Lab Results  Component Value Date   INR 2.4 (H) 07/19/2019   INR 3.5 (H) 06/10/2019   INR 2.9 (H) 05/06/2019    Patient denies SOB, CP, dizziness, nose bleeds, easy bleeding, and blood in stool/urine. His coumadin dose was changed last visit. He has not taken ABX, has not missed any doses and denies a fall. We are monitoring his CBC routinely.  Current dose: 1 tab of coumadin 5 days and 1.5 tabs on 1 day a week.    He is on cholesterol medication and denies myalgias. His cholesterol is not at goal of less than 70. The cholesterol last visit was:   Lab Results  Component Value Date   CHOL 127 07/19/2019   HDL 58 07/19/2019   LDLCALC 56 07/19/2019   TRIG 54 07/19/2019   CHOLHDL 2.2 07/19/2019    He has been working on diet and exercise for prediabetes, and denies paresthesia of the feet, polydipsia, polyuria and visual disturbances. Last A1C in the office was:  Lab Results  Component Value Date   HGBA1C 5.5 07/19/2019   Patient is on Vitamin D supplement.   Lab Results  Component Value Date   VD25OH 80 07/19/2019      Past Medical History:  Diagnosis Date  . Adenomatous colon polyp   . Allergy   . BPH (benign prostatic hyperplasia)   . CHF (congestive heart failure) (Robert Arnold)   . 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 Diabetes mellitus type 2, controlled (Robert Arnold)   . FHx: heart disease 02/27/2018  . Glaucoma   . Gout   . 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 MI (myocardial infarction) (Robert Arnold) 09/26/2011  . NSTEMI (non-ST elevated myocardial infarction)  (Robert Arnold) 11/26/2013   2013   . PAF (paroxysmal atrial fibrillation) ([place])    s/p DC-CV in 6/13. Off coumadin due to ureteral bleed     Allergies  Allergen Reactions  . Losartan Anaphylaxis  . Crestor [Rosuvastatin]     Elevated LFT's  . Lorazepam     Pt is unsure of reaction   . Morphine And Related Other (See Comments)    Has no effect on pt      Current Outpatient Medications (Endocrine & Metabolic):  .  insulin NPH-regular Human (NOVOLIN 70/30) (70-30) 100 UNIT/ML injection, Take 20-30 units 2 x day or as directed  Current Outpatient Medications (Cardiovascular):  .  atorvastatin (LIPITOR) 40 MG tablet, Take 1 tablet  Daily for Cholesterol   Current Outpatient Medications (Analgesics):  .  allopurinol (ZYLOPRIM) 300 MG tablet, Take 1 tablet Daily for Gout Prevention .  aspirin EC 81 MG tablet, Take 81 mg by mouth daily.  Current Outpatient Medications (Hematological):  .  warfarin (COUMADIN) 5 MG tablet, Take 1  to 2 tablets Daily or as directed  Current Outpatient Medications (Other):  Marland Kitchen  Ascorbic Acid (VITAMIN C) 1000 MG tablet, Take 1,000 mg by mouth daily. .  betamethasone dipropionate (DIPROLENE) 0.05 % cream, Apply 1 application topically daily as needed (eczema).  .  Black Pepper-Turmeric (TURMERIC COMPLEX/BLACK PEPPER PO), Take 1,500 mg by mouth daily. Marland Kitchen  buPROPion (WELLBUTRIN XL) 300 MG 24 hr tablet, Take 1 tablet every Morning for Mood .  Calcium Carbonate-Vitamin D (OYSTER SHELL CALCIUM/D) 250-125 MG-UNIT TABS, Take by mouth. .  cyclobenzaprine (FLEXERIL) 10 MG tablet, Take 1 tablet (10 mg total) by mouth 3 (three) times daily as needed for muscle spasms. .  finasteride (PROSCAR) 5 MG tablet, Take 1 tablet Daily for Prostate .  gabapentin (NEURONTIN) 300 MG capsule, TAKE ONE CAPSULE BY MOUTH THREE TIMES A DAY .  glucose blood (FREESTYLE TEST STRIPS) test strip, Check blood sugar 3 to 4 times daily for medication regulation. Marland Kitchen  latanoprost (XALATAN) 0.005 %  ophthalmic solution, Place 1 drop into both eyes at bedtime.  Marland Kitchen  MAGNESIUM-OXIDE 400 (241.3 Mg) MG tablet, Take 400 mg by mouth 2 (two) times daily.  .  mycophenolate (MYFORTIC) 360 MG TBEC EC tablet, TAKE 1 TABLET BY MOUTH TWICE DAILY .  Probiotic Product (PROBIOTIC DAILY PO), Take 1 capsule by mouth daily. .  tacrolimus (PROGRAF) 0.5 MG capsule, Take 0.5 mg by mouth 2 (two) times daily. .  tacrolimus (PROGRAF) 1 MG capsule, Take 1 mg by mouth 2 (two) times daily. .  traZODone (DESYREL) 50 MG tablet, Take 1 tablet (50 mg total) by mouth at bedtime as needed for sleep. Marland Kitchen  VITAMIN D PO, Take 5,000 Units by mouth. Takes two 5000 unit capsules on MWF and 1 capsule 4 days a week. .  Zinc 50 MG CAPS, Take 50 mg by mouth daily.  ROS: all negative except above.   Physical Exam:  General Appearance: Well nourished, in no apparent distress. Eyes: PERRLA, EOMs, conjunctiva no swelling or erythema Sinuses: No Frontal/maxillary tenderness ENT/Mouth: Ext aud canals clear, TMs without erythema, bulging. No erythema, swelling, or exudate on post pharynx.  Tonsils not swollen or erythematous. Hearing normal.  Neck: Supple, thyroid normal.  Respiratory: Respiratory effort normal, BS equal bilaterally without rales, rhonchi, wheezing or stridor.  Cardio: Irregularly irregular, 3/6 early systolic blowing murmur. Brisk peripheral pulses, left TP delayed compared to right TP, without edema.  Sensation intact, cap refill brisk Abdomen: Soft, + BS.  Non tender, no guarding, rebound, hernias, masses. Lymphatics: Non tender without lymphadenopathy.  Musculoskeletal: Full ROM, 5/5 strength, normal gait.  Skin: left arm with well healing 5 cm area, no warmth, swelling, erythema. Warm, dry without rashes, lesions, ecchymosis.  Neuro: Cranial nerves intact. Normal muscle tone, no cerebellar symptoms.  Psych: Awake and oriented X 3, normal affect, Insight and Judgment appropriate.     Garnet Sierras, NP 9:33  AM [facility]

## 2019-08-25 NOTE — Patient Instructions (Signed)
declined

## 2019-09-14 NOTE — Progress Notes (Signed)
MEDICARE ANNUAL WELLNESS VISIT AND FOLLOW UP Assessment:   Encounter for Medicare annual wellness exam 1 year  Essential hypertension - continue medications, DASH diet, exercise and monitor at home. Call if greater than 130/80.  -     CBC with Differential/Platelet  PAF (paroxysmal atrial fibrillation) (HCC) Check INR q month and will adjust medication according to labs.  Discussed if patient falls to immediately contact office or go to ER. Discussed foods that can increase or decrease Coumadin levels. Patient understands to call the office before starting a new medication. Follow up in one month.   Chronic systolic heart failure ([place]) Improrved on recent ECHO 04/2018 Follows with cardiology  Control blood pressure, cholesterol, glucose, increase exercise.   Weights are down   Thrombocytopenia (HCC) Check CBC at routine visits  Gout due to renal impairment, unspecified chronicity, unspecified site  recheck Uric acid as needed, Diet discussed, continue medications.  Vitamin D deficiency Continue supplement   Medication management Check routine labs   Long term current use of anticoagulant therapy Monitor INR  Benign prostatic hyperplasia without lower urinary tract symptoms Continue medications  Type 2 Diabetes Mellitus Education: Reviewed 'ABCs' of diabetes management (respective goals in parentheses):  A1C (<7), blood pressure (<130/80), and cholesterol (LDL <70) Eye Exam yearly and Dental Exam every 6 months - UTD  Dietary recommendations Physical Activity recommendations  CKD stage 2 due to type 2 diabetes mellitus ([place]) Discussed general issues about diabetes pathophysiology and management., Educational material distributed., Suggested low cholesterol diet., Encouraged aerobic exercise., Discussed foot care., Reminded to get yearly retinal exam.  Mild episode of recurrent major depressive disorder ([place]) Continue medications  Lifestyle discussed: diet/exerise,  sleep hygiene, stress management, hydration   Overweight  - long discussion about weight loss, diet, and exercise -recommended diet heavy in fruits and veggies and low in animal meats, cheeses, and dairy products  Chronic bilateral hip pain Mild, managing with lifestyle modification and tylenol; NSAIDs contraindicated Declines imaging or ortho referral at this time  Hx of renal transplant; AV fistula ([place]) S/p transplant in 2013, doing well since on on myfortic and prograf (follows with [facility]), Dr. Donnetta Hutching PRN for retained AV fistula   Senile purpura ([place]) R/t comadin, monitor, protect skin   L trochanteric bursitis Left Hip Bursitis-  Injection- area cleaned with alcohol, Dexamethasone 10mg  and 1 CC lidocaine injected into greater trochanteric, tolerated well No mobic due to renal history, RICE, and exercises given If not better with refer to orthopedics.    Over 30 minutes of exam, counseling, chart review, and critical decision making was performed  Future Appointments  Date Time Provider [place]  10/21/2019  8:45 AM Garnet Sierras, NP [facility] None  04/18/2020  9:00 AM [name], MD [facility] None     Plan:   During the course of the visit the patient was educated and counseled about appropriate screening and preventive services including:    Pneumococcal vaccine   Influenza vaccine  Prevnar 13  Td vaccine  Screening electrocardiogram  Colorectal cancer screening  Diabetes screening  Glaucoma screening  Nutrition counseling    Subjective:  Robert Arnold is a 74 y.o. male who presents for Medicare Annual Wellness Visit and 3 month follow up for HTN, hyperlipidemia, diabetes, and vitamin D Def.   This year he underwent C6-7 disc decompression and fusion in 04/2018 by Dr. Durene Cal and feels fully resolved without any issues. He has chronic bilateral hip pain but improves with steroid injection, has  recent L hip lateral pain,  pain after walks more than 1/4 mile -   He has history of depression, well controlled with wellbutrin 300 mg daily.   BMI is Body mass index is 28.43 kg/m., he has been working on diet and exercise. Wt Readings from Last 3 Encounters:  09/15/19 187 lb (84.8 kg)  08/19/19 187 lb 6.4 oz (85 kg)  07/19/19 187 lb 9.6 oz (85.1 kg)   He has p. A. Fib, on coumadin; hx of CAD s/p stenting 09/2011 and CABG 02/2012, has done well since. 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  He has hx of systolic CHF, improved/resolved on most recent ECHO 04/2018 however with diastolic dysfunction and LVEF 50-55%.  Follows with Dr. Acie Fredrickson   His blood pressure has been controlled at home (recently running 130/75), today their BP is BP: 122/70 He does not workout, works around the house. He denies chest pain, shortness of breath, dizziness.    He is on cholesterol medication and denies myalgias. His cholesterol is at goal. The cholesterol last visit was:   Lab Results  Component Value Date   CHOL 127 07/19/2019   HDL 58 07/19/2019   LDLCALC 56 07/19/2019   TRIG 54 07/19/2019   CHOLHDL 2.2 07/19/2019    He has been working on diet and exercise for well controlled T2 diabetes on novolin 70/30 22 units AM, 12-15 units at night, and denies increased appetite, nausea, paresthesia of the feet, polydipsia, polyuria and visual disturbances.  Does have occasional low glucose, always in the afternoon.  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  On prograf and myfortic, follows [facility]  Now back in CKD 2 stage;  Lab Results  Component Value Date   GFRNONAA 81 07/19/2019   Patient is on Vitamin D supplement.   Lab Results  Component Value Date   VD25OH 42 07/19/2019     He has history of Afib and is on coumadin, he is on 7.5 mg on Wed and 5 mg  other days, within goal range; no nose bleeds, blood stool/urine. No missed doses, no ABX.  Lab Results  Component Value Date   INR 2.7 (H) 08/19/2019   INR 2.4 (H) 07/19/2019   INR 3.5 (H) 06/10/2019     Medication Review: Current Outpatient Medications on File Prior to Visit  Medication Sig Dispense Refill  . allopurinol (ZYLOPRIM) 300 MG tablet Take 1 tablet Daily for Gout Prevention 90 tablet 3  . Ascorbic Acid (VITAMIN C) 1000 MG tablet Take 1,000 mg by mouth daily.    Marland Kitchen aspirin EC 81 MG tablet Take 81 mg by mouth daily.    Marland Kitchen atorvastatin (LIPITOR) 40 MG tablet Take 1 tablet  Daily for Cholesterol 90 tablet 3  . betamethasone dipropionate (DIPROLENE) 0.05 % cream Apply 1 application topically daily as needed (eczema).     . Black Pepper-Turmeric (TURMERIC COMPLEX/BLACK PEPPER PO) Take 1,500 mg by mouth daily.    Marland Kitchen buPROPion (WELLBUTRIN XL) 300 MG 24 hr tablet Take 1 tablet every Morning for Mood 90 tablet 3  . Calcium Carbonate-Vitamin D (OYSTER SHELL CALCIUM/D) 250-125 MG-UNIT TABS Take by mouth.    . cyclobenzaprine (FLEXERIL) 10 MG tablet Take 1 tablet (10 mg total) by mouth 3 (three) times daily as needed for muscle spasms. 60 tablet  1  . finasteride (PROSCAR) 5 MG tablet Take 1 tablet Daily for Prostate 90 tablet 3  . gabapentin (NEURONTIN) 300 MG capsule TAKE ONE CAPSULE BY MOUTH THREE TIMES A DAY 90 capsule 1  . glucose blood (FREESTYLE TEST STRIPS) test strip Check blood sugar 3 to 4 times daily for medication regulation. 450 each PRN  . insulin NPH-regular Human (NOVOLIN 70/30) (70-30) 100 UNIT/ML injection Take 20-30 units 2 x day or as directed 10 mL 99  . latanoprost (XALATAN) 0.005 % ophthalmic solution Place 1 drop into both eyes at bedtime.   4  . MAGNESIUM-OXIDE 400 (241.3 Mg) MG tablet Take 400 mg by mouth 2 (two) times daily.   6  . mycophenolate (MYFORTIC) 360 MG TBEC EC tablet TAKE 1 TABLET BY MOUTH TWICE DAILY 180 tablet 1  . Probiotic Product (PROBIOTIC DAILY  PO) Take 1 capsule by mouth daily.    . tacrolimus (PROGRAF) 0.5 MG capsule Take 0.5 mg by mouth 2 (two) times daily.    . tacrolimus (PROGRAF) 1 MG capsule Take 1 mg by mouth 2 (two) times daily.    . traZODone (DESYREL) 50 MG tablet Take 1 tablet (50 mg total) by mouth at bedtime as needed for sleep. 90 tablet 3  . VITAMIN D PO Take 5,000 Units by mouth. Takes two 5000 unit capsules on MWF and 1 capsule 4 days a week.    . warfarin (COUMADIN) 5 MG tablet Take 1 to 2 tablets Daily or as directed 180 tablet 3  . Zinc 50 MG CAPS Take 50 mg by mouth daily.     No current facility-administered medications on file prior to visit.    Allergies: Allergies  Allergen Reactions  . Losartan Anaphylaxis  . Crestor [Rosuvastatin]     Elevated LFT's  . Lorazepam     Pt is unsure of reaction   . Morphine And Related Other (See Comments)    Has no effect on pt     Current Problems (verified) has Chronic systolic heart failure ([place]); CKD stage 2 due to type 2 diabetes mellitus ([place]); Hyperlipidemia associated with type 2 diabetes mellitus ([place]); BPH with obstruction/lower urinary tract symptoms; Gout; PAF (paroxysmal atrial fibrillation) ([place]); Essential hypertension; Vitamin D deficiency; Anticoagulant long-term use; Renal Transplant, s/p 09/2011; Depression, major, in remission ([place]); Thrombocytopenia ([place]); CAD (coronary artery disease) of artery bypass graft; IgA nephropathy; Essential tremor; Overweight (BMI 25.0-29.9); Senile purpura ([place]); Type 2 diabetes mellitus with stage 2 chronic kidney disease, with long-term current use of insulin ([place]); Former smoker (quit 1992); HNP (herniated nucleus pulposus), cervical; S/P cervical spinal fusion; Chronic hip pain, bilateral; Hypercoagulopathy ([place]); Mobitz I; A-V fistula ([place]); and Trochanteric bursitis of left hip on their problem list.  Screening Tests Immunization History  Administered Date(s) Administered  . DT (Pediatric) 06/05/2015  . Hepatitis  B 04/08/2009  . Influenza, High Dose Seasonal PF 12/27/2013, 01/17/2015, 01/11/2016, 01/02/2018, 12/21/2018  . Influenza-Unspecified 12/25/2016  . Pneumococcal Conjugate-13 10/25/2013  . Pneumococcal Polysaccharide-23 06/05/2015  . Pneumococcal-Unspecified 04/08/2008  . Tdap 04/08/2008  . Zoster Recombinat (Shingrix) 02/12/2018, 06/02/2018   Preventative care: Last colonoscopy: 03/2016, 5 year follow up  CT AB 2012 CT chest 2010 Sleep study 2013 Echo 02/2013, 11/7865, normal systolic, diastolic dysfunction, LVEF 50-55% Stress test 01/2012  CXR 10/2016  Prior vaccinations: TD or Tdap: 2017  Influenza: 2020 Pneumococcal: 2017 Prevnar13: 2015 Shingles/Zostavax: had 2/2, shingrix  Covid 19: had 2/2, 2021, pfizer  Names of Other Physician/Practitioners you currently use: 1. [facility] here for primary care 2. Dr. Nicki Reaper, [facility], last visit 12/15/18, DM no retinopathy  3. Dr. Mirna Mires, dentist, last visit, 2019 q 6 months, overdue   Patient Care Team: [name], MD as PCP - General (Internal Medicine) Nahser, Wonda Cheng, MD as PCP - Cardiology (Cardiology) Estanislado Emms, MD as Consulting Physician (Nephrology) Bensimhon, Shaune Pascal, MD as Consulting Physician (Cardiology) Estevan Ryder, MD as Referring Physician (Cardiology) Inda Castle, MD (Inactive) as Consulting Physician (Gastroenterology) Macarthur Critchley, OD as Referring Physician (Optometry)  Surgical: He  has a past surgical history that includes Colonoscopy; Peritoneal catheter insertion (2011); AV fistula placement (2011); Kidney transplant (09/25/2011); Coronary angioplasty with stent; Coronary artery bypass graft (2013); and Anterior cervical decomp/discectomy fusion (N/A, 05/04/2018). Family His family history includes COPD in his mother; Diabetes in his mother; Heart attack in his father; Kidney disease in his sister. Social history  He reports that he quit  smoking about 28 years ago. His smoking use included cigarettes. He started smoking about 53 years ago. He has a 48.00 pack-year smoking history. He has never used smokeless tobacco. He reports current alcohol use. He reports that he does not use drugs.  MEDICARE WELLNESS OBJECTIVES: Physical activity: Current Exercise Habits: Home exercise routine, Type of exercise: walking, Time (Minutes): 10, Frequency (Times/Week): 4, Weekly Exercise (Minutes/Week): 40, Intensity: Mild, Exercise limited by: orthopedic condition(s) Cardiac risk factors: Cardiac Risk Factors include: advanced age (>30men, >70 women);dyslipidemia;hypertension;male gender;sedentary lifestyle;smoking/ tobacco exposure;diabetes mellitus Depression/mood screen:   Depression screen [facility] 2/9 09/15/2019  Decreased Interest 0  Down, Depressed, Hopeless 0  PHQ - 2 Score 0  Altered sleeping 0  Tired, decreased energy 0  Change in appetite 0  Feeling bad or failure about yourself  0  Trouble concentrating 0  Moving slowly or fidgety/restless 0  Suicidal thoughts 0  PHQ-9 Score 0  Difficult doing work/chores Not difficult at all  Some recent data might be hidden    ADLs:  In your present state of health, do you have any difficulty performing the following activities: 09/15/2019 04/04/2019  Hearing? N N  Vision? N N  Difficulty concentrating or making decisions? N N  Walking or climbing stairs? N N  Dressing or bathing? N N  Doing errands, shopping? N N  Some recent data might be hidden     Cognitive Testing  Alert? Yes  Normal Appearance?Yes  Oriented to person? Yes  Place? Yes   Time? Yes  Recall of three objects?  Yes  Can perform simple calculations? Yes  Displays appropriate judgment?Yes  Can read the correct time from a watch face?Yes  EOL planning: Does Patient Have a Medical Advance Directive?: Yes Type of Advance Directive: Healthcare Power of Attorney, Living will Does patient want to make changes to medical  advance directive?: No - Patient declined Copy of [place] in Chart?: No - copy requested   Objective:   Today's Vitals   09/15/19 1008  BP: 122/70  Pulse: (!) 55  SpO2: 97%  Weight: 187 lb (84.8 kg)  Height: 5\' 8"  (1.727 m)   Body mass index is 28.43 kg/m.  General Appearance: Well nourished, in no apparent distress. Eyes: PERRLA, EOMs, conjunctiva no swelling or erythema Sinuses: No Frontal/maxillary tenderness ENT/Mouth: Ext aud canals clear, TMs without erythema, bulging. No erythema, swelling, or exudate on post pharynx.  Tonsils not swollen or erythematous. Hearing normal.  Neck: Supple, thyroid normal.  Respiratory: Respiratory effort normal,  BS equal bilaterally without rales, rhonchi, wheezing or stridor.  Cardio: Irregularly irregular, 3/6 early systolic blowing murmur. Brisk LE pulses, without edema. Has AV fistula to left forearm with thrill. Sensation intact, cap refill brisk Abdomen: Soft, + BS.  Non tender, no guarding, rebound, hernias, masses. Lymphatics: Non tender without lymphadenopathy.  Musculoskeletal: Full ROM, 5/5 strength, normal gait. L greater trochanteric tenderness without heat or redness Skin: Warm, dry without rashes, lesions; small ecchymoses to bilateral upper extremities Neuro: Cranial nerves intact. Normal muscle tone, no cerebellar symptoms.  Psych: Awake and oriented X 3, normal affect, Insight and Judgment appropriate.    Medicare Attestation I have personally reviewed: The patient's medical and social history Their use of alcohol, tobacco or illicit drugs Their current medications and supplements The patient's functional ability including ADLs,fall risks, home safety risks, cognitive, and hearing and visual impairment Diet and physical activities Evidence for depression or mood disorders  The patient's weight, height, BMI, and visual acuity have been recorded in the chart.  I have made referrals, counseling, and  provided education to the patient based on review of the above and I have provided the patient with a written personalized care plan for preventive services.     Izora Ribas, NP   09/15/2019

## 2019-09-15 ENCOUNTER — Other Ambulatory Visit: Payer: Self-pay

## 2019-09-15 ENCOUNTER — Encounter: Payer: Self-pay | Admitting: Adult Health

## 2019-09-15 ENCOUNTER — Ambulatory Visit (INDEPENDENT_AMBULATORY_CARE_PROVIDER_SITE_OTHER): Payer: Medicare Other | Admitting: Adult Health

## 2019-09-15 VITALS — BP 122/70 | HR 55 | Ht 68.0 in | Wt 187.0 lb

## 2019-09-15 DIAGNOSIS — M7062 Trochanteric bursitis, left hip: Secondary | ICD-10-CM | POA: Diagnosis not present

## 2019-09-15 DIAGNOSIS — M1A3790 Chronic gout due to renal impairment, unspecified ankle and foot, without tophus (tophi): Secondary | ICD-10-CM

## 2019-09-15 DIAGNOSIS — Z7901 Long term (current) use of anticoagulants: Secondary | ICD-10-CM

## 2019-09-15 DIAGNOSIS — M5020 Other cervical disc displacement, unspecified cervical region: Secondary | ICD-10-CM

## 2019-09-15 DIAGNOSIS — E1122 Type 2 diabetes mellitus with diabetic chronic kidney disease: Secondary | ICD-10-CM

## 2019-09-15 DIAGNOSIS — I441 Atrioventricular block, second degree: Secondary | ICD-10-CM

## 2019-09-15 DIAGNOSIS — I480 Paroxysmal atrial fibrillation: Secondary | ICD-10-CM

## 2019-09-15 DIAGNOSIS — Z794 Long term (current) use of insulin: Secondary | ICD-10-CM

## 2019-09-15 DIAGNOSIS — I10 Essential (primary) hypertension: Secondary | ICD-10-CM

## 2019-09-15 DIAGNOSIS — Z87891 Personal history of nicotine dependence: Secondary | ICD-10-CM

## 2019-09-15 DIAGNOSIS — D6859 Other primary thrombophilia: Secondary | ICD-10-CM

## 2019-09-15 DIAGNOSIS — Z0001 Encounter for general adult medical examination with abnormal findings: Secondary | ICD-10-CM

## 2019-09-15 DIAGNOSIS — Z940 Kidney transplant status: Secondary | ICD-10-CM

## 2019-09-15 DIAGNOSIS — I5022 Chronic systolic (congestive) heart failure: Secondary | ICD-10-CM

## 2019-09-15 DIAGNOSIS — Z0000 Encounter for general adult medical examination without abnormal findings: Secondary | ICD-10-CM

## 2019-09-15 DIAGNOSIS — F325 Major depressive disorder, single episode, in full remission: Secondary | ICD-10-CM

## 2019-09-15 DIAGNOSIS — D696 Thrombocytopenia, unspecified: Secondary | ICD-10-CM

## 2019-09-15 DIAGNOSIS — N182 Chronic kidney disease, stage 2 (mild): Secondary | ICD-10-CM

## 2019-09-15 DIAGNOSIS — R6889 Other general symptoms and signs: Secondary | ICD-10-CM | POA: Diagnosis not present

## 2019-09-15 DIAGNOSIS — E1169 Type 2 diabetes mellitus with other specified complication: Secondary | ICD-10-CM

## 2019-09-15 DIAGNOSIS — E663 Overweight: Secondary | ICD-10-CM

## 2019-09-15 DIAGNOSIS — I770 Arteriovenous fistula, acquired: Secondary | ICD-10-CM | POA: Insufficient documentation

## 2019-09-15 DIAGNOSIS — D692 Other nonthrombocytopenic purpura: Secondary | ICD-10-CM

## 2019-09-15 DIAGNOSIS — G250 Essential tremor: Secondary | ICD-10-CM

## 2019-09-15 DIAGNOSIS — N138 Other obstructive and reflux uropathy: Secondary | ICD-10-CM

## 2019-09-15 DIAGNOSIS — E559 Vitamin D deficiency, unspecified: Secondary | ICD-10-CM

## 2019-09-15 MED ORDER — DEXAMETHASONE SODIUM PHOSPHATE 10 MG/ML IJ SOLN
10.0000 mg | Freq: Once | INTRAMUSCULAR | Status: AC
  Administered 2019-09-15: 10 mg

## 2019-09-15 NOTE — Patient Instructions (Signed)
Hip Bursitis  Hip bursitis is swelling of a fluid-filled sac (bursa) in your hip joint. This swelling (inflammation) can be painful. This condition may come and go over time. What are the causes?  Injury to the hip.  Overuse of the muscles that surround the hip joint.  An earlier injury or surgery of the hip.  Arthritis or gout.  Diabetes.  Thyroid disease.  Infection.  In some cases, the cause may not be known. What are the signs or symptoms?  Mild or moderate pain in the hip area. Pain may get worse with movement.  Tenderness and swelling of the hip, especially on the outer side of the hip.  In rare cases, the bursa may become infected. This may cause: ? A fever. ? Warmth and redness in the area. Symptoms may come and go. How is this treated? This condition is treated by resting, icing, applying pressure (compression), and raising (elevating) the injured area. You may hear this called the RICE treatment. Treatment may also include:  Using crutches.  Draining fluid out of the bursa to help relieve swelling.  Giving a shot of (injecting) medicine that helps to reduce swelling (cortisone).  Other medicines if the bursa is infected. Follow these instructions at home: Managing pain, stiffness, and swelling   If told, put ice on the painful area. ? Put ice in a plastic bag. ? Place a towel between your skin and the bag. ? Leave the ice on for 20 minutes, 2-3 times a day. ? Raise (elevate) your hip above the level of your heart as much as you can without pain. To do this, try putting a pillow under your hips while you lie down. Stop if this causes pain. Activity  Return to your normal activities as told by your doctor. Ask your doctor what activities are safe for you.  Rest and protect your hip as much as you can until you feel better. General instructions  Take over-the-counter and prescription medicines only as told by your doctor.  Wear wraps that put pressure  on your hip (compression wraps) only as told by your doctor.  Do not use your hip to support your body weight until your doctor says that you can.  Use crutches as told by your doctor.  Gently rub and stretch your injured area as often as is comfortable.  Keep all follow-up visits as told by your doctor. This is important. How is this prevented?  Exercise regularly, as told by your doctor.  Warm up and stretch before being active.  Cool down and stretch after being active.  Avoid activities that bother your hip or cause pain.  Avoid sitting down for long periods at a time. Contact a doctor if:  You have a fever.  You get new symptoms.  You have trouble walking.  You have trouble doing everyday activities.  You have pain that gets worse.  You have pain that does not get better with medicine.  You get red skin on your hip area.  You get a feeling of warmth in your hip area. Get help right away if:  You cannot move your hip.  You have very bad pain. Summary  Hip bursitis is swelling of a fluid-filled sac (bursa) in your hip.  Hip bursitis can be painful.  Symptoms often come and go over time.  This condition is treated with rest, ice, compression, elevation, and medicines. This information is not intended to replace advice given to you by your health care provider.   Make sure you discuss any questions you have with your health care provider. Document Revised: 12/01/2017 Document Reviewed: 12/01/2017 Elsevier Patient Education  2020 [employer]  

## 2019-09-16 ENCOUNTER — Other Ambulatory Visit: Payer: Self-pay | Admitting: Adult Health

## 2019-09-16 DIAGNOSIS — I480 Paroxysmal atrial fibrillation: Secondary | ICD-10-CM

## 2019-09-16 LAB — CBC WITH DIFFERENTIAL/PLATELET
Absolute Monocytes: 413 cells/uL (ref 200–950)
Basophils Absolute: 41 cells/uL (ref 0–200)
Basophils Relative: 0.8 %
Eosinophils Absolute: 82 cells/uL (ref 15–500)
Eosinophils Relative: 1.6 %
HCT: 45.6 % (ref 38.5–50.0)
Hemoglobin: 15 g/dL (ref 13.2–17.1)
Lymphs Abs: 1158 cells/uL (ref 850–3900)
MCH: 30.5 pg (ref 27.0–33.0)
MCHC: 32.9 g/dL (ref 32.0–36.0)
MCV: 92.7 fL (ref 80.0–100.0)
MPV: 9.6 fL (ref 7.5–12.5)
Monocytes Relative: 8.1 %
Neutro Abs: 3407 cells/uL (ref 1500–7800)
Neutrophils Relative %: 66.8 %
Platelets: 160 10*3/uL (ref 140–400)
RBC: 4.92 10*6/uL (ref 4.20–5.80)
RDW: 12.4 % (ref 11.0–15.0)
Total Lymphocyte: 22.7 %
WBC: 5.1 10*3/uL (ref 3.8–10.8)

## 2019-09-16 LAB — PROTIME-INR
INR: 1.4 — ABNORMAL HIGH
Prothrombin Time: 15.1 s — ABNORMAL HIGH (ref 9.0–11.5)

## 2019-09-18 ENCOUNTER — Other Ambulatory Visit: Payer: Self-pay | Admitting: Internal Medicine

## 2019-09-18 DIAGNOSIS — M1A3790 Chronic gout due to renal impairment, unspecified ankle and foot, without tophus (tophi): Secondary | ICD-10-CM

## 2019-09-30 ENCOUNTER — Ambulatory Visit: Payer: Medicare Other

## 2019-10-13 ENCOUNTER — Other Ambulatory Visit: Payer: Self-pay | Admitting: Adult Health

## 2019-10-13 DIAGNOSIS — M542 Cervicalgia: Secondary | ICD-10-CM

## 2019-10-18 NOTE — Progress Notes (Signed)
FOLLOW UP: INR  Assessment:   Essential hypertension - continue medications, DASH diet, exercise and monitor at home. Call if greater than 130/80.  -     CBC with Differential/Platelet  PAF (paroxysmal atrial fibrillation) ([place]) Long term current use of anticoagulant therapy  Check INR Q month and will adjust medication according to labs.  Currently taking Coumadin 7.5mg  two days a week and 5mg  five days a week.  Discussed if patient falls to immediately contact office or go to ER. Discussed foods that can increase or decrease Coumadin levels. Patient understands to call the office before starting a new medication. Follow up [ending results  Thrombocytopenia, acuired [facility]) Monitoring PT/INR No S&S of bleeding Discussed hospital precautions  Type 2 diabetes mellitus with stage 2 chronic kidney disease, with long-term current use of insulin (HCC) Continue medications: NPH, 20-30 units BID Discussed general issues about diabetes pathophysiology and management. Education: Reviewed 'ABCs' of diabetes management (respective goals in parentheses):  A1C (<7), blood pressure (<130/80), and cholesterol (LDL <70) Dietary recommendations Encouraged aerobic exercise.  Discussed foot care, check daily Yearly retinal exam Dental exam every 6 months Monitor blood glucose, discussed goal for patient   CKD stage 2 due to type 2 diabetes mellitus ([place]) Increase fluids  Avoid NSAIDS Blood pressure control Monitor sugars  Will continue to monitor  Senile purpura ([place]) Discussed process, protect skin, sunscreen  Medication management Continued   Over 20 minutes of face to face interview, exam, counseling, chart review, and critical decision making was performed  Future Appointments  Date Time Provider [place]  04/18/2020  9:00 AM [name], MD [facility] None     Plan:   During the course of the visit the patient was educated and counseled about appropriate  screening and preventive services including:    Pneumococcal vaccine   Influenza vaccine  Prevnar 13  Td vaccine  Screening electrocardiogram  Colorectal cancer screening  Diabetes screening  Glaucoma screening  Nutrition counseling    Subjective:  Robert Arnold is a 74 y.o. male who presents for follow up for INR check.    BMI is Body mass index is 28.13 kg/m., he has been working on diet and exercise. Wt Readings from Last 3 Encounters:  10/21/19 185 lb (83.9 kg)  09/15/19 187 lb (84.8 kg)  08/19/19 187 lb 6.4 oz (85 kg)   He has p. A. Fib, on coumadin; hx of CAD s/p stenting 09/2011 and CABG 02/2012, has done well since. 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  He has hx of systolic CHF, improved/resolved on most recent ECHO 04/2018 however with diastolic dysfunction and LVEF 50-55%.  Follows with Dr. Acie Fredrickson   His blood pressure has been controlled at home (recently running 130/75), today their BP is BP: 128/82 He does not workout, works around the house. He denies chest pain, shortness of breath, dizziness.   He has history of Afib and is on coumadin, he is on 7.5 mg two days a week and 5 mg other days.  He was not to goal last OV, subtherapeutic with dose change.; no nose bleeds, blood stool/urine. No missed doses, no ABX.  Lab Results  Component Value Date   INR 1.4 (H) 09/15/2019   INR 2.7 (H) 08/19/2019   INR 2.4 (H) 07/19/2019      He has been working on diet and exercise for well controlled T2 diabetes on novolin 70/30 22 units AM, 12-15 units at night, and denies increased appetite, nausea, paresthesia of  the feet, polydipsia, polyuria and visual disturbances.  Does have occasional low glucose, always in the afternoon.  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  On prograf and myfortic,  follows [facility]  Now back in CKD 2 stage;  Lab Results  Component Value Date   [facility] 81 07/19/2019     Medication Review: Current Outpatient Medications on File Prior to Visit  Medication Sig Dispense Refill  . allopurinol (ZYLOPRIM) 300 MG tablet Take 1 tablet Daily to Prevent Gout 90 tablet 0  . Ascorbic Acid (VITAMIN C) 1000 MG tablet Take 1,000 mg by mouth daily.    Marland Kitchen aspirin EC 81 MG tablet Take 81 mg by mouth daily.    Marland Kitchen atorvastatin (LIPITOR) 40 MG tablet Take 1 tablet  Daily for Cholesterol 90 tablet 3  . betamethasone dipropionate (DIPROLENE) 0.05 % cream Apply 1 application topically daily as needed (eczema).     . Black Pepper-Turmeric (TURMERIC COMPLEX/BLACK PEPPER PO) Take 1,500 mg by mouth daily.    Marland Kitchen buPROPion (WELLBUTRIN XL) 300 MG 24 hr tablet Take 1 tablet every Morning for Mood 90 tablet 3  . Calcium Carbonate-Vitamin D (OYSTER SHELL CALCIUM/D) 250-125 MG-UNIT TABS Take by mouth.    . cyclobenzaprine (FLEXERIL) 10 MG tablet Take 1 tablet (10 mg total) by mouth 3 (three) times daily as needed for muscle spasms. 60 tablet 1  . finasteride (PROSCAR) 5 MG tablet Take 1 tablet Daily for Prostate 90 tablet 3  . gabapentin (NEURONTIN) 300 MG capsule Take 1 capsule three times a day. 90 capsule 0  . glucose blood (FREESTYLE TEST STRIPS) test strip Check blood sugar 3 to 4 times daily for medication regulation. 450 each PRN  . insulin NPH-regular Human (NOVOLIN 70/30) (70-30) 100 UNIT/ML injection Take 20-30 units 2 x day or as directed 10 mL 99  . latanoprost (XALATAN) 0.005 % ophthalmic solution Place 1 drop into both eyes at bedtime.   4  . MAGNESIUM-OXIDE 400 (241.3 Mg) MG tablet Take 400 mg by mouth 2 (two) times daily.   6  . mycophenolate (MYFORTIC) 360 MG TBEC EC tablet TAKE 1 TABLET BY MOUTH TWICE DAILY 180 tablet 1  . Probiotic Product (PROBIOTIC DAILY PO) Take 1 capsule by mouth daily.    . tacrolimus (PROGRAF) 0.5 MG capsule Take 0.5 mg by mouth 2 (two)  times daily.    . tacrolimus (PROGRAF) 1 MG capsule Take 1 mg by mouth 2 (two) times daily.    . traZODone (DESYREL) 50 MG tablet Take 1 tablet (50 mg total) by mouth at bedtime as needed for sleep. 90 tablet 3  . VITAMIN D PO Take 5,000 Units by mouth. Takes two 5000 unit capsules on MWF and 1 capsule 4 days a week.    . warfarin (COUMADIN) 5 MG tablet Take 1 to 2 tablets Daily or as directed 180 tablet 3  . Zinc 50 MG CAPS Take 50 mg by mouth daily.     No current facility-administered medications on file prior to visit.    Allergies: Allergies  Allergen Reactions  . Losartan Anaphylaxis  . Crestor [Rosuvastatin]     Elevated LFT's  . Lorazepam     Pt is unsure of reaction   . Morphine And Related Other (See Comments)    Has no  effect on pt     Current Problems (verified) has Chronic systolic heart failure ([place]); CKD stage 2 due to type 2 diabetes mellitus ([place]); Hyperlipidemia associated with type 2 diabetes mellitus ([place]); BPH with obstruction/lower urinary tract symptoms; Gout; PAF (paroxysmal atrial fibrillation) ([place]); Essential hypertension; Vitamin D deficiency; Anticoagulant long-term use; Renal Transplant, s/p 09/2011; Depression, major, in remission ([place]); Thrombocytopenia ([place]); CAD (coronary artery disease) of artery bypass graft; IgA nephropathy; Essential tremor; Overweight (BMI 25.0-29.9); Senile purpura ([place]); Type 2 diabetes mellitus with stage 2 chronic kidney disease, with long-term current use of insulin ([place]); Former smoker (quit 1992); HNP (herniated nucleus pulposus), cervical; S/P cervical spinal fusion; Chronic hip pain, bilateral; Hypercoagulopathy ([place]); Mobitz I; A-V fistula ([place]); and Trochanteric bursitis of left hip on their problem list.  Screening Tests Immunization History  Administered Date(s) Administered  . DT (Pediatric) 06/05/2015  . Hepatitis B 04/08/2009  . Influenza, High Dose Seasonal PF 12/27/2013, 01/17/2015, 01/11/2016, 01/02/2018,  12/21/2018  . Influenza-Unspecified 12/25/2016  . Pneumococcal Conjugate-13 10/25/2013  . Pneumococcal Polysaccharide-23 06/05/2015  . Pneumococcal-Unspecified 04/08/2008  . Tdap 04/08/2008  . Zoster Recombinat (Shingrix) 02/12/2018, 06/02/2018   Preventative care: Last colonoscopy: 03/2016, 5 year follow up  CT AB 2012 CT chest 2010 Sleep study 2013 Echo 02/2013, 10/1243, normal systolic, diastolic dysfunction, LVEF 50-55% Stress test 01/2012  CXR 10/2016  Prior vaccinations: TD or Tdap: 2017  Influenza: 2020 Pneumococcal: 2017 Prevnar13: 2015 Shingles/Zostavax: had 2/2, shingrix  Covid 19: had 2/2, 2021, pfizer  Names of Other Physician/Practitioners you currently use: 1. [facility] here for primary care 2. Dr. Nicki Reaper, [facility], last visit 12/15/18, DM no retinopathy  3. Dr. Mirna Mires, dentist, last visit, 2019 q 6 months, overdue   Patient Care Team: [name], MD as PCP - General (Internal Medicine) Nahser, Wonda Cheng, MD as PCP - Cardiology (Cardiology) Estanislado Emms, MD as Consulting Physician (Nephrology) Bensimhon, Shaune Pascal, MD as Consulting Physician (Cardiology) Estevan Ryder, MD as Referring Physician (Cardiology) Inda Castle, MD (Inactive) as Consulting Physician (Gastroenterology) Macarthur Critchley, OD as Referring Physician (Optometry)  Surgical: He  has a past surgical history that includes Colonoscopy; Peritoneal catheter insertion (2011); AV fistula placement (2011); Kidney transplant (09/25/2011); Coronary angioplasty with stent; Coronary artery bypass graft (2013); and Anterior cervical decomp/discectomy fusion (N/A, 05/04/2018). Family His family history includes COPD in his mother; Diabetes in his mother; Heart attack in his father; Kidney disease in his sister. Social history  He reports that he quit smoking about 28 years ago. His smoking use included cigarettes. He started smoking about 53 years ago.  He has a 48.00 pack-year smoking history. He has never used smokeless tobacco. He reports current alcohol use. He reports that he does not use drugs.    Objective:   Today's Vitals   10/21/19 0844  BP: 128/82  Pulse: 67  Temp: 98.2 F (36.8 C)  SpO2: 98%  Weight: 185 lb (83.9 kg)  PainSc: 0-No pain   Body mass index is 28.13 kg/m.  General Appearance: Well nourished, in no apparent distress. Eyes: PERRLA, EOMs, conjunctiva no swelling or erythema Sinuses: No Frontal/maxillary tenderness ENT/Mouth: Ext aud canals clear, TMs without erythema, bulging. No erythema, swelling, or exudate on post pharynx.  Tonsils not swollen or erythematous. Hearing normal.  Neck: Supple, thyroid normal.  Respiratory: Respiratory effort normal, BS equal bilaterally without rales, rhonchi, wheezing or stridor.  Cardio: Irregularly irregular, 3/6 early systolic blowing murmur. Brisk LE pulses, without edema.  Has AV fistula to left forearm with thrill. Sensation intact, cap refill brisk Abdomen: Soft, + BS.  Non tender, no guarding, rebound, hernias, masses. Lymphatics: Non tender without lymphadenopathy.  Musculoskeletal: Full ROM, 5/5 strength, normal gait. L greater trochanteric tenderness without heat or redness Skin: Warm, dry without rashes, lesions; small ecchymoses to bilateral upper extremities Neuro: Cranial nerves intact. Normal muscle tone, no cerebellar symptoms.  Psych: Awake and oriented X 3, normal affect, Insight and Judgment appropriate.    Garnet Sierras, Laqueta Jean, DNP [facility] 10/21/2019  9:05 AM

## 2019-10-19 IMAGING — MR MR CERVICAL SPINE W/O CM
4 of 6 series · 27 of 48 positions shown · non-contrast
Comparison: Plain film cervical spine 03/30/2018.

CLINICAL DATA: Acute onset left neck and shoulder pain 2 weeks ago.
No known injury.

EXAM:
MRI CERVICAL SPINE WITHOUT CONTRAST
TECHNIQUE: Multiplanar, multisequence MR imaging of the cervical spine was
performed. No intravenous contrast was administered.

[Series 5: T2 · sagittal · 3.0mm · 0.55mm/px · 6 of 17 slices shown (1 of 2)]
[im 1/17]
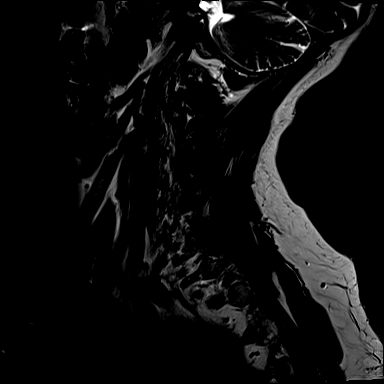
[im 4/17]
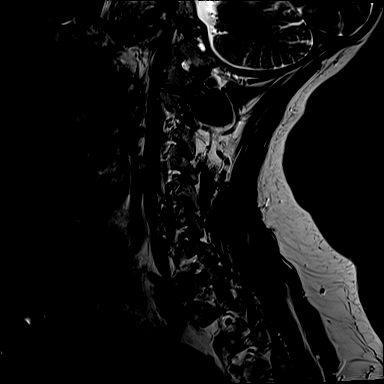
[im 7/17]
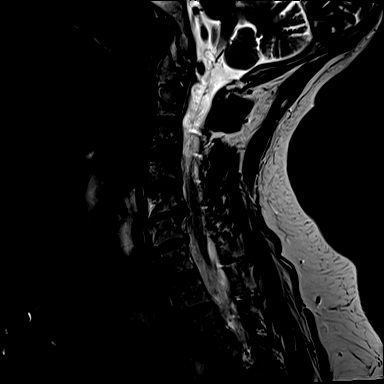
[im 10/17]
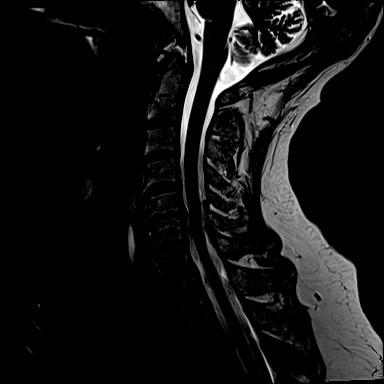
[im 13/17]
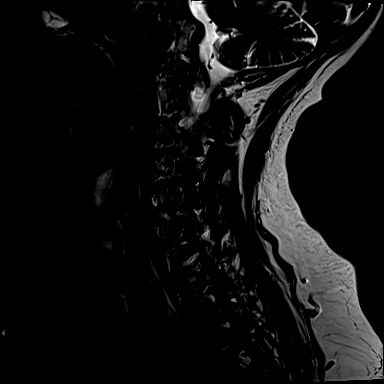
[im 17/17]
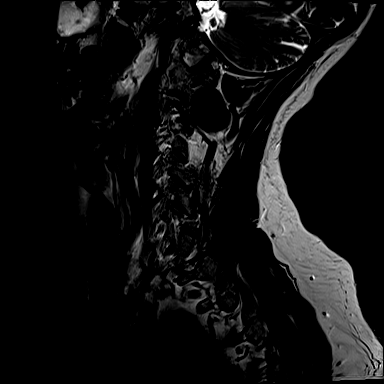

[Series 6: STIR · sagittal · 3.0mm · 0.33mm/px · 6 of 17 slices shown]
[im 1/17]
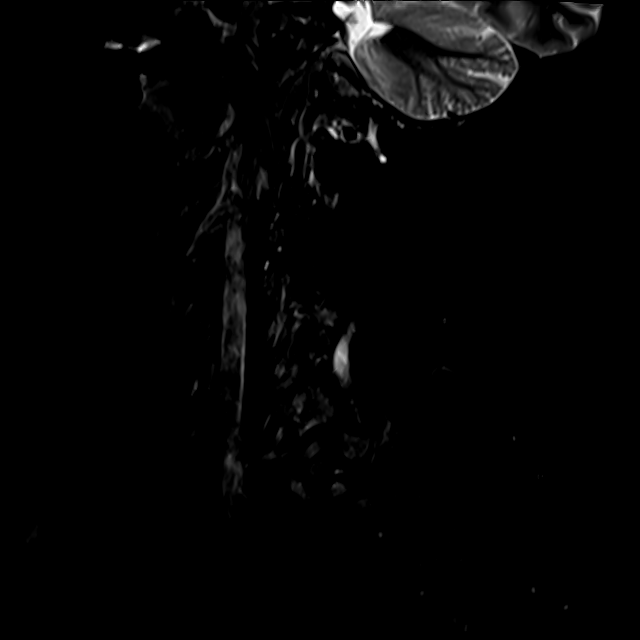
[im 4/17]
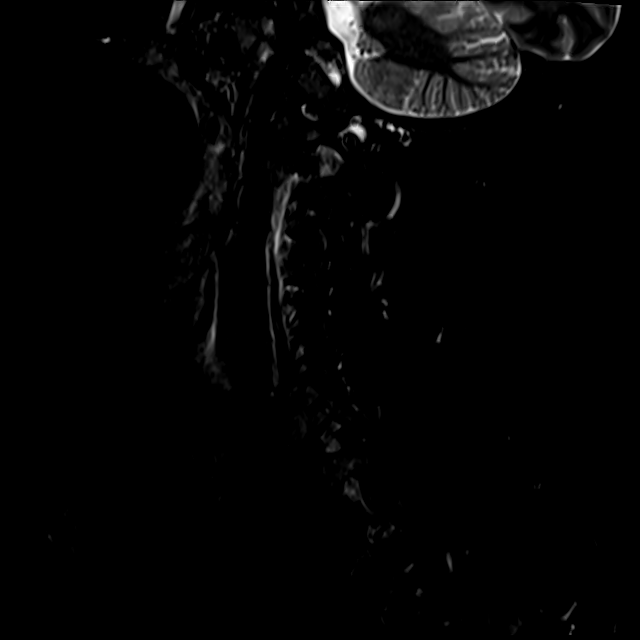
[im 7/17]
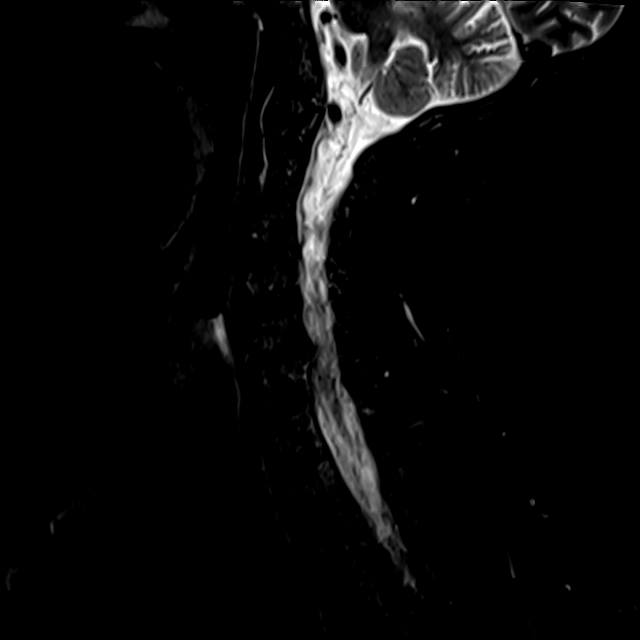
[im 10/17]
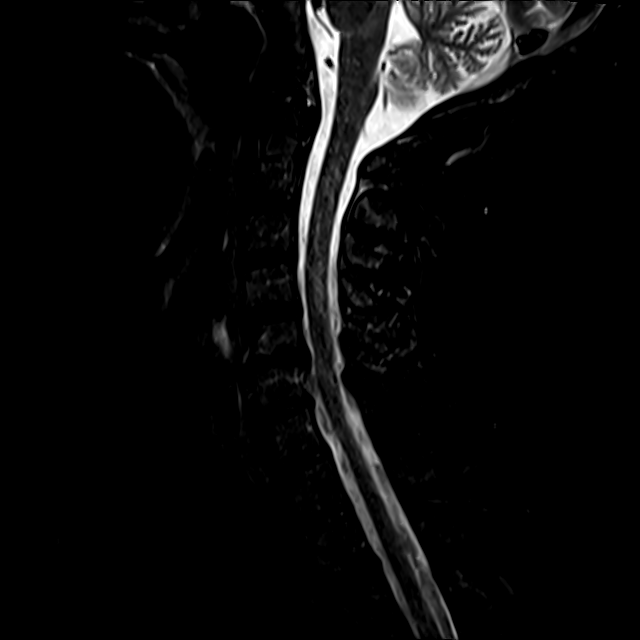
[im 13/17]
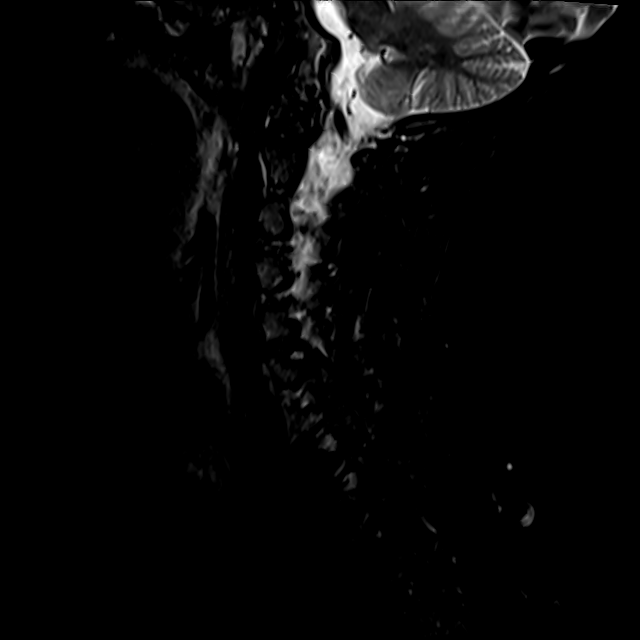
[im 17/17]
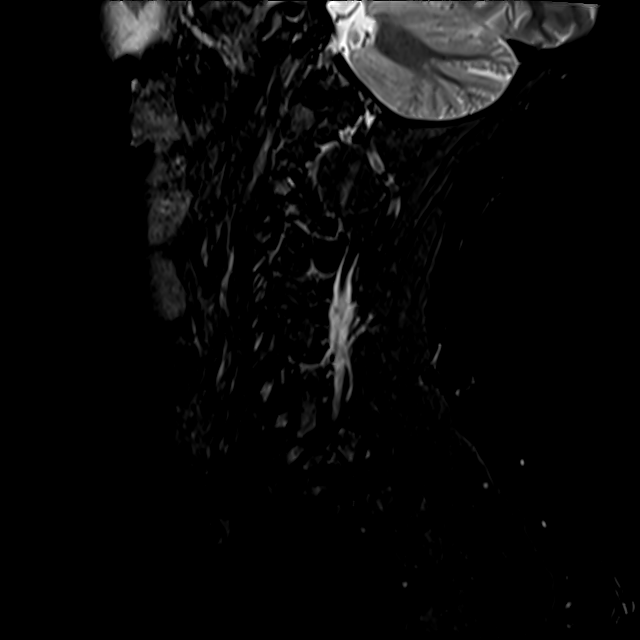

[Series 7: T1 · sagittal · 3.0mm · 0.66mm/px · 6 of 17 slices shown]
[im 1/17]
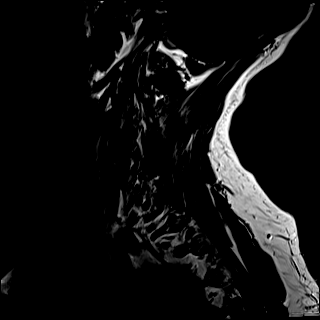
[im 4/17]
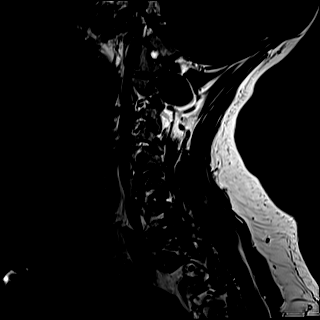
[im 7/17]
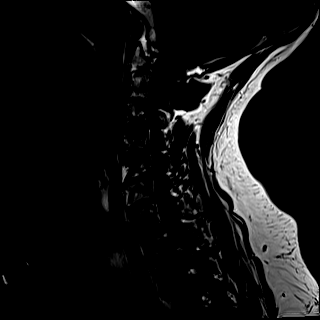
[im 10/17]
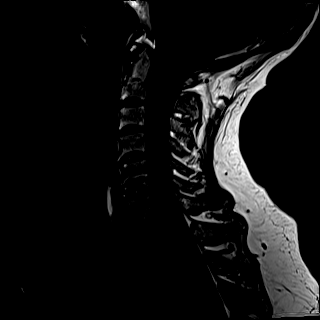
[im 13/17]
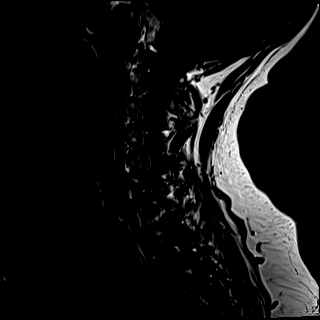
[im 17/17]
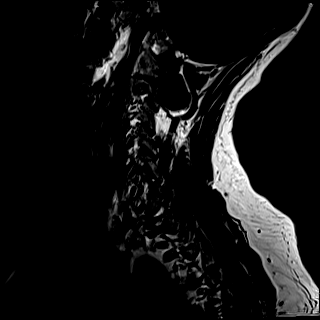

[Series 8: T2 · axial · 3.0mm · 0.50mm/px · z∈[-83,+10]mm · 9 of 32 slices shown (2 of 2)]
[im 1/32]
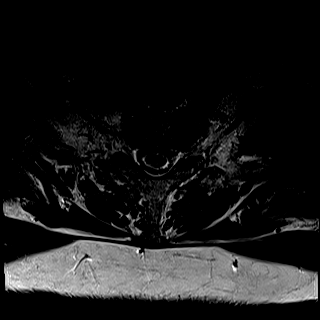
[im 6/32]
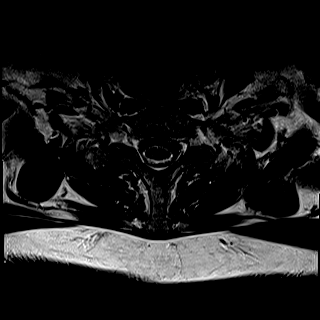
[im 9/32]
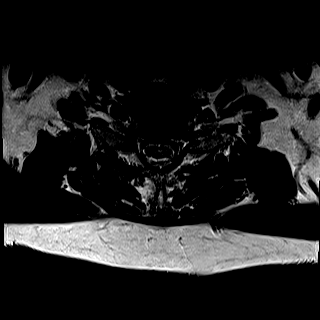
[im 15/32]
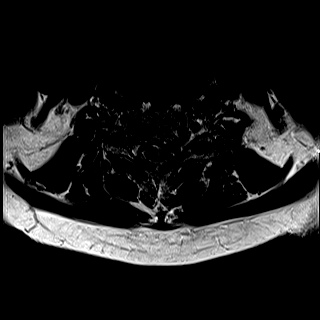
[im 17/32]
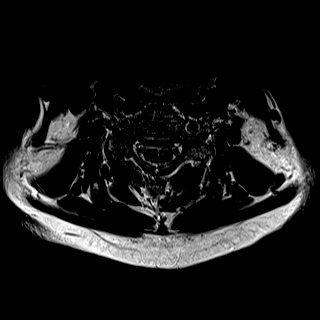
[im 23/32]
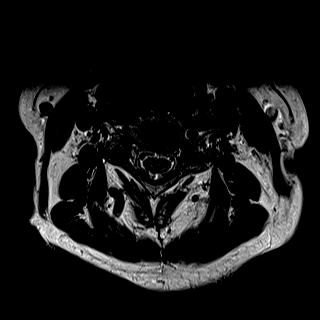
[im 26/32]
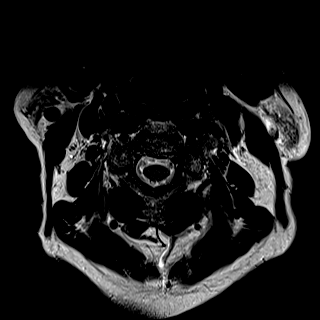
[im 29/32]
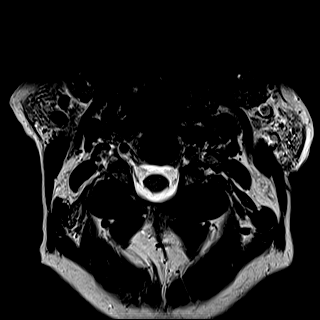
[im 32/32]
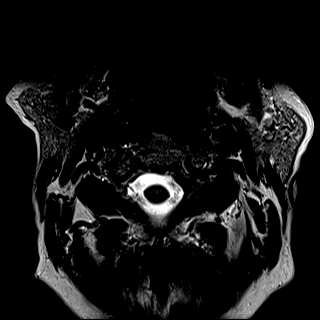

[27 of 48 positions shown; findings below may reference images not displayed]

FINDINGS: Alignment: Maintained.

Vertebrae: No fracture or worrisome lesion. There is some
degenerative endplate signal change at C5-6.

Cord: Normal signal throughout.

Posterior Fossa, vertebral arteries, paraspinal tissues: Negative.

Disc levels:

C2-3: Mild bilateral facet degenerative disease. Otherwise negative.

C3-4: Mild bilateral facet degenerative change and uncovertebral
spurring. The central canal and right foramen are open. Mild to
moderate left foraminal narrowing is present.

C4-5: Shallow disc bulge, mild uncovertebral disease and
mild-to-moderate facet arthropathy. The central canal and right
foramen are open. Mild to moderate left foraminal narrowing is
present.

C5-6: There is loss of disc space height with a disc osteophyte
complex and bilateral uncovertebral disease. The ventral thecal sac
is effaced. Moderately severe to severe bilateral foraminal
narrowing is present.

C6-7: Very shallow disc bulge without stenosis.

C7-T1: Mild facet arthropathy.  Otherwise negative.
IMPRESSION: Spondylosis appearing worst at C5-6 where a disc osteophyte complex
effaces the ventral thecal sac and uncovertebral disease causes
moderately severe to severe bilateral foraminal narrowing.

Mild to moderate left foraminal narrowing at C3-4 and C4-5 due to
some uncovertebral spurring and facet arthropathy. The central canal
and right foramen are open at this level.

## 2019-10-21 ENCOUNTER — Ambulatory Visit (INDEPENDENT_AMBULATORY_CARE_PROVIDER_SITE_OTHER): Payer: Medicare Other | Admitting: Adult Health Nurse Practitioner

## 2019-10-21 ENCOUNTER — Encounter: Payer: Self-pay | Admitting: Adult Health Nurse Practitioner

## 2019-10-21 ENCOUNTER — Other Ambulatory Visit: Payer: Self-pay

## 2019-10-21 VITALS — BP 128/82 | HR 67 | Temp 98.2°F | Wt 185.0 lb

## 2019-10-21 DIAGNOSIS — I25708 Atherosclerosis of coronary artery bypass graft(s), unspecified, with other forms of angina pectoris: Secondary | ICD-10-CM

## 2019-10-21 DIAGNOSIS — Z7901 Long term (current) use of anticoagulants: Secondary | ICD-10-CM

## 2019-10-21 DIAGNOSIS — Z794 Long term (current) use of insulin: Secondary | ICD-10-CM

## 2019-10-21 DIAGNOSIS — E1122 Type 2 diabetes mellitus with diabetic chronic kidney disease: Secondary | ICD-10-CM | POA: Diagnosis not present

## 2019-10-21 DIAGNOSIS — I480 Paroxysmal atrial fibrillation: Secondary | ICD-10-CM | POA: Diagnosis not present

## 2019-10-21 DIAGNOSIS — N182 Chronic kidney disease, stage 2 (mild): Secondary | ICD-10-CM

## 2019-10-21 DIAGNOSIS — D696 Thrombocytopenia, unspecified: Secondary | ICD-10-CM

## 2019-10-21 DIAGNOSIS — Z79899 Other long term (current) drug therapy: Secondary | ICD-10-CM

## 2019-10-21 DIAGNOSIS — D692 Other nonthrombocytopenic purpura: Secondary | ICD-10-CM

## 2019-10-21 DIAGNOSIS — I10 Essential (primary) hypertension: Secondary | ICD-10-CM

## 2019-10-22 ENCOUNTER — Other Ambulatory Visit: Payer: Self-pay | Admitting: Adult Health Nurse Practitioner

## 2019-10-22 DIAGNOSIS — I480 Paroxysmal atrial fibrillation: Secondary | ICD-10-CM

## 2019-10-22 LAB — PROTIME-INR
INR: 3.4 — ABNORMAL HIGH
Prothrombin Time: 34.8 s — ABNORMAL HIGH (ref 9.0–11.5)

## 2019-10-27 ENCOUNTER — Other Ambulatory Visit: Payer: Self-pay | Admitting: Internal Medicine

## 2019-10-27 DIAGNOSIS — I482 Chronic atrial fibrillation, unspecified: Secondary | ICD-10-CM

## 2019-11-12 ENCOUNTER — Other Ambulatory Visit: Payer: Self-pay | Admitting: Internal Medicine

## 2019-11-12 DIAGNOSIS — M542 Cervicalgia: Secondary | ICD-10-CM

## 2019-11-22 NOTE — Progress Notes (Signed)
Assessment and Plan:  Robert Arnold was seen today for follow-up.  Diagnoses and all orders for this visit:  PAF (paroxysmal atrial fibrillation) ([place]) Check INR and will adjust medication according to labs.  Discussed if patient falls to immediately contact office or go to ER. Discussed foods that can increase or decrease Coumadin levels. Patient understands to call the office before starting a new medication. Follow up in one month.  -     Protime-INR  Essential hypertension Continue medication Monitor blood pressure at home; call if consistently over 130/80 Continue DASH diet.   Reminder to go to the ER if any CP, SOB, nausea, dizziness, severe HA, changes vision/speech, left arm numbness and tingling and jaw pain.  Chronic systolic heart failure (HCC) Weights stable/down; appears well compensated on exam Emphasized salt restriction, less than 2000mg  a day. Encouraged daily monitoring of the patient's weight, call office if 5 lb weight loss or gain in a day.  Encouraged regular exercise. If any increasing shortness of breath, swelling, or chest pressure go to ER immediately.  decrease your fluid intake to less than 2 L daily please remember to always increase your potassium intake with any increase of your fluid pill.   Senile purpura (HCC) Reassured; secondary to 74 and coumadin; skin protection reviewed  Thrombocytopenia (HCC) -     CBC with Differential/Platelet  Hypercoagulopathy (HCC) Secondary to a. fib -     Protime-INR  Chest pain/ vs upper abdominal pain Progressive left chest vs LUQ abdominal pain as per HPI Concern with pain worse with exertion; however no other accompaniments; EKG obtained today that appears consistent with his baseline Does have some left lower rib and upper abdominal tenderness which is reassuring to r/o cardiac etiology Check Xray, upper abdominal labs, consider abdominal imaging Try steroid taper if all unremarkable for possible simple  costochondritis Advised to discuss with cardiology if persistent and not improving Go to the ER if any worsening chest pain, shortness of breath, nausea, dizziness, severe HA, changes vision/speech -     COMPLETE METABOLIC PANEL WITH GFR -     Urinalysis w microscopic + reflex cultur -     Lipase -     Amylase -     DG Chest 2 View; Future -     EKG 12-Lead   Further disposition pending results of labs. Discussed med's effects and SE's.   Over 30 minutes of exam, counseling, chart review, and critical decision making was performed.   Future Appointments  Date Time Provider [place]  12/30/2019  8:45 AM Garnet Sierras, NP [facility] None  01/31/2020  8:45 AM Vicie Mutters, PA-C [facility] None  04/18/2020  9:00 AM [name], MD [facility] None    ------------------------------------------------------------------------------------------------------------------   HPI 74 y.o.male presents for 1 month follow up on coumadin.   Today he reports intermittent L lower chest/upper abdominal pain, also corresponding area on his back, dull ache without radiating, point area, started intermittently 3-4 months ago, would resolve spontaneously after going to bed, but in the last week has been persistent. Denies any other accompaniments until yesterday, reports had severe nausea yesterday, though without emesis. Seems to ease up when he is relaxing in recliner. Describes pain as 3-4/10. He tried taking tylenol without notable benefit. Denies reflux sx, but did try unknown acid reflux medication for several weeks without improvement. Denies constipation, diarrhea. Worse with exertion. Reports current dull aching pain, constant. Denies dyspnea. Endorses fatigue but consistent with baseline. Denies diaphoresis, extremity or neck pain, dyspnea,  dizziness. Hx of CAD, NSTEMI, with bypass in 2013 without known recurrence, mobitz 1, pAF, systolic CHF. Reports has had a nagging non-productive  cough intermittently over the last year. Former smoker. Last colonoscopy in 2017, polyps, due next year. Hx of IgA nephropathy, s/p renal transplant  He has p. A. Fib, on coumadin; hx of CAD s/p stenting 09/2011 and CABG 02/2012, has done well since. 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  He has hx of systolic CHF, improved/resolved on most recent ECHO 04/2018 however with diastolic dysfunction and LVEF 50-55%.  Follows with Dr. Acie Fredrickson   Denies dyspnea on exertion, orthopnea, paroxysmal nocturnal dyspnea and edema. Positive for chest pain, chest pressure/discomfort, exertional chest pressure/discomfort and fatigue. Wt Readings from Last 3 Encounters:  11/24/19 180 lb (81.6 kg)  10/21/19 185 lb (83.9 kg)  09/15/19 187 lb (84.8 kg)   His blood pressure has been controlled at home, today their BP is BP: 140/70  He does workout.    Patient is on Coumadin for PAF (paroxysmal atrial fibrillation) ([place]) [I48.0] Patient's last INR is  Lab Results  Component Value Date   INR 3.4 (H) 10/21/2019   INR 1.4 (H) 09/15/2019   INR 2.7 (H) 08/19/2019    He is on 7.5 mg two days a week and 5 mg other days  Patient denies SOB, CP, dizziness, nose bleeds, easy bleeding, and blood in stool/urine. His coumadin dose was changed last visit. He has not taken ABX, has not missed any doses and denies a fall.     Past Medical History:  Diagnosis Date  . Adenomatous colon polyp   . Allergy   . BPH (benign prostatic hyperplasia)   . CHF (congestive heart failure) ([place])   . 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 Diabetes mellitus type 2, controlled ([place])   . FHx: heart disease 02/27/2018  . Glaucoma   . Gout   . 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 MI (myocardial infarction) ([place]) 09/26/2011  . NSTEMI (non-ST elevated myocardial infarction) ([place]) 11/26/2013   2013   . OSA (obstructive sleep apnea)   . PAF (paroxysmal atrial fibrillation) ([place])    s/p  DC-CV in 6/13. Off coumadin due to ureteral bleed  . Testosterone deficiency 02/27/2018     Allergies  Allergen Reactions  . Losartan Anaphylaxis  . Crestor [Rosuvastatin]     Elevated LFT's  . Lorazepam     Pt is unsure of reaction   . Morphine And Related Other (See Comments)    Has no effect on pt     Current Outpatient Medications on File Prior to Visit  Medication Sig  . allopurinol (ZYLOPRIM) 300 MG tablet Take 1 tablet Daily to Prevent Gout  . Ascorbic Acid (VITAMIN C) 1000 MG tablet Take 1,000 mg by mouth daily.  Marland Kitchen aspirin EC 81 MG tablet Take 81 mg by mouth daily.  Marland Kitchen atorvastatin (LIPITOR) 40 MG tablet Take 1 tablet  Daily for Cholesterol  . betamethasone dipropionate (DIPROLENE) 0.05 % cream Apply 1 application topically daily as needed (eczema).   . Black Pepper-Turmeric (TURMERIC COMPLEX/BLACK PEPPER PO) Take 1,500 mg by mouth daily.  Marland Kitchen buPROPion (WELLBUTRIN XL) 300 MG 24 hr tablet TAKE ONE TABLET BY MOUTH EVERY MORNING FOR MOOD  . Calcium Carbonate-Vitamin D (OYSTER SHELL CALCIUM/D) 250-125 MG-UNIT TABS Take by mouth.  . cyclobenzaprine (FLEXERIL) 10 MG tablet Take 1 tablet (10 mg total) by mouth 3 (three)  times daily as needed for muscle spasms.  . finasteride (PROSCAR) 5 MG tablet Take 1 tablet Daily for Prostate  . gabapentin (NEURONTIN) 300 MG capsule TAKE ONE CAPSULE BY MOUTH THREE TIMES A DAY  . glucose blood (FREESTYLE TEST STRIPS) test strip Check blood sugar 3 to 4 times daily for medication regulation.  . insulin NPH-regular Human (NOVOLIN 70/30) (70-30) 100 UNIT/ML injection Take 20-30 units 2 x day or as directed  . latanoprost (XALATAN) 0.005 % ophthalmic solution Place 1 drop into both eyes at bedtime.   Marland Kitchen MAGNESIUM-OXIDE 400 (241.3 Mg) MG tablet Take 400 mg by mouth 2 (two) times daily.   . mycophenolate (MYFORTIC) 360 MG TBEC EC tablet TAKE 1 TABLET BY MOUTH TWICE DAILY  . Probiotic Product (PROBIOTIC DAILY PO) Take 1 capsule by mouth daily.  .  tacrolimus (PROGRAF) 0.5 MG capsule Take 0.5 mg by mouth 2 (two) times daily.  . tacrolimus (PROGRAF) 1 MG capsule Take 1 mg by mouth 2 (two) times daily.  . traZODone (DESYREL) 50 MG tablet Take 1 tablet (50 mg total) by mouth at bedtime as needed for sleep.  Marland Kitchen VITAMIN D PO Take 5,000 Units by mouth. Takes two 5000 unit capsules on MWF and 1 capsule 4 days a week.  . warfarin (COUMADIN) 5 MG tablet TAKE ONE TO TWO TABLETS BY MOUTH EVERY DAY OR AS DIRECTED  . Zinc 50 MG CAPS Take 50 mg by mouth daily.   No current facility-administered medications on file prior to visit.    ROS: all negative except above.   Physical Exam:  BP 140/70   Pulse (!) 59   Temp (!) 97.5 F (36.4 C)   Wt 180 lb (81.6 kg)   SpO2 97%   BMI 27.37 kg/m   General Appearance: Well nourished, in no apparent distress. Eyes: PERRLA, EOMs, conjunctiva no swelling or erythema Sinuses: No Frontal/maxillary tenderness ENT/Mouth: Ext aud canals clear, TMs without erythema, bulging. No erythema, swelling, or exudate on post pharynx.  Tonsils not swollen or erythematous. Hearing normal.  Neck: Supple, thyroid normal.  Respiratory: Respiratory effort normal, BS equal bilaterally without rales, rhonchi, wheezing or stridor.  Cardio: Irregularly irregular with 3/6 systolic murmur. Has AV fistula to left forearm with thrill.  Brisk peripheral pulses without edema.  Abdomen: Soft, + BS.  Mildly tender to LUQ without rebound, no guarding, hernias, masses. Lymphatics: Non tender without lymphadenopathy.  Musculoskeletal: Full ROM, 5/5 strength, normal gait. He has some tenderness over left lower rib border.  Skin: Warm, dry without rashes, lesions. He has scattered ecchymosis to bil upper extremities  Neuro: Cranial nerves intact. Normal muscle tone, no cerebellar symptoms. Sensation intact.  Psych: Awake and oriented X 3, normal affect, Insight and Judgment appropriate.     Izora Ribas, NP 9:13 AM [facility]

## 2019-11-24 ENCOUNTER — Ambulatory Visit (INDEPENDENT_AMBULATORY_CARE_PROVIDER_SITE_OTHER): Payer: Medicare Other | Admitting: Adult Health

## 2019-11-24 ENCOUNTER — Encounter: Payer: Self-pay | Admitting: Adult Health

## 2019-11-24 ENCOUNTER — Other Ambulatory Visit: Payer: Self-pay

## 2019-11-24 VITALS — BP 140/70 | HR 59 | Temp 97.5°F | Wt 180.0 lb

## 2019-11-24 DIAGNOSIS — I480 Paroxysmal atrial fibrillation: Secondary | ICD-10-CM

## 2019-11-24 DIAGNOSIS — R079 Chest pain, unspecified: Secondary | ICD-10-CM

## 2019-11-24 DIAGNOSIS — D6859 Other primary thrombophilia: Secondary | ICD-10-CM

## 2019-11-24 DIAGNOSIS — D692 Other nonthrombocytopenic purpura: Secondary | ICD-10-CM | POA: Diagnosis not present

## 2019-11-24 DIAGNOSIS — I10 Essential (primary) hypertension: Secondary | ICD-10-CM | POA: Diagnosis not present

## 2019-11-24 DIAGNOSIS — I5022 Chronic systolic (congestive) heart failure: Secondary | ICD-10-CM

## 2019-11-24 DIAGNOSIS — I25708 Atherosclerosis of coronary artery bypass graft(s), unspecified, with other forms of angina pectoris: Secondary | ICD-10-CM

## 2019-11-24 DIAGNOSIS — D696 Thrombocytopenia, unspecified: Secondary | ICD-10-CM

## 2019-11-24 MED ORDER — NITROGLYCERIN 0.4 MG SL SUBL: 0.4000 mg | SUBLINGUAL_TABLET | SUBLINGUAL | 3 refills | Status: DC | PRN

## 2019-11-25 ENCOUNTER — Ambulatory Visit
Admission: RE | Admit: 2019-11-25 | Discharge: 2019-11-25 | Disposition: A | Discharge status: Home / Self Care | Payer: Medicare Other | Source: Ambulatory Visit | Attending: Adult Health | Admitting: Adult Health

## 2019-11-25 DIAGNOSIS — R079 Chest pain, unspecified: Secondary | ICD-10-CM

## 2019-11-25 LAB — URINALYSIS W MICROSCOPIC + REFLEX CULTURE
Bacteria, UA: NONE SEEN /HPF
Bilirubin Urine: NEGATIVE
Glucose, UA: NEGATIVE
Hgb urine dipstick: NEGATIVE
Hyaline Cast: NONE SEEN /LPF
Leukocyte Esterase: NEGATIVE
Nitrites, Initial: NEGATIVE
Protein, ur: NEGATIVE
Specific Gravity, Urine: 1.022 (ref 1.001–1.03)
Squamous Epithelial / HPF: NONE SEEN /HPF (ref <=5)
WBC, UA: NONE SEEN /HPF (ref 0–5)
pH: 5.5 (ref 5.0–8.0)

## 2019-11-25 LAB — CBC WITH DIFFERENTIAL/PLATELET
Absolute Monocytes: 392 cells/uL (ref 200–950)
Basophils Absolute: 40 cells/uL (ref 0–200)
Basophils Relative: 0.9 %
Eosinophils Absolute: 70 cells/uL (ref 15–500)
Eosinophils Relative: 1.6 %
HCT: 43.2 % (ref 38.5–50.0)
Hemoglobin: 14.4 g/dL (ref 13.2–17.1)
Lymphs Abs: 950 cells/uL (ref 850–3900)
MCH: 30.8 pg (ref 27.0–33.0)
MCHC: 33.3 g/dL (ref 32.0–36.0)
MCV: 92.5 fL (ref 80.0–100.0)
MPV: 9.9 fL (ref 7.5–12.5)
Monocytes Relative: 8.9 %
Neutro Abs: 2948 cells/uL (ref 1500–7800)
Neutrophils Relative %: 67 %
Platelets: 128 10*3/uL — ABNORMAL LOW (ref 140–400)
RBC: 4.67 10*6/uL (ref 4.20–5.80)
RDW: 12.5 % (ref 11.0–15.0)
Total Lymphocyte: 21.6 %
WBC: 4.4 10*3/uL (ref 3.8–10.8)

## 2019-11-25 LAB — COMPLETE METABOLIC PANEL WITH GFR
AG Ratio: 1.6 (calc) (ref 1.0–2.5)
ALT: 13 U/L (ref 9–46)
AST: 19 U/L (ref 10–35)
Albumin: 3.8 g/dL (ref 3.6–5.1)
Alkaline phosphatase (APISO): 98 U/L (ref 35–144)
BUN: 20 mg/dL (ref 7–25)
CO2: 29 mmol/L (ref 20–32)
Calcium: 10 mg/dL (ref 8.6–10.3)
Chloride: 103 mmol/L (ref 98–110)
Creat: 0.93 mg/dL (ref 0.70–1.18)
GFR, Est African American: 95 mL/min/{1.73_m2} (ref >=60)
GFR, Est Non African American: 82 mL/min/{1.73_m2} (ref >=60)
Globulin: 2.4 g/dL (calc) (ref 1.9–3.7)
Glucose, Bld: 117 mg/dL — ABNORMAL HIGH (ref 65–99)
Potassium: 4.4 mmol/L (ref 3.5–5.3)
Sodium: 139 mmol/L (ref 135–146)
Total Bilirubin: 1.7 mg/dL — ABNORMAL HIGH (ref 0.2–1.2)
Total Protein: 6.2 g/dL (ref 6.1–8.1)

## 2019-11-25 LAB — PROTIME-INR
INR: 3 — ABNORMAL HIGH
Prothrombin Time: 30.8 s — ABNORMAL HIGH (ref 9.0–11.5)

## 2019-11-25 LAB — AMYLASE: Amylase: 82 U/L (ref 21–101)

## 2019-11-25 LAB — LIPASE: Lipase: 19 U/L (ref 7–60)

## 2019-11-25 LAB — NO CULTURE INDICATED

## 2019-11-26 ENCOUNTER — Other Ambulatory Visit: Payer: Self-pay | Admitting: Adult Health

## 2019-11-26 MED ORDER — PREDNISONE 20 MG PO TABS
ORAL_TABLET | ORAL | 0 refills | Status: DC
Start: 2019-11-26 — End: 2019-12-03

## 2019-12-02 NOTE — Progress Notes (Signed)
Assessment and Plan:  Robert Arnold was seen today for abdominal pain.  Diagnoses and all orders for this visit:  Costochondral chest pain Initially with non-localizing exam, today does appear limited to chest wall Hx of smoking but recent normal xray  Patient perceives improved sx following brief steroid treatment;  Discussed icing, rest, antiinflammatories, gabapentin TID PRN He is reassured by today's exam; in agreement with monitoring Follow up if any new concerns; notably if any dyspnea, cough, fever/chills, fatigue  Further disposition pending results of labs. Discussed med's effects and SE's.   Over 15 minutes of exam, counseling, chart review, and critical decision making was performed.   Future Appointments  Date Time Provider [place]  12/30/2019  8:45 AM Garnet Sierras, NP [facility] None  01/31/2020  8:45 AM Vicie Mutters, PA-C [facility] None  04/18/2020  9:00 AM [name], MD [facility] None    ------------------------------------------------------------------------------------------------------------------   HPI BP 124/74   Pulse (!) 49   Temp (!) 96.6 F (35.9 C)   Wt 182 lb (82.6 kg)   SpO2 98%   BMI 27.67 kg/m   74 y.o.male presents for follow up on left lower chest wall pain vs LUQ abdominal pain.    Sx began 3-4 months ago with intermittent L lower chest/upper abdominal pain with corresponding area on his back; can't recall any injury or notable event prior to onset Dull ache without radiating, was intermittent but progressive, has been having daily ~3-4/10 Tried tylenol without notable benefit, tried acid reflux med famotidine 20 mg without benefit; denies burning, belching, reflux like sx. Last visit tried prednisone taper; initially reported no improvement but today reports sx have been much improved/minimal in last 3 days.   Wakes up with no pain, but typically with have pain onset around the time he is eating breakfast, will begin with  slight discomfort and persistently progressive as the day goes on, up to peak of 3-4/10, constant. Not worse with meals/food; has noted worse with lifting (50 lb water container)Denies worse with non-lifting exertion such as climbing stairs. Denies diaphoresis, extremity or neck pain, dyspnea, dizzinessDenies constipation, stools are less frequent in recent years but eating smaller portions; reports soft BMs without black/dark/blood/mucus in stools.   He is a former smoker (48 pack year, quit 1992), had negative CXR 11/25/2019. He also had CBC, CMP, lipase/amylase, UA which were unremarkable on 11/25/2019. Last colonoscopy in 2017, polyps, due next year. Hx of IgA nephropathy, s/p renal transplant and on prograf and myfortic.    Had EKG on 11/25/2019 which was consistent with baseline, no ST changes   Past Medical History:  Diagnosis Date  . Adenomatous colon polyp   . Allergy   . BPH (benign prostatic hyperplasia)   . CHF (congestive heart failure) ([place])   . 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 Diabetes mellitus type 2, controlled ([place])   . FHx: heart disease 02/27/2018  . Glaucoma   . Gout   . 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 MI (myocardial infarction) ([place]) 09/26/2011  . NSTEMI (non-ST elevated myocardial infarction) ([place]) 11/26/2013   2013   . OSA (obstructive sleep apnea)   . PAF (paroxysmal atrial fibrillation) ([place])    s/p DC-CV in 6/13. Off coumadin due to ureteral bleed  . Testosterone deficiency 02/27/2018     Allergies  Allergen Reactions  . Losartan Anaphylaxis  . Crestor [Rosuvastatin]     Elevated LFT's  . Lorazepam  Pt is unsure of reaction   . Morphine And Related Other (See Comments)    Has no effect on pt     Current Outpatient Medications on File Prior to Visit  Medication Sig  . allopurinol (ZYLOPRIM) 300 MG tablet Take 1 tablet Daily to Prevent Gout  . Ascorbic Acid (VITAMIN C) 1000 MG  tablet Take 1,000 mg by mouth daily.  Marland Kitchen aspirin EC 81 MG tablet Take 81 mg by mouth daily.  Marland Kitchen atorvastatin (LIPITOR) 40 MG tablet Take 1 tablet  Daily for Cholesterol  . betamethasone dipropionate (DIPROLENE) 0.05 % cream Apply 1 application topically daily as needed (eczema).   . Black Pepper-Turmeric (TURMERIC COMPLEX/BLACK PEPPER PO) Take 1,500 mg by mouth daily.  Marland Kitchen buPROPion (WELLBUTRIN XL) 300 MG 24 hr tablet TAKE ONE TABLET BY MOUTH EVERY MORNING FOR MOOD  . Calcium Carbonate-Vitamin D (OYSTER SHELL CALCIUM/D) 250-125 MG-UNIT TABS Take by mouth.  . cyclobenzaprine (FLEXERIL) 10 MG tablet Take 1 tablet (10 mg total) by mouth 3 (three) times daily as needed for muscle spasms.  . finasteride (PROSCAR) 5 MG tablet Take 1 tablet Daily for Prostate  . gabapentin (NEURONTIN) 300 MG capsule TAKE ONE CAPSULE BY MOUTH THREE TIMES A DAY  . glucose blood (FREESTYLE TEST STRIPS) test strip Check blood sugar 3 to 4 times daily for medication regulation.  . insulin NPH-regular Human (NOVOLIN 70/30) (70-30) 100 UNIT/ML injection Take 20-30 units 2 x day or as directed  . latanoprost (XALATAN) 0.005 % ophthalmic solution Place 1 drop into both eyes at bedtime.   Marland Kitchen MAGNESIUM-OXIDE 400 (241.3 Mg) MG tablet Take 400 mg by mouth 2 (two) times daily.   . mycophenolate (MYFORTIC) 360 MG TBEC EC tablet TAKE 1 TABLET BY MOUTH TWICE DAILY  . predniSONE (DELTASONE) 20 MG tablet 2 tablets daily for 3 days, 1 tablet daily for 4 days.  . Probiotic Product (PROBIOTIC DAILY PO) Take 1 capsule by mouth daily.  . tacrolimus (PROGRAF) 0.5 MG capsule Take 0.5 mg by mouth 2 (two) times daily.  . tacrolimus (PROGRAF) 1 MG capsule Take 1 mg by mouth 2 (two) times daily.  . traZODone (DESYREL) 50 MG tablet Take 1 tablet (50 mg total) by mouth at bedtime as needed for sleep.  Marland Kitchen VITAMIN D PO Take 5,000 Units by mouth. Takes two 5000 unit capsules on MWF and 1 capsule 4 days a week.  . warfarin (COUMADIN) 5 MG tablet TAKE ONE TO  TWO TABLETS BY MOUTH EVERY DAY OR AS DIRECTED  . Zinc 50 MG CAPS Take 50 mg by mouth daily.   No current facility-administered medications on file prior to visit.    ROS: all negative except above.   Physical Exam:  BP 124/74   Pulse (!) 49   Temp (!) 96.6 F (35.9 C)   Wt 182 lb (82.6 kg)   SpO2 98%   BMI 27.67 kg/m   General Appearance: Well nourished, in no apparent distress. Eyes: PERRLA, conjunctiva no swelling or erythema ENT/Mouth: Mask in place; Hearing normal.  Neck: Supple Respiratory: Respiratory effort normal, BS equal bilaterally without rales, rhonchi, wheezing or stridor.  Cardio: RRR with no MRGs. Brisk peripheral pulses without edema.  Abdomen: Soft, + BS.  NON TENDER TODAY, no guarding, rebound, hernias, masses. Lymphatics: Non tender without lymphadenopathy.  Musculoskeletal: Full ROM, 5/5 strength, normal gait. He has tenderness over L chest wall, along Rib #10-12 starting lateral to spine, no spinous tenderness Skin: Warm, dry without rashes, lesions, ecchymosis.  Neuro:  Normal muscle tone, Sensation intact.  Psych: Awake and oriented X 3, normal affect, Insight and Judgment appropriate.     Izora Ribas, NP 10:11 AM [facility]

## 2019-12-03 ENCOUNTER — Encounter: Payer: Self-pay | Admitting: Adult Health

## 2019-12-03 ENCOUNTER — Other Ambulatory Visit: Payer: Self-pay

## 2019-12-03 ENCOUNTER — Ambulatory Visit (INDEPENDENT_AMBULATORY_CARE_PROVIDER_SITE_OTHER): Payer: Medicare Other | Admitting: Adult Health

## 2019-12-03 VITALS — BP 124/74 | HR 49 | Temp 96.6°F | Wt 182.0 lb

## 2019-12-03 DIAGNOSIS — I25708 Atherosclerosis of coronary artery bypass graft(s), unspecified, with other forms of angina pectoris: Secondary | ICD-10-CM | POA: Diagnosis not present

## 2019-12-03 DIAGNOSIS — R0789 Other chest pain: Secondary | ICD-10-CM

## 2019-12-03 NOTE — Patient Instructions (Signed)
    Voltaren gel 2-3 times a day  Ice 15-20 if feeling particularly inflamed  Consider sal[place]as if trouble sleeping -   Try restarting soluble fiber supplement - Citrucel or benefit     Chest Wall Pain Chest wall pain is pain in or around the bones and muscles of your chest. Chest wall pain may be caused by:  An injury.  Coughing a lot.  Using your chest and arm muscles too much. Sometimes, the cause may not be known. This pain may take a few weeks or longer to get better. Follow these instructions at home: Managing pain, stiffness, and swelling If told, put ice on the painful area:  Put ice in a plastic bag.  Place a towel between your skin and the bag.  Leave the ice on for 20 minutes, 2-3 times a day.  Activity  Rest as told by your doctor.  Avoid doing things that cause pain. This includes lifting heavy items.  Ask your doctor what activities are safe for you. General instructions   Take over-the-counter and prescription medicines only as told by your doctor.  Do not use any products that contain nicotine or tobacco, such as cigarettes, e-cigarettes, and chewing tobacco. If you need help quitting, ask your doctor.  Keep all follow-up visits as told by your doctor. This is important. Contact a doctor if:  You have a fever.  Your chest pain gets worse.  You have new symptoms. Get help right away if:  You feel sick to your stomach (nauseous) or you throw up (vomit).  You feel sweaty or light-headed.  You have a cough with mucus from your lungs (sputum) or you cough up blood.  You are short of breath. These symptoms may be an emergency. Do not wait to see if the symptoms will go away. Get medical help right away. Call your local emergency services (911 in the [place] Do not drive yourself to the hospital. Summary  Chest wall pain is pain in or around the bones and muscles of your chest.  It may be treated with ice, rest, and medicines. Your condition  may also get better if you avoid doing things that cause pain.  Contact a doctor if you have a fever, chest pain that gets worse, or new symptoms.  Get help right away if you feel light-headed or you get short of breath. These symptoms may be an emergency. This information is not intended to replace advice given to you by your health care provider. Make sure you discuss any questions you have with your health care provider. Document Revised: 09/25/2017 Document Reviewed: 09/25/2017 Elsevier Patient Education  2020 [employer].

## 2019-12-15 ENCOUNTER — Other Ambulatory Visit: Payer: Self-pay | Admitting: Internal Medicine

## 2019-12-15 DIAGNOSIS — N138 Other obstructive and reflux uropathy: Secondary | ICD-10-CM

## 2019-12-16 LAB — HM DIABETES EYE EXAM

## 2019-12-18 ENCOUNTER — Other Ambulatory Visit: Payer: Self-pay | Admitting: Internal Medicine

## 2019-12-18 DIAGNOSIS — M542 Cervicalgia: Secondary | ICD-10-CM

## 2019-12-29 NOTE — Progress Notes (Addendum)
Addendum: 12/31/19  INR is 3.8, Dose reduction, Coumadin 5mg , one tablet daily.     FOLLOW UP: INR  Assessment:   Essential hypertension Continue current medications:  Monitor blood pressure at home; call if consistently over 130/80 Continue DASH diet.   Reminder to go to the ER if any CP, SOB, nausea, dizziness, severe HA, changes vision/speech, left arm numbness and tingling and jaw pain. -     CBC with Differential/Platelet  PAF (paroxysmal atrial fibrillation) ([place]) Long term current use of anticoagulant therapy Check INR Q month and will adjust medication according to labs.  Currently taking Coumadin 7.5mg  one day a week and 5mg  sicx days a week. Discussed if patient falls to immediately contact office or go to ER. Discussed foods that can increase or decrease Coumadin levels. Patient understands to call the office before starting a new medication. Follow up [ending results  Thrombocytopenia, acuired [facility]) Monitoring PT/INR No S&S of bleeding Discussed hospital precautions  Type 2 diabetes mellitus with stage 2 chronic kidney disease, with long-term current use of insulin (HCC) Continue medications: NPH, 20-30 units BID, no changes well controlled Discussed general issues about diabetes pathophysiology and management. Education: Reviewed 'ABCs' of diabetes management (respective goals in parentheses):  A1C (<7), blood pressure (<130/80), and cholesterol (LDL <70) Dietary recommendations Encouraged aerobic exercise.  Discussed foot care, check daily Yearly retinal exam Dental exam every 6 months Monitor blood glucose, discussed goal for patient   CKD stage 2 due to type 2 diabetes mellitus ([place]) Increase fluids  Avoid NSAIDS Blood pressure control Monitor sugars  Will continue to monitor  Senile purpura ([place]) Discussed process, protect skin, sunscreen  Costochondral chest pain, Left Unchanged, remains point tenderness Discussed next step being MRI to look at soft  tissue? declines this right now Continue flexeril, voltaren topical, ice Provided printed education regarding stretching Contact office with any new or worsening symptoms  Medication management Continued   Over 20 minutes of face to face interview, exam, counseling, chart review, and critical decision making was performed  Future Appointments  Date Time Provider [place]  01/31/2020  8:45 AM Garnet Sierras, NP [facility] None  04/18/2020  9:00 AM [name], MD [facility] None     Plan:   During the course of the visit the patient was educated and counseled about appropriate screening and preventive services including:    Pneumococcal vaccine   Influenza vaccine  Prevnar 13  Td vaccine  Screening electrocardiogram  Colorectal cancer screening  Diabetes screening  Glaucoma screening  Nutrition counseling    Subjective:  Robert Arnold is a 74 y.o. male who presents for follow up for INR check.  He reports that he has right sided rib pain.  He reports this has been ongoing for several months.  He reports the pain is not present in the morning and does not wake him from sleep.  He reports that when the pain comes on it is 6-7/10 and it is achy in quality.  He reports that it stay constant throughout the day.  He reports that the pain does radiate directly posterior but does not wrap around his ribs.  He does have a point of tenderness to the lateral 8th rib?  He reports he has done a short course of steroids that helped some but it comes back .  He has also used a topical cream seemed to help some.  He reports that he has also used cyclobenzaprine that provided relief about two hours later.  BMI is Body mass index is 27.98 kg/m., he has been working on diet and exercise. Wt Readings from Last 3 Encounters:  12/30/19 184 lb (83.5 kg)  12/03/19 182 lb (82.6 kg)  11/24/19 180 lb (81.6 kg)   He has p. A. Fib, on coumadin; hx of CAD s/p stenting 09/2011  and CABG 02/2012, has done well since. 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  He has hx of systolic CHF, improved/resolved on most recent ECHO 04/2018 however with diastolic dysfunction and LVEF 50-55%.  Follows with Dr. Acie Fredrickson   His blood pressure has been controlled at home (recently running 130/75), today their BP is BP: 120/66 He does not workout, works around the house. He denies chest pain, shortness of breath, dizziness.   He has history of Afib and is on coumadin, he is on 7.5 mg one days a week and 5 mg six days a week.  He was over goal last OV; no nose bleeds, blood stool/urine. No missed doses, no ABX.  Lab Results  Component Value Date   INR 1.4 (H) 09/15/2019   INR 2.7 (H) 08/19/2019   INR 2.4 (H) 07/19/2019      He has been working on diet and exercise for well controlled T2 diabetes on novolin 70/30 22 units AM, 12-15 units at night, and denies increased appetite, nausea, paresthesia of the feet, polydipsia, polyuria and visual disturbances.  Does have occasional low glucose, always in the afternoon.  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  On prograf and myfortic, follows [facility]  Now back in CKD 2 stage;  Lab Results  Component Value Date   [facility] 82 11/24/2019     Medication Review: Current Outpatient Medications on File Prior to Visit  Medication Sig Dispense Refill  . allopurinol (ZYLOPRIM) 300 MG tablet Take 1 tablet Daily to Prevent Gout 90 tablet 0  . Ascorbic Acid (VITAMIN C) 1000 MG tablet Take 1,000 mg by mouth daily.    Marland Kitchen aspirin EC 81 MG tablet Take 81 mg by mouth daily.    Marland Kitchen atorvastatin (LIPITOR) 40 MG tablet Take 1 tablet  Daily for Cholesterol 90 tablet 3  . betamethasone dipropionate (DIPROLENE) 0.05 % cream Apply 1 application topically daily as needed (eczema).     .  Black Pepper-Turmeric (TURMERIC COMPLEX/BLACK PEPPER PO) Take 1,500 mg by mouth daily.    Marland Kitchen buPROPion (WELLBUTRIN XL) 300 MG 24 hr tablet TAKE ONE TABLET BY MOUTH EVERY MORNING FOR MOOD 90 tablet 2  . Calcium Carbonate-Vitamin D (OYSTER SHELL CALCIUM/D) 250-125 MG-UNIT TABS Take by mouth.    . finasteride (PROSCAR) 5 MG tablet Take 1 tablet     Daily     for Prostate 90 tablet 0  . gabapentin (NEURONTIN) 300 MG capsule TAKE ONE CAPSULE BY MOUTH THREE TIMES A DAY AS NEEDED FOR PAIN 90 capsule 1  . glucose blood (FREESTYLE TEST STRIPS) test strip Check blood sugar 3 to 4 times daily for medication regulation. 450 each PRN  . insulin NPH-regular Human (NOVOLIN 70/30) (70-30) 100 UNIT/ML injection Take 20-30 units 2 x day or as directed 10 mL 99  . latanoprost (XALATAN) 0.005 % ophthalmic solution Place 1 drop into both eyes at bedtime.   4  . MAGNESIUM-OXIDE 400 (241.3 Mg) MG tablet Take 400 mg by  mouth 2 (two) times daily.   6  . mycophenolate (MYFORTIC) 360 MG TBEC EC tablet TAKE 1 TABLET BY MOUTH TWICE DAILY 180 tablet 1  . Probiotic Product (PROBIOTIC DAILY PO) Take 1 capsule by mouth daily.    . tacrolimus (PROGRAF) 0.5 MG capsule Take 0.5 mg by mouth 2 (two) times daily.    . tacrolimus (PROGRAF) 1 MG capsule Take 1 mg by mouth 2 (two) times daily.    . traZODone (DESYREL) 50 MG tablet Take 1 tablet (50 mg total) by mouth at bedtime as needed for sleep. 90 tablet 3  . VITAMIN D PO Take 5,000 Units by mouth. Takes two 5000 unit capsules on MWF and 1 capsule 4 days a week.    . warfarin (COUMADIN) 5 MG tablet TAKE ONE TO TWO TABLETS BY MOUTH EVERY DAY OR AS DIRECTED 180 tablet 2  . Zinc 50 MG CAPS Take 50 mg by mouth daily.     No current facility-administered medications on file prior to visit.    Allergies: Allergies  Allergen Reactions  . Losartan Anaphylaxis  . Crestor [Rosuvastatin]     Elevated LFT's  . Lorazepam     Pt is unsure of reaction   . Morphine And Related Other (See  Comments)    Has no effect on pt     Current Problems (verified) has Chronic systolic heart failure ([place]); CKD stage 2 due to type 2 diabetes mellitus ([place]); Hyperlipidemia associated with type 2 diabetes mellitus ([place]); BPH with obstruction/lower urinary tract symptoms; Gout; PAF (paroxysmal atrial fibrillation) ([place]); Essential hypertension; Vitamin D deficiency; Anticoagulant long-term use; Renal Transplant, s/p 09/2011; Depression, major, in remission ([place]); Thrombocytopenia ([place]); CAD (coronary artery disease) of artery bypass graft; IgA nephropathy; Essential tremor; Overweight (BMI 25.0-29.9); Senile purpura ([place]); Type 2 diabetes mellitus with stage 2 chronic kidney disease, with long-term current use of insulin ([place]); Former smoker (quit 1992); HNP (herniated nucleus pulposus), cervical; S/P cervical spinal fusion; Chronic hip pain, bilateral; Hypercoagulopathy ([place]); [place] I; A-V fistula ([place]); and Trochanteric bursitis of left hip on their problem list.  Screening Tests Immunization History  Administered Date(s) Administered  . DT (Pediatric) 06/05/2015  . Hepatitis B 04/08/2009  . Influenza, High Dose Seasonal PF 12/27/2013, 01/17/2015, 01/11/2016, 01/02/2018, 12/21/2018  . Influenza-Unspecified 12/25/2016  . PFIZER SARS-COV-2 Vaccination 07/24/2019, 08/17/2019  . Pneumococcal Conjugate-13 10/25/2013  . Pneumococcal Polysaccharide-23 06/05/2015  . Pneumococcal-Unspecified 04/08/2008  . Tdap 04/08/2008  . Zoster Recombinat (Shingrix) 02/12/2018, 06/02/2018   Preventative care: Last colonoscopy: 03/2016, 5 year follow up  CT AB 2012 CT chest 2010 Sleep study 2013 Echo 02/2013, 05/2977, normal systolic, diastolic dysfunction, LVEF 50-55% Stress test 01/2012  CXR 10/2016  Prior vaccinations: TD or Tdap: 2017  Influenza: 2020 Pneumococcal: 2017 Prevnar13: 2015 Shingles/Zostavax: had 2/2, shingrix  Covid 19: had 2/2, 2021, pfizer  Names of Other Physician/Practitioners you  currently use: 1. [facility] here for primary care 2. Dr. Nicki Reaper, [facility], last visit 12/15/18, DM no retinopathy  3. Dr. Mirna Mires, dentist, last visit, 2019 q 6 months, overdue   Patient Care Team: [name], MD as PCP - General (Internal Medicine) Nahser, Wonda Cheng, MD as PCP - Cardiology (Cardiology) Estanislado Emms, MD as Consulting Physician (Nephrology) Bensimhon, Shaune Pascal, MD as Consulting Physician (Cardiology) Estevan Ryder, MD as Referring Physician (Cardiology) Inda Castle, MD (Inactive) as Consulting Physician (Gastroenterology) Macarthur Critchley, OD as Referring Physician (Optometry)  Surgical: He  has a past  surgical history that includes Colonoscopy; Peritoneal catheter insertion (2011); AV fistula placement (2011); Kidney transplant (09/25/2011); Coronary angioplasty with stent; Coronary artery bypass graft (2013); and Anterior cervical decomp/discectomy fusion (N/A, 05/04/2018). Family His family history includes COPD in his mother; Diabetes in his mother; Heart attack in his father; Kidney disease in his sister. Social history  He reports that he quit smoking about 29 years ago. His smoking use included cigarettes. He started smoking about 53 years ago. He has a 48.00 pack-year smoking history. He has never used smokeless tobacco. He reports current alcohol use. He reports that he does not use drugs.    Objective:   Today's Vitals   12/30/19 0845  BP: 120/66  Pulse: 82  Temp: 97.6 F (36.4 C)  SpO2: 94%  Weight: 184 lb (83.5 kg)   Body mass index is 27.98 kg/m.  General Appearance: Well nourished, in no apparent distress. Eyes: PERRLA, EOMs, conjunctiva no swelling or erythema Sinuses: No Frontal/maxillary tenderness ENT/Mouth: Ext aud canals clear, TMs without erythema, bulging. No erythema, swelling, or exudate on post pharynx.  Tonsils not swollen or erythematous. Hearing normal.  Neck: Supple, thyroid  normal.  Respiratory: Respiratory effort normal, BS equal bilaterally without rales, rhonchi, wheezing or stridor.  Cardio: Irregularly irregular, 3/6 early systolic blowing murmur. Brisk LE pulses, without edema. Has AV fistula to left forearm with thrill. Sensation intact, cap refill brisk Abdomen: Soft, + BS.  Non tender, no guarding, rebound, hernias, masses.  Point tenderness to left 8-9 intercostal space, no erythema or mass noted. Lymphatics: Non tender without lymphadenopathy.  Musculoskeletal: Full ROM, 5/5 strength, normal gait. L greater trochanteric tenderness without heat or redness Skin: Warm, dry without rashes, lesions; small ecchymoses to bilateral upper extremities. Neuro: Cranial nerves intact. Normal muscle tone, no cerebellar symptoms.  Psych: Awake and oriented X 3, normal affect, Insight and Judgment appropriate.    Garnet Sierras, Laqueta Jean, DNP [facility] 12/30/2019  11:32 AM

## 2019-12-30 ENCOUNTER — Other Ambulatory Visit: Payer: Self-pay

## 2019-12-30 ENCOUNTER — Encounter: Payer: Self-pay | Admitting: Adult Health Nurse Practitioner

## 2019-12-30 ENCOUNTER — Ambulatory Visit (INDEPENDENT_AMBULATORY_CARE_PROVIDER_SITE_OTHER): Payer: Medicare Other | Admitting: Adult Health Nurse Practitioner

## 2019-12-30 VITALS — BP 120/66 | HR 82 | Temp 97.6°F | Wt 184.0 lb

## 2019-12-30 DIAGNOSIS — Z79899 Other long term (current) drug therapy: Secondary | ICD-10-CM

## 2019-12-30 DIAGNOSIS — M542 Cervicalgia: Secondary | ICD-10-CM

## 2019-12-30 DIAGNOSIS — R0789 Other chest pain: Secondary | ICD-10-CM

## 2019-12-30 DIAGNOSIS — I5022 Chronic systolic (congestive) heart failure: Secondary | ICD-10-CM | POA: Diagnosis not present

## 2019-12-30 DIAGNOSIS — I10 Essential (primary) hypertension: Secondary | ICD-10-CM | POA: Diagnosis not present

## 2019-12-30 DIAGNOSIS — D692 Other nonthrombocytopenic purpura: Secondary | ICD-10-CM

## 2019-12-30 DIAGNOSIS — I480 Paroxysmal atrial fibrillation: Secondary | ICD-10-CM | POA: Diagnosis not present

## 2019-12-30 MED ORDER — CYCLOBENZAPRINE HCL 10 MG PO TABS: 10.0000 mg | ORAL_TABLET | Freq: Three times a day (TID) | ORAL | 1 refills | Status: DC | PRN

## 2019-12-31 LAB — CBC WITH DIFFERENTIAL/PLATELET
Absolute Monocytes: 461 cells/uL (ref 200–950)
Basophils Absolute: 59 cells/uL (ref 0–200)
Basophils Relative: 1.2 %
Eosinophils Absolute: 88 cells/uL (ref 15–500)
Eosinophils Relative: 1.8 %
HCT: 44.2 % (ref 38.5–50.0)
Hemoglobin: 14.6 g/dL (ref 13.2–17.1)
Lymphs Abs: 1147 cells/uL (ref 850–3900)
MCH: 30.9 pg (ref 27.0–33.0)
MCHC: 33 g/dL (ref 32.0–36.0)
MCV: 93.6 fL (ref 80.0–100.0)
MPV: 9.9 fL (ref 7.5–12.5)
Monocytes Relative: 9.4 %
Neutro Abs: 3146 cells/uL (ref 1500–7800)
Neutrophils Relative %: 64.2 %
Platelets: 138 10*3/uL — ABNORMAL LOW (ref 140–400)
RBC: 4.72 10*6/uL (ref 4.20–5.80)
RDW: 12.4 % (ref 11.0–15.0)
Total Lymphocyte: 23.4 %
WBC: 4.9 10*3/uL (ref 3.8–10.8)

## 2019-12-31 LAB — PROTIME-INR
INR: 3.8 — ABNORMAL HIGH
Prothrombin Time: 37.9 s — ABNORMAL HIGH (ref 9.0–11.5)

## 2020-01-09 ENCOUNTER — Other Ambulatory Visit: Payer: Self-pay | Admitting: Physician Assistant

## 2020-01-30 NOTE — Progress Notes (Signed)
FOLLOW UP: INR  Assessment:   Essential hypertension Well controlled no medications Monitor blood pressure at home; call if consistently over 130/80 Continue DASH diet.   Reminder to go to the ER if any CP, SOB, nausea, dizziness, severe HA, changes vision/speech, left arm numbness and tingling and jaw pain. -     CBC with Differential/Platelet  PAF (paroxysmal atrial fibrillation) ([place]) Long term current use of anticoagulant therapy Check INR Q month and will adjust medication according to labs.  Currently taking Coumadin 5mg  daily Discussed if patient falls to immediately contact office or go to ER. Discussed foods that can increase or decrease Coumadin levels. Patient understands to call the office before starting a new medication. Follow up [ending results  Thrombocytopenia, acuired [facility]) Monitoring PT/INR No S&S of bleeding Discussed hospital precautions  Type 2 diabetes mellitus with stage 2 chronic kidney disease, with long-term current use of insulin (HCC) Continue medications: NPH, 20-30 units BID, no changes well controlled Foot Exam Discussed general issues about diabetes pathophysiology and management. Education: Reviewed 'ABCs' of diabetes management (respective goals in parentheses):  A1C (<7), blood pressure (<130/80), and cholesterol (LDL <70) Dietary recommendations Encouraged aerobic exercise.  Discussed foot care, check daily Yearly retinal exam Dental exam every 6 months Monitor blood glucose, discussed goal for patient   CKD stage 2 due to type 2 diabetes mellitus ([place]) Increase fluids  Avoid NSAIDS Blood pressure control Monitor sugars  Will continue to monitor  Senile purpura ([place]) Discussed process, protect skin, sunscreen  Medication management Continued   Over 20 minutes of face to face interview, exam, counseling, chart review, and critical decision making was performed  Future Appointments  Date Time Provider [place]   04/18/2020  9:00 AM [name], MD [facility] None     Subjective:  Robert Arnold is a 74 y.o. male who presents for follow up for INR check.  He recently had appointment with [facility].  Report that he has had some dizzy episode that is short in duration and main after getting up quickly.  He is not checking his blood pressure at home and getting readings in 120's over 70's. He has not been taking note of his pulse but was going to start to monitor this.  Denies any headaches chest pains or shortness of breath.  He is not taking any hypertensive agents at this time.  BMI is Body mass index is 28.13 kg/m., he has been working on diet and exercise. Wt Readings from Last 3 Encounters:  01/31/20 185 lb (83.9 kg)  12/30/19 184 lb (83.5 kg)  12/03/19 182 lb (82.6 kg)   He has p. A. Fib, on coumadin; hx of CAD s/p stenting 09/2011 and CABG 02/2012, has done well since. 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  He has hx of systolic CHF, improved/resolved on most recent ECHO 04/2018 however with diastolic dysfunction and LVEF 50-55%.  Follows with Dr. Acie Fredrickson   His blood pressure has been controlled at home (recently running 122/72), today their BP is BP: 122/72 He does not workout, works around the house. He denies chest pain, shortness of breath.  He has history of Afib and is on coumadin, he is on 7.5 mg one days a week and 5 mg six days a week.  He was over goal last OV; no nose bleeds, blood stool/urine. No missed doses, no ABX.  Lab Results  Component Value Date   INR 1.4 (H) 09/15/2019   INR 2.7 (H) 08/19/2019  INR 2.4 (H) 07/19/2019      He has been working on diet and exercise for well controlled T2 diabetes on novolin 70/30 22 units AM (20-30), 12-15 units at night, and denies increased appetite, nausea, paresthesia of the feet, polydipsia, polyuria and visual disturbances.  Does have occasional low glucose, always in the afternoon.  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  On prograf and myfortic, follows [facility]  Now back in CKD 2 stage;  Lab Results  Component Value Date   [facility] 82 11/24/2019     Medication Review: Current Outpatient Medications on File Prior to Visit  Medication Sig Dispense Refill  . allopurinol (ZYLOPRIM) 300 MG tablet Take 1 tablet Daily to Prevent Gout 90 tablet 0  . Ascorbic Acid (VITAMIN C) 1000 MG tablet Take 1,000 mg by mouth daily.    Marland Kitchen aspirin EC 81 MG tablet Take 81 mg by mouth daily.    Marland Kitchen atorvastatin (LIPITOR) 40 MG tablet Take 1 tablet  Daily for Cholesterol 90 tablet 3  . betamethasone dipropionate (DIPROLENE) 0.05 % cream Apply 1 application topically daily as needed (eczema).     . Black Pepper-Turmeric (TURMERIC COMPLEX/BLACK PEPPER PO) Take 1,500 mg by mouth daily.    Marland Kitchen buPROPion (WELLBUTRIN XL) 300 MG 24 hr tablet TAKE ONE TABLET BY MOUTH EVERY MORNING FOR MOOD 90 tablet 2  . Calcium Carbonate-Vitamin D (OYSTER SHELL CALCIUM/D) 250-125 MG-UNIT TABS Take by mouth.    . cyclobenzaprine (FLEXERIL) 10 MG tablet Take 1 tablet (10 mg total) by mouth 3 (three) times daily as needed for muscle spasms. 60 tablet 1  . finasteride (PROSCAR) 5 MG tablet Take 1 tablet     Daily     for Prostate 90 tablet 0  . gabapentin (NEURONTIN) 300 MG capsule TAKE ONE CAPSULE BY MOUTH THREE TIMES A DAY AS NEEDED FOR PAIN 90 capsule 1  . glucose blood (FREESTYLE TEST STRIPS) test strip Check blood sugar 3 to 4 times daily for medication regulation. 450 each PRN  . insulin NPH-regular Human (NOVOLIN 70/30) (70-30) 100 UNIT/ML injection Take 20-30 units 2 x day or as directed 10 mL 99  . latanoprost (XALATAN) 0.005 % ophthalmic solution Place 1 drop into both eyes at bedtime.   4  . MAGNESIUM-OXIDE 400 (241.3 Mg) MG tablet Take 400 mg by mouth 2 (two) times  daily.   6  . mycophenolate (MYFORTIC) 360 MG TBEC EC tablet TAKE 1 TABLET BY MOUTH TWICE DAILY 180 tablet 1  . Probiotic Product (PROBIOTIC DAILY PO) Take 1 capsule by mouth daily.    . tacrolimus (PROGRAF) 0.5 MG capsule Take 0.5 mg by mouth 2 (two) times daily.    . tacrolimus (PROGRAF) 1 MG capsule Take 1 mg by mouth 2 (two) times daily.    . traZODone (DESYREL) 50 MG tablet Take      1 tablet       1 hour before        Bedtime as needed for Sleep 90 tablet 0  . VITAMIN D PO Take 5,000 Units by mouth. Takes two 5000 unit capsules on MWF and 1 capsule 4 days a week.    . warfarin (COUMADIN) 5 MG tablet TAKE ONE TO TWO TABLETS BY MOUTH EVERY DAY OR AS DIRECTED 180 tablet  2  . Zinc 50 MG CAPS Take 50 mg by mouth daily.     No current facility-administered medications on file prior to visit.    Allergies: Allergies  Allergen Reactions  . Losartan Anaphylaxis  . Crestor [Rosuvastatin]     Elevated LFT's  . Lorazepam     Pt is unsure of reaction   . Morphine And Related Other (See Comments)    Has no effect on pt     Current Problems (verified) has Chronic systolic heart failure ([place]); CKD stage 2 due to type 2 diabetes mellitus ([place]); Hyperlipidemia associated with type 2 diabetes mellitus ([place]); BPH with obstruction/lower urinary tract symptoms; Gout; PAF (paroxysmal atrial fibrillation) ([place]); Essential hypertension; Vitamin D deficiency; Anticoagulant long-term use; Renal Transplant, s/p 09/2011; Depression, major, in remission ([place]); Thrombocytopenia ([place]); CAD (coronary artery disease) of artery bypass graft; IgA nephropathy; Essential tremor; Overweight (BMI 25.0-29.9); Senile purpura ([place]); Type 2 diabetes mellitus with stage 2 chronic kidney disease, with long-term current use of insulin ([place]); Former smoker (quit 1992); HNP (herniated nucleus pulposus), cervical; S/P cervical spinal fusion; Chronic hip pain, bilateral; Hypercoagulopathy ([place]); Mobitz I; A-V fistula ([place]); and  Trochanteric bursitis of left hip on their problem list.  Screening Tests Immunization History  Administered Date(s) Administered  . DT (Pediatric) 06/05/2015  . Hepatitis B 04/08/2009  . Influenza, High Dose Seasonal PF 12/27/2013, 01/17/2015, 01/11/2016, 01/02/2018, 12/21/2018  . Influenza-Unspecified 12/25/2016  . PFIZER SARS-COV-2 Vaccination 07/24/2019, 08/17/2019  . Pneumococcal Conjugate-13 10/25/2013  . Pneumococcal Polysaccharide-23 06/05/2015  . Pneumococcal-Unspecified 04/08/2008  . Tdap 04/08/2008  . Zoster Recombinat (Shingrix) 02/12/2018, 06/02/2018   Preventative care: Last colonoscopy: 03/2016, 5 year follow up  CT AB 2012 CT chest 2010 Sleep study 2013 Echo 02/2013, 06/3823, normal systolic, diastolic dysfunction, LVEF 50-55% Stress test 01/2012  CXR 10/2016  Prior vaccinations: TD or Tdap: 2017  Influenza: 01/2020 Pneumococcal: 2017 Prevnar13: 2015 Shingles/Zostavax: had 2/2, shingrix  Covid 19: had 2/2, 2021, pfizer  Names of Other Physician/Practitioners you currently use: 1. [facility] here for primary care 2. Dr. Nicki Reaper, [facility], last visit 12/15/19, DM no retinopathy  3. Dr. Mirna Mires, dentist, last visit, 2019 q 6 months, overdue   Patient Care Team: [name], MD as PCP - General (Internal Medicine) Nahser, Wonda Cheng, MD as PCP - Cardiology (Cardiology) Estanislado Emms, MD as Consulting Physician (Nephrology) Bensimhon, Shaune Pascal, MD as Consulting Physician (Cardiology) Estevan Ryder, MD as Referring Physician (Cardiology) Inda Castle, MD (Inactive) as Consulting Physician (Gastroenterology) Macarthur Critchley, OD as Referring Physician (Optometry)  Surgical: He  has a past surgical history that includes Colonoscopy; Peritoneal catheter insertion (2011); AV fistula placement (2011); Kidney transplant (09/25/2011); Coronary angioplasty with stent; Coronary artery bypass graft (2013); and Anterior  cervical decomp/discectomy fusion (N/A, 05/04/2018). Family His family history includes COPD in his mother; Diabetes in his mother; Heart attack in his father; Kidney disease in his sister. Social history  He reports that he quit smoking about 29 years ago. His smoking use included cigarettes. He started smoking about 53 years ago. He has a 48.00 pack-year smoking history. He has never used smokeless tobacco. He reports current alcohol use. He reports that he does not use drugs.    Objective:   Today's Vitals   01/31/20 0845  BP: 122/72  Pulse: (!) 45  Temp: (!) 97.5 F (36.4 C)  SpO2: 96%  Weight: 185 lb (83.9 kg)   Body mass index is 28.13 kg/m.  General Appearance: Well nourished, in no apparent distress. Eyes: PERRLA, EOMs, conjunctiva no swelling or erythema Sinuses: No Frontal/maxillary tenderness ENT/Mouth: Ext aud canals clear, TMs without erythema, bulging. No erythema, swelling, or exudate on post pharynx.  Tonsils not swollen or erythematous. Hearing normal.  Neck: Supple, thyroid normal.  Respiratory: Respiratory effort normal, BS equal bilaterally without rales, rhonchi, wheezing or stridor.  Cardio: Irregularly irregular, 3/6 early systolic blowing murmur. Brisk LE pulses, without edema. Has AV fistula to left forearm with thrill. Sensation intact, cap refill brisk Abdomen: Soft, + BS.  Non tender, no guarding, rebound, hernias, masses.  Point tenderness to left 8-9 intercostal space, no erythema or mass noted. Lymphatics: Non tender without lymphadenopathy.  Musculoskeletal: Full ROM, 5/5 strength, normal gait. L greater trochanteric tenderness without heat or redness Skin: Warm, dry without rashes, lesions; small ecchymoses to bilateral upper extremities. Neuro: Cranial nerves intact. Normal muscle tone, no cerebellar symptoms.  Psych: Awake and oriented X 3, normal affect, Insight and Judgment appropriate.    Garnet Sierras, Laqueta Jean, DNP [facility] 01/31/2020  9:08 AM

## 2020-01-31 ENCOUNTER — Ambulatory Visit (INDEPENDENT_AMBULATORY_CARE_PROVIDER_SITE_OTHER): Payer: Medicare Other | Admitting: Adult Health Nurse Practitioner

## 2020-01-31 ENCOUNTER — Encounter: Payer: Self-pay | Admitting: Adult Health Nurse Practitioner

## 2020-01-31 ENCOUNTER — Other Ambulatory Visit: Payer: Self-pay

## 2020-01-31 VITALS — BP 122/72 | HR 45 | Temp 97.5°F | Wt 185.0 lb

## 2020-01-31 DIAGNOSIS — I10 Essential (primary) hypertension: Secondary | ICD-10-CM | POA: Diagnosis not present

## 2020-01-31 DIAGNOSIS — D692 Other nonthrombocytopenic purpura: Secondary | ICD-10-CM | POA: Diagnosis not present

## 2020-01-31 DIAGNOSIS — Z794 Long term (current) use of insulin: Secondary | ICD-10-CM

## 2020-01-31 DIAGNOSIS — Z23 Encounter for immunization: Secondary | ICD-10-CM

## 2020-01-31 DIAGNOSIS — Z79899 Other long term (current) drug therapy: Secondary | ICD-10-CM

## 2020-01-31 DIAGNOSIS — D696 Thrombocytopenia, unspecified: Secondary | ICD-10-CM

## 2020-01-31 DIAGNOSIS — I480 Paroxysmal atrial fibrillation: Secondary | ICD-10-CM | POA: Diagnosis not present

## 2020-01-31 DIAGNOSIS — E1122 Type 2 diabetes mellitus with diabetic chronic kidney disease: Secondary | ICD-10-CM

## 2020-01-31 DIAGNOSIS — N182 Chronic kidney disease, stage 2 (mild): Secondary | ICD-10-CM

## 2020-02-01 LAB — PROTIME-INR
INR: 1.9 — ABNORMAL HIGH
Prothrombin Time: 19.8 s — ABNORMAL HIGH (ref 9.0–11.5)

## 2020-02-08 ENCOUNTER — Encounter: Payer: Self-pay | Admitting: Internal Medicine

## 2020-02-14 ENCOUNTER — Other Ambulatory Visit: Payer: Self-pay

## 2020-02-14 DIAGNOSIS — M542 Cervicalgia: Secondary | ICD-10-CM

## 2020-02-14 MED ORDER — GABAPENTIN 300 MG PO CAPS: ORAL_CAPSULE | ORAL | 1 refills | Status: DC

## 2020-03-04 NOTE — Progress Notes (Signed)
C  A  N  C  E  L  L  E   D  Morning of Appt   & declined to Reschedule                                                                                                                                                       This very nice 74 y.o. MWM  presents for 3 month & Coag  follow up with HTN, ASHD /CABG /pAfib, HLD, Pre-Diabetes and Vitamin D Deficiency.  Patient is on low dose Allopurinol for his Gout.       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        Patient is treated for HTN & BP has been controlled at home. Today's  .  Post-op  his kidney transplant, he had acute MI with PCS/Stenting.  He is due PT/INR monthly monitoring for his Coumadin today. Patient has had no complaints of any cardiac type chest pain, palpitations,  dyspnea / orthopnea / PND, dizziness, claudication, or dependent edema.       Hyperlipidemia is controlled with diet & meds. Patient denies myalgias or other med SE's. Last Lipids were at goal:  Lab Results  Component Value Date   CHOL 127 07/19/2019   HDL 58 07/19/2019   LDLCALC 56 07/19/2019   TRIG 54 07/19/2019   CHOLHDL 2.2 07/19/2019    Also, the patient has history of T2_NIDDM predating from 2003 , treated with Metformin from 2007  til his Kidney transplant and since then treated with Insulin . He denies any  symptoms of reactive hypoglycemia, diabetic polys, paresthesias or visual blurring.  Last A1c was  Normal & at goal:  Lab Results  Component Value Date   HGBA1C 5.5 07/19/2019           Further, the patient also has history of Vitamin D Deficiency ("24" /2014) and supplements vitamin D without any suspected side-effects. Last vitamin D was at goal:  Lab Results  Component Value Date   VD25OH 80 07/19/2019     Assessment and Plan:  1. Essential hypertension  - Continue medication, monitor blood pressure at home.  - Continue DASH diet.  Reminder to go to the ER if any CP,  SOB, nausea, dizziness, severe HA, changes vision/speech.  - CBC with Differential/Platelet - COMPLETE METABOLIC PANEL WITH GFR - Magnesium - TSH  2. Hyperlipidemia associated with type 2 diabetes mellitus ([place])  - Continue diet/meds, exercise,& lifestyle modifications.  - Continue monitor periodic cholesterol/liver & renal functions   - Lipid panel - TSH  3. Type 2 diabetes mellitus with stage 2 chronic kidney  disease, with long-term current use of insulin (HCC)  - Continue diet, exercise  -  Lifestyle modifications.  - Monitor appropriate labs.  - COMPLETE METABOLIC PANEL WITH GFR - Hemoglobin A1c  4. PAF (paroxysmal atrial fibrillation) (HCC)  - TSH - Protime-INR  5. Vitamin D deficiency  - Continue supplementation.  - VITAMIN D 25 Hydroxy   6. Renal Transplant, s/p 09/2011  - COMPLETE METABOLIC PANEL WITH GFR  7. Chronic gout due to renal impairment  - Uric acid  8. Anticoagulant long-term use  - Protime-INR  9. Thrombophilia (HCC)  - CBC with Differential/Platelet - Protime-INR  10. Medication management  - CBC with Differential/Platelet - COMPLETE METABOLIC PANEL WITH GFR - Magnesium - Lipid panel - TSH - Hemoglobin A1c - VITAMIN D 25 Hydroxy - Uric acid        Discussed  regular exercise, BP monitoring, weight control to achieve/maintain BMI less than 25 and discussed med and SE's. Recommended labs to assess and monitor clinical status with further disposition pending results of labs.  I discussed the assessment and treatment plan with the patient. The patient was provided an opportunity to ask questions and all were answered. The patient agreed with the plan and demonstrated an understanding of the instructions.  I provided over 30 minutes of exam, counseling, chart  review and  complex critical decision making.         The patient was advised to call back or seek an in-person evaluation if the symptoms worsen or if the condition fails to improve as anticipated.   Kirtland Bouchard, MD

## 2020-03-05 ENCOUNTER — Encounter: Payer: Self-pay | Admitting: Internal Medicine

## 2020-03-06 ENCOUNTER — Ambulatory Visit (INDEPENDENT_AMBULATORY_CARE_PROVIDER_SITE_OTHER): Payer: Medicare Other | Admitting: Internal Medicine

## 2020-03-06 DIAGNOSIS — Z7901 Long term (current) use of anticoagulants: Secondary | ICD-10-CM

## 2020-03-06 DIAGNOSIS — E1169 Type 2 diabetes mellitus with other specified complication: Secondary | ICD-10-CM

## 2020-03-06 DIAGNOSIS — I10 Essential (primary) hypertension: Secondary | ICD-10-CM

## 2020-03-06 DIAGNOSIS — R69 Illness, unspecified: Secondary | ICD-10-CM

## 2020-03-06 DIAGNOSIS — M1A3790 Chronic gout due to renal impairment, unspecified ankle and foot, without tophus (tophi): Secondary | ICD-10-CM

## 2020-03-06 DIAGNOSIS — D6859 Other primary thrombophilia: Secondary | ICD-10-CM

## 2020-03-06 DIAGNOSIS — E559 Vitamin D deficiency, unspecified: Secondary | ICD-10-CM

## 2020-03-06 DIAGNOSIS — Z940 Kidney transplant status: Secondary | ICD-10-CM

## 2020-03-06 DIAGNOSIS — I480 Paroxysmal atrial fibrillation: Secondary | ICD-10-CM

## 2020-03-06 DIAGNOSIS — E785 Hyperlipidemia, unspecified: Secondary | ICD-10-CM

## 2020-03-06 DIAGNOSIS — Z794 Long term (current) use of insulin: Secondary | ICD-10-CM

## 2020-03-06 DIAGNOSIS — E1122 Type 2 diabetes mellitus with diabetic chronic kidney disease: Secondary | ICD-10-CM

## 2020-03-06 DIAGNOSIS — N182 Chronic kidney disease, stage 2 (mild): Secondary | ICD-10-CM

## 2020-03-14 ENCOUNTER — Encounter: Payer: Self-pay | Admitting: Internal Medicine

## 2020-03-22 ENCOUNTER — Other Ambulatory Visit: Payer: Self-pay | Admitting: Internal Medicine

## 2020-03-22 DIAGNOSIS — N138 Other obstructive and reflux uropathy: Secondary | ICD-10-CM

## 2020-03-22 DIAGNOSIS — E785 Hyperlipidemia, unspecified: Secondary | ICD-10-CM

## 2020-03-22 DIAGNOSIS — E1169 Type 2 diabetes mellitus with other specified complication: Secondary | ICD-10-CM

## 2020-03-22 DIAGNOSIS — M1A3790 Chronic gout due to renal impairment, unspecified ankle and foot, without tophus (tophi): Secondary | ICD-10-CM

## 2020-04-05 ENCOUNTER — Other Ambulatory Visit: Payer: Self-pay | Admitting: Internal Medicine

## 2020-04-05 MED ORDER — DEXAMETHASONE 2 MG PO TABS: ORAL_TABLET | ORAL | 0 refills | Status: DC

## 2020-04-05 MED ORDER — AZITHROMYCIN 250 MG PO TABS: ORAL_TABLET | ORAL | 0 refills | Status: DC

## 2020-04-16 ENCOUNTER — Other Ambulatory Visit: Payer: Self-pay | Admitting: Adult Health

## 2020-04-16 DIAGNOSIS — M542 Cervicalgia: Secondary | ICD-10-CM

## 2020-04-18 ENCOUNTER — Encounter: Payer: Medicare Other | Admitting: Internal Medicine

## 2020-04-19 ENCOUNTER — Other Ambulatory Visit: Payer: Self-pay | Admitting: *Deleted

## 2020-04-19 MED ORDER — TRAZODONE HCL 50 MG PO TABS: ORAL_TABLET | ORAL | 0 refills | Status: DC

## 2020-04-24 ENCOUNTER — Encounter: Payer: Medicare Other | Admitting: Internal Medicine

## 2020-05-11 ENCOUNTER — Encounter: Payer: Self-pay | Admitting: Cardiovascular Disease

## 2020-05-11 NOTE — Progress Notes (Signed)
Cardiology Office Note:    Date:  05/12/2020   ID:  Robert Arnold, DOB [birth year], MRN [id number]  PCP:  [name], MD  Cardiologist:  Mertie Moores, MD  Electrophysiologist:  None   Referring MD: [name], MD   Chief Complaint  Patient presents with  . Atrial Fibrillation    Problem list 1.  Coronary artery disease-status post stenting ( September 26, 2011 and coronary artery bypass grafting -  ~ Oct. ,  2013  2.  Paroxysmal atrial fibrillation 3.  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 5.  Hyperlipidemia    Jan. 8 2019   Robert Arnold is a 75 y.o. male with a hx of AV block . We are asked to see him today by Dr. Janit Pagan for further evaluation of this variable heart block.  Robert Arnold has a history of coronary artery disease.  The day following his kidney transplant Robert Arnold had a heart attack.  Robert Arnold had a stent placed and then subsequently had coronary artery bypass grafting.  Robert Arnold developed atrial fibrillation following his bypass grafting and continues to have paroxysmal atrial fibrillation.  Robert Arnold was started on Coumadin.  Robert Arnold has had his INR levels measured at Dr. Titus Mould office.   Hx of kidney transplant 6 years ago.  Also has a history of insulin-dependent diabetes mellitus. Robert Arnold typically exercises quite a bit.  Robert Arnold is not exercised in the past several weeks because of the recent this recent finding of variable heart block. Robert Arnold was seen by Dr. Janit Pagan and the EKG suggested that Robert Arnold might have trifascicular heart block.  Denies any episodes of syncope or presyncope.  Feb. 4, 2022: Robert Arnold is seen today for follow up of his AV block , CAD Has some left rib pain , Not exertional , not associated with eating or drinking  Usually resolves with tylenol    Past Medical History:  Diagnosis Date  . Adenomatous colon polyp   . Allergy   . BPH (benign  prostatic hyperplasia)   . CHF (congestive heart failure) ([place])   . 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 Diabetes mellitus type 2, controlled ([place])   . FHx: heart disease 02/27/2018  . Glaucoma   . Gout   . 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 MI (myocardial infarction) ([place]) 09/26/2011  . NSTEMI (non-ST elevated myocardial infarction) ([place]) 11/26/2013   2013   . OSA (obstructive sleep apnea)   . PAF (paroxysmal atrial fibrillation) ([place])    s/p DC-CV in 6/13. Off coumadin due to ureteral bleed  . Testosterone deficiency 02/27/2018    Past Surgical History:  Procedure Laterality Date  . ANTERIOR CERVICAL DECOMP/DISCECTOMY FUSION N/A 05/04/2018   Procedure: Cervical Five-Six Anterior cervical decompression/discectomy/fusion;  Surgeon: Jovita Gamma, MD;  Location: [facility];  Service: Neurosurgery;  Laterality: N/A;  Cervical Five-Six Anterior cervical decompression/discectomy/fusion  . AV FISTULA PLACEMENT  2011   Left forearm  . COLONOSCOPY    . CORONARY ANGIOPLASTY WITH STENT PLACEMENT    . CORONARY ARTERY BYPASS GRAFT  2013  . KIDNEY TRANSPLANT  09/25/2011  . PERITONEAL CATHETER INSERTION  2011    Current Medications: Current Meds  Medication Sig  . allopurinol (ZYLOPRIM) 300 MG tablet Take     1 tablet     Daily  to Prevent Gout  . Ascorbic Acid (VITAMIN C) 1000 MG tablet Take 1,000 mg by mouth daily.  Marland Kitchen aspirin EC 81 MG tablet Take 81 mg by mouth daily.  Marland Kitchen atorvastatin (LIPITOR) 40 MG tablet Take     1 tablet     Daily      for Cholesterol  . Black Pepper-Turmeric (TURMERIC COMPLEX/BLACK PEPPER PO) Take 1,500 mg by mouth daily.  Marland Kitchen buPROPion (WELLBUTRIN XL) 300 MG 24 hr tablet TAKE ONE TABLET BY MOUTH EVERY MORNING FOR MOOD  . Calcium Carbonate-Vitamin D (OYSTER SHELL CALCIUM/D) 250-125 MG-UNIT TABS Take by mouth.  . finasteride (PROSCAR) 5 MG tablet Take     1 tablet      Daily       for Prostate  .  gabapentin (NEURONTIN) 300 MG capsule Take     1 capsule     3 x /day       as needed for Pain  . glucose blood (FREESTYLE TEST STRIPS) test strip Check blood sugar 3 to 4 times daily for medication regulation.  . insulin NPH-regular Human (NOVOLIN 70/30) (70-30) 100 UNIT/ML injection Take 20-30 units 2 x day or as directed  . latanoprost (XALATAN) 0.005 % ophthalmic solution Place 1 drop into both eyes at bedtime.   Marland Kitchen MAGNESIUM-OXIDE 400 (241.3 Mg) MG tablet Take 400 mg by mouth 2 (two) times daily.   . mycophenolate (MYFORTIC) 360 MG TBEC EC tablet TAKE 1 TABLET BY MOUTH TWICE DAILY  . Probiotic Product (PROBIOTIC DAILY PO) Take 1 capsule by mouth daily.  . tacrolimus (PROGRAF) 0.5 MG capsule Take 0.5 mg by mouth 2 (two) times daily.  . tacrolimus (PROGRAF) 1 MG capsule Take 1 mg by mouth 2 (two) times daily.  . traZODone (DESYREL) 50 MG tablet Take      1 tablet       1 hour before        Bedtime as needed for Sleep  . VITAMIN D PO Take 5,000 Units by mouth. Takes two 5000 unit capsules on MWF and 1 capsule 4 days a week.  . Zinc 50 MG CAPS Take 50 mg by mouth daily.     Allergies:   Losartan, Crestor [rosuvastatin], Lorazepam, and Morphine and related   Social History   Socioeconomic History  . Marital status: Married    Spouse name: Not on file  . Number of children: 1  . Years of education: Not on file  . Highest education level: Not on file  Occupational History  . Occupation: retired  Tobacco Use  . Smoking status: Former Smoker    Packs/day: 2.00    Years: 24.00    Pack years: 48.00    Types: Cigarettes    Start date: 1968    Quit date: 11/29/1990    Years since quitting: 29.4  . Smokeless tobacco: Never Used  Vaping Use  . Vaping Use: Never used  Substance and Sexual Activity  . Alcohol use: Yes    Comment: 1-2 a month  . Drug use: No  . Sexual activity: Not on file  Other Topics Concern  . Not on file  Social History Narrative  . Not on file   Social  Determinants of Health   Financial Resource Strain: Not on file  Food Insecurity: Not on file  Transportation Needs: Not on file  Physical Activity: Not on file  Stress: Not on file  Social Connections: Not on file     Family History: The patient's  family history includes COPD in his mother; Diabetes in his mother; Heart attack in his father; Kidney disease in his sister. There is no history of Colon cancer, Stomach cancer, Rectal cancer, Esophageal cancer, or Liver cancer.  ROS:   Please see the history of present illness.     All other systems reviewed and are negative.  EKGs/Labs/Other Studies Reviewed:    The following studies were reviewed today:     Recent Labs: 07/19/2019: Magnesium 1.7; TSH 2.24 11/24/2019: ALT 13; BUN 20; Creat 0.93; Potassium 4.4; Sodium 139 12/30/2019: Hemoglobin 14.6; Platelets 138  Recent Lipid Panel    Component Value Date/Time   CHOL 127 07/19/2019 0934   CHOL 158 03/15/2013 0744   TRIG 54 07/19/2019 0934   HDL 58 07/19/2019 0934   HDL 61 03/15/2013 0744   CHOLHDL 2.2 07/19/2019 0934   VLDL 17 09/09/2016 1042   LDLCALC 56 07/19/2019 0934    Physical Exam:    Physical Exam: Blood pressure 136/74, pulse 80, height 5\' 8"  (1.727 m), weight 189 lb (85.7 kg), SpO2 95 %.  GEN:  Well nourished, well developed in no acute distress HEENT: Normal NECK: No JVD; No carotid bruits LYMPHATICS: No lymphadenopathy CARDIAC: RRR , left sided rib pain  RESPIRATORY:  Clear to auscultation without rales, wheezing or rhonchi  ABDOMEN: Soft, non-tender, non-distended MUSCULOSKELETAL:  No edema; No deformity  SKIN: Warm and dry NEUROLOGIC:  Alert and oriented x 3    EKG:    May 12, 2020: Normal sinus rhythm with a markedly prolonged first-degree AV block.  Robert Arnold has some lengthening of his PR interval suggestive of Wenckebach but Robert Arnold did not have any blocked P waves today.  ASSESSMENT:    No diagnosis found. PLAN:      1. First-degree AV block: Robert Arnold has  a very long first-degree AV block.  Robert Arnold has some lengthening of his PR interval which may be due to Mobitz 2nd degree type 1 Desiree Hane)  Ive discussed this with him .  Robert Arnold has no syncope   2.  Coronary disease:   No angina   3.  Hyperlipidemia: Stable.  4.  Paroxysmal atrial fibrillation:  Remains in NSR  .  Medication Adjustments/Labs and Tests Ordered: Current medicines are reviewed at length with the patient today.  Concerns regarding medicines are outlined above.  No orders of the defined types were placed in this encounter.  No orders of the defined types were placed in this encounter.   There are no Patient Instructions on file for this visit.   Signed, Mertie Moores, MD  05/12/2020 3:57 PM    [facility]

## 2020-05-12 ENCOUNTER — Encounter: Payer: Self-pay | Admitting: Cardiovascular Disease

## 2020-05-12 ENCOUNTER — Ambulatory Visit (INDEPENDENT_AMBULATORY_CARE_PROVIDER_SITE_OTHER): Payer: Medicare Other | Admitting: Cardiovascular Disease

## 2020-05-12 ENCOUNTER — Other Ambulatory Visit: Payer: Self-pay

## 2020-05-12 VITALS — BP 136/74 | HR 80 | Ht 68.0 in | Wt 189.0 lb

## 2020-05-12 DIAGNOSIS — I441 Atrioventricular block, second degree: Secondary | ICD-10-CM | POA: Diagnosis not present

## 2020-05-12 DIAGNOSIS — I25708 Atherosclerosis of coronary artery bypass graft(s), unspecified, with other forms of angina pectoris: Secondary | ICD-10-CM

## 2020-05-12 DIAGNOSIS — I440 Atrioventricular block, first degree: Secondary | ICD-10-CM

## 2020-05-12 NOTE — Patient Instructions (Signed)

## 2020-05-15 ENCOUNTER — Other Ambulatory Visit: Payer: Self-pay | Admitting: Internal Medicine

## 2020-05-15 DIAGNOSIS — M542 Cervicalgia: Secondary | ICD-10-CM

## 2020-05-15 MED ORDER — GABAPENTIN 300 MG PO CAPS: ORAL_CAPSULE | ORAL | 0 refills | Status: DC

## 2020-05-15 NOTE — Progress Notes (Signed)
CPE AND FOLLOW UP Assessment:   Encounter for  annual wellness exam 1 year  Essential hypertension - continue medications, DASH diet, exercise and monitor at home. Call if greater than 130/80.  -     CBC with Differential/Platelet  PAF (paroxysmal atrial fibrillation) (HCC) Check INR q month and will adjust medication according to labs.  Discussed if patient falls to immediately contact office or go to ER. Discussed foods that can increase or decrease Coumadin levels. Patient understands to call the office before starting a new medication. Follow up in one month.   Chronic systolic heart failure ([place]) Improrved on recent ECHO 04/2018 Follows with cardiology  Control blood pressure, cholesterol, glucose, increase exercise.   Weights are down   Thrombocytopenia (HCC) Check CBC at routine visits  Gout due to renal impairment, unspecified chronicity, unspecified site  recheck Uric acid as needed, Diet discussed, continue medications.  Vitamin D deficiency Continue supplement   Medication management Check routine labs   Long term current use of anticoagulant therapy Monitor INR  Benign prostatic hyperplasia without lower urinary tract symptoms Continue medications  Type 2 Diabetes Mellitus Education: Reviewed 'ABCs' of diabetes management (respective goals in parentheses):  A1C (<7), blood pressure (<130/80), and cholesterol (LDL <70) Eye Exam yearly and Dental Exam every 6 months - UTD  Dietary recommendations Physical Activity recommendations - Foot exam completed   CKD stage 2 due to type 2 diabetes mellitus ([place]) Discussed general issues about diabetes pathophysiology and management., Educational material distributed., Suggested low cholesterol diet., Encouraged aerobic exercise., Discussed foot care., Reminded to get yearly retinal exam.  Mild episode of recurrent major depressive disorder ([place]) Continue medications  Lifestyle discussed: diet/exerise, sleep  hygiene, stress management, hydration   Overweight  - long discussion about weight loss, diet, and exercise -recommended diet heavy in fruits and veggies and low in animal meats, cheeses, and dairy products  Chronic bilateral hip pain  Mild, managing with lifestyle modification and tylenol; NSAIDs contraindicated Declines imaging or ortho referral at this time  Hx of renal transplant; AV fistula ([place]) S/p transplant in 2013, doing well since on on myfortic and prograf (follows with [facility]), Dr. Donnetta Hutching PRN for retained AV fistula   Senile purpura ([place]) R/t comadin, monitor, protect skin   History of skin cancer Unsure what type, non melanoma; derm following   Actinic keratoses 3 freeze and thaw technique after verbal permission Tolerated well   Orders Placed This Encounter  Procedures  . CBC with Differential/Platelet  . COMPLETE METABOLIC PANEL WITH GFR  . Magnesium  . Lipid panel  . TSH  . Hemoglobin A1c  . Protime-INR  . Uric acid  . Microalbumin / creatinine urine ratio  . Urinalysis, Routine w reflex microscopic  . Vitamin B12  . HM DIABETES FOOT EXAM    Over 30 minutes of exam, counseling, chart review, and critical decision making was performed  Future Appointments  Date Time Provider [place]  06/21/2020  8:45 AM Liane Comber, NP [facility] None  05/16/2021  2:00 PM Liane Comber, NP [facility] None     Plan:   During the course of the visit the patient was educated and counseled about appropriate screening and preventive services including:    Pneumococcal vaccine   Influenza vaccine  Prevnar 13  Td vaccine  Screening electrocardiogram  Colorectal cancer screening  Diabetes screening  Glaucoma screening  Nutrition counseling    Subjective:  Robert Arnold is a 75 y.o. male who presents for  CPE. He has Chronic systolic heart failure ([place]); CKD stage 2 due to type 2 diabetes mellitus ([place]); Hyperlipidemia associated  with type 2 diabetes mellitus ([place]); BPH with obstruction/lower urinary tract symptoms; Gout; PAF (paroxysmal atrial fibrillation) ([place]); Essential hypertension; Vitamin D deficiency; Anticoagulant long-term use; Renal Transplant, s/p 09/2011; Depression, major, in remission ([place]); Thrombocytopenia ([place]); CAD (coronary artery disease) of artery bypass graft; IgA nephropathy; Essential tremor; Overweight (BMI 25.0-29.9); Senile purpura ([place]); Type 2 diabetes mellitus with stage 2 chronic kidney disease, with long-term current use of insulin ([place]); Former smoker (quit 1992); HNP (herniated nucleus pulposus), cervical; S/P cervical spinal fusion; Chronic hip pain, bilateral; Hypercoagulopathy ([place]); Mobitz I; A-V fistula ([place]); Trochanteric bursitis of left hip; and First degree AV block on their problem list.   He is married, 1 adopted son, 1 grandson 2 great grandchildren in [place].  He is retired from [employer].   He fell going into cardiology office on 05/12/2020, has R wrist fracture, ? Ulna, was evaluated by [facility], now in brace, has follow up scheduled next week.  He underwent C6-7 disc decompression and fusion in 04/2018 by Dr. Durene Cal and feels fully resolved without any issues.   He has chronic bilateral hip pain but improves with steroid injection, recently improved.   He has history of depression, well controlled with wellbutrin 300 mg daily. He also takes trazodone 50 mg at night which works well for sleep.   BMI is Body mass index is 28.76 kg/m., he has been working on diet, admits exercise has been limited with weather. Does have a recumbent bike, would like to work up to 30 min 5 days a week. Walks in park when weather is good.  He drinks 1-2 cups of coffee. Minimal alochol. Drinks lots of water. Stress levels are low.  Wt Readings from Last 3 Encounters:  05/16/20 186 lb 6.4 oz (84.6 kg)  05/12/20 189 lb (85.7 kg)  01/31/20 185 lb (83.9 kg)   He has p. A. Fib, on coumadin; hx of  CAD s/p stenting 09/2011 and CABG 02/2012, has done well since. 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  He has hx of systolic CHF, improved/resolved on most recent ECHO 04/2018 however with diastolic dysfunction and LVEF 50-55%.  Follows with Dr. Acie Fredrickson  He has history of Afib and is on coumadin, he is on 7.5 mg on Wed and 5 mg other days, within goal range; no nose bleeds, blood stool/urine. No missed doses, no ABX.  Lab Results  Component Value Date   INR 1.9 (H) 01/31/2020   INR 3.8 (H) 12/30/2019   INR 3.0 (H) 11/24/2019   His blood pressure has been controlled at home (recently running 130/75), today their BP is BP: 140/72 He does not workout, works around the house. He denies chest pain, shortness of breath, dizziness.    He is on cholesterol medication (atorvastatin 40 mg daily) and denies myalgias. His cholesterol is at goal. The cholesterol last visit was:   Lab Results  Component Value Date   CHOL 127 07/19/2019   HDL 58 07/19/2019   LDLCALC 56 07/19/2019   TRIG 54 07/19/2019   CHOLHDL 2.2 07/19/2019    He has been working on diet and exercise for well controlled T2 diabetes on novolin 70/30 22 units AM, 12-15 units units at night, and denies increased appetite, nausea, paresthesia of the feet, polydipsia, polyuria and visual disturbances.  Reports fasting around 110-120.  Does have occasional low glucose, always in the afternoon, none in several  months.  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  On prograf and myfortic, follows [facility]  Now back in CKD 2 stage;  Lab Results  Component Value Date   GFRNONAA 82 11/24/2019   Patient is on Vitamin D supplement, alternates 10000 IU and 5000 IU every other day.  Lab Results  Component Value Date   VD25OH 80 07/19/2019     Patient is on allopurinol for  gout and does not report a recent flare.  Lab Results  Component Value Date   LABURIC 3.8 (L) 07/19/2019   He notes stream is slow to start; denies nocturia, straining, dribbling.  Lab Results  Component Value Date   PSA <0.1 04/05/2019   PSA <0.1 03/03/2018   PSA <0.1 01/31/2017      Medication Review: Current Outpatient Medications on File Prior to Visit  Medication Sig Dispense Refill  . allopurinol (ZYLOPRIM) 300 MG tablet Take     1 tablet     Daily       to Prevent Gout 90 tablet 0  . Ascorbic Acid (VITAMIN C) 1000 MG tablet Take 1,000 mg by mouth daily.    Marland Kitchen aspirin EC 81 MG tablet Take 81 mg by mouth daily.    Marland Kitchen atorvastatin (LIPITOR) 40 MG tablet Take     1 tablet     Daily      for Cholesterol 90 tablet 0  . Black Pepper-Turmeric (TURMERIC COMPLEX/BLACK PEPPER PO) Take 1,500 mg by mouth daily.    Marland Kitchen buPROPion (WELLBUTRIN XL) 300 MG 24 hr tablet TAKE ONE TABLET BY MOUTH EVERY MORNING FOR MOOD 90 tablet 2  . Calcium Carbonate-Vitamin D (OYSTER SHELL CALCIUM/D) 250-125 MG-UNIT TABS Take by mouth.    . finasteride (PROSCAR) 5 MG tablet Take     1 tablet      Daily       for Prostate 90 tablet 0  . gabapentin (NEURONTIN) 300 MG capsule Take  1 capsule  3 x /day  as needed for Pain 90 capsule 0  . glucose blood (FREESTYLE TEST STRIPS) test strip Check blood sugar 3 to 4 times daily for medication regulation. 450 each PRN  . insulin NPH-regular Human (NOVOLIN 70/30) (70-30) 100 UNIT/ML injection Take 20-30 units 2 x day or as directed 10 mL 99  . latanoprost (XALATAN) 0.005 % ophthalmic solution Place 1 drop into both eyes at bedtime.   4  . MAGNESIUM-OXIDE 400 (241.3 Mg) MG tablet Take 400 mg by mouth 2 (two) times daily.   6  . mycophenolate (MYFORTIC) 360 MG TBEC EC tablet TAKE 1 TABLET BY MOUTH TWICE DAILY 180 tablet 1  . Probiotic Product (PROBIOTIC DAILY PO) Take 1 capsule by mouth daily.    . tacrolimus (PROGRAF) 0.5 MG capsule Take 0.5 mg by mouth 2 (two) times daily.    .  tacrolimus (PROGRAF) 1 MG capsule Take 1 mg by mouth 2 (two) times daily.    . traZODone (DESYREL) 50 MG tablet Take      1 tablet       1 hour before        Bedtime as needed for Sleep 90 tablet 0  . VITAMIN D PO Take 5,000 Units by mouth. Takes two 5000 unit capsules on MWF and 1 capsule 4  days a week.    . Zinc 50 MG CAPS Take 50 mg by mouth daily.     No current facility-administered medications on file prior to visit.    Allergies: Allergies  Allergen Reactions  . Losartan Anaphylaxis  . Crestor [Rosuvastatin]     Elevated LFT's  . Lorazepam     Pt is unsure of reaction   . Morphine And Related Other (See Comments)    Has no effect on pt     Current Problems (verified) has Chronic systolic heart failure ([place]); CKD stage 2 due to type 2 diabetes mellitus ([place]); Hyperlipidemia associated with type 2 diabetes mellitus ([place]); BPH with obstruction/lower urinary tract symptoms; Gout; PAF (paroxysmal atrial fibrillation) ([place]); Essential hypertension; Vitamin D deficiency; Anticoagulant long-term use; Renal Transplant, s/p 09/2011; Depression, major, in remission ([place]); Thrombocytopenia ([place]); CAD (coronary artery disease) of artery bypass graft; IgA nephropathy; Essential tremor; Overweight (BMI 25.0-29.9); Senile purpura ([place]); Type 2 diabetes mellitus with stage 2 chronic kidney disease, with long-term current use of insulin ([place]); Former smoker (quit 1992); HNP (herniated nucleus pulposus), cervical; S/P cervical spinal fusion; Chronic hip pain, bilateral; Hypercoagulopathy ([place]); Mobitz I; A-V fistula ([place]); Trochanteric bursitis of left hip; and First degree AV block on their problem list.  Screening Tests Immunization History  Administered Date(s) Administered  . DT (Pediatric) 06/05/2015  . Hepatitis B 04/08/2009  . Influenza, High Dose Seasonal PF 12/27/2013, 01/17/2015, 01/11/2016, 01/02/2018, 12/21/2018, 01/31/2020  . Influenza-Unspecified 12/25/2016  . PFIZER(Purple  Top)SARS-COV-2 Vaccination 07/24/2019, 08/17/2019  . Pneumococcal Conjugate-13 10/25/2013  . Pneumococcal Polysaccharide-23 06/05/2015  . Pneumococcal-Unspecified 04/08/2008  . Tdap 04/08/2008  . Zoster Recombinat (Shingrix) 02/12/2018, 06/02/2018    Preventative care: Last colonoscopy: 03/2016, 5 year follow up  CT AB 2012 CT chest 2010 Sleep study 2013 Echo 02/2013, 09/6597, normal systolic, diastolic dysfunction, LVEF 50-55% Stress test 01/2012  CXR 10/2016  Prior vaccinations: TD or Tdap: 2017  Influenza: 2021 Pneumococcal: 2017 Prevnar13: 2015 Shingles/Zostavax: had 2/2, shingrix  Covid 19: had 3/3, 2021, pfizer  Names of Other Physician/Practitioners you currently use: 1. [facility] here for primary care 2. Dr. Nicki Reaper, [facility], last visit 12/16/2019, DM no retinopathy, follows q60m due to glaucoma 3. Dr. Mirna Mires, dentist, last visit, 2021 q 6 months 4. [facility] dermatology, last 2021, has upcoming appointment in 1 month   Patient Care Team: [name], MD as PCP - General (Internal Medicine) Nahser, Wonda Cheng, MD as PCP - Cardiology (Cardiology) Inda Castle, MD (Inactive) as Consulting Physician (Gastroenterology) Macarthur Critchley, [place] as Referring Physician (Optometry) Madelon Lips, MD as Consulting Physician (Nephrology)  Surgical: He  has a past surgical history that includes Colonoscopy; Peritoneal catheter insertion (2011); AV fistula placement (2011); Kidney transplant (09/25/2011); Coronary angioplasty with stent; Coronary artery bypass graft (2013); and Anterior cervical decomp/discectomy fusion (N/A, 05/04/2018). Family His family history includes Alcohol abuse in his brother and maternal grandfather; Alcoholism in his brother; Alzheimer's disease in his brother; COPD in his brother and mother; Diabetes in his mother; Heart attack in his father; Kidney disease in his sister. Social history  He reports  that he quit smoking about 29 years ago. His smoking use included cigarettes. He started smoking about 54 years ago. He has a 48.00 pack-year smoking history. He has never used smokeless tobacco. He reports current alcohol use. He reports that he does not use drugs.  Depression/mood screen:   Depression screen [facility] 2/9 05/16/2020  Decreased Interest 0  Down, Depressed,  Hopeless 0  PHQ - 2 Score 0  Altered sleeping 0  Tired, decreased energy 0  Change in appetite 0  Feeling bad or failure about yourself  0  Trouble concentrating 0  Moving slowly or fidgety/restless 0  Suicidal thoughts 0  PHQ-9 Score 0  Difficult doing work/chores Not difficult at all  Some recent data might be hidden     Review of Systems  Constitutional: Negative for malaise/fatigue and weight loss.  HENT: Negative for hearing loss and tinnitus.   Eyes: Negative for blurred vision and double vision.  Respiratory: Negative for cough, sputum production, shortness of breath and wheezing.   Cardiovascular: Negative for chest pain, palpitations, orthopnea, claudication, leg swelling and PND.  Gastrointestinal: Negative for abdominal pain, blood in stool, constipation, diarrhea, heartburn, melena, nausea and vomiting.  Genitourinary: Negative.   Musculoskeletal: Negative for falls, joint pain and myalgias.  Skin: Negative for rash.  Neurological: Negative for dizziness, tingling, sensory change, weakness and headaches.  Endo/Heme/Allergies: Negative for polydipsia.  Psychiatric/Behavioral: Negative.  Negative for depression, memory loss, substance abuse and suicidal ideas. The patient is not nervous/anxious and does not have insomnia.   All other systems reviewed and are negative.     Objective:   Today's Vitals   05/16/20 1359  BP: 140/72  Pulse: (!) 45  Temp: (!) 95.9 F (35.5 C)  SpO2: 97%  Weight: 186 lb 6.4 oz (84.6 kg)  Height: 5' 7.5" (1.715 m)   Body mass index is 28.76 kg/m.  General Appearance:  Well nourished, in no apparent distress. Eyes: PERRLA, EOMs, conjunctiva no swelling or erythema Sinuses: No Frontal/maxillary tenderness ENT/Mouth: Ext aud canals clear, TMs without erythema, bulging. No erythema, swelling, or exudate on post pharynx.  Tonsils not swollen or erythematous. Hearing normal.  Neck: Supple, thyroid normal.  Respiratory: Respiratory effort normal, BS equal bilaterally without rales, rhonchi, wheezing or stridor.  Cardio: Irregularly irregular, 3/6 early systolic blowing murmur. Brisk LE pulses, without edema. Has AV fistula to left forearm with thrill. Sensation intact, cap refill brisk Abdomen: Soft, + BS.  Non tender, no guarding, rebound, hernias, masses. Lymphatics: Non tender without lymphadenopathy.  Musculoskeletal: Full ROM, 5/5 strength, normal gait. Excepting right wrist in splint; distal sensation intact.  Skin: Warm, dry without rashes; small ecchymoses to bilateral upper extremities; he has scaly areas with erythematous base to forehead, left cheek x 2.  Neuro: Cranial nerves intact. Normal muscle tone, no cerebellar symptoms.  Psych: Awake and oriented X 3, normal affect, Insight and Judgment appropriate.  GU: doing regular self exams, denies concerns, declines  EKG: defer - just had by cardiology 05/12/2020   The patient's weight, height, BMI have been recorded in the chart.  I have made referrals, counseling, and provided education to the patient based on review of the above and I have provided the patient with a written personalized care plan for preventive services.     Izora Ribas, NP   05/16/2020

## 2020-05-16 ENCOUNTER — Other Ambulatory Visit: Payer: Self-pay

## 2020-05-16 ENCOUNTER — Encounter: Payer: Self-pay | Admitting: Adult Health

## 2020-05-16 ENCOUNTER — Ambulatory Visit (INDEPENDENT_AMBULATORY_CARE_PROVIDER_SITE_OTHER): Payer: Medicare Other | Admitting: Adult Health

## 2020-05-16 VITALS — BP 140/72 | HR 45 | Temp 95.9°F | Ht 67.5 in | Wt 186.4 lb

## 2020-05-16 DIAGNOSIS — D696 Thrombocytopenia, unspecified: Secondary | ICD-10-CM

## 2020-05-16 DIAGNOSIS — I5022 Chronic systolic (congestive) heart failure: Secondary | ICD-10-CM | POA: Diagnosis not present

## 2020-05-16 DIAGNOSIS — D692 Other nonthrombocytopenic purpura: Secondary | ICD-10-CM | POA: Diagnosis not present

## 2020-05-16 DIAGNOSIS — E1122 Type 2 diabetes mellitus with diabetic chronic kidney disease: Secondary | ICD-10-CM

## 2020-05-16 DIAGNOSIS — I480 Paroxysmal atrial fibrillation: Secondary | ICD-10-CM

## 2020-05-16 DIAGNOSIS — E663 Overweight: Secondary | ICD-10-CM

## 2020-05-16 DIAGNOSIS — I770 Arteriovenous fistula, acquired: Secondary | ICD-10-CM

## 2020-05-16 DIAGNOSIS — E785 Hyperlipidemia, unspecified: Secondary | ICD-10-CM

## 2020-05-16 DIAGNOSIS — D6859 Other primary thrombophilia: Secondary | ICD-10-CM

## 2020-05-16 DIAGNOSIS — Z794 Long term (current) use of insulin: Secondary | ICD-10-CM

## 2020-05-16 DIAGNOSIS — F325 Major depressive disorder, single episode, in full remission: Secondary | ICD-10-CM

## 2020-05-16 DIAGNOSIS — L570 Actinic keratosis: Secondary | ICD-10-CM | POA: Diagnosis not present

## 2020-05-16 DIAGNOSIS — I10 Essential (primary) hypertension: Secondary | ICD-10-CM

## 2020-05-16 DIAGNOSIS — Z940 Kidney transplant status: Secondary | ICD-10-CM

## 2020-05-16 DIAGNOSIS — Z7901 Long term (current) use of anticoagulants: Secondary | ICD-10-CM

## 2020-05-16 DIAGNOSIS — M1A3790 Chronic gout due to renal impairment, unspecified ankle and foot, without tophus (tophi): Secondary | ICD-10-CM

## 2020-05-16 DIAGNOSIS — I441 Atrioventricular block, second degree: Secondary | ICD-10-CM

## 2020-05-16 DIAGNOSIS — N028 Recurrent and persistent hematuria with other morphologic changes: Secondary | ICD-10-CM

## 2020-05-16 DIAGNOSIS — I440 Atrioventricular block, first degree: Secondary | ICD-10-CM

## 2020-05-16 DIAGNOSIS — Z0000 Encounter for general adult medical examination without abnormal findings: Secondary | ICD-10-CM

## 2020-05-16 DIAGNOSIS — Z85828 Personal history of other malignant neoplasm of skin: Secondary | ICD-10-CM

## 2020-05-16 DIAGNOSIS — E538 Deficiency of other specified B group vitamins: Secondary | ICD-10-CM

## 2020-05-16 DIAGNOSIS — E1169 Type 2 diabetes mellitus with other specified complication: Secondary | ICD-10-CM

## 2020-05-16 DIAGNOSIS — Z87891 Personal history of nicotine dependence: Secondary | ICD-10-CM

## 2020-05-16 DIAGNOSIS — N401 Enlarged prostate with lower urinary tract symptoms: Secondary | ICD-10-CM

## 2020-05-16 DIAGNOSIS — E559 Vitamin D deficiency, unspecified: Secondary | ICD-10-CM

## 2020-05-16 DIAGNOSIS — N138 Other obstructive and reflux uropathy: Secondary | ICD-10-CM

## 2020-05-16 DIAGNOSIS — G250 Essential tremor: Secondary | ICD-10-CM

## 2020-05-16 DIAGNOSIS — N182 Chronic kidney disease, stage 2 (mild): Secondary | ICD-10-CM

## 2020-05-16 NOTE — Patient Instructions (Signed)
Mr. Mccollum , Thank you for taking time to come for your Annual Wellness Visit. I appreciate your ongoing commitment to your health goals. Please review the following plan we discussed and let me know if I can assist you in the future.   This is a list of the screening recommended for you and due dates:  Health Maintenance  Topic Date Due  . COVID-19 Vaccine (3 - Pfizer risk 4-dose series) 09/14/2019  . Urine Protein Check  04/04/2020  . Hemoglobin A1C  07/31/2020  . Eye exam for diabetics  12/15/2020  . Colon Cancer Screening  03/13/2021  . Complete foot exam   05/16/2021  . Tetanus Vaccine  06/04/2025  . Flu Shot  Completed  .  Hepatitis C: One time screening is recommended by Center for Disease Control  (CDC) for  adults born from [birth year] through 1963.   Completed  . Pneumonia vaccines  Completed      Know what a healthy weight is for you (roughly BMI <25) and aim to maintain this  Aim for 7+ servings of fruits and vegetables daily  65-80+ fluid ounces of water or unsweet tea for healthy kidneys  Limit to max 1 drink of alcohol per day; avoid smoking/tobacco  Limit animal fats in diet for cholesterol and heart health - choose grass fed whenever available  Avoid highly processed foods, and foods high in saturated/trans fats  Aim for low stress - take time to unwind and care for your mental health  Aim for 150 min of moderate intensity exercise weekly for heart health, and weights twice weekly for bone health  Aim for 7-9 hours of sleep daily       Actinic Keratosis  What is an actinic keratosis? An actinic keratosis (plural: actinic keratoses) is growth on the surface of the skin that usually appears as a red, hard, crusty or scaly bump.  What causes actinic keratoses? Repeated prolonged sun exposure causes skin damage, especially in fair-skinned persons. Sun-damaged skin becomes dry and wrinkled and may form rough, scaly spots called actinic keratoses. These rough  spots remain on the skin even though the crust or scale on top is picked off.  Why treat actinic keratoses? Actinic keratoses are not skin cancers, but because they may sometimes turn cancerous they are called "pre-cancerous". Not all will turn to skin cancer, and it usually takes several years for this to happen. Because it is much easier to treat an actinic keratosis then it is to remove a skin cancer, actinic keratoses should be treated to prevent future skin cancer.  How are actinic keratoses treated? The most common way of treating actinic keratoses is to freeze them with liquid nitrogen. Freezing causes scabbing and shedding of the sun-damaged skin. Healing after a removal usually takes two weeks, depending on the size and location of the keratosis. Hands and legs heal more slowly than the face. The skin's final appearance is usually excellent. There are several topical medications that can be used to treat actinic keratoses. These medications generally have side effects of redness, crusting, and pain.Some are used for a few days, and some for several months before the actinic keratosis is completely gone. Photodynamic therapy is another alternative to freezing actinic keratoses.This treatment is done in a physician's office.A medication is applied to the area of skin with actinic keratoses, and it is allowed to soak in for one or more hours. A special light is then applied to the skin.Side effects include redness, burning, and peeling.  How can you prevent actinic keratoses? Protection from the sun is the best way to prevent actinic keratoses.The use of proper clothing and sunscreens can prevent the sun damage that leads to an actinic keratosis. Unfortunately, some sun damage is permanent. Once sun damage has progressed to the point where actinic keratoses develop, new keratoses may appear even without further sun exposure. However, even in skin that is already heavily sun damaged, good sun  protection can help reduce the number of actinic keratoses that will appear.

## 2020-05-17 ENCOUNTER — Encounter: Payer: Self-pay | Admitting: Adult Health

## 2020-05-17 DIAGNOSIS — R7309 Other abnormal glucose: Secondary | ICD-10-CM | POA: Insufficient documentation

## 2020-05-17 DIAGNOSIS — E538 Deficiency of other specified B group vitamins: Secondary | ICD-10-CM | POA: Insufficient documentation

## 2020-05-17 LAB — COMPLETE METABOLIC PANEL WITH GFR
AG Ratio: 1.5 (calc) (ref 1.0–2.5)
ALT: 26 U/L (ref 9–46)
AST: 29 U/L (ref 10–35)
Albumin: 3.9 g/dL (ref 3.6–5.1)
Alkaline phosphatase (APISO): 116 U/L (ref 35–144)
BUN/Creatinine Ratio: 23 (calc) — ABNORMAL HIGH (ref 6–22)
BUN: 26 mg/dL — ABNORMAL HIGH (ref 7–25)
CO2: 24 mmol/L (ref 20–32)
Calcium: 9.8 mg/dL (ref 8.6–10.3)
Chloride: 104 mmol/L (ref 98–110)
Creat: 1.12 mg/dL (ref 0.70–1.18)
GFR, Est African American: 76 mL/min/{1.73_m2} (ref >=60)
GFR, Est Non African American: 65 mL/min/{1.73_m2} (ref >=60)
Globulin: 2.6 g/dL (calc) (ref 1.9–3.7)
Glucose, Bld: 115 mg/dL — ABNORMAL HIGH (ref 65–99)
Potassium: 4.7 mmol/L (ref 3.5–5.3)
Sodium: 141 mmol/L (ref 135–146)
Total Bilirubin: 1.2 mg/dL (ref 0.2–1.2)
Total Protein: 6.5 g/dL (ref 6.1–8.1)

## 2020-05-17 LAB — URIC ACID: Uric Acid, Serum: 3.3 mg/dL — ABNORMAL LOW (ref 4.0–8.0)

## 2020-05-17 LAB — CBC WITH DIFFERENTIAL/PLATELET
Absolute Monocytes: 439 cells/uL (ref 200–950)
Basophils Absolute: 61 cells/uL (ref 0–200)
Basophils Relative: 1 %
Eosinophils Absolute: 61 cells/uL (ref 15–500)
Eosinophils Relative: 1 %
HCT: 44.2 % (ref 38.5–50.0)
Hemoglobin: 15.3 g/dL (ref 13.2–17.1)
Lymphs Abs: 1183 cells/uL (ref 850–3900)
MCH: 31.7 pg (ref 27.0–33.0)
MCHC: 34.6 g/dL (ref 32.0–36.0)
MCV: 91.5 fL (ref 80.0–100.0)
MPV: 10 fL (ref 7.5–12.5)
Monocytes Relative: 7.2 %
Neutro Abs: 4355 cells/uL (ref 1500–7800)
Neutrophils Relative %: 71.4 %
Platelets: 138 10*3/uL — ABNORMAL LOW (ref 140–400)
RBC: 4.83 10*6/uL (ref 4.20–5.80)
RDW: 12.4 % (ref 11.0–15.0)
Total Lymphocyte: 19.4 %
WBC: 6.1 10*3/uL (ref 3.8–10.8)

## 2020-05-17 LAB — HEMOGLOBIN A1C
Hgb A1c MFr Bld: 6.1 % of total Hgb — ABNORMAL HIGH (ref <5.7)
Mean Plasma Glucose: 128 mg/dL
eAG (mmol/L): 7.1 mmol/L

## 2020-05-17 LAB — LIPID PANEL
Cholesterol: 147 mg/dL (ref <200)
HDL: 64 mg/dL (ref >=40)
LDL Cholesterol (Calc): 66 mg/dL (calc)
Non-HDL Cholesterol (Calc): 83 mg/dL (calc) (ref <130)
Total CHOL/HDL Ratio: 2.3 (calc) (ref <5.0)
Triglycerides: 87 mg/dL (ref <150)

## 2020-05-17 LAB — MAGNESIUM: Magnesium: 1.9 mg/dL (ref 1.5–2.5)

## 2020-05-17 LAB — VITAMIN B12: Vitamin B-12: 389 pg/mL (ref 200–1100)

## 2020-05-17 LAB — PROTIME-INR
INR: 2.9 — ABNORMAL HIGH
Prothrombin Time: 27.2 s — ABNORMAL HIGH (ref 9.0–11.5)

## 2020-05-17 LAB — TSH: TSH: 1.59 mIU/L (ref 0.40–4.50)

## 2020-05-18 LAB — URINALYSIS, ROUTINE W REFLEX MICROSCOPIC
Bilirubin Urine: NEGATIVE
Glucose, UA: NEGATIVE
Hgb urine dipstick: NEGATIVE
Ketones, ur: NEGATIVE
Leukocytes,Ua: NEGATIVE
Nitrite: NEGATIVE
Protein, ur: NEGATIVE
Specific Gravity, Urine: 1.02 (ref 1.001–1.03)
pH: 6.5 (ref 5.0–8.0)

## 2020-05-18 LAB — MICROALBUMIN / CREATININE URINE RATIO
Creatinine, Urine: 108 mg/dL (ref 20–320)
Microalb Creat Ratio: 2 mcg/mg creat (ref <30)
Microalb, Ur: 0.2 mg/dL

## 2020-05-22 ENCOUNTER — Ambulatory Visit: Payer: Medicare Other | Admitting: Adult Health Nurse Practitioner

## 2020-06-15 ENCOUNTER — Other Ambulatory Visit: Payer: Self-pay | Admitting: *Deleted

## 2020-06-15 DIAGNOSIS — E785 Hyperlipidemia, unspecified: Secondary | ICD-10-CM

## 2020-06-15 DIAGNOSIS — E1169 Type 2 diabetes mellitus with other specified complication: Secondary | ICD-10-CM

## 2020-06-15 MED ORDER — ATORVASTATIN CALCIUM 40 MG PO TABS: ORAL_TABLET | ORAL | 0 refills | Status: DC

## 2020-06-18 ENCOUNTER — Other Ambulatory Visit: Payer: Self-pay | Admitting: Internal Medicine

## 2020-06-18 DIAGNOSIS — M542 Cervicalgia: Secondary | ICD-10-CM

## 2020-06-19 NOTE — Progress Notes (Signed)
1 month follow up for Coumadin Assessment:    PAF (paroxysmal atrial fibrillation) (HCC) Check INR q month and will adjust medication according to labs.  Discussed if patient falls to immediately contact office or go to ER. Discussed foods that can increase or decrease Coumadin levels. Patient understands to call the office before starting a new medication. Follow up in one month.   Essential hypertension - continue medications, DASH diet, exercise and monitor at home. Call if greater than 130/80.   Thrombocytopenia ([place]) Check CBC at routine visits  Long term current use of anticoagulant therapy Monitor INR   Overweight  - long discussion about weight loss, diet, and exercise -recommended diet heavy in fruits and veggies and low in animal meats, cheeses, and dairy products  Senile purpura (HCC) R/t comadin, monitor, protect skin   R hip bursitis Right Hip Bursitis-  Injection- area cleaned with alcohol, Dexamethasone 10mg  and 1 CC 0.25% bupivicaine injected into greater trochanteric, tolerated welll with immediate relief.  RICE, and exercises given If not better with refer to orthopedics.    Orders Placed This Encounter  Procedures  . CBC with Differential/Platelet  . Protime-INR    Over 15 minutes of exam, counseling, chart review, and critical decision making was performed  Future Appointments  Date Time Provider [place]  07/26/2020 11:30 AM Liane Comber, NP [facility] None  08/29/2020  2:30 PM Liane Comber, NP [facility] None  10/04/2020 11:30 AM [name], MD [facility] None  05/16/2021  2:00 PM Liane Comber, NP [facility] None    Subjective:  Robert Arnold is a 75 y.o. male who presents for 1 month coumadin follow up for a. fib, also htn, CHF, hx of CKD s/p transplant.   He reports hx of R hip bursitis, has gotten steroid injection remotely, reports increased lateral hip tenderness and with climbing stairs for several weeks.   BMI  is Body mass index is 28.86 kg/m., he has been working on diet, admits exercise has been limited with weather. Does have a recumbent bike, would like to work up to 30 min 5 days a week. Walks in park when weather is good.  He drinks 1-2 cups of coffee. Minimal alochol. Drinks lots of water. Stress levels are low.  Wt Readings from Last 3 Encounters:  06/21/20 187 lb (84.8 kg)  05/16/20 186 lb 6.4 oz (84.6 kg)  05/12/20 189 lb (85.7 kg)   He has p. A. Fib, on coumadin; hx of CAD s/p stenting 09/2011 and CABG 02/2012, has done well since. 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  He has hx of systolic CHF, improved/resolved on most recent ECHO 04/2018 however with diastolic dysfunction and LVEF 50-55%.  Follows with Dr. Acie Fredrickson.  He has history of Afib and is on coumadin, he is on 7.5 mg on Wed and 5 mg other days, within goal range; no nose bleeds, blood stool/urine. No missed doses, no ABX.  Lab Results  Component Value Date   INR 2.9 (H) 05/16/2020   INR 1.9 (H) 01/31/2020   INR 3.8 (H) 12/30/2019   His blood pressure has been controlled at home (recently running 130/75), today their BP is BP: (!) 142/76 He does not workout, works around the house. He denies chest pain, shortness of breath, dizziness.    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  On prograf and myfortic, follows [facility]  Now back in CKD 2 stage;  Lab Results  Component Value Date   GFRNONAA 65 05/16/2020   Last A1C:  Lab Results  Component Value Date   HGBA1C 6.1 (H) 05/16/2020      Medication Review: Current Outpatient Medications on File Prior to Visit  Medication Sig Dispense Refill  . allopurinol (ZYLOPRIM) 300 MG tablet Take     1 tablet     Daily       to Prevent Gout 90 tablet 0  . Ascorbic Acid (VITAMIN C) 1000 MG tablet Take 1,000 mg by mouth daily.    Marland Kitchen aspirin EC 81 MG tablet Take 81 mg by mouth daily.    Marland Kitchen atorvastatin  (LIPITOR) 40 MG tablet Take     1 tablet     Daily      for Cholesterol 90 tablet 0  . Black Pepper-Turmeric (TURMERIC COMPLEX/BLACK PEPPER PO) Take 1,500 mg by mouth daily.    Marland Kitchen buPROPion (WELLBUTRIN XL) 300 MG 24 hr tablet TAKE ONE TABLET BY MOUTH EVERY MORNING FOR MOOD 90 tablet 2  . Calcium Carbonate-Vitamin D (OYSTER SHELL CALCIUM/D) 250-125 MG-UNIT TABS Take by mouth.    . finasteride (PROSCAR) 5 MG tablet Take     1 tablet      Daily       for Prostate 90 tablet 0  . gabapentin (NEURONTIN) 300 MG capsule TAKE ONE CAPSULE BY MOUTH THREE TIMES A DAY AS NEEDED FOR PAIN 270 capsule 1  . glucose blood (FREESTYLE TEST STRIPS) test strip Check blood sugar 3 to 4 times daily for medication regulation. 450 each PRN  . insulin NPH-regular Human (NOVOLIN 70/30) (70-30) 100 UNIT/ML injection Take 20-30 units 2 x day or as directed 10 mL 99  . latanoprost (XALATAN) 0.005 % ophthalmic solution Place 1 drop into both eyes at bedtime.   4  . MAGNESIUM-OXIDE 400 (241.3 Mg) MG tablet Take 400 mg by mouth 2 (two) times daily.   6  . mycophenolate (MYFORTIC) 360 MG TBEC EC tablet TAKE 1 TABLET BY MOUTH TWICE DAILY 180 tablet 1  . Probiotic Product (PROBIOTIC DAILY PO) Take 1 capsule by mouth daily.    . tacrolimus (PROGRAF) 0.5 MG capsule Take 0.5 mg by mouth 2 (two) times daily.    . tacrolimus (PROGRAF) 1 MG capsule Take 1 mg by mouth 2 (two) times daily.    . traZODone (DESYREL) 50 MG tablet Take      1 tablet       1 hour before        Bedtime as needed for Sleep 90 tablet 0  . VITAMIN D PO Take 5,000 Units by mouth. Takes two 5000 unit capsules on MWF and 1 capsule 4 days a week.    . Zinc 50 MG CAPS Take 50 mg by mouth daily.     No current facility-administered medications on file prior to visit.    Allergies: Allergies  Allergen Reactions  . Losartan Anaphylaxis  . Crestor [Rosuvastatin]     Elevated LFT's  . Lorazepam     Pt is unsure of reaction   . Morphine And Related Other (See Comments)     Has no effect on pt     Current Problems (verified) has Chronic systolic heart failure ([place]); CKD stage 2 due to type 2 diabetes mellitus ([place]); Hyperlipidemia associated with type 2 diabetes mellitus ([place]); BPH with obstruction/lower urinary tract symptoms; Gout; PAF (paroxysmal atrial fibrillation) ([place]); Essential hypertension; Vitamin D deficiency; Anticoagulant long-term use; Renal Transplant, s/p  09/2011; Depression, major, in remission ([place]); Thrombocytopenia ([place]); CAD (coronary artery disease) of artery bypass graft; IgA nephropathy; Essential tremor; Overweight (BMI 25.0-29.9); Senile purpura ([place]); Type 2 diabetes mellitus with stage 2 chronic kidney disease, with long-term current use of insulin ([place]); Former smoker (quit 1992); HNP (herniated nucleus pulposus), cervical; S/P cervical spinal fusion; Chronic hip pain, bilateral; Hypercoagulopathy ([place]); Mobitz I; A-V fistula ([place]); Trochanteric bursitis of left hip; First degree AV block; History of skin cancer; B12 deficiency; Other abnormal glucose (prediabetes); and Bursitis of right hip on their problem list.   Surgical: He  has a past surgical history that includes Colonoscopy; Peritoneal catheter insertion (2011); AV fistula placement (2011); Kidney transplant (09/25/2011); Coronary angioplasty with stent; Coronary artery bypass graft (2013); and Anterior cervical decomp/discectomy fusion (N/A, 05/04/2018). Family His family history includes Alcohol abuse in his brother and maternal grandfather; Alcoholism in his brother; Alzheimer's disease in his brother; COPD in his brother and mother; Diabetes in his mother; Heart attack in his father; Kidney disease in his sister. Social history  He reports that he quit smoking about 29 years ago. His smoking use included cigarettes. He started smoking about 54 years ago. He has a 48.00 pack-year smoking history. He has never used smokeless tobacco. He reports current alcohol use. He reports that  he does not use drugs.   Review of Systems  Constitutional: Negative for malaise/fatigue and weight loss.  HENT: Negative for hearing loss and tinnitus.   Eyes: Negative for blurred vision and double vision.  Respiratory: Negative for cough, sputum production, shortness of breath and wheezing.   Cardiovascular: Negative for chest pain, palpitations, orthopnea, claudication, leg swelling and PND.  Gastrointestinal: Negative for abdominal pain, blood in stool, constipation, diarrhea, heartburn, melena, nausea and vomiting.  Genitourinary: Negative.   Musculoskeletal: Positive for joint pain (R hip). Negative for falls and myalgias.  Skin: Negative for rash.  Neurological: Negative for dizziness, tingling, sensory change, weakness and headaches.  Endo/Heme/Allergies: Negative for polydipsia.  Psychiatric/Behavioral: Negative.  Negative for depression, memory loss, substance abuse and suicidal ideas. The patient is not nervous/anxious and does not have insomnia.   All other systems reviewed and are negative.     Objective:   Today's Vitals   06/21/20 0843  BP: (!) 142/76  Pulse: 83  Temp: (!) 97.5 F (36.4 C)  SpO2: 95%  Weight: 187 lb (84.8 kg)   Body mass index is 28.86 kg/m.  General Appearance: Well nourished, in no apparent distress. Eyes: PERRLA, EOMs, conjunctiva no swelling or erythema Sinuses: No Frontal/maxillary tenderness ENT/Mouth: Ext aud canals clear, TMs without erythema, bulging. No erythema, swelling, or exudate on post pharynx.  Tonsils not swollen or erythematous. Hearing normal.  Neck: Supple, thyroid normal.  Respiratory: Respiratory effort normal, BS equal bilaterally without rales, rhonchi, wheezing or stridor.  Cardio: Irregularly irregular, 3/6 early systolic blowing murmur. Brisk LE pulses, without edema. Has AV fistula to left forearm with thrill. Sensation intact, cap refill brisk Abdomen: Soft, + BS.  Non tender, no guarding, rebound, hernias,  masses. Lymphatics: Non tender without lymphadenopathy.  Musculoskeletal: Full ROM, 5/5 strength, normal gait. R hip with lateral tenderness over greater trochanter Skin: Warm, dry without rashes; small ecchymoses to bilateral upper extremities;  Neuro: Cranial nerves intact. Normal muscle tone, no cerebellar symptoms.  Psych: Awake and oriented X 3, normal affect, Insight and Judgment appropriate.    Izora Ribas, NP   06/21/2020

## 2020-06-21 ENCOUNTER — Encounter: Payer: Self-pay | Admitting: Adult Health

## 2020-06-21 ENCOUNTER — Other Ambulatory Visit: Payer: Self-pay | Admitting: *Deleted

## 2020-06-21 ENCOUNTER — Other Ambulatory Visit: Payer: Self-pay

## 2020-06-21 ENCOUNTER — Ambulatory Visit (INDEPENDENT_AMBULATORY_CARE_PROVIDER_SITE_OTHER): Payer: Medicare Other | Admitting: Adult Health

## 2020-06-21 VITALS — BP 142/76 | HR 83 | Temp 97.5°F | Wt 187.0 lb

## 2020-06-21 DIAGNOSIS — D692 Other nonthrombocytopenic purpura: Secondary | ICD-10-CM

## 2020-06-21 DIAGNOSIS — M7071 Other bursitis of hip, right hip: Secondary | ICD-10-CM | POA: Diagnosis not present

## 2020-06-21 DIAGNOSIS — E663 Overweight: Secondary | ICD-10-CM | POA: Diagnosis not present

## 2020-06-21 DIAGNOSIS — E559 Vitamin D deficiency, unspecified: Secondary | ICD-10-CM

## 2020-06-21 DIAGNOSIS — I10 Essential (primary) hypertension: Secondary | ICD-10-CM

## 2020-06-21 DIAGNOSIS — D696 Thrombocytopenia, unspecified: Secondary | ICD-10-CM | POA: Diagnosis not present

## 2020-06-21 DIAGNOSIS — D6859 Other primary thrombophilia: Secondary | ICD-10-CM

## 2020-06-21 DIAGNOSIS — I480 Paroxysmal atrial fibrillation: Secondary | ICD-10-CM

## 2020-06-21 DIAGNOSIS — M1A3790 Chronic gout due to renal impairment, unspecified ankle and foot, without tophus (tophi): Secondary | ICD-10-CM

## 2020-06-21 MED ORDER — ALLOPURINOL 300 MG PO TABS: ORAL_TABLET | ORAL | 0 refills | Status: DC

## 2020-06-21 MED ORDER — DEXAMETHASONE SODIUM PHOSPHATE 10 MG/ML IJ SOLN
10.0000 mg | Freq: Once | INTRAMUSCULAR | Status: AC
  Administered 2020-06-21: 10 mg

## 2020-06-21 NOTE — Patient Instructions (Signed)
Hip Bursitis Rehab Ask your health care provider which exercises are safe for you. Do exercises exactly as told by your health care provider and adjust them as directed. It is normal to feel mild stretching, pulling, tightness, or discomfort as you do these exercises. Stop right away if you feel sudden pain or your pain gets worse. Do not begin these exercises until told by your health care provider. Stretching exercise This exercise warms up your muscles and joints and improves the movement and flexibility of your hip. This exercise also helps to relieve pain and stiffness. Iliotibial band stretch An iliotibial band is a strong band of muscle tissue that runs from the outer side of your hip to the outer side of your thigh and knee. 1. Lie on your side with your left / right leg in the top position. 2. Bend your left / right knee and grab your ankle. Stretch out your bottom arm to help you balance. 3. Slowly bring your knee back so your thigh is behind your body. 4. Slowly lower your knee toward the floor until you feel a gentle stretch on the outside of your left / right thigh. If you do not feel a stretch and your knee will not fall farther, place the heel of your other foot on top of your knee and pull your knee down toward the floor with your foot. 5. Hold this position for __________ seconds. 6. Slowly return to the starting position. Repeat __________ times. Complete this exercise __________ times a day.   Strengthening exercises These exercises build strength and endurance in your hip and pelvis. Endurance is the ability to use your muscles for a long time, even after they get tired. Bridge This exercise strengthens the muscles that move your thigh backward (hip extensors). 1. Lie on your back on a firm surface with your knees bent and your feet flat on the floor. 2. Tighten your buttocks muscles and lift your buttocks off the floor until your trunk is level with your thighs. ? Do not arch  your back. ? You should feel the muscles working in your buttocks and the back of your thighs. If you do not feel these muscles, slide your feet 1-2 inches (2.5-5 cm) farther away from your buttocks. ? If this exercise is too easy, try doing it with your arms crossed over your chest. 3. Hold this position for __________ seconds. 4. Slowly lower your hips to the starting position. 5. Let your muscles relax completely after each repetition. Repeat __________ times. Complete this exercise __________ times a day.   Squats This exercise strengthens the muscles in front of your thigh and knee (quadriceps). 1. Stand in front of a table, with your feet and knees pointing straight ahead. You may rest your hands on the table for balance but not for support. 2. Slowly bend your knees and lower your hips like you are going to sit in a chair. ? Keep your weight over your heels, not over your toes. ? Keep your lower legs upright so they are parallel with the table legs. ? Do not let your hips go lower than your knees. ? Do not bend lower than told by your health care provider. ? If your hip pain increases, do not bend as low. 3. Hold the squat position for __________ seconds. 4. Slowly push with your legs to return to standing. Do not use your hands to pull yourself to standing. Repeat __________ times. Complete this exercise __________ times a day.  Hip hike 1. Stand sideways on a bottom step. Stand on your left / right leg with your other foot unsupported next to the step. You can hold on to the railing or wall for balance if needed. 2. Keep your knees straight and your torso square. Then lift your left / right hip up toward the ceiling. 3. Hold this position for __________ seconds. 4. Slowly let your left / right hip lower toward the floor, past the starting position. Your foot should get closer to the floor. Do not lean or bend your knees. Repeat __________ times. Complete this exercise __________ times  a day. Single leg stand 1. Without shoes, stand near a railing or in a doorway. You may hold on to the railing or door frame as needed for balance. 2. Squeeze your left / right buttock muscles, then lift up your other foot. ? Do not let your left / right hip push out to the side. ? It is helpful to stand in front of a mirror for this exercise so you can watch your hip. 3. Hold this position for __________ seconds. Repeat __________ times. Complete this exercise __________ times a day. This information is not intended to replace advice given to you by your health care provider. Make sure you discuss any questions you have with your health care provider. Document Revised: 07/20/2018 Document Reviewed: 07/20/2018 [employer] Patient Education  2021 [employer]  

## 2020-06-22 LAB — CBC WITH DIFFERENTIAL/PLATELET
Absolute Monocytes: 447 cells/uL (ref 200–950)
Basophils Absolute: 38 cells/uL (ref 0–200)
Basophils Relative: 0.8 %
Eosinophils Absolute: 80 cells/uL (ref 15–500)
Eosinophils Relative: 1.7 %
HCT: 43.7 % (ref 38.5–50.0)
Hemoglobin: 14.9 g/dL (ref 13.2–17.1)
Lymphs Abs: 1161 cells/uL (ref 850–3900)
MCH: 32 pg (ref 27.0–33.0)
MCHC: 34.1 g/dL (ref 32.0–36.0)
MCV: 93.8 fL (ref 80.0–100.0)
MPV: 9.9 fL (ref 7.5–12.5)
Monocytes Relative: 9.5 %
Neutro Abs: 2975 cells/uL (ref 1500–7800)
Neutrophils Relative %: 63.3 %
Platelets: 148 10*3/uL (ref 140–400)
RBC: 4.66 10*6/uL (ref 4.20–5.80)
RDW: 12.2 % (ref 11.0–15.0)
Total Lymphocyte: 24.7 %
WBC: 4.7 10*3/uL (ref 3.8–10.8)

## 2020-06-22 LAB — PROTIME-INR
INR: 2.4 — ABNORMAL HIGH
Prothrombin Time: 23 s — ABNORMAL HIGH (ref 9.0–11.5)

## 2020-06-28 ENCOUNTER — Other Ambulatory Visit: Payer: Self-pay | Admitting: Internal Medicine

## 2020-06-28 DIAGNOSIS — N138 Other obstructive and reflux uropathy: Secondary | ICD-10-CM

## 2020-06-28 DIAGNOSIS — N401 Enlarged prostate with lower urinary tract symptoms: Secondary | ICD-10-CM

## 2020-06-28 MED ORDER — FINASTERIDE 5 MG PO TABS: ORAL_TABLET | ORAL | 1 refills | Status: DC

## 2020-07-19 ENCOUNTER — Other Ambulatory Visit: Payer: Self-pay | Admitting: Internal Medicine

## 2020-07-19 MED ORDER — TRAZODONE HCL 50 MG PO TABS: ORAL_TABLET | ORAL | 3 refills | Status: DC

## 2020-07-24 NOTE — Progress Notes (Signed)
1 month follow up for Coumadin Assessment:     PAF (paroxysmal atrial fibrillation) (HCC) Check INR q month and will adjust medication according to labs.  Discussed if patient falls to immediately contact office or go to ER. Discussed foods that can increase or decrease Coumadin levels. Patient understands to call the office before starting a new medication. Follow up in one month.   Essential hypertension - continue medications, DASH diet, exercise and monitor at home. Call if greater than 130/80.   Thrombocytopenia ([place]) Check CBC at routine visits  Long term current use of anticoagulant therapy Monitor INR   Overweight  - long discussion about weight loss, diet, and exercise -recommended diet heavy in fruits and veggies and low in animal meats, cheeses, and dairy products  Senile purpura (HCC) R/t comadin, monitor, protect skin   Tongue ulcer Magic mouthwash with lidocaine 5 cc swish and spit q2h PRN sent in; monitor for resolution; follow up if persistent.   Over 15 minutes of exam, counseling, chart review, and critical decision making was performed  Future Appointments  Date Time Provider [place]  08/29/2020  2:30 PM Liane Comber, NP [facility] None  10/04/2020 11:30 AM [name], MD [facility] None  05/16/2021  2:00 PM Liane Comber, NP [facility] None    Subjective:  Robert Arnold is a 75 y.o. male who presents for 1 month coumadin follow up for a. fib, also htn, CHF, hx of CKD s/p transplant.   He reports left side of tongue has been sore x 1 week with ulcer, not sure he bit tongue, very sensitive when chewing and will catch on teeth, also notes some discomfort on left side of throat, for 3-4 days. Does note more cough than typical, but non-produtive. Denies fever/chills. Has been taking tylenol but limited benefit.   BMI is Body mass index is 29.01 kg/m., he has been working on diet, admits exercise has been limited with weather. Does have  a recumbent bike, would like to work up to 30 min 5 days a week. Walks in park when weather is good.  He drinks 1-2 cups of coffee. Minimal alochol. Drinks lots of water. Stress levels are low.  Wt Readings from Last 3 Encounters:  07/26/20 188 lb (85.3 kg)  06/21/20 187 lb (84.8 kg)  05/16/20 186 lb 6.4 oz (84.6 kg)   He has p. A. Fib, on coumadin; hx of CAD s/p stenting 09/2011 and CABG 02/2012, has done well since. 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  He has hx of systolic CHF, improved/resolved on most recent ECHO 04/2018 however with diastolic dysfunction and LVEF 50-55%.  Follows with Dr. Acie Fredrickson.  He has history of Afib and is on coumadin, he is on 7.5 mg on Wed and 5 mg other days, within goal range; no nose bleeds, blood stool/urine. No missed doses, no ABX.  Lab Results  Component Value Date   INR 2.4 (H) 06/21/2020   INR 2.9 (H) 05/16/2020   INR 1.9 (H) 01/31/2020   His blood pressure has been controlled at home (recently running 110/60s-07s), today their BP is BP: 138/82 He does workout, works around the house. He denies chest pain, shortness of breath, dizziness.    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  On prograf and myfortic, follows [facility]  Now back in CKD 2 stage;  Lab Results  Component Value Date   GFRNONAA 65 05/16/2020  Last A1C:  Lab Results  Component Value Date   HGBA1C 6.1 (H) 05/16/2020     Medication Review: Current Outpatient Medications on File Prior to Visit  Medication Sig Dispense Refill  . allopurinol (ZYLOPRIM) 300 MG tablet Take     1 tablet     Daily       to Prevent Gout 90 tablet 0  . Ascorbic Acid (VITAMIN C) 1000 MG tablet Take 1,000 mg by mouth daily.    Marland Kitchen aspirin EC 81 MG tablet Take 81 mg by mouth daily.    Marland Kitchen atorvastatin (LIPITOR) 40 MG tablet Take     1 tablet     Daily      for Cholesterol 90 tablet 0  . Black Pepper-Turmeric (TURMERIC  COMPLEX/BLACK PEPPER PO) Take 1,500 mg by mouth daily.    Marland Kitchen buPROPion (WELLBUTRIN XL) 300 MG 24 hr tablet TAKE ONE TABLET BY MOUTH EVERY MORNING FOR MOOD 90 tablet 2  . Calcium Carbonate-Vitamin D (OYSTER SHELL CALCIUM/D) 250-125 MG-UNIT TABS Take by mouth.    . finasteride (PROSCAR) 5 MG tablet Take  1 tablet  Daily  for Prostate 90 tablet 1  . gabapentin (NEURONTIN) 300 MG capsule TAKE ONE CAPSULE BY MOUTH THREE TIMES A DAY AS NEEDED FOR PAIN 270 capsule 1  . glucose blood (FREESTYLE TEST STRIPS) test strip Check blood sugar 3 to 4 times daily for medication regulation. 450 each PRN  . insulin NPH-regular Human (NOVOLIN 70/30) (70-30) 100 UNIT/ML injection Take 20-30 units 2 x day or as directed 10 mL 99  . latanoprost (XALATAN) 0.005 % ophthalmic solution Place 1 drop into both eyes at bedtime.   4  . MAGNESIUM-OXIDE 400 (241.3 Mg) MG tablet Take 400 mg by mouth 2 (two) times daily.   6  . mycophenolate (MYFORTIC) 360 MG TBEC EC tablet TAKE 1 TABLET BY MOUTH TWICE DAILY 180 tablet 1  . Probiotic Product (PROBIOTIC DAILY PO) Take 1 capsule by mouth daily.    . tacrolimus (PROGRAF) 0.5 MG capsule Take 0.5 mg by mouth 2 (two) times daily.    . tacrolimus (PROGRAF) 1 MG capsule Take 1 mg by mouth 2 (two) times daily.    . traZODone (DESYREL) 50 MG tablet Take  1 tablet  1 hour  before Bedtime  as needed for Sleep 90 tablet 3  . VITAMIN D PO Take 5,000 Units by mouth. Takes two 5000 unit capsules on MWF and 1 capsule 4 days a week.    . Zinc 50 MG CAPS Take 50 mg by mouth daily.     No current facility-administered medications on file prior to visit.    Allergies: Allergies  Allergen Reactions  . Losartan Anaphylaxis  . Crestor [Rosuvastatin]     Elevated LFT's  . Lorazepam     Pt is unsure of reaction   . Morphine And Related Other (See Comments)    Has no effect on pt     Current Problems (verified) has Chronic systolic heart failure ([place]); CKD stage 2 due to type 2 diabetes mellitus  ([place]); Hyperlipidemia associated with type 2 diabetes mellitus ([place]); BPH with obstruction/lower urinary tract symptoms; Gout; PAF (paroxysmal atrial fibrillation) ([place]); Essential hypertension; Vitamin D deficiency; Anticoagulant long-term use; Renal Transplant, s/p 09/2011; Depression, major, in remission ([place]); Thrombocytopenia ([place]); CAD (coronary artery disease) of artery bypass graft; IgA nephropathy; Essential tremor; Overweight (BMI 25.0-29.9); Senile purpura ([place]); Type 2 diabetes mellitus with stage 2 chronic kidney disease, with long-term current use  of insulin ([place]); Former smoker (quit 1992); HNP (herniated nucleus pulposus), cervical; S/P cervical spinal fusion; Chronic hip pain, bilateral; Hypercoagulopathy ([place]); Mobitz I; A-V fistula ([place]); Trochanteric bursitis of left hip; First degree AV block; History of skin cancer; B12 deficiency; Other abnormal glucose (prediabetes); and Bursitis of right hip on their problem list.   Surgical: He  has a past surgical history that includes Colonoscopy; Peritoneal catheter insertion (2011); AV fistula placement (2011); Kidney transplant (09/25/2011); Coronary angioplasty with stent; Coronary artery bypass graft (2013); and Anterior cervical decomp/discectomy fusion (N/A, 05/04/2018). Family His family history includes Alcohol abuse in his brother and maternal grandfather; Alcoholism in his brother; Alzheimer's disease in his brother; COPD in his brother and mother; Diabetes in his mother; Heart attack in his father; Kidney disease in his sister. Social history  He reports that he quit smoking about 29 years ago. His smoking use included cigarettes. He started smoking about 54 years ago. He has a 48.00 pack-year smoking history. He has never used smokeless tobacco. He reports current alcohol use. He reports that he does not use drugs.   Review of Systems  Constitutional: Negative for malaise/fatigue and weight loss.  HENT: Positive for sore throat  (with left tonue pain/ulcer). Negative for hearing loss and tinnitus.   Eyes: Negative for blurred vision and double vision.  Respiratory: Negative for cough, sputum production, shortness of breath and wheezing.   Cardiovascular: Negative for chest pain, palpitations, orthopnea, claudication, leg swelling and PND.  Gastrointestinal: Negative for abdominal pain, blood in stool, constipation, diarrhea, heartburn, melena, nausea and vomiting.  Genitourinary: Negative.   Musculoskeletal: Negative for falls, joint pain and myalgias.  Skin: Negative for rash.  Neurological: Negative for dizziness, tingling, sensory change, weakness and headaches.  Endo/Heme/Allergies: Negative for polydipsia.  Psychiatric/Behavioral: Negative.  Negative for depression, memory loss, substance abuse and suicidal ideas. The patient is not nervous/anxious and does not have insomnia.   All other systems reviewed and are negative.     Objective:   Today's Vitals   07/26/20 1134  BP: 138/82  Pulse: 72  Temp: (!) 97.3 F (36.3 C)  SpO2: 96%  Weight: 188 lb (85.3 kg)  Height: 5' 7.5" (1.715 m)   Body mass index is 29.01 kg/m.  General Appearance: Well nourished, in no apparent distress. Eyes: PERRLA, EOMs, conjunctiva no swelling or erythema Sinuses: No Frontal/maxillary tenderness ENT/Mouth: Ext aud canals clear, TMs without erythema, bulging. No erythema, swelling, or exudate on post pharynx. Left lateral tongue with small ~6 mm ulcer with white borders. Tonsils not swollen or erythematous. Hearing normal.  Neck: Supple, thyroid normal.  Respiratory: Respiratory effort normal, BS equal bilaterally without rales, rhonchi, wheezing or stridor.  Cardio: Irregularly irregular, 3/6 early systolic blowing murmur. Brisk LE pulses, without edema. Has AV fistula to left forearm with thrill. Sensation intact, cap refill brisk Abdomen: Soft, + BS.  Non tender, no guarding, rebound, hernias, masses. Lymphatics: Non  tender without lymphadenopathy.  Musculoskeletal: Full ROM, 5/5 strength, normal gait.  Skin: Warm, dry without rashes; small ecchymoses to bilateral upper extremities;  Neuro: Cranial nerves intact. Normal muscle tone, no cerebellar symptoms.  Psych: Awake and oriented X 3, normal affect, Insight and Judgment appropriate.    Izora Ribas, NP   07/26/2020

## 2020-07-26 ENCOUNTER — Other Ambulatory Visit: Payer: Self-pay

## 2020-07-26 ENCOUNTER — Encounter: Payer: Self-pay | Admitting: Adult Health

## 2020-07-26 ENCOUNTER — Ambulatory Visit (INDEPENDENT_AMBULATORY_CARE_PROVIDER_SITE_OTHER): Payer: Medicare Other | Admitting: Adult Health

## 2020-07-26 VITALS — BP 138/82 | HR 72 | Temp 97.3°F | Ht 67.5 in | Wt 188.0 lb

## 2020-07-26 DIAGNOSIS — K140 Glossitis: Secondary | ICD-10-CM

## 2020-07-26 DIAGNOSIS — I480 Paroxysmal atrial fibrillation: Secondary | ICD-10-CM | POA: Diagnosis not present

## 2020-07-26 DIAGNOSIS — I25708 Atherosclerosis of coronary artery bypass graft(s), unspecified, with other forms of angina pectoris: Secondary | ICD-10-CM

## 2020-07-26 DIAGNOSIS — E663 Overweight: Secondary | ICD-10-CM

## 2020-07-26 DIAGNOSIS — Z7901 Long term (current) use of anticoagulants: Secondary | ICD-10-CM

## 2020-07-26 DIAGNOSIS — D692 Other nonthrombocytopenic purpura: Secondary | ICD-10-CM

## 2020-07-26 DIAGNOSIS — I10 Essential (primary) hypertension: Secondary | ICD-10-CM | POA: Diagnosis not present

## 2020-07-26 DIAGNOSIS — D6859 Other primary thrombophilia: Secondary | ICD-10-CM

## 2020-07-26 MED ORDER — MAGIC MOUTHWASH W/LIDOCAINE: ORAL | 0 refills | Status: DC

## 2020-07-27 ENCOUNTER — Other Ambulatory Visit: Payer: Self-pay | Admitting: Adult Health

## 2020-07-27 DIAGNOSIS — I482 Chronic atrial fibrillation, unspecified: Secondary | ICD-10-CM

## 2020-07-27 LAB — CBC WITH DIFFERENTIAL/PLATELET
Absolute Monocytes: 394 cells/uL (ref 200–950)
Basophils Absolute: 62 cells/uL (ref 0–200)
Basophils Relative: 1.3 %
Eosinophils Absolute: 38 cells/uL (ref 15–500)
Eosinophils Relative: 0.8 %
HCT: 43 % (ref 38.5–50.0)
Hemoglobin: 14.5 g/dL (ref 13.2–17.1)
Lymphs Abs: 1070 cells/uL (ref 850–3900)
MCH: 31.3 pg (ref 27.0–33.0)
MCHC: 33.7 g/dL (ref 32.0–36.0)
MCV: 92.9 fL (ref 80.0–100.0)
MPV: 9.4 fL (ref 7.5–12.5)
Monocytes Relative: 8.2 %
Neutro Abs: 3235 cells/uL (ref 1500–7800)
Neutrophils Relative %: 67.4 %
Platelets: 136 10*3/uL — ABNORMAL LOW (ref 140–400)
RBC: 4.63 10*6/uL (ref 4.20–5.80)
RDW: 12.2 % (ref 11.0–15.0)
Total Lymphocyte: 22.3 %
WBC: 4.8 10*3/uL (ref 3.8–10.8)

## 2020-07-27 LAB — PROTIME-INR
INR: 3.3 — ABNORMAL HIGH
Prothrombin Time: 30.8 s — ABNORMAL HIGH (ref 9.0–11.5)

## 2020-07-27 MED ORDER — WARFARIN SODIUM 5 MG PO TABS: ORAL_TABLET | ORAL | 2 refills | Status: DC

## 2020-07-29 ENCOUNTER — Other Ambulatory Visit: Payer: Self-pay | Admitting: Internal Medicine

## 2020-07-29 MED ORDER — AZITHROMYCIN 250 MG PO TABS: ORAL_TABLET | ORAL | 0 refills | Status: DC

## 2020-07-29 MED ORDER — DEXAMETHASONE 2 MG PO TABS: ORAL_TABLET | ORAL | 0 refills | Status: DC

## 2020-08-28 NOTE — Progress Notes (Signed)
1 month follow up for Coumadin Assessment:     PAF (paroxysmal atrial fibrillation) (HCC) Check INR q month and will adjust medication according to labs.  Discussed if patient falls to immediately contact office or go to ER. Discussed foods that can increase or decrease Coumadin levels. Patient understands to call the office before starting a new medication. Follow up in one month.   Essential hypertension - continue medications, DASH diet, exercise and monitor at home. Call if greater than 130/80.   Thrombocytopenia ([place]) Check CBC at routine visits  Long term current use of anticoagulant therapy Monitor INR   Overweight  - long discussion about weight loss, diet, and exercise -recommended diet heavy in fruits and veggies and low in animal meats, cheeses, and dairy products  Senile purpura ([place]) R/t comadin, monitor, protect skin   Foaming urine Hx of transplant, check UA, forward to [facility]   Over 15 minutes of exam, counseling, chart review, and critical decision making was performed  Future Appointments  Date Time Provider [place]  10/04/2020 11:30 AM [name], MD [facility] None  05/16/2021  2:00 PM Liane Comber, NP [facility] None    Subjective:  Robert Arnold is a 75 y.o. male who presents for 1 month coumadin follow up for a. fib, also htn, CHF, hx of CKD s/p transplant.   He has hx of renal transplant, has noted foamy urine, requests check today.  [facility] follows.   BMI is Body mass index is 29.16 kg/m., he has been working on diet, has been working on cycling and walking, working up to 30 min days a week, plans to start weights and resistance bands.  He drinks 1-2 cups of coffee. Minimal alochol. Drinks lots of water. Stress levels are low.  Wt Readings from Last 3 Encounters:  08/29/20 189 lb (85.7 kg)  07/26/20 188 lb (85.3 kg)  06/21/20 187 lb (84.8 kg)   He has p. A. Fib, on coumadin; hx of CAD s/p stenting  09/2011 and CABG 02/2012, has done well since. 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  He has hx of systolic CHF, improved/resolved on most recent ECHO 04/2018 however with diastolic dysfunction and LVEF 50-55%.  Follows with Dr. Acie Fredrickson.  He has history of Afib and is on coumadin,  His coumadin was reduced 7.5 mg on Wed and 5 mg other days, to 5 mg daily last visit.   no nose bleeds, blood stool/urine. No missed doses, no ABX.  Lab Results  Component Value Date   INR 3.3 (H) 07/26/2020   INR 2.4 (H) 06/21/2020   INR 2.9 (H) 05/16/2020   His blood pressure has been controlled at home (recently running 110/60s-07s), today their BP is BP: 138/80 He does workout, works around the house. He denies chest pain, shortness of breath, dizziness.    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  On prograf and myfortic, follows [facility]  Now back in CKD 2 stage;  Lab Results  Component Value Date   GFRNONAA 65 05/16/2020     Medication Review: Current Outpatient Medications on File Prior to Visit  Medication Sig Dispense Refill  . allopurinol (ZYLOPRIM) 300 MG tablet Take     1 tablet     Daily       to Prevent Gout 90 tablet 0  . Ascorbic Acid (VITAMIN C) 1000 MG tablet Take 1,000 mg by mouth daily.    Marland Kitchen  aspirin EC 81 MG tablet Take 81 mg by mouth daily.    Marland Kitchen atorvastatin (LIPITOR) 40 MG tablet Take     1 tablet     Daily      for Cholesterol 90 tablet 0  . azithromycin (ZITHROMAX) 250 MG tablet Take 2 tablets with Food on  Day 1, then 1 tablet Daily with Food for Sinusitis / Bronchitis 6 each 0  . Black Pepper-Turmeric (TURMERIC COMPLEX/BLACK PEPPER PO) Take 1,500 mg by mouth daily.    Marland Kitchen buPROPion (WELLBUTRIN XL) 300 MG 24 hr tablet TAKE ONE TABLET BY MOUTH EVERY MORNING FOR MOOD 90 tablet 2  . Calcium Carbonate-Vitamin D (OYSTER SHELL CALCIUM/D) 250-125 MG-UNIT TABS Take by mouth.    . dexamethasone (DECADRON) 2  MG tablet Take 1 tab 3 x /day for 2 days,      then 2 x /day for 2  Days,     then 1 tab daily 13 tablet 0  . finasteride (PROSCAR) 5 MG tablet Take  1 tablet  Daily  for Prostate 90 tablet 1  . gabapentin (NEURONTIN) 300 MG capsule TAKE ONE CAPSULE BY MOUTH THREE TIMES A DAY AS NEEDED FOR PAIN 270 capsule 1  . glucose blood (FREESTYLE TEST STRIPS) test strip Check blood sugar 3 to 4 times daily for medication regulation. 450 each PRN  . insulin NPH-regular Human (NOVOLIN 70/30) (70-30) 100 UNIT/ML injection Take 20-30 units 2 x day or as directed 10 mL 99  . latanoprost (XALATAN) 0.005 % ophthalmic solution Place 1 drop into both eyes at bedtime.   4  . magic mouthwash w/lidocaine SOLN Swish and spit 5 ml every 2 hours as needed. 240 mL 0  . MAGNESIUM-OXIDE 400 (241.3 Mg) MG tablet Take 400 mg by mouth 2 (two) times daily.   6  . mycophenolate (MYFORTIC) 360 MG TBEC EC tablet TAKE 1 TABLET BY MOUTH TWICE DAILY 180 tablet 1  . Probiotic Product (PROBIOTIC DAILY PO) Take 1 capsule by mouth daily.    . tacrolimus (PROGRAF) 0.5 MG capsule Take 0.5 mg by mouth 2 (two) times daily.    . tacrolimus (PROGRAF) 1 MG capsule Take 1 mg by mouth 2 (two) times daily.    . traZODone (DESYREL) 50 MG tablet Take  1 tablet  1 hour  before Bedtime  as needed for Sleep 90 tablet 3  . VITAMIN D PO Take 5,000 Units by mouth. Takes two 5000 unit capsules on MWF and 1 capsule 4 days a week.    . warfarin (COUMADIN) 5 MG tablet TAKE ONE TO TWO TABLETS BY MOUTH EVERY DAY OR AS DIRECTED 180 tablet 2  . Zinc 50 MG CAPS Take 50 mg by mouth daily.     No current facility-administered medications on file prior to visit.    Allergies: Allergies  Allergen Reactions  . Losartan Anaphylaxis  . Crestor [Rosuvastatin]     Elevated LFT's  . Lorazepam     Pt is unsure of reaction   . Morphine And Related Other (See Comments)    Has no effect on pt     Current Problems (verified) has Chronic systolic heart failure ([place]);  CKD stage 2 due to type 2 diabetes mellitus ([place]); Hyperlipidemia associated with type 2 diabetes mellitus ([place]); BPH with obstruction/lower urinary tract symptoms; Gout; PAF (paroxysmal atrial fibrillation) ([place]); Essential hypertension; Vitamin D deficiency; Anticoagulant long-term use; Renal Transplant, s/p 09/2011; Depression, major, in remission ([place]); Thrombocytopenia ([place]); CAD (coronary artery disease) of artery  bypass graft; IgA nephropathy; Essential tremor; Overweight (BMI 25.0-29.9); Senile purpura ([place]); Type 2 diabetes mellitus with stage 2 chronic kidney disease, with long-term current use of insulin ([place]); Former smoker (quit 1992); HNP (herniated nucleus pulposus), cervical; S/P cervical spinal fusion; Chronic hip pain, bilateral; Hypercoagulopathy ([place]); Mobitz I; A-V fistula ([place]); First degree AV block; History of skin cancer; B12 deficiency; and Other abnormal glucose (prediabetes) on their problem list.   Surgical: He  has a past surgical history that includes Colonoscopy; Peritoneal catheter insertion (2011); AV fistula placement (2011); Kidney transplant (09/25/2011); Coronary angioplasty with stent; Coronary artery bypass graft (2013); and Anterior cervical decomp/discectomy fusion (N/A, 05/04/2018). Family His family history includes Alcohol abuse in his brother and maternal grandfather; Alcoholism in his brother; Alzheimer's disease in his brother; COPD in his brother and mother; Diabetes in his mother; Heart attack in his father; Kidney disease in his sister. Social history  He reports that he quit smoking about 29 years ago. His smoking use included cigarettes. He started smoking about 54 years ago. He has a 48.00 pack-year smoking history. He has never used smokeless tobacco. He reports current alcohol use. He reports that he does not use drugs.    Review of Systems  Constitutional: Negative for malaise/fatigue and weight loss.  HENT: Negative for hearing loss, sore throat  and tinnitus.   Eyes: Negative for blurred vision and double vision.  Respiratory: Negative for cough, sputum production, shortness of breath and wheezing.   Cardiovascular: Negative for chest pain, palpitations, orthopnea, claudication, leg swelling and PND.  Gastrointestinal: Negative for abdominal pain, blood in stool, constipation, diarrhea, heartburn, melena, nausea and vomiting.  Genitourinary: Negative.   Musculoskeletal: Negative for falls, joint pain and myalgias.  Skin: Negative for rash.  Neurological: Negative for dizziness, tingling, sensory change, weakness and headaches.  Endo/Heme/Allergies: Negative for polydipsia.  Psychiatric/Behavioral: Negative.  Negative for depression, memory loss, substance abuse and suicidal ideas. The patient is not nervous/anxious and does not have insomnia.   All other systems reviewed and are negative.     Objective:   Today's Vitals   08/29/20 1432  BP: 138/80  Pulse: (!) 59  Temp: (!) 96.1 F (35.6 C)  SpO2: 97%  Weight: 189 lb (85.7 kg)   Body mass index is 29.16 kg/m.  General Appearance: Well nourished, in no apparent distress. Eyes: PERRLA, EOMs, conjunctiva no swelling or erythema Sinuses: No Frontal/maxillary tenderness ENT/Mouth: Ext aud canals clear, TMs without erythema, bulging. No erythema, swelling, or exudate on post pharynx. Tonsils not swollen or erythematous. Bil hearing aids.  Neck: Supple, thyroid normal.  Respiratory: Respiratory effort normal, BS equal bilaterally without rales, rhonchi, wheezing or stridor.  Cardio: Irregularly irregular, 3/6 early systolic blowing murmur. Brisk LE pulses, without edema. Has AV fistula to left forearm with thrill. Sensation intact, cap refill brisk Abdomen: Soft, + BS.  Non tender, no guarding, rebound, hernias, masses. Lymphatics: Non tender without lymphadenopathy.  Musculoskeletal: Full ROM, 5/5 strength, normal gait.  Skin: Warm, dry without rashes; small ecchymoses to  bilateral upper extremities;  Neuro: Cranial nerves intact. Normal muscle tone, no cerebellar symptoms.  Psych: Awake and oriented X 3, normal affect, Insight and Judgment appropriate.    Izora Ribas, NP   08/29/2020

## 2020-08-29 ENCOUNTER — Ambulatory Visit (INDEPENDENT_AMBULATORY_CARE_PROVIDER_SITE_OTHER): Payer: Medicare Other | Admitting: Adult Health

## 2020-08-29 ENCOUNTER — Encounter: Payer: Self-pay | Admitting: Adult Health

## 2020-08-29 ENCOUNTER — Other Ambulatory Visit: Payer: Self-pay

## 2020-08-29 VITALS — BP 138/80 | HR 59 | Temp 96.1°F | Wt 189.0 lb

## 2020-08-29 DIAGNOSIS — D692 Other nonthrombocytopenic purpura: Secondary | ICD-10-CM | POA: Diagnosis not present

## 2020-08-29 DIAGNOSIS — R82998 Other abnormal findings in urine: Secondary | ICD-10-CM

## 2020-08-29 DIAGNOSIS — I480 Paroxysmal atrial fibrillation: Secondary | ICD-10-CM | POA: Diagnosis not present

## 2020-08-29 DIAGNOSIS — D696 Thrombocytopenia, unspecified: Secondary | ICD-10-CM

## 2020-08-29 DIAGNOSIS — I10 Essential (primary) hypertension: Secondary | ICD-10-CM | POA: Diagnosis not present

## 2020-08-29 DIAGNOSIS — Z7901 Long term (current) use of anticoagulants: Secondary | ICD-10-CM | POA: Diagnosis not present

## 2020-08-29 DIAGNOSIS — D6859 Other primary thrombophilia: Secondary | ICD-10-CM

## 2020-08-29 DIAGNOSIS — E663 Overweight: Secondary | ICD-10-CM

## 2020-08-30 LAB — URINALYSIS, ROUTINE W REFLEX MICROSCOPIC
Bilirubin Urine: NEGATIVE
Glucose, UA: NEGATIVE
Hgb urine dipstick: NEGATIVE
Ketones, ur: NEGATIVE
Leukocytes,Ua: NEGATIVE
Nitrite: NEGATIVE
Protein, ur: NEGATIVE
Specific Gravity, Urine: 1.025 (ref 1.001–1.035)
pH: 7 (ref 5.0–8.0)

## 2020-08-30 LAB — CBC WITH DIFFERENTIAL/PLATELET
Absolute Monocytes: 382 cells/uL (ref 200–950)
Basophils Absolute: 41 cells/uL (ref 0–200)
Basophils Relative: 0.9 %
Eosinophils Absolute: 41 cells/uL (ref 15–500)
Eosinophils Relative: 0.9 %
HCT: 43.6 % (ref 38.5–50.0)
Hemoglobin: 14.3 g/dL (ref 13.2–17.1)
Lymphs Abs: 1237 cells/uL (ref 850–3900)
MCH: 30.7 pg (ref 27.0–33.0)
MCHC: 32.8 g/dL (ref 32.0–36.0)
MCV: 93.6 fL (ref 80.0–100.0)
MPV: 9.6 fL (ref 7.5–12.5)
Monocytes Relative: 8.3 %
Neutro Abs: 2898 cells/uL (ref 1500–7800)
Neutrophils Relative %: 63 %
Platelets: 169 10*3/uL (ref 140–400)
RBC: 4.66 10*6/uL (ref 4.20–5.80)
RDW: 12.2 % (ref 11.0–15.0)
Total Lymphocyte: 26.9 %
WBC: 4.6 10*3/uL (ref 3.8–10.8)

## 2020-08-30 LAB — PROTIME-INR
INR: 2.3 — ABNORMAL HIGH
Prothrombin Time: 22.5 s — ABNORMAL HIGH (ref 9.0–11.5)

## 2020-09-13 ENCOUNTER — Other Ambulatory Visit: Payer: Self-pay

## 2020-09-13 DIAGNOSIS — E1169 Type 2 diabetes mellitus with other specified complication: Secondary | ICD-10-CM

## 2020-09-13 MED ORDER — ATORVASTATIN CALCIUM 40 MG PO TABS: ORAL_TABLET | ORAL | 0 refills | Status: DC

## 2020-09-20 ENCOUNTER — Other Ambulatory Visit: Payer: Self-pay | Admitting: Adult Health

## 2020-09-20 ENCOUNTER — Other Ambulatory Visit: Payer: Self-pay | Admitting: Internal Medicine

## 2020-09-20 DIAGNOSIS — M1A3790 Chronic gout due to renal impairment, unspecified ankle and foot, without tophus (tophi): Secondary | ICD-10-CM

## 2020-10-04 ENCOUNTER — Ambulatory Visit (INDEPENDENT_AMBULATORY_CARE_PROVIDER_SITE_OTHER): Payer: Medicare Other | Admitting: Internal Medicine

## 2020-10-04 ENCOUNTER — Encounter: Payer: Self-pay | Admitting: Internal Medicine

## 2020-10-04 ENCOUNTER — Other Ambulatory Visit: Payer: Self-pay

## 2020-10-04 VITALS — BP 126/76 | HR 77 | Temp 97.2°F | Resp 16 | Ht 68.0 in | Wt 186.6 lb

## 2020-10-04 DIAGNOSIS — E1122 Type 2 diabetes mellitus with diabetic chronic kidney disease: Secondary | ICD-10-CM | POA: Diagnosis not present

## 2020-10-04 DIAGNOSIS — I10 Essential (primary) hypertension: Secondary | ICD-10-CM

## 2020-10-04 DIAGNOSIS — I482 Chronic atrial fibrillation, unspecified: Secondary | ICD-10-CM | POA: Diagnosis not present

## 2020-10-04 DIAGNOSIS — E1169 Type 2 diabetes mellitus with other specified complication: Secondary | ICD-10-CM | POA: Diagnosis not present

## 2020-10-04 DIAGNOSIS — E559 Vitamin D deficiency, unspecified: Secondary | ICD-10-CM

## 2020-10-04 DIAGNOSIS — Z7901 Long term (current) use of anticoagulants: Secondary | ICD-10-CM

## 2020-10-04 DIAGNOSIS — M1000 Idiopathic gout, unspecified site: Secondary | ICD-10-CM

## 2020-10-04 DIAGNOSIS — N182 Chronic kidney disease, stage 2 (mild): Secondary | ICD-10-CM

## 2020-10-04 DIAGNOSIS — E785 Hyperlipidemia, unspecified: Secondary | ICD-10-CM

## 2020-10-04 DIAGNOSIS — Z79899 Other long term (current) drug therapy: Secondary | ICD-10-CM

## 2020-10-04 DIAGNOSIS — Z794 Long term (current) use of insulin: Secondary | ICD-10-CM

## 2020-10-04 NOTE — Progress Notes (Signed)
Future Appointments  Date Time Provider [place]  10/04/2020 11:30 AM [name], MD [facility] None  10/30/2020  8:45 AM Liane Comber, NP [facility] None  05/16/2021  - CPE  2:00 PM Liane Comber, NP [facility] None    History of Present Illness:       This very nice nice 75 y.o. MWM  presents for Coag  follow up with HTN, ASHD /CABG /pAfib, HLD, Pre-Diabetes and Vitamin D Deficiency.  Patient is on low dose Allopurinol for his Gout. 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       Patient is treated for HTN & BP has been controlled at home. Today's BP is at goal - 126/76.  After his Kidney Transplant in 2013, he had a po acute MI with PCA./Stenting. He also has hx/o Afib and is on coumadin . Patient has had no complaints of any cardiac type chest pain, palpitations, dyspnea / orthopnea / PND, dizziness, claudication, or dependent edema.       Hyperlipidemia is controlled with diet & meds. Patient denies myalgias or other med SE's. Last Lipids were at goal:  Lab Results  Component Value Date   CHOL 147 05/16/2020   HDL 64 05/16/2020   LDLCALC 66 05/16/2020   TRIG 87 05/16/2020   CHOLHDL 2.3 05/16/2020     Also, the patient has history of T2_NIDDM (2003) and was treated with Metformin til his Kidney transplant, then transitioned to Insulin.  Patient has had no symptoms of reactive hypoglycemia, diabetic polys, paresthesias or visual blurring.  Last A1c was not at goal:  Lab Results  Component Value Date   HGBA1C 6.1 (H) 05/16/2020                                                            Further, the patient also has history of Vitamin D Deficiency ("24" /2014) and supplements vitamin D without any suspected side-effects. Last vitamin D was at goal:  Lab Results  Component Value Date   VD25OH 13 07/19/2019     Current Outpatient Medications on File Prior to  Visit  Medication Sig   allopurinol 300 MG  Take  1 tablet  Daily    VITAMIN C 1000 MG  Take daily.   aspirin EC 81 MG Take daily.   atorvastatin  40 MG tablet Take 1 tablet Daily   Black Pepper-Turmeric 1,500 mg Take daily.   buPROPion-XL) 300 MG  Take  1 tablet  Daily    Calcium -Vit D 250 mg -125 u Take daily   finasteride 5 MG tablet Take  1 tablet  Daily  for Prostate   gabapentin 300 MG capsule TAKE 1 cap 3 x /day as needed   NOVOLIN 70/30 Take 20-30 units 2 x day or as directed   XALATAN ophth  soln Place 1 drop into both eyes at bedtime.    magic mouthwash w/lidocaine SOLN Swish and spit 5 ml every 2 hours as needed.   MAGNESIUM  400 MG tablet Take 2  times daily.    MYFORTIC 360 MG  TAKE 1 TABLET TWICE DAILY   Probiotic  Take 1 capsule daily.   PROGRAF 0.5 MG  capsule Take 0.5 mg 2  times daily.   tacrolimus 1 MG capsule Take 1 mg 2 times daily.   traZODone  50 MG tablet Take  1 tab 1 hr  before Bedtime  as needed    VITAMIN D  5,000 Units  Takes 2 caps on MWF &  1 cap 4 days/week.   warfarin 5 MG tablet TAKE 1-2 TABLETS EVERY DAY    Zinc 50 MG CAPS Take daily.    Allergies  Allergen Reactions   Losartan Anaphylaxis   Crestor [Rosuvastatin]     Elevated LFT's   Lorazepam     Pt is unsure of reaction    Morphine And Related Other (See Comments)    Has no effect on pt     PMHx:   Past Medical History:  Diagnosis Date   Adenomatous colon polyp    Allergy    BPH (benign prostatic hyperplasia)    CHF (congestive heart failure) (HCC)    Chronic kidney disease    due IgA nephropathy - s/p kidnet transplant 09/25/11   Diabetes mellitus type 2, controlled ([place])    FHx: heart disease 02/27/2018   Glaucoma    Gout    Histoplasmosis    on itraconazole for prophylaxis   MI (myocardial infarction) ([place]) 09/26/2011   NSTEMI (non-ST elevated myocardial infarction) ([place]) 11/26/2013   2013    OSA (obstructive sleep apnea)    PAF (paroxysmal atrial fibrillation) ([place])     s/p DC-CV in 6/13. Off coumadin due to ureteral bleed   Testosterone deficiency 02/27/2018    Immunization History  Administered Date(s) Administered   DT  06/05/2015   Hepatitis B 04/08/2009   Influenza, High Dose  01/02/2018, 12/21/2018, 01/31/2020   Influenz 12/25/2016   PFIZER SARS-COV-2 Vacc 07/24/2019, 08/17/2019   Pneumococcal - 13 10/25/2013   Pneumococcal - 23 06/05/2015   Pneumococcal - 23 04/08/2008   Tdap 04/08/2008   Zoster Recombinat (Shingrix) 02/12/2018, 06/02/2018     Past Surgical History:  Procedure Laterality Date   ANTERIOR CERVICAL DECOMP/DISCECTOMY Arne Cleveland, MD; N/A 05/04/2018   AV FISTULA PLACEMENT  2011   Left forearm   COLONOSCOPY     CORONARY ANGIOPLASTY WITH STENT PLACEMENT     CORONARY ARTERY BYPASS GRAFT  2013   KIDNEY TRANSPLANT  09/25/2011   PERITONEAL CATHETER INSERTION  2011    FHx:    Reviewed / unchanged  SHx:    Reviewed / unchanged   Systems Review:  Constitutional: Denies fever, chills, wt changes, headaches, insomnia, fatigue, night sweats, change in appetite. Eyes: Denies redness, blurred vision, diplopia, discharge, itchy, watery eyes.  ENT: Denies discharge, congestion, post nasal drip, epistaxis, sore throat, earache, hearing loss, dental pain, tinnitus, vertigo, sinus pain, snoring.  CV: Denies chest pain, palpitations, irregular heartbeat, syncope, dyspnea, diaphoresis, orthopnea, PND, claudication or edema. Respiratory: denies cough, dyspnea, DOE, pleurisy, hoarseness, laryngitis, wheezing.  Gastrointestinal: Denies dysphagia, odynophagia, heartburn, reflux, water brash, abdominal pain or cramps, nausea, vomiting, bloating, diarrhea, constipation, hematemesis, melena, hematochezia  or hemorrhoids. Genitourinary: Denies dysuria, frequency, urgency, nocturia, hesitancy, discharge, hematuria or flank pain. Musculoskeletal: Denies arthralgias, myalgias, stiffness, jt. swelling, pain, limping or strain/sprain.  Skin:  Denies pruritus, rash, hives, warts, acne, eczema or change in skin lesion(s). Neuro: No weakness, tremor, incoordination, spasms, paresthesia or pain. Psychiatric: Denies confusion, memory loss or sensory loss. Endo: Denies change in weight, skin or hair change.  Heme/Lymph: No excessive bleeding, bruising or enlarged lymph nodes.  Physical Exam  BP  126/76   Pulse 77   Temp (!) 97.2 F (36.2 C)   Resp 16   Ht 5\' 8"  (1.727 m)   Wt 186 lb 9.6 oz (84.6 kg)   SpO2 99%   BMI 28.37 kg/m   Appears  well nourished, well groomed  and in no distress.  Eyes: PERRLA, EOMs, conjunctiva no swelling or erythema. Sinuses: No frontal/maxillary tenderness ENT/Mouth: EAC's clear, TM's nl w/o erythema, bulging. Nares clear w/o erythema, swelling, exudates. Oropharynx clear without erythema or exudates. Oral hygiene is good. Tongue normal, non obstructing. Hearing intact.  Neck: Supple. Thyroid not palpable. Car 2+/2+ without bruits, nodes or JVD. Chest: Respirations nl with BS clear & equal w/o rales, rhonchi, wheezing or stridor.  Cor: Heart sounds normal w/ regular rate and rhythm without sig. murmurs, gallops, clicks or rubs. Peripheral pulses normal and equal  without edema.  Abdomen: Soft & bowel sounds normal. Non-tender w/o guarding, rebound, hernias, masses or organomegaly.  Lymphatics: Unremarkable.  Musculoskeletal: Full ROM all peripheral extremities, joint stability, 5/5 strength and normal gait.  Skin: Warm, dry without exposed rashes, lesions or ecchymosis apparent.  Neuro: Cranial nerves intact, reflexes equal bilaterally. Sensory-motor testing grossly intact. Tendon reflexes grossly intact.  Pysch: Alert & oriented x 3.  Insight and judgement nl & appropriate. No ideations.  Assessment and Plan:  1. Essential hypertension  - Continue medication, monitor blood pressure at home.  - Continue DASH diet.  Reminder to go to the ER if any CP,  SOB, nausea, dizziness, severe HA, changes  vision/speech.  2. Hyperlipidemia associated with type 2 diabetes mellitus ([place])  - Continue diet/meds, exercise,& lifestyle modifications.  - Continue monitor periodic cholesterol/liver & renal functions    3. Type 2 diabetes mellitus with stage 2 chronic kidney  disease, with long-term current use of insulin (HCC)  - Continue diet, exercise  - Lifestyle modifications.  - Monitor appropriate labs.  - Hemoglobin A1c  4. Chronic atrial fibrillation (HCC)  - Continue supplementation.   - Protime-INR  5. Vitamin D deficiency   6. Idiopathic gout  7. Anticoagulant long-term use  - Protime-INR  8. Medication management  - Hemoglobin A1c         Discussed  regular exercise, BP monitoring, weight control to achieve/maintain BMI less than 25 and discussed med and SE's. Recommended labs to assess and monitor clinical status with further disposition pending results of labs.  I discussed the assessment and treatment plan with the patient. The patient was provided an opportunity to ask questions and all were answered. The patient agreed with the plan and demonstrated an understanding of the instructions.  I provided over 30 minutes of exam, counseling, chart review and  complex critical decision making.        The patient was advised to call back or seek an in-person evaluation if the symptoms worsen or if the condition fails to improve as anticipated.   Kirtland Bouchard, MD

## 2020-10-04 NOTE — Patient Instructions (Signed)
Due to recent changes in healthcare laws, you may see the results of your imaging and laboratory studies on MyChart before your provider has had a chance to review them.  We understand that in some cases there may be results that are confusing or concerning to you. Not all laboratory results come back in the same time frame and the provider may be waiting for multiple results in order to interpret others.  Please give [place] 48 hours in order for your provider to thoroughly review all the results before contacting the office for clarification of your results.  ++++++++++++++++++++++++++++++++++  Vit D  & Vit C 1,000 mg   are recommended to help protect  against the Covid-19 and other Corona viruses.    Also it's recommended  to take  Zinc 50 mg  to help  protect against the Covid-19   and best place to get  is also on [email or web address]  and don't pay more than 6-8 cents /pill !   ===================================== Coronavirus (COVID-19) Are you at risk?  Are you at risk for the Coronavirus (COVID-19)?  To be considered HIGH RISK for Coronavirus (COVID-19), you have to meet the following criteria:  Traveled to [place], [place], [place], [place] or [place]; or in the [place] to [place], [place], [place]  or [place]; and have fever, cough, and shortness of breath within the last 2 weeks of travel OR Been in close contact with a person diagnosed with COVID-19 within the last 2 weeks and have  fever, cough,and shortness of breath  IF YOU DO NOT MEET THESE CRITERIA, YOU ARE CONSIDERED LOW RISK FOR COVID-19.  What to do if you are HIGH RISK for COVID-19?  If you are having a medical emergency, call 911. Seek medical care right away. Before you go to a doctor's office, urgent care or emergency department,  call ahead and tell them about your recent travel, contact with someone diagnosed with COVID-19   and your symptoms.  You should receive instructions from your physician's office  regarding next steps of care.  When you arrive at healthcare provider, tell the healthcare staff immediately you have returned from  visiting [place], [place], [place], [place] or [place]; or traveled in the [place] to [place], [place],  [place] or [place] in the last two weeks or you have been in close contact with a person diagnosed with  COVID-19 in the last 2 weeks.   Tell the health care staff about your symptoms: fever, cough and shortness of breath. After you have been seen by a medical provider, you will be either: Tested for (COVID-19) and discharged home on quarantine except to seek medical care if  symptoms worsen, and asked to  Stay home and avoid contact with others until you get your results (4-5 days)  Avoid travel on public transportation if possible (such as bus, train, or airplane) or Sent to the Emergency Department by EMS for evaluation, COVID-19 testing  and  possible admission depending on your condition and test results.  What to do if you are LOW RISK for COVID-19?  Reduce your risk of any infection by using the same precautions used for avoiding the common cold or flu:  Wash your hands often with soap and warm water for at least 20 seconds.  If soap and water are not readily available,  use an alcohol-based hand sanitizer with at least 60% alcohol.  If coughing or sneezing, cover your mouth and nose by coughing  or sneezing into the elbow areas of your shirt or coat,  into a tissue or into your sleeve (not your hands). Avoid shaking hands with others and consider head nods or verbal greetings only. Avoid touching your eyes, nose, or mouth with unwashed hands.  Avoid close contact with people who are sick. Avoid places or events with large numbers of people in one location, like concerts or sporting events. Carefully consider travel plans you have or are making. If you are planning any travel outside or inside the [place], visit the CDC's Travelers' Health  webpage for the latest health notices. If you have some symptoms but not all symptoms, continue to monitor at home and seek medical attention  if your symptoms worsen. If you are having a medical emergency, call 911.   ++++++++++++++++++++++++++++++++ Recommend Adult Low Dose Aspirin or  coated  Aspirin 81 mg daily  To reduce risk of Colon Cancer 40 %,  Skin Cancer 26 % ,  Melanoma 46%  and  Pancreatic cancer 60% ++++++++++++++++++++++++++++++++ Vitamin D goal  is between 70-100.  Please make sure that you are taking your Vitamin D as directed.  It is very important as a natural anti-inflammatory  helping hair, skin, and nails, as well as reducing stroke and heart attack risk.  It helps your bones and helps with mood. It also decreases numerous cancer risks so please take it as directed.  Low Vit D is associated with a 200-300% higher risk for CANCER  and 200-300% higher risk for HEART   ATTACK  &  STROKE.   .....................................Marland Kitchen It is also associated with higher death rate at younger ages,  autoimmune diseases like Rheumatoid arthritis, Lupus, Multiple Sclerosis.    Also many other serious conditions, like depression, Alzheimer's Dementia, infertility, muscle aches, fatigue, fibromyalgia - just to name a few. ++++++++++++++++++++ Recommend the book "The END of DIETING" by Dr Excell Seltzer  & the book "The END of DIABETES " by Dr Excell Seltzer At [email or web address] - get book & Audio CD's    Being diabetic has a  300% increased risk for heart attack, stroke, cancer, and alzheimer- type vascular dementia. It is very important that you work harder with diet by avoiding all foods that are white. Avoid white rice (brown & wild rice is OK), white potatoes (sweetpotatoes in moderation is OK), White bread or wheat bread or anything made out of white flour like bagels, donuts, rolls, buns, biscuits, cakes, pastries, cookies, pizza crust, and pasta (made from white flour & egg whites)  - vegetarian pasta or spinach or wheat pasta is OK. Multigrain breads like Arnold's or [employer], or multigrain sandwich thins or flatbreads.  Diet, exercise and weight loss can reverse and cure diabetes in the early stages.  Diet, exercise and weight loss is very important in the control and prevention of complications of diabetes which affects every system in your body, ie. Brain - dementia/stroke, eyes - glaucoma/blindness, heart - heart attack/heart failure, kidneys - dialysis, stomach - gastric paralysis, intestines - malabsorption, nerves - severe painful neuritis, circulation - gangrene & loss of a leg(s), and finally cancer and Alzheimers.    I recommend avoid fried & greasy foods,  sweets/candy, white rice (brown or wild rice or Quinoa is OK), white potatoes (sweet potatoes are OK) - anything made from white flour - bagels, doughnuts, rolls, buns, biscuits,white and wheat breads, pizza crust and traditional pasta made of white flour & egg white(vegetarian pasta or spinach or wheat pasta is OK).  Multi-grain bread is OK - like multi-grain flat bread or sandwich thins. Avoid alcohol in excess. Exercise is also important.    Eat all the vegetables you want - avoid meat, especially red meat and dairy - especially cheese.  Cheese is the most concentrated form of trans-fats which is the worst thing to clog up our arteries. Veggie cheese is OK which can be found in the fresh produce section at [employer] or [employer] or [employer]  +++++++++++++++++++++ DASH Eating Plan  DASH stands for "Dietary Approaches to Stop Hypertension."   The DASH eating plan is a healthy eating plan that has been shown to reduce high blood pressure (hypertension). Additional health benefits may include reducing the risk of type 2 diabetes mellitus, heart disease, and stroke. The DASH eating plan may also help with weight loss. WHAT DO I NEED TO KNOW ABOUT THE DASH EATING PLAN? For the DASH eating plan, you will  follow these general guidelines: Choose foods with a percent daily value for sodium of less than 5% (as listed on the food label). Use salt-free seasonings or herbs instead of table salt or sea salt. Check with your health care provider or pharmacist before using salt substitutes. Eat lower-sodium products, often labeled as "lower sodium" or "no salt added." Eat fresh foods. Eat more vegetables, fruits, and low-fat dairy products. Choose whole grains. Look for the word "whole" as the first word in the ingredient list. Choose fish  Limit sweets, desserts, sugars, and sugary drinks. Choose heart-healthy fats. Eat veggie cheese  Eat more home-cooked food and less restaurant, buffet, and fast food. Limit fried foods. Cook foods using methods other than frying. Limit canned vegetables. If you do use them, rinse them well to decrease the sodium. When eating at a restaurant, ask that your food be prepared with less salt, or no salt if possible.                      WHAT FOODS CAN I EAT? Read Dr Fara Olden Fuhrman's books on The End of Dieting & The End of Diabetes  Grains Whole grain or whole wheat bread. Brown rice. Whole grain or whole wheat pasta. Quinoa, bulgur, and whole grain cereals. Low-sodium cereals. Corn or whole wheat flour tortillas. Whole grain cornbread. Whole grain crackers. Low-sodium crackers.  Vegetables Fresh or frozen vegetables (raw, steamed, roasted, or grilled). Low-sodium or reduced-sodium tomato and vegetable juices. Low-sodium or reduced-sodium tomato sauce and paste. Low-sodium or reduced-sodium canned vegetables.   Fruits All fresh, canned (in natural juice), or frozen fruits.  Protein Products  All fish and seafood.  Dried beans, peas, or lentils. Unsalted nuts and seeds. Unsalted canned beans.  Dairy Low-fat dairy products, such as skim or 1% milk, 2% or reduced-fat cheeses, low-fat ricotta or cottage cheese, or plain low-fat yogurt. Low-sodium or reduced-sodium  cheeses.  Fats and Oils Tub margarines without trans fats. Light or reduced-fat mayonnaise and salad dressings (reduced sodium). Avocado. Safflower, olive, or canola oils. Natural peanut or almond butter.  Other Unsalted popcorn and pretzels. The items listed above may not be a complete list of recommended foods or beverages. Contact your dietitian for more options.  +++++++++++++++  WHAT FOODS ARE NOT RECOMMENDED? Grains/ White flour or wheat flour White bread. White pasta. White rice. Refined cornbread. Bagels and croissants. Crackers that contain trans fat.  Vegetables  Creamed or fried vegetables. Vegetables in a . Regular canned vegetables. Regular canned tomato sauce and paste. Regular tomato and vegetable juices.  Fruits Dried fruits. Canned fruit in light or heavy syrup. Fruit juice.  Meat and Other Protein Products Meat in general - RED meat & White meat.  Fatty cuts of meat. Ribs, chicken wings, all processed meats as bacon, sausage, bologna, salami, fatback, hot dogs, bratwurst and packaged luncheon meats.  Dairy Whole or 2% milk, cream, half-and-half, and cream cheese. Whole-fat or sweetened yogurt. Full-fat cheeses or blue cheese. Non-dairy creamers and whipped toppings. Processed cheese, cheese spreads, or cheese curds.  Condiments Onion and garlic salt, seasoned salt, table salt, and sea salt. Canned and packaged gravies. Worcestershire sauce. Tartar sauce. Barbecue sauce. Teriyaki sauce. Soy sauce, including reduced sodium. Steak sauce. Fish sauce. Oyster sauce. Cocktail sauce. Horseradish. Ketchup and mustard. Meat flavorings and tenderizers. Bouillon cubes. Hot sauce. Tabasco sauce. Marinades. Taco seasonings. Relishes.  Fats and Oils Butter, stick margarine, lard, shortening and bacon fat. Coconut, palm kernel, or palm oils. Regular salad dressings.  Pickles and olives. Salted popcorn and pretzels.  The items listed above may not be a complete list of foods and  beverages to avoid.   Warfarin Coagulopathy Warfarin coagulopathy refers to bleeding that may occur as a complication of the medicine warfarin. Warfarin is a blood thinner (anticoagulant). Anticoagulants prevent dangerous blood clots. Bleeding is the most commonand most serious complication of warfarin. While taking warfarin, you will need to have blood tests (prothrombin tests, or PT tests) regularly to measure your blood clotting time. The PT test results will be reported as the International Normalized Ratio (INR). The INR tells your health care provider whether your dosage of warfarin needs to be changed. The longer it takes your blood to clot, the higher the INR. Your riskof warfarin coagulopathy increases as your INR increases. What are the causes? This condition may be caused by: Taking too much warfarin (overdose). Underlying medical conditions. Changes to your diet. Interactions with medicines, supplements, or alcohol. What are the signs or symptoms? Warfarin coagulopathy may cause bleeding from any tissue or organ. Symptoms may include: Bleeding from the gums. A nosebleed that is not easily stopped. Blood in stool. This may look like bright red, dark, or black, tarry stools. Blood in urine. This may look like pink, red, or brown urine. Unusual bruising or bruising easily. A cut that does not stop bleeding within 10 minutes. Coughing up blood. Vomiting blood. Feeling nauseous for longer than 1 day. Broken blood vessels in the eye (subconjunctival hemorrhage). This may look like a bright red or dark red patch on the white part of the eye. Abdominal or back pain with or without bruising. Sudden, severe headache. Sudden weakness or numbness of the face, arm, or leg, especially on one side of the body. Sudden confusion. Difficulty speaking (aphasia) or understanding speech. Sudden trouble seeing out of one or both eyes. Unexpected difficulty walking. Dizziness. Loss of balance or  coordination. Unusual vaginal bleeding. Swelling or pain at an injection site. Skin scarring due to tissue death (necrosis) of fatty tissue. This may cause pain in the waist, thighs, or buttocks. This is more common among women. How is this diagnosed? This condition is diagnosed after your health care provider places you on warfarin and then finds out how it affects your blood's ability to clot. Prothrombin time (PT) clotting tests are used to monitor your clotting factor. These tests also help your health care provider to find the warfarin dose thatis best for you. How is this treated? This condition is treated with vitamin K. Vitamin K helps the blood  to clot. You may receive vitamin K every 12 hours, or as needed. You may also receive donated plasma (transfusions of fresh frozen plasma). Plasma is the liquid part of blood, and contains substances that help theblood clot. Follow these instructions at home: Medicines Take warfarin exactly as told by your health care provider. This ensures that you avoid bleeding or clots that could result in serious injury, pain, or disability. Take your medicine at the same time every day. If you forget to take your dose of warfarin, take it as soon as you remember on that day. If you do not remember to take it on that day, do not take an extra dose the next day. Contact your health care provider if you miss a dose or take an extra dose. Do not change your dosage on your own to make up for missed or extra doses. Talk with your health care provider or your pharmacist before starting or stopping any new medicines. Many prescription and over-the-counter medicines can interfere with warfarin. This includes over-the-counter vitamins, dietary supplements, herbal medicines, and pain medicines. Your warfarin dosage may need to be adjusted. Eating and drinking It is important to maintain a normal, balanced diet while taking warfarin. Avoid major changes in your diet. If you  are planning to change your diet, talk with your health care provider before making changes. Your health care provider may recommend that you work with a diet and nutrition specialist (dietitian). Vitamin K makes warfarin less effective. It is found in many foods. Eat a consistent amount of foods that contain vitamin K. For example, you may decide to eat 2 vitamin K-containing foods each day. Your warfarin dose is set according to the amount of vitamin K in your blood. Eat a consistent amount of foods that contain vitamin K. Vitamin K makes warfarin less effective, so eating the same amount each day enables your health care provider to set the correct dose of warfarin. You may decide to eat 2 vitamin K-containing foods each day. Tests  Make sure to have PT tests at least once every 4-6 weeks for the entire time you are taking warfarin. Ask your health care provider what your target INR range is. Make sure you always know your target range. If your INR is not in your target range, your health care provider may adjust your dosage.  Preventing bleeding and injury Some common over-the-counter medicines and supplements may increase the risk of bleeding while taking warfarin. They include: Acetaminophen. Aspirin. NSAIDs, such as ibuprofen or naproxen. Vitamin E. Avoid situations that cause bleeding. You may bleed more easily while taking warfarin. To limit bleeding, take the following actions: Use a softer toothbrush. Floss with waxed floss, not unwaxed floss. Shave with an electric razor, not with a blade. Limit your use of sharp objects. Avoid potentially harmful activities, such as contact sports. General instructions Wear or carry identification that says that you are taking warfarin. Make sure that all health care providers, including your dentist, know that you are taking warfarin. If you need surgery, tell your health care provider that you are taking warfarin. You may have to stop taking  warfarin before your surgery. If you plan to breastfeed or become pregnant while taking warfarin, talk with your health care provider. Avoid alcohol, tobacco, and drugs. If your health care provider approves, limit alcohol intake to no more than 1 drink a day for non-pregnant women and 2 drinks a day for men. One drink equals 12 oz. of beer, 5  oz. of wine, or 1 oz. of hard liquor. If you change the amount of nicotine, tobacco, or alcohol you use, tell your health care provider. Keep all follow-up visits and lab visits as told by your health care provider. This is very important because warfarin is a medicine that needs to be closely monitored. Contact a health care provider if: You miss a dose. You take an extra dose. You plan to have any kind of surgery or procedure. You are unable to take your medicine due to nausea, vomiting, or diarrhea. You have any major changes in your diet, or you plan to make major changes in your diet. You start or stop any over-the-counter medicine, prescription medicine, or dietary supplement. You become pregnant, plan to become pregnant, or think you may be pregnant. You have menstrual periods that are heavier than usual, or unusual vaginal bleeding. You have unusual bruising. You lose your appetite. You have a fever. You have diarrhea that lasts for more than 24 hours. Get help right away if: You develop symptoms of an allergic reaction, such as: Swelling of the lips, face, tongue, mouth, or throat. Rash. Itching. Itchy, red, swollen areas of skin (hives). Trouble breathing. Chest tightness. You have any symptoms of stroke. BEFAST is an easy way to remember the main warning signs of stroke: B - Balance. Signs are dizziness, sudden trouble walking, or loss of balance. E - Eye. Signs are trouble seeing or a sudden change in vision. F - Face. Signs are sudden weakness or numbness of the face, or the face or eyelid drooping on one side. A - Arm. Signs are  weakness or numbness in an arm. This happens suddenly and usually on one side of the body. S - Speech. Signs are trouble speaking, slurred speech, or trouble understanding speech. T - Time. Time to call emergency services. Write down the time your symptoms started. You have other signs of stroke, such as: A sudden, severe headache with no known cause. Nausea or vomiting. Seizure. You have signs or symptoms of a blood clot, such as: Pain or swelling in your leg or arm. Skin that is red or warm to the touch on your arm or leg. Shortness of breath or difficulty breathing. Chest pain. Unexplained fever. You have: A fall or have an accident, especially if you hit your head. Blood in your urine. Your urine may look reddish, pinkish, or tea-colored. Blood in your stool. Your stool may be black or bright red. Bleeding that does not stop after applying pressure to the area for 30 minutes. Severe pain in your joints or back. Purple or blue toes. Skin ulcers that do not go away. You vomit blood or cough up blood. The blood may be bright red, or it may look like coffee grounds. These symptoms may represent a serious problem that is an emergency. Do not wait to see if the symptoms will go away. Get medical help right away. Call your local emergency services (911 in the [place] Do not drive yourself to the hospital. Summary Warfarin needs to be closely monitored with blood tests. It is very important to keep all lab visits and follow-up visits with your health care provider. Make sure you know your target INR range and your warfarin dosage. Monitor how much vitamin K you eat every day. Try to eat the same amount every day. Wear or carry identification that says that you are taking warfarin. Take warfarin at the same time every day. Call your health care provider  if you miss a dose or if you take an extra dose. Do not change the dosage of warfarin on your own. Know the signs and symptoms of blood clots,  bleeding, and stroke. Know when to get emergency medical help. This information is not intended to replace advice given to you by your health care provider. Make sure you discuss any questions you have with your healthcare provider. Document Revised: 03/21/2020 Document Reviewed: 09/23/2019 Elsevier Patient Education  [place].

## 2020-10-05 LAB — HEMOGLOBIN A1C
Hgb A1c MFr Bld: 5.9 % of total Hgb — ABNORMAL HIGH (ref <5.7)
Mean Plasma Glucose: 123 mg/dL
eAG (mmol/L): 6.8 mmol/L

## 2020-10-05 LAB — PROTIME-INR
INR: 2 — ABNORMAL HIGH
Prothrombin Time: 19.9 s — ABNORMAL HIGH (ref 9.0–11.5)

## 2020-10-05 NOTE — Progress Notes (Signed)
============================================================ -   Test results slightly outside the reference range are not unusual. If there is anything important, I will review this with you,  otherwise it is considered normal test values.  If you have further questions,  please do not hesitate to contact me at the office or via My Chart.  ============================================================ ============================================================  -  A1c = 5.9% - Better, but still slightly elevated  (  Goal is less than 5.7% ) ============================================================ ============================================================  -  Pt /INR = 2.0 x  - Please keep Coumadin dose same ============================================================ ============================================================

## 2020-10-08 ENCOUNTER — Encounter: Payer: Self-pay | Admitting: Internal Medicine

## 2020-10-27 NOTE — Progress Notes (Signed)
1 month follow up for Coumadin Assessment:     PAF (paroxysmal atrial fibrillation) (HCC) Check INR q month and will adjust medication according to labs.  Discussed if patient falls to immediately contact office or go to ER. Discussed foods that can increase or decrease Coumadin levels. Patient understands to call the office before starting a new medication. Follow up in one month.   Essential hypertension - continue medications, DASH diet, exercise and monitor at home. Call if greater than 130/80.   Thrombocytopenia ([place]) Check CBC at routine visits  Long term current use of anticoagulant therapy Monitor INR   Overweight  - long discussion about weight loss, diet, and exercise -recommended diet heavy in fruits and veggies and low in animal meats, cheeses, and dairy products  Senile purpura ([place]) R/t comadin, monitor, protect skin   B12  Newly on supplement, recheck levels Discussed witch to SL if not improved  Over 15 minutes of exam, counseling, chart review, and critical decision making was performed  Future Appointments  Date Time Provider [place]  11/30/2020  8:45 AM [facility] NURSE [facility] None  01/01/2021  8:45 AM [facility] NURSE [facility] None  05/16/2021  2:00 PM Liane Comber, NP [facility] None    Subjective:  Robert Arnold is a 75 y.o. male who presents for 1 month coumadin follow up for a. fib, also htn, CHF, hx of CKD s/p transplant.   BMI is Body mass index is 28.74 kg/m., he has been working on diet, has been working on cycling and walking, working up to 30 min days a week, plans to start weights and resistance bands.  He drinks 1-2 cups of coffee. Minimal alochol. Drinks lots of water. Stress levels are low.  Wt Readings from Last 3 Encounters:  10/30/20 189 lb (85.7 kg)  10/04/20 186 lb 9.6 oz (84.6 kg)  08/29/20 189 lb (85.7 kg)   He has p. A. Fib, on coumadin; hx of CAD s/p stenting 09/2011 and CABG 02/2012, has done well since.  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  He has hx of systolic CHF, improved/resolved on most recent ECHO 04/2018 however with diastolic dysfunction and LVEF 50-55%.  Follows with Dr. Acie Fredrickson.  He has history of Afib and is on coumadin,  His coumadin was not changed last visit - currently taking 5 mg daily   no nose bleeds, blood stool/urine. No missed doses, no ABX.  Lab Results  Component Value Date   INR 2.0 (H) 10/04/2020   INR 2.3 (H) 08/29/2020   INR 3.3 (H) 07/26/2020   His blood pressure has been controlled at home (recently running 110/60s-07s), today their BP is BP: 128/70 He does workout, works around the house. He denies chest pain, shortness of breath, dizziness.    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  On prograf and myfortic, follows [facility]  Now back in CKD 2 stage;  Lab Results  Component Value Date   GFRNONAA 65 05/16/2020   He reports has been on new B complex (unsure dose) for 4-5 months.  Lab Results  Component Value Date   WSFKCLEX51 700 05/16/2020      Medication Review: Current Outpatient Medications on File Prior to Visit  Medication Sig Dispense Refill   allopurinol (ZYLOPRIM) 300 MG tablet Take  1 tablet  Daily  to Prevent  Gout 90 tablet 3   Ascorbic Acid (VITAMIN C) 1000 MG tablet Take  1,000 mg by mouth daily.     aspirin EC 81 MG tablet Take 81 mg by mouth daily.     atorvastatin (LIPITOR) 40 MG tablet Take     1 tablet     Daily      for Cholesterol 90 tablet 0   Black Pepper-Turmeric (TURMERIC COMPLEX/BLACK PEPPER PO) Take 1,500 mg by mouth daily.     buPROPion (WELLBUTRIN XL) 300 MG 24 hr tablet Take  1 tablet  Daily for Mood, Focus & Concentration 90 tablet 3   Calcium Carbonate-Vitamin D (OYSTER SHELL CALCIUM/D) 250-125 MG-UNIT TABS Take by mouth.     finasteride (PROSCAR) 5 MG tablet Take  1 tablet  Daily  for Prostate 90 tablet 1   gabapentin (NEURONTIN)  300 MG capsule TAKE ONE CAPSULE BY MOUTH THREE TIMES A DAY AS NEEDED FOR PAIN 270 capsule 1   glucose blood (FREESTYLE TEST STRIPS) test strip Check blood sugar 3 to 4 times daily for medication regulation. 450 each PRN   insulin NPH-regular Human (NOVOLIN 70/30) (70-30) 100 UNIT/ML injection Take 20-30 units 2 x day or as directed 10 mL 99   latanoprost (XALATAN) 0.005 % ophthalmic solution Place 1 drop into both eyes at bedtime.   4   magic mouthwash w/lidocaine SOLN Swish and spit 5 ml every 2 hours as needed. 240 mL 0   MAGNESIUM-OXIDE 400 (241.3 Mg) MG tablet Take 400 mg by mouth 2 (two) times daily.   6   mycophenolate (MYFORTIC) 360 MG TBEC EC tablet TAKE 1 TABLET BY MOUTH TWICE DAILY 180 tablet 1   Probiotic Product (PROBIOTIC DAILY PO) Take 1 capsule by mouth daily.     tacrolimus (PROGRAF) 0.5 MG capsule Take 0.5 mg by mouth 2 (two) times daily.     tacrolimus (PROGRAF) 1 MG capsule Take 1 mg by mouth 2 (two) times daily.     traZODone (DESYREL) 50 MG tablet Take  1 tablet  1 hour  before Bedtime  as needed for Sleep 90 tablet 3   VITAMIN D PO Take 5,000 Units by mouth. Takes two 5000 unit capsules on MWF and 1 capsule 4 days a week.     warfarin (COUMADIN) 5 MG tablet TAKE ONE TO TWO TABLETS BY MOUTH EVERY DAY OR AS DIRECTED 180 tablet 2   Zinc 50 MG CAPS Take 50 mg by mouth daily.     dexamethasone (DECADRON) 2 MG tablet Take 1 tab 3 x /day for 2 days,      then 2 x /day for 2  Days,     then 1 tab daily 13 tablet 0   No current facility-administered medications on file prior to visit.    Allergies: Allergies  Allergen Reactions   Losartan Anaphylaxis   Crestor [Rosuvastatin]     Elevated LFT's   Lorazepam     Pt is unsure of reaction    Morphine And Related Other (See Comments)    Has no effect on pt     Current Problems (verified) has Chronic systolic heart failure ([place]); CKD stage 2 due to type 2 diabetes mellitus ([place]); Hyperlipidemia associated with type 2 diabetes  mellitus ([place]); BPH with obstruction/lower urinary tract symptoms; Gout; PAF (paroxysmal atrial fibrillation) ([place]); Essential hypertension; Vitamin D deficiency; Anticoagulant long-term use; Renal Transplant, s/p 09/2011; Depression, major, in remission ([place]); Thrombocytopenia ([place]); CAD (coronary artery disease) of artery bypass graft; IgA nephropathy; Essential tremor; Overweight (BMI 25.0-29.9); Senile purpura ([place]); Type 2 diabetes mellitus with stage  2 chronic kidney disease, with long-term current use of insulin ([place]); Former smoker (quit 1992); HNP (herniated nucleus pulposus), cervical; S/P cervical spinal fusion; Chronic hip pain, bilateral; Hypercoagulopathy ([place]); Mobitz I; A-V fistula ([place]); First degree AV block; History of skin cancer; B12 deficiency; and Other abnormal glucose (prediabetes) on their problem list.   Surgical: He  has a past surgical history that includes Colonoscopy; Peritoneal catheter insertion (2011); AV fistula placement (2011); Kidney transplant (09/25/2011); Coronary angioplasty with stent; Coronary artery bypass graft (2013); and Anterior cervical decomp/discectomy fusion (N/A, 05/04/2018). Family His family history includes Alcohol abuse in his brother and maternal grandfather; Alcoholism in his brother; Alzheimer's disease in his brother; COPD in his brother and mother; Diabetes in his mother; Heart attack in his father; Kidney disease in his sister. Social history  He reports that he quit smoking about 29 years ago. His smoking use included cigarettes. He started smoking about 54 years ago. He has a 48.00 pack-year smoking history. He has never used smokeless tobacco. He reports current alcohol use. He reports that he does not use drugs.    Review of Systems  Constitutional:  Negative for malaise/fatigue and weight loss.  HENT:  Negative for hearing loss, sore throat and tinnitus.   Eyes:  Negative for blurred vision and double vision.  Respiratory:  Negative  for cough, sputum production, shortness of breath and wheezing.   Cardiovascular:  Negative for chest pain, palpitations, orthopnea, claudication, leg swelling and PND.  Gastrointestinal:  Negative for abdominal pain, blood in stool, constipation, diarrhea, heartburn, melena, nausea and vomiting.  Genitourinary: Negative.   Musculoskeletal:  Negative for falls, joint pain and myalgias.  Skin:  Negative for rash.  Neurological:  Negative for dizziness, tingling, sensory change, weakness and headaches.  Endo/Heme/Allergies:  Negative for polydipsia. Bruises/bleeds easily.  Psychiatric/Behavioral: Negative.  Negative for depression, memory loss, substance abuse and suicidal ideas. The patient is not nervous/anxious and does not have insomnia.   All other systems reviewed and are negative.    Objective:   Today's Vitals   10/30/20 0842  BP: 128/70  Pulse: 81  Temp: 97.9 F (36.6 C)  SpO2: 98%  Weight: 189 lb (85.7 kg)   Body mass index is 28.74 kg/m.  General Appearance: Well nourished, in no apparent distress. Eyes: PERRLA, EOMs, conjunctiva no swelling or erythema Sinuses: No Frontal/maxillary tenderness ENT/Mouth: Ext aud canals clear, TMs without erythema, bulging. No erythema, swelling, or exudate on post pharynx. Tonsils not swollen or erythematous. Bil hearing aids.  Neck: Supple, thyroid normal.  Respiratory: Respiratory effort normal, BS equal bilaterally without rales, rhonchi, wheezing or stridor.  Cardio: Irregularly irregular, 3/6 early systolic blowing murmur. Brisk LE pulses, without edema. Has AV fistula to left forearm with thrill. Sensation intact, cap refill brisk Abdomen: Soft, + BS.  Non tender, no guarding, rebound, hernias, masses. Lymphatics: Non tender without lymphadenopathy.  Musculoskeletal: Full ROM, 5/5 strength, normal gait.  Skin: Warm, dry without rashes; ecchymoses to bilateral upper extremities;  Neuro: Cranial nerves intact. Normal muscle tone, no  cerebellar symptoms.  Psych: Awake and oriented X 3, normal affect, Insight and Judgment appropriate.    Izora Ribas, NP   10/30/2020

## 2020-10-30 ENCOUNTER — Encounter: Payer: Self-pay | Admitting: Adult Health

## 2020-10-30 ENCOUNTER — Ambulatory Visit (INDEPENDENT_AMBULATORY_CARE_PROVIDER_SITE_OTHER): Payer: Medicare Other | Admitting: Adult Health

## 2020-10-30 ENCOUNTER — Other Ambulatory Visit: Payer: Self-pay

## 2020-10-30 VITALS — BP 128/70 | HR 81 | Temp 97.9°F | Wt 189.0 lb

## 2020-10-30 DIAGNOSIS — D696 Thrombocytopenia, unspecified: Secondary | ICD-10-CM | POA: Diagnosis not present

## 2020-10-30 DIAGNOSIS — I480 Paroxysmal atrial fibrillation: Secondary | ICD-10-CM | POA: Diagnosis not present

## 2020-10-30 DIAGNOSIS — D6859 Other primary thrombophilia: Secondary | ICD-10-CM | POA: Diagnosis not present

## 2020-10-30 DIAGNOSIS — I10 Essential (primary) hypertension: Secondary | ICD-10-CM | POA: Diagnosis not present

## 2020-10-30 DIAGNOSIS — I25708 Atherosclerosis of coronary artery bypass graft(s), unspecified, with other forms of angina pectoris: Secondary | ICD-10-CM

## 2020-10-30 DIAGNOSIS — E538 Deficiency of other specified B group vitamins: Secondary | ICD-10-CM

## 2020-10-31 LAB — CBC WITH DIFFERENTIAL/PLATELET
Absolute Monocytes: 464 cells/uL (ref 200–950)
Basophils Absolute: 41 cells/uL (ref 0–200)
Basophils Relative: 0.8 %
Eosinophils Absolute: 61 cells/uL (ref 15–500)
Eosinophils Relative: 1.2 %
HCT: 43.5 % (ref 38.5–50.0)
Hemoglobin: 14.8 g/dL (ref 13.2–17.1)
Lymphs Abs: 1168 cells/uL (ref 850–3900)
MCH: 31.6 pg (ref 27.0–33.0)
MCHC: 34 g/dL (ref 32.0–36.0)
MCV: 92.8 fL (ref 80.0–100.0)
MPV: 9.8 fL (ref 7.5–12.5)
Monocytes Relative: 9.1 %
Neutro Abs: 3366 cells/uL (ref 1500–7800)
Neutrophils Relative %: 66 %
Platelets: 136 10*3/uL — ABNORMAL LOW (ref 140–400)
RBC: 4.69 10*6/uL (ref 4.20–5.80)
RDW: 12.4 % (ref 11.0–15.0)
Total Lymphocyte: 22.9 %
WBC: 5.1 10*3/uL (ref 3.8–10.8)

## 2020-10-31 LAB — PROTIME-INR
INR: 2.1 — ABNORMAL HIGH
Prothrombin Time: 20 s — ABNORMAL HIGH (ref 9.0–11.5)

## 2020-10-31 LAB — VITAMIN B12: Vitamin B-12: 730 pg/mL (ref 200–1100)

## 2020-11-29 NOTE — Progress Notes (Signed)
AWV and 1 month FOLLOW UP Assessment:   Annual Medicare Wellness Visit Due annually  Health maintenance reviewed - bring covid 19 vaccine record - bring advanced directives, living will   Essential hypertension - continue medications, DASH diet, exercise and monitor at home. Call if greater than 130/80.  -     CBC with Differential/Platelet  PAF (paroxysmal atrial fibrillation) (HCC) Check INR q month and will adjust medication according to labs.  Discussed if patient falls to immediately contact office or go to ER. Discussed foods that can increase or decrease Coumadin levels. Patient understands to call the office before starting a new medication. Follow up in one month.   Chronic systolic heart failure ([place]) Improved on recent ECHO 04/2018 Follows with cardiology  Control blood pressure, cholesterol, glucose, increase exercise.   Weights are down, appears euvolemic  Thrombocytopenia (HCC) Check CBC at routine visits  Gout due to renal impairment, unspecified chronicity, unspecified site  recheck Uric acid as needed, Diet discussed, continue medications.  Vitamin D deficiency Continue supplement   Medication management Check routine labs   Long term current use of anticoagulant therapy Monitor INR  Benign prostatic hyperplasia without lower urinary tract symptoms Continue medications  Type 2 Diabetes Mellitus Education: Reviewed 'ABCs' of diabetes management (respective goals in parentheses):  A1C (<7), blood pressure (<130/80), and cholesterol (LDL <70) Eye Exam yearly and Dental Exam every 6 months - UTD  Dietary recommendations Physical Activity recommendations  CKD stage 2 due to type 2 diabetes mellitus ([place]) Discussed general issues about diabetes pathophysiology and management., Educational material distributed., Suggested low cholesterol diet., Encouraged aerobic exercise., Discussed foot care., Reminded to get yearly retinal exam.  Mild episode of  recurrent major depressive disorder, in remission ([place]) Continue medications; may try reducing wellbutrin dose, sleep hygiene reviewed, keep log Lifestyle discussed: diet/exerise, sleep hygiene, stress management, hydration   Overweight  - long discussion about weight loss, diet, and exercise -recommended diet heavy in fruits and veggies and low in animal meats, cheeses, and dairy products  Chronic bilateral hip pain  Improved; monitoring  Hx of renal transplant; AV fistula ([place]) S/p transplant in 2013, doing well since on on myfortic and prograf (follows with [facility]), Dr. Donnetta Hutching PRN for retained AV fistula   Senile purpura ([place]) R/t comadin, monitor, protect skin   History of skin cancer Unsure what type, non melanoma; protect skin, sunscreen encouraged; derm following    Orders Placed This Encounter  Procedures   Protime-INR   CBC with Differential/Platelet    Over 30 minutes of exam, counseling, chart review, and critical decision making was performed  Future Appointments  Date Time Provider [place]  01/01/2021  8:45 AM [facility] NURSE [facility] None  05/16/2021  2:00 PM Liane Comber, NP [facility] None     Plan:   During the course of the visit the patient was educated and counseled about appropriate screening and preventive services including:   Pneumococcal vaccine  Influenza vaccine Prevnar 13 Td vaccine Screening electrocardiogram Colorectal cancer screening Diabetes screening Glaucoma screening Nutrition counseling    Subjective:  Robert Arnold is a 75 y.o. male who presents for AWV and 1 month OV for coumadin.    He underwent C6-7 disc decompression and fusion in 04/2018 by Dr. Durene Cal and feels fully resolved without any issues.He has chronic bilateral hip pain but improves with steroid injection, recently improved.   He has history of depression, well controlled with wellbutrin 300 mg daily and trazodone 50 mg  at night. He  notes has had some more difficulty falling asleep, but does wake up feeling refreshed.   BMI is Body mass index is 28.74 kg/m., he has been working on diet, admits exercise has been limited with weather. Does have a recumbent bike, would like to work up to 30 min 5 days a week. Walks in park when weather is good.  He drinks 1-2 cups of coffee. Minimal alochol. Drinks lots of water. Stress levels are low.  Wt Readings from Last 3 Encounters:  11/30/20 189 lb (85.7 kg)  10/30/20 189 lb (85.7 kg)  10/04/20 186 lb 9.6 oz (84.6 kg)   He has p. A. Fib, on coumadin; hx of CAD s/p stenting 09/2011 and CABG 02/2012, has done well since. 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  He has hx of systolic CHF, improved/resolved on most recent ECHO 04/2018 however with diastolic dysfunction and LVEF 50-55%.  Follows with Dr. Acie Fredrickson.   He has history of Afib and is on coumadin, he is on 5 mg other days, within goal range; no nose bleeds, blood stool/urine. No missed doses, no ABX.  Lab Results  Component Value Date   INR 2.1 (H) 10/30/2020   INR 2.0 (H) 10/04/2020   INR 2.3 (H) 08/29/2020   His blood pressure has been controlled at home, today their BP is BP: 118/78 He does workout, walking 4 days/week. He denies chest pain, shortness of breath, dizziness.    He is on cholesterol medication (atorvastatin 40 mg daily) and denies myalgias. His cholesterol is at goal. The cholesterol last visit was:   Lab Results  Component Value Date   CHOL 147 05/16/2020   HDL 64 05/16/2020   LDLCALC 66 05/16/2020   TRIG 87 05/16/2020   CHOLHDL 2.3 05/16/2020    He has been working on diet and exercise for well controlled T2 diabetes on novolin 70/30 22 units AM, 12-15 units units at night, and denies increased appetite, nausea, paresthesia of the feet, polydipsia, polyuria and visual disturbances.  Reports fasting around 90-110. Does have occasional low glucose, always in the afternoon, none in several months.  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  On prograf and myfortic, follows [facility]  Now back in CKD 2 stage;  Lab Results  Component Value Date   GFRNONAA 65 05/16/2020   Patient is on Vitamin D supplement, alternates 10000 IU and 5000 IU every other day.  Lab Results  Component Value Date   VD25OH 80 07/19/2019     Patient is on allopurinol for gout and does not report a recent flare.  Lab Results  Component Value Date   LABURIC 3.3 (L) 05/16/2020   He notes stream is slow to start; denies nocturia, straining, dribbling. Declined med and monitoring for now.  Lab Results  Component Value Date   PSA <0.1 04/05/2019   PSA <0.1 03/03/2018   PSA <0.1 01/31/2017      Medication Review: Current Outpatient Medications on File Prior to Visit  Medication Sig Dispense Refill   Ascorbic Acid (VITAMIN C) 1000 MG tablet Take 1,000 mg by mouth daily.     aspirin EC 81 MG tablet Take 81 mg by mouth daily.     atorvastatin (LIPITOR) 40 MG tablet Take     1 tablet  Daily      for Cholesterol 90 tablet 0   B Complex-Biotin-FA (B COMPLETE) TABS Take 1 tablet by mouth daily.     Black Pepper-Turmeric (TURMERIC COMPLEX/BLACK PEPPER PO) Take 1,500 mg by mouth daily.     buPROPion (WELLBUTRIN XL) 300 MG 24 hr tablet Take  1 tablet  Daily for Mood, Focus & Concentration 90 tablet 3   Calcium Carbonate-Vitamin D (OYSTER SHELL CALCIUM/D) 250-125 MG-UNIT TABS Take by mouth.     finasteride (PROSCAR) 5 MG tablet Take  1 tablet  Daily  for Prostate 90 tablet 1   gabapentin (NEURONTIN) 300 MG capsule TAKE ONE CAPSULE BY MOUTH THREE TIMES A DAY AS NEEDED FOR PAIN 270 capsule 1   glucose blood (FREESTYLE TEST STRIPS) test strip Check blood sugar 3 to 4 times daily for medication regulation. 450 each PRN   insulin NPH-regular Human  (NOVOLIN 70/30) (70-30) 100 UNIT/ML injection Take 20-30 units 2 x day or as directed 10 mL 99   latanoprost (XALATAN) 0.005 % ophthalmic solution Place 1 drop into both eyes at bedtime.   4   magic mouthwash w/lidocaine SOLN Swish and spit 5 ml every 2 hours as needed. 240 mL 0   MAGNESIUM-OXIDE 400 (241.3 Mg) MG tablet Take 400 mg by mouth 2 (two) times daily.   6   mycophenolate (MYFORTIC) 360 MG TBEC EC tablet TAKE 1 TABLET BY MOUTH TWICE DAILY 180 tablet 1   Probiotic Product (PROBIOTIC DAILY PO) Take 1 capsule by mouth daily.     tacrolimus (PROGRAF) 0.5 MG capsule Take 0.5 mg by mouth 2 (two) times daily.     tacrolimus (PROGRAF) 1 MG capsule Take 1 mg by mouth 2 (two) times daily.     traZODone (DESYREL) 50 MG tablet Take  1 tablet  1 hour  before Bedtime  as needed for Sleep 90 tablet 3   VITAMIN D PO Take 5,000 Units by mouth. Takes two 5000 unit capsules on MWF and 1 capsule 4 days a week.     warfarin (COUMADIN) 5 MG tablet TAKE ONE TO TWO TABLETS BY MOUTH EVERY DAY OR AS DIRECTED 180 tablet 2   Zinc 50 MG CAPS Take 50 mg by mouth daily.     No current facility-administered medications on file prior to visit.    Allergies: Allergies  Allergen Reactions   Losartan Anaphylaxis   Crestor [Rosuvastatin]     Elevated LFT's   Lorazepam     Pt is unsure of reaction    Morphine And Related Other (See Comments)    Has no effect on pt     Current Problems (verified) has Chronic systolic heart failure ([place]); CKD stage 2 due to type 2 diabetes mellitus ([place]); Hyperlipidemia associated with type 2 diabetes mellitus ([place]); BPH with obstruction/lower urinary tract symptoms; Gout; PAF (paroxysmal atrial fibrillation) ([place]); Essential hypertension; Vitamin D deficiency; Anticoagulant long-term use; Renal Transplant, s/p 09/2011; Depression, major, in remission ([place]); Thrombocytopenia ([place]); CAD (coronary artery disease) of artery bypass graft; IgA nephropathy; Essential tremor; Overweight (BMI  25.0-29.9); Senile purpura ([place]); Type 2 diabetes mellitus with stage 2 chronic kidney disease, with long-term current use of insulin ([place]); Former smoker (quit 1992); HNP (herniated nucleus pulposus), cervical; S/P cervical spinal fusion; Chronic hip pain, bilateral; Hypercoagulopathy ([place]); Mobitz I; A-V fistula ([place]); First degree AV block; History of skin cancer; B12 deficiency; and Other abnormal glucose (prediabetes) on their problem list.  Screening Tests Immunization History  Administered Date(s) Administered  DT (Pediatric) 06/05/2015   Hepatitis B 04/08/2009   Influenza, High Dose Seasonal PF 12/27/2013, 01/17/2015, 01/11/2016, 01/02/2018, 12/21/2018, 01/31/2020   Influenza-Unspecified 12/25/2016   PFIZER(Purple Top)SARS-COV-2 Vaccination 07/24/2019, 08/17/2019   Pneumococcal Conjugate-13 10/25/2013   Pneumococcal Polysaccharide-23 06/05/2015   Pneumococcal-Unspecified 04/08/2008   Tdap 04/08/2008   Zoster Recombinat (Shingrix) 02/12/2018, 06/02/2018   Preventative care: Last colonoscopy: 03/2016, 5 year follow up, Dr. Havery Moros, patient has phone number to call  CT AB 2012 CT chest 2010 Sleep study 2013 Echo 02/2013, 0/9628, normal systolic, diastolic dysfunction, LVEF 50-55% Stress test 01/2012  CXR 10/2016  Prior vaccinations: TD or Tdap: 2017  Influenza: 2021 Pneumococcal: 2017 - TODAY, immunosuppressed Prevnar13: 2015 Shingles/Zostavax: had 2/2, shingrix  Covid 19: had 3/3, 2021, pfizer + 2 boosters - requested send information   Names of Other Physician/Practitioners you currently use: 1. [facility] here for primary care 2. Dr. Nicki Reaper, [facility], last visit 12/16/2019, DM no retinopathy, follows q59m due to glaucoma 3. Dr. Mirna Mires, dentist, last visit, 2021 q 6 months 4. [facility] dermatology, last 2021, has upcoming appointment in 1 month   Patient Care Team: [name], MD as PCP - General (Internal  Medicine) Nahser, Wonda Cheng, MD as PCP - Cardiology (Cardiology) Macarthur Critchley, [place] as Referring Physician (Optometry) Madelon Lips, MD as Consulting Physician (Nephrology) Armbruster, Carlota Raspberry, MD as Consulting Physician (Gastroenterology) Harriett Sine, MD as Consulting Physician (Dermatology)  Surgical: He  has a past surgical history that includes Colonoscopy; Peritoneal catheter insertion (2011); AV fistula placement (2011); Kidney transplant (09/25/2011); Coronary angioplasty with stent; Coronary artery bypass graft (2013); and Anterior cervical decomp/discectomy fusion (N/A, 05/04/2018). Family His family history includes Alcohol abuse in his brother and maternal grandfather; Alcoholism in his brother; Alzheimer's disease in his brother; COPD in his brother and mother; Diabetes in his mother; Heart attack in his father; Kidney disease in his sister. Social history  He reports that he quit smoking about 30 years ago. His smoking use included cigarettes. He started smoking about 54 years ago. He has a 48.00 pack-year smoking history. He has never used smokeless tobacco. He reports current alcohol use. He reports that he does not use drugs.  Depression/mood screen:   Depression screen [facility] 2/9 11/30/2020  Decreased Interest 0  Down, Depressed, Hopeless 0  PHQ - 2 Score 0  Altered sleeping 0  Tired, decreased energy 0  Change in appetite 0  Feeling bad or failure about yourself  0  Trouble concentrating 0  Moving slowly or fidgety/restless 0  Suicidal thoughts 0  PHQ-9 Score 0  Difficult doing work/chores -  Some recent data might be hidden     Review of Systems  Constitutional:  Negative for malaise/fatigue and weight loss.  HENT:  Negative for hearing loss and tinnitus.   Eyes:  Negative for blurred vision and double vision.  Respiratory:  Negative for cough, sputum production, shortness of breath and wheezing.   Cardiovascular:  Negative for chest pain, palpitations,  orthopnea, claudication, leg swelling and PND.  Gastrointestinal:  Negative for abdominal pain, blood in stool, constipation, diarrhea, heartburn, melena, nausea and vomiting.  Genitourinary: Negative.   Musculoskeletal:  Negative for falls, joint pain and myalgias.  Skin:  Negative for rash.  Neurological:  Negative for dizziness, tingling, sensory change, weakness and headaches.  Endo/Heme/Allergies:  Negative for polydipsia.  Psychiatric/Behavioral: Negative.  Negative for depression, memory loss, substance abuse and suicidal ideas. The patient is not nervous/anxious and does not have  insomnia.   All other systems reviewed and are negative.    Objective:   Today's Vitals   11/30/20 1108  BP: 118/78  Pulse: 72  Temp: (!) 97 F (36.1 C)  SpO2: 99%  Weight: 189 lb (85.7 kg)   Body mass index is 28.74 kg/m.  General Appearance: Well nourished, in no apparent distress. Eyes: PERRLA, EOMs, conjunctiva no swelling or erythema Sinuses: No Frontal/maxillary tenderness ENT/Mouth: Ext aud canals clear, TMs without erythema, bulging. No erythema, swelling, or exudate on post pharynx.  Tonsils not swollen or erythematous. Hearing normal.  Neck: Supple, thyroid normal.  Respiratory: Respiratory effort normal, BS equal bilaterally without rales, rhonchi, wheezing or stridor.  Cardio: Irregularly irregular, 3/6 early systolic blowing murmur. Brisk LE pulses, without edema. Has AV fistula to left forearm with thrill. Sensation intact, cap refill brisk Abdomen: Soft, + BS.  Non tender, no guarding, rebound, hernias, masses. Lymphatics: Non tender without lymphadenopathy.  Musculoskeletal: Full ROM, 5/5 strength, normal gait. Excepting right wrist in splint; distal sensation intact.  Skin: Warm, dry without rashes; small ecchymoses to bilateral upper extremities; he has scaly areas with erythematous base to forehead, left cheek x 2.  Neuro: Cranial nerves intact. Normal muscle tone, no  cerebellar symptoms.  Psych: Awake and oriented X 3, normal affect, Insight and Judgment appropriate.  GU: doing regular self exams, denies concerns, declines  EKG: defer - just had by cardiology 05/12/2020   The patient's weight, height, BMI have been recorded in the chart.  I have made referrals, counseling, and provided education to the patient based on review of the above and I have provided the patient with a written personalized care plan for preventive services.     Izora Ribas, NP   11/30/2020

## 2020-11-30 ENCOUNTER — Other Ambulatory Visit: Payer: Self-pay

## 2020-11-30 ENCOUNTER — Ambulatory Visit (INDEPENDENT_AMBULATORY_CARE_PROVIDER_SITE_OTHER): Payer: Medicare Other | Admitting: Adult Health

## 2020-11-30 ENCOUNTER — Ambulatory Visit: Payer: Medicare Other

## 2020-11-30 ENCOUNTER — Encounter: Payer: Self-pay | Admitting: Adult Health

## 2020-11-30 VITALS — BP 118/78 | HR 72 | Temp 97.0°F | Wt 189.0 lb

## 2020-11-30 DIAGNOSIS — E785 Hyperlipidemia, unspecified: Secondary | ICD-10-CM

## 2020-11-30 DIAGNOSIS — I480 Paroxysmal atrial fibrillation: Secondary | ICD-10-CM | POA: Diagnosis not present

## 2020-11-30 DIAGNOSIS — I10 Essential (primary) hypertension: Secondary | ICD-10-CM

## 2020-11-30 DIAGNOSIS — D6859 Other primary thrombophilia: Secondary | ICD-10-CM

## 2020-11-30 DIAGNOSIS — Z794 Long term (current) use of insulin: Secondary | ICD-10-CM

## 2020-11-30 DIAGNOSIS — E1169 Type 2 diabetes mellitus with other specified complication: Secondary | ICD-10-CM

## 2020-11-30 DIAGNOSIS — R6889 Other general symptoms and signs: Secondary | ICD-10-CM | POA: Diagnosis not present

## 2020-11-30 DIAGNOSIS — R7309 Other abnormal glucose: Secondary | ICD-10-CM

## 2020-11-30 DIAGNOSIS — F325 Major depressive disorder, single episode, in full remission: Secondary | ICD-10-CM

## 2020-11-30 DIAGNOSIS — I440 Atrioventricular block, first degree: Secondary | ICD-10-CM

## 2020-11-30 DIAGNOSIS — I5022 Chronic systolic (congestive) heart failure: Secondary | ICD-10-CM | POA: Diagnosis not present

## 2020-11-30 DIAGNOSIS — Z0001 Encounter for general adult medical examination with abnormal findings: Secondary | ICD-10-CM | POA: Diagnosis not present

## 2020-11-30 DIAGNOSIS — Z85828 Personal history of other malignant neoplasm of skin: Secondary | ICD-10-CM

## 2020-11-30 DIAGNOSIS — N182 Chronic kidney disease, stage 2 (mild): Secondary | ICD-10-CM

## 2020-11-30 DIAGNOSIS — Z7901 Long term (current) use of anticoagulants: Secondary | ICD-10-CM

## 2020-11-30 DIAGNOSIS — M1A3790 Chronic gout due to renal impairment, unspecified ankle and foot, without tophus (tophi): Secondary | ICD-10-CM

## 2020-11-30 DIAGNOSIS — I441 Atrioventricular block, second degree: Secondary | ICD-10-CM

## 2020-11-30 DIAGNOSIS — Z23 Encounter for immunization: Secondary | ICD-10-CM | POA: Diagnosis not present

## 2020-11-30 DIAGNOSIS — G250 Essential tremor: Secondary | ICD-10-CM

## 2020-11-30 DIAGNOSIS — E663 Overweight: Secondary | ICD-10-CM

## 2020-11-30 DIAGNOSIS — Z87891 Personal history of nicotine dependence: Secondary | ICD-10-CM

## 2020-11-30 DIAGNOSIS — D696 Thrombocytopenia, unspecified: Secondary | ICD-10-CM | POA: Diagnosis not present

## 2020-11-30 DIAGNOSIS — Z0000 Encounter for general adult medical examination without abnormal findings: Secondary | ICD-10-CM

## 2020-11-30 DIAGNOSIS — E559 Vitamin D deficiency, unspecified: Secondary | ICD-10-CM

## 2020-11-30 DIAGNOSIS — N401 Enlarged prostate with lower urinary tract symptoms: Secondary | ICD-10-CM

## 2020-11-30 DIAGNOSIS — M1000 Idiopathic gout, unspecified site: Secondary | ICD-10-CM

## 2020-11-30 DIAGNOSIS — Z940 Kidney transplant status: Secondary | ICD-10-CM

## 2020-11-30 DIAGNOSIS — N028 Recurrent and persistent hematuria with other morphologic changes: Secondary | ICD-10-CM

## 2020-11-30 DIAGNOSIS — E538 Deficiency of other specified B group vitamins: Secondary | ICD-10-CM

## 2020-11-30 DIAGNOSIS — I770 Arteriovenous fistula, acquired: Secondary | ICD-10-CM

## 2020-11-30 DIAGNOSIS — E1122 Type 2 diabetes mellitus with diabetic chronic kidney disease: Secondary | ICD-10-CM

## 2020-11-30 DIAGNOSIS — N138 Other obstructive and reflux uropathy: Secondary | ICD-10-CM

## 2020-11-30 DIAGNOSIS — I25708 Atherosclerosis of coronary artery bypass graft(s), unspecified, with other forms of angina pectoris: Secondary | ICD-10-CM

## 2020-11-30 DIAGNOSIS — D692 Other nonthrombocytopenic purpura: Secondary | ICD-10-CM

## 2020-11-30 MED ORDER — ALLOPURINOL 300 MG PO TABS: ORAL_TABLET | ORAL | 3 refills | Status: DC

## 2020-11-30 NOTE — Addendum Note (Signed)
Addended by: Chancy Hurter on: 11/30/2020 12:29 PM   Modules accepted: Orders

## 2020-11-30 NOTE — Patient Instructions (Signed)
Please bring advanced directive/living will paperwork  Please send covid 19 vaccine dates  Change positions slowly when getting out of bed  Keep log of sleep - what time going to bed, time waking up, if feeling refreshed in the morning before or after wellbutrin - we might try reducing the wellbutrin dose at some point to see if this helps  Try to wake up at consistent time in AM Can try melatonin 5-15 mg at night as needed     Also here is some information about good sleep hygiene.   Insomnia Insomnia is frequent trouble falling and/or staying asleep. Insomnia can be a long term problem or a short term problem. Both are common. Insomnia can be a short term problem when the wakefulness is related to a certain stress or worry. Long term insomnia is often related to ongoing stress during waking hours and/or poor sleeping habits. Overtime, sleep deprivation itself can make the problem worse. Every little thing feels more severe because you are overtired and your ability to cope is decreased. CAUSES  Stress, anxiety, and depression. Poor sleeping habits. Distractions such as TV in the bedroom. Naps close to bedtime. Engaging in emotionally charged conversations before bed. Technical reading before sleep. Alcohol and other sedatives. They may make the problem worse. They can hurt normal sleep patterns and normal dream activity. Stimulants such as caffeine for several hours prior to bedtime. Pain syndromes and shortness of breath can cause insomnia. Exercise late at night. Changing time zones may cause sleeping problems (jet lag). It is sometimes helpful to have someone observe your sleeping patterns. They should look for periods of not breathing during the night (sleep apnea). They should also look to see how long those periods last. If you live alone or observers are uncertain, you can also be observed at a sleep clinic where your sleep patterns will be professionally monitored. Sleep  apnea requires a checkup and treatment. Give your caregivers your medical history. Give your caregivers observations your family has made about your sleep.  SYMPTOMS  Not feeling rested in the morning. Anxiety and restlessness at bedtime. Difficulty falling and staying asleep. TREATMENT  Your caregiver may prescribe treatment for an underlying medical disorders. Your caregiver can give advice or help if you are using alcohol or other drugs for self-medication. Treatment of underlying problems will usually eliminate insomnia problems. Medications can be prescribed for short time use. They are generally not recommended for lengthy use. Over-the-counter sleep medicines are not recommended for lengthy use. They can be habit forming. You can promote easier sleeping by making lifestyle changes such as: Using relaxation techniques that help with breathing and reduce muscle tension. Exercising earlier in the day. Changing your diet and the time of your last meal. No night time snacks. Establish a regular time to go to bed. Counseling can help with stressful problems and worry. Soothing music and white noise may be helpful if there are background noises you cannot remove. Stop tedious detailed work at least one hour before bedtime. HOME CARE INSTRUCTIONS  Keep a diary. Inform your caregiver about your progress. This includes any medication side effects. See your caregiver regularly. Take note of: Times when you are asleep. Times when you are awake during the night. The quality of your sleep. How you feel the next day. This information will help your caregiver care for you. Get out of bed if you are still awake after 15 minutes. Read or do some quiet activity. Keep the lights down. Wait  until you feel sleepy and go back to bed. Keep regular sleeping and waking hours. Avoid naps. Exercise regularly. Avoid distractions at bedtime. Distractions include watching television or engaging in any intense or  detailed activity like attempting to balance the household checkbook. Develop a bedtime ritual. Keep a familiar routine of bathing, brushing your teeth, climbing into bed at the same time each night, listening to soothing music. Routines increase the success of falling to sleep faster. Use relaxation techniques. This can be using breathing and muscle tension release routines. It can also include visualizing peaceful scenes. You can also help control troubling or intruding thoughts by keeping your mind occupied with boring or repetitive thoughts like the old concept of counting sheep. You can make it more creative like imagining planting one beautiful flower after another in your backyard garden. During your day, work to eliminate stress. When this is not possible use some of the previous suggestions to help reduce the anxiety that accompanies stressful situations. MAKE SURE YOU:  Understand these instructions. Will watch your condition. Will get help right away if you are not doing well or get worse. Document Released: 03/22/2000 Document Revised: 06/17/2011 Document Reviewed: 04/22/2007 [facility] Patient Information 2015 [place], [place]. This information is not intended to replace advice given to you by your health care provider. Make sure you discuss any questions you have with your health care provider.

## 2020-12-01 ENCOUNTER — Inpatient Hospital Stay (HOSPITAL_BASED_OUTPATIENT_CLINIC_OR_DEPARTMENT_OTHER)
Admission: EM | Admit: 2020-12-01 | Discharge: 2020-12-05 | DRG: 372 | Disposition: A | Discharge status: Home / Self Care | Payer: Medicare Other | Attending: Family Medicine | Admitting: Family Medicine

## 2020-12-01 ENCOUNTER — Encounter (HOSPITAL_BASED_OUTPATIENT_CLINIC_OR_DEPARTMENT_OTHER): Payer: Self-pay

## 2020-12-01 ENCOUNTER — Other Ambulatory Visit: Payer: Self-pay

## 2020-12-01 DIAGNOSIS — Z8249 Family history of ischemic heart disease and other diseases of the circulatory system: Secondary | ICD-10-CM

## 2020-12-01 DIAGNOSIS — E1122 Type 2 diabetes mellitus with diabetic chronic kidney disease: Secondary | ICD-10-CM | POA: Diagnosis present

## 2020-12-01 DIAGNOSIS — Z825 Family history of asthma and other chronic lower respiratory diseases: Secondary | ICD-10-CM

## 2020-12-01 DIAGNOSIS — Z955 Presence of coronary angioplasty implant and graft: Secondary | ICD-10-CM

## 2020-12-01 DIAGNOSIS — E1169 Type 2 diabetes mellitus with other specified complication: Secondary | ICD-10-CM | POA: Diagnosis present

## 2020-12-01 DIAGNOSIS — Z87891 Personal history of nicotine dependence: Secondary | ICD-10-CM

## 2020-12-01 DIAGNOSIS — I452 Bifascicular block: Secondary | ICD-10-CM | POA: Diagnosis present

## 2020-12-01 DIAGNOSIS — Z20822 Contact with and (suspected) exposure to covid-19: Secondary | ICD-10-CM | POA: Diagnosis present

## 2020-12-01 DIAGNOSIS — Z7982 Long term (current) use of aspirin: Secondary | ICD-10-CM

## 2020-12-01 DIAGNOSIS — F325 Major depressive disorder, single episode, in full remission: Secondary | ICD-10-CM | POA: Diagnosis present

## 2020-12-01 DIAGNOSIS — K5730 Diverticulosis of large intestine without perforation or abscess without bleeding: Secondary | ICD-10-CM | POA: Diagnosis present

## 2020-12-01 DIAGNOSIS — K353 Acute appendicitis with localized peritonitis, without perforation or gangrene: Principal | ICD-10-CM | POA: Diagnosis present

## 2020-12-01 DIAGNOSIS — Z885 Allergy status to narcotic agent status: Secondary | ICD-10-CM

## 2020-12-01 DIAGNOSIS — N400 Enlarged prostate without lower urinary tract symptoms: Secondary | ICD-10-CM | POA: Diagnosis present

## 2020-12-01 DIAGNOSIS — Z951 Presence of aortocoronary bypass graft: Secondary | ICD-10-CM

## 2020-12-01 DIAGNOSIS — G4733 Obstructive sleep apnea (adult) (pediatric): Secondary | ICD-10-CM | POA: Diagnosis present

## 2020-12-01 DIAGNOSIS — M109 Gout, unspecified: Secondary | ICD-10-CM | POA: Diagnosis present

## 2020-12-01 DIAGNOSIS — K3580 Unspecified acute appendicitis: Secondary | ICD-10-CM | POA: Diagnosis present

## 2020-12-01 DIAGNOSIS — I4891 Unspecified atrial fibrillation: Secondary | ICD-10-CM | POA: Diagnosis present

## 2020-12-01 DIAGNOSIS — Z820 Family history of epilepsy and other diseases of the nervous system: Secondary | ICD-10-CM

## 2020-12-01 DIAGNOSIS — N182 Chronic kidney disease, stage 2 (mild): Secondary | ICD-10-CM | POA: Diagnosis present

## 2020-12-01 DIAGNOSIS — Z833 Family history of diabetes mellitus: Secondary | ICD-10-CM

## 2020-12-01 DIAGNOSIS — I2510 Atherosclerotic heart disease of native coronary artery without angina pectoris: Secondary | ICD-10-CM | POA: Diagnosis present

## 2020-12-01 DIAGNOSIS — Z7901 Long term (current) use of anticoagulants: Secondary | ICD-10-CM

## 2020-12-01 DIAGNOSIS — I441 Atrioventricular block, second degree: Secondary | ICD-10-CM | POA: Diagnosis not present

## 2020-12-01 DIAGNOSIS — Z940 Kidney transplant status: Secondary | ICD-10-CM

## 2020-12-01 DIAGNOSIS — Z992 Dependence on renal dialysis: Secondary | ICD-10-CM

## 2020-12-01 DIAGNOSIS — Z981 Arthrodesis status: Secondary | ICD-10-CM

## 2020-12-01 DIAGNOSIS — Z888 Allergy status to other drugs, medicaments and biological substances status: Secondary | ICD-10-CM

## 2020-12-01 DIAGNOSIS — I252 Old myocardial infarction: Secondary | ICD-10-CM

## 2020-12-01 DIAGNOSIS — Z66 Do not resuscitate: Secondary | ICD-10-CM | POA: Diagnosis present

## 2020-12-01 DIAGNOSIS — Z811 Family history of alcohol abuse and dependence: Secondary | ICD-10-CM

## 2020-12-01 DIAGNOSIS — I129 Hypertensive chronic kidney disease with stage 1 through stage 4 chronic kidney disease, or unspecified chronic kidney disease: Secondary | ICD-10-CM | POA: Diagnosis present

## 2020-12-01 DIAGNOSIS — I480 Paroxysmal atrial fibrillation: Secondary | ICD-10-CM | POA: Diagnosis present

## 2020-12-01 DIAGNOSIS — Z794 Long term (current) use of insulin: Secondary | ICD-10-CM

## 2020-12-01 DIAGNOSIS — Z6372 Alcoholism and drug addiction in family: Secondary | ICD-10-CM

## 2020-12-01 DIAGNOSIS — I10 Essential (primary) hypertension: Secondary | ICD-10-CM | POA: Diagnosis present

## 2020-12-01 DIAGNOSIS — Z841 Family history of disorders of kidney and ureter: Secondary | ICD-10-CM

## 2020-12-01 DIAGNOSIS — E876 Hypokalemia: Secondary | ICD-10-CM | POA: Diagnosis not present

## 2020-12-01 DIAGNOSIS — E785 Hyperlipidemia, unspecified: Secondary | ICD-10-CM | POA: Diagnosis present

## 2020-12-01 DIAGNOSIS — Z79899 Other long term (current) drug therapy: Secondary | ICD-10-CM

## 2020-12-01 LAB — CBC
HCT: 43.8 % (ref 39.0–52.0)
Hemoglobin: 14.7 g/dL (ref 13.0–17.0)
MCH: 31.6 pg (ref 26.0–34.0)
MCHC: 33.6 g/dL (ref 30.0–36.0)
MCV: 94.2 fL (ref 80.0–100.0)
Platelets: 136 10*3/uL — ABNORMAL LOW (ref 150–400)
RBC: 4.65 MIL/uL (ref 4.22–5.81)
RDW: 12.8 % (ref 11.5–15.5)
WBC: 11.2 10*3/uL — ABNORMAL HIGH (ref 4.0–10.5)
nRBC: 0 % (ref 0.0–0.2)

## 2020-12-01 LAB — CBC WITH DIFFERENTIAL/PLATELET
Absolute Monocytes: 452 cells/uL (ref 200–950)
Basophils Absolute: 29 cells/uL (ref 0–200)
Basophils Relative: 0.5 %
Eosinophils Absolute: 81 cells/uL (ref 15–500)
Eosinophils Relative: 1.4 %
HCT: 46.5 % (ref 38.5–50.0)
Hemoglobin: 15.5 g/dL (ref 13.2–17.1)
Lymphs Abs: 1357 cells/uL (ref 850–3900)
MCH: 31.2 pg (ref 27.0–33.0)
MCHC: 33.3 g/dL (ref 32.0–36.0)
MCV: 93.6 fL (ref 80.0–100.0)
MPV: 10 fL (ref 7.5–12.5)
Monocytes Relative: 7.8 %
Neutro Abs: 3880 cells/uL (ref 1500–7800)
Neutrophils Relative %: 66.9 %
Platelets: 140 10*3/uL (ref 140–400)
RBC: 4.97 10*6/uL (ref 4.20–5.80)
RDW: 12.3 % (ref 11.0–15.0)
Total Lymphocyte: 23.4 %
WBC: 5.8 10*3/uL (ref 3.8–10.8)

## 2020-12-01 LAB — COMPREHENSIVE METABOLIC PANEL
ALT: 12 U/L (ref 0–44)
AST: 20 U/L (ref 15–41)
Albumin: 4.1 g/dL (ref 3.5–5.0)
Alkaline Phosphatase: 86 U/L (ref 38–126)
Anion gap: 12 (ref 5–15)
BUN: 22 mg/dL (ref 8–23)
CO2: 24 mmol/L (ref 22–32)
Calcium: 9.5 mg/dL (ref 8.9–10.3)
Chloride: 100 mmol/L (ref 98–111)
Creatinine, Ser: 0.97 mg/dL (ref 0.61–1.24)
GFR, Estimated: >60 mL/min (ref >60)
Glucose, Bld: 120 mg/dL — ABNORMAL HIGH (ref 70–99)
Potassium: 4.2 mmol/L (ref 3.5–5.1)
Sodium: 136 mmol/L (ref 135–145)
Total Bilirubin: 3.3 mg/dL — ABNORMAL HIGH (ref 0.3–1.2)
Total Protein: 6.7 g/dL (ref 6.5–8.1)

## 2020-12-01 LAB — PROTIME-INR
INR: 2.1 — ABNORMAL HIGH
INR: 1.7 — ABNORMAL HIGH (ref 0.8–1.2)
Prothrombin Time: 20.6 s — ABNORMAL HIGH (ref 9.0–11.5)
Prothrombin Time: 19.8 seconds — ABNORMAL HIGH (ref 11.4–15.2)

## 2020-12-01 LAB — LIPASE, BLOOD: Lipase: <10 U/L — ABNORMAL LOW (ref 11–51)

## 2020-12-01 MED ORDER — SODIUM CHLORIDE 0.9 % IV SOLN: Freq: Once | INTRAVENOUS | Status: AC

## 2020-12-01 MED ORDER — ONDANSETRON HCL 4 MG/2ML IJ SOLN
4.0000 mg | Freq: Once | INTRAMUSCULAR | Status: AC
  Administered 2020-12-01: 4 mg via INTRAVENOUS
  Filled 2020-12-01: qty 2

## 2020-12-01 MED ORDER — IOHEXOL 9 MG/ML PO SOLN
500.0000 mL | ORAL | Status: AC
  Administered 2020-12-01 (×2): 500 mL via ORAL

## 2020-12-01 MED ORDER — FENTANYL CITRATE (PF) 100 MCG/2ML IJ SOLN
50.0000 ug | Freq: Once | INTRAMUSCULAR | Status: AC
Start: 2020-12-01 — End: 2020-12-01
  Administered 2020-12-01: 50 ug via INTRAVENOUS
  Filled 2020-12-01: qty 2

## 2020-12-01 NOTE — ED Triage Notes (Addendum)
Pt is present for dull, lower abd pain with nausea that started this morning and it has not eased up throughout the day. BM last night. Denies UA sx and has been able to empty bladder. Pt states he has never had this pain before.

## 2020-12-01 NOTE — ED Notes (Signed)
Patient to rest room  Unable to provide urine sample

## 2020-12-01 NOTE — ED Provider Notes (Signed)
[facility] Provider Note: Georgena Spurling, MD, FACEP  CSN: [id number] MRN: [id number] ARRIVAL: 12/01/20 at [place]: [id number]   CHIEF COMPLAINT  Abdominal Pain   HISTORY OF PRESENT ILLNESS  12/01/20 10:41 PM Robert Arnold is a 75 y.o. male who is status post kidney transplant 2013.  He is here with abdominal pain that began this morning.  The pain is located in his lower abdomen.  It is present in his suprapubic region over to his left lower quadrant.  He describes it as dull and a pain he has never had before.  He rates it as an 8 out of 10 and it is worse with movement or palpation.  He had some diarrhea yesterday evening but has not had a bowel movement today.  He denies any urinary symptoms.  He denies fever.  He has had some nausea but no vomiting.  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 Off coumadin due to ureteral bleed   Testosterone deficiency 02/27/2018    Past Surgical History:  Procedure Laterality Date   ANTERIOR CERVICAL DECOMP/DISCECTOMY FUSION N/A 05/04/2018   Procedure: Cervical Five-Six Anterior cervical decompression/discectomy/fusion;  Surgeon: Jovita Gamma, MD;  Location: [facility];  Service: Neurosurgery;  Laterality: N/A;  Cervical Five-Six Anterior cervical decompression/discectomy/fusion   AV  FISTULA PLACEMENT  2011   Left forearm   COLONOSCOPY     CORONARY ANGIOPLASTY WITH STENT PLACEMENT     CORONARY ARTERY BYPASS GRAFT  2013   KIDNEY TRANSPLANT  09/25/2011   PERITONEAL CATHETER INSERTION  2011    Family History  Problem Relation Age of Onset   Heart attack Father        Unsure    Diabetes Mother    COPD Mother    Kidney disease Sister        no longer speak   Alcoholism Brother    Alzheimer's disease Brother    Alcohol abuse Maternal Grandfather    Alcohol abuse Brother    COPD Brother    Colon cancer Neg Hx    Stomach cancer Neg Hx    Rectal cancer Neg Hx    Esophageal cancer Neg Hx    Liver cancer Neg Hx     Social History   Tobacco Use   Smoking status: Former    Packs/day: 2.00    Years: 24.00    Pack years: 48.00    Types: Cigarettes    Start date: 1968    Quit date: 11/29/1990    Years since quitting: 30.0   Smokeless tobacco: Never  Vaping Use   Vaping Use: Never used  Substance Use Topics   Alcohol use: Yes    Comment: 1-2 a month   Drug  use: No    Prior to Admission medications   Medication Sig Start Date End Date Taking? Authorizing Provider  allopurinol (ZYLOPRIM) 300 MG tablet Take 1/2 tablet Daily  to Prevent  Gout 11/30/20   Liane Comber, NP  Ascorbic Acid (VITAMIN C) 1000 MG tablet Take 1,000 mg by mouth daily.    [provider]  aspirin EC 81 MG tablet Take 81 mg by mouth daily.    [provider]  atorvastatin (LIPITOR) 40 MG tablet Take     1 tablet     Daily      for Cholesterol 09/13/20   [name], MD  B Complex-Biotin-FA (B COMPLETE) TABS Take 1 tablet by mouth daily.    [provider]  Black Pepper-Turmeric (TURMERIC COMPLEX/BLACK PEPPER PO) Take 1,500 mg by mouth daily.    [provider]  buPROPion (WELLBUTRIN XL) 300 MG 24 hr tablet Take  1 tablet  Daily for Mood, Focus & Concentration 09/20/20   [name], MD  Calcium Carbonate-Vitamin D (OYSTER SHELL CALCIUM/D)  250-125 MG-UNIT TABS Take by mouth.    [provider]  finasteride (PROSCAR) 5 MG tablet Take  1 tablet  Daily  for Prostate 06/28/20   [name], MD  gabapentin (NEURONTIN) 300 MG capsule TAKE ONE CAPSULE BY MOUTH THREE TIMES A DAY AS NEEDED FOR PAIN 06/19/20   Liane Comber, NP  glucose blood (FREESTYLE TEST STRIPS) test strip Check blood sugar 3 to 4 times daily for medication regulation. 12/04/15   [name], MD  insulin NPH-regular Human (NOVOLIN 70/30) (70-30) 100 UNIT/ML injection Take 20-30 units 2 x day or as directed 02/12/14   [name], MD  latanoprost (XALATAN) 0.005 % ophthalmic solution Place 1 drop into both eyes at bedtime.  04/11/16   [provider]  magic mouthwash w/lidocaine SOLN Swish and spit 5 ml every 2 hours as needed. 07/26/20   Liane Comber, NP  MAGNESIUM-OXIDE 400 (241.3 Mg) MG tablet Take 400 mg by mouth 2 (two) times daily.  12/29/15   [provider]  mycophenolate (MYFORTIC) 360 MG TBEC EC tablet TAKE 1 TABLET BY MOUTH TWICE DAILY 05/06/16   [name], MD  Probiotic Product (PROBIOTIC DAILY PO) Take 1 capsule by mouth daily.    [provider]  tacrolimus (PROGRAF) 0.5 MG capsule Take 0.5 mg by mouth 2 (two) times daily.    [provider]  tacrolimus (PROGRAF) 1 MG capsule Take 1 mg by mouth 2 (two) times daily.    [provider]  traZODone (DESYREL) 50 MG tablet Take  1 tablet  1 hour  before Bedtime  as needed for Sleep 07/19/20   [name], MD  VITAMIN D PO Take 5,000 Units by mouth. Takes two 5000 unit capsules on MWF and 1 capsule 4 days a week.    [provider]  warfarin (COUMADIN) 5 MG tablet TAKE ONE TO TWO TABLETS BY MOUTH EVERY DAY OR AS DIRECTED 07/27/20   Liane Comber, NP  Zinc 50 MG CAPS Take 50 mg by mouth daily.    [provider]    Allergies Losartan, Crestor [rosuvastatin], Lorazepam, and Morphine and related   REVIEW OF SYSTEMS   Negative except as noted here or in the History of Present Illness.   PHYSICAL EXAMINATION  Initial Vital Signs Blood pressure (!) 150/76, pulse 83, temperature 98.7 F (37.1 C), resp. rate 18, SpO2 99 %.  Examination General: Well-developed, well-nourished male in no acute distress; appearance  consistent with age of record HENT: normocephalic; atraumatic Eyes: pupils equal, round and reactive to light; extraocular muscles intact Neck: supple Heart: regular rate and rhythm Lungs: clear to auscultation bilaterally Abdomen: soft; nondistended; suprapubic and left lower quadrant tenderness; bowel sounds hypoactive Extremities: No deformity; full range of motion; pulses normal Neurologic: Awake, alert and oriented; motor function intact in all extremities and symmetric; no facial droop Skin: Warm and dry Psychiatric: Normal mood and affect   RESULTS  Summary of this visit's results, reviewed and interpreted by myself:   EKG Interpretation  Date/Time:    Ventricular Rate:    PR Interval:    QRS Duration:   QT Interval:    QTC Calculation:   R Axis:     Text Interpretation:         Laboratory Studies: Results for orders placed or performed during the hospital encounter of 12/01/20 (from the past 24 hour(s))  Lipase, blood     Status: Abnormal   Collection Time: 12/01/20  7:21 PM  Result Value Ref Range   Lipase <10 (L) 11 - 51 U/L  Comprehensive metabolic panel     Status: Abnormal   Collection Time: 12/01/20  7:21 PM  Result Value Ref Range   Sodium 136 135 - 145 mmol/L   Potassium 4.2 3.5 - 5.1 mmol/L   Chloride 100 98 - 111 mmol/L   CO2 24 22 - 32 mmol/L   Glucose, Bld 120 (H) 70 - 99 mg/dL   BUN 22 8 - 23 mg/dL   Creatinine, Ser 0.97 0.61 - 1.24 mg/dL   Calcium 9.5 8.9 - 10.3 mg/dL   Total Protein 6.7 6.5 - 8.1 g/dL   Albumin 4.1 3.5 - 5.0 g/dL   AST 20 15 - 41 U/L   ALT 12 0 - 44 U/L   Alkaline Phosphatase 86 38 - 126 U/L   Total Bilirubin 3.3 (H) 0.3 -  1.2 mg/dL   GFR, Estimated >60 >60 mL/min   Anion gap 12 5 - 15  CBC     Status: Abnormal   Collection Time: 12/01/20  7:21 PM  Result Value Ref Range   WBC 11.2 (H) 4.0 - 10.5 K/uL   RBC 4.65 4.22 - 5.81 MIL/uL   Hemoglobin 14.7 13.0 - 17.0 g/dL   HCT 43.8 39.0 - 52.0 %   MCV 94.2 80.0 - 100.0 fL   MCH 31.6 26.0 - 34.0 pg   MCHC 33.6 30.0 - 36.0 g/dL   RDW 12.8 11.5 - 15.5 %   Platelets 136 (L) 150 - 400 K/uL   nRBC 0.0 0.0 - 0.2 %  Protime-INR     Status: Abnormal   Collection Time: 12/01/20 11:06 PM  Result Value Ref Range   Prothrombin Time 19.8 (H) 11.4 - 15.2 seconds   INR 1.7 (H) 0.8 - 1.2  Urinalysis, Routine w reflex microscopic Urine, Clean Catch     Status: Abnormal   Collection Time: 12/02/20  1:00 AM  Result Value Ref Range   Color, Urine YELLOW YELLOW   APPearance CLEAR CLEAR   Specific Gravity, Urine 1.029 1.005 - 1.030   pH 5.5 5.0 - 8.0   Glucose, UA NEGATIVE NEGATIVE mg/dL   Hgb urine dipstick NEGATIVE NEGATIVE   Bilirubin Urine NEGATIVE NEGATIVE   Ketones, ur 40 (A) NEGATIVE mg/dL   Protein, ur TRACE (A) NEGATIVE mg/dL   Nitrite NEGATIVE NEGATIVE   Leukocytes,Ua NEGATIVE NEGATIVE   Imaging Studies: CT ABDOMEN PELVIS W CONTRAST  Result  Date: 12/02/2020 CLINICAL DATA:  Abdominal pain.  Concern for diverticulitis. EXAM: CT ABDOMEN AND PELVIS WITH CONTRAST TECHNIQUE: Multidetector CT imaging of the abdomen and pelvis was performed using the standard protocol following bolus administration of intravenous contrast. CONTRAST:  75mL OMNIPAQUE IOHEXOL 350 MG/ML SOLN COMPARISON:  CT abdomen pelvis dated 12/11/2010. FINDINGS: Lower chest: The visualized lung bases are clear. There is mild cardiomegaly. Coronary vascular calcifications. No intra-abdominal free air or free fluid. Hepatobiliary: Fatty liver. No intrahepatic biliary ductal dilatation. Layering small stones within the gallbladder. No pericholecystic fluid. Pancreas: Unremarkable. No pancreatic ductal  dilatation or surrounding inflammatory changes. Spleen: Small scattered calcified splenic granuloma. Adrenals/Urinary Tract: The adrenal glands unremarkable. The kidneys are atrophic. Multiple bilateral renal cysts measuring up to 4 cm in the interpolar right kidney. There is a right lower quadrant renal transplant. There is no hydronephrosis of the transplant kidney. No peritransplant fluid collection. The visualized transplant ureter and urinary bladder appear unremarkable. Stomach/Bowel: There is sigmoid diverticulosis without active inflammatory changes. There is no bowel obstruction. The appendix is enlarged and inflamed. There is a stone at the base of the appendix. Several additional smaller stones noted in the lumen of the appendix. The appendix measures approximately 17 mm in diameter. There is periappendiceal stranding. The appendix is located in the right lower quadrant and extends posterior, medial, and inferior to the cecum. No evidence of perforation. No abscess. Vascular/Lymphatic: Moderate aortoiliac atherosclerotic disease. The IVC is unremarkable. No portal venous gas. There is no adenopathy. Reproductive: The prostate and seminal vesicles are grossly unremarkable. No pelvic mass. Other: None Musculoskeletal: Degenerative changes of the spine. No acute osseous pathology. IMPRESSION: 1. Acute appendicitis. No abscess or perforation. 2. Sigmoid diverticulosis. 3. Atrophic kidneys with right lower quadrant renal transplant. No hydronephrosis. 4. Cholelithiasis. 5. Aortic Atherosclerosis (ICD10-I70.0). Electronically Signed   By: Anner Crete M.D.   On: 12/02/2020 00:57    ED COURSE and MDM  Nursing notes, initial and subsequent vitals signs, including pulse oximetry, reviewed and interpreted by myself.  Vitals:   12/01/20 2321 12/01/20 2324 12/02/20 0108 12/02/20 0109  BP: 124/64  (!) 148/85 (!) 148/85  Pulse: 77 79  86  Resp: 18   16  Temp:      SpO2: 99% 91%  96%   Medications   piperacillin-tazobactam (ZOSYN) IVPB 3.375 g (3.375 g Intravenous New Bag/Given 12/02/20 0118)  ondansetron (ZOFRAN) injection 4 mg (4 mg Intravenous Given 12/01/20 2257)  fentaNYL (SUBLIMAZE) injection 50 mcg (50 mcg Intravenous Given 12/01/20 2256)  0.9 %  sodium chloride infusion ( Intravenous Stopped 12/02/20 0110)  iohexol (OMNIPAQUE) 9 MG/ML oral solution 500 mL (500 mLs Oral Contrast Given 12/01/20 2352)  iohexol (OMNIPAQUE) 350 MG/ML injection 60 mL (60 mLs Intravenous Contrast Given 12/02/20 0034)  fentaNYL (SUBLIMAZE) injection 50 mcg (50 mcg Intravenous Given 12/02/20 0117)   1:14 AM Zosyn IV ordered for acute appendicitis.  1:24 AM Dr. Bobbye Morton will consult for general surgery.  She is asking that we have the hospitalist admit due to the patient's multiple medical problems including transplant status.  1:41 AM Dr. Myna Hidalgo to admit to the hospitalist service.  PROCEDURES  Procedures   ED DIAGNOSES     ICD-10-CM   1. Acute appendicitis with localized peritonitis, without perforation, abscess, or gangrene  K35.30          Nakul Avino, MD 12/02/20 [phone]

## 2020-12-02 ENCOUNTER — Emergency Department (HOSPITAL_BASED_OUTPATIENT_CLINIC_OR_DEPARTMENT_OTHER): Payer: Medicare Other

## 2020-12-02 ENCOUNTER — Encounter (HOSPITAL_BASED_OUTPATIENT_CLINIC_OR_DEPARTMENT_OTHER): Payer: Self-pay | Admitting: Radiology

## 2020-12-02 DIAGNOSIS — E785 Hyperlipidemia, unspecified: Secondary | ICD-10-CM | POA: Diagnosis present

## 2020-12-02 DIAGNOSIS — I10 Essential (primary) hypertension: Secondary | ICD-10-CM | POA: Diagnosis not present

## 2020-12-02 DIAGNOSIS — I452 Bifascicular block: Secondary | ICD-10-CM | POA: Diagnosis present

## 2020-12-02 DIAGNOSIS — G4733 Obstructive sleep apnea (adult) (pediatric): Secondary | ICD-10-CM | POA: Diagnosis present

## 2020-12-02 DIAGNOSIS — E876 Hypokalemia: Secondary | ICD-10-CM | POA: Diagnosis not present

## 2020-12-02 DIAGNOSIS — I441 Atrioventricular block, second degree: Secondary | ICD-10-CM

## 2020-12-02 DIAGNOSIS — E1122 Type 2 diabetes mellitus with diabetic chronic kidney disease: Secondary | ICD-10-CM | POA: Diagnosis present

## 2020-12-02 DIAGNOSIS — F325 Major depressive disorder, single episode, in full remission: Secondary | ICD-10-CM | POA: Diagnosis present

## 2020-12-02 DIAGNOSIS — I25118 Atherosclerotic heart disease of native coronary artery with other forms of angina pectoris: Secondary | ICD-10-CM

## 2020-12-02 DIAGNOSIS — I129 Hypertensive chronic kidney disease with stage 1 through stage 4 chronic kidney disease, or unspecified chronic kidney disease: Secondary | ICD-10-CM | POA: Diagnosis present

## 2020-12-02 DIAGNOSIS — K3580 Unspecified acute appendicitis: Secondary | ICD-10-CM | POA: Diagnosis present

## 2020-12-02 DIAGNOSIS — I480 Paroxysmal atrial fibrillation: Secondary | ICD-10-CM

## 2020-12-02 DIAGNOSIS — Z992 Dependence on renal dialysis: Secondary | ICD-10-CM | POA: Diagnosis not present

## 2020-12-02 DIAGNOSIS — Z940 Kidney transplant status: Secondary | ICD-10-CM

## 2020-12-02 DIAGNOSIS — I2510 Atherosclerotic heart disease of native coronary artery without angina pectoris: Secondary | ICD-10-CM | POA: Diagnosis present

## 2020-12-02 DIAGNOSIS — Z66 Do not resuscitate: Secondary | ICD-10-CM | POA: Diagnosis present

## 2020-12-02 DIAGNOSIS — Z6372 Alcoholism and drug addiction in family: Secondary | ICD-10-CM | POA: Diagnosis not present

## 2020-12-02 DIAGNOSIS — E1169 Type 2 diabetes mellitus with other specified complication: Secondary | ICD-10-CM | POA: Diagnosis present

## 2020-12-02 DIAGNOSIS — I350 Nonrheumatic aortic (valve) stenosis: Secondary | ICD-10-CM

## 2020-12-02 DIAGNOSIS — M109 Gout, unspecified: Secondary | ICD-10-CM | POA: Diagnosis present

## 2020-12-02 DIAGNOSIS — K353 Acute appendicitis with localized peritonitis, without perforation or gangrene: Secondary | ICD-10-CM | POA: Diagnosis present

## 2020-12-02 DIAGNOSIS — I252 Old myocardial infarction: Secondary | ICD-10-CM | POA: Diagnosis not present

## 2020-12-02 DIAGNOSIS — K3589 Other acute appendicitis without perforation or gangrene: Secondary | ICD-10-CM | POA: Diagnosis not present

## 2020-12-02 DIAGNOSIS — Z20822 Contact with and (suspected) exposure to covid-19: Secondary | ICD-10-CM | POA: Diagnosis present

## 2020-12-02 DIAGNOSIS — K5730 Diverticulosis of large intestine without perforation or abscess without bleeding: Secondary | ICD-10-CM | POA: Diagnosis present

## 2020-12-02 DIAGNOSIS — N182 Chronic kidney disease, stage 2 (mild): Secondary | ICD-10-CM | POA: Diagnosis present

## 2020-12-02 DIAGNOSIS — Z7901 Long term (current) use of anticoagulants: Secondary | ICD-10-CM | POA: Diagnosis not present

## 2020-12-02 DIAGNOSIS — N400 Enlarged prostate without lower urinary tract symptoms: Secondary | ICD-10-CM | POA: Diagnosis present

## 2020-12-02 LAB — RESP PANEL BY RT-PCR (FLU A&B, COVID) ARPGX2
Influenza A by PCR: NEGATIVE
Influenza B by PCR: NEGATIVE
SARS Coronavirus 2 by RT PCR: NEGATIVE

## 2020-12-02 LAB — URINALYSIS, ROUTINE W REFLEX MICROSCOPIC
Bilirubin Urine: NEGATIVE
Glucose, UA: NEGATIVE mg/dL
Hgb urine dipstick: NEGATIVE
Ketones, ur: 40 mg/dL — AB
Leukocytes,Ua: NEGATIVE
Nitrite: NEGATIVE
Specific Gravity, Urine: 1.029 (ref 1.005–1.030)
pH: 5.5 (ref 5.0–8.0)

## 2020-12-02 LAB — HEPATIC FUNCTION PANEL
ALT: 14 U/L (ref 0–44)
AST: 20 U/L (ref 15–41)
Albumin: 3.1 g/dL — ABNORMAL LOW (ref 3.5–5.0)
Alkaline Phosphatase: 76 U/L (ref 38–126)
Bilirubin, Direct: 0.4 mg/dL — ABNORMAL HIGH (ref 0.0–0.2)
Indirect Bilirubin: 2.8 mg/dL — ABNORMAL HIGH (ref 0.3–0.9)
Total Bilirubin: 3.2 mg/dL — ABNORMAL HIGH (ref 0.3–1.2)
Total Protein: 6 g/dL — ABNORMAL LOW (ref 6.5–8.1)

## 2020-12-02 LAB — GLUCOSE, CAPILLARY
Glucose-Capillary: 132 mg/dL — ABNORMAL HIGH (ref 70–99)
Glucose-Capillary: 134 mg/dL — ABNORMAL HIGH (ref 70–99)
Glucose-Capillary: 132 mg/dL — ABNORMAL HIGH (ref 70–99)
Glucose-Capillary: 124 mg/dL — ABNORMAL HIGH (ref 70–99)

## 2020-12-02 LAB — HEPARIN LEVEL (UNFRACTIONATED): Heparin Unfractionated: 0.19 IU/mL — ABNORMAL LOW (ref 0.30–0.70)

## 2020-12-02 MED ORDER — HYDRALAZINE HCL 20 MG/ML IJ SOLN
10.0000 mg | INTRAMUSCULAR | Status: DC | PRN
Start: 2020-12-02 — End: 2020-12-05

## 2020-12-02 MED ORDER — IOHEXOL 350 MG/ML SOLN
60.0000 mL | Freq: Once | INTRAVENOUS | Status: AC | PRN
  Administered 2020-12-02: 60 mL via INTRAVENOUS

## 2020-12-02 MED ORDER — ATORVASTATIN CALCIUM 40 MG PO TABS
40.0000 mg | ORAL_TABLET | Freq: Every day | ORAL | Status: DC
  Administered 2020-12-02 – 2020-12-05 (×4): 40 mg via ORAL
  Filled 2020-12-02 (×4): qty 1

## 2020-12-02 MED ORDER — FINASTERIDE 5 MG PO TABS
5.0000 mg | ORAL_TABLET | Freq: Every day | ORAL | Status: DC
  Administered 2020-12-02 – 2020-12-05 (×4): 5 mg via ORAL
  Filled 2020-12-02 (×4): qty 1

## 2020-12-02 MED ORDER — MYCOPHENOLATE SODIUM 180 MG PO TBEC
360.0000 mg | DELAYED_RELEASE_TABLET | Freq: Two times a day (BID) | ORAL | Status: DC
  Administered 2020-12-02 – 2020-12-05 (×6): 360 mg via ORAL
  Filled 2020-12-02 (×7): qty 2

## 2020-12-02 MED ORDER — BUPROPION HCL ER (XL) 150 MG PO TB24
300.0000 mg | ORAL_TABLET | Freq: Every day | ORAL | Status: DC
  Administered 2020-12-02 – 2020-12-05 (×4): 300 mg via ORAL
  Filled 2020-12-02 (×4): qty 2

## 2020-12-02 MED ORDER — ONDANSETRON 4 MG PO TBDP: 4.0000 mg | ORAL_TABLET | Freq: Four times a day (QID) | ORAL | Status: DC | PRN

## 2020-12-02 MED ORDER — HEPARIN (PORCINE) 25000 UT/250ML-% IV SOLN
1450.0000 [IU]/h | INTRAVENOUS | Status: DC
  Administered 2020-12-02: 1250 [IU]/h via INTRAVENOUS
  Administered 2020-12-03: 1450 [IU]/h via INTRAVENOUS
  Filled 2020-12-02 (×2): qty 250

## 2020-12-02 MED ORDER — ALLOPURINOL 300 MG PO TABS
150.0000 mg | ORAL_TABLET | Freq: Every day | ORAL | Status: DC
  Administered 2020-12-02 – 2020-12-05 (×4): 150 mg via ORAL
  Filled 2020-12-02 (×4): qty 1

## 2020-12-02 MED ORDER — PIPERACILLIN-TAZOBACTAM 3.375 G IVPB 30 MIN
3.3750 g | Freq: Once | INTRAVENOUS | Status: AC
  Administered 2020-12-02: 3.375 g via INTRAVENOUS
  Filled 2020-12-02: qty 50

## 2020-12-02 MED ORDER — INSULIN ASPART 100 UNIT/ML IJ SOLN: 0.0000 [IU] | Freq: Three times a day (TID) | INTRAMUSCULAR | Status: DC

## 2020-12-02 MED ORDER — FENTANYL CITRATE (PF) 100 MCG/2ML IJ SOLN
50.0000 ug | Freq: Once | INTRAMUSCULAR | Status: AC
Start: 2020-12-02 — End: 2020-12-02
  Administered 2020-12-02: 50 ug via INTRAVENOUS
  Filled 2020-12-02: qty 2

## 2020-12-02 MED ORDER — TRAZODONE HCL 50 MG PO TABS
50.0000 mg | ORAL_TABLET | Freq: Every day | ORAL | Status: DC
  Administered 2020-12-02 – 2020-12-04 (×3): 50 mg via ORAL
  Filled 2020-12-02 (×3): qty 1

## 2020-12-02 MED ORDER — ACETAMINOPHEN 325 MG PO TABS
650.0000 mg | ORAL_TABLET | Freq: Four times a day (QID) | ORAL | Status: DC | PRN
  Administered 2020-12-03 – 2020-12-04 (×2): 650 mg via ORAL
  Filled 2020-12-02 (×3): qty 2

## 2020-12-02 MED ORDER — LATANOPROST 0.005 % OP SOLN
1.0000 [drp] | Freq: Every day | OPHTHALMIC | Status: DC
  Administered 2020-12-02 – 2020-12-04 (×3): 1 [drp] via OPHTHALMIC
  Filled 2020-12-02: qty 2.5

## 2020-12-02 MED ORDER — PIPERACILLIN-TAZOBACTAM 3.375 G IVPB
3.3750 g | Freq: Three times a day (TID) | INTRAVENOUS | Status: DC
  Administered 2020-12-02 – 2020-12-04 (×6): 3.375 g via INTRAVENOUS
  Filled 2020-12-02 (×7): qty 50

## 2020-12-02 MED ORDER — OXYCODONE HCL 5 MG PO TABS
5.0000 mg | ORAL_TABLET | ORAL | Status: DC | PRN
  Administered 2020-12-02: 10 mg via ORAL
  Administered 2020-12-02: 5 mg via ORAL
  Filled 2020-12-02: qty 1
  Filled 2020-12-02: qty 2

## 2020-12-02 MED ORDER — ONDANSETRON HCL 4 MG/2ML IJ SOLN
4.0000 mg | Freq: Four times a day (QID) | INTRAMUSCULAR | Status: DC | PRN
  Administered 2020-12-03: 4 mg via INTRAVENOUS
  Filled 2020-12-02: qty 2

## 2020-12-02 MED ORDER — ACETAMINOPHEN 650 MG RE SUPP: 650.0000 mg | Freq: Four times a day (QID) | RECTAL | Status: DC | PRN

## 2020-12-02 MED ORDER — TACROLIMUS 1 MG PO CAPS
1.0000 mg | ORAL_CAPSULE | Freq: Two times a day (BID) | ORAL | Status: DC
  Administered 2020-12-02 – 2020-12-05 (×6): 1 mg via ORAL
  Filled 2020-12-02 (×7): qty 1

## 2020-12-02 MED ORDER — GABAPENTIN 300 MG PO CAPS
300.0000 mg | ORAL_CAPSULE | Freq: Every day | ORAL | Status: DC
  Administered 2020-12-02 – 2020-12-04 (×3): 300 mg via ORAL
  Filled 2020-12-02 (×3): qty 1

## 2020-12-02 MED ORDER — LACTATED RINGERS IV SOLN: INTRAVENOUS | Status: DC

## 2020-12-02 MED ORDER — FENTANYL CITRATE (PF) 100 MCG/2ML IJ SOLN
50.0000 ug | INTRAMUSCULAR | Status: DC | PRN
  Administered 2020-12-02: 50 ug via INTRAVENOUS
  Filled 2020-12-02: qty 2

## 2020-12-02 MED ORDER — TACROLIMUS 0.5 MG PO CAPS
0.5000 mg | ORAL_CAPSULE | Freq: Two times a day (BID) | ORAL | Status: DC
  Administered 2020-12-02 – 2020-12-05 (×6): 0.5 mg via ORAL
  Filled 2020-12-02 (×7): qty 1

## 2020-12-02 NOTE — ED Notes (Signed)
Patient transported to CT 

## 2020-12-02 NOTE — Progress Notes (Addendum)
ANTICOAGULATION CONSULT NOTE - Initial Consult  Pharmacy Consult for Heparin Indication: atrial fibrillation while warfarin on hold  Allergies  Allergen Reactions   Losartan Anaphylaxis   Crestor [Rosuvastatin] Other (See Comments)    Elevated LFT's   Lorazepam     Pt is unsure of reaction    Morphine And Related Other (See Comments)    Has no effect on pt     Patient Measurements:   Heparin Dosing Weight:85.6 kg   Vital Signs: Temp: 98.3 F (36.8 C) (08/27 1106) Temp Source: Oral (08/27 1106) BP: 121/59 (08/27 1106) Pulse Rate: 65 (08/27 1106)  Labs: Recent Labs    11/30/20 1152 12/01/20 1921 12/01/20 2306  HGB 15.5 14.7  --   HCT 46.5 43.8  --   PLT 140 136*  --   LABPROT 20.6*  --  19.8*  INR 2.1*  --  1.7*  CREATININE  --  0.97  --     Estimated Creatinine Clearance: 73.3 mL/min (by C-G formula based on SCr of 0.97 mg/dL).   Medical History: Past Medical History:  Diagnosis Date   Adenomatous colon polyp    Allergy    BPH (benign prostatic hyperplasia)    CHF (congestive heart failure) ([place])    Chronic kidney disease    due IgA nephropathy - s/p kidnet transplant 09/25/11   Diabetes mellitus type 2, controlled ([place])    FHx: heart disease 02/27/2018   Glaucoma    Gout    Histoplasmosis    on itraconazole for prophylaxis   MI (myocardial infarction) ([place]) 09/26/2011   NSTEMI (non-ST elevated myocardial infarction) ([place]) 11/26/2013   2013    OSA (obstructive sleep apnea)    PAF (paroxysmal atrial fibrillation) ([place])    s/p DC-CV in 6/13. Off coumadin due to ureteral bleed   Testosterone deficiency 02/27/2018     Assessment: 75 yo male presented on 12/01/2020 with lower abdominal pain concerning for appendicitis. Patient is on warfarin prior to admission for Afib. Last dose 8/26. INR 2.1 on admission and down to 1.7. Hgb 14.7. Plt 136.   Pharmacy also consulted to dose Zosyn. One time dose of Zosyn 3.375g (30 minute infusion) given in ED.   Goal  of Therapy:  Heparin level 0.3-0.7 units/ml Monitor platelets by anticoagulation protocol: Yes   Plan:  Start heparin 1250 units/hr  Check 8 hr heparin level  Monitor heparin level, CBC and s/s of bleeding daily  F/u surgical plans - plan for nonoperative management trial for now  Start Zosyn 3.375g IV q8h (extended infusion)  Cristela Felt, PharmD, BCPS Clinical Pharmacist 12/02/2020 12:10 PM

## 2020-12-02 NOTE — ED Notes (Signed)
Report given to [facility] 

## 2020-12-02 NOTE — H&P (Signed)
History and Physical    Robert Arnold RJJ:[id number] DOB: 1945-08-26 DOA: 12/01/2020  PCP: [name], MD Consultants:  Nahser - cardiology; Sherwood Gambler - neurosurgery; Armbruster - GI; Hollie Salk - nephrology Patient coming from:  Home - lives with wife; NOK: Robert Arnold, [phone]  Chief Complaint: Abdominal pain  HPI: Robert Arnold is a 75 y.o. 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  Patient felt fine on Thursday and had a normal BM.  He awoke yesterday AM with lower abdominal pain.  He has had mild nausea without vomiting and has had anorexia.  No further BM since.  No fever.  No other symptoms.  Pain was severe and persistent and so he came to [facility] for evaluation.  He did take his immunosuppressants this AM.      ED Course: [facility] to [facility] transfer, per Dr. Myna Hidalgo:  Accepted from [place] for acute appendicitis. 70 yom with hx of PAF on coumadin, CAD, renal transplant recipient, CKD 2, and IDDM presenting with 1 day of lower abdominal pain and nausea. Has mild leukocytosis and acute appendicitis on CT without perf or abscess. INR is 1.7. Dr. Bobbye Morton of surgery will see at [facility] in consultation. Started on Zosyn, COVID screen pending.   Review of Systems: As per HPI; otherwise review of systems reviewed and negative.   Ambulatory Status:  Ambulates without assistance  COVID Vaccine Status:   Complete plus booster(s)  Past Medical History:  Diagnosis Date   Adenomatous colon polyp    Allergy    BPH (benign prostatic hyperplasia)    CHF (congestive heart failure) ([place])    Chronic kidney disease    due IgA nephropathy - s/p kidnet transplant 09/25/11   Diabetes mellitus type 2, controlled ([place])    FHx: heart disease 02/27/2018   Glaucoma    Gout    Histoplasmosis    on itraconazole for prophylaxis   MI (myocardial infarction) ([place]) 09/26/2011   NSTEMI (non-ST  elevated myocardial infarction) ([place]) 11/26/2013   2013    OSA (obstructive sleep apnea)    PAF (paroxysmal atrial fibrillation) ([place])    s/p DC-CV in 6/13. Off coumadin due to ureteral bleed   Testosterone deficiency 02/27/2018    Past Surgical History:  Procedure Laterality Date   ANTERIOR CERVICAL DECOMP/DISCECTOMY FUSION N/A 05/04/2018   Procedure: Cervical Five-Six Anterior cervical decompression/discectomy/fusion;  Surgeon: Jovita Gamma, MD;  Location: [place];  Service: Neurosurgery;  Laterality: N/A;  Cervical Five-Six Anterior cervical decompression/discectomy/fusion   AV FISTULA PLACEMENT  2011   Left forearm   COLONOSCOPY     CORONARY ANGIOPLASTY WITH STENT PLACEMENT     CORONARY ARTERY BYPASS GRAFT  2013   KIDNEY TRANSPLANT  09/25/2011   PERITONEAL CATHETER INSERTION  2011    Social History   Socioeconomic History   Marital status: Married    Spouse name: Not on file   Number of children: 1   Years of education: Not on file   Highest education level: Not on file  Occupational History   Occupation: retired  Tobacco Use   Smoking status: Former    Packs/day: 2.00    Years: 24.00    Pack years: 48.00    Types: Cigarettes    Start date: 1968    Quit date: 11/29/1990    Years since quitting: 30.0   Smokeless tobacco: Never  Vaping Use   Vaping Use: Never used  Substance and Sexual Activity   Alcohol use: Yes    Comment: 1-2 a month   Drug use: No   Sexual activity: Not on file  Other Topics Concern   Not on file  Social History Narrative   Not on file   Social Determinants of Health   Financial Resource Strain: Not on file  Food Insecurity: Not on file  Transportation Needs: Not on file  Physical Activity: Not on file  Stress: Not on file  Social Connections: Not on file  Intimate Partner Violence: Not on file    Allergies  Allergen Reactions   Losartan Anaphylaxis   Crestor [Rosuvastatin] Other (See Comments)    Elevated LFT's   Lorazepam      Pt is unsure of reaction    Morphine And Related Other (See Comments)    Has no effect on pt     Family History  Problem Relation Age of Onset   Heart attack Father        Unsure    Diabetes Mother    COPD Mother    Kidney disease Sister        no longer speak   Alcoholism Brother    Alzheimer's disease Brother    Alcohol abuse Maternal Grandfather    Alcohol abuse Brother    COPD Brother    Colon cancer Neg Hx    Stomach cancer Neg Hx    Rectal cancer Neg Hx    Esophageal cancer Neg Hx    Liver cancer Neg Hx     Prior to Admission medications   Medication Sig Start Date End Date Taking? Authorizing Provider  allopurinol (ZYLOPRIM) 300 MG tablet Take 1/2 tablet Daily  to Prevent  Gout 11/30/20   Liane Comber, NP  Ascorbic Acid (VITAMIN C) 1000 MG tablet Take 1,000 mg by mouth daily.    [provider]  aspirin EC 81 MG tablet Take 81 mg by mouth daily.    [provider]  atorvastatin (LIPITOR) 40 MG tablet Take     1 tablet     Daily      for Cholesterol 09/13/20   [name], MD  B Complex-Biotin-FA (B COMPLETE) TABS Take 1 tablet by mouth daily.    [provider]  Black Pepper-Turmeric (TURMERIC COMPLEX/BLACK PEPPER PO) Take 1,500 mg by mouth daily.    [provider]  buPROPion (WELLBUTRIN XL) 300 MG 24 hr tablet Take  1 tablet  Daily for Mood, Focus & Concentration 09/20/20   [name], MD  Calcium Carbonate-Vitamin D (OYSTER SHELL CALCIUM/D) 250-125 MG-UNIT TABS Take by mouth.    [provider]  finasteride (PROSCAR) 5 MG tablet Take  1 tablet  Daily  for Prostate 06/28/20   [name], MD  gabapentin (NEURONTIN) 300 MG capsule TAKE ONE CAPSULE BY MOUTH THREE TIMES A DAY AS NEEDED FOR PAIN 06/19/20   Liane Comber, NP  glucose blood (FREESTYLE TEST STRIPS) test strip Check blood sugar 3 to 4 times daily for medication regulation. 12/04/15   [name], MD  insulin NPH-regular Human (NOVOLIN 70/30)  (70-30) 100 UNIT/ML injection Take 20-30 units 2 x day or as directed 02/12/14   [name], MD  latanoprost (XALATAN) 0.005 % ophthalmic solution Place 1 drop into both eyes at bedtime.  04/11/16   [provider]  magic mouthwash w/lidocaine SOLN Swish and spit 5 ml every 2 hours as needed. 07/26/20   Liane Comber, NP  MAGNESIUM-OXIDE 400 (241.3 Mg) MG  tablet Take 400 mg by mouth 2 (two) times daily.  12/29/15   [provider]  mycophenolate (MYFORTIC) 360 MG TBEC EC tablet TAKE 1 TABLET BY MOUTH TWICE DAILY 05/06/16   [name], MD  Probiotic Product (PROBIOTIC DAILY PO) Take 1 capsule by mouth daily.    [provider]  tacrolimus (PROGRAF) 0.5 MG capsule Take 0.5 mg by mouth 2 (two) times daily.    [provider]  tacrolimus (PROGRAF) 1 MG capsule Take 1 mg by mouth 2 (two) times daily.    [provider]  traZODone (DESYREL) 50 MG tablet Take  1 tablet  1 hour  before Bedtime  as needed for Sleep 07/19/20   [name], MD  VITAMIN D PO Take 5,000 Units by mouth. Takes two 5000 unit capsules on MWF and 1 capsule 4 days a week.    [provider]  warfarin (COUMADIN) 5 MG tablet TAKE ONE TO TWO TABLETS BY MOUTH EVERY DAY OR AS DIRECTED 07/27/20   Liane Comber, NP  Zinc 50 MG CAPS Take 50 mg by mouth daily.    [provider]    Physical Exam: Vitals:   12/02/20 0108 12/02/20 0109 12/02/20 0508 12/02/20 1106  BP: (!) 148/85 (!) 148/85 117/64 (!) 121/59  Pulse:  86 71 65  Resp:  16 16 19   Temp:   98.6 F (37 C) 98.3 F (36.8 C)  TempSrc:   Oral Oral  SpO2:  96% 97% 97%     General:  Appears calm and comfortable and is in NAD Eyes:   EOMI, normal lids, iris ENT:  grossly normal hearing, lips & tongue, mmm; appropriate dentition Neck:  no LAD, masses or thyromegaly Cardiovascular:  RRR, no r/g, 1-9/3 systolic murmur. No LE edema.  Respiratory:   CTA bilaterally with no wheezes/rales/rhonchi.  Normal  respiratory effort. Abdomen:  soft, mildly to moderately TTP in RLQ > mid-lower abdomen, ND, hypoactive BS Skin:  no rash or induration seen on limited exam Musculoskeletal:  grossly normal tone BUE/BLE, good ROM, no bony abnormality Psychiatric:  grossly normal mood and affect, speech fluent and appropriate, AOx3 Neurologic:  CN 2-12 grossly intact, moves all extremities in coordinated fashion    Radiological Exams on Admission: Independently reviewed - see discussion in A/P where applicable  CT ABDOMEN PELVIS W CONTRAST  Result Date: 12/02/2020 CLINICAL DATA:  Abdominal pain.  Concern for diverticulitis. EXAM: CT ABDOMEN AND PELVIS WITH CONTRAST TECHNIQUE: Multidetector CT imaging of the abdomen and pelvis was performed using the standard protocol following bolus administration of intravenous contrast. CONTRAST:  60mL OMNIPAQUE IOHEXOL 350 MG/ML SOLN COMPARISON:  CT abdomen pelvis dated 12/11/2010. FINDINGS: Lower chest: The visualized lung bases are clear. There is mild cardiomegaly. Coronary vascular calcifications. No intra-abdominal free air or free fluid. Hepatobiliary: Fatty liver. No intrahepatic biliary ductal dilatation. Layering small stones within the gallbladder. No pericholecystic fluid. Pancreas: Unremarkable. No pancreatic ductal dilatation or surrounding inflammatory changes. Spleen: Small scattered calcified splenic granuloma. Adrenals/Urinary Tract: The adrenal glands unremarkable. The kidneys are atrophic. Multiple bilateral renal cysts measuring up to 4 cm in the interpolar right kidney. There is a right lower quadrant renal transplant. There is no hydronephrosis of the transplant kidney. No peritransplant fluid collection. The visualized transplant ureter and urinary bladder appear unremarkable. Stomach/Bowel: There is sigmoid diverticulosis without active inflammatory changes. There is no bowel obstruction. The appendix is enlarged and inflamed. There is a stone at the base of  the appendix. Several additional  smaller stones noted in the lumen of the appendix. The appendix measures approximately 17 mm in diameter. There is periappendiceal stranding. The appendix is located in the right lower quadrant and extends posterior, medial, and inferior to the cecum. No evidence of perforation. No abscess. Vascular/Lymphatic: Moderate aortoiliac atherosclerotic disease. The IVC is unremarkable. No portal venous gas. There is no adenopathy. Reproductive: The prostate and seminal vesicles are grossly unremarkable. No pelvic mass. Other: None Musculoskeletal: Degenerative changes of the spine. No acute osseous pathology. IMPRESSION: 1. Acute appendicitis. No abscess or perforation. 2. Sigmoid diverticulosis. 3. Atrophic kidneys with right lower quadrant renal transplant. No hydronephrosis. 4. Cholelithiasis. 5. Aortic Atherosclerosis (ICD10-I70.0). Electronically Signed   By: Anner Crete M.D.   On: 12/02/2020 00:57    EKG: Independently reviewed.  Afib with rate 59; bifascicular block, appears to be similar to prior   Labs on Admission: I have personally reviewed the available labs and imaging studies at the time of the admission.  Pertinent labs:   Glucose 120 Bili 3.3 WBC 11.2 Platelets 136 INR 1.7 COVID/flu negative UA: 40 ketones, trace protein   Assessment/Plan Principal Problem:   Acute appendicitis Active Problems:   Hyperlipidemia associated with type 2 diabetes mellitus (HCC)   PAF (paroxysmal atrial fibrillation) ([place])   Essential hypertension   Renal Transplant, s/p 09/2011   Depression, major, in remission ([place])   Appendicitis -Patient presenting with acute onset of RLQ abdominal pain yesterday -Imaging at [facility] c/w appendicitis -Appears appropriate for surgery, Dr. Grandville Silos to see and anticipate OR today -His only SIRS criteria is mild leukocytosis, no current concern for sepsis -For now, will give bowel rest, IVF, pain medication with fentanyl, nausea  medication with Zofran, and treat with Zosyn for intraabdominal infection  Pre-operative clearance -Abdominal surgery is associated with an intermediate (1-5%) cardiovascular risk for cardiac death and nonfatal MI -With his h/o insulin-requiring DM and CAD, his revised cardiac index gives a risk estimate of 6.6% -Because of this risk, he is recommended to have pre-operative EKG testing prior to surgery; this was done -Her Detsky's Modified Cardiac Risk Index score is Class III, with a moderately high cardiac risk -Will request cardiology consultation for clearance but anticipate that patient will be cleared for surgery later today  H/o renal transplant -Continue immunosuppression medications - Tacrolimus and Myfortic -Renal function is stable and normal currently -Will follow renal function  CAD -Hold ASA for now since he appears to need surgery -EKG appears to be stable but is abnormal with high risk score, as above -Cardiology consult for clearance requested  HLD -Continue Lipitor  Afib -Does not appear to be taking medications for rate control -Hold Coumadin -Will cover with heparin drip peri-operatively  OSA -Will order CPAP  DM -Recent A1c was 5.9, indicating good control -hold 70/30 for now given controlled glucose and NPO status -Cover with moderate-scale SSI   Gout -No current flare -Continue Allopurinol  Mood d/o -Continue Wellbutrin, trazodone  BPH -Continue Proscar    Note: This patient has been tested and is negative for the novel coronavirus COVID-19. The patient has been fully vaccinated against COVID-19.   Level of care: Med-Surg DVT prophylaxis: Heparin drip Code Status:  DNR - confirmed with patient Family Communication: None present Disposition Plan:  The patient is from: home  Anticipated d/c is to: home without [facility] services   Anticipated d/c date will depend on clinical response to treatment, likely 2-3 days   Patient is currently: acutely  ill Consults called: Surgery;  cardiology  Admission status:  Admit - It is my clinical opinion that admission to INPATIENT is reasonable and necessary because of the expectation that this patient will require hospital care that crosses at least 2 midnights to treat this condition based on the medical complexity of the problems presented.  Given the aforementioned information, the predictability of an adverse outcome is felt to be significant.    Karmen Bongo MD [facility]   How to contact the [facility] Attending or Consulting provider [place] or covering provider during after hours [place], for this patient?  Check the care team in [facility] and look for a) attending/consulting [facility] provider listed and b) the [facility] team listed Log into [email or web address] and use [place]'s universal password to access. If you do not have the password, please contact the hospital operator. Locate the [facility] provider you are looking for under [facility] and page to a number that you can be directly reached. If you still have difficulty reaching the provider, please page the [facility] (Director on Call) for the Hospitalists listed on amion for assistance.   12/02/2020, 11:40 AM

## 2020-12-02 NOTE — Progress Notes (Signed)
ANTICOAGULATION CONSULT NOTE  Pharmacy Consult for Heparin Indication: atrial fibrillation while warfarin on hold  Allergies  Allergen Reactions   Losartan Anaphylaxis   Crestor [Rosuvastatin] Other (See Comments)    Elevated LFT's   Lorazepam     Pt is unsure of reaction    Morphine And Related Other (See Comments)    Has no effect on pt     Patient Measurements: Height: 5\' 8"  (172.7 cm) Weight: 85.7 kg (188 lb 15 oz) IBW/kg (Calculated) : 68.4 Heparin Dosing Weight:85.6 kg   Vital Signs: Temp: 98.8 F (37.1 C) (08/27 2016) Temp Source: Oral (08/27 2016) BP: 145/72 (08/27 2016) Pulse Rate: 68 (08/27 2016)  Labs: Recent Labs    11/30/20 1152 12/01/20 1921 12/01/20 2306 12/02/20 2156  HGB 15.5 14.7  --   --   HCT 46.5 43.8  --   --   PLT 140 136*  --   --   LABPROT 20.6*  --  19.8*  --   INR 2.1*  --  1.7*  --   HEPARINUNFRC  --   --   --  0.19*  CREATININE  --  0.97  --   --      Estimated Creatinine Clearance: 73.3 mL/min (by C-G formula based on SCr of 0.97 mg/dL).   Medical History: Past Medical History:  Diagnosis Date   Adenomatous colon polyp    Allergy    BPH (benign prostatic hyperplasia)    CHF (congestive heart failure) ([place])    Chronic kidney disease    due IgA nephropathy - s/p kidnet transplant 09/25/11   Diabetes mellitus type 2, controlled ([place])    FHx: heart disease 02/27/2018   Glaucoma    Gout    Histoplasmosis    on itraconazole for prophylaxis   MI (myocardial infarction) ([place]) 09/26/2011   NSTEMI (non-ST elevated myocardial infarction) ([place]) 11/26/2013   2013    OSA (obstructive sleep apnea)    PAF (paroxysmal atrial fibrillation) ([place])    s/p DC-CV in 6/13. Off coumadin due to ureteral bleed   Testosterone deficiency 02/27/2018     Assessment: 75 yo male presented on 12/01/2020 with lower abdominal pain concerning for appendicitis. Patient is on warfarin prior to admission for Afib. Last dose 8/26. INR 2.1 on admission and  down to 1.7. Hgb 14.7. Plt 136.   Initial heparin level subtherapeutic at 0.19.   Goal of Therapy:  Heparin level 0.3-0.7 units/ml Monitor platelets by anticoagulation protocol: Yes   Plan:  Increase heparin to 1450 units/h Recheck heparin level in 8h  Arrie Senate, PharmD, [place], [facility] Clinical Pharmacist [phone] Please check AMION for all [place] numbers 12/02/2020

## 2020-12-02 NOTE — Progress Notes (Signed)
Spoke with pt about wearing CPAP while admitted. Pt states does not use CPAP at home, had sleep study several years ago, and did not need a machine. Order discontinued per RT protocol.

## 2020-12-02 NOTE — Progress Notes (Addendum)
Robert Arnold [birth year]  [id number].    Requesting MD: Dr. Lorin Mercy Chief Complaint/Reason for Consult: Acute appendicitis   HPI: Robert Arnold is a 75 y.o. male with a hx of prior ESRD (PD dialysis 2011-2013, renal transplant 2013), CAD s/p CABG, PAF on Coumadin, Dm2, HTN, OSA, 1st degree heart block who presented with lower abdominal pain.  Patient reports that yesterday morning he awoke with lower abdominal pain that was slightly worse on the right side and associated w/ anorexia.  Denies any fever, chills, nausea, vomiting or diarrhea.  He denies history of similar pain in the past.  Palpation seems to make this worse.  He reports that his pain became progressively worse to a 9/10.  He presented the ED for evaluation.  He notes after IV pain medication his pain is now a 4/10.  He underwent work-up and was found to have acute appendicitis on CT.  He was transferred to [facility] for evaluation.  We were asked to see.  ROS: Review of Systems  Constitutional:  Negative for chills and fever.  Respiratory:  Negative for cough and shortness of breath.   Cardiovascular:  Negative for chest pain.  Gastrointestinal:  Positive for abdominal pain. Negative for diarrhea, nausea and vomiting.  All other systems reviewed and are negative.  Family History  Problem Relation Age of Onset   Heart attack Father        Unsure    Diabetes Mother    COPD Mother    Kidney disease Sister        no longer speak   Alcoholism Brother    Alzheimer's disease Brother    Alcohol abuse Maternal Grandfather    Alcohol abuse Brother    COPD Brother    Colon cancer Neg Hx    Stomach cancer Neg Hx    Rectal cancer Neg Hx    Esophageal cancer Neg Hx    Liver cancer Neg Hx     Past Medical History:  Diagnosis Date   Adenomatous colon polyp    Allergy    BPH (benign prostatic hyperplasia)    CHF (congestive heart failure) ([place])    Chronic kidney disease    due IgA nephropathy - s/p kidnet  transplant 09/25/11   Diabetes mellitus type 2, controlled ([place])    FHx: heart disease 02/27/2018   Glaucoma    Gout    Histoplasmosis    on itraconazole for prophylaxis   MI (myocardial infarction) ([place]) 09/26/2011   NSTEMI (non-ST elevated myocardial infarction) ([place]) 11/26/2013   2013    OSA (obstructive sleep apnea)    PAF (paroxysmal atrial fibrillation) ([place])    s/p DC-CV in 6/13. Off coumadin due to ureteral bleed   Testosterone deficiency 02/27/2018    Past Surgical History:  Procedure Laterality Date   ANTERIOR CERVICAL DECOMP/DISCECTOMY FUSION N/A 05/04/2018   Procedure: Cervical Five-Six Anterior cervical decompression/discectomy/fusion;  Surgeon: Jovita Gamma, MD;  Location: [facility];  Service: Neurosurgery;  Laterality: N/A;  Cervical Five-Six Anterior cervical decompression/discectomy/fusion   AV FISTULA PLACEMENT  2011   Left forearm   COLONOSCOPY     CORONARY ANGIOPLASTY WITH STENT PLACEMENT     CORONARY ARTERY BYPASS GRAFT  2013   KIDNEY TRANSPLANT  09/25/2011   PERITONEAL CATHETER INSERTION  2011    Social History:  reports that he quit smoking about 30 years ago. His smoking use included cigarettes. He started smoking about 54 years ago. He has a 48.00 pack-year smoking  history. He has never used smokeless tobacco. He reports current alcohol use. He reports that he does not use drugs. Prior tobacco use Occassional alcohol use Retired Lives at home with his wife  Allergies:  Allergies  Allergen Reactions   Losartan Anaphylaxis   Crestor [Rosuvastatin]     Elevated LFT's   Lorazepam     Pt is unsure of reaction    Morphine And Related Other (See Comments)    Has no effect on pt     Medications Prior to Admission  Medication Sig Dispense Refill   allopurinol (ZYLOPRIM) 300 MG tablet Take 1/2 tablet Daily  to Prevent  Gout 90 tablet 3   Ascorbic Acid (VITAMIN C) 1000 MG tablet Take 1,000 mg by mouth daily.     aspirin EC 81 MG tablet Take 81 mg by mouth  daily.     atorvastatin (LIPITOR) 40 MG tablet Take     1 tablet     Daily      for Cholesterol 90 tablet 0   B Complex-Biotin-FA (B COMPLETE) TABS Take 1 tablet by mouth daily.     Black Pepper-Turmeric (TURMERIC COMPLEX/BLACK PEPPER PO) Take 1,500 mg by mouth daily.     buPROPion (WELLBUTRIN XL) 300 MG 24 hr tablet Take  1 tablet  Daily for Mood, Focus & Concentration 90 tablet 3   Calcium Carbonate-Vitamin D (OYSTER SHELL CALCIUM/D) 250-125 MG-UNIT TABS Take by mouth.     finasteride (PROSCAR) 5 MG tablet Take  1 tablet  Daily  for Prostate 90 tablet 1   gabapentin (NEURONTIN) 300 MG capsule TAKE ONE CAPSULE BY MOUTH THREE TIMES A DAY AS NEEDED FOR PAIN 270 capsule 1   glucose blood (FREESTYLE TEST STRIPS) test strip Check blood sugar 3 to 4 times daily for medication regulation. 450 each PRN   insulin NPH-regular Human (NOVOLIN 70/30) (70-30) 100 UNIT/ML injection Take 20-30 units 2 x day or as directed 10 mL 99   latanoprost (XALATAN) 0.005 % ophthalmic solution Place 1 drop into both eyes at bedtime.   4   magic mouthwash w/lidocaine SOLN Swish and spit 5 ml every 2 hours as needed. 240 mL 0   MAGNESIUM-OXIDE 400 (241.3 Mg) MG tablet Take 400 mg by mouth 2 (two) times daily.   6   mycophenolate (MYFORTIC) 360 MG TBEC EC tablet TAKE 1 TABLET BY MOUTH TWICE DAILY 180 tablet 1   Probiotic Product (PROBIOTIC DAILY PO) Take 1 capsule by mouth daily.     tacrolimus (PROGRAF) 0.5 MG capsule Take 0.5 mg by mouth 2 (two) times daily.     tacrolimus (PROGRAF) 1 MG capsule Take 1 mg by mouth 2 (two) times daily.     traZODone (DESYREL) 50 MG tablet Take  1 tablet  1 hour  before Bedtime  as needed for Sleep 90 tablet 3   VITAMIN D PO Take 5,000 Units by mouth. Takes two 5000 unit capsules on MWF and 1 capsule 4 days a week.     warfarin (COUMADIN) 5 MG tablet TAKE ONE TO TWO TABLETS BY MOUTH EVERY DAY OR AS DIRECTED 180 tablet 2   Zinc 50 MG CAPS Take 50 mg by mouth daily.       Physical  Exam: Blood pressure 117/64, pulse 71, temperature 98.6 F (37 C), temperature source Oral, resp. rate 16, SpO2 97 %. General: pleasant, WD/WN white male who is laying in bed in NAD HEENT: head is normocephalic, atraumatic.  Sclera are noninjected.  PERRL.  Ears and nose without any masses or lesions.  Mouth is pink and moist. Dentition fair Heart: regular, rate, and rhythm.  Normal s1,s2. No obvious murmurs, gallops, or rubs noted.  Palpable pedal pulses bilaterally  Lungs: CTAB, no wheezes, rhonchi, or rales noted.  Respiratory effort nonlabored Abd: Soft, ND, suprapubic and diffuse right sided abdominal pain without peritonitis, +BS, no masses, hernias, or organomegaly. Prior abdominal scars noted and well healed.  MS: no BUE/BLE edema, calves soft and nontender Skin: warm and dry with no masses, lesions, or rashes Psych: A&Ox4 with an appropriate affect Neuro: cranial nerves grossly intact, equal strength in BUE/BLE bilaterally, normal speech, thought process intact, moves all extremities, gait not assessed   Results for orders placed or performed during the hospital encounter of 12/01/20 (from the past 48 hour(s))  Lipase, blood     Status: Abnormal   Collection Time: 12/01/20  7:21 PM  Result Value Ref Range   Lipase <10 (L) 11 - 51 U/L    Comment: Performed at [facility], [address], [place], [place] [address]  Comprehensive metabolic panel     Status: Abnormal   Collection Time: 12/01/20  7:21 PM  Result Value Ref Range   Sodium 136 135 - 145 mmol/L   Potassium 4.2 3.5 - 5.1 mmol/L   Chloride 100 98 - 111 mmol/L   CO2 24 22 - 32 mmol/L   Glucose, Bld 120 (H) 70 - 99 mg/dL    Comment: Glucose reference range applies only to samples taken after fasting for at least 8 hours.   BUN 22 8 - 23 mg/dL   Creatinine, Ser 0.97 0.61 - 1.24 mg/dL   Calcium 9.5 8.9 - 10.3 mg/dL   Total Protein 6.7 6.5 - 8.1 g/dL   Albumin 4.1 3.5 - 5.0 g/dL   AST 20 15 - 41 U/L    ALT 12 0 - 44 U/L   Alkaline Phosphatase 86 38 - 126 U/L   Total Bilirubin 3.3 (H) 0.3 - 1.2 mg/dL   GFR, Estimated >60 >60 mL/min    Comment: (NOTE) Calculated using the CKD-EPI Creatinine Equation (2021)    Anion gap 12 5 - 15    Comment: Performed at [facility], [place], [place] [address]  CBC     Status: Abnormal   Collection Time: 12/01/20  7:21 PM  Result Value Ref Range   WBC 11.2 (H) 4.0 - 10.5 K/uL   RBC 4.65 4.22 - 5.81 MIL/uL   Hemoglobin 14.7 13.0 - 17.0 g/dL   HCT 43.8 39.0 - 52.0 %   MCV 94.2 80.0 - 100.0 fL   MCH 31.6 26.0 - 34.0 pg   MCHC 33.6 30.0 - 36.0 g/dL   RDW 12.8 11.5 - 15.5 %   Platelets 136 (L) 150 - 400 K/uL   nRBC 0.0 0.0 - 0.2 %    Comment: Performed at [facility], [address] [place], [place] [address]  Protime-INR     Status: Abnormal   Collection Time: 12/01/20 11:06 PM  Result Value Ref Range   Prothrombin Time 19.8 (H) 11.4 - 15.2 seconds   INR 1.7 (H) 0.8 - 1.2    Comment: (NOTE) INR goal varies based on device and disease states. Performed at [facility], [address], [place], [place] [address]   Urinalysis, Routine w reflex microscopic Urine, Clean Catch     Status: Abnormal   Collection Time: 12/02/20  1:00 AM  Result Value Ref Range   Color, Urine YELLOW YELLOW   APPearance CLEAR CLEAR   Specific Gravity, Urine 1.029 1.005 - 1.030   pH 5.5 5.0 - 8.0   Glucose, UA NEGATIVE NEGATIVE mg/dL   Hgb urine dipstick NEGATIVE NEGATIVE   Bilirubin Urine NEGATIVE NEGATIVE   Ketones, ur 40 (A) NEGATIVE mg/dL   Protein, ur TRACE (A) NEGATIVE mg/dL   Nitrite NEGATIVE NEGATIVE   Leukocytes,Ua NEGATIVE NEGATIVE    Comment: Performed at [facility], [place], [place] [address]  Resp Panel by RT-PCR (Flu A&B, Covid) Nasopharyngeal Swab     Status: None   Collection Time: 12/02/20  1:20 AM   Specimen: Nasopharyngeal Swab;  Nasopharyngeal(NP) swabs in vial transport medium  Result Value Ref Range   SARS Coronavirus 2 by RT PCR NEGATIVE NEGATIVE    Comment: (NOTE) SARS-CoV-2 target nucleic acids are NOT DETECTED.  The SARS-CoV-2 RNA is generally detectable in upper respiratory specimens during the acute phase of infection. The lowest concentration of SARS-CoV-2 viral copies this assay can detect is 138 copies/mL. A negative result does not preclude SARS-Cov-2 infection and should not be used as the sole basis for treatment or other patient management decisions. A negative result may occur with  improper specimen collection/handling, submission of specimen other than nasopharyngeal swab, presence of viral mutation(s) within the areas targeted by this assay, and inadequate number of viral copies(<138 copies/mL). A negative result must be combined with clinical observations, patient history, and epidemiological information. The expected result is Negative.  Fact Sheet for Patients:  EntrepreneurPulse.com.au  Fact Sheet for Healthcare Providers:  IncredibleEmployment.be  This test is no t yet approved or cleared by the [place] FDA and  has been authorized for detection and/or diagnosis of SARS-CoV-2 by FDA under an Emergency Use Authorization (EUA). This EUA will remain  in effect (meaning this test can be used) for the duration of the COVID-19 declaration under Section 564(b)(1) of the Act, 21 U.S.C.section 360bbb-3(b)(1), unless the authorization is terminated  or revoked sooner.       Influenza A by PCR NEGATIVE NEGATIVE   Influenza B by PCR NEGATIVE NEGATIVE    Comment: (NOTE) The Xpert Xpress SARS-CoV-2/FLU/RSV plus assay is intended as an aid in the diagnosis of influenza from Nasopharyngeal swab specimens and should not be used as a sole basis for treatment. Nasal washings and aspirates are unacceptable for Xpert Xpress  SARS-CoV-2/FLU/RSV testing.  Fact Sheet for Patients: EntrepreneurPulse.com.au  Fact Sheet for Healthcare Providers: IncredibleEmployment.be  This test is not yet approved or cleared by the [place] FDA and has been authorized for detection and/or diagnosis of SARS-CoV-2 by FDA under an Emergency Use Authorization (EUA). This EUA will remain in effect (meaning this test can be used) for the duration of the COVID-19 declaration under Section 564(b)(1) of the Act, 21 U.S.C. section 360bbb-3(b)(1), unless the authorization is terminated or revoked.  Performed at [facility], [address], [place], [place] [address]    CT ABDOMEN PELVIS W CONTRAST  Result Date: 12/02/2020 CLINICAL DATA:  Abdominal pain.  Concern for diverticulitis. EXAM: CT ABDOMEN AND PELVIS WITH CONTRAST TECHNIQUE: Multidetector CT imaging of the abdomen and pelvis was performed using the standard protocol following bolus administration of intravenous contrast. CONTRAST:  75mL OMNIPAQUE IOHEXOL 350 MG/ML SOLN COMPARISON:  CT abdomen pelvis dated 12/11/2010. FINDINGS: Lower chest: The visualized lung bases are clear. There is mild cardiomegaly. Coronary vascular calcifications. No intra-abdominal free air or free  fluid. Hepatobiliary: Fatty liver. No intrahepatic biliary ductal dilatation. Layering small stones within the gallbladder. No pericholecystic fluid. Pancreas: Unremarkable. No pancreatic ductal dilatation or surrounding inflammatory changes. Spleen: Small scattered calcified splenic granuloma. Adrenals/Urinary Tract: The adrenal glands unremarkable. The kidneys are atrophic. Multiple bilateral renal cysts measuring up to 4 cm in the interpolar right kidney. There is a right lower quadrant renal transplant. There is no hydronephrosis of the transplant kidney. No peritransplant fluid collection. The visualized transplant ureter and urinary bladder appear  unremarkable. Stomach/Bowel: There is sigmoid diverticulosis without active inflammatory changes. There is no bowel obstruction. The appendix is enlarged and inflamed. There is a stone at the base of the appendix. Several additional smaller stones noted in the lumen of the appendix. The appendix measures approximately 17 mm in diameter. There is periappendiceal stranding. The appendix is located in the right lower quadrant and extends posterior, medial, and inferior to the cecum. No evidence of perforation. No abscess. Vascular/Lymphatic: Moderate aortoiliac atherosclerotic disease. The IVC is unremarkable. No portal venous gas. There is no adenopathy. Reproductive: The prostate and seminal vesicles are grossly unremarkable. No pelvic mass. Other: None Musculoskeletal: Degenerative changes of the spine. No acute osseous pathology. IMPRESSION: 1. Acute appendicitis. No abscess or perforation. 2. Sigmoid diverticulosis. 3. Atrophic kidneys with right lower quadrant renal transplant. No hydronephrosis. 4. Cholelithiasis. 5. Aortic Atherosclerosis (ICD10-I70.0). Electronically Signed   By: Anner Crete M.D.   On: 12/02/2020 00:57    Anti-infectives (From admission, onward)    Start     Dose/Rate Route Frequency Ordered Stop   12/02/20 1015  piperacillin-tazobactam (ZOSYN) IVPB 3.375 g        3.375 g 12.5 mL/hr over 240 Minutes Intravenous Every 8 hours 12/02/20 0921     12/02/20 0115  piperacillin-tazobactam (ZOSYN) IVPB 3.375 g        3.375 g 100 mL/hr over 30 Minutes Intravenous  Once 12/02/20 0112 12/02/20 0143       Assessment/Plan Acute Appendicitis  - This is a 76 y.o. male with a hx of prior PD dialysis (2011 - 2013), renal transplant (located in RLQ), CAD w/ hx of MI requiring CABG day after renal transplant, PAF on Coumadin (INR 1.7) who presented with lower abdominal pain and was found to have Acute Appendicitis. On CT there is no perforation or abscess. Appendicolith is noted.  Discussed  with the patient operative versus nonoperative management.  We discussed risks of nonoperative management including but not limited to failure of conservative management requiring surgical intervention and risk of recurrence (which is higher with appendicolith).  I also discussed this with my attending.  The patient favors trial of nonoperative therapy.  Given patient's complex history of prior renal transplant that is located in the right lower quadrant, postoperative MI in the past, MMP which increase risk of anesthesia/surgical complications - I think this is reasonable. My attending agree's. Please leave NPO and continue abx. Repeat labs in am. Continue to hold coumadin incase patient fails nonoperative therapy and requires surgery. Okay for heparin gtt. We will follow with you closely  FEN - NPO, IVF VTE - SCDs, okay for heparin gtt. Hold Coumadin  ID - Zosyn  Elevated LFT's - Patient with noted T. Bili of 3.3 on admission. LFT's are otherwise wnl. He has small stones on CT scan without signs acute cholecystitis. Unclear significance. Will repeat hepatic function panel.   Per [place] MMP including prior ESRD (PD dialysis 2011-2013, renal transplant 2013), CAD s/p CABG, PAF on Coumadin,  Dm2, HTN, OSA, 1st degree heart block   Robert Arnold, [facility] Surgery 12/02/2020, 10:00 AM Please see Amion for pager number during day hours 7:00am-4:30pm

## 2020-12-02 NOTE — Consult Note (Signed)
Cardiology Consult Note:   Patient ID: Robert Arnold MRN: [id number]; DOB: 12-20-1945   Admission date: 12/01/2020  PCP:  [name], MD   [facility] Providers Cardiologist:  Mertie Moores, MD       Chief Complaint:  Preoperative risk stratification : Appendectomy  Patient Profile:   Robert Arnold is a 75 y.o. 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  History of Present Illness:   Robert Arnold was noted to have no cardiac sx in 05/12/20.  Patient was able to ambulate without incidence without CP, SOB.  No syncope.  In 2021 there were not cardiac concerns prior to going through potential C spine fusion (did not end up needing this procedure).  Patient notes that he is feeling   lower abdominal pain for the past two days.  Improved with pain control.  No fevers chills, night sweats. Was 10/10 without pain medication.  Has been told that the appendix is in proximity to his transplant so team is hoping for a conservative strategy  Has had no chest pain, chest pressure, chest tightness, chest stinging .    Patient exertion notable for moderate house work with  and feels no symptoms.  Wife doesn't let him get on a step ladder anymore because of [facility] and he calls his son for strenuous activities. No shortness of breath, DOE .  No PND or orthopnea.  No weight gain, leg swelling , or abdominal swelling.  No syncope or near syncope . Notes  no palpitations or funny heart beats.   No leg claudication.  Has had IV pain medication and CT suggestive of acute appendicitis.  Has been transitioned from coumadin to heparin.    Past Medical History:  Diagnosis Date   Adenomatous colon polyp    Allergy    BPH (benign prostatic hyperplasia)    CHF  (congestive heart failure) ([place])    Chronic kidney disease    due IgA nephropathy - s/p kidnet transplant 09/25/11   Diabetes mellitus type 2, controlled ([place])    FHx: heart disease 02/27/2018   Glaucoma    Gout    Histoplasmosis    on itraconazole for prophylaxis   MI (myocardial infarction) ([place]) 09/26/2011   NSTEMI (non-ST elevated myocardial infarction) ([place]) 11/26/2013   2013    OSA (obstructive sleep apnea)    PAF (paroxysmal atrial fibrillation) ([place])    s/p DC-CV in 6/13. Off coumadin due to ureteral bleed   Testosterone deficiency 02/27/2018    Past Surgical History:  Procedure Laterality Date   ANTERIOR CERVICAL DECOMP/DISCECTOMY FUSION N/A 05/04/2018   Procedure: Cervical Five-Six Anterior cervical decompression/discectomy/fusion;  Surgeon: Jovita Gamma, MD;  Location: [facility];  Service: Neurosurgery;  Laterality: N/A;  Cervical Five-Six Anterior cervical decompression/discectomy/fusion   AV FISTULA PLACEMENT  2011   Left forearm   COLONOSCOPY     CORONARY ANGIOPLASTY WITH STENT PLACEMENT     CORONARY ARTERY BYPASS GRAFT  2013   KIDNEY TRANSPLANT  09/25/2011   PERITONEAL CATHETER INSERTION  2011     Medications Prior to Admission: Prior to Admission medications   Medication Sig Start Date End Date Taking? Authorizing Provider  allopurinol (ZYLOPRIM) 300 MG tablet Take 1/2 tablet Daily  to Prevent  Gout Patient taking differently: Take 150  mg by mouth daily. 11/30/20  Yes Liane Comber, NP  Ascorbic Acid (VITAMIN C) 1000 MG tablet Take 1,000 mg by mouth daily.   Yes [provider]  aspirin EC 81 MG tablet Take 81 mg by mouth daily.   Yes [provider]  atorvastatin (LIPITOR) 40 MG tablet Take     1 tablet     Daily      for Cholesterol Patient taking differently: Take 40 mg by mouth daily. 09/13/20  Yes [name], MD  B Complex-Biotin-FA (B COMPLETE) TABS Take 1 tablet by mouth daily.   Yes [provider]  Black Pepper-Turmeric  (TURMERIC COMPLEX/BLACK PEPPER PO) Take 1,500 mg by mouth daily.   Yes [provider]  buPROPion (WELLBUTRIN XL) 300 MG 24 hr tablet Take  1 tablet  Daily for Mood, Focus & Concentration Patient taking differently: Take 300 mg by mouth daily. 09/20/20  Yes [name], MD  Calcium Carbonate-Vitamin D (OYSTER SHELL CALCIUM/D) 250-125 MG-UNIT TABS Take 1 tablet by mouth daily.   Yes [provider]  finasteride (PROSCAR) 5 MG tablet Take  1 tablet  Daily  for Prostate Patient taking differently: Take 5 mg by mouth daily. 06/28/20  Yes [name], MD  gabapentin (NEURONTIN) 300 MG capsule TAKE ONE CAPSULE BY MOUTH THREE TIMES A DAY AS NEEDED FOR PAIN Patient taking differently: Take 300 mg by mouth at bedtime. 06/19/20  Yes Liane Comber, NP  insulin NPH-regular Human (NOVOLIN 70/30) (70-30) 100 UNIT/ML injection Take 20-30 units 2 x day or as directed Patient taking differently: Inject 12-22 Units into the skin in the morning and at bedtime. 22 units in the AM, 12-15 units in the PM. 02/12/14  Yes [name], MD  latanoprost (XALATAN) 0.005 % ophthalmic solution Place 1 drop into both eyes at bedtime.  04/11/16  Yes [provider]  MAGNESIUM-OXIDE 400 (241.3 Mg) MG tablet Take 400 mg by mouth 2 (two) times daily.  12/29/15  Yes [provider]  mycophenolate (MYFORTIC) 360 MG TBEC EC tablet TAKE 1 TABLET BY MOUTH TWICE DAILY Patient taking differently: Take 360 mg by mouth 2 (two) times daily. 05/06/16  Yes [name], MD  Probiotic Product (PROBIOTIC DAILY PO) Take 1 capsule by mouth daily.   Yes [provider]  tacrolimus (PROGRAF) 0.5 MG capsule Take 0.5 mg by mouth 2 (two) times daily.   Yes [provider]  tacrolimus (PROGRAF) 1 MG capsule Take 1 mg by mouth 2 (two) times daily.   Yes [provider]  traZODone (DESYREL) 50 MG tablet Take  1 tablet  1 hour  before Bedtime  as needed for Sleep Patient taking  differently: Take 50 mg by mouth at bedtime. 07/19/20  Yes [name], MD  VITAMIN D PO Take 5,000-10,000 Units by mouth See admin instructions. Takes 10,000 unit capsules on MWF and 5,000 unit capsule 4 days a week.   Yes [provider]  warfarin (COUMADIN) 5 MG tablet TAKE ONE TO TWO TABLETS BY MOUTH EVERY DAY OR AS DIRECTED Patient taking differently: Take 5 mg by mouth daily. 07/27/20  Yes Liane Comber, NP  Zinc 50 MG CAPS Take 50 mg by mouth daily.   Yes [provider]  glucose blood (FREESTYLE TEST STRIPS) test strip Check blood sugar 3 to 4 times daily for medication regulation. 12/04/15   [name], MD     Allergies:    Allergies  Allergen Reactions   Losartan Anaphylaxis   Crestor [  Rosuvastatin] Other (See Comments)    Elevated LFT's   Lorazepam     Pt is unsure of reaction    Morphine And Related Other (See Comments)    Has no effect on pt     Social History:   Social History   Socioeconomic History   Marital status: Married    Spouse name: Not on file   Number of children: 1   Years of education: Not on file   Highest education level: Not on file  Occupational History   Occupation: retired  Tobacco Use   Smoking status: Former    Packs/day: 2.00    Years: 24.00    Pack years: 48.00    Types: Cigarettes    Start date: 1968    Quit date: 11/29/1990    Years since quitting: 30.0   Smokeless tobacco: Never  Vaping Use   Vaping Use: Never used  Substance and Sexual Activity   Alcohol use: Yes    Comment: 1-2 a month   Drug use: No   Sexual activity: Not on file  Other Topics Concern   Not on file  Social History Narrative   Not on file   Social Determinants of Health   Financial Resource Strain: Not on file  Food Insecurity: Not on file  Transportation Needs: Not on file  Physical Activity: Not on file  Stress: Not on file  Social Connections: Not on file  Intimate Partner Violence: Not on file    Family History:    The patient's family history includes Alcohol abuse in his brother and maternal grandfather; Alcoholism in his brother; Alzheimer's disease in his brother; COPD in his brother and mother; Diabetes in his mother; Heart attack in his father; Kidney disease in his sister. There is no history of Colon cancer, Stomach cancer, Rectal cancer, Esophageal cancer, or Liver cancer.    ROS:  Please see the history of present illness.  All other ROS reviewed and negative.     Physical Exam/Data:   Vitals:   12/02/20 0109 12/02/20 0508 12/02/20 1106 12/02/20 1200  BP: (!) 148/85 117/64 (!) 121/59   Pulse: 86 71 65   Resp: 16 16 19    Temp:  98.6 F (37 C) 98.3 F (36.8 C)   TempSrc:  Oral Oral   SpO2: 96% 97% 97%   Weight:    85.7 kg  Height:    5\' 8"  (1.727 m)   No intake or output data in the 24 hours ending 12/02/20 1236 Last 3 Weights 12/02/2020 11/30/2020 10/30/2020  Weight (lbs) 188 lb 15 oz 189 lb 189 lb  Weight (kg) 85.7 kg 85.73 kg 85.73 kg     Body mass index is 28.73 kg/m.  General:  Well nourished, well developed, in no acute distress HEENT: bilateral Frank's Sign Lymph: no adenopathy Neck: no JVD Endocrine:  No thryomegaly Vascular: No carotid bruits; FA pulses 2+ bilaterally without bruits  Cardiac:  normal S1, S2; RRR; soft systolic crescendo, left forearm fistula has strong bruit; thrill Lungs:  clear to auscultation bilaterally, no wheezing, rhonchi or rales  Abd: soft, nontender, no hepatomegaly  Ext: no edema Musculoskeletal:  No deformities, BUE and BLE strength normal and equal Skin: warm and dry  Neuro:  CNs 2-12 intact, no focal abnormalities noted Psych:  Normal affect    EKG:  The ECG that was done  was personally reviewed and demonstrates sinus bradycardia, Mobitz Type I HB, RBBB and LAFB (Tri-fascicular block) with rare PVCs  Relevant CV Studies:  Transthoracic Echocardiogram: Date: 04/17/2018 Results:Low normal LV function, diastolic dysfunction, LA  indexed volume ~ 32 Mild AS - Left ventricle: The cavity size was normal. There was mild    concentric hypertrophy. Systolic function was normal. The    estimated ejection fraction was in the range of 50% to 55%. Wall    motion was normal; there were no regional wall motion    abnormalities. Features are consistent with a pseudonormal left    ventricular filling pattern, with concomitant abnormal relaxation    and increased filling pressure (grade 2 diastolic dysfunction).    Doppler parameters are consistent with indeteminate ventricular    filling pressure.  - Aortic valve: Valve mobility was restricted. There was mild    stenosis. There was trivial regurgitation. Peak velocity (S): 215    cm/s. Mean gradient (S): 11 mm Hg. Valve area (VTI): 1.07 cm^2.    Valve area (Vmax): 1.1 cm^2. Valve area (Vmean): 1.04 cm^2.  - Mitral valve: Transvalvular velocity was within the normal range.    There was no evidence for stenosis. There was trivial    regurgitation. Valve area by pressure half-time: 2.34 cm^2. Valve    area by continuity equation (using LVOT flow): 1.17 cm^2.  - Right ventricle: The cavity size was normal. Wall thickness was    normal. Systolic function was normal.  - Tricuspid valve: There was mild regurgitation.  - Pulmonary arteries: Systolic pressure was within the normal    range. PA peak pressure: 27 mm Hg (S).     Laboratory Data:  High Sensitivity Troponin:  No results for input(s): TROPONINIHS in the last 720 hours.    Chemistry Recent Labs  Lab 12/01/20 1921  NA 136  K 4.2  CL 100  CO2 24  GLUCOSE 120*  BUN 22  CREATININE 0.97  CALCIUM 9.5  GFRNONAA >60  ANIONGAP 12    Recent Labs  Lab 12/01/20 1921  PROT 6.7  ALBUMIN 4.1  AST 20  ALT 12  ALKPHOS 86  BILITOT 3.3*   Hematology Recent Labs  Lab 11/30/20 1152 12/01/20 1921  WBC 5.8 11.2*  RBC 4.97 4.65  HGB 15.5 14.7  HCT 46.5 43.8  MCV 93.6 94.2  MCH 31.2 31.6  MCHC 33.3 33.6  RDW 12.3  12.8  PLT 140 136*   BNPNo results for input(s): BNP, PROBNP in the last 168 hours.  DDimer No results for input(s): DDIMER in the last 168 hours.   Radiology/Studies:  CT ABDOMEN PELVIS W CONTRAST  Result Date: 12/02/2020 CLINICAL DATA:  Abdominal pain.  Concern for diverticulitis. EXAM: CT ABDOMEN AND PELVIS WITH CONTRAST TECHNIQUE: Multidetector CT imaging of the abdomen and pelvis was performed using the standard protocol following bolus administration of intravenous contrast. CONTRAST:  19mL OMNIPAQUE IOHEXOL 350 MG/ML SOLN COMPARISON:  CT abdomen pelvis dated 12/11/2010. FINDINGS: Lower chest: The visualized lung bases are clear. There is mild cardiomegaly. Coronary vascular calcifications. No intra-abdominal free air or free fluid. Hepatobiliary: Fatty liver. No intrahepatic biliary ductal dilatation. Layering small stones within the gallbladder. No pericholecystic fluid. Pancreas: Unremarkable. No pancreatic ductal dilatation or surrounding inflammatory changes. Spleen: Small scattered calcified splenic granuloma. Adrenals/Urinary Tract: The adrenal glands unremarkable. The kidneys are atrophic. Multiple bilateral renal cysts measuring up to 4 cm in the interpolar right kidney. There is a right lower quadrant renal transplant. There is no hydronephrosis of the transplant kidney. No peritransplant fluid collection. The visualized transplant ureter and urinary bladder appear unremarkable. Stomach/Bowel:  There is sigmoid diverticulosis without active inflammatory changes. There is no bowel obstruction. The appendix is enlarged and inflamed. There is a stone at the base of the appendix. Several additional smaller stones noted in the lumen of the appendix. The appendix measures approximately 17 mm in diameter. There is periappendiceal stranding. The appendix is located in the right lower quadrant and extends posterior, medial, and inferior to the cecum. No evidence of perforation. No abscess.  Vascular/Lymphatic: Moderate aortoiliac atherosclerotic disease. The IVC is unremarkable. No portal venous gas. There is no adenopathy. Reproductive: The prostate and seminal vesicles are grossly unremarkable. No pelvic mass. Other: None Musculoskeletal: Degenerative changes of the spine. No acute osseous pathology. IMPRESSION: 1. Acute appendicitis. No abscess or perforation. 2. Sigmoid diverticulosis. 3. Atrophic kidneys with right lower quadrant renal transplant. No hydronephrosis. 4. Cholelithiasis. 5. Aortic Atherosclerosis (ICD10-I70.0). Electronically Signed   By: Anner Crete M.D.   On: 12/02/2020 00:57     Assessment and Plan:   Preoperative Risk Assessment - The Revised Cardiac Risk Index = 3 (CAD, DM on insulin, Scr >2 prior to transplant), which equates to high estimated risk of perioperative myocardial infarction, pulmonary edema, ventricular fibrillation, cardiac arrest, or complete heart block.  - patient is presently well compensated and asymptomatic from his risk factors; he is optimized for his surgery - DASI score of 19 associated with 5 functional mets - No further cardiac testing is recommended prior to surgery.  - The patient may proceed to surgery at acceptable risk (if necessary)   CAD s/p CABG - will plan to DC on coumadin and no ASA - continue statin  Mobitz Type 1 HB, RBBB and LAFB - asymptomatic; on no AV nodal agents; no indications for PPM; of telemetry without outpatient f/u  PAF - CHADSVASC of 3 - suspect very low burden; on telemetry - post operative heparin-> coumadin; will not need outpatient lovenox bridge unless post operative AF  Mild AS - outpatient surveillance    Will follow up post surgery, if required, possible 12/03/20 sign off.  Risk Assessment/Risk Scores:      CHA2DS2-VASc Score = 3  This indicates a 3.2% annual risk of stroke. The patient's score is based upon: CHF History: No HTN History: No Diabetes History: Yes Stroke  History: No Vascular Disease History: Yes Age Score: 1 Gender Score: 0      For questions or updates, please contact [place] Please consult [email or web address] for contact info under     Signed, Werner Lean, MD  12/02/2020 12:36 PM

## 2020-12-03 DIAGNOSIS — I480 Paroxysmal atrial fibrillation: Secondary | ICD-10-CM | POA: Diagnosis not present

## 2020-12-03 DIAGNOSIS — K3589 Other acute appendicitis without perforation or gangrene: Secondary | ICD-10-CM | POA: Diagnosis not present

## 2020-12-03 LAB — COMPREHENSIVE METABOLIC PANEL
ALT: 15 U/L (ref 0–44)
AST: 19 U/L (ref 15–41)
Albumin: 2.9 g/dL — ABNORMAL LOW (ref 3.5–5.0)
Alkaline Phosphatase: 73 U/L (ref 38–126)
Anion gap: 7 (ref 5–15)
BUN: 15 mg/dL (ref 8–23)
CO2: 25 mmol/L (ref 22–32)
Calcium: 8.9 mg/dL (ref 8.9–10.3)
Chloride: 103 mmol/L (ref 98–111)
Creatinine, Ser: 1.01 mg/dL (ref 0.61–1.24)
GFR, Estimated: >60 mL/min (ref >60)
Glucose, Bld: 105 mg/dL — ABNORMAL HIGH (ref 70–99)
Potassium: 4 mmol/L (ref 3.5–5.1)
Sodium: 135 mmol/L (ref 135–145)
Total Bilirubin: 2.8 mg/dL — ABNORMAL HIGH (ref 0.3–1.2)
Total Protein: 5.5 g/dL — ABNORMAL LOW (ref 6.5–8.1)

## 2020-12-03 LAB — CBC
HCT: 39.7 % (ref 39.0–52.0)
Hemoglobin: 13.2 g/dL (ref 13.0–17.0)
MCH: 31.7 pg (ref 26.0–34.0)
MCHC: 33.2 g/dL (ref 30.0–36.0)
MCV: 95.4 fL (ref 80.0–100.0)
Platelets: 95 10*3/uL — ABNORMAL LOW (ref 150–400)
RBC: 4.16 MIL/uL — ABNORMAL LOW (ref 4.22–5.81)
RDW: 12.8 % (ref 11.5–15.5)
WBC: 7.4 10*3/uL (ref 4.0–10.5)
nRBC: 0 % (ref 0.0–0.2)

## 2020-12-03 LAB — GLUCOSE, CAPILLARY
Glucose-Capillary: 105 mg/dL — ABNORMAL HIGH (ref 70–99)
Glucose-Capillary: 108 mg/dL — ABNORMAL HIGH (ref 70–99)
Glucose-Capillary: 119 mg/dL — ABNORMAL HIGH (ref 70–99)
Glucose-Capillary: 133 mg/dL — ABNORMAL HIGH (ref 70–99)
Glucose-Capillary: 147 mg/dL — ABNORMAL HIGH (ref 70–99)
Glucose-Capillary: 98 mg/dL (ref 70–99)

## 2020-12-03 LAB — HEPARIN LEVEL (UNFRACTIONATED)
Heparin Unfractionated: 0.33 IU/mL (ref 0.30–0.70)
Heparin Unfractionated: 0.59 IU/mL (ref 0.30–0.70)

## 2020-12-03 LAB — PROTIME-INR
INR: 1.5 — ABNORMAL HIGH (ref 0.8–1.2)
Prothrombin Time: 18.2 seconds — ABNORMAL HIGH (ref 11.4–15.2)

## 2020-12-03 LAB — MAGNESIUM: Magnesium: 1.2 mg/dL — ABNORMAL LOW (ref 1.7–2.4)

## 2020-12-03 MED ORDER — HYDROMORPHONE HCL 1 MG/ML IJ SOLN
1.0000 mg | INTRAMUSCULAR | Status: DC | PRN
  Administered 2020-12-03: 1 mg via INTRAVENOUS
  Filled 2020-12-03 (×2): qty 1

## 2020-12-03 MED ORDER — MAGNESIUM SULFATE 4 GM/100ML IV SOLN
4.0000 g | Freq: Once | INTRAVENOUS | Status: AC
  Administered 2020-12-03: 4 g via INTRAVENOUS
  Filled 2020-12-03: qty 100

## 2020-12-03 MED ORDER — FAMOTIDINE 20 MG IN NS 100 ML IVPB
20.0000 mg | INTRAVENOUS | Status: DC
  Administered 2020-12-03: 20 mg via INTRAVENOUS
  Filled 2020-12-03 (×2): qty 100

## 2020-12-03 MED ORDER — OXYCODONE HCL 5 MG PO TABS: 5.0000 mg | ORAL_TABLET | ORAL | Status: DC | PRN

## 2020-12-03 NOTE — Progress Notes (Addendum)
PROGRESS NOTE    Robert Arnold  AUQ:[id number] DOB: 1945/10/07 DOA: 12/01/2020 PCP: [name], MD    Brief Narrative:  Mr. Fulmore was admitted to the hospital with the working diagnosis of acute appendicitis.   75 year old male past medical history for IgA nephropathy s/p renal transplant 2013, type 2 diabetes mellitus, coronary disease, atrial fibrillation and coronary disease who presented abdominal pain.  Reported 24 hours of lower abdominal pain, associated with mild nausea but no vomiting.  Because of severe and persistent pain he presented to the ED at [facility] where he was diagnosed with acute appendicitis.  Transferred to [facility] for further evaluation.  At the time of her transfer blood pressure 148/85, heart rate 86, respiratory rate 16, temperature 98.6, oxygen saturation 96%, his lungs were clear to auscultation bilaterally, heart S1-S2, present, rhythmic, positive systolic murmur, abdomen was soft, tender to palpation in the right lower quadrant/mid lower abdomen, no lower extremity edema.  Sodium 136, potassium 4.2, chloride 100, bicarb 24, glucose 120, BUN 22, creatinine 0.97, AST 20, ALT 12, white count 11.2, hemoglobin 14.7, hematocrit 43.8, platelets 136. SARS COVID-19 negative.  Urinalysis specific gravity 1.029, negative nitrates.  CT of the abdomen and pelvis showing acute appendicitis, no abscess or perforation.  Sigmoid diverticulosis.  Right lower quadrant renal transplant.  EKG 59 bpm, left axis deviation with left anterior fascicular block, normal QTC, right bundle branch block, sinus rhythm with first-degree AV block, PACs and PVCs, no significant ST segment or T wave changes.  Patient was admitted to the medical ward, placed on antibiotic therapy and intravenous fluids. Surgery consultation recommended conservative medical therapy.  Assessment & Plan:   Principal Problem:   Acute appendicitis Active Problems:   Hyperlipidemia associated with  type 2 diabetes mellitus (HCC)   PAF (paroxysmal atrial fibrillation) ([place])   Essential hypertension   Renal Transplant, s/p 09/2011   Depression, major, in remission ([place])   Acute appendicitis (no sepsis). Patient continue to have right lower abdominal pain, but improved from yesterday, no nausea or vomiting.  Wbc is 7,4 and he has remained afebrile.   Plan to continue conservative therapy with antibiotic therapy Zosyn and Iv fluids to keep euvolemic.  For now will keep NPO until further surgery recommendations Continue with as needed antiemetics and analgesics. Patient is medically stable in case of surgery required.   2. Sp renal transplant/ hypomagnesemia.  Renal functio with serum cr at 1.0 with K at 4,0 and serum bicarbonate at 25. Mg is 1,2 this am.  Plan to continue hydration with balanced electrolyte solutions, and add 4 g Mag sulfate IV Follow renal function and electrolytes in am, avoid hypotension and nephrotoxic medications.  Continue with immunosuppressive agents.   3. Paroxysmal atrial fibrillation, second degree AV block mobitz 1, left anterior fascicular block and right bundle branch block. Patient with bradycardia but stable blood pressure, 126 to 638 mmHg systolic.  EKG from today personally reviewed.   Not on AV blockade due to risk of worsening block. Plan to continue telemetry monitoring, correct electrolytes, keep K at 4 and Mg at 2.  Anticoagulation with heparin drip.   4. T2DM. Dyslipidemia. Continue glucose cover and monitoring with insulin sliding scale, patient is NPO for now.  Continue with statin therapy.   5. Depression. Continue with bupropion.   Patient continue to be at high risk for worsening appendicitis.   Status is: Inpatient  Remains inpatient appropriate because:Inpatient level of care appropriate due to severity of illness  Dispo:  The patient is from: Home              Anticipated d/c is to: Home              Patient currently is not  medically stable to d/c.   Difficult to place patient No   DVT prophylaxis: Heparin drip   Code Status:    full  Family Communication:   No family at the bedside      Consultants:  Surgery  Cardiology    Antimicrobials:  IV Zosyn     Subjective: Patient with improved right lower quadrant abdominal pain, but not yet back to baseline, no nausea or vomiting, no chest pain, mild dyspnea (intermittent)   Objective: Vitals:   12/02/20 1710 12/02/20 2016 12/03/20 0001 12/03/20 0417  BP: (!) 126/54 (!) 145/72 (!) 145/65 126/72  Pulse: 64 68 72 73  Resp: 18 18 18 18   Temp: 98.4 F (36.9 C) 98.8 F (37.1 C) 98.8 F (37.1 C) 98.5 F (36.9 C)  TempSrc: Oral Oral Oral Oral  SpO2: 96% 98% 94% 97%  Weight:      Height:        Intake/Output Summary (Last 24 hours) at 12/03/2020 1026 Last data filed at 12/03/2020 0500 Gross per 24 hour  Intake 1801.88 ml  Output --  Net 1801.88 ml   Filed Weights   12/02/20 1200  Weight: 85.7 kg    Examination:   General: Not in pain or dyspnea  Neurology: Awake and alert, non focal  E ENT: mild pallor, no icterus, oral mucosa moist Cardiovascular: No JVD. S1-S2 present, rhythmic, irregular. No lower extremity edema. Pulmonary: positive breath sounds bilaterally, with no wheezing, rhonchi or rales. Gastrointestinal. Abdomen not distended, mild tender to deep palpation at the right lower quadrant, no rebound or guarding Skin. No rashes Musculoskeletal: no joint deformities     Data Reviewed: I have personally reviewed following labs and imaging studies  CBC: Recent Labs  Lab 11/30/20 1152 12/01/20 1921 12/03/20 0705  WBC 5.8 11.2* 7.4  NEUTROABS 3,880  --   --   HGB 15.5 14.7 13.2  HCT 46.5 43.8 39.7  MCV 93.6 94.2 95.4  PLT 140 136* 95*   Basic Metabolic Panel: Recent Labs  Lab 12/01/20 1921 12/03/20 0705  NA 136 135  K 4.2 4.0  CL 100 103  CO2 24 25  GLUCOSE 120* 105*  BUN 22 15  CREATININE 0.97 1.01  CALCIUM  9.5 8.9  MG  --  1.2*   GFR: Estimated Creatinine Clearance: 70.4 mL/min (by C-G formula based on SCr of 1.01 mg/dL). Liver Function Tests: Recent Labs  Lab 12/01/20 1921 12/02/20 1056 12/03/20 0705  AST 20 20 19   ALT 12 14 15   ALKPHOS 86 76 73  BILITOT 3.3* 3.2* 2.8*  PROT 6.7 6.0* 5.5*  ALBUMIN 4.1 3.1* 2.9*   Recent Labs  Lab 12/01/20 1921  LIPASE <10*   No results for input(s): AMMONIA in the last 168 hours. Coagulation Profile: Recent Labs  Lab 11/30/20 1152 12/01/20 2306 12/03/20 0705  INR 2.1* 1.7* 1.5*   Cardiac Enzymes: No results for input(s): CKTOTAL, CKMB, CKMBINDEX, TROPONINI in the last 168 hours. BNP (last 3 results) No results for input(s): PROBNP in the last 8760 hours. HbA1C: No results for input(s): HGBA1C in the last 72 hours. CBG: Recent Labs  Lab 12/02/20 1559 12/02/20 2013 12/03/20 0003 12/03/20 0420 12/03/20 0831  GLUCAP 132* 124* 108* 105* 98   Lipid Profile:  No results for input(s): CHOL, HDL, LDLCALC, TRIG, CHOLHDL, LDLDIRECT in the last 72 hours. Thyroid Function Tests: No results for input(s): TSH, T4TOTAL, FREET4, T3FREE, THYROIDAB in the last 72 hours. Anemia Panel: No results for input(s): VITAMINB12, FOLATE, FERRITIN, TIBC, IRON, RETICCTPCT in the last 72 hours.    Radiology Studies: I have reviewed all of the imaging during this hospital visit personally     Scheduled Meds:  allopurinol  150 mg Oral Daily   atorvastatin  40 mg Oral Daily   buPROPion  300 mg Oral Daily   finasteride  5 mg Oral Daily   gabapentin  300 mg Oral QHS   insulin aspart  0-15 Units Subcutaneous TID WC   latanoprost  1 drop Both Eyes QHS   mycophenolate  360 mg Oral BID   tacrolimus  0.5 mg Oral BID   tacrolimus  1 mg Oral BID   traZODone  50 mg Oral QHS   Continuous Infusions:  heparin 1,450 Units/hr (12/03/20 0800)   lactated ringers 75 mL/hr at 12/02/20 2243   piperacillin-tazobactam (ZOSYN)  IV 3.375 g (12/03/20 0509)      LOS: 1 day        Simonne Boulos Gerome Apley, MD

## 2020-12-03 NOTE — Progress Notes (Signed)
Called pharmacy to verify if Zosyn is compatible with lactated ringer's. Per pharmacy Zosyn is compatible with lactated ringer's if the Zosyn is manufactured by [employer]. This RN confirmed with pharmacy that we are using Zosyn manufactured by [employer].

## 2020-12-03 NOTE — Progress Notes (Signed)
Subjective/Chief Complaint: Much less tender Thirsty Normal WBC Afebrile   Objective: Vital signs in last 24 hours: Temp:  [98.4 F (36.9 C)-98.8 F (37.1 C)] 98.5 F (36.9 C) (08/28 0417) Pulse Rate:  [64-73] 73 (08/28 0417) Resp:  [18] 18 (08/28 0417) BP: (126-145)/(54-72) 126/72 (08/28 0417) SpO2:  [94 %-98 %] 97 % (08/28 0417) Weight:  [85.7 kg] 85.7 kg (08/27 1200)    Intake/Output from previous day: 08/27 0701 - 08/28 0700 In: 1801.9 [I.V.:1701.9; IV Piggyback:100] Out: -  Intake/Output this shift: No intake/output data recorded.  General appearance: alert, cooperative, and no distress Abd - soft, minimal RLQ tenderness  Lab Results:  Recent Labs    12/01/20 1921 12/03/20 0705  WBC 11.2* 7.4  HGB 14.7 13.2  HCT 43.8 39.7  PLT 136* 95*   BMET Recent Labs    12/01/20 1921 12/03/20 0705  NA 136 135  K 4.2 4.0  CL 100 103  CO2 24 25  GLUCOSE 120* 105*  BUN 22 15  CREATININE 0.97 1.01  CALCIUM 9.5 8.9   Hepatic Function Latest Ref Rng & Units 12/03/2020 12/02/2020 12/01/2020  Total Protein 6.5 - 8.1 g/dL 5.5(L) 6.0(L) 6.7  Albumin 3.5 - 5.0 g/dL 2.9(L) 3.1(L) 4.1  AST 15 - 41 U/L _0 ALT 0 - 44 U/L _1 Alk Phosphatase 38 - 126 U/L 73 76 86  Total Bilirubin 0.3 - 1.2 mg/dL 2.8(H) 3.2(H) 3.3(H)  Bilirubin, Direct 0.0 - 0.2 mg/dL - 0.4(H) -    PT/INR Recent Labs    12/01/20 2306 12/03/20 0705  LABPROT 19.8* 18.2*  INR 1.7* 1.5*   ABG No results for input(s): PHART, HCO3 in the last 72 hours.  Invalid input(s): PCO2, PO2  Studies/Results: CT ABDOMEN PELVIS W CONTRAST  Result Date: 12/02/2020 CLINICAL DATA:  Abdominal pain.  Concern for diverticulitis. EXAM: CT ABDOMEN AND PELVIS WITH CONTRAST TECHNIQUE: Multidetector CT imaging of the abdomen and pelvis was performed using the standard protocol following bolus administration of intravenous contrast. CONTRAST:  42m OMNIPAQUE IOHEXOL 350 MG/ML SOLN COMPARISON:  CT abdomen  pelvis dated 12/11/2010. FINDINGS: Lower chest: The visualized lung bases are clear. There is mild cardiomegaly. Coronary vascular calcifications. No intra-abdominal free air or free fluid. Hepatobiliary: Fatty liver. No intrahepatic biliary ductal dilatation. Layering small stones within the gallbladder. No pericholecystic fluid. Pancreas: Unremarkable. No pancreatic ductal dilatation or surrounding inflammatory changes. Spleen: Small scattered calcified splenic granuloma. Adrenals/Urinary Tract: The adrenal glands unremarkable. The kidneys are atrophic. Multiple bilateral renal cysts measuring up to 4 cm in the interpolar right kidney. There is a right lower quadrant renal transplant. There is no hydronephrosis of the transplant kidney. No peritransplant fluid collection. The visualized transplant ureter and urinary bladder appear unremarkable. Stomach/Bowel: There is sigmoid diverticulosis without active inflammatory changes. There is no bowel obstruction. The appendix is enlarged and inflamed. There is a stone at the base of the appendix. Several additional smaller stones noted in the lumen of the appendix. The appendix measures approximately 17 mm in diameter. There is periappendiceal stranding. The appendix is located in the right lower quadrant and extends posterior, medial, and inferior to the cecum. No evidence of perforation. No abscess. Vascular/Lymphatic: Moderate aortoiliac atherosclerotic disease. The IVC is unremarkable. No portal venous gas. There is no adenopathy. Reproductive: The prostate and seminal vesicles are grossly unremarkable. No pelvic mass. Other: None Musculoskeletal: Degenerative changes of the spine. No acute osseous pathology. IMPRESSION: 1. Acute appendicitis. No abscess or  perforation. 2. Sigmoid diverticulosis. 3. Atrophic kidneys with right lower quadrant renal transplant. No hydronephrosis. 4. Cholelithiasis. 5. Aortic Atherosclerosis (ICD10-I70.0). Electronically Signed   By:  Anner Crete M.D.   On: 12/02/2020 00:57    Anti-infectives: Anti-infectives (From admission, onward)    Start     Dose/Rate Route Frequency Ordered Stop   12/02/20 1015  piperacillin-tazobactam (ZOSYN) IVPB 3.375 g        3.375 g 12.5 mL/hr over 240 Minutes Intravenous Every 8 hours 12/02/20 0921     12/02/20 0115  piperacillin-tazobactam (ZOSYN) IVPB 3.375 g        3.375 g 100 mL/hr over 30 Minutes Intravenous  Once 12/02/20 0112 12/02/20 0143       Assessment/Plan: Acute Appendicitis  - Improving on conservative management CAD - hx of MI Renal transplant - renal function stable Anticoagulated   FEN - NPO, IVF VTE - SCDs, okay for heparin gtt. 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   LOS: 1 day    Maia Petties 12/03/2020

## 2020-12-03 NOTE — Progress Notes (Signed)
   12/03/20 1236  Provider Notification  Provider Name/Title Dr. Cathlean Sauer  Date Provider Notified 12/03/20  Time Provider Notified 1236  Notification Type Page  Notification Reason Other (Comment) (Patient refusing insulin do to extreme charges in the past. CBG well controlled.)  Provider response See new orders (OK to DC insulin and change CBG checks to BID PRN)  Date of Provider Response 12/03/20  Time of Provider Response 1236

## 2020-12-03 NOTE — Progress Notes (Signed)
ANTICOAGULATION CONSULT NOTE  Pharmacy Consult for Heparin Indication: atrial fibrillation   Allergies  Allergen Reactions   Losartan Anaphylaxis   Crestor [Rosuvastatin] Other (See Comments)    Elevated LFT's   Lorazepam     Pt is unsure of reaction    Morphine And Related Other (See Comments)    Has no effect on pt     Patient Measurements: Height: 5\' 8"  (172.7 cm) Weight: 85.7 kg (188 lb 15 oz) IBW/kg (Calculated) : 68.4 Heparin Dosing Weight:85.6 kg   Vital Signs: Temp: 98 F (36.7 C) (08/28 1205) Temp Source: Oral (08/28 1205) BP: 137/66 (08/28 1205) Pulse Rate: 61 (08/28 1205)  Labs: Recent Labs    12/01/20 1921 12/01/20 2306 12/02/20 2156 12/03/20 0705 12/03/20 1526  HGB 14.7  --   --  13.2  --   HCT 43.8  --   --  39.7  --   PLT 136*  --   --  95*  --   LABPROT  --  19.8*  --  18.2*  --   INR  --  1.7*  --  1.5*  --   HEPARINUNFRC  --   --  0.19* 0.33 0.59  CREATININE 0.97  --   --  1.01  --      Estimated Creatinine Clearance: 70.4 mL/min (by C-G formula based on SCr of 1.01 mg/dL).   Assessment: 75 yo male presented on 12/01/2020 with lower abdominal pain concerning for appendicitis. Patient is on warfarin prior to admission for Afib. Last dose 8/26. INR 2.1 on admission and now down to 1.5.  Pharmacy asked to bridge with IV heparin.  Heparin level is therapeutic; no bleeding reported.  Goal of Therapy:  Heparin level 0.3-0.7 units/ml Monitor platelets by anticoagulation protocol: Yes   Plan:  Continue heparin infusion at 1450 units/hr  Monitor heparin level, CBC, s/s of bleeding daily  Follow up plans surgery plans and plan to resume oral anticoagulant   Jayline Kilburg D. Mina Marble, PharmD, BCPS, [place] 12/03/2020, 4:18 PM

## 2020-12-03 NOTE — Progress Notes (Signed)
Progress Note  Patient Name: Robert Arnold Date of Encounter: 12/03/2020  Primary Cardiologist: Mertie Moores, MD   Subjective   No events overnight. Abdominal pain has improved.  Inpatient Medications    Scheduled Meds:  allopurinol  150 mg Oral Daily   atorvastatin  40 mg Oral Daily   buPROPion  300 mg Oral Daily   finasteride  5 mg Oral Daily   gabapentin  300 mg Oral QHS   insulin aspart  0-15 Units Subcutaneous TID WC   latanoprost  1 drop Both Eyes QHS   mycophenolate  360 mg Oral BID   tacrolimus  0.5 mg Oral BID   tacrolimus  1 mg Oral BID   traZODone  50 mg Oral QHS   Continuous Infusions:  heparin 1,450 Units/hr (12/03/20 0800)   lactated ringers 75 mL/hr at 12/02/20 2243   piperacillin-tazobactam (ZOSYN)  IV 3.375 g (12/03/20 0509)   PRN Meds: acetaminophen **OR** acetaminophen, fentaNYL (SUBLIMAZE) injection, hydrALAZINE, ondansetron **OR** ondansetron (ZOFRAN) IV, oxyCODONE   Vital Signs    Vitals:   12/02/20 1710 12/02/20 2016 12/03/20 0001 12/03/20 0417  BP: (!) 126/54 (!) 145/72 (!) 145/65 126/72  Pulse: 64 68 72 73  Resp: 18 18 18 18   Temp: 98.4 F (36.9 C) 98.8 F (37.1 C) 98.8 F (37.1 C) 98.5 F (36.9 C)  TempSrc: Oral Oral Oral Oral  SpO2: 96% 98% 94% 97%  Weight:      Height:        Intake/Output Summary (Last 24 hours) at 12/03/2020 1058 Last data filed at 12/03/2020 0500 Gross per 24 hour  Intake 1801.88 ml  Output --  Net 1801.88 ml   Filed Weights   12/02/20 1200  Weight: 85.7 kg    Telemetry    SR 1st HB with RBBB, BIFP - Personally Reviewed  ECG    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   Neck: No JVD Cardiac: RRR, no murmurs, rubs, or gallops.  Respiratory: Clear to auscultation bilaterally. GI: Soft, nontender, non-distended  MS: No edema; No deformity. Neuro:  Nonfocal  Psych: Normal affect   Labs    Chemistry Recent Labs  Lab 12/01/20 1921  12/02/20 1056 12/03/20 0705  NA 136  --  135  K 4.2  --  4.0  CL 100  --  103  CO2 24  --  25  GLUCOSE 120*  --  105*  BUN 22  --  15  CREATININE 0.97  --  1.01  CALCIUM 9.5  --  8.9  PROT 6.7 6.0* 5.5*  ALBUMIN 4.1 3.1* 2.9*  AST 20 20 19   ALT 12 14 15   ALKPHOS 86 76 73  BILITOT 3.3* 3.2* 2.8*  GFRNONAA >60  --  >60  ANIONGAP 12  --  7     Hematology Recent Labs  Lab 11/30/20 1152 12/01/20 1921 12/03/20 0705  WBC 5.8 11.2* 7.4  RBC 4.97 4.65 4.16*  HGB 15.5 14.7 13.2  HCT 46.5 43.8 39.7  MCV 93.6 94.2 95.4  MCH 31.2 31.6 31.7  MCHC 33.3 33.6 33.2  RDW 12.3 12.8 12.8  PLT 140 136* 95*    Cardiac EnzymesNo results for input(s): TROPONINI in the last 168 hours. No results for input(s): TROPIPOC in the last 168 hours.   BNPNo results for input(s): BNP, PROBNP in the last 168 hours.   DDimer No results for input(s): DDIMER in the  last 168 hours.   Radiology    CT ABDOMEN PELVIS W CONTRAST  Result Date: 12/02/2020 CLINICAL DATA:  Abdominal pain.  Concern for diverticulitis. EXAM: CT ABDOMEN AND PELVIS WITH CONTRAST TECHNIQUE: Multidetector CT imaging of the abdomen and pelvis was performed using the standard protocol following bolus administration of intravenous contrast. CONTRAST:  63mL OMNIPAQUE IOHEXOL 350 MG/ML SOLN COMPARISON:  CT abdomen pelvis dated 12/11/2010. FINDINGS: Lower chest: The visualized lung bases are clear. There is mild cardiomegaly. Coronary vascular calcifications. No intra-abdominal free air or free fluid. Hepatobiliary: Fatty liver. No intrahepatic biliary ductal dilatation. Layering small stones within the gallbladder. No pericholecystic fluid. Pancreas: Unremarkable. No pancreatic ductal dilatation or surrounding inflammatory changes. Spleen: Small scattered calcified splenic granuloma. Adrenals/Urinary Tract: The adrenal glands unremarkable. The kidneys are atrophic. Multiple bilateral renal cysts measuring up to 4 cm in the interpolar right  kidney. There is a right lower quadrant renal transplant. There is no hydronephrosis of the transplant kidney. No peritransplant fluid collection. The visualized transplant ureter and urinary bladder appear unremarkable. Stomach/Bowel: There is sigmoid diverticulosis without active inflammatory changes. There is no bowel obstruction. The appendix is enlarged and inflamed. There is a stone at the base of the appendix. Several additional smaller stones noted in the lumen of the appendix. The appendix measures approximately 17 mm in diameter. There is periappendiceal stranding. The appendix is located in the right lower quadrant and extends posterior, medial, and inferior to the cecum. No evidence of perforation. No abscess. Vascular/Lymphatic: Moderate aortoiliac atherosclerotic disease. The IVC is unremarkable. No portal venous gas. There is no adenopathy. Reproductive: The prostate and seminal vesicles are grossly unremarkable. No pelvic mass. Other: None Musculoskeletal: Degenerative changes of the spine. No acute osseous pathology. IMPRESSION: 1. Acute appendicitis. No abscess or perforation. 2. Sigmoid diverticulosis. 3. Atrophic kidneys with right lower quadrant renal transplant. No hydronephrosis. 4. Cholelithiasis. 5. Aortic Atherosclerosis (ICD10-I70.0). Electronically Signed   By: Anner Crete M.D.   On: 12/02/2020 00:57      Patient Profile     75 y.o. male with CAD s/p CABG with possible appendectomy  Assessment & Plan    Preoperative Risk Assessment - The Revised Cardiac Risk Index = 3 (CAD, DM on insulin, Scr >2 prior to transplant), which equates to high estimated risk of perioperative myocardial infarction, pulmonary edema, ventricular fibrillation, cardiac arrest, or complete heart block.  - patient is presently well compensated and asymptomatic from his risk factors; he is optimized for his surgery - DASI score of 19 associated with 5 functional mets - No further cardiac testing is  recommended prior to surgery.  - The patient may proceed to surgery at acceptable risk (if necessary); it appears there is no plans for surgery at this time    CAD s/p CABG - will plan to DC on coumadin and no ASA when surgical plan is in place - continue statin   [place] Type 1 HB, RBBB and LAFB - asymptomatic; on no AV nodal agents; no indications for PPM; of telemetry without outpatient f/u   PAF - CHADSVASC of 3 - suspect very low burden; on telemetry - post operative heparin-> coumadin; will not need outpatient lovenox bridge unless post operative AF   Mild AS - outpatient surveillance   [facility] will sign off.   Medication Recommendations:  Coumadin no plavix Other recommendations (labs, testing, etc):  Return to his coumadin clinic Follow up as an outpatient:  Has follow up in 2023 with no cardiac excacerbation; will  keep this follow up unless new issues occur  For questions or updates, please contact [place] Please consult [email or web address] for contact info under Cardiology/STEMI.      Signed, Werner Lean, MD  12/03/2020, 10:58 AM

## 2020-12-03 NOTE — Progress Notes (Signed)
Patient with loss IV acces of right AC, unable to stop bleeding for almost 2 hours pos d/t  hep gtt with lots of pressure. Dr. Cathlean Sauer notified of issue. Stop Hepain gtt per MD. Verified to stop heparin gtt order with RN Joycelyn Das. Report given to oncoming RN.

## 2020-12-03 NOTE — Progress Notes (Signed)
ANTICOAGULATION CONSULT NOTE  Pharmacy Consult for Heparin Indication: atrial fibrillation while warfarin on hold  Allergies  Allergen Reactions   Losartan Anaphylaxis   Crestor [Rosuvastatin] Other (See Comments)    Elevated LFT's   Lorazepam     Pt is unsure of reaction    Morphine And Related Other (See Comments)    Has no effect on pt     Patient Measurements: Height: 5\' 8"  (172.7 cm) Weight: 85.7 kg (188 lb 15 oz) IBW/kg (Calculated) : 68.4 Heparin Dosing Weight:85.6 kg   Vital Signs: Temp: 98.5 F (36.9 C) (08/28 0417) Temp Source: Oral (08/28 0417) BP: 126/72 (08/28 0417) Pulse Rate: 73 (08/28 0417)  Labs: Recent Labs    11/30/20 1152 12/01/20 1921 12/01/20 2306 12/02/20 2156 12/03/20 0705  HGB 15.5 14.7  --   --  13.2  HCT 46.5 43.8  --   --  39.7  PLT 140 136*  --   --  95*  LABPROT 20.6*  --  19.8*  --  18.2*  INR 2.1*  --  1.7*  --  1.5*  HEPARINUNFRC  --   --   --  0.19* 0.33  CREATININE  --  0.97  --   --  1.01     Estimated Creatinine Clearance: 70.4 mL/min (by C-G formula based on SCr of 1.01 mg/dL).   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 Off coumadin due to ureteral bleed   Testosterone deficiency 02/27/2018     Assessment: 75 yo male presented on 12/01/2020 with lower abdominal pain concerning for appendicitis. Patient is on warfarin prior to admission  for Afib. Last dose 8/26. INR 2.1 on admission and down to 1.5  Heparin level of 0.33 is therapeutic on heparin 1450 units/hr. Hgb 13.2. Plt 95. No bleeding noted.   Goal of Therapy:  Heparin level 0.3-0.7 units/ml Monitor platelets by anticoagulation protocol: Yes   Plan:  Continue heparin 1450 units/hr  Check 8 hr heparin level  Watch plts closely Monitor heparin level, CBC, s/s of bleeding daily  Follow up plans surgery plans and plan to resume oral anticoagulant   Cristela Felt, PharmD, BCPS Clinical Pharmacist 12/03/2020 8:33 AM

## 2020-12-03 NOTE — Progress Notes (Signed)
A consult was placed to IV Therapy to restart a new iv site;  pt limited to Right arm only for ivs, labs; new iv started easily on 1st attempt in the right upper arm w ultrasound;  pt on heparin drip, and noted to have continuous oozing from a previous iv site that was in his right ac; pressure held, and gauze pressure drsg was changed x 2; RN  was notified and Rapid Response at bedside at present;

## 2020-12-04 LAB — HEPATIC FUNCTION PANEL
ALT: 14 U/L (ref 0–44)
AST: 19 U/L (ref 15–41)
Albumin: 2.7 g/dL — ABNORMAL LOW (ref 3.5–5.0)
Alkaline Phosphatase: 65 U/L (ref 38–126)
Bilirubin, Direct: 0.4 mg/dL — ABNORMAL HIGH (ref 0.0–0.2)
Indirect Bilirubin: 1.6 mg/dL — ABNORMAL HIGH (ref 0.3–0.9)
Total Bilirubin: 2 mg/dL — ABNORMAL HIGH (ref 0.3–1.2)
Total Protein: 5.5 g/dL — ABNORMAL LOW (ref 6.5–8.1)

## 2020-12-04 LAB — GLUCOSE, CAPILLARY
Glucose-Capillary: 101 mg/dL — ABNORMAL HIGH (ref 70–99)
Glucose-Capillary: 120 mg/dL — ABNORMAL HIGH (ref 70–99)
Glucose-Capillary: 149 mg/dL — ABNORMAL HIGH (ref 70–99)
Glucose-Capillary: 186 mg/dL — ABNORMAL HIGH (ref 70–99)

## 2020-12-04 LAB — BASIC METABOLIC PANEL
Anion gap: 10 (ref 5–15)
BUN: 14 mg/dL (ref 8–23)
CO2: 25 mmol/L (ref 22–32)
Calcium: 9.4 mg/dL (ref 8.9–10.3)
Chloride: 102 mmol/L (ref 98–111)
Creatinine, Ser: 1 mg/dL (ref 0.61–1.24)
GFR, Estimated: >60 mL/min (ref >60)
Glucose, Bld: 102 mg/dL — ABNORMAL HIGH (ref 70–99)
Potassium: 3.7 mmol/L (ref 3.5–5.1)
Sodium: 137 mmol/L (ref 135–145)

## 2020-12-04 LAB — CBC
HCT: 37.5 % — ABNORMAL LOW (ref 39.0–52.0)
Hemoglobin: 12.5 g/dL — ABNORMAL LOW (ref 13.0–17.0)
MCH: 31.6 pg (ref 26.0–34.0)
MCHC: 33.3 g/dL (ref 30.0–36.0)
MCV: 94.7 fL (ref 80.0–100.0)
Platelets: 101 10*3/uL — ABNORMAL LOW (ref 150–400)
RBC: 3.96 MIL/uL — ABNORMAL LOW (ref 4.22–5.81)
RDW: 12.7 % (ref 11.5–15.5)
WBC: 5.5 10*3/uL (ref 4.0–10.5)
nRBC: 0 % (ref 0.0–0.2)

## 2020-12-04 LAB — MAGNESIUM: Magnesium: 1.6 mg/dL — ABNORMAL LOW (ref 1.7–2.4)

## 2020-12-04 LAB — PROTIME-INR
INR: 1.2 (ref 0.8–1.2)
Prothrombin Time: 15.4 seconds — ABNORMAL HIGH (ref 11.4–15.2)

## 2020-12-04 MED ORDER — FAMOTIDINE 20 MG PO TABS
20.0000 mg | ORAL_TABLET | Freq: Every day | ORAL | Status: DC
  Administered 2020-12-04 – 2020-12-05 (×2): 20 mg via ORAL
  Filled 2020-12-04 (×2): qty 1

## 2020-12-04 MED ORDER — AMOXICILLIN-POT CLAVULANATE 875-125 MG PO TABS
1.0000 | ORAL_TABLET | Freq: Two times a day (BID) | ORAL | Status: DC
  Administered 2020-12-04 – 2020-12-05 (×3): 1 via ORAL
  Filled 2020-12-04 (×3): qty 1

## 2020-12-04 MED ORDER — POTASSIUM CHLORIDE CRYS ER 20 MEQ PO TBCR
40.0000 meq | EXTENDED_RELEASE_TABLET | Freq: Once | ORAL | Status: AC
  Administered 2020-12-04: 40 meq via ORAL
  Filled 2020-12-04: qty 2

## 2020-12-04 MED ORDER — MAGNESIUM SULFATE 2 GM/50ML IV SOLN
2.0000 g | Freq: Once | INTRAVENOUS | Status: AC
  Administered 2020-12-04: 2 g via INTRAVENOUS
  Filled 2020-12-04: qty 50

## 2020-12-04 NOTE — Progress Notes (Signed)
PROGRESS NOTE    Robert Arnold  OHY:[id number] DOB: 12-01-1945 DOA: 12/01/2020 PCP: Robert Pinto, MD    Brief Narrative:  Robert Arnold was admitted to the hospital with the working diagnosis of acute appendicitis.    75 year old male past medical history for IgA nephropathy s/p renal transplant 2013, type 2 diabetes mellitus, coronary disease, atrial fibrillation and coronary disease who presented abdominal pain.  Reported 24 hours of lower abdominal pain, associated with mild nausea but no vomiting.  Because of severe and persistent pain he presented to the ED at [facility] where he was diagnosed with acute appendicitis.  Transferred to [facility] for further evaluation.  At the time of her transfer blood pressure 148/85, heart rate 86, respiratory rate 16, temperature 98.6, oxygen saturation 96%, his lungs were clear to auscultation bilaterally, heart S1-S2, present, rhythmic, positive systolic murmur, abdomen was soft, tender to palpation in the right lower quadrant/mid lower abdomen, no lower extremity edema.   Sodium 136, potassium 4.2, chloride 100, bicarb 24, glucose 120, BUN 22, creatinine 0.97, AST 20, ALT 12, white count 11.2, hemoglobin 14.7, hematocrit 43.8, platelets 136. SARS COVID-19 negative.   Urinalysis specific gravity 1.029, negative nitrates.   CT of the abdomen and pelvis showing acute appendicitis, no abscess or perforation.  Sigmoid diverticulosis.  Right lower quadrant renal transplant.   EKG 59 bpm, left axis deviation with left anterior fascicular block, normal QTC, right bundle branch block, sinus rhythm with first-degree AV block, PACs and PVCs, no significant ST segment or T wave changes.   Patient was admitted to the medical ward, placed on antibiotic therapy and intravenous fluids. Surgery consultation recommended conservative medical therapy.  Patient had severe bleeding from his right upper extremity at the site of IV access, heparin was discontinued.     Assessment & Plan:   Principal Problem:   Acute appendicitis Active Problems:   Hyperlipidemia associated with type 2 diabetes mellitus (HCC)   PAF (paroxysmal atrial fibrillation) ([place])   Essential hypertension   Renal Transplant, s/p 09/2011   Depression, major, in remission ([place])   Acute appendicitis (no sepsis).  Patient with improvement in his abdominal pan, has been tolerating well liquids, with no nausea or vomiting. Wbc today is 5,5 and he has remained afebrile.   Continue as needed analgesics and antiemetics. Antiacid therapy (famotidine) and oral antibiotic therapy. Follow up cell count in am, and continue to monitor abdominal symptoms. If continue to improve possible dc home in am.  Today with full liquids diet, discontinue IV fluids.    2. Sp renal transplant/ hypomagnesemia/ hypokalemia  Patient tolerating po well, renal function with serum cr at 1,0 with K at 3,7 and serum bicarbonate at 25. Mg is 1,6.  Plan to add 40 meq of Kcl and 2 g Mag sulfate, follow up with electrolytes in am, avoid hypotension and nephrotoxic medications.    On immunosuppressive agents.    3. Paroxysmal atrial fibrillation, second degree AV block mobitz 1, left anterior fascicular block and right bundle branch block. His heart rate has been well controlled in the 60;s Heparin was discontinue due severe bleeding from IV access site.  Plan to resume anticoagulation in am with warfarin, follow INR per pharmacy protocol.  Today INR is 1,2    4. T2DM. Dyslipidemia.  Fasting glucose is 102, capillary 120 and 101. Patient is tolerating po well, continue capillary glucose monitoring. Holding on insulin therapy for now.   Continue with atorvastatin.,    5. Depression. On bupropion.  6. Gout. Not acute flare, continue with allopurinol.   Status is: Inpatient  Remains inpatient appropriate because:Inpatient level of care appropriate due to severity of illness  Dispo: The patient is  from: Home              Anticipated d/c is to: Home possible dc home in am, if continue to improve.               Patient currently is not medically stable to d/c.   Difficult to place patient No   DVT prophylaxis: Scd   Code Status:    full  Family Communication:   No family at the bedside      Consultants:  Surgery    Antimicrobials:  Zosyn> Augmentin     Subjective: Patient is out of bed to the chair, ambulating well, no nausea or vomiting, abdominal pain continue to improve, no dyspnea or chest pain.   Objective: Vitals:   12/03/20 1205 12/03/20 1653 12/04/20 0000 12/04/20 0418  BP: 137/66 136/78 121/62 137/69  Pulse: 61 62 69 63  Resp: 18 18 18 18   Temp: 98 F (36.7 C) 98 F (36.7 C) 97.7 F (36.5 C) 98.3 F (36.8 C)  TempSrc: Oral Oral Oral Oral  SpO2: 98% 96% 96% 95%  Weight:      Height:        Intake/Output Summary (Last 24 hours) at 12/04/2020 1057 Last data filed at 12/04/2020 0500 Gross per 24 hour  Intake 2132.14 ml  Output --  Net 2132.14 ml   Filed Weights   12/02/20 1200  Weight: 85.7 kg    Examination:   General: Not in pain or dyspnea, deconditioned  Neurology: Awake and alert, non focal  E ENT: no pallor, no icterus, oral mucosa moist Cardiovascular: No JVD. S1-S2 present, rhythmic, no gallops, rubs, or murmurs. No lower extremity edema. Pulmonary: positive breath sounds bilaterally, adequate air movement, no wheezing, rhonchi or rales. Gastrointestinal. Abdomen soft and non tender to superficial palpation Skin. Right forearm ecchymosis with no active bleeding.  Musculoskeletal: no joint deformities     Data Reviewed: I have personally reviewed following labs and imaging studies  CBC: Recent Labs  Lab 11/30/20 1152 12/01/20 1921 12/03/20 0705 12/04/20 0517  WBC 5.8 11.2* 7.4 5.5  NEUTROABS 3,880  --   --   --   HGB 15.5 14.7 13.2 12.5*  HCT 46.5 43.8 39.7 37.5*  MCV 93.6 94.2 95.4 94.7  PLT 140 136* 95* 101*   Basic  Metabolic Panel: Recent Labs  Lab 12/01/20 1921 12/03/20 0705 12/04/20 0517  NA 136 135 137  K 4.2 4.0 3.7  CL 100 103 102  CO2 24 25 25   GLUCOSE 120* 105* 102*  BUN 22 15 14   CREATININE 0.97 1.01 1.00  CALCIUM 9.5 8.9 9.4  MG  --  1.2* 1.6*   GFR: Estimated Creatinine Clearance: 71.1 mL/min (by C-G formula based on SCr of 1 mg/dL). Liver Function Tests: Recent Labs  Lab 12/01/20 1921 12/02/20 1056 12/03/20 0705 12/04/20 0517  AST 20 20 19 19   ALT 12 14 15 14   ALKPHOS 86 76 73 65  BILITOT 3.3* 3.2* 2.8* 2.0*  PROT 6.7 6.0* 5.5* 5.5*  ALBUMIN 4.1 3.1* 2.9* 2.7*   Recent Labs  Lab 12/01/20 1921  LIPASE <10*   No results for input(s): AMMONIA in the last 168 hours. Coagulation Profile: Recent Labs  Lab 11/30/20 1152 12/01/20 2306 12/03/20 0705 12/04/20 0517  INR 2.1* 1.7* 1.5*  1.2   Cardiac Enzymes: No results for input(s): CKTOTAL, CKMB, CKMBINDEX, TROPONINI in the last 168 hours. BNP (last 3 results) No results for input(s): PROBNP in the last 8760 hours. HbA1C: No results for input(s): HGBA1C in the last 72 hours. CBG: Recent Labs  Lab 12/03/20 1144 12/03/20 1644 12/03/20 2018 12/04/20 0002 12/04/20 0415  GLUCAP 133* 147* 119* 120* 101*   Lipid Profile: No results for input(s): CHOL, HDL, LDLCALC, TRIG, CHOLHDL, LDLDIRECT in the last 72 hours. Thyroid Function Tests: No results for input(s): TSH, T4TOTAL, FREET4, T3FREE, THYROIDAB in the last 72 hours. Anemia Panel: No results for input(s): VITAMINB12, FOLATE, FERRITIN, TIBC, IRON, RETICCTPCT in the last 72 hours.    Radiology Studies: I have reviewed all of the imaging during this hospital visit personally     Scheduled Meds:  allopurinol  150 mg Oral Daily   amoxicillin-clavulanate  1 tablet Oral Q12H   atorvastatin  40 mg Oral Daily   buPROPion  300 mg Oral Daily   famotidine  20 mg Oral Daily   finasteride  5 mg Oral Daily   gabapentin  300 mg Oral QHS   latanoprost  1 drop  Both Eyes QHS   mycophenolate  360 mg Oral BID   potassium chloride  40 mEq Oral Once   tacrolimus  0.5 mg Oral BID   tacrolimus  1 mg Oral BID   traZODone  50 mg Oral QHS   Continuous Infusions:  lactated ringers 75 mL/hr at 12/03/20 1136   magnesium sulfate bolus IVPB       LOS: 2 days        Faustina Gebert Gerome Apley, MD

## 2020-12-04 NOTE — Progress Notes (Addendum)
Subjective: CC: Doing well. Reports that he has 1/10 RLQ pain. Tolerating cld but doesn't like options. No n/v. Got oob yesterday. Voiding. No BM.   Objective: Vital signs in last 24 hours: Temp:  [97.7 F (36.5 C)-98.3 F (36.8 C)] 98.3 F (36.8 C) (08/29 0418) Pulse Rate:  [61-69] 63 (08/29 0418) Resp:  [18] 18 (08/29 0418) BP: (121-137)/(62-78) 137/69 (08/29 0418) SpO2:  [95 %-98 %] 95 % (08/29 0418) Last BM Date: 11/30/20  Intake/Output from previous day: 08/28 0701 - 08/29 0700 In: 2132.1 [I.V.:1882.1; IV Piggyback:250] Out: -  Intake/Output this shift: No intake/output data recorded.  PE: Gen:  Alert, NAD, pleasant Pulm:  normal rate and effort  Abd: Soft, ND, minimal RLQ tenderness, +BS Psych: A&Ox3  Skin: no rashes noted, warm and dry  Lab Results:  Recent Labs    12/03/20 0705 12/04/20 0517  WBC 7.4 5.5  HGB 13.2 12.5*  HCT 39.7 37.5*  PLT 95* 101*   BMET Recent Labs    12/03/20 0705 12/04/20 0517  NA 135 137  K 4.0 3.7  CL 103 102  CO2 25 25  GLUCOSE 105* 102*  BUN 15 14  CREATININE 1.01 1.00  CALCIUM 8.9 9.4   PT/INR Recent Labs    12/03/20 0705 12/04/20 0517  LABPROT 18.2* 15.4*  INR 1.5* 1.2   CMP     Component Value Date/Time   NA 137 12/04/2020 0517   NA 141 03/15/2013 0744   K 3.7 12/04/2020 0517   CL 102 12/04/2020 0517   CO2 25 12/04/2020 0517   GLUCOSE 102 (H) 12/04/2020 0517   BUN 14 12/04/2020 0517   BUN 16 03/15/2013 0744   CREATININE 1.00 12/04/2020 0517   CREATININE 1.12 05/16/2020 1505   CALCIUM 9.4 12/04/2020 0517   PROT 5.5 (L) 12/03/2020 0705   PROT 6.1 03/15/2013 0744   ALBUMIN 2.9 (L) 12/03/2020 0705   ALBUMIN 4.3 03/15/2013 0744   AST 19 12/03/2020 0705   ALT 15 12/03/2020 0705   ALKPHOS 73 12/03/2020 0705   BILITOT 2.8 (H) 12/03/2020 0705   GFRNONAA >60 12/04/2020 0517   GFRNONAA 65 05/16/2020 1505   GFRAA 76 05/16/2020 1505   Lipase     Component Value Date/Time   LIPASE <10 (L)  12/01/2020 1921    Studies/Results: No results found.  Anti-infectives: Anti-infectives (From admission, onward)    Start     Dose/Rate Route Frequency Ordered Stop   12/02/20 1015  piperacillin-tazobactam (ZOSYN) IVPB 3.375 g        3.375 g 12.5 mL/hr over 240 Minutes Intravenous Every 8 hours 12/02/20 0921     12/02/20 0115  piperacillin-tazobactam (ZOSYN) IVPB 3.375 g        3.375 g 100 mL/hr over 30 Minutes Intravenous  Once 12/02/20 0112 12/02/20 0143        Assessment/Plan Acute Appendicitis  - Continues to improve on conservative management. Afebrile. WBC wnl (5.5), symptomatically improved with 1/10 RLQ pain, tolerating cld w/o n/v and minimal rlq tenderness on exam. Will switch to PO abx and adv diet. Possible d/c tomorrow AM if doing well.    FEN - FLD > Reg if tolerates, IVF per [facility] VTE - SCDs, okay for heparin gtt. Hold Coumadin  ID - Zosyn > Augmentin   Hyperbilirubinemia - Patient with noted T. Bili of 3.3 on admission. LFT's are otherwise wnl. Downtrending on yesterdays labs. He had small stones on CT scan without signs acute cholecystitis.  No RUQ tenderness on todays exam. Unclear significance. Will continue to monitor and repeat hepatic function panel today.    Per [facility] Hx ESRD (PD dialysis 2011-2013, renal transplant 2013 currently on immunosuppressive medications) - kidney function wnl CAD s/p CABG - cards has seen and reports acceptable risk for surgery if needed with no further workup recommended PAF on Coumadin at home - INR 1.2 Dm2 HTN OSA 1st degree heart block    LOS: 2 days    Jillyn Ledger , [facility] Surgery 12/04/2020, 8:13 AM Please see Amion for pager number during day hours 7:00am-4:30pm

## 2020-12-05 LAB — CBC
HCT: 39.5 % (ref 39.0–52.0)
Hemoglobin: 13.3 g/dL (ref 13.0–17.0)
MCH: 31.7 pg (ref 26.0–34.0)
MCHC: 33.7 g/dL (ref 30.0–36.0)
MCV: 94.3 fL (ref 80.0–100.0)
Platelets: 118 10*3/uL — ABNORMAL LOW (ref 150–400)
RBC: 4.19 MIL/uL — ABNORMAL LOW (ref 4.22–5.81)
RDW: 12.7 % (ref 11.5–15.5)
WBC: 4 10*3/uL (ref 4.0–10.5)
nRBC: 0 % (ref 0.0–0.2)

## 2020-12-05 LAB — BASIC METABOLIC PANEL
Anion gap: 8 (ref 5–15)
BUN: 13 mg/dL (ref 8–23)
CO2: 26 mmol/L (ref 22–32)
Calcium: 9.4 mg/dL (ref 8.9–10.3)
Chloride: 105 mmol/L (ref 98–111)
Creatinine, Ser: 0.95 mg/dL (ref 0.61–1.24)
GFR, Estimated: >60 mL/min (ref >60)
Glucose, Bld: 113 mg/dL — ABNORMAL HIGH (ref 70–99)
Potassium: 3.8 mmol/L (ref 3.5–5.1)
Sodium: 139 mmol/L (ref 135–145)

## 2020-12-05 LAB — GLUCOSE, CAPILLARY
Glucose-Capillary: 90 mg/dL (ref 70–99)
Glucose-Capillary: 162 mg/dL — ABNORMAL HIGH (ref 70–99)

## 2020-12-05 LAB — MAGNESIUM: Magnesium: 2 mg/dL (ref 1.7–2.4)

## 2020-12-05 LAB — PROTIME-INR
INR: 1.2 (ref 0.8–1.2)
Prothrombin Time: 15.1 seconds (ref 11.4–15.2)

## 2020-12-05 MED ORDER — AMOXICILLIN-POT CLAVULANATE 875-125 MG PO TABS: 1.0000 | ORAL_TABLET | Freq: Two times a day (BID) | ORAL | 0 refills | Status: DC

## 2020-12-05 MED ORDER — MAGNESIUM SULFATE 2 GM/50ML IV SOLN
2.0000 g | Freq: Once | INTRAVENOUS | Status: AC
  Administered 2020-12-05: 2 g via INTRAVENOUS
  Filled 2020-12-05: qty 50

## 2020-12-05 NOTE — Discharge Summary (Signed)
Physician Discharge Summary  Robert Arnold OFB:[id number] DOB: Oct 30, 1945 DOA: 12/01/2020  PCP: [name], MD  Admit date: 12/01/2020  Discharge date: 12/05/2020  Admitted From: Home.  Disposition:  Home.  Recommendations for Outpatient Follow-up:  Follow up with PCP in 1-2 weeks. Please obtain BMP/CBC in one week. Advised to follow-up with renal transplant team to discuss about interval appendicectomy as this has resolved with IV antibiotics. Advised to take Augmentin twice a day for 7 days.  Home Health: None Equipment/Devices: None  Discharge Condition: Good CODE STATUS:DNR Diet recommendation: Heart Healthy   Brief Summary/ Hospital Course: This 75 year old male with PMH significant for IgA nephropathy s/p renal transplant in 2013, type 2 diabetes mellitus, atrial fibrillation and coronary artery disease who presented in the ED with acute abdominal pain. He reported 24 hours of lower abdominal pain, associated with mild nausea but no vomiting.CT of the abdomen and pelvis showing acute appendicitis, No abscess or perforation.  Sigmoid diverticulosis.  Right lower quadrant renal transplant. Patient was admitted in [facility] for acute appendicitis, General surgery was consulted, recommended conservative management given high risk.  Cardiology was consulted for preop clearance.  Patient was cleared with moderate to high risk.  Patient was managed with IV fluids, IV antibiotics.  Patient remained afebrile, leukocytosis resolved abdominal pain has significantly improved with conservative management.  Patient has tolerated soft diet.  Patient is cleared from general surgery to be discharged on oral antibiotics for 7 days.  Patient was explained that he should contact his transplant team to discuss about interval appendicectomy since this is resolved with IV antibiotics at this time.  Patient understood and patient is being discharged home on Augmentin for 7 days.  He was managed for  below problems.   Discharge Diagnoses:  Principal Problem:   Acute appendicitis Active Problems:   Hyperlipidemia associated with type 2 diabetes mellitus (HCC)   PAF (paroxysmal atrial fibrillation) ([place])   Essential hypertension   Renal Transplant, s/p 09/2011   Depression, major, in remission ([place])  Acute appendicitis (no sepsis).  Patient with improvement in his abdominal pan, has been tolerating full liquids, with no nausea or vomiting. Wbc today is 5.5 and he has remained afebrile.  Continue as needed analgesics and antiemetics. Antiacid therapy (famotidine) and oral antibiotic therapy. Patient remained afebrile, leukocytosis resolved,  abdominal pain has improved,  cleared from general surgery to be discharged.   Sp renal transplant/ hypomagnesemia/ hypokalemia  Electrolytes improved, serum creatinine back to baseline. On immunosuppressive agents.    Paroxysmal atrial fibrillation, second degree AV block mobitz 1, left anterior fascicular block and right bundle branch block. His heart rate has been well controlled in the 60;s Heparin was discontinued due severe bleeding from IV access site. Plan to resume anticoagulation in am with warfarin, follow INR per pharmacy protocol.  Today INR is 1.2  , no need for bridging with Lovenox or heparin.  As per cardiology   T2DM. Dyslipidemia.  Fasting glucose is 102, capillary 120 and 101. Patient is tolerating po well, continue capillary glucose monitoring. Holding on insulin therapy for now.  Continue with atorvastatin.,    Depression: Continue bupropion   Gout. Not acute flare, continue with allopurinol.     Discharge Instructions  Discharge Instructions     Call MD for:  difficulty breathing, headache or visual disturbances   Complete by: As directed    Call MD for:  persistant dizziness or light-headedness   Complete by: As directed    Call MD  for:  persistant nausea and vomiting   Complete by: As directed    Call MD  for:  severe uncontrolled pain   Complete by: As directed    Diet - low sodium heart healthy   Complete by: As directed    Diet Carb Modified   Complete by: As directed    Discharge instructions   Complete by: As directed    Advised to follow with primary care physician in 1 week. Advised to follow-up with renal transplant team to discuss about interval appendicectomy as this is resolved with IV antibiotics. Advised to take Augmentin twice a day for 7 days.   Increase activity slowly   Complete by: As directed       Allergies as of 12/05/2020       Reactions   Losartan Anaphylaxis   Crestor [rosuvastatin] Other (See Comments)   Elevated LFT's   Lorazepam    Pt is unsure of reaction    Morphine And Related Other (See Comments)   Has no effect on pt         Medication List     TAKE these medications    allopurinol 300 MG tablet Commonly known as: ZYLOPRIM Take 1/2 tablet Daily  to Prevent  Gout What changed:  how much to take how to take this when to take this additional instructions   amoxicillin-clavulanate 875-125 MG tablet Commonly known as: AUGMENTIN Take 1 tablet by mouth every 12 (twelve) hours for 7 days.   aspirin EC 81 MG tablet Take 81 mg by mouth daily.   atorvastatin 40 MG tablet Commonly known as: LIPITOR Take     1 tablet     Daily      for Cholesterol What changed:  how much to take how to take this when to take this additional instructions   B Complete Tabs Take 1 tablet by mouth daily.   buPROPion 300 MG 24 hr tablet Commonly known as: WELLBUTRIN XL Take  1 tablet  Daily for Mood, Focus & Concentration What changed:  how much to take how to take this when to take this additional instructions   finasteride 5 MG tablet Commonly known as: PROSCAR Take  1 tablet  Daily  for Prostate What changed:  how much to take how to take this when to take this additional instructions   gabapentin 300 MG capsule Commonly known as:  NEURONTIN TAKE ONE CAPSULE BY MOUTH THREE TIMES A DAY AS NEEDED FOR PAIN What changed:  how much to take how to take this when to take this additional instructions   glucose blood test strip Commonly known as: FREESTYLE TEST STRIPS Check blood sugar 3 to 4 times daily for medication regulation.   insulin NPH-regular Human (70-30) 100 UNIT/ML injection Commonly known as: NovoLIN 70/30 Take 20-30 units 2 x day or as directed What changed:  how much to take how to take this when to take this additional instructions   latanoprost 0.005 % ophthalmic solution Commonly known as: XALATAN Place 1 drop into both eyes at bedtime.   MAGnesium-Oxide 400 (241.3 Mg) MG tablet Generic drug: magnesium oxide Take 400 mg by mouth 2 (two) times daily.   mycophenolate 360 MG Tbec EC tablet Commonly known as: MYFORTIC TAKE 1 TABLET BY MOUTH TWICE DAILY   Oyster Shell Calcium/D 250-125 MG-UNIT Tabs Take 1 tablet by mouth daily.   PROBIOTIC DAILY PO Take 1 capsule by mouth daily.   tacrolimus 1 MG capsule Commonly known as: PROGRAF  Take 1 mg by mouth 2 (two) times daily.   tacrolimus 0.5 MG capsule Commonly known as: PROGRAF Take 0.5 mg by mouth 2 (two) times daily.   traZODone 50 MG tablet Commonly known as: DESYREL Take  1 tablet  1 hour  before Bedtime  as needed for Sleep What changed:  how much to take how to take this when to take this additional instructions   TURMERIC COMPLEX/BLACK PEPPER PO Take 1,500 mg by mouth daily.   vitamin C 1000 MG tablet Take 1,000 mg by mouth daily.   VITAMIN D PO Take 5,000-10,000 Units by mouth See admin instructions. Takes 10,000 unit capsules on MWF and 5,000 unit capsule 4 days a week.   warfarin 5 MG tablet Commonly known as: COUMADIN TAKE ONE TO TWO TABLETS BY MOUTH EVERY DAY OR AS DIRECTED What changed:  how much to take how to take this when to take this additional instructions   Zinc 50 MG Caps Take 50 mg by mouth  daily.        Follow-up Information     Donnie Mesa, MD Follow up.   Specialty: General Surgery Why: As needed Contact information: [address] [place] [id number] [phone]         [name], MD Follow up in 1 week(s).   Specialty: Internal Medicine Contact information: [address] [place] [address] [phone]         Nahser, Wonda Cheng, MD .   Specialty: Cardiology Contact information: [address] [place] [address] [phone]                Allergies  Allergen Reactions   Losartan Anaphylaxis   Crestor [Rosuvastatin] Other (See Comments)    Elevated LFT's   Lorazepam     Pt is unsure of reaction    Morphine And Related Other (See Comments)    Has no effect on pt     Consultations: General Surgery   Procedures/Studies: CT ABDOMEN PELVIS W CONTRAST  Result Date: 12/02/2020 CLINICAL DATA:  Abdominal pain.  Concern for diverticulitis. EXAM: CT ABDOMEN AND PELVIS WITH CONTRAST TECHNIQUE: Multidetector CT imaging of the abdomen and pelvis was performed using the standard protocol following bolus administration of intravenous contrast. CONTRAST:  16mL OMNIPAQUE IOHEXOL 350 MG/ML SOLN COMPARISON:  CT abdomen pelvis dated 12/11/2010. FINDINGS: Lower chest: The visualized lung bases are clear. There is mild cardiomegaly. Coronary vascular calcifications. No intra-abdominal free air or free fluid. Hepatobiliary: Fatty liver. No intrahepatic biliary ductal dilatation. Layering small stones within the gallbladder. No pericholecystic fluid. Pancreas: Unremarkable. No pancreatic ductal dilatation or surrounding inflammatory changes. Spleen: Small scattered calcified splenic granuloma. Adrenals/Urinary Tract: The adrenal glands unremarkable. The kidneys are atrophic. Multiple bilateral renal cysts measuring up to 4 cm in the interpolar right kidney. There is a right lower quadrant renal transplant.  There is no hydronephrosis of the transplant kidney. No peritransplant fluid collection. The visualized transplant ureter and urinary bladder appear unremarkable. Stomach/Bowel: There is sigmoid diverticulosis without active inflammatory changes. There is no bowel obstruction. The appendix is enlarged and inflamed. There is a stone at the base of the appendix. Several additional smaller stones noted in the lumen of the appendix. The appendix measures approximately 17 mm in diameter. There is periappendiceal stranding. The appendix is located in the right lower quadrant and extends posterior, medial, and inferior to the cecum. No evidence of perforation. No abscess. Vascular/Lymphatic: Moderate aortoiliac atherosclerotic disease. The IVC  is unremarkable. No portal venous gas. There is no adenopathy. Reproductive: The prostate and seminal vesicles are grossly unremarkable. No pelvic mass. Other: None Musculoskeletal: Degenerative changes of the spine. No acute osseous pathology. IMPRESSION: 1. Acute appendicitis. No abscess or perforation. 2. Sigmoid diverticulosis. 3. Atrophic kidneys with right lower quadrant renal transplant. No hydronephrosis. 4. Cholelithiasis. 5. Aortic Atherosclerosis (ICD10-I70.0). Electronically Signed   By: Anner Crete M.D.   On: 12/02/2020 00:57      Subjective: Patient was seen and examined at bedside.  He was sitting in the chair, denies any abdominal pain.   He reports feeling much improved,  has been tolerating soft diet, feels better and wants to be discharged.  Discharge Exam: Vitals:   12/05/20 0005 12/05/20 0611  BP: 136/64 (!) 126/59  Pulse: 70 63  Resp: 19 17  Temp: 98 F (36.7 C) 98.8 F (37.1 C)  SpO2: 97% 99%   Vitals:   12/04/20 1246 12/04/20 1759 12/05/20 0005 12/05/20 0611  BP: 114/89 (!) 143/80 136/64 (!) 126/59  Pulse: 70 62 70 63  Resp: 16 16 19 17   Temp: 98.4 F (36.9 C) 98.6 F (37 C) 98 F (36.7 C) 98.8 F (37.1 C)  TempSrc: Oral Oral  Oral Oral  SpO2: 96% 96% 97% 99%  Weight:      Height:        General: Pt is alert, awake, not in acute distress Cardiovascular: RRR, S1/S2 +, no rubs, no gallops Respiratory: CTA bilaterally, no wheezing, no rhonchi Abdominal: Soft, NT, ND, bowel sounds +, no RLQ tenderness Extremities: no edema, no cyanosis    The results of significant diagnostics from this hospitalization (including imaging, microbiology, ancillary and laboratory) are listed below for reference.     Microbiology: Recent Results (from the past 240 hour(s))  Resp Panel by RT-PCR (Flu A&B, Covid) Nasopharyngeal Swab     Status: None   Collection Time: 12/02/20  1:20 AM   Specimen: Nasopharyngeal Swab; Nasopharyngeal(NP) swabs in vial transport medium  Result Value Ref Range Status   SARS Coronavirus 2 by RT PCR NEGATIVE NEGATIVE Final    Comment: (NOTE) SARS-CoV-2 target nucleic acids are NOT DETECTED.  The SARS-CoV-2 RNA is generally detectable in upper respiratory specimens during the acute phase of infection. The lowest concentration of SARS-CoV-2 viral copies this assay can detect is 138 copies/mL. A negative result does not preclude SARS-Cov-2 infection and should not be used as the sole basis for treatment or other patient management decisions. A negative result may occur with  improper specimen collection/handling, submission of specimen other than nasopharyngeal swab, presence of viral mutation(s) within the areas targeted by this assay, and inadequate number of viral copies(<138 copies/mL). A negative result must be combined with clinical observations, patient history, and epidemiological information. The expected result is Negative.  Fact Sheet for Patients:  EntrepreneurPulse.com.au  Fact Sheet for Healthcare Providers:  IncredibleEmployment.be  This test is no t yet approved or cleared by the [place] FDA and  has been authorized for detection and/or  diagnosis of SARS-CoV-2 by FDA under an Emergency Use Authorization (EUA). This EUA will remain  in effect (meaning this test can be used) for the duration of the COVID-19 declaration under Section 564(b)(1) of the Act, 21 U.S.C.section 360bbb-3(b)(1), unless the authorization is terminated  or revoked sooner.       Influenza A by PCR NEGATIVE NEGATIVE Final   Influenza B by PCR NEGATIVE NEGATIVE Final    Comment: (NOTE) The Xpert Xpress  SARS-CoV-2/FLU/RSV plus assay is intended as an aid in the diagnosis of influenza from Nasopharyngeal swab specimens and should not be used as a sole basis for treatment. Nasal washings and aspirates are unacceptable for Xpert Xpress SARS-CoV-2/FLU/RSV testing.  Fact Sheet for Patients: EntrepreneurPulse.com.au  Fact Sheet for Healthcare Providers: IncredibleEmployment.be  This test is not yet approved or cleared by the [place] FDA and has been authorized for detection and/or diagnosis of SARS-CoV-2 by FDA under an Emergency Use Authorization (EUA). This EUA will remain in effect (meaning this test can be used) for the duration of the COVID-19 declaration under Section 564(b)(1) of the Act, 21 U.S.C. section 360bbb-3(b)(1), unless the authorization is terminated or revoked.  Performed at [facility], [address] [place], [place] [address]      Labs: BNP (last 3 results) No results for input(s): BNP in the last 8760 hours. Basic Metabolic Panel: Recent Labs  Lab 12/01/20 1921 12/03/20 0705 12/04/20 0517 12/05/20 0701  NA 136 135 137 139  K 4.2 4.0 3.7 3.8  CL 100 103 102 105  CO2 24 25 25 26   GLUCOSE 120* 105* 102* 113*  BUN 22 15 14 13   CREATININE 0.97 1.01 1.00 0.95  CALCIUM 9.5 8.9 9.4 9.4  MG  --  1.2* 1.6* 2.0   Liver Function Tests: Recent Labs  Lab 12/01/20 1921 12/02/20 1056 12/03/20 0705 12/04/20 0517  AST 20 20 19 19   ALT 12 14 15 14   ALKPHOS  86 76 73 65  BILITOT 3.3* 3.2* 2.8* 2.0*  PROT 6.7 6.0* 5.5* 5.5*  ALBUMIN 4.1 3.1* 2.9* 2.7*   Recent Labs  Lab 12/01/20 1921  LIPASE <10*   No results for input(s): AMMONIA in the last 168 hours. CBC: Recent Labs  Lab 11/30/20 1152 12/01/20 1921 12/03/20 0705 12/04/20 0517 12/05/20 0701  WBC 5.8 11.2* 7.4 5.5 4.0  NEUTROABS 3,880  --   --   --   --   HGB 15.5 14.7 13.2 12.5* 13.3  HCT 46.5 43.8 39.7 37.5* 39.5  MCV 93.6 94.2 95.4 94.7 94.3  PLT 140 136* 95* 101* 118*   Cardiac Enzymes: No results for input(s): CKTOTAL, CKMB, CKMBINDEX, TROPONINI in the last 168 hours. BNP: Invalid input(s): POCBNP CBG: Recent Labs  Lab 12/04/20 0415 12/04/20 1120 12/04/20 1945 12/05/20 0626 12/05/20 0823  GLUCAP 101* 149* 186* 90 162*   D-Dimer No results for input(s): DDIMER in the last 72 hours. Hgb A1c No results for input(s): HGBA1C in the last 72 hours. Lipid Profile No results for input(s): CHOL, HDL, LDLCALC, TRIG, CHOLHDL, LDLDIRECT in the last 72 hours. Thyroid function studies No results for input(s): TSH, T4TOTAL, T3FREE, THYROIDAB in the last 72 hours.  Invalid input(s): FREET3 Anemia work up No results for input(s): VITAMINB12, FOLATE, FERRITIN, TIBC, IRON, RETICCTPCT in the last 72 hours. Urinalysis    Component Value Date/Time   COLORURINE YELLOW 12/02/2020 0100   APPEARANCEUR CLEAR 12/02/2020 0100   LABSPEC 1.029 12/02/2020 0100   PHURINE 5.5 12/02/2020 0100   GLUCOSEU NEGATIVE 12/02/2020 0100   HGBUR NEGATIVE 12/02/2020 0100   BILIRUBINUR NEGATIVE 12/02/2020 0100   KETONESUR 40 (A) 12/02/2020 0100   PROTEINUR TRACE (A) 12/02/2020 0100   UROBILINOGEN 0.2 09/06/2013 1706   NITRITE NEGATIVE 12/02/2020 0100   LEUKOCYTESUR NEGATIVE 12/02/2020 0100   Sepsis Labs Invalid input(s): PROCALCITONIN,  WBC,  LACTICIDVEN Microbiology Recent Results (from the past 240 hour(s))  Resp Panel by RT-PCR (Flu A&B, Covid) Nasopharyngeal Swab  Status: None    Collection Time: 12/02/20  1:20 AM   Specimen: Nasopharyngeal Swab; Nasopharyngeal(NP) swabs in vial transport medium  Result Value Ref Range Status   SARS Coronavirus 2 by RT PCR NEGATIVE NEGATIVE Final    Comment: (NOTE) SARS-CoV-2 target nucleic acids are NOT DETECTED.  The SARS-CoV-2 RNA is generally detectable in upper respiratory specimens during the acute phase of infection. The lowest concentration of SARS-CoV-2 viral copies this assay can detect is 138 copies/mL. A negative result does not preclude SARS-Cov-2 infection and should not be used as the sole basis for treatment or other patient management decisions. A negative result may occur with  improper specimen collection/handling, submission of specimen other than nasopharyngeal swab, presence of viral mutation(s) within the areas targeted by this assay, and inadequate number of viral copies(<138 copies/mL). A negative result must be combined with clinical observations, patient history, and epidemiological information. The expected result is Negative.  Fact Sheet for Patients:  EntrepreneurPulse.com.au  Fact Sheet for Healthcare Providers:  IncredibleEmployment.be  This test is no t yet approved or cleared by the [place] FDA and  has been authorized for detection and/or diagnosis of SARS-CoV-2 by FDA under an Emergency Use Authorization (EUA). This EUA will remain  in effect (meaning this test can be used) for the duration of the COVID-19 declaration under Section 564(b)(1) of the Act, 21 U.S.C.section 360bbb-3(b)(1), unless the authorization is terminated  or revoked sooner.       Influenza A by PCR NEGATIVE NEGATIVE Final   Influenza B by PCR NEGATIVE NEGATIVE Final    Comment: (NOTE) The Xpert Xpress SARS-CoV-2/FLU/RSV plus assay is intended as an aid in the diagnosis of influenza from Nasopharyngeal swab specimens and should not be used as a sole basis for treatment.  Nasal washings and aspirates are unacceptable for Xpert Xpress SARS-CoV-2/FLU/RSV testing.  Fact Sheet for Patients: EntrepreneurPulse.com.au  Fact Sheet for Healthcare Providers: IncredibleEmployment.be  This test is not yet approved or cleared by the [place] FDA and has been authorized for detection and/or diagnosis of SARS-CoV-2 by FDA under an Emergency Use Authorization (EUA). This EUA will remain in effect (meaning this test can be used) for the duration of the COVID-19 declaration under Section 564(b)(1) of the Act, 21 U.S.C. section 360bbb-3(b)(1), unless the authorization is terminated or revoked.  Performed at [facility], [address] [place], [place] [address]      Time coordinating discharge: Over 30 minutes  SIGNED:   Shawna Clamp, MD  [facility] 12/05/2020, 11:39 AM Pager   If 7PM-7AM, please contact night-coverage [email or web address]

## 2020-12-05 NOTE — Progress Notes (Signed)
   Subjective/Chief Complaint: Completely asymptomatic Tolerating diet BM yesterday No abdominal pain   Objective: Vital signs in last 24 hours: Temp:  [98 F (36.7 C)-98.8 F (37.1 C)] 98.8 F (37.1 C) (08/30 0611) Pulse Rate:  [62-70] 63 (08/30 0611) Resp:  [16-19] 17 (08/30 0611) BP: (114-143)/(59-89) 126/59 (08/30 0611) SpO2:  [96 %-99 %] 99 % (08/30 0611) Last BM Date: 12/04/20  Intake/Output from previous day: 08/29 0701 - 08/30 0700 In: 50 [IV Piggyback:50] Out: -  Intake/Output this shift: Total I/O In: 120 [P.O.:120] Out: -  Gen:  Alert, NAD, pleasant Pulm:  normal rate and effort  Abd: Soft, ND, no tenderness, +BS Psych: A&Ox3  Skin: no rashes noted, warm and dry  Lab Results:  Recent Labs    12/04/20 0517 12/05/20 0701  WBC 5.5 4.0  HGB 12.5* 13.3  HCT 37.5* 39.5  PLT 101* 118*   BMET Recent Labs    12/03/20 0705 12/04/20 0517  NA 135 137  K 4.0 3.7  CL 103 102  CO2 25 25  GLUCOSE 105* 102*  BUN 15 14  CREATININE 1.01 1.00  CALCIUM 8.9 9.4   PT/INR Recent Labs    12/04/20 0517 12/05/20 0701  LABPROT 15.4* 15.1  INR 1.2 1.2   ABG No results for input(s): PHART, HCO3 in the last 72 hours.  Invalid input(s): PCO2, PO2  Studies/Results: No results found.  Anti-infectives: Anti-infectives (From admission, onward)    Start     Dose/Rate Route Frequency Ordered Stop   12/04/20 1000  amoxicillin-clavulanate (AUGMENTIN) 875-125 MG per tablet 1 tablet        1 tablet Oral Every 12 hours 12/04/20 0819     12/02/20 1015  piperacillin-tazobactam (ZOSYN) IVPB 3.375 g  Status:  Discontinued        3.375 g 12.5 mL/hr over 240 Minutes Intravenous Every 8 hours 12/02/20 0921 12/04/20 0819   12/02/20 0115  piperacillin-tazobactam (ZOSYN) IVPB 3.375 g        3.375 g 100 mL/hr over 30 Minutes Intravenous  Once 12/02/20 0112 12/02/20 0143       Assessment/Plan: Acute Appendicitis  - Continues to improve on conservative management.  Afebrile. WBC wnl (5.5), symptomatically improved with no RLQ pain,  OK for discharge today   FEN - FLD > Reg if tolerates, IVF per [facility] VTE - SCDs, ID - Zosyn > Augmentin   Hyperbilirubinemia - Patient with noted T. Bili of 3.3 on admission. LFT's are otherwise wnl. Downtrending on yesterdays labs. He had small stones on CT scan without signs acute cholecystitis. No RUQ tenderness on todays exam. Unclear significance.    Per [facility] Hx ESRD (PD dialysis 2011-2013, renal transplant 2013 currently on immunosuppressive medications) - kidney function wnl CAD s/p CABG - cards has seen and reports acceptable risk for surgery if needed with no further workup recommended PAF on Coumadin at home - INR 1.2 Dm2 HTN OSA 1st degree heart block   I recommended that he have a conversation with his former transplant surgeon re: possible interval appendectomy since it is sitting behind his transplant.  F/U with me as needed.  LOS: 3 days    Robert Arnold 12/05/2020

## 2020-12-05 NOTE — Care Management Important Message (Signed)
Important Message  Patient Details  Name: Robert Arnold MRN: [id number] Date of Birth: 1945-12-20   Medicare Important Message Given:  Yes  Patient left prior to IM delivery.  IM mailed to the patient home address.    Devlin Mcveigh 12/05/2020, 3:47 PM

## 2020-12-05 NOTE — Progress Notes (Signed)
RN gave patient discharge instructions and the patient stated understanding. IV has been removed and belongings have been packed. New medication escribed to home pharmacy.

## 2020-12-05 NOTE — Discharge Instructions (Signed)
Advised to follow-up with renal transplant team to discuss about interval appendicectomy as this has resolved with IV antibiotics. Advised to take Augmentin twice a day for 7 days.

## 2020-12-06 ENCOUNTER — Encounter: Payer: Self-pay | Admitting: Gastroenterology

## 2020-12-12 ENCOUNTER — Other Ambulatory Visit: Payer: Self-pay | Admitting: Internal Medicine

## 2020-12-12 MED ORDER — AMOXICILLIN-POT CLAVULANATE 875-125 MG PO TABS: ORAL_TABLET | ORAL | 0 refills | Status: DC

## 2020-12-13 ENCOUNTER — Telehealth: Payer: Self-pay

## 2020-12-13 NOTE — Telephone Encounter (Signed)
Called patient on 12/13/2020 , 3:55 PM in an attempt to reach the patient for a hospital follow up.   Admit date: 12/01/20 Discharge: 12/05/20   He does not have any questions or concerns about medications from the hospital admission. The patient's medications were reviewed over the phone, they were counseled to bring in all current medications to the hospital follow up visit.   I advised the patient to call if any questions or concerns arise about the hospital admission or medications    Home health was not started in the hospital.  All questions were answered and a follow up appointment was made.   Prior to Admission medications   Medication Sig Start Date End Date Taking? Authorizing Provider  allopurinol (ZYLOPRIM) 300 MG tablet Take 1/2 tablet Daily  to Prevent  Gout Patient taking differently: Take 150 mg by mouth daily. 11/30/20   Liane Comber, NP  amoxicillin-clavulanate (AUGMENTIN) 875-125 MG tablet Take  1 tablet  2 x /day (every 12 hours)  for Infection 12/12/20   [name], MD  Ascorbic Acid (VITAMIN C) 1000 MG tablet Take 1,000 mg by mouth daily.    [provider]  aspirin EC 81 MG tablet Take 81 mg by mouth daily.    [provider]  atorvastatin (LIPITOR) 40 MG tablet Take     1 tablet     Daily      for Cholesterol Patient taking differently: Take 40 mg by mouth daily. 09/13/20   [name], MD  B Complex-Biotin-FA (B COMPLETE) TABS Take 1 tablet by mouth daily.    [provider]  Black Pepper-Turmeric (TURMERIC COMPLEX/BLACK PEPPER PO) Take 1,500 mg by mouth daily.    [provider]  buPROPion (WELLBUTRIN XL) 300 MG 24 hr tablet Take  1 tablet  Daily for Mood, Focus & Concentration Patient taking differently: Take 300 mg by mouth daily. 09/20/20   [name], MD  Calcium Carbonate-Vitamin D (OYSTER SHELL CALCIUM/D) 250-125 MG-UNIT TABS Take 1 tablet by mouth daily.    [provider]  finasteride (PROSCAR) 5 MG  tablet Take  1 tablet  Daily  for Prostate Patient taking differently: Take 5 mg by mouth daily. 06/28/20   [name], MD  gabapentin (NEURONTIN) 300 MG capsule TAKE ONE CAPSULE BY MOUTH THREE TIMES A DAY AS NEEDED FOR PAIN Patient taking differently: Take 300 mg by mouth at bedtime. 06/19/20   Liane Comber, NP  glucose blood (FREESTYLE TEST STRIPS) test strip Check blood sugar 3 to 4 times daily for medication regulation. 12/04/15   [name], MD  insulin NPH-regular Human (NOVOLIN 70/30) (70-30) 100 UNIT/ML injection Take 20-30 units 2 x day or as directed Patient taking differently: Inject 12-22 Units into the skin in the morning and at bedtime. 22 units in the AM, 12-15 units in the PM. 02/12/14   [name], MD  latanoprost (XALATAN) 0.005 % ophthalmic solution Place 1 drop into both eyes at bedtime.  04/11/16   [provider]  MAGNESIUM-OXIDE 400 (241.3 Mg) MG tablet Take 400 mg by mouth 2 (two) times daily.  12/29/15   [provider]  mycophenolate (MYFORTIC) 360 MG TBEC EC tablet TAKE 1 TABLET BY MOUTH TWICE DAILY Patient taking differently: Take 360 mg by mouth 2 (two) times daily. 05/06/16   [name], MD  Probiotic Product (PROBIOTIC DAILY PO) Take 1 capsule by mouth daily.    [provider]  tacrolimus (PROGRAF) 0.5 MG capsule Take 0.5 mg by  mouth 2 (two) times daily.    [provider]  tacrolimus (PROGRAF) 1 MG capsule Take 1 mg by mouth 2 (two) times daily.    [provider]  traZODone (DESYREL) 50 MG tablet Take  1 tablet  1 hour  before Bedtime  as needed for Sleep Patient taking differently: Take 50 mg by mouth at bedtime. 07/19/20   [name], MD  VITAMIN D PO Take 5,000-10,000 Units by mouth See admin instructions. Takes 10,000 unit capsules on MWF and 5,000 unit capsule 4 days a week.    [provider]  warfarin (COUMADIN) 5 MG tablet TAKE ONE TO TWO TABLETS BY MOUTH EVERY DAY OR AS  DIRECTED Patient taking differently: Take 5 mg by mouth daily. 07/27/20   Liane Comber, NP  Zinc 50 MG CAPS Take 50 mg by mouth daily.    [provider]

## 2020-12-14 ENCOUNTER — Inpatient Hospital Stay: Payer: Medicare Other | Admitting: Nurse Practitioner

## 2020-12-14 ENCOUNTER — Encounter: Payer: Self-pay | Admitting: Internal Medicine

## 2020-12-14 ENCOUNTER — Other Ambulatory Visit: Payer: Self-pay

## 2020-12-14 ENCOUNTER — Ambulatory Visit (INDEPENDENT_AMBULATORY_CARE_PROVIDER_SITE_OTHER): Payer: Medicare Other | Admitting: Internal Medicine

## 2020-12-14 VITALS — BP 112/70 | HR 61 | Temp 97.9°F | Resp 12 | Ht 68.0 in | Wt 185.2 lb

## 2020-12-14 DIAGNOSIS — M542 Cervicalgia: Secondary | ICD-10-CM

## 2020-12-14 DIAGNOSIS — K3580 Unspecified acute appendicitis: Secondary | ICD-10-CM

## 2020-12-14 DIAGNOSIS — E1169 Type 2 diabetes mellitus with other specified complication: Secondary | ICD-10-CM | POA: Diagnosis not present

## 2020-12-14 DIAGNOSIS — I10 Essential (primary) hypertension: Secondary | ICD-10-CM | POA: Diagnosis not present

## 2020-12-14 DIAGNOSIS — Z7901 Long term (current) use of anticoagulants: Secondary | ICD-10-CM

## 2020-12-14 DIAGNOSIS — E785 Hyperlipidemia, unspecified: Secondary | ICD-10-CM

## 2020-12-14 DIAGNOSIS — E1122 Type 2 diabetes mellitus with diabetic chronic kidney disease: Secondary | ICD-10-CM | POA: Diagnosis not present

## 2020-12-14 DIAGNOSIS — F325 Major depressive disorder, single episode, in full remission: Secondary | ICD-10-CM

## 2020-12-14 DIAGNOSIS — Z79899 Other long term (current) drug therapy: Secondary | ICD-10-CM

## 2020-12-14 DIAGNOSIS — I480 Paroxysmal atrial fibrillation: Secondary | ICD-10-CM

## 2020-12-14 DIAGNOSIS — Z794 Long term (current) use of insulin: Secondary | ICD-10-CM

## 2020-12-14 DIAGNOSIS — N182 Chronic kidney disease, stage 2 (mild): Secondary | ICD-10-CM

## 2020-12-14 DIAGNOSIS — Z940 Kidney transplant status: Secondary | ICD-10-CM

## 2020-12-14 MED ORDER — GABAPENTIN 300 MG PO CAPS
ORAL_CAPSULE | ORAL | 1 refills | Status: DC
Start: 2020-12-14 — End: 2021-04-10

## 2020-12-14 NOTE — Progress Notes (Signed)
Future Appointments  Date Time Provider [place]  01/26/2021  8:30 AM Armbruster, Carlota Raspberry, MD [facility]  05/16/2021        -  2:00 PM Liane Comber, NP [facility] None    Robert Arnold     This very nice 75 y.o. MWM was admitted to the hospital on 12/01/2020 and patient was discharged from the hospital 8 days ago on  12/05/2020. The patient now presents for follow up for transition from recent hospitalization or SNIF stay.  The day after discharge  our clinical staff contacted the patient to assure stability and schedule a follow up appointment. The discharge summary, medications and diagnostic test results were reviewed before meeting with the patient. The patient was admitted for:   Acute appendicitis  Hyperlipidemia associated with type 2 diabetes mellitus  PAF (paroxysmal atrial fibrillation) ([place]) Essential hypertension Renal Transplant, s/p   09/2011 Depression, major, in remission       Patient is a very nice 75 yo MWM wiyth hx/o HTN, HLD,. ASHD s/p CABG, s/p RLQ Kidney Transplant in 2013 who was admitted with an acute appendicitis and because of his hx of his RLQ Kidney transplant, the plan was to treat him conservatively with IV Zosyn. There was no evidence of abscess or perforation and he gradually improved with conservative care. Patient was followed closely by surgeon Dr Gershon Crane and as patient's condition improved and he was able to tolerate diet,  he was felt stable for transition to oral Augmentin 2 x /day . and close f/u as an out-patient. His Coumadin was restarted at discharge 8 days ago (INR 1.2 x at discharge) . Dr Gershon Crane also advised patient to seek urgent consultation with his Transplantation surgeon, Dr Marland Kitchen Hippen  / [facility]  [phone]  and  Fax  [phone]) in [id number] of elective surgery. Post discharge patient's Rx Augmentin was renewed pending  consultation with Dr Ailene Rud.      Hospitalization discharge  instructions and medications are reconciled with the patient.      Patient is also followed with Hypertension, Hyperlipidemia, Pre-Diabetes and Vitamin D Deficiency.      Patient is treated for HTN & BP has been controlled at home. Today's BP is at goal-  112/70. Patient has had no complaints of any cardiac type chest pain, palpitations, dyspnea/orthopnea/PND, dizziness, claudication, or dependent edema.     Hyperlipidemia is controlled with diet & meds. Patient denies myalgias or other med SE's. Last Lipids were at goal:  Lab Results  Component Value Date   CHOL 147 05/16/2020   HDL 64 05/16/2020   LDLCALC 66 05/16/2020   TRIG 87 05/16/2020   CHOLHDL 2.3 05/16/2020      Also, the patient has history of  Insulin Requiring T2_IDDM and has had no symptoms of reactive hypoglycemia, diabetic polys, paresthesias or visual blurring.  Last A1c was at goal:  Lab Results  Component Value Date   HGBA1C 5.9 (H) 10/04/2020      Further, the patient also has history of Vitamin D Deficiency and supplements vitamin D without any suspected side-effects. Last vitamin D was  at goal:  Lab Results  Component Value Date   VD25OH 27 07/19/2019   Current Outpatient Medications on File Prior to Visit  Medication Sig   allopurinol 300 MG tablet Take 1/2 tablet  (150 mg) Daily  to Prevent  Gout   AUGMENTIN 875-125 MG tablet Take  1 tablet  2 x /day (every 12 hours)  for  Infection   Ascorbic Acid (VITAMIN C) 1000 MG tablet Take daily.   aspirin EC 81 MG tablet Take daily.   atorvastatin (LIPITOR) 40 MG tablet Take 1 tablet  Daily   B Complex-Biotin-FA (B COMPLETE) TABS Take 1 tablet  daily.   Black Pepper-Turmeric 1,500 mg  Take daily.   buPROPion XL 300 MG  Take  1 tablet  Daily \   CALCIUM/D -  250-125  Take 1 tablet  daily.   finasteride  5 MG tablet Take  1 tablet  Daily    gabapentin 300 MG capsule TAKE ONE CAPSULE at bedtime   NOVOLIN 70/30  inject Take  22 units in the AM, 12-15 units in the PM    XALATAN 0.005 % ophth soln Place 1 drop into both eyes at bedtime.    MAGNESIUM  400 mg  Take   times daily.    mycophenolate (MYFORTIC) 360 MG  TAKE 1 TABLET TWICE DAILY    Probiotic  Take 1 capsule daily.   tacrolimus (PROGRAF) 0.5 MG capsule Take  2 times daily.   tacrolimus (PROGRAF) 1 MG capsule Take 1 mg  2  times daily.   traZODone  50 MG tablet Take  1 tablet  1 hour  before Bedtime     VITAMIN D PO Takes 10,000 u (MWF ) &  5,000 u  4 days /week.   warfarin (COUMADIN) 5 MG tablet Take 5 mg daily.)   Zinc 50 MG CAPS Take 50 mg daily.    Allergies  Allergen Reactions   Losartan Anaphylaxis   Crestor [Rosuvastatin] Elevated LFT's   Lorazepam unsure of reaction   Morphine And Related Has no effect on pt    PMHx:   Past Medical History:  Diagnosis Date   Adenomatous colon polyp    Allergy    BPH (benign prostatic hyperplasia)    CHF (congestive heart failure) (HCC)    Chronic kidney disease    due IgA nephropathy - s/p kidnet transplant 09/25/11   Diabetes mellitus type 2, controlled ([place])    FHx: heart disease 02/27/2018   Glaucoma    Gout    Histoplasmosis    on itraconazole for prophylaxis   MI (myocardial infarction) ([place]) 09/26/2011   NSTEMI (non-ST elevated myocardial infarction) ([place]) 11/26/2013   2013    OSA (obstructive sleep apnea)    PAF (paroxysmal atrial fibrillation) ([place])    s/p DC-CV in 6/13. Off coumadin due to ureteral bleed   Testosterone deficiency 02/27/2018   Immunization History  Administered Date(s) Administered   DT (Pediatric) 06/05/2015   Hepatitis B 04/08/2009   Influenza, High Dose Seasonal PF 12/27/2013, 01/17/2015, 01/11/2016, 01/02/2018, 12/21/2018, 01/31/2020   Influenza-Unspecified 12/25/2016   PFIZER(Purple Top)SARS-COV-2 Vaccination 07/24/2019, 08/17/2019   Pneumococcal Conjugate-13 10/25/2013   Pneumococcal Polysaccharide-23 06/05/2015, 11/30/2020   Pneumococcal-Unspecified 04/08/2008   Tdap 04/08/2008   Zoster Recombinat  (Shingrix) 02/12/2018, 06/02/2018   Past Surgical History:  Procedure Laterality Date   ANTERIOR CERVICAL DECOMP/DISCECTOMY FUSION N/A 05/04/2018   Procedure: Cervical Five-Six Anterior cervical decompression/discectomy/fusion;  Surgeon: Jovita Gamma, MD;  Location: [facility];  Service: Neurosurgery;  Laterality: N/A;  Cervical Five-Six Anterior cervical decompression/discectomy/fusion   AV FISTULA PLACEMENT  2011   Left forearm   COLONOSCOPY     CORONARY ANGIOPLASTY WITH STENT PLACEMENT     CORONARY ARTERY BYPASS GRAFT  2013   KIDNEY TRANSPLANT  09/25/2011   PERITONEAL CATHETER INSERTION  2011   FHx:    Reviewed / unchanged  SHx:    Reviewed / unchanged  Systems Review:  Constitutional: Denies fever, chills, wt changes, headaches, insomnia, fatigue, night sweats, change in appetite. Eyes: Denies redness, blurred vision, diplopia, discharge, itchy, watery eyes.  ENT: Denies discharge, congestion, post nasal drip, epistaxis, sore throat, earache, hearing loss, dental pain, tinnitus, vertigo, sinus pain, snoring.  CV: Denies chest pain, palpitations, irregular heartbeat, syncope, dyspnea, diaphoresis, orthopnea, PND, claudication or edema. Respiratory: denies cough, dyspnea, DOE, pleurisy, hoarseness, laryngitis, wheezing.  Gastrointestinal: Denies dysphagia, odynophagia, heartburn, reflux, water brash, abdominal pain or cramps, nausea, vomiting, bloating, diarrhea, constipation, hematemesis, melena, hematochezia  or hemorrhoids. Genitourinary: Denies dysuria, frequency, urgency, nocturia, hesitancy, discharge, hematuria or flank pain. Musculoskeletal: Denies arthralgias, myalgias, stiffness, jt. swelling, pain, limping or strain/sprain.  Skin: Denies pruritus, rash, hives, warts, acne, eczema or change in skin lesion(s). Neuro: No weakness, tremor, incoordination, spasms, paresthesia or pain. Psychiatric: Denies confusion, memory loss or sensory loss. Endo: Denies change in weight, skin  or hair change.  Heme/Lymph: No excessive bleeding, bruising or enlarged lymph nodes.  Physical Exam  BP 112/70   Pulse 61   Temp 97.9 F (36.6 C)   Resp 16   Wt 185 lb 3.2 oz (84 kg)   SpO2 97%   BMI 28.16 kg/m   Appears well nourished, well groomed  and in no distress.  Eyes: PERRLA, EOMs, conjunctiva no swelling or erythema. Sinuses: No frontal/maxillary tenderness ENT/Mouth: EAC's clear, TM's nl w/o erythema, bulging. Nares clear w/o erythema, swelling, exudates. Oropharynx clear without erythema or exudates. Oral hygiene is good. Tongue normal, non obstructing. Hearing intact.  Neck: Supple. Thyroid nl. Car 2+/2+ without bruits, nodes or JVD. Chest: Respirations nl with BS clear & equal w/o rales, rhonchi, wheezing or stridor.  Cor: Heart sounds normal w/ regular rate and rhythm without sig. murmurs, gallops, clicks or rubs. Peripheral pulses normal and equal  without edema.  Abdomen: Soft & bowel sounds normal. Non-tender w/o guarding, rebound, hernias, masses or organomegaly.  Lymphatics: Unremarkable.  Musculoskeletal: Full ROM all peripheral extremities, joint stability, 5/5 strength and normal gait.  Skin: Warm, dry without exposed rashes, lesions or ecchymosis apparent.  Neuro: Cranial nerves intact, reflexes equal bilaterally. Sensory-motor testing grossly intact. Tendon reflexes grossly intact.  Pysch: Alert & oriented x 3.  Insight and judgement nl & appropriate. No ideations.  Assessment and Plan:    1. Acute appendicitis, unspecified acute appendicitis type   2. Essential hypertension  - Continue medication, monitor blood pressure at home.  - Continue DASH diet.  Reminder to go to the ER if any CP,  SOB, nausea, dizziness, severe HA, changes vision/speech.    - CBC with Differential/Platelet - COMPLETE METABOLIC PANEL WITH GFR  3. Hyperlipidemia associated with type 2 diabetes mellitus ([place])  - Continue diet/meds, exercise,& lifestyle modifications.  -  Continue monitor periodic cholesterol/liver & renal functions    4. Type 2 diabetes mellitus with stage 2 chronic kidney disease, with long-term current use of insulin (HCC)  - Continue diet, exercise, lifestyle modifications.     5. PAF (paroxysmal atrial fibrillation) ([facility])  - COMPLETE METABOLIC PANEL WITH GFR  6. Renal Transplant, s/p 09/2011  - COMPLETE METABOLIC PANEL WITH GFR  7. Depression, major, in remission ([place])   8. Anticoagulant long-term use  - CBC with Differential/Platelet - Protime-INR  9. Medication management  - CBC with Differential/Platelet - COMPLETE METABOLIC PANEL WITH GFR        Discussed  regular exercise, BP monitoring, weight control to achieve/maintain  BMI less than 25 and discussed meds and SE's. Recommended labs to assess and monitor clinical status with further disposition pending results of labs. Over 30 minutes of exam, counseling, chart review was performed.   Kirtland Bouchard, MD    CC: Hermenia Bers, MD (Attending) [phone] (Work) [phone] (Fax) [address] [place], [place] [address]

## 2020-12-14 NOTE — Progress Notes (Signed)
Future Appointments  Date Time Provider [facility]  01/26/2021  8:30 AM Armbruster, Carlota Raspberry, MD [facility]  05/16/2021        -  2:00 PM Liane Comber, NP [facility] None    [place]     This very nice 75 y.o. MWM was admitted to the hospital on 12/01/2020 and patient was discharged from the hospital 8 days ago on  12/05/2020. The patient now presents for follow up for transition from recent hospitalization or SNIF stay.  The day after discharge  our clinical staff contacted the patient to assure stability and schedule a follow up appointment. The discharge summary, medications and diagnostic test results were reviewed before meeting with the patient. The patient was admitted for:   Acute appendicitis  Hyperlipidemia associated with type 2 diabetes mellitus  PAF (paroxysmal atrial fibrillation) ([place]) Essential hypertension Renal Transplant, s/p   09/2011 Depression, major, in remission       Patient is a very nice 75 yo MWM wiyth hx/o HTN, HLD,. ASHD s/p CABG, s/p RLQ Kidney Transplant in 2013 who was admitted with an acute appendicitis and because of his hx of his RLQ Kidney transplant, the plan was to treat him conservatively with IV Zosyn. There was no evidence of abscess or perforation and he gradually improved with conservative care. Patient was followed closely by surgeon Dr Gershon Crane and as patient's condition improved and he was able to tolerate diet,  he was felt stable for transition to oral Augmentin 2 x /day . and close f/u as an out-patient. His Coumadin was restarted at discharge 8 days ago (INR 1.2 x at discharge) . Dr Gershon Crane also advised patient to seek urgent consultation with his Transplantation surgeon, Dr Marland Kitchen Hippen  / Baldo Ash  [phone]  and  Fax  [phone]) in [place] [id number] of elective surgery. Post discharge patient's Rx Augmentin was renewed pending  consultation with Dr Ailene Rud.      Hospitalization discharge  instructions and medications are reconciled with the patient.      Patient is also followed with Hypertension, Hyperlipidemia, Pre-Diabetes and Vitamin D Deficiency.      Patient is treated for HTN & BP has been controlled at home. Today's BP is at goal-  112/70. Patient has had no complaints of any cardiac type chest pain, palpitations, dyspnea/orthopnea/PND, dizziness, claudication, or dependent edema.     Hyperlipidemia is controlled with diet & meds. Patient denies myalgias or other med SE's. Last Lipids were at goal:  Lab Results  Component Value Date   CHOL 147 05/16/2020   HDL 64 05/16/2020   LDLCALC 66 05/16/2020   TRIG 87 05/16/2020   CHOLHDL 2.3 05/16/2020      Also, the patient has history of  Insulin Requiring T2_IDDM and has had no symptoms of reactive hypoglycemia, diabetic polys, paresthesias or visual blurring.  Last A1c was at goal:  Lab Results  Component Value Date   HGBA1C 5.9 (H) 10/04/2020      Further, the patient also has history of Vitamin D Deficiency and supplements vitamin D without any suspected side-effects. Last vitamin D was  at goal:  Lab Results  Component Value Date   VD25OH 57 07/19/2019   Current Outpatient Medications on File Prior to Visit  Medication Sig   allopurinol 300 MG tablet Take 1/2 tablet  (150 mg) Daily  to Prevent  Gout   AUGMENTIN 875-125 MG tablet Take  1 tablet  2 x /day (every 12 hours)  for  Infection   Ascorbic Acid (VITAMIN C) 1000 MG tablet Take daily.   aspirin EC 81 MG tablet Take daily.   atorvastatin (LIPITOR) 40 MG tablet Take 1 tablet  Daily   B Complex-Biotin-FA (B COMPLETE) TABS Take 1 tablet  daily.   Black Pepper-Turmeric 1,500 mg  Take daily.   buPROPion XL 300 MG  Take  1 tablet  Daily \   CALCIUM/D -  250-125  Take 1 tablet  daily.   finasteride  5 MG tablet Take  1 tablet  Daily    gabapentin 300 MG capsule TAKE ONE CAPSULE at bedtime   NOVOLIN 70/30  inject Take  22 units in the AM, 12-15 units in the PM    XALATAN 0.005 % ophth soln Place 1 drop into both eyes at bedtime.    MAGNESIUM  400 mg  Take   times daily.    mycophenolate (MYFORTIC) 360 MG  TAKE 1 TABLET TWICE DAILY    Probiotic  Take 1 capsule daily.   tacrolimus (PROGRAF) 0.5 MG capsule Take  2 times daily.   tacrolimus (PROGRAF) 1 MG capsule Take 1 mg  2  times daily.   traZODone  50 MG tablet Take  1 tablet  1 hour  before Bedtime     VITAMIN D PO Takes 10,000 u (MWF ) &  5,000 u  4 days /week.   warfarin (COUMADIN) 5 MG tablet Take 5 mg daily.)   Zinc 50 MG CAPS Take 50 mg daily.    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 Off coumadin due to ureteral bleed   Testosterone deficiency 02/27/2018   Immunization History  Administered Date(s) Administered   DT (Pediatric) 06/05/2015   Hepatitis B 04/08/2009   Influenza, High Dose Seasonal PF 12/27/2013, 01/17/2015, 01/11/2016, 01/02/2018, 12/21/2018, 01/31/2020   Influenza-Unspecified 12/25/2016   PFIZER(Purple Top)SARS-COV-2 Vaccination 07/24/2019, 08/17/2019   Pneumococcal Conjugate-13 10/25/2013   Pneumococcal Polysaccharide-23 06/05/2015, 11/30/2020   Pneumococcal-Unspecified 04/08/2008   Tdap 04/08/2008   Zoster Recombinat  (Shingrix) 02/12/2018, 06/02/2018   Past Surgical History:  Procedure Laterality Date   ANTERIOR CERVICAL DECOMP/DISCECTOMY FUSION N/A 05/04/2018   Procedure: Cervical Five-Six Anterior cervical decompression/discectomy/fusion;  Surgeon: Jovita Gamma, MD;  Location: [facility];  Service: Neurosurgery;  Laterality: N/A;  Cervical Five-Six Anterior cervical decompression/discectomy/fusion   AV FISTULA PLACEMENT  2011   Left forearm   COLONOSCOPY     CORONARY ANGIOPLASTY WITH STENT PLACEMENT     CORONARY ARTERY BYPASS GRAFT  2013   KIDNEY TRANSPLANT  09/25/2011   PERITONEAL CATHETER INSERTION  2011   FHx:    Reviewed / unchanged  SHx:    Reviewed / unchanged  Systems Review:  Constitutional: Denies fever, chills, wt changes, headaches, insomnia, fatigue, night sweats, change in appetite. Eyes: Denies redness, blurred vision, diplopia, discharge, itchy, watery eyes.  ENT: Denies discharge, congestion, post nasal drip, epistaxis, sore throat, earache, hearing loss, dental pain, tinnitus, vertigo, sinus pain, snoring.  CV: Denies chest pain, palpitations, irregular heartbeat, syncope, dyspnea, diaphoresis, orthopnea, PND, claudication or edema. Respiratory: denies cough, dyspnea, DOE, pleurisy, hoarseness, laryngitis, wheezing.  Gastrointestinal: Denies dysphagia, odynophagia, heartburn, reflux, water brash, abdominal pain or cramps, nausea, vomiting, bloating, diarrhea, constipation, hematemesis, melena, hematochezia  or hemorrhoids. Genitourinary: Denies dysuria, frequency, urgency, nocturia, hesitancy, discharge, hematuria or flank pain. Musculoskeletal: Denies arthralgias, myalgias, stiffness, jt. swelling, pain, limping or strain/sprain.  Skin: Denies pruritus, rash, hives, warts, acne, eczema or change in skin lesion(s). Neuro: No weakness, tremor, incoordination, spasms, paresthesia or pain. Psychiatric: Denies confusion, memory loss or sensory loss. Endo: Denies change in weight, skin  or hair change.  Heme/Lymph: No excessive bleeding, bruising or enlarged lymph nodes.  Physical Exam  BP 112/70   Pulse 61   Temp 97.9 F (36.6 C)   Resp 16   Wt 185 lb 3.2 oz (84 kg)   SpO2 97%   BMI 28.16 kg/m   Appears well nourished, well groomed  and in no distress.  Eyes: PERRLA, EOMs, conjunctiva no swelling or erythema. Sinuses: No frontal/maxillary tenderness ENT/Mouth: EAC's clear, TM's nl w/o erythema, bulging. Nares clear w/o erythema, swelling, exudates. Oropharynx clear without erythema or exudates. Oral hygiene is good. Tongue normal, non obstructing. Hearing intact.  Neck: Supple. Thyroid nl. Car 2+/2+ without bruits, nodes or JVD. Chest: Respirations nl with BS clear & equal w/o rales, rhonchi, wheezing or stridor.  Cor: Heart sounds normal w/ regular rate and rhythm without sig. murmurs, gallops, clicks or rubs. Peripheral pulses normal and equal  without edema.  Abdomen: Soft & bowel sounds normal. Non-tender w/o guarding, rebound, hernias, masses or organomegaly.  Lymphatics: Unremarkable.  Musculoskeletal: Full ROM all peripheral extremities, joint stability, 5/5 strength and normal gait.  Skin: Warm, dry without exposed rashes, lesions or ecchymosis apparent.  Neuro: Cranial nerves intact, reflexes equal bilaterally. Sensory-motor testing grossly intact. Tendon reflexes grossly intact.  Pysch: Alert & oriented x 3.  Insight and judgement nl & appropriate. No ideations.  Assessment and Plan:    1. Acute appendicitis, unspecified acute appendicitis type   2. Essential hypertension  - Continue medication, monitor blood pressure at home.  - Continue DASH diet.  Reminder to go to the ER if any CP,  SOB, nausea, dizziness, severe HA, changes vision/speech.    - CBC with Differential/Platelet - COMPLETE METABOLIC PANEL WITH GFR  3. Hyperlipidemia associated with type 2 diabetes mellitus ([place])  - Continue diet/meds, exercise,& lifestyle modifications.  -  Continue monitor periodic cholesterol/liver & renal functions    4. Type 2 diabetes mellitus with stage 2 chronic kidney disease, with long-term current use of insulin (HCC)  - Continue diet, exercise, lifestyle modifications.     5. PAF (paroxysmal atrial fibrillation) (HCC)  - COMPLETE METABOLIC PANEL WITH GFR  6. Renal Transplant, s/p 09/2011  - COMPLETE METABOLIC PANEL WITH GFR  7. Depression, major, in remission ([place])   8. Anticoagulant long-term use  - CBC with Differential/Platelet - Protime-INR  9. Medication management  - CBC with Differential/Platelet - COMPLETE METABOLIC PANEL WITH GFR        Discussed  regular exercise, BP monitoring, weight control to achieve/maintain  BMI less than 25 and discussed meds and SE's. Recommended labs to assess and monitor clinical status with further disposition pending results of labs. Over 30 minutes of exam, counseling, chart review was performed.   Kirtland Bouchard, MD    CC: Hermenia Bers, MD (Attending) [phone] (Work) [phone] (Fax) [address] [place], [place] [address]

## 2020-12-14 NOTE — Patient Instructions (Addendum)
Appendicitis, Adult Appendicitis is inflammation of the appendix. The appendix is a finger-shaped tube that is attached to the large intestine. If appendicitis is not treated, it can cause the appendix to tear (rupture). A ruptured appendix can lead to a life-threatening infection. It can also cause a painful collection of pus (abscess) to form in the appendix. What are the causes? This condition may be caused by a blockage in the appendix that leads to infection. The blockage can be caused by: A ball of stool (feces). Enlarged lymph glands. In some cases, the cause may not be known. What increases the risk? Age is a risk factor. You are more likely to develop this condition if you are between 28 and 16 years of age. What are the signs or symptoms? Symptoms of this condition include: Pain that starts around the belly button and moves toward the lower right part of the abdomen. The pain can become more severe as time passes. It gets worse with coughing or sudden movements. Tenderness in the lower right abdomen. Nausea. Vomiting. Loss of appetite. Fever. Difficulty passing stool (constipation). Passing very loose stools (diarrhea). Generally feeling unwell. How is this diagnosed? This condition may be diagnosed with: A physical exam. Blood tests. Urine test. To confirm the diagnosis, an ultrasound, MRI, or CT scan may be done. How is this treated? Sometimes, this condition can be treated with antibiotic medicines alone. However, it is usually treated with both antibiotic medicines and surgery to remove the appendix (appendectomy). If only antibiotic medicine is given and the appendix is not removed, there is a chance that appendicitis could come back. There are two methods for doing an appendectomy: Open appendectomy. In this surgery, the appendix is removed through a large incision that is made in the lower right abdomen. This procedure may be recommended if: You have major scarring from  a previous surgery. You have a bleeding disorder. You are pregnant and are about to give birth. You have a condition that makes it hard to do surgery through small incisions (laparoscopic procedure). This includes severe infection or a ruptured appendix. Laparoscopic appendectomy. In this surgery, the appendix is removed through small incisions. This procedure usually causes less pain and fewer problems than an open appendectomy. It also has a shorter recovery time. If the appendix has ruptured and an abscess has formed: A drain may be placed into the abscess to remove fluid. Antibiotic medicines may be given through an IV. The appendix may or may not need to be removed. Follow these instructions at home: If you had surgery, follow instructions from your health care provider about how to care for yourself at home and how to care for your incision. Medicines Take over-the-counter and prescription medicines only as told by your health care provider. If you were prescribed an antibiotic medicine, take it as told by your health care provider. Do not stop taking the antibiotic even if you start to feel better. Eating and drinking Follow instructions from your health care provider about eating restrictions. You may slowly resume a regular diet once your nausea or vomiting stops. General instructions Do not use any products that contain nicotine or tobacco, such as cigarettes, e-cigarettes, and chewing tobacco. If you need help quitting, ask your health care provider. Do not drive or use heavy machinery while taking prescription pain medicine. Ask your health care provider if the medicine prescribed to you can cause constipation. You may need to take steps to prevent or treat constipation, such as: Drink enough  fluid to keep your urine pale yellow. Take over-the-counter or prescription medicines. Eat foods that are high in fiber, such as beans, whole grains, and fresh fruits and vegetables. Limit  foods that are high in fat and processed sugars, such as fried or sweet foods. Keep all follow-up visits as told by your health care provider. This is important. Contact a health care provider if: There is pus, blood, or excessive drainage coming from your incision. You have nausea or vomiting. Get help right away if you have: Worsening abdominal pain. A fever. Chills. Fatigue. Muscle aches. Shortness of breath. Summary Appendicitis is inflammation of the appendix. This condition may be caused by a blockage in the appendix that leads to infection. This condition is usually treated with both antibiotic medicines and surgery to remove the appendix (appendectomy).Laparoscopic Appendectomy, Adult, Care After This sheet gives you information about how to care for yourself after your procedure. Your health care provider may also give you more specific instructions. If you have problems or questions, contact your health care provider. What can I expect after the procedure? After the procedure, it is common to have: Little energy for normal activities. Mild pain in the area where the incisions were made. Difficulty passing stool (constipation). This can be caused by: Pain medicine. A decrease in your activity. Follow these instructions at home: Medicines Take over-the-counter and prescription medicines only as told by your health care provider. If you were prescribed an antibiotic medicine, take it as told by your health care provider. Do not stop taking the antibiotic even if you start to feel better. Do not drive or use heavy machinery while taking prescription pain medicine. Ask your health care provider if the medicine prescribed to you can cause constipation. You may need to take steps to prevent or treat constipation, such as: Drink enough fluid to keep your urine pale yellow. Take over-the-counter or prescription medicines. Eat foods that are high in fiber, such as beans, whole grains,  and fresh fruits and vegetables. Limit foods that are high in fat and processed sugars, such as fried or sweet foods. Incision care  Follow instructions from your health care provider about how to take care of your incisions. Make sure you: Wash your hands with soap and water before and after you change your bandage (dressing). If soap and water are not available, use hand sanitizer. Change your dressing as told by your health care provider. Leave stitches (sutures), skin glue, or adhesive strips in place. These skin closures may need to stay in place for 2 weeks or longer. If adhesive strip edges start to loosen and curl up, you may trim the loose edges. Do not remove adhesive strips completely unless your health care provider tells you to do that. Check your incision areas every day for signs of infection. Check for: Redness, swelling, or pain. Fluid or blood. Warmth. Pus or a bad smell. Bathing Keep your incisions clean and dry. Clean them as often as told by your health care provider. To do this: Gently wash the incisions with soap and water. Rinse the incisions with water to remove all soap. Pat the incisions dry with a clean towel. Do not rub the incisions. Do not take baths, swim, or use a hot tub for 2 weeks, or until your health care provider approves. You may take showers after 48 hours. Activity  Do not drive for 24 hours if you were given a sedative during your procedure. Rest after the procedure. Return to your normal  activities as told by your health care provider. Ask your health care provider what activities are safe for you. For 3 weeks, or for as long as told by your health care provider: Do not lift anything that is heavier than 10 lb (4.5 kg), or the limit that you are told. Do not play contact sports. General instructions If you were sent home with a drain, follow instructions from your health care provider about how to care for it. Take deep breaths. This helps to  prevent your lungs from developing an infection (pneumonia). Keep all follow-up visits as told by your health care provider. This is important. Contact a health care provider if: You have redness, swelling, or pain around an incision. You have fluid or blood coming from an incision. Your incision feels warm to the touch. You have pus or a bad smell coming from an incision or dressing. Your incision edges break open after your sutures have been removed. You have increasing pain in your shoulders. You feel dizzy or you faint. You develop shortness of breath. You keep feeling nauseous or you are vomiting. You have diarrhea or you cannot control your bowel functions. You lose your appetite. You develop swelling or pain in your legs. You develop a rash. Get help right away if you have: A fever. Difficulty breathing. Sharp pains in your chest. Summary After a laparoscopic appendectomy, it is common to have little energy for normal activities, mild pain in the area of the incisions, and constipation. Infection is the most common complication after this procedure. Follow your health care provider's instructions about caring for yourself after the procedure. Rest after the procedure. Return to your normal activities as told by your health care provider. Contact your health care provider if you notice signs of infection around your incisions or you develop shortness of breath. Get help right away if you have a fever, chest pain, or difficulty breathing. This information is not intended to replace advice given to you by your health care provider. Make sure you discuss any questions you have with your health care provider. Document Revised: 09/25/2017 Document Reviewed: 09/25/2017 Elsevier Patient Education  2022 [employer].

## 2020-12-15 LAB — COMPLETE METABOLIC PANEL WITH GFR
AG Ratio: 1.7 (calc) (ref 1.0–2.5)
ALT: 13 U/L (ref 9–46)
AST: 18 U/L (ref 10–35)
Albumin: 4 g/dL (ref 3.6–5.1)
Alkaline phosphatase (APISO): 97 U/L (ref 35–144)
BUN: 23 mg/dL (ref 7–25)
CO2: 29 mmol/L (ref 20–32)
Calcium: 9.6 mg/dL (ref 8.6–10.3)
Chloride: 104 mmol/L (ref 98–110)
Creat: 1.07 mg/dL (ref 0.70–1.28)
Globulin: 2.3 g/dL (calc) (ref 1.9–3.7)
Glucose, Bld: 87 mg/dL (ref 65–99)
Potassium: 4.4 mmol/L (ref 3.5–5.3)
Sodium: 140 mmol/L (ref 135–146)
Total Bilirubin: 1 mg/dL (ref 0.2–1.2)
Total Protein: 6.3 g/dL (ref 6.1–8.1)
eGFR: 74 mL/min/{1.73_m2} (ref >=60)

## 2020-12-15 LAB — CBC WITH DIFFERENTIAL/PLATELET
Absolute Monocytes: 423 cells/uL (ref 200–950)
Basophils Absolute: 78 cells/uL (ref 0–200)
Basophils Relative: 1.7 %
Eosinophils Absolute: 110 cells/uL (ref 15–500)
Eosinophils Relative: 2.4 %
HCT: 44.9 % (ref 38.5–50.0)
Hemoglobin: 14.6 g/dL (ref 13.2–17.1)
Lymphs Abs: 1334 cells/uL (ref 850–3900)
MCH: 31.2 pg (ref 27.0–33.0)
MCHC: 32.5 g/dL (ref 32.0–36.0)
MCV: 95.9 fL (ref 80.0–100.0)
MPV: 9.2 fL (ref 7.5–12.5)
Monocytes Relative: 9.2 %
Neutro Abs: 2654 cells/uL (ref 1500–7800)
Neutrophils Relative %: 57.7 %
Platelets: 183 10*3/uL (ref 140–400)
RBC: 4.68 10*6/uL (ref 4.20–5.80)
RDW: 12.5 % (ref 11.0–15.0)
Total Lymphocyte: 29 %
WBC: 4.6 10*3/uL (ref 3.8–10.8)

## 2020-12-15 LAB — PROTIME-INR
INR: 1.7 — ABNORMAL HIGH
Prothrombin Time: 17.1 s — ABNORMAL HIGH (ref 9.0–11.5)

## 2020-12-15 NOTE — Progress Notes (Signed)
============================================================ -   Test results slightly outside the reference range are not unusual. If there is anything important, I will review this with you,  otherwise it is considered normal test values.  If you have further questions,  please do not hesitate to contact me at the office or via My Chart.  ============================================================ ============================================================  -  ProTime / INR = 1.7 x - is slightly low (  Ideal or goal is between 2.0-2.5 x  )   - But since taking Antibiotic now (Augmentin) which can raise the INR, suggest   Keep dose of 5 mg /day same for now  ! ============================================================ ============================================================  -  All Else - CBC - Kidneys - Electrolytes - Liver - Magnesium & Thyroid    - all  Normal / OK ============================================================ ============================================================

## 2020-12-27 ENCOUNTER — Other Ambulatory Visit: Payer: Self-pay | Admitting: Internal Medicine

## 2020-12-27 DIAGNOSIS — E1169 Type 2 diabetes mellitus with other specified complication: Secondary | ICD-10-CM

## 2020-12-28 ENCOUNTER — Other Ambulatory Visit: Payer: Self-pay

## 2020-12-28 DIAGNOSIS — N138 Other obstructive and reflux uropathy: Secondary | ICD-10-CM

## 2020-12-28 MED ORDER — FINASTERIDE 5 MG PO TABS
ORAL_TABLET | ORAL | 1 refills | Status: DC
Start: 2020-12-28 — End: 2021-05-01

## 2021-01-01 ENCOUNTER — Ambulatory Visit: Payer: Medicare Other | Admitting: Adult Health

## 2021-01-01 ENCOUNTER — Ambulatory Visit: Payer: Medicare Other

## 2021-01-12 NOTE — Progress Notes (Signed)
1 month follow up for Coumadin Assessment:   [name] was seen today for follow-up.  Diagnoses and all orders for this visit:  PAF (paroxysmal atrial fibrillation) ([place]) - Check INR q month and will adjust medication according to labs.  Discussed if patient falls to immediately contact office or go to ER. Discussed foods that can increase or decrease Coumadin levels. Patient understands to call the office before starting a new medication. Follow up in one month.  -     Protime-INR  Anticoagulant long-term use -     Protime-INR  Thrombocytopenia ([place])   Check CBC at routine visits  Essential hypertension  - continue medications, DASH diet, exercise and monitor at home. Call if greater than 130/80.    Flu vaccine need -     Flu vaccine HIGH DOSE PF  Cerumen debris on tympanic membrane of right ear   Ear irrigated to remove wax in right ear   Over 15 minutes of exam, counseling, chart review, and critical decision making was performed  Future Appointments  Date Time Provider [place]  01/26/2021  8:30 AM Armbruster, Carlota Raspberry, MD [facility]  02/15/2021  9:30 AM [name], MD [facility] None  03/20/2021  8:45 AM Liane Comber, NP [facility] None  05/16/2021  2:00 PM Liane Comber, NP [facility] None    Subjective:  Robert Arnold is a 75 y.o. male who presents for 1 month coumadin follow up for a. fib, also hypertension, flu vaccine and ear pain  Pt has had pain in right ear for 1 week. Hearing diminished in that ear.  Denies fever, sinus congestion, myalgias.    BMI is Body mass index is 28.74 kg/m., he has been working on diet, has been working on cycling and walking, working up to 30 min days a week, plans to start weights and resistance bands.  He drinks 1-2 cups of coffee. Minimal alochol. Drinks lots of water. Stress levels are low.  Wt Readings from Last 3 Encounters:  01/15/21 189 lb (85.7 kg)  12/14/20 185 lb 3.2 oz (84 kg)  12/02/20  188 lb 15 oz (85.7 kg)   He has p. A. Fib, on coumadin; hx of CAD s/p stenting 09/2011 and CABG 02/2012, has done well since. 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  He has hx of systolic CHF, improved/resolved on most recent ECHO 04/2018 however with diastolic dysfunction and LVEF 50-55%.  Follows with Dr. Acie Fredrickson.  He has history of Afib and is on coumadin,  His coumadin was not changed last visit - currently taking 5 mg daily   no nose bleeds, blood stool/urine. No missed doses, no ABX.  Lab Results  Component Value Date   INR 1.7 (H) 12/14/2020   INR 1.2 12/05/2020   INR 1.2 12/04/2020   His blood pressure has been controlled at home (recently running 110/60s-07s), today their BP is BP: 132/72 He does workout, works around the house. He denies chest pain, shortness of breath, dizziness.    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  On prograf and myfortic, follows [facility]  Now back in CKD 2 stage;  Lab Results  Component Value Date   GFRNONAA >60 12/05/2020   He reports has been on new B complex (unsure dose) for 4-5 months.  Lab Results  Component Value Date   VITAMINB12 730 10/30/2020      Medication Review: Current Outpatient Medications on File  Prior to Visit  Medication Sig Dispense Refill   allopurinol (ZYLOPRIM) 300 MG tablet Take 1/2 tablet Daily  to Prevent  Gout (Patient taking differently: Take 150 mg by mouth daily.) 90 tablet 3   Ascorbic Acid (VITAMIN C) 1000 MG tablet Take 1,000 mg by mouth daily.     aspirin EC 81 MG tablet Take 81 mg by mouth daily.     atorvastatin (LIPITOR) 40 MG tablet TAKE ONE TABLET BY MOUTH DAILY FOR CHOLESTEROL 90 tablet 0   B Complex-Biotin-FA (B COMPLETE) TABS Take 1 tablet by mouth daily.     Black Pepper-Turmeric (TURMERIC COMPLEX/BLACK PEPPER PO) Take 1,500 mg by mouth daily.     buPROPion (WELLBUTRIN XL) 300 MG 24 hr tablet Take  1 tablet  Daily  for Mood, Focus & Concentration (Patient taking differently: Take 300 mg by mouth daily.) 90 tablet 3   Calcium Carbonate-Vitamin D (OYSTER SHELL CALCIUM/D) 250-125 MG-UNIT TABS Take 1 tablet by mouth daily.     finasteride (PROSCAR) 5 MG tablet Take  1 tablet  Daily  for Prostate 90 tablet 1   gabapentin (NEURONTIN) 300 MG capsule TAKE ONE CAPSULE BY MOUTH THREE TIMES A DAY AS NEEDED FOR PAIN 270 capsule 1   glucose blood (FREESTYLE TEST STRIPS) test strip Check blood sugar 3 to 4 times daily for medication regulation. 450 each PRN   insulin NPH-regular Human (NOVOLIN 70/30) (70-30) 100 UNIT/ML injection Take 20-30 units 2 x day or as directed (Patient taking differently: Inject 12-22 Units into the skin in the morning and at bedtime. 22 units in the AM, 12-15 units in the PM.) 10 mL 99   latanoprost (XALATAN) 0.005 % ophthalmic solution Place 1 drop into both eyes at bedtime.   4   MAGNESIUM-OXIDE 400 (241.3 Mg) MG tablet Take 400 mg by mouth 2 (two) times daily.   6   mycophenolate (MYFORTIC) 360 MG TBEC EC tablet TAKE 1 TABLET BY MOUTH TWICE DAILY (Patient taking differently: Take 360 mg by mouth 2 (two) times daily.) 180 tablet 1   Probiotic Product (PROBIOTIC DAILY PO) Take 1 capsule by mouth daily.     tacrolimus (PROGRAF) 0.5 MG capsule Take 0.5 mg by mouth 2 (two) times daily.     tacrolimus (PROGRAF) 1 MG capsule Take 1 mg by mouth 2 (two) times daily.     traZODone (DESYREL) 50 MG tablet Take  1 tablet  1 hour  before Bedtime  as needed for Sleep (Patient taking differently: Take 50 mg by mouth at bedtime.) 90 tablet 3   VITAMIN D PO Take 5,000-10,000 Units by mouth See admin instructions. Takes 10,000 unit capsules on MWF and 5,000 unit capsule 4 days a week.     warfarin (COUMADIN) 5 MG tablet TAKE ONE TO TWO TABLETS BY MOUTH EVERY DAY OR AS DIRECTED (Patient taking differently: Take 5 mg by mouth daily.) 180 tablet 2   Zinc 50 MG CAPS Take 50 mg by mouth daily.      amoxicillin-clavulanate (AUGMENTIN) 875-125 MG tablet Take  1 tablet  2 x /day (every 12 hours)  for Infection (Patient not taking: Reported on 01/15/2021) 60 tablet 0   No current facility-administered medications on file prior to visit.    Allergies: Allergies  Allergen Reactions   Losartan Anaphylaxis   Crestor [Rosuvastatin] Other (See Comments)    Elevated LFT's   Lorazepam     Pt is unsure of reaction    Morphine And Related Other (See Comments)  Has no effect on pt     Current Problems (verified) has Chronic systolic heart failure ([place]); CKD stage 2 due to type 2 diabetes mellitus ([place]); Hyperlipidemia associated with type 2 diabetes mellitus ([place]); BPH with obstruction/lower urinary tract symptoms; Gout; PAF (paroxysmal atrial fibrillation) ([place]); Essential hypertension; Vitamin D deficiency; Anticoagulant long-term use; Renal Transplant, s/p 09/2011; Depression, major, in remission ([place]); Thrombocytopenia ([place]); CAD (coronary artery disease) of artery bypass graft; IgA nephropathy; Essential tremor; Overweight (BMI 25.0-29.9); Senile purpura ([place]); Type 2 diabetes mellitus with stage 2 chronic kidney disease, with long-term current use of insulin ([place]); Former smoker (quit 1992); HNP (herniated nucleus pulposus), cervical; S/P cervical spinal fusion; Chronic hip pain, bilateral; Hypercoagulopathy ([place]); Mobitz I; A-V fistula ([place]); First degree AV block; History of skin cancer; B12 deficiency; Other abnormal glucose (prediabetes); and Acute appendicitis on their problem list.   Surgical: He  has a past surgical history that includes Colonoscopy; Peritoneal catheter insertion (2011); AV fistula placement (2011); Kidney transplant (09/25/2011); Coronary angioplasty with stent; Coronary artery bypass graft (2013); and Anterior cervical decomp/discectomy fusion (N/A, 05/04/2018). Family His family history includes Alcohol abuse in his brother and maternal grandfather; Alcoholism in his  brother; Alzheimer's disease in his brother; COPD in his brother and mother; Diabetes in his mother; Heart attack in his father; Kidney disease in his sister. Social history  He reports that he quit smoking about 30 years ago. His smoking use included cigarettes. He started smoking about 54 years ago. He has a 48.00 pack-year smoking history. He has never used smokeless tobacco. He reports current alcohol use. He reports that he does not use drugs.    Review of Systems  Constitutional:  Negative for malaise/fatigue and weight loss.  HENT:  Positive for ear pain (right). Negative for hearing loss, sore throat and tinnitus.   Eyes:  Negative for blurred vision and double vision.  Respiratory:  Negative for cough, sputum production, shortness of breath and wheezing.   Cardiovascular:  Negative for chest pain, palpitations, orthopnea, claudication, leg swelling and PND.  Gastrointestinal:  Negative for abdominal pain, blood in stool, constipation, diarrhea, heartburn, melena, nausea and vomiting.  Genitourinary: Negative.   Musculoskeletal:  Negative for falls, joint pain and myalgias.  Skin:  Negative for rash.  Neurological:  Negative for dizziness, tingling, sensory change, weakness and headaches.  Endo/Heme/Allergies:  Negative for polydipsia. Bruises/bleeds easily.  Psychiatric/Behavioral: Negative.  Negative for depression, memory loss, substance abuse and suicidal ideas. The patient is not nervous/anxious and does not have insomnia.   All other systems reviewed and are negative.    Objective:   Today's Vitals   01/15/21 0840  BP: 132/72  Pulse: 68  Temp: 97.7 F (36.5 C)  SpO2: 97%  Weight: 189 lb (85.7 kg)   Body mass index is 28.74 kg/m.  General Appearance: Well nourished, in no apparent distress. Eyes: PERRLA, EOMs, conjunctiva no swelling or erythema Sinuses: No Frontal/maxillary tenderness ENT/Mouth: Ext aud canals clear, TMs without erythema, bulging. Hardened wax  noted right ear almost covering the canal. No erythema, swelling, or exudate on post pharynx. Tonsils not swollen or erythematous. Bil hearing aids.  After right ear irrigation, wax removed Neck: Supple, thyroid normal.  Respiratory: Respiratory effort normal, BS equal bilaterally without rales, rhonchi, wheezing or stridor.  Cardio: Irregularly irregular, 3/6 early systolic blowing murmur. Brisk LE pulses, without edema. Has AV fistula to left forearm with thrill. Sensation intact, cap refill brisk Abdomen: Soft, + BS.  Non tender, no guarding, rebound,  hernias, masses. Lymphatics: Non tender without lymphadenopathy.  Musculoskeletal: Full ROM, 5/5 strength, normal gait.  Skin: Warm, dry without rashes; ecchymoses to bilateral upper extremities;  Neuro: Cranial nerves intact. Normal muscle tone, no cerebellar symptoms.  Psych: Awake and oriented X 3, normal affect, Insight and Judgment appropriate.   Magda Bernheim ANP-C  [facility]  01/15/2021

## 2021-01-15 ENCOUNTER — Ambulatory Visit (INDEPENDENT_AMBULATORY_CARE_PROVIDER_SITE_OTHER): Payer: Medicare Other | Admitting: Nurse Practitioner

## 2021-01-15 ENCOUNTER — Encounter: Payer: Self-pay | Admitting: Nurse Practitioner

## 2021-01-15 ENCOUNTER — Other Ambulatory Visit: Payer: Self-pay

## 2021-01-15 VITALS — BP 132/72 | HR 68 | Temp 97.7°F | Wt 189.0 lb

## 2021-01-15 DIAGNOSIS — H6121 Impacted cerumen, right ear: Secondary | ICD-10-CM | POA: Diagnosis not present

## 2021-01-15 DIAGNOSIS — Z7901 Long term (current) use of anticoagulants: Secondary | ICD-10-CM | POA: Diagnosis not present

## 2021-01-15 DIAGNOSIS — I480 Paroxysmal atrial fibrillation: Secondary | ICD-10-CM

## 2021-01-15 DIAGNOSIS — Z23 Encounter for immunization: Secondary | ICD-10-CM

## 2021-01-15 DIAGNOSIS — I10 Essential (primary) hypertension: Secondary | ICD-10-CM | POA: Diagnosis not present

## 2021-01-15 DIAGNOSIS — D696 Thrombocytopenia, unspecified: Secondary | ICD-10-CM | POA: Diagnosis not present

## 2021-01-15 NOTE — Patient Instructions (Signed)
Ear Irrigation Ear irrigation is a procedure to wash dirt and wax out of your ear canal. This procedure is also called lavage. You may need ear irrigation if you are having trouble hearing because of a buildup of earwax. You may also have ear irrigation as part of the treatment for an ear infection. Getting wax and dirt out of your ear canal can help ear drops work better. Tell a health care provider about: Any allergies you have. All medicines you are taking, including vitamins, herbs, eye drops, creams, and over-the-counter medicines. Any problems you or family members have had with anesthetic medicines. Any blood disorders you have. Any surgeries you have had. This includes any ear surgeries. Any medical conditions you have. Whether you are pregnant or may be pregnant. What are the risks? Generally, this is a safe procedure. However, problems may occur, including: Infection. Pain. Hearing loss. Fluid and debris being pushed through the eardrum and into the middle ear. This can occur if there are holes in the eardrum. Ear irrigation failing to work. What happens before the procedure? You will talk with your provider about the procedure and plan. You may be given ear drops to put in your ear 15-20 minutes before irrigation. This helps loosen the wax. What happens during the procedure?  A syringe is filled with water or saline solution, which is made of salt and water. The syringe is gently inserted into the ear canal. The fluid is used to flush out wax and other debris. The procedure may vary among health care providers and hospitals. What can I expect after the procedure? After an ear irrigation, follow instructions given to you by your health care provider. Follow these instructions at home: Using ear irrigation kits Ear irrigation kits are available for use at home. Ask your health care provider if this is an option for you. In general, you should: Use a home irrigation kit only as  told by your health care provider. Read the package instructions carefully. Follow the directions for using the syringe. Use water that is room temperature. Do not do ear irrigation at home if you: Have diabetes. Diabetes increases the risk of infection. Have a hole or tear in your eardrum. Have tubes in your ears. Have had any ear surgery in the past. Have been told not to irrigate your ears. Cleaning your ears  Clean the outside of your ear with a soft washcloth daily. If told by your health care provider, use a few drops of baby oil, mineral oil, glycerin, hydrogen peroxide, or over-the-counter earwax softening drops. Do not use cotton swabs to clean your ears. These can push wax down into the ear canal. Do not put anything into your ears to try to remove wax. This includes ear candles. General instructions Take over-the-counter and prescription medicines only as told by your health care provider. If you were prescribed an antibiotic medicine, use it as told by your health care provider. Do not stop using the antibiotic even if your condition improves. Keep the ear clean and dry by following the instructions from your health care provider. Keep all follow-up visits. This is important. Visit your health care provider at least once a year to have your ears and hearing checked. Contact a health care provider if: Your hearing is not improving or is getting worse. You have pain or redness in your ear. You are dizzy. You have ringing in your ears. You have nausea or vomiting. You have fluid, blood, or pus coming out  your ear. Summary Ear irrigation is a procedure to wash dirt and wax out of your ear canal. This procedure is also called lavage. To perform ear irrigation, ear drops may be put in your ear 15-20 minutes before irrigation. Water or saline solution will be used to flush out earwax and other debris. You may be able to irrigate your ears at home. Ask your health care  provider if this is an option for you. Follow your health care provider's instructions. Clean your ears with a soft cloth after irrigation. Do not use cotton swabs to clean your ears. These can push wax down into the ear canal. This information is not intended to replace advice given to you by your health care provider. Make sure you discuss any questions you have with your healthcare provider. Document Revised: 07/13/2019 Document Reviewed: 07/13/2019 Elsevier Patient Education  2022 [employer]  

## 2021-01-16 LAB — PROTIME-INR
INR: 2.5 — ABNORMAL HIGH
Prothrombin Time: 24.4 s — ABNORMAL HIGH (ref 9.0–11.5)

## 2021-01-24 ENCOUNTER — Other Ambulatory Visit: Payer: Self-pay | Admitting: Internal Medicine

## 2021-01-24 DIAGNOSIS — I482 Chronic atrial fibrillation, unspecified: Secondary | ICD-10-CM

## 2021-01-24 MED ORDER — WARFARIN SODIUM 5 MG PO TABS: ORAL_TABLET | ORAL | 3 refills | Status: DC

## 2021-01-26 ENCOUNTER — Telehealth: Payer: Self-pay

## 2021-01-26 ENCOUNTER — Ambulatory Visit (INDEPENDENT_AMBULATORY_CARE_PROVIDER_SITE_OTHER): Payer: Medicare Other | Admitting: Gastroenterology

## 2021-01-26 ENCOUNTER — Encounter: Payer: Self-pay | Admitting: Gastroenterology

## 2021-01-26 VITALS — BP 112/60 | HR 68 | Ht 68.0 in | Wt 188.2 lb

## 2021-01-26 DIAGNOSIS — Z86010 Personal history of colonic polyps: Secondary | ICD-10-CM

## 2021-01-26 DIAGNOSIS — K760 Fatty (change of) liver, not elsewhere classified: Secondary | ICD-10-CM

## 2021-01-26 DIAGNOSIS — R933 Abnormal findings on diagnostic imaging of other parts of digestive tract: Secondary | ICD-10-CM

## 2021-01-26 DIAGNOSIS — Z8719 Personal history of other diseases of the digestive system: Secondary | ICD-10-CM | POA: Diagnosis not present

## 2021-01-26 DIAGNOSIS — I25708 Atherosclerosis of coronary artery bypass graft(s), unspecified, with other forms of angina pectoris: Secondary | ICD-10-CM

## 2021-01-26 DIAGNOSIS — Z7901 Long term (current) use of anticoagulants: Secondary | ICD-10-CM

## 2021-01-26 MED ORDER — SUTAB 1479-225-188 MG PO TABS: 1.0000 | ORAL_TABLET | Freq: Once | ORAL | 0 refills | Status: AC

## 2021-01-26 NOTE — Progress Notes (Signed)
HPI :  75 year old male history of CAD s/p NSTEMI and PAF, currently on coumadin, and s/p renal transplant in 2013, history of appendicitis, referred by Dr. Melford Aase for history of appendicitis, history of polyps, consideration for colonoscopy.  I have not seen the patient since December 2017, the timing of his last colonoscopy.  At that point time he had a 5 mm ascending adenoma and diverticulosis of the sigmoid colon.  Otherwise no concerning pathology.  He states at the end of August he developed severe lower right quadrant pain.  States he never felt anything like that before.  Presented to the hospital where he was found to have an appendicitis.  He was treated conservatively by the surgeons with IV antibiotics and appendectomy was not performed given this is overlying his transplanted kidney.  He was in the hospital for 4 days.  He states his pain resolved and he has been doing well since the admission.  He states he is now having 2 formed stools per day as opposed to normal baseline of 1, has been going on over the past year but does not really bother him at all.  No abdominal pains.  No blood in his stools.  He is seeing his nephrologist every 3 to 4 months down in [place].  He was seen by a general surgeon down there to consider appendectomy and they recommended a colonoscopy to ensure no appendiceal polyp that could present like this.  He denies any cardiopulmonary symptoms at baseline.  States his A. fib is well controlled.  He is hoping to have colonoscopy as soon as possible so he can proceed with surgery if needed.    Incidentally noted on his CT scan when he was admitted he had hepatic steatosis.He is not drinking any alcohol routinely. He has a history of fatty liver disease. Normal LFTs. Stable weight in the past year.   Prior workup: Colonoscopy 01/01/2011 - diverticulosis, 51mm transverse adenoma  Colonoscopy 03/13/2016 - The perianal and digital rectal examinations were normal. - A  5 mm polyp was found in the ascending colon. The polyp was sessile. The polyp was removed with a cold snare. Resection and retrieval were complete. - A single medium-mouthed diverticulum was found in the sigmoid colon. - Internal hemorrhoids were found during retroflexion. - The exam was otherwise without abnormality.  Surgical [P], ascending, polyp -TUBULAR ADENOMA. -NO HIGH GRADE DYSPLASIA OR MALIGNANCY IDENTIFIED  Echocardiogram 04/17/18 - EF 50-55%, grade II DD   CT abdomen pelvis 12/02/20: IMPRESSION: 1. Acute appendicitis. No abscess or perforation. 2. Sigmoid diverticulosis. 3. Atrophic kidneys with right lower quadrant renal transplant. No hydronephrosis. 4. Cholelithiasis. 5. Aortic Atherosclerosis (ICD10-I70.0).     Past Medical History:  Diagnosis Date   Adenomatous colon polyp    Allergy    BPH (benign prostatic hyperplasia)    CHF (congestive heart failure) ([place])    Chronic kidney disease    due IgA nephropathy - s/p kidnet transplant 09/25/11   Diabetes mellitus type 2, controlled ([place])    FHx: heart disease 02/27/2018   Glaucoma    Gout    Histoplasmosis    on itraconazole for prophylaxis   MI (myocardial infarction) ([place]) 09/26/2011   NSTEMI (non-ST elevated myocardial infarction) ([place]) 11/26/2013   2013    OSA (obstructive sleep apnea)    PAF (paroxysmal atrial fibrillation) ([place])    s/p DC-CV in 6/13. Off coumadin due to ureteral bleed   Testosterone deficiency 02/27/2018     Past Surgical History:  Procedure Laterality Date   ANTERIOR CERVICAL DECOMP/DISCECTOMY FUSION N/A 05/04/2018   Procedure: Cervical Five-Six Anterior cervical decompression/discectomy/fusion;  Surgeon: Jovita Gamma, MD;  Location: [facility];  Service: Neurosurgery;  Laterality: N/A;  Cervical Five-Six Anterior cervical decompression/discectomy/fusion   AV FISTULA PLACEMENT  2011   Left forearm   COLONOSCOPY     CORONARY ANGIOPLASTY WITH STENT PLACEMENT     CORONARY ARTERY  BYPASS GRAFT  2013   KIDNEY TRANSPLANT  09/25/2011   PERITONEAL CATHETER INSERTION  2011   Family History  Problem Relation Age of Onset   Diabetes Mother    COPD Mother    Heart attack Father        Unsure    Kidney disease Sister        no longer speak   Alcoholism Brother    Alzheimer's disease Brother    Alcohol abuse Brother    COPD Brother    Alcohol abuse Maternal Grandfather    Colon cancer Neg Hx    Stomach cancer Neg Hx    Rectal cancer Neg Hx    Esophageal cancer Neg Hx    Liver cancer Neg Hx    Pancreatic cancer Neg Hx    Social History   Tobacco Use   Smoking status: Former    Packs/day: 2.00    Years: 24.00    Pack years: 48.00    Types: Cigarettes    Start date: 1968    Quit date: 11/29/1990    Years since quitting: 30.1   Smokeless tobacco: Never  Vaping Use   Vaping Use: Never used  Substance Use Topics   Alcohol use: Yes    Comment: 1-2 a month   Drug use: No   Current Outpatient Medications  Medication Sig Dispense Refill   allopurinol (ZYLOPRIM) 300 MG tablet Take 1/2 tablet Daily  to Prevent  Gout (Patient taking differently: Take 300 mg by mouth daily.) 90 tablet 3   Ascorbic Acid (VITAMIN C) 1000 MG tablet Take 1,000 mg by mouth daily.     aspirin EC 81 MG tablet Take 81 mg by mouth daily.     atorvastatin (LIPITOR) 40 MG tablet TAKE ONE TABLET BY MOUTH DAILY FOR CHOLESTEROL 90 tablet 0   B Complex-Biotin-FA (B COMPLETE) TABS Take 1 tablet by mouth daily.     Black Pepper-Turmeric (TURMERIC COMPLEX/BLACK PEPPER PO) Take 1,500 mg by mouth daily.     buPROPion (WELLBUTRIN XL) 300 MG 24 hr tablet Take  1 tablet  Daily for Mood, Focus & Concentration (Patient taking differently: Take  1 tablet  Daily for Mood, Focus & Concentration) 90 tablet 3   Calcium Carbonate-Vitamin D (OYSTER SHELL CALCIUM/D) 250-125 MG-UNIT TABS Take 1 tablet by mouth daily.     finasteride (PROSCAR) 5 MG tablet Take  1 tablet  Daily  for Prostate 90 tablet 1   gabapentin  (NEURONTIN) 300 MG capsule TAKE ONE CAPSULE BY MOUTH THREE TIMES A DAY AS NEEDED FOR PAIN 270 capsule 1   glucose blood (FREESTYLE TEST STRIPS) test strip Check blood sugar 3 to 4 times daily for medication regulation. 450 each PRN   insulin NPH-regular Human (NOVOLIN 70/30) (70-30) 100 UNIT/ML injection Take 20-30 units 2 x day or as directed (Patient taking differently: Inject 12-22 Units into the skin in the morning and at bedtime. 22 units in the AM, 12-15 units in the PM.) 10 mL 99   latanoprost (XALATAN) 0.005 % ophthalmic solution Place 1 drop into both eyes at  bedtime.   4   MAGNESIUM-OXIDE 400 (241.3 Mg) MG tablet Take 400 mg by mouth 2 (two) times daily.   6   mycophenolate (MYFORTIC) 360 MG TBEC EC tablet TAKE 1 TABLET BY MOUTH TWICE DAILY 180 tablet 1   Probiotic Product (PROBIOTIC DAILY PO) Take 1 capsule by mouth daily.     tacrolimus (PROGRAF) 0.5 MG capsule Take 0.5 mg by mouth 2 (two) times daily.     tacrolimus (PROGRAF) 1 MG capsule Take 1 mg by mouth 2 (two) times daily.     traZODone (DESYREL) 50 MG tablet Take  1 tablet  1 hour  before Bedtime  as needed for Sleep (Patient taking differently: Take  1 tablet  1 hour  before Bedtime  as needed for Sleep) 90 tablet 3   VITAMIN D PO Take 5,000-10,000 Units by mouth See admin instructions. Takes 10,000 unit capsules on MWF and 5,000 unit capsule 4 days a week.     warfarin (COUMADIN) 5 MG tablet Take 1 to 2 tablets  /day as directed to Prevent Blood Clots                                   /         TAKE ONE TO TWO TABLETS BY MOUTH 180 tablet 3   Zinc 50 MG CAPS Take 50 mg by mouth daily.     No current facility-administered medications for this visit.   Allergies  Allergen Reactions   Losartan Anaphylaxis   Crestor [Rosuvastatin] Other (See Comments)    Elevated LFT's   Lorazepam     Pt is unsure of reaction    Morphine And Related Other (See Comments)    Has no effect on pt      Review of Systems: All systems reviewed  and negative except where noted in HPI.   Lab Results  Component Value Date   WBC 4.6 12/14/2020   HGB 14.6 12/14/2020   HCT 44.9 12/14/2020   MCV 95.9 12/14/2020   PLT 183 12/14/2020    Lab Results  Component Value Date   CREATININE 1.07 12/14/2020   BUN 23 12/14/2020   NA 140 12/14/2020   K 4.4 12/14/2020   CL 104 12/14/2020   CO2 29 12/14/2020    Lab Results  Component Value Date   ALT 13 12/14/2020   AST 18 12/14/2020   ALKPHOS 65 12/04/2020   BILITOT 1.0 12/14/2020     Physical Exam: BP 112/60   Pulse 68   Ht 5\' 8"  (1.727 m)   Wt 188 lb 4 oz (85.4 kg)   SpO2 96%   BMI 28.62 kg/m  Constitutional: Pleasant,well-developed, male in no acute distress. HEENT: Normocephalic and atraumatic. Conjunctivae are normal. No scleral icterus. Neck supple.  Cardiovascular: Normal rate, regular rhythm.  Pulmonary/chest: Effort normal and breath sounds normal.  Abdominal: Soft, nondistended, nontender.  There are no masses palpable.  Extremities: no edema Lymphadenopathy: No cervical adenopathy noted. Neurological: Alert and oriented to person place and time. Skin: Skin is warm and dry. No rashes noted. Psychiatric: Normal mood and affect. Behavior is normal.   ASSESSMENT AND PLAN: 75 year old male here to reestablish care for the following:  History of appendicitis Abnormal CT scan GI tract History of colon polyps Fatty liver Anticoagulated  As above, patient with history of appendicitis in August which was managed conservatively with antibiotics given concern about his  transplanted kidney.  He did well with that and has no recurrent pain since then.  He has seen a Psychologist, sport and exercise in [place] who is recommending colonoscopy prior to appendectomy to ensure his cecum and appendix looks okay, rule out polypoid lesion.  I discussed colonoscopy with him including risks and benefits of the exam and anesthesia and he wants to proceed.  He will need approval to hold his Coumadin for 5  days prior to the exam, will reach out to his primary care regarding that.  We will await findings, if no significant polyps noted, he may not warrant any further surveillance colonoscopies given his age.  Otherwise we reviewed his history of fatty liver, no evidence of cirrhosis or advanced liver disease.  LFTs are normal.  Would continue to monitor this yearly.  He agreed with the plan, all questions answered.  Plan: - schedule colonoscopy - will seek approval to hold coumadin for 5 days prior to the exam - reviewed / discussed fatty liver with the patient, stable  Jolly Mango, MD [facility]  CC: [name], MD

## 2021-01-26 NOTE — Telephone Encounter (Signed)
Great thank you for the quick follow up.

## 2021-01-26 NOTE — Telephone Encounter (Signed)
Called and spoke to patient. He understands it is OK to hold Coumadin for procedure next week.

## 2021-01-26 NOTE — Patient Instructions (Addendum)
If you are age 73 or older, your body mass index should be between 23-30. Your Body mass index is 28.62 kg/m. If this is out of the aforementioned range listed, please consider follow up with your Primary Care Provider.  If you are age 95 or younger, your body mass index should be between 19-25. Your Body mass index is 28.62 kg/m. If this is out of the aformentioned range listed, please consider follow up with your Primary Care Provider.   ________________________________________________________  The [facility] providers would like to encourage you to use [facility] to communicate with providers for non-urgent requests or questions.  Due to long hold times on the telephone, sending your provider a message by [facility] may be a faster and more efficient way to get a response.  Please allow 48 business hours for a response.  Please remember that this is for non-urgent requests.  _______________________________________________________  Dennis Bast have been scheduled for a colonoscopy. Please follow written instructions given to you at your visit today.  Please pick up your prep supplies at the pharmacy within the next 1-3 days. If you use inhalers (even only as needed), please bring them with you on the day of your procedure.  We will contact you once we hear back from Dr. Melford Aase regarding your Coumadin.   Thank you for entrusting me with your care and for choosing [facility], Dr. Alcoa Cellar

## 2021-01-26 NOTE — Telephone Encounter (Signed)
   Robert Arnold 04-Mar-1946 [id number]  Dear Dr. Melford Aase:  We have scheduled the above named patient for a(n) Colonoscopy procedure with Dr. Havery Moros. Our records show that he is on anticoagulation therapy.  Please advise as to whether the patient may come off their therapy of Coumadin 5 days prior to their procedure which is scheduled for 01-31-21. Please note: Patient has started holding Coumadin on his own in anticipation of this procedure which he needs to have as soon as possible in order to address his appendicitis. Please see Dr. Doyne Keel office note from today's visit for more information.  Please route your response to Lemar Lofty, CMA or fax response to [phone]. Thank you for your prompt reply.  Sincerely,    [facility]

## 2021-01-26 NOTE — Telephone Encounter (Signed)
Called patient and left message regarding Dr. Idell Pickles response to request. Asked that he call back to confirm receipt. Ok to hold Coumadin until after procedure

## 2021-01-29 ENCOUNTER — Telehealth: Payer: Self-pay | Admitting: Gastroenterology

## 2021-01-29 ENCOUNTER — Other Ambulatory Visit: Payer: Self-pay | Admitting: Internal Medicine

## 2021-01-29 ENCOUNTER — Telehealth: Payer: Self-pay

## 2021-01-29 DIAGNOSIS — I482 Chronic atrial fibrillation, unspecified: Secondary | ICD-10-CM

## 2021-01-29 MED ORDER — WARFARIN SODIUM 5 MG PO TABS: ORAL_TABLET | ORAL | 3 refills | Status: DC

## 2021-01-29 NOTE — Telephone Encounter (Signed)
Called [facility] and spoke to Judson Roch. She indicated she would call patient now and will overnight Sutab OR transfer script to local pharmacy so that patient has his prep by tomorrow for Wednesday's procedure.  All phone numbers for patient were verified.

## 2021-01-29 NOTE — Telephone Encounter (Signed)
Please call the patient once done so he is aware of it per his request.

## 2021-01-29 NOTE — Telephone Encounter (Signed)
Called [facility]. They will reach out to the patient right away to arrange for him to get his Sutab by tomorrow

## 2021-01-29 NOTE — Telephone Encounter (Signed)
Patient called stating the pharmacy still has not contacted him about his prep for his colonoscopy on Wednesday.  He will need it no later than tomorrow.  Please call and advise.

## 2021-01-30 NOTE — Telephone Encounter (Signed)
Inbound call from patient called in stating Sutab is $173. States his pharmacy told him about a coupon he could be given to make the cost $40. Patient is asking a call back

## 2021-01-30 NOTE — Telephone Encounter (Signed)
Called and let patient know I will leave a sample for him at the front desk, 2nd floor for him to pick up. Patient agreed and will pick up today.

## 2021-01-31 ENCOUNTER — Ambulatory Visit (AMBULATORY_SURGERY_CENTER): Payer: Medicare Other | Admitting: Gastroenterology

## 2021-01-31 ENCOUNTER — Other Ambulatory Visit: Payer: Self-pay | Admitting: Internal Medicine

## 2021-01-31 ENCOUNTER — Encounter: Payer: Self-pay | Admitting: Gastroenterology

## 2021-01-31 ENCOUNTER — Other Ambulatory Visit: Payer: Self-pay

## 2021-01-31 VITALS — BP 115/41 | HR 55 | Temp 97.1°F | Resp 15 | Ht 68.0 in | Wt 188.0 lb

## 2021-01-31 DIAGNOSIS — D123 Benign neoplasm of transverse colon: Secondary | ICD-10-CM | POA: Diagnosis not present

## 2021-01-31 DIAGNOSIS — R933 Abnormal findings on diagnostic imaging of other parts of digestive tract: Secondary | ICD-10-CM | POA: Diagnosis not present

## 2021-01-31 DIAGNOSIS — R0609 Other forms of dyspnea: Secondary | ICD-10-CM

## 2021-01-31 DIAGNOSIS — Z8719 Personal history of other diseases of the digestive system: Secondary | ICD-10-CM | POA: Diagnosis not present

## 2021-01-31 DIAGNOSIS — R0600 Dyspnea, unspecified: Secondary | ICD-10-CM

## 2021-01-31 DIAGNOSIS — K5730 Diverticulosis of large intestine without perforation or abscess without bleeding: Secondary | ICD-10-CM

## 2021-01-31 DIAGNOSIS — Z860100 Personal history of colon polyps, unspecified: Secondary | ICD-10-CM

## 2021-01-31 DIAGNOSIS — R0601 Orthopnea: Secondary | ICD-10-CM

## 2021-01-31 MED ORDER — DEXAMETHASONE 4 MG PO TABS: ORAL_TABLET | ORAL | 0 refills | Status: DC

## 2021-01-31 MED ORDER — SODIUM CHLORIDE 0.9 % IV SOLN: 500.0000 mL | Freq: Once | INTRAVENOUS | Status: DC

## 2021-01-31 NOTE — Progress Notes (Signed)
Report given to PACU, vss 

## 2021-01-31 NOTE — Progress Notes (Signed)
History and Physical Interval Note: Patient seen on 10/21 - no interval changes since that time. History of colon polyps, adenoma removed 5 years ago, since had appendicitis managed conservatively with antibiotics in August. General surgery requesting colonoscopy to clear the cecum / AO prior to appendectomy given CT findings, ensure no polypoid lesion. No changes since I have last seen him. He has held his Coumadin for 5 days, history of atrial fibrillation which is stable. No interval changes since I have last seen him. He wishes to proceed.   01/31/2021 7:34 AM  Robert Arnold  has presented today for endoscopic procedure(s), with the diagnosis of  Encounter Diagnoses  Name Primary?   Personal history of colonic polyps Yes   Abnormal computed tomography of gastrointestinal tract   .  The various methods of evaluation and treatment have been discussed with the patient and/or family. After consideration of risks, benefits and other options for treatment, the patient has consented to  the endoscopic procedure(s).   The patient's history has been reviewed, patient examined, no change in status, stable for surgery.  I have reviewed the patient's chart and labs.  Questions were answered to the patient's satisfaction.    Jolly Mango, MD [facility]

## 2021-01-31 NOTE — Patient Instructions (Signed)
Handouts on diverticulosis and polyps given to patient. Await pathology results. Resume coumadin tonight Continue present medications and resume previous diet. Ok to proceed with appendectomy - no pathology noted in the cecum/AO, follow up with general surgery  Repeat colonoscopy for surveillance based on pathology results, may not need another one   YOU HAD AN ENDOSCOPIC PROCEDURE TODAY AT [facility]:   Refer to the procedure report that was given to you for any specific questions about what was found during the examination.  If the procedure report does not answer your questions, please call your gastroenterologist to clarify.  If you requested that your care partner not be given the details of your procedure findings, then the procedure report has been included in a sealed envelope for you to review at your convenience later.  YOU SHOULD EXPECT: Some feelings of bloating in the abdomen. Passage of more gas than usual.  Walking can help get rid of the air that was put into your GI tract during the procedure and reduce the bloating. If you had a lower endoscopy (such as a colonoscopy or flexible sigmoidoscopy) you may notice spotting of blood in your stool or on the toilet paper. If you underwent a bowel prep for your procedure, you may not have a normal bowel movement for a few days.  Please Note:  You might notice some irritation and congestion in your nose or some drainage.  This is from the oxygen used during your procedure.  There is no need for concern and it should clear up in a day or so.  SYMPTOMS TO REPORT IMMEDIATELY:  Following lower endoscopy (colonoscopy or flexible sigmoidoscopy):  Excessive amounts of blood in the stool  Significant tenderness or worsening of abdominal pains  Swelling of the abdomen that is new, acute  Fever of 100F or higher  For urgent or emergent issues, a gastroenterologist can be reached at any hour by calling [phone]. Do not use  MyChart messaging for urgent concerns.    DIET:  We do recommend a small meal at first, but then you may proceed to your regular diet.  Drink plenty of fluids but you should avoid alcoholic beverages for 24 hours.  ACTIVITY:  You should plan to take it easy for the rest of today and you should NOT DRIVE or use heavy machinery until tomorrow (because of the sedation medicines used during the test).    FOLLOW UP: Our staff will call the number listed on your records 48-72 hours following your procedure to check on you and address any questions or concerns that you may have regarding the information given to you following your procedure. If we do not reach you, we will leave a message.  We will attempt to reach you two times.  During this call, we will ask if you have developed any symptoms of COVID 19. If you develop any symptoms (ie: fever, flu-like symptoms, shortness of breath, cough etc.) before then, please call [phone].  If you test positive for Covid 19 in the 2 weeks post procedure, please call and report this information to [place].    If any biopsies were taken you will be contacted by phone or by letter within the next 1-3 weeks.  Please call us at [phone] if you have not heard about the biopsies in 3 weeks.    SIGNATURES/CONFIDENTIALITY: You and/or your care partner have signed paperwork which will be entered into your electronic medical record.  These signatures attest to  the fact that that the information above on your After Visit Summary has been reviewed and is understood.  Full responsibility of the confidentiality of this discharge information lies with you and/or your care-partner.

## 2021-01-31 NOTE — Progress Notes (Signed)
Called to room to assist during endoscopic procedure.  Patient ID and intended procedure confirmed with present staff. Received instructions for my participation in the procedure from the performing physician.  

## 2021-01-31 NOTE — Progress Notes (Signed)
[place] - VS

## 2021-01-31 NOTE — Op Note (Addendum)
[facility] Patient Name: Robert Arnold Procedure Date: 01/31/2021 7:23 AM MRN: [id number] Endoscopist: Remo Lipps P. Havery Moros , MD Age: 75 Referring MD:  Date of Birth: 1946-03-13 Gender: Male Account #: [id number] Procedure:                Colonoscopy Indications:              Abnormal CT of the GI tract - history of                            appendicitis - managed conservatively with                            antibiotics and no surgery yet - history of renal                            transplant, history of polyps - adenoma removed 5                            years ago. General surgery requesting pre-operative                            colonoscopy to ensure appendiceal orifice and cecum                            okay prior to surgery Medicines:                Monitored Anesthesia Care Procedure:                Pre-Anesthesia Assessment:                           - Prior to the procedure, a History and Physical                            was performed, and patient medications and                            allergies were reviewed. The patient's tolerance of                            previous anesthesia was also reviewed. The risks                            and benefits of the procedure and the sedation                            options and risks were discussed with the patient.                            All questions were answered, and informed consent                            was obtained. Prior Anticoagulants: The patient has  taken Coumadin (warfarin), last dose was 5 days                            prior to procedure. ASA Grade Assessment: III - A                            patient with severe systemic disease. After                            reviewing the risks and benefits, the patient was                            deemed in satisfactory condition to undergo the                            procedure.                            After obtaining informed consent, the colonoscope                            was passed under direct vision. Throughout the                            procedure, the patient's blood pressure, pulse, and                            oxygen saturations were monitored continuously. The                            CF [id number] #[id number] was introduced through the anus                            and advanced to the the terminal ileum, with                            identification of the appendiceal orifice and IC                            valve. The colonoscopy was performed without                            difficulty. The patient tolerated the procedure                            well. The quality of the bowel preparation was                            adequate. The terminal ileum, ileocecal valve,                            appendiceal orifice, and rectum were photographed. Scope In: 7:42:59 AM Scope Out: 8:00:57 AM Scope Withdrawal Time: 0 hours 13 minutes 48 seconds  Total Procedure Duration:  0 hours 17 minutes 58 seconds  Findings:                 The perianal and digital rectal examinations were                            normal.                           The terminal ileum appeared normal.                           A diminutive polyp was found in the proximal                            transverse colon. The polyp was sessile. The polyp                            was removed with a cold snare. Resection and                            retrieval were complete.                           A few small-mouthed diverticula were found in the                            sigmoid colon.                           Internal hemorrhoids were found during                            retroflexion. The hemorrhoids were small.                           The exam was otherwise without abnormality.                            Superficial erythema noted in the distal rectum,                            most likely  bowel prep artifiact. The appendiceal                            orifice and cecum were normal Complications:            No immediate complications. Estimated blood loss:                            Minimal. Estimated Blood Loss:     Estimated blood loss was minimal. Impression:               - The examined portion of the ileum was normal.                           - One diminutive polyp in the proximal transverse  colon, removed with a cold snare. Resected and                            retrieved.                           - Diverticulosis in the sigmoid colon.                           - Internal hemorrhoids.                           - Superficial mild rectal erythema, suspect bowel                            prep artifact                           - Normal appendiceal orifice and cecum.                           - The examination was otherwise normal. Recommendation:           - Patient has a contact number available for                            emergencies. The signs and symptoms of potential                            delayed complications were discussed with the                            patient. Return to normal activities tomorrow.                            Written discharge instructions were provided to the                            patient.                           - Resume previous diet.                           - Continue present medications.                           - Resume Coumadin tonight                           - Await pathology results.                           - Okay to proceed with appendectomy - no pathology                            noted in the cecum / AO, follow up with general  surgery Remo Lipps P. Havery Moros, MD 01/31/2021 8:08:18 AM This report has been signed electronically.

## 2021-02-01 ENCOUNTER — Ambulatory Visit
Admission: RE | Admit: 2021-02-01 | Discharge: 2021-02-01 | Disposition: A | Discharge status: Home / Self Care | Payer: Medicare Other | Source: Ambulatory Visit | Attending: Internal Medicine | Admitting: Internal Medicine

## 2021-02-01 DIAGNOSIS — R0601 Orthopnea: Secondary | ICD-10-CM

## 2021-02-01 DIAGNOSIS — R0600 Dyspnea, unspecified: Secondary | ICD-10-CM

## 2021-02-01 DIAGNOSIS — R0609 Other forms of dyspnea: Secondary | ICD-10-CM

## 2021-02-02 ENCOUNTER — Telehealth: Payer: Self-pay

## 2021-02-02 NOTE — Telephone Encounter (Signed)
  Follow up Call-  Call back number 01/31/2021  Post procedure Call Back phone  # #[phone] cell  Permission to leave phone message Yes  Some recent data might be hidden     Patient questions:  Do you have a fever, pain , or abdominal swelling? No. Pain Score  0 *  Have you tolerated food without any problems? Yes.    Have you been able to return to your normal activities? Yes.    Do you have any questions about your discharge instructions: Diet   No. Medications  No. Follow up visit  No.  Do you have questions or concerns about your Care? No.  Actions: * If pain score is 4 or above: No action needed, pain <4.

## 2021-02-03 NOTE — Progress Notes (Signed)
============================================================ ============================================================  -    CXR  shows lungs are clear      without signs of pneumonia or Heart failure   ============================================================ ============================================================

## 2021-02-07 ENCOUNTER — Other Ambulatory Visit: Payer: Self-pay | Admitting: Internal Medicine

## 2021-02-07 DIAGNOSIS — Z940 Kidney transplant status: Secondary | ICD-10-CM

## 2021-02-07 DIAGNOSIS — K352 Acute appendicitis with generalized peritonitis, without abscess: Secondary | ICD-10-CM

## 2021-02-10 ENCOUNTER — Ambulatory Visit
Admission: RE | Admit: 2021-02-10 | Discharge: 2021-02-10 | Disposition: A | Discharge status: Home / Self Care | Payer: Medicare Other | Source: Ambulatory Visit | Attending: Internal Medicine | Admitting: Internal Medicine

## 2021-02-10 DIAGNOSIS — K352 Acute appendicitis with generalized peritonitis, without abscess: Secondary | ICD-10-CM

## 2021-02-10 DIAGNOSIS — Z940 Kidney transplant status: Secondary | ICD-10-CM

## 2021-02-15 ENCOUNTER — Ambulatory Visit: Payer: Medicare Other | Admitting: Internal Medicine

## 2021-02-15 ENCOUNTER — Encounter: Payer: Self-pay | Admitting: Internal Medicine

## 2021-03-16 NOTE — Progress Notes (Signed)
3 MONTH FOLLOW UP Assessment:   Essential hypertension - continue medications, DASH diet, exercise and monitor at home. Call if greater than 130/80.  -     CBC with Differential/Platelet  PAF (paroxysmal atrial fibrillation) (HCC) Check INR q month and will adjust medication according to labs.  Discussed if patient falls to immediately contact office or go to ER. Discussed foods that can increase or decrease Coumadin levels. Patient understands to call the office before starting a new medication. Follow up in one month.   Chronic systolic heart failure ([place]) Improved on recent ECHO 04/2018 Follows with cardiology  Control blood pressure, cholesterol, glucose, increase exercise.   Weights are down, appears euvolemic  Thrombocytopenia (HCC) Check CBC at routine visits  Medication management Check routine labs   Long term current use of anticoagulant therapy Monitor INR  Type 2 Diabetes Mellitus Education: Reviewed 'ABCs' of diabetes management (respective goals in parentheses):  A1C (<7), blood pressure (<130/80), and cholesterol (LDL <70) Eye Exam yearly and Dental Exam every 6 months - UTD  Dietary recommendations Physical Activity recommendations  CKD stage 2 due to type 2 diabetes mellitus ([place]) Discussed general issues about diabetes pathophysiology and management., Educational material distributed., Suggested low cholesterol diet., Encouraged aerobic exercise., Discussed foot care., Reminded to get yearly retinal exam.  Mild episode of recurrent major depressive disorder, in remission ([place]) Continue medications; may try reducing wellbutrin dose, sleep hygiene reviewed, keep log Lifestyle discussed: diet/exerise, sleep hygiene, stress management, hydration   Overweight - BMI 28 - long discussion about weight loss, diet, and exercise -recommended diet heavy in fruits and veggies and low in animal meats, cheeses, and dairy products  Hx of renal transplant; AV fistula  ([place]) S/p transplant in 2013, doing well since on on myfortic and prograf (follows with [facility]), Dr. Donnetta Hutching PRN for retained AV fistula   Cough- non-specific-  get on PPI, may need to add H2/refer GI. Get on allergy pill, voice rest, suppress cough with OTC sugar free candy and RX med.   Not on ACEi or BB Follow up 1 month.    Orders Placed This Encounter  Procedures   CBC with Differential/Platelet   COMPLETE METABOLIC PANEL WITH GFR   Magnesium   Lipid panel   TSH   Hemoglobin A1c   Protime-INR     Over 30 minutes of exam, counseling, chart review, and critical decision making was performed  Future Appointments  Date Time Provider [place]  05/16/2021  2:00 PM Liane Comber, NP [facility] None     Subjective:  Robert Arnold is a 75 y.o. male who presents for 3 month follow up for T2DM, hld, htn, CKD, overweight, vit D def, CHF and a. Fib (coumadin monitoring).    He has history of depression, well controlled with wellbutrin 300 mg daily and trazodone 50 mg at night. He notes has had some more difficulty falling asleep, but does wake up feeling refreshed.   He notes brief daily dry cough, persistent several months, hasn't tried any allergy/GERD tx, also notes some persistent mild hoarseness/vocal quality change. Denies other notable allergy or GERD sx.   BMI is Body mass index is 28.89 kg/m., he has been working on diet, admits exercise has been limited with weather. Does have a recumbent bike, would like to work up to 30 min 5 days a week. Walks in park when weather is good. He drinks 1-2 cups of coffee. Minimal alochol. Drinks lots of water. Stress levels are low.  Wt Readings from Last 3 Encounters:  03/20/21 190 lb (86.2 kg)  01/31/21 188 lb (85.3 kg)  01/26/21 188 lb 4 oz (85.4 kg)   He has p. A. Fib, on coumadin; hx of CAD s/p stenting 09/2011 and CABG 02/2012, has done well since. 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  He has hx of systolic CHF,  improved/resolved on most recent ECHO 04/2018 however with diastolic dysfunction and LVEF 50-55%.  Follows with Dr. Acie Fredrickson.   He has history of Afib and is on coumadin, he is on 5 mg daily, within goal range; no nose bleeds, blood stool/urine. No missed doses, no ABX.  Lab Results  Component Value Date   INR 2.5 (H) 01/15/2021   INR 1.7 (H) 12/14/2020   INR 1.2 12/05/2020   His blood pressure has been controlled at home, today their BP is BP: 130/80 He does workout, walking 4 days/week. He denies chest pain, shortness of breath, dizziness.    He is on cholesterol medication (atorvastatin 40 mg daily) and denies myalgias. His cholesterol is at goal. The cholesterol last visit was:   Lab Results  Component Value Date   CHOL 147 05/16/2020   HDL 64 05/16/2020   LDLCALC 66 05/16/2020   TRIG 87 05/16/2020   CHOLHDL 2.3 05/16/2020    He has been working on diet and exercise for well controlled T2 diabetes on novolin 70/30 22 units AM, 12-15 units units at night, and denies increased appetite, nausea, paresthesia of the feet, polydipsia, polyuria and visual disturbances.  Reports fasting around 80-140.  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  On prograf and myfortic, follows [facility]  Now back in CKD 2 stage;  Lab Results  Component Value Date   GFRNONAA >60 12/05/2020   Patient is on Vitamin D supplement, alternates 10000 IU and 5000 IU every other day.  Lab Results  Component Value Date   VD25OH 80 07/19/2019     Patient is on allopurinol for gout and does not report a recent flare.  Lab Results  Component Value Date   LABURIC 3.3 (L) 05/16/2020   He notes stream is slow to start; denies nocturia, straining, dribbling. Declined med and monitoring for now.  Lab  Results  Component Value Date   PSA <0.1 04/05/2019   PSA <0.1 03/03/2018   PSA <0.1 01/31/2017      Medication Review: Current Outpatient Medications on File Prior to Visit  Medication Sig Dispense Refill   allopurinol (ZYLOPRIM) 300 MG tablet Take 1/2 tablet Daily  to Prevent  Gout (Patient taking differently: Take 300 mg by mouth daily.) 90 tablet 3   Ascorbic Acid (VITAMIN C) 1000 MG tablet Take 1,000 mg by mouth daily.     aspirin EC 81 MG tablet Take 81 mg by mouth daily.     atorvastatin (LIPITOR) 40 MG tablet TAKE ONE TABLET BY MOUTH DAILY FOR CHOLESTEROL 90 tablet 0   B Complex-Biotin-FA (B COMPLETE) TABS Take 1 tablet by mouth daily.     Black Pepper-Turmeric (TURMERIC COMPLEX/BLACK PEPPER PO) Take 1,500 mg by mouth daily.     buPROPion (WELLBUTRIN XL) 300 MG 24 hr tablet Take  1 tablet  Daily for Mood, Focus & Concentration (Patient taking differently: Take  1 tablet  Daily for Mood, Focus & Concentration) 90 tablet 3   Calcium Carbonate-Vitamin D (OYSTER SHELL CALCIUM/D) 250-125 MG-UNIT TABS Take 1 tablet by mouth daily.     finasteride (PROSCAR) 5 MG tablet Take  1 tablet  Daily  for Prostate 90 tablet 1   gabapentin (NEURONTIN) 300 MG capsule TAKE ONE CAPSULE BY MOUTH THREE TIMES A DAY AS NEEDED FOR PAIN 270 capsule 1   glucose blood (FREESTYLE TEST STRIPS) test strip Check blood sugar 3 to 4 times daily for medication regulation. 450 each PRN   insulin NPH-regular Human (NOVOLIN 70/30) (70-30) 100 UNIT/ML injection Take 20-30 units 2 x day or as directed (Patient taking differently: Inject 12-22 Units into the skin in the morning and at bedtime. 22 units in the AM, 12-15 units in the PM.) 10 mL 99   latanoprost (XALATAN) 0.005 % ophthalmic solution Place 1 drop into both eyes at bedtime.   4   MAGNESIUM-OXIDE 400 (241.3 Mg) MG tablet Take 400 mg by mouth 2 (two) times daily.   6   mycophenolate (MYFORTIC) 360 MG TBEC EC tablet TAKE 1 TABLET BY MOUTH TWICE DAILY 180 tablet 1    Probiotic Product (PROBIOTIC DAILY PO) Take 1 capsule by mouth daily.     tacrolimus (PROGRAF) 0.5 MG capsule Take 0.5 mg by mouth 2 (two) times daily.     tacrolimus (PROGRAF) 1 MG capsule Take 1 mg by mouth 2 (two) times daily.     traZODone (DESYREL) 50 MG tablet Take  1 tablet  1 hour  before Bedtime  as needed for Sleep (Patient taking differently: Take  1 tablet  1 hour  before Bedtime  as needed for Sleep) 90 tablet 3   VITAMIN D PO Take 5,000-10,000 Units by mouth See admin instructions. Takes 10,000 unit capsules on MWF and 5,000 unit capsule 4 days a week.     warfarin (COUMADIN) 5 MG tablet Take 1 to 2 tablets  /day as directed to Prevent Blood Clots                                   /         TAKE ONE TO TWO TABLETS BY MOUTH 180 tablet 3   Zinc 50 MG CAPS Take 50 mg by mouth daily.     No current facility-administered medications on file prior to visit.    Allergies: Allergies  Allergen Reactions   Losartan Anaphylaxis   Crestor [Rosuvastatin] Other (See Comments)    Elevated LFT's   Lorazepam     Pt is unsure of reaction    Morphine And Related Other (See Comments)    Has no effect on pt     Current Problems (verified) has Chronic systolic heart failure ([place]); CKD stage 2 due to type 2 diabetes mellitus ([place]); Hyperlipidemia associated with type 2 diabetes mellitus ([place]); BPH with obstruction/lower urinary tract symptoms; Gout; PAF (paroxysmal atrial fibrillation) ([place]); Essential hypertension; Vitamin D deficiency; Anticoagulant long-term use; Renal Transplant, s/p 09/2011; Depression, major, in remission ([place]); Thrombocytopenia ([place]); CAD (coronary artery disease) of artery bypass graft; IgA nephropathy; Essential tremor; Overweight (BMI 25.0-29.9); Senile purpura ([place]); Type 2 diabetes mellitus with stage 2 chronic kidney disease, with long-term current use of insulin ([place]); Former smoker (quit 1992); HNP (herniated nucleus pulposus), cervical; S/P cervical spinal fusion;  Chronic hip pain, bilateral; Hypercoagulopathy ([place]); Mobitz I; A-V fistula ([place]); First degree AV block;  History of skin cancer; B12 deficiency; and Acute appendicitis on their problem list.  Surgical: He  has a past surgical history that includes Colonoscopy; Peritoneal catheter insertion (2011); AV fistula placement (2011); Kidney transplant (09/25/2011); Coronary angioplasty with stent; Coronary artery bypass graft (2013); and Anterior cervical decomp/discectomy fusion (N/A, 05/04/2018). Family His family history includes Alcohol abuse in his brother and maternal grandfather; Alcoholism in his brother; Alzheimer's disease in his brother; COPD in his brother and mother; Diabetes in his mother; Heart attack in his father; Kidney disease in his sister. Social history  He reports that he quit smoking about 30 years ago. His smoking use included cigarettes. He started smoking about 54 years ago. He has a 48.00 pack-year smoking history. He has never used smokeless tobacco. He reports current alcohol use. He reports that he does not use drugs.   Review of Systems  Constitutional:  Negative for malaise/fatigue and weight loss.  HENT:  Negative for hearing loss and tinnitus.   Eyes:  Negative for blurred vision and double vision.  Respiratory:  Positive for cough (dry, brief but daily and persistent). Negative for sputum production, shortness of breath and wheezing.   Cardiovascular:  Negative for chest pain, palpitations, orthopnea, claudication, leg swelling and PND.  Gastrointestinal:  Negative for abdominal pain, blood in stool, constipation, diarrhea, heartburn, melena, nausea and vomiting.  Genitourinary: Negative.   Musculoskeletal:  Negative for falls, joint pain and myalgias.  Skin:  Negative for rash.  Neurological:  Negative for dizziness, tingling, sensory change, weakness and headaches.  Endo/Heme/Allergies:  Negative for environmental allergies and polydipsia.  Psychiatric/Behavioral:  Negative.  Negative for depression, memory loss, substance abuse and suicidal ideas. The patient is not nervous/anxious and does not have insomnia.   All other systems reviewed and are negative.    Objective:   Today's Vitals   03/20/21 0838  BP: 130/80  Pulse: (!) 55  Temp: (!) 97 F (36.1 C)  SpO2: 99%  Weight: 190 lb (86.2 kg)   Body mass index is 28.89 kg/m.  General Appearance: Well nourished, in no apparent distress. Eyes: PERRLA, EOMs, conjunctiva no swelling or erythema Sinuses: No Frontal/maxillary tenderness ENT/Mouth: Ext aud canals clear, TMs without erythema, bulging. No erythema, swelling, or exudate on post pharynx.  Tonsils not swollen or erythematous. Hearing normal. Vocal quality not obviously hoarse.  Neck: Supple, thyroid normal.  Respiratory: Respiratory effort normal, BS equal bilaterally without rales, rhonchi, wheezing or stridor.  Cardio: Irregularly irregular, 3/6 early systolic blowing murmur. Brisk LE pulses, without edema. Has AV fistula to left forearm with thrill. Sensation intact, cap refill brisk Abdomen: Soft, + BS.  Non tender, no guarding, rebound, hernias, masses. Lymphatics: Non tender without lymphadenopathy.  Musculoskeletal: Full ROM, 5/5 strength, normal gait. Excepting right wrist in splint; distal sensation intact.  Skin: Warm, dry without rashes; small ecchymoses to bilateral upper extremities;  Neuro: Cranial nerves intact. Normal muscle tone, no cerebellar symptoms.  Psych: Awake and oriented X 3, normal affect, Insight and Judgment appropriate.    Izora Ribas, NP   03/20/2021

## 2021-03-20 ENCOUNTER — Encounter: Payer: Self-pay | Admitting: Adult Health

## 2021-03-20 ENCOUNTER — Other Ambulatory Visit: Payer: Self-pay

## 2021-03-20 ENCOUNTER — Ambulatory Visit (INDEPENDENT_AMBULATORY_CARE_PROVIDER_SITE_OTHER): Payer: Medicare Other | Admitting: Adult Health

## 2021-03-20 VITALS — BP 130/80 | HR 55 | Temp 97.0°F | Wt 190.0 lb

## 2021-03-20 DIAGNOSIS — I480 Paroxysmal atrial fibrillation: Secondary | ICD-10-CM | POA: Diagnosis not present

## 2021-03-20 DIAGNOSIS — Z7901 Long term (current) use of anticoagulants: Secondary | ICD-10-CM

## 2021-03-20 DIAGNOSIS — I25708 Atherosclerosis of coronary artery bypass graft(s), unspecified, with other forms of angina pectoris: Secondary | ICD-10-CM

## 2021-03-20 DIAGNOSIS — I10 Essential (primary) hypertension: Secondary | ICD-10-CM | POA: Diagnosis not present

## 2021-03-20 DIAGNOSIS — I5022 Chronic systolic (congestive) heart failure: Secondary | ICD-10-CM

## 2021-03-20 DIAGNOSIS — E559 Vitamin D deficiency, unspecified: Secondary | ICD-10-CM

## 2021-03-20 DIAGNOSIS — M1000 Idiopathic gout, unspecified site: Secondary | ICD-10-CM

## 2021-03-20 DIAGNOSIS — E1122 Type 2 diabetes mellitus with diabetic chronic kidney disease: Secondary | ICD-10-CM

## 2021-03-20 DIAGNOSIS — Z794 Long term (current) use of insulin: Secondary | ICD-10-CM

## 2021-03-20 DIAGNOSIS — E785 Hyperlipidemia, unspecified: Secondary | ICD-10-CM

## 2021-03-20 DIAGNOSIS — N182 Chronic kidney disease, stage 2 (mild): Secondary | ICD-10-CM

## 2021-03-20 DIAGNOSIS — D696 Thrombocytopenia, unspecified: Secondary | ICD-10-CM

## 2021-03-20 DIAGNOSIS — E1169 Type 2 diabetes mellitus with other specified complication: Secondary | ICD-10-CM

## 2021-03-20 DIAGNOSIS — E663 Overweight: Secondary | ICD-10-CM

## 2021-03-20 NOTE — Patient Instructions (Addendum)
Generally a cough is either coming from above or from below- so we will treat this OR it can be from irritation/viral cough  To treat the nasal drip: Try claritin, allegra or zyrtec daily - get from over the counter.   Can do a steroid nasal spray (such a flonase) 1-2 sparys at night each nostril. Remember to spray each nostril twice towards the outer part of your eye.  Do not sniff but instead pinch your nose and tilt your head back to help the medicine get into your sinuses.  The best time to do this is at bedtime. Stop if you get blurred vision or nose bleeds.   To treat the reflux Try OTC nexium 1 tab daily at night for 2 weeks - let me know if this helps  To stop irritation: Need to STOP the cough Do sugar free candy Can do robitussin or delsym if more coughing  VOICE REST is VERY important  If not better in 2 weeks will refer to ENT  Go to the ER or call the office if you get any chest pain, shortness of breath, severe  headache, leg swelling.    Common causes of cough OR hoarseness OR sore throat:   Allergies, Viral Infections, Acid Reflux and Bacterial Infections.    Allergies and viral infections cause a cough OR sore throat by post nasal drip and are often worse at night, can also have sneezing, lower grade fevers, clear/yellow mucus. This is best treated with allergy medications or nasal sprays.  Please get on allegra for 1-2 weeks The strongest is allegra or fexafinadine  Cheapest at [employer], [employer], [employer]   Bacterial infections are more severe than allergies or viral infections with fever, teeth pain, fatigue. This can be treated with prednisone and the same over the counter medication and after 7 days can be treated with an antibiotic.   Silent reflux/GERD can cause a cough OR sore throat OR hoarseness WITHOUT heart burn because the esophagus that goes to the stomach and trachea that goes to the lungs are very close and when you lay down the acid can irritate your throat  and lungs. This can cause hoarseness, cough, and wheezing. Please stop any alcohol or anti-inflammatories like aleve/advil/ibuprofen and start an over the counter Prilosec or omeprazole 1-2 times daily 57mins before food for 2 weeks, then switch to over the counter zantac/ratinidine or pepcid/famotadine once at night for 2 weeks.    sometimes irritation causes more irritation. Try voice rest, use sugar free cough drops to prevent coughing, and try to stop clearing your throat.   If you ever have a cough that does not go away after trying these things please make a follow up visit for further evaluation or we can refer you to a specialist. Or if you ever have shortness of breath or chest pain go to the ER.    Silent reflux: Not all heartburn burns...Marland KitchenMarland KitchenMarland Kitchen  What is LPR? Laryngopharyngeal reflux (LPR) or silent reflux is a condition in which acid that is made in the stomach travels up the esophagus (swallowing tube) and gets to the throat. Not everyone with reflux has a lot of heartburn or indigestion. In fact, many people with LPR never have heartburn. This is why LPR is called SILENT REFLUX, and the terms "Silent reflux" and "LPR" are often used interchangeably. Because LPR is silent, it is sometimes difficult to diagnose.  How can you tell if you have LPR?  Chronic hoarseness- Some people have hoarseness that  comes and goes throat clearing  Cough It can cause shortness of breath and cause asthma like symptoms. a feeling of a lump in the throat  difficulty swallowing a problem with too much nose and throat drainage.  Some people will feel their esophagus spasm which feels like their heart beating hard and fast, this will usually be after a meal, at rest, or lying down at night.    How do I treat this? Treatment for LPR should be individualized, and your doctor will suggest the best treatment for you. Generally there are several treatments for LPR: changing habits and diet to reduce reflux,   medications to reduce stomach acid, and  surgery to prevent reflux. Most people with LPR need to modify how and when they eat, as well as take some medication, to get well. Sometimes, nonprescription liquid antacids, such as Maalox, Gelucil and Mylanta are recommended. When used, these antacids should be taken four times each day - one tablespoon one hour after each meal and before bedtime. Dietary and lifestyle changes alone are not often enough to control LPR - medications that reduce stomach acid are also usually needed. These must be prescribed by our doctor.   TIPS FOR REDUCING REFLUX AND LPR Control your LIFE-STYLE and your DIET! If you use tobacco, QUIT.  Smoking makes you reflux. After every cigarette you have some LPR.  Don't wear clothing that is too tight, especially around the waist (trousers, corsets, belts).  Do not lie down just after eating...in fact, do not eat within three hours of bedtime.  You should be on a low-fat diet.  Limit your intake of red meat.  Limit your intake of butter.  Avoid fried foods.  Avoid chocolate  Avoid cheese.  Avoid eggs. Specifically avoid caffeine (especially coffee and tea), soda pop (especially cola) and mints.  Avoid alcoholic beverages, particularly in the evening. /

## 2021-03-21 ENCOUNTER — Encounter: Payer: Self-pay | Admitting: Adult Health

## 2021-03-21 LAB — LIPID PANEL
Cholesterol: 118 mg/dL (ref <200)
HDL: 60 mg/dL (ref >=40)
LDL Cholesterol (Calc): 44 mg/dL (calc)
Non-HDL Cholesterol (Calc): 58 mg/dL (calc) (ref <130)
Total CHOL/HDL Ratio: 2 (calc) (ref <5.0)
Triglycerides: 56 mg/dL (ref <150)

## 2021-03-21 LAB — CBC WITH DIFFERENTIAL/PLATELET
Absolute Monocytes: 545 cells/uL (ref 200–950)
Basophils Absolute: 70 cells/uL (ref 0–200)
Basophils Relative: 1.3 %
Eosinophils Absolute: 140 cells/uL (ref 15–500)
Eosinophils Relative: 2.6 %
HCT: 45.4 % (ref 38.5–50.0)
Hemoglobin: 15.3 g/dL (ref 13.2–17.1)
Lymphs Abs: 1269 cells/uL (ref 850–3900)
MCH: 31.7 pg (ref 27.0–33.0)
MCHC: 33.7 g/dL (ref 32.0–36.0)
MCV: 94.2 fL (ref 80.0–100.0)
MPV: 10.1 fL (ref 7.5–12.5)
Monocytes Relative: 10.1 %
Neutro Abs: 3375 cells/uL (ref 1500–7800)
Neutrophils Relative %: 62.5 %
Platelets: 128 10*3/uL — ABNORMAL LOW (ref 140–400)
RBC: 4.82 10*6/uL (ref 4.20–5.80)
RDW: 12.3 % (ref 11.0–15.0)
Total Lymphocyte: 23.5 %
WBC: 5.4 10*3/uL (ref 3.8–10.8)

## 2021-03-21 LAB — COMPLETE METABOLIC PANEL WITH GFR
AG Ratio: 1.7 (calc) (ref 1.0–2.5)
ALT: 34 U/L (ref 9–46)
AST: 29 U/L (ref 10–35)
Albumin: 3.8 g/dL (ref 3.6–5.1)
Alkaline phosphatase (APISO): 105 U/L (ref 35–144)
BUN: 19 mg/dL (ref 7–25)
CO2: 27 mmol/L (ref 20–32)
Calcium: 9.6 mg/dL (ref 8.6–10.3)
Chloride: 104 mmol/L (ref 98–110)
Creat: 0.89 mg/dL (ref 0.70–1.28)
Globulin: 2.2 g/dL (calc) (ref 1.9–3.7)
Glucose, Bld: 93 mg/dL (ref 65–99)
Potassium: 4.2 mmol/L (ref 3.5–5.3)
Sodium: 139 mmol/L (ref 135–146)
Total Bilirubin: 1.7 mg/dL — ABNORMAL HIGH (ref 0.2–1.2)
Total Protein: 6 g/dL — ABNORMAL LOW (ref 6.1–8.1)
eGFR: 91 mL/min/{1.73_m2} (ref >=60)

## 2021-03-21 LAB — PROTIME-INR
INR: 2.3 — ABNORMAL HIGH
Prothrombin Time: 22.1 s — ABNORMAL HIGH (ref 9.0–11.5)

## 2021-03-21 LAB — HEMOGLOBIN A1C
Hgb A1c MFr Bld: 6.1 % of total Hgb — ABNORMAL HIGH (ref <5.7)
Mean Plasma Glucose: 128 mg/dL
eAG (mmol/L): 7.1 mmol/L

## 2021-03-21 LAB — MAGNESIUM: Magnesium: 1.8 mg/dL (ref 1.5–2.5)

## 2021-03-21 LAB — TSH: TSH: 1.72 mIU/L (ref 0.40–4.50)

## 2021-04-02 ENCOUNTER — Other Ambulatory Visit: Payer: Self-pay | Admitting: Internal Medicine

## 2021-04-02 DIAGNOSIS — E1169 Type 2 diabetes mellitus with other specified complication: Secondary | ICD-10-CM

## 2021-04-02 MED ORDER — ATORVASTATIN CALCIUM 40 MG PO TABS: ORAL_TABLET | ORAL | 0 refills | Status: DC

## 2021-04-03 ENCOUNTER — Encounter: Payer: Self-pay | Admitting: Adult Health

## 2021-04-03 ENCOUNTER — Other Ambulatory Visit: Payer: Self-pay | Admitting: Internal Medicine

## 2021-04-03 ENCOUNTER — Emergency Department (HOSPITAL_BASED_OUTPATIENT_CLINIC_OR_DEPARTMENT_OTHER): Payer: Medicare Other

## 2021-04-03 ENCOUNTER — Inpatient Hospital Stay (HOSPITAL_BASED_OUTPATIENT_CLINIC_OR_DEPARTMENT_OTHER)
Admission: EM | Admit: 2021-04-03 | Discharge: 2021-04-10 | DRG: 339 | Disposition: A | Discharge status: Home / Self Care | Payer: Medicare Other | Attending: Family Medicine | Admitting: Family Medicine

## 2021-04-03 ENCOUNTER — Other Ambulatory Visit: Payer: Self-pay | Admitting: Adult Health

## 2021-04-03 ENCOUNTER — Other Ambulatory Visit: Payer: Self-pay

## 2021-04-03 ENCOUNTER — Encounter: Payer: Self-pay | Admitting: Internal Medicine

## 2021-04-03 ENCOUNTER — Encounter (HOSPITAL_BASED_OUTPATIENT_CLINIC_OR_DEPARTMENT_OTHER): Payer: Self-pay | Admitting: Emergency Medicine

## 2021-04-03 DIAGNOSIS — E11649 Type 2 diabetes mellitus with hypoglycemia without coma: Secondary | ICD-10-CM | POA: Diagnosis not present

## 2021-04-03 DIAGNOSIS — N028 Recurrent and persistent hematuria with other morphologic changes: Secondary | ICD-10-CM | POA: Diagnosis present

## 2021-04-03 DIAGNOSIS — K3580 Unspecified acute appendicitis: Secondary | ICD-10-CM | POA: Diagnosis present

## 2021-04-03 DIAGNOSIS — Z833 Family history of diabetes mellitus: Secondary | ICD-10-CM

## 2021-04-03 DIAGNOSIS — Z20822 Contact with and (suspected) exposure to covid-19: Secondary | ICD-10-CM | POA: Diagnosis present

## 2021-04-03 DIAGNOSIS — I255 Ischemic cardiomyopathy: Secondary | ICD-10-CM | POA: Diagnosis present

## 2021-04-03 DIAGNOSIS — Z7901 Long term (current) use of anticoagulants: Secondary | ICD-10-CM

## 2021-04-03 DIAGNOSIS — E785 Hyperlipidemia, unspecified: Secondary | ICD-10-CM | POA: Diagnosis present

## 2021-04-03 DIAGNOSIS — N400 Enlarged prostate without lower urinary tract symptoms: Secondary | ICD-10-CM | POA: Diagnosis present

## 2021-04-03 DIAGNOSIS — K3532 Acute appendicitis with perforation and localized peritonitis, without abscess: Secondary | ICD-10-CM | POA: Diagnosis not present

## 2021-04-03 DIAGNOSIS — R079 Chest pain, unspecified: Secondary | ICD-10-CM

## 2021-04-03 DIAGNOSIS — I110 Hypertensive heart disease with heart failure: Secondary | ICD-10-CM | POA: Diagnosis present

## 2021-04-03 DIAGNOSIS — I252 Old myocardial infarction: Secondary | ICD-10-CM

## 2021-04-03 DIAGNOSIS — I5032 Chronic diastolic (congestive) heart failure: Secondary | ICD-10-CM | POA: Diagnosis present

## 2021-04-03 DIAGNOSIS — I480 Paroxysmal atrial fibrillation: Secondary | ICD-10-CM | POA: Diagnosis present

## 2021-04-03 DIAGNOSIS — Z7982 Long term (current) use of aspirin: Secondary | ICD-10-CM

## 2021-04-03 DIAGNOSIS — I4891 Unspecified atrial fibrillation: Secondary | ICD-10-CM | POA: Diagnosis present

## 2021-04-03 DIAGNOSIS — I453 Trifascicular block: Secondary | ICD-10-CM | POA: Diagnosis present

## 2021-04-03 DIAGNOSIS — Z940 Kidney transplant status: Secondary | ICD-10-CM

## 2021-04-03 DIAGNOSIS — E860 Dehydration: Secondary | ICD-10-CM | POA: Diagnosis present

## 2021-04-03 DIAGNOSIS — I25810 Atherosclerosis of coronary artery bypass graft(s) without angina pectoris: Secondary | ICD-10-CM | POA: Diagnosis present

## 2021-04-03 DIAGNOSIS — I441 Atrioventricular block, second degree: Secondary | ICD-10-CM | POA: Diagnosis present

## 2021-04-03 DIAGNOSIS — Z794 Long term (current) use of insulin: Secondary | ICD-10-CM

## 2021-04-03 DIAGNOSIS — I472 Ventricular tachycardia, unspecified: Secondary | ICD-10-CM | POA: Diagnosis not present

## 2021-04-03 DIAGNOSIS — I2510 Atherosclerotic heart disease of native coronary artery without angina pectoris: Secondary | ICD-10-CM | POA: Diagnosis present

## 2021-04-03 DIAGNOSIS — E119 Type 2 diabetes mellitus without complications: Secondary | ICD-10-CM

## 2021-04-03 DIAGNOSIS — Z87891 Personal history of nicotine dependence: Secondary | ICD-10-CM

## 2021-04-03 DIAGNOSIS — M109 Gout, unspecified: Secondary | ICD-10-CM | POA: Diagnosis present

## 2021-04-03 DIAGNOSIS — Z8249 Family history of ischemic heart disease and other diseases of the circulatory system: Secondary | ICD-10-CM

## 2021-04-03 DIAGNOSIS — M542 Cervicalgia: Secondary | ICD-10-CM

## 2021-04-03 DIAGNOSIS — D849 Immunodeficiency, unspecified: Secondary | ICD-10-CM | POA: Diagnosis present

## 2021-04-03 DIAGNOSIS — Z955 Presence of coronary angioplasty implant and graft: Secondary | ICD-10-CM

## 2021-04-03 DIAGNOSIS — R0902 Hypoxemia: Secondary | ICD-10-CM | POA: Diagnosis present

## 2021-04-03 HISTORY — DX: Nonrheumatic aortic (valve) stenosis: I35.0

## 2021-04-03 HISTORY — DX: Thoracic aortic ectasia: I77.810

## 2021-04-03 HISTORY — DX: Trifascicular block: I45.3

## 2021-04-03 HISTORY — DX: Essential (primary) hypertension: I10

## 2021-04-03 HISTORY — DX: End stage renal disease: N18.6

## 2021-04-03 HISTORY — DX: Ischemic cardiomyopathy: I25.5

## 2021-04-03 LAB — I-STAT VENOUS BLOOD GAS, ED
Acid-Base Excess: 3 mmol/L — ABNORMAL HIGH (ref 0.0–2.0)
Bicarbonate: 27.6 mmol/L (ref 20.0–28.0)
Calcium, Ion: 1.16 mmol/L (ref 1.15–1.40)
HCT: 44 % (ref 39.0–52.0)
Hemoglobin: 15 g/dL (ref 13.0–17.0)
O2 Saturation: 74 %
Potassium: 5 mmol/L (ref 3.5–5.1)
Sodium: 137 mmol/L (ref 135–145)
TCO2: 29 mmol/L (ref 22–32)
pCO2, Ven: 40.9 mmHg — ABNORMAL LOW (ref 44.0–60.0)
pH, Ven: 7.436 — ABNORMAL HIGH (ref 7.250–7.430)
pO2, Ven: 38 mmHg (ref 32.0–45.0)

## 2021-04-03 LAB — PROTIME-INR
INR: 2.5 — ABNORMAL HIGH (ref 0.8–1.2)
Prothrombin Time: 27.2 seconds — ABNORMAL HIGH (ref 11.4–15.2)

## 2021-04-03 LAB — DIFFERENTIAL
Abs Immature Granulocytes: 0.03 10*3/uL (ref 0.00–0.07)
Basophils Absolute: 0 10*3/uL (ref 0.0–0.1)
Basophils Relative: 0 %
Eosinophils Absolute: 0 10*3/uL (ref 0.0–0.5)
Eosinophils Relative: 0 %
Immature Granulocytes: 0 %
Lymphocytes Relative: 5 %
Lymphs Abs: 0.5 10*3/uL — ABNORMAL LOW (ref 0.7–4.0)
Monocytes Absolute: 0.5 10*3/uL (ref 0.1–1.0)
Monocytes Relative: 6 %
Neutro Abs: 7.8 10*3/uL — ABNORMAL HIGH (ref 1.7–7.7)
Neutrophils Relative %: 89 %

## 2021-04-03 LAB — CBC
HCT: 44.1 % (ref 39.0–52.0)
Hemoglobin: 14.6 g/dL (ref 13.0–17.0)
MCH: 31.3 pg (ref 26.0–34.0)
MCHC: 33.1 g/dL (ref 30.0–36.0)
MCV: 94.6 fL (ref 80.0–100.0)
Platelets: 128 10*3/uL — ABNORMAL LOW (ref 150–400)
RBC: 4.66 MIL/uL (ref 4.22–5.81)
RDW: 12.8 % (ref 11.5–15.5)
WBC: 9.4 10*3/uL (ref 4.0–10.5)
nRBC: 0 % (ref 0.0–0.2)

## 2021-04-03 LAB — RESP PANEL BY RT-PCR (FLU A&B, COVID) ARPGX2
Influenza A by PCR: NEGATIVE
Influenza B by PCR: NEGATIVE
SARS Coronavirus 2 by RT PCR: NEGATIVE

## 2021-04-03 LAB — COMPREHENSIVE METABOLIC PANEL
ALT: 27 U/L (ref 0–44)
AST: 32 U/L (ref 15–41)
Albumin: 3.9 g/dL (ref 3.5–5.0)
Alkaline Phosphatase: 105 U/L (ref 38–126)
Anion gap: 10 (ref 5–15)
BUN: 23 mg/dL (ref 8–23)
CO2: 25 mmol/L (ref 22–32)
Calcium: 9.3 mg/dL (ref 8.9–10.3)
Chloride: 102 mmol/L (ref 98–111)
Creatinine, Ser: 0.82 mg/dL (ref 0.61–1.24)
GFR, Estimated: >60 mL/min (ref >60)
Glucose, Bld: 114 mg/dL — ABNORMAL HIGH (ref 70–99)
Potassium: 4.1 mmol/L (ref 3.5–5.1)
Sodium: 137 mmol/L (ref 135–145)
Total Bilirubin: 2.4 mg/dL — ABNORMAL HIGH (ref 0.3–1.2)
Total Protein: 6.4 g/dL — ABNORMAL LOW (ref 6.5–8.1)

## 2021-04-03 LAB — LACTIC ACID, PLASMA: Lactic Acid, Venous: 0.9 mmol/L (ref 0.5–1.9)

## 2021-04-03 LAB — URINALYSIS, ROUTINE W REFLEX MICROSCOPIC
Bilirubin Urine: NEGATIVE
Glucose, UA: NEGATIVE mg/dL
Hgb urine dipstick: NEGATIVE
Ketones, ur: 40 mg/dL — AB
Leukocytes,Ua: NEGATIVE
Nitrite: NEGATIVE
Specific Gravity, Urine: >1.046 — ABNORMAL HIGH (ref 1.005–1.030)
pH: 6 (ref 5.0–8.0)

## 2021-04-03 LAB — LIPASE, BLOOD: Lipase: <10 U/L — ABNORMAL LOW (ref 11–51)

## 2021-04-03 MED ORDER — IOHEXOL 300 MG/ML  SOLN
100.0000 mL | Freq: Once | INTRAMUSCULAR | Status: AC | PRN
  Administered 2021-04-03: 19:00:00 100 mL via INTRAVENOUS

## 2021-04-03 MED ORDER — PIPERACILLIN-TAZOBACTAM 3.375 G IVPB
3.3750 g | Freq: Three times a day (TID) | INTRAVENOUS | Status: DC
  Administered 2021-04-04 – 2021-04-10 (×20): 3.375 g via INTRAVENOUS
  Filled 2021-04-03 (×20): qty 50

## 2021-04-03 MED ORDER — HYDROMORPHONE HCL 1 MG/ML IJ SOLN
1.0000 mg | Freq: Once | INTRAMUSCULAR | Status: AC
  Administered 2021-04-03: 22:00:00 1 mg via INTRAVENOUS
  Filled 2021-04-03: qty 1

## 2021-04-03 MED ORDER — VITAMIN K1 10 MG/ML IJ SOLN
5.0000 mg | Freq: Once | INTRAVENOUS | Status: AC
  Administered 2021-04-03: 21:00:00 5 mg via INTRAVENOUS
  Filled 2021-04-03: qty 0.5

## 2021-04-03 MED ORDER — VITAMIN K1 10 MG/ML IJ SOLN: 10.0000 mg | Freq: Once | INTRAVENOUS | Status: DC

## 2021-04-03 MED ORDER — SODIUM CHLORIDE 0.9 % IV SOLN: INTRAVENOUS | Status: DC

## 2021-04-03 MED ORDER — HYDROMORPHONE HCL 1 MG/ML IJ SOLN
1.0000 mg | Freq: Once | INTRAMUSCULAR | Status: AC
  Administered 2021-04-03: 19:00:00 1 mg via INTRAVENOUS
  Filled 2021-04-03: qty 1

## 2021-04-03 MED ORDER — ONDANSETRON HCL 4 MG/2ML IJ SOLN
4.0000 mg | Freq: Once | INTRAMUSCULAR | Status: AC
  Administered 2021-04-03: 19:00:00 4 mg via INTRAVENOUS
  Filled 2021-04-03: qty 2

## 2021-04-03 MED ORDER — HYOSCYAMINE SULFATE SL 0.125 MG SL SUBL: 0.1250 mg | SUBLINGUAL_TABLET | SUBLINGUAL | 0 refills | Status: DC | PRN

## 2021-04-03 MED ORDER — PIPERACILLIN-TAZOBACTAM 3.375 G IVPB 30 MIN
3.3750 g | Freq: Once | INTRAVENOUS | Status: AC
  Administered 2021-04-03: 21:00:00 3.375 g via INTRAVENOUS
  Filled 2021-04-03: qty 50

## 2021-04-03 MED ORDER — SODIUM CHLORIDE 0.9 % IV BOLUS
500.0000 mL | Freq: Once | INTRAVENOUS | Status: AC
  Administered 2021-04-03: 19:00:00 500 mL via INTRAVENOUS

## 2021-04-03 NOTE — ED Triage Notes (Signed)
Pt from home BIB EMS , RLQ pain , HX diverticulitis. Right kidney failure

## 2021-04-03 NOTE — Plan of Care (Addendum)
[facility] transfer note:  75 yo M with h/o renal transplant, is on immunosuppressives.  In to ED with acute appendicitis.  EDP spoke with Dr. Bobbye Morton who will see pt on arrival at [facility] but wanted pt to be medical admit.  Transplant looks like its working great: BUN 23 creat 0.82.  Asking if we have any [facility] beds: have med-surg bed, will send pt stat to med-surg.  Pt on coumadin for a.fib with INR 2.5.  EDP going to call Dr. Bobbye Morton back and ask if we should give Vit K  [facility] will assume care on arrival to accepting facility. Until arrival, care as per EDP. However, [facility] available 24/7 for questions and assistance.  Nursing staff, please page [facility] and Consults [phone]) as soon as the patient arrives the hospital.

## 2021-04-03 NOTE — ED Provider Notes (Signed)
[facility] EMERGENCY DEPT Provider Note   CSN: [id number] Arrival date & time: 04/03/21  1637     History Chief Complaint  Patient presents with   Abdominal Pain    Robert Arnold is a 75 y.o. male.  This is a 75 y.o. male with significant medical history as below, including renal transplant at [facility] on prograf and myfortic, afib on warfarin who presents to the ED with complaint of right lower quadrant abdominal pain.  Patient ports onset of symptoms acutely this morning while at rest.  Pain described as sharp, stabbing, throbbing, unrelieved with home analgesics.  No pain to testicle or penis, no change in bowel or bladder function.  No chest pain or dyspnea.  No fevers or chills.  Nausea and 1 episode of emesis earlier while in the lobby.  No fevers or chills.  Patient reports pain is similar to when he had appendicitis in the past, was treated conservatively given history of renal transplant.  Did not receive appendectomy.  Last oral intake was this morning around 9 AM.  Compliant with his warfarin for afib.   The history is provided by the patient and the spouse. No language interpreter was used.      Past Medical History:  Diagnosis Date   Adenomatous colon polyp    Allergy    BPH (benign prostatic hyperplasia)    CHF (congestive heart failure) ([place])    Chronic kidney disease    due IgA nephropathy - s/p kidnet transplant 09/25/11   Diabetes mellitus type 2, controlled ([place])    FHx: heart disease 02/27/2018   Glaucoma    Gout    Histoplasmosis    on itraconazole for prophylaxis   MI (myocardial infarction) ([place]) 09/26/2011   NSTEMI (non-ST elevated myocardial infarction) ([place]) 11/26/2013   2013    OSA (obstructive sleep apnea)    PAF (paroxysmal atrial fibrillation) ([place])    s/p DC-CV in 6/13. Off coumadin due to ureteral bleed   Testosterone deficiency 02/27/2018    Patient Active Problem List   Diagnosis Date Noted   Acute appendicitis 12/02/2020    B12 deficiency 05/17/2020   History of skin cancer 05/16/2020   First degree AV block 05/12/2020   A-V fistula ([place]) 09/15/2019   [place] I 04/26/2019   Hypercoagulopathy ([place]) 12/21/2018   Chronic hip pain, bilateral 09/03/2018   S/P cervical spinal fusion 05/23/2018   HNP (herniated nucleus pulposus), cervical 05/04/2018   Type 2 diabetes mellitus with stage 2 chronic kidney disease, with long-term current use of insulin ([place]) 02/27/2018   Former smoker (quit 1992) 02/27/2018   Senile purpura ([place]) 01/23/2018   Overweight (BMI 25.0-29.9) 08/28/2017   Essential tremor 04/03/2017   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   CAD (coronary artery disease) of artery bypass graft 01/11/2016   Thrombocytopenia ([place]) 09/26/2015   Depression, major, in remission ([place]) 06/29/2014   Renal Transplant, s/p 09/2011 11/26/2013   Vitamin D deficiency 08/17/2013   Anticoagulant long-term use 08/17/2013   CKD stage 2 due to type 2 diabetes mellitus ([place])    Hyperlipidemia associated with type 2 diabetes mellitus ([place])    BPH with obstruction/lower urinary tract symptoms    Gout    PAF (paroxysmal atrial fibrillation) ([place])    Essential hypertension    Chronic systolic heart failure ([place]) 11/30/2011    Past Surgical History:  Procedure Laterality Date   ANTERIOR CERVICAL DECOMP/DISCECTOMY FUSION N/A 05/04/2018   Procedure: Cervical Five-Six Anterior cervical decompression/discectomy/fusion;  Surgeon: Jovita Gamma, MD;  Location: [facility] OR;  Service: Neurosurgery;  Laterality: N/A;  Cervical Five-Six Anterior cervical decompression/discectomy/fusion   AV FISTULA PLACEMENT  2011   Left forearm   COLONOSCOPY     CORONARY ANGIOPLASTY WITH STENT PLACEMENT     CORONARY ARTERY BYPASS GRAFT  2013   KIDNEY TRANSPLANT  09/25/2011   PERITONEAL CATHETER INSERTION  2011       Family History  Problem Relation Age of Onset   Diabetes Mother    COPD Mother    Heart attack Father        Unsure    Kidney disease  Sister        no longer speak   Alcoholism Brother    Alzheimer's disease Brother    Alcohol abuse Brother    COPD Brother    Alcohol abuse Maternal Grandfather    Colon cancer Neg Hx    Stomach cancer Neg Hx    Rectal cancer Neg Hx    Esophageal cancer Neg Hx    Liver cancer Neg Hx    Pancreatic cancer Neg Hx     Social History   Tobacco Use   Smoking status: Former    Packs/day: 2.00    Years: 24.00    Pack years: 48.00    Types: Cigarettes    Start date: 1968    Quit date: 11/29/1990    Years since quitting: 30.3   Smokeless tobacco: Never  Vaping Use   Vaping Use: Never used  Substance Use Topics   Alcohol use: Yes    Comment: 1-2 a month   Drug use: No    Home Medications Prior to Admission medications   Medication Sig Start Date End Date Taking? Authorizing Provider  allopurinol (ZYLOPRIM) 300 MG tablet Take 1/2 tablet Daily  to Prevent  Gout Patient taking differently: Take 300 mg by mouth daily. 11/30/20   Liane Comber, NP  Ascorbic Acid (VITAMIN C) 1000 MG tablet Take 1,000 mg by mouth daily.    [provider]  aspirin EC 81 MG tablet Take 81 mg by mouth daily.    [provider]  atorvastatin (LIPITOR) 40 MG tablet Take  1 tablet  Daily  for Cholesterol                                                     /                   TAKE ONE TABLET BY MOUTH 04/02/21   [name], MD  B Complex-Biotin-FA (B COMPLETE) TABS Take 1 tablet by mouth daily.    [provider]  Black Pepper-Turmeric (TURMERIC COMPLEX/BLACK PEPPER PO) Take 1,500 mg by mouth daily.    [provider]  buPROPion (WELLBUTRIN XL) 300 MG 24 hr tablet Take  1 tablet  Daily for Mood, Focus & Concentration Patient taking differently: Take  1 tablet  Daily for Mood, Focus & Concentration 09/20/20   [name], MD  Calcium Carbonate-Vitamin D (OYSTER SHELL CALCIUM/D) 250-125 MG-UNIT TABS Take 1 tablet by mouth daily.    [provider]   finasteride (PROSCAR) 5 MG tablet Take  1 tablet  Daily  for Prostate 12/28/20   [name], MD  gabapentin (NEURONTIN) 300 MG capsule TAKE ONE CAPSULE BY MOUTH THREE TIMES A DAY AS NEEDED FOR PAIN  12/14/20   [name], MD  glucose blood (FREESTYLE TEST STRIPS) test strip Check blood sugar 3 to 4 times daily for medication regulation. 12/04/15   [name], MD  Hyoscyamine Sulfate SL (LEVSIN/SL) 0.125 MG SUBL Place 0.125 mg under the tongue every 4 (four) hours as needed. For stomach cramping and diarrhea. 04/03/21   Liane Comber, NP  insulin NPH-regular Human (NOVOLIN 70/30) (70-30) 100 UNIT/ML injection Take 20-30 units 2 x day or as directed Patient taking differently: Inject 12-22 Units into the skin in the morning and at bedtime. 22 units in the AM, 12-15 units in the PM. 02/12/14   [name], MD  latanoprost (XALATAN) 0.005 % ophthalmic solution Place 1 drop into both eyes at bedtime.  04/11/16   [provider]  MAGNESIUM-OXIDE 400 (241.3 Mg) MG tablet Take 400 mg by mouth 2 (two) times daily.  12/29/15   [provider]  mycophenolate (MYFORTIC) 360 MG TBEC EC tablet TAKE 1 TABLET BY MOUTH TWICE DAILY 05/06/16   [name], MD  Probiotic Product (PROBIOTIC DAILY PO) Take 1 capsule by mouth daily.    [provider]  tacrolimus (PROGRAF) 0.5 MG capsule Take 0.5 mg by mouth 2 (two) times daily.    [provider]  tacrolimus (PROGRAF) 1 MG capsule Take 1 mg by mouth 2 (two) times daily.    [provider]  traZODone (DESYREL) 50 MG tablet Take  1 tablet  1 hour  before Bedtime  as needed for Sleep Patient taking differently: Take  1 tablet  1 hour  before Bedtime  as needed for Sleep 07/19/20   [name], MD  VITAMIN D PO Take 5,000-10,000 Units by mouth See admin instructions. Takes 10,000 unit capsules on MWF and 5,000 unit capsule 4 days a week.    [provider]  warfarin (COUMADIN) 5 MG tablet Take 1 to  2 tablets  /day as directed to Prevent Blood Clots                                   /         TAKE ONE TO TWO TABLETS BY MOUTH 01/29/21   [name], MD  Zinc 50 MG CAPS Take 50 mg by mouth daily.    [provider]    Allergies    Losartan, Crestor [rosuvastatin], Lorazepam, and Morphine and related  Review of Systems   Review of Systems  Constitutional:  Negative for chills and fever.  HENT:  Negative for facial swelling and trouble swallowing.   Eyes:  Negative for photophobia and visual disturbance.  Respiratory:  Negative for cough and shortness of breath.   Cardiovascular:  Negative for chest pain and palpitations.  Gastrointestinal:  Positive for abdominal pain, nausea and vomiting.  Endocrine: Negative for polydipsia and polyuria.  Genitourinary:  Negative for difficulty urinating and hematuria.  Musculoskeletal:  Negative for gait problem and joint swelling.  Skin:  Negative for pallor and rash.  Neurological:  Negative for syncope and headaches.  Psychiatric/Behavioral:  Negative for agitation and confusion.    Physical Exam Updated Vital Signs BP (!) 111/59    Pulse 78    Temp 99.4 F (37.4 C)    Resp 18    Wt 83.5 kg    SpO2 93%    BMI 27.98 kg/m   Physical Exam Vitals and nursing note reviewed.  Constitutional:  General: He is not in acute distress.    Appearance: He is well-developed.  HENT:     Head: Normocephalic and atraumatic.     Right Ear: External ear normal.     Left Ear: External ear normal.     Mouth/Throat:     Mouth: Mucous membranes are moist.  Eyes:     General: No scleral icterus. Cardiovascular:     Rate and Rhythm: Normal rate and regular rhythm.     Pulses: Normal pulses.     Heart sounds: Normal heart sounds.  Pulmonary:     Effort: Pulmonary effort is normal. No respiratory distress.     Breath sounds: Normal breath sounds.  Abdominal:     General: Abdomen is flat.     Palpations: Abdomen is soft.     Tenderness:  There is abdominal tenderness. There is no guarding or rebound.    Musculoskeletal:        General: Normal range of motion.     Cervical back: Normal range of motion.     Right lower leg: No edema.     Left lower leg: No edema.  Skin:    General: Skin is warm and dry.     Capillary Refill: Capillary refill takes less than 2 seconds.  Neurological:     Mental Status: He is alert and oriented to person, place, and time.  Psychiatric:        Mood and Affect: Mood normal.        Behavior: Behavior normal.    ED Results / Procedures / Treatments   Labs (all labs ordered are listed, but only abnormal results are displayed) Labs Reviewed  LIPASE, BLOOD - Abnormal; Notable for the following components:      Result Value   Lipase <10 (*)    All other components within normal limits  COMPREHENSIVE METABOLIC PANEL - Abnormal; Notable for the following components:   Glucose, Bld 114 (*)    Total Protein 6.4 (*)    Total Bilirubin 2.4 (*)    All other components within normal limits  CBC - Abnormal; Notable for the following components:   Platelets 128 (*)    All other components within normal limits  URINALYSIS, ROUTINE W REFLEX MICROSCOPIC - Abnormal; Notable for the following components:   Specific Gravity, Urine >1.046 (*)    Ketones, ur 40 (*)    Protein, ur TRACE (*)    All other components within normal limits  DIFFERENTIAL - Abnormal; Notable for the following components:   Neutro Abs 7.8 (*)    Lymphs Abs 0.5 (*)    All other components within normal limits  PROTIME-INR - Abnormal; Notable for the following components:   Prothrombin Time 27.2 (*)    INR 2.5 (*)    All other components within normal limits  I-STAT VENOUS BLOOD GAS, ED - Abnormal; Notable for the following components:   pH, Ven 7.436 (*)    pCO2, Ven 40.9 (*)    Acid-Base Excess 3.0 (*)    All other components within normal limits  RESP PANEL BY RT-PCR (FLU A&B, COVID) ARPGX2  LACTIC ACID, PLASMA   LACTIC ACID, PLASMA    EKG None  Radiology CT ABDOMEN PELVIS W CONTRAST  Result Date: 04/03/2021 CLINICAL DATA:  Right lower quadrant pain. EXAM: CT ABDOMEN AND PELVIS WITH CONTRAST TECHNIQUE: Multidetector CT imaging of the abdomen and pelvis was performed using the standard protocol following bolus administration of intravenous contrast. CONTRAST:  128mL OMNIPAQUE  IOHEXOL 300 MG/ML  SOLN COMPARISON:  February 10, 2021 FINDINGS: Lower chest: Multiple sternal wires and sternal fixation plates and screws are seen. No acute abnormality. Hepatobiliary: There is diffuse fatty infiltration of the liver parenchyma. No focal liver abnormality is seen. Multiple tiny gallstones are seen within the lumen of an otherwise normal-appearing gallbladder. Pancreas: Unremarkable. No pancreatic ductal dilatation or surrounding inflammatory changes. Spleen: Numerous punctate calcified granulomas are seen scattered throughout the spleen. Adrenals/Urinary Tract: Adrenal glands are unremarkable. The native kidneys are atrophic in appearance with diffuse renal cortical thinning. Multiple bilateral simple renal cysts of various sizes are seen. The largest measures approximately 4.0 cm x 3.8 cm and is seen within the lateral aspect of the mid right kidney. A renal transplant is seen within the pelvis on the right. Bladder is unremarkable. Stomach/Bowel: There is a very small hiatal hernia. The appendix is markedly thickened and inflamed. No evidence of bowel dilatation. A segment of decompressed small bowel is suspected along the anterior aspect of the cecum (axial CT images 43 through 50, CT series 2). Noninflamed diverticula are noted within the proximal to mid sigmoid colon. Vascular/Lymphatic: Aortic atherosclerosis. No enlarged abdominal or pelvic lymph nodes. Reproductive: Prostate is unremarkable. Other: No abdominal wall hernia or abnormality. No abdominopelvic ascites. Musculoskeletal: No acute or significant osseous  findings. IMPRESSION: 1. Findings consistent with acute appendicitis. Inflammation of the adjacent portion of the cecum cannot be excluded. 2. Cholelithiasis. 3. Fatty liver. 4. Multiple bilateral simple renal cysts of various sizes. 5. Right pelvic renal transplant. 6. Sigmoid diverticulosis. 7. Aortic atherosclerosis. Aortic Atherosclerosis (ICD10-I70.0). Electronically Signed   By: Virgina Norfolk M.D.   On: 04/03/2021 19:41   DG Chest Portable 1 View  Result Date: 04/03/2021 CLINICAL DATA:  Acute right lower quadrant abdominal pain. EXAM: PORTABLE CHEST 1 VIEW COMPARISON:  February 01, 2021. FINDINGS: Stable cardiomediastinal silhouette. Sternotomy wires are noted. Both lungs are clear. The visualized skeletal structures are unremarkable. IMPRESSION: No active disease. Electronically Signed   By: Marijo Conception M.D.   On: 04/03/2021 19:34    Procedures .Critical Care Performed by: Jeanell Sparrow, DO Authorized by: Jeanell Sparrow, DO   Critical care provider statement:    Critical care time (minutes):  30   Critical care time was exclusive of:  Separately billable procedures and treating other patients   Critical care was necessary to treat or prevent imminent or life-threatening deterioration of the following conditions:  Dehydration   Critical care was time spent personally by me on the following activities:  Development of treatment plan with patient or surrogate, discussions with consultants, evaluation of patient's response to treatment, examination of patient, ordering and review of laboratory studies, ordering and review of radiographic studies, ordering and performing treatments and interventions, pulse oximetry, re-evaluation of patient's condition and review of old charts   Care discussed with: admitting provider   Comments:     IV abx for acute appendicitis, discussion with consultants, urgent surgical eval   Medications Ordered in ED Medications  piperacillin-tazobactam  (ZOSYN) IVPB 3.375 g (0 g Intravenous Stopped 04/03/21 2118)    Followed by  piperacillin-tazobactam (ZOSYN) IVPB 3.375 g (has no administration in time range)  0.9 %  sodium chloride infusion ( Intravenous New Bag/Given 04/03/21 2232)  sodium chloride 0.9 % bolus 500 mL (0 mLs Intravenous Stopped 04/03/21 2040)  HYDROmorphone (DILAUDID) injection 1 mg (1 mg Intravenous Given 04/03/21 1905)  ondansetron (ZOFRAN) injection 4 mg (4 mg Intravenous Given 04/03/21 1904)  iohexol (OMNIPAQUE) 300 MG/ML solution 100 mL (100 mLs Intravenous Contrast Given 04/03/21 1920)  phytonadione (VITAMIN K) 5 mg in dextrose 5 % 50 mL IVPB (0 mg Intravenous Stopped 04/03/21 2247)  HYDROmorphone (DILAUDID) injection 1 mg (1 mg Intravenous Given 04/03/21 2205)    ED Course  I have reviewed the triage vital signs and the nursing notes.  Pertinent labs & imaging results that were available during my care of the patient were reviewed by me and considered in my medical decision making (see chart for details).  Clinical Course as of 04/03/21 2345  Tue Apr 03, 2021  2123 D/w Dr Bobbye Morton regarding INR, agreeable to giving vitamin K [SG]    Clinical Course User Index [SG] Jeanell Sparrow, DO   MDM Rules/Calculators/A&P                          CC: rlq abd pain, emesis x1, nausea  This patient complains of above; this involves an extensive number of treatment options and is a complaint that carries with it a high risk of complications and morbidity. Vital signs were reviewed. Serious etiologies considered.  Record review:  Previous records obtained and reviewed   Additional history obtained from spouse  Work up as above, notable for:  Labs & imaging results that were available during my care of the patient were reviewed by me and considered in my medical decision making.   I ordered imaging studies which included CT a/p and I independently visualized and interpreted imaging which showed acute appendicitis w/o  perforation  Management: Given IVF, analgesics, antiemetics  Start IV zosyn, NPO, maintenance ivf.   Patient with acute appendicitis noted on CT.  Discussed with general surgery Dr. Bobbye Morton who recommends medical admission with general surgery consult for surgical evaluation of acute appendicitis. Patient's INR is 2.5, he is on warfarin due to atrial fibrillation.  She is agreeable to giving vitamin K to help normalize INR.  Agreeable to Zosyn. Will keep NPO.  D/w Dr Hurley Cisco at [facility] who accepts patient for admission.         This chart was dictated using voice recognition software.  Despite best efforts to proofread,  errors can occur which can change the documentation meaning.    Final Clinical Impression(s) / ED Diagnoses Final diagnoses:  Acute appendicitis, unspecified acute appendicitis type  History of kidney transplant  Dehydration    Rx / DC Orders ED Discharge Orders     None        Jeanell Sparrow, DO 04/03/21 2345

## 2021-04-03 NOTE — ED Notes (Signed)
Pt. Was put on 2L of 02. Given pain medication and desat. To 86. After 2L back to 96%.

## 2021-04-03 NOTE — Progress Notes (Signed)
Pharmacy Antibiotic Note  Robert Arnold is a 75 y.o. male admitted on 04/03/2021 with  intra-abdominal infection .  Pharmacy has been consulted for zosyn dosing.  Plan: Zosyn 3.375g IV q8h (4 hour infusion) Trend WBC, Fever, Renal function, & Clinical course F/u cultures, clinical course, WBC, fever De-escalate when able  Weight: 83.5 kg (184 lb)  Temp (24hrs), Avg:99.4 F (37.4 C), Min:99.4 F (37.4 C), Max:99.4 F (37.4 C)  Recent Labs  Lab 04/03/21 1804 04/03/21 1916  WBC 9.4  --   CREATININE 0.82  --   LATICACIDVEN  --  0.9    Estimated Creatinine Clearance: 84.4 mL/min (by C-G formula based on SCr of 0.82 mg/dL).    Allergies  Allergen Reactions   Losartan Anaphylaxis   Crestor [Rosuvastatin] Other (See Comments)    Elevated LFT's   Lorazepam     Pt is unsure of reaction    Morphine And Related Other (See Comments)    Has no effect on pt     Antimicrobials this admission: zosyn 12/27 >>   Microbiology results: Pending  Thank you for allowing pharmacy to be a part of this patients care.  Lorelei Pont, PharmD, BCPS 04/03/2021 8:32 PM ED Clinical Pharmacist -  [phone]

## 2021-04-03 NOTE — ED Triage Notes (Signed)
Pt BIB [facility] from home for RLQ pain onset this am. Describes it as an aching pain, hx of diverticulitis but denies eating anything unusual.   Hx kidney transplant 2021      BP 158/100 HR 91 CBG 111

## 2021-04-03 NOTE — Consult Note (Signed)
Reason for Consult/Chief Complaint: acute appendicitis Consultant: Pearline Cables, DO  SAJJAD HONEA is an 75 y.o. male.   HPI: 15M with recurrent appendicitis, last episode August 2022, managed nonoperatively presents with RLQ abdominal pain, nausea, and vomiting since 12/27 AM. 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 H/o MMP including CAD, CABG, PAF on coumadin, heart block. Rec'd vitK at [facility]. After the last episode of appendicitis, was referred for colonoscopy, which has been scheduled.   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 Off coumadin due to ureteral bleed   Testosterone deficiency 02/27/2018    Past Surgical History:  Procedure Laterality Date   ANTERIOR CERVICAL DECOMP/DISCECTOMY FUSION N/A 05/04/2018   Procedure: Cervical Five-Six Anterior cervical decompression/discectomy/fusion;  Surgeon: Jovita Gamma, MD;  Location: [facility];  Service: Neurosurgery;  Laterality: N/A;  Cervical Five-Six Anterior cervical decompression/discectomy/fusion   AV FISTULA PLACEMENT  2011   Left forearm   COLONOSCOPY     CORONARY ANGIOPLASTY WITH STENT PLACEMENT     CORONARY ARTERY BYPASS GRAFT  2013   KIDNEY TRANSPLANT  09/25/2011   PERITONEAL CATHETER INSERTION  2011    Family History  Problem Relation Age of Onset   Diabetes Mother    COPD Mother    Heart attack Father        Unsure    Kidney disease Sister        no  longer speak   Alcoholism Brother    Alzheimer's disease Brother    Alcohol abuse Brother    COPD Brother    Alcohol abuse Maternal Grandfather    Colon cancer Neg Hx    Stomach cancer Neg Hx    Rectal cancer Neg Hx    Esophageal cancer Neg Hx    Liver cancer Neg Hx    Pancreatic cancer Neg Hx     Social History:  reports that he quit smoking about 30 years ago. His smoking use included cigarettes. He started smoking about 55 years ago. He has a 48.00 pack-year smoking history. He has never used smokeless tobacco. He reports current alcohol use. He reports that he does not use drugs.  Allergies:  Allergies  Allergen Reactions   Losartan Anaphylaxis   Crestor [Rosuvastatin] Other (See Comments)    Elevated LFT's   Lorazepam     Pt is unsure of reaction    Morphine And Related Other (See Comments)    Has no effect on pt     Medications: I have reviewed the patient's current medications.  Results for orders placed or performed during the hospital encounter of 04/03/21 (from the past 48 hour(s))  Lipase, blood     Status: Abnormal   Collection Time: 04/03/21  6:04 PM  Result Value Ref Range   Lipase <10 (L) 11 - 51 U/L    Comment: Performed at  [facility], [place], [place], [place] [address]  Comprehensive metabolic panel     Status: Abnormal   Collection Time: 04/03/21  6:04 PM  Result Value Ref Range   Sodium 137 135 - 145 mmol/L   Potassium 4.1 3.5 - 5.1 mmol/L   Chloride 102 98 - 111 mmol/L   CO2 25 22 - 32 mmol/L   Glucose, Bld 114 (H) 70 - 99 mg/dL    Comment: Glucose reference range applies only to samples taken after fasting for at least 8 hours.   BUN 23 8 - 23 mg/dL   Creatinine, Ser 0.82 0.61 - 1.24 mg/dL   Calcium 9.3 8.9 - 10.3 mg/dL   Total Protein 6.4 (L) 6.5 - 8.1 g/dL   Albumin 3.9 3.5 - 5.0 g/dL   AST 32 15 - 41 U/L   ALT 27 0 - 44 U/L   Alkaline Phosphatase 105 38 - 126 U/L   Total Bilirubin 2.4 (H) 0.3 - 1.2 mg/dL    GFR, Estimated >60 >60 mL/min    Comment: (NOTE) Calculated using the CKD-EPI Creatinine Equation (2021)    Anion gap 10 5 - 15    Comment: Performed at [facility], [address] [place], [place] [address]  CBC     Status: Abnormal   Collection Time: 04/03/21  6:04 PM  Result Value Ref Range   WBC 9.4 4.0 - 10.5 K/uL   RBC 4.66 4.22 - 5.81 MIL/uL   Hemoglobin 14.6 13.0 - 17.0 g/dL   HCT 44.1 39.0 - 52.0 %   MCV 94.6 80.0 - 100.0 fL   MCH 31.3 26.0 - 34.0 pg   MCHC 33.1 30.0 - 36.0 g/dL   RDW 12.8 11.5 - 15.5 %   Platelets 128 (L) 150 - 400 K/uL   nRBC 0.0 0.0 - 0.2 %    Comment: Performed at [facility], [address], [place], [place] [address]  Resp Panel by RT-PCR (Flu A&B, Covid) Nasopharyngeal Swab     Status: None   Collection Time: 04/03/21  6:49 PM   Specimen: Nasopharyngeal Swab; Nasopharyngeal(NP) swabs in vial transport medium  Result Value Ref Range   SARS Coronavirus 2 by RT PCR NEGATIVE NEGATIVE    Comment: (NOTE) SARS-CoV-2 target nucleic acids are NOT DETECTED.  The SARS-CoV-2 RNA is generally detectable in upper respiratory specimens during the acute phase of infection. The lowest concentration of SARS-CoV-2 viral copies this assay can detect is 138 copies/mL. A negative result does not preclude SARS-Cov-2 infection and should not be used as the sole basis for treatment or other patient management decisions. A negative result may occur with  improper specimen collection/handling, submission of specimen other than nasopharyngeal swab, presence of viral mutation(s) within the areas targeted by this assay, and inadequate number of viral copies(<138 copies/mL). A negative result must be combined with clinical observations, patient history, and epidemiological information. The expected result is Negative.  Fact Sheet for Patients:  EntrepreneurPulse.com.au  Fact Sheet for Healthcare Providers:   IncredibleEmployment.be  This test is no t yet approved or cleared by the [place] FDA and  has been authorized for detection and/or diagnosis of SARS-CoV-2 by FDA under an Emergency Use Authorization (EUA). This EUA will remain  in effect (meaning this test can be used) for the duration of the COVID-19 declaration under Section 564(b)(1) of the Act, 21 U.S.C.section 360bbb-3(b)(1), unless the authorization is terminated  or revoked sooner.  Influenza A by PCR NEGATIVE NEGATIVE   Influenza B by PCR NEGATIVE NEGATIVE    Comment: (NOTE) The Xpert Xpress SARS-CoV-2/FLU/RSV plus assay is intended as an aid in the diagnosis of influenza from Nasopharyngeal swab specimens and should not be used as a sole basis for treatment. Nasal washings and aspirates are unacceptable for Xpert Xpress SARS-CoV-2/FLU/RSV testing.  Fact Sheet for Patients: EntrepreneurPulse.com.au  Fact Sheet for Healthcare Providers: IncredibleEmployment.be  This test is not yet approved or cleared by the [place] FDA and has been authorized for detection and/or diagnosis of SARS-CoV-2 by FDA under an Emergency Use Authorization (EUA). This EUA will remain in effect (meaning this test can be used) for the duration of the COVID-19 declaration under Section 564(b)(1) of the Act, 21 U.S.C. section 360bbb-3(b)(1), unless the authorization is terminated or revoked.  Performed at [facility], [address] [place], [place] [address]   Differential     Status: Abnormal   Collection Time: 04/03/21  7:10 PM  Result Value Ref Range   Neutrophils Relative % 89 %   Neutro Abs 7.8 (H) 1.7 - 7.7 K/uL   Lymphocytes Relative 5 %   Lymphs Abs 0.5 (L) 0.7 - 4.0 K/uL   Monocytes Relative 6 %   Monocytes Absolute 0.5 0.1 - 1.0 K/uL   Eosinophils Relative 0 %   Eosinophils Absolute 0.0 0.0 - 0.5 K/uL   Basophils Relative 0 %    Basophils Absolute 0.0 0.0 - 0.1 K/uL   Immature Granulocytes 0 %   Abs Immature Granulocytes 0.03 0.00 - 0.07 K/uL    Comment: Performed at [facility], [address], [place], [place] [address]  Protime-INR     Status: Abnormal   Collection Time: 04/03/21  7:10 PM  Result Value Ref Range   Prothrombin Time 27.2 (H) 11.4 - 15.2 seconds   INR 2.5 (H) 0.8 - 1.2    Comment: (NOTE) INR goal varies based on device and disease states. Performed at [facility], [place], [place] [address]   Lactic acid, plasma     Status: None   Collection Time: 04/03/21  7:16 PM  Result Value Ref Range   Lactic Acid, Venous 0.9 0.5 - 1.9 mmol/L    Comment: Performed at [facility], [address] [place], [place] [address]  I-Stat venous blood gas, [facility] ED)     Status: Abnormal   Collection Time: 04/03/21  7:21 PM  Result Value Ref Range   pH, Ven 7.436 (H) 7.250 - 7.430   pCO2, Ven 40.9 (L) 44.0 - 60.0 mmHg   pO2, Ven 38.0 32.0 - 45.0 mmHg   Bicarbonate 27.6 20.0 - 28.0 mmol/L   TCO2 29 22 - 32 mmol/L   O2 Saturation 74.0 %   Acid-Base Excess 3.0 (H) 0.0 - 2.0 mmol/L   Sodium 137 135 - 145 mmol/L   Potassium 5.0 3.5 - 5.1 mmol/L   Calcium, Ion 1.16 1.15 - 1.40 mmol/L   HCT 44.0 39.0 - 52.0 %   Hemoglobin 15.0 13.0 - 17.0 g/dL   Sample type VENOUS    Comment VALUES EXPECTED, NO REPEAT     CT ABDOMEN PELVIS W CONTRAST  Result Date: 04/03/2021 CLINICAL DATA:  Right lower quadrant pain. EXAM: CT ABDOMEN AND PELVIS WITH CONTRAST TECHNIQUE: Multidetector CT imaging of the abdomen and pelvis was performed using the standard protocol following bolus administration of intravenous contrast. CONTRAST:  182mL OMNIPAQUE IOHEXOL 300 MG/ML  SOLN  COMPARISON:  February 10, 2021 FINDINGS: Lower chest: Multiple sternal wires and sternal fixation plates and screws are seen. No acute abnormality. Hepatobiliary: There is diffuse fatty  infiltration of the liver parenchyma. No focal liver abnormality is seen. Multiple tiny gallstones are seen within the lumen of an otherwise normal-appearing gallbladder. Pancreas: Unremarkable. No pancreatic ductal dilatation or surrounding inflammatory changes. Spleen: Numerous punctate calcified granulomas are seen scattered throughout the spleen. Adrenals/Urinary Tract: Adrenal glands are unremarkable. The native kidneys are atrophic in appearance with diffuse renal cortical thinning. Multiple bilateral simple renal cysts of various sizes are seen. The largest measures approximately 4.0 cm x 3.8 cm and is seen within the lateral aspect of the mid right kidney. A renal transplant is seen within the pelvis on the right. Bladder is unremarkable. Stomach/Bowel: There is a very small hiatal hernia. The appendix is markedly thickened and inflamed. No evidence of bowel dilatation. A segment of decompressed small bowel is suspected along the anterior aspect of the cecum (axial CT images 43 through 50, CT series 2). Noninflamed diverticula are noted within the proximal to mid sigmoid colon. Vascular/Lymphatic: Aortic atherosclerosis. No enlarged abdominal or pelvic lymph nodes. Reproductive: Prostate is unremarkable. Other: No abdominal wall hernia or abnormality. No abdominopelvic ascites. Musculoskeletal: No acute or significant osseous findings. IMPRESSION: 1. Findings consistent with acute appendicitis. Inflammation of the adjacent portion of the cecum cannot be excluded. 2. Cholelithiasis. 3. Fatty liver. 4. Multiple bilateral simple renal cysts of various sizes. 5. Right pelvic renal transplant. 6. Sigmoid diverticulosis. 7. Aortic atherosclerosis. Aortic Atherosclerosis (ICD10-I70.0). Electronically Signed   By: Virgina Norfolk M.D.   On: 04/03/2021 19:41   DG Chest Portable 1 View  Result Date: 04/03/2021 CLINICAL DATA:  Acute right lower quadrant abdominal pain. EXAM: PORTABLE CHEST 1 VIEW COMPARISON:   February 01, 2021. FINDINGS: Stable cardiomediastinal silhouette. Sternotomy wires are noted. Both lungs are clear. The visualized skeletal structures are unremarkable. IMPRESSION: No active disease. Electronically Signed   By: Marijo Conception M.D.   On: 04/03/2021 19:34    ROS 10 point review of systems is negative except as listed above in HPI.   Physical Exam Blood pressure 127/71, pulse 72, temperature 99.4 F (37.4 C), resp. rate 17, weight 83.5 kg, SpO2 95 %. Constitutional: well-developed, well-nourished HEENT: pupils equal, round, reactive to light, 20mm b/l, moist conjunctiva, external inspection of ears and nose normal, hearing intact Oropharynx: normal oropharyngeal mucosa, normal dentition Neck: no thyromegaly, trachea midline, no midline cervical tenderness to palpation Chest: breath sounds equal bilaterally, normal respiratory effort, no midline or lateral chest wall tenderness to palpation/deformity Abdomen: soft, mild RLQ TTP, no bruising, no hepatosplenomegaly GU: no blood at urethral meatus of penis, no scrotal masses or abnormality  Back: no wounds, no thoracic/lumbar spine tenderness to palpation, no thoracic/lumbar spine stepoffs Rectal: deferred Extremities: 2+ radial and pedal pulses bilaterally, intact motor and sensation bilateral UE and LE, no peripheral edema MSK: unable to assess gait/station, no clubbing/cyanosis of fingers/toes, normal ROM of all four extremities Skin: warm, dry, no rashes Psych: normal memory, normal mood/affect   Assessment/Plan: 69M with recurrent appendicitis. Complex medical history and extensive cardiac history. He seems to be medically optimized, however it would not be unreasonable to obtain a cardiology consult for pre-op risk stratification. I do believe operative management would be appropriate in this patient with recurrent appendicitis 4 months since the last episode. We had quite a frank conversation about operative and peri-operative  risks and even reviewed the ACS  risk calculator together. Informed consent was obtained after detailed explanation of risks, including bleeding, infection, abscess, staple line leak, stump appendicitis, injury to surrounding structures, and need for conversion to open procedure that would require laparotomy rather than traditional open appendectomy incision due to his transplanted kidney. We also discussed that his risk of bowel injury and conversion to open procedure are likely slightly higher than the general population given his history of PD. He verbalized understanding. All questions answered to the patient's satisfaction. I clearly explained to the patient that the final decision regarding surgery rests with the surgeon on call for today and he verbalized understanding of this as well.    Jesusita Oka, MD General and [facility]

## 2021-04-04 ENCOUNTER — Encounter (HOSPITAL_COMMUNITY)
Admission: EM | Disposition: A | Discharge status: Home / Self Care | Payer: Self-pay | Source: Home / Self Care | Attending: Internal Medicine

## 2021-04-04 ENCOUNTER — Encounter (HOSPITAL_COMMUNITY): Payer: Self-pay | Admitting: Anesthesiology

## 2021-04-04 ENCOUNTER — Encounter (HOSPITAL_COMMUNITY): Payer: Self-pay | Admitting: Internal Medicine

## 2021-04-04 DIAGNOSIS — E860 Dehydration: Secondary | ICD-10-CM | POA: Diagnosis present

## 2021-04-04 DIAGNOSIS — I453 Trifascicular block: Secondary | ICD-10-CM | POA: Diagnosis present

## 2021-04-04 DIAGNOSIS — Z955 Presence of coronary angioplasty implant and graft: Secondary | ICD-10-CM | POA: Diagnosis not present

## 2021-04-04 DIAGNOSIS — Z8249 Family history of ischemic heart disease and other diseases of the circulatory system: Secondary | ICD-10-CM | POA: Diagnosis not present

## 2021-04-04 DIAGNOSIS — K3580 Unspecified acute appendicitis: Secondary | ICD-10-CM

## 2021-04-04 DIAGNOSIS — I5032 Chronic diastolic (congestive) heart failure: Secondary | ICD-10-CM | POA: Diagnosis present

## 2021-04-04 DIAGNOSIS — Z794 Long term (current) use of insulin: Secondary | ICD-10-CM | POA: Diagnosis not present

## 2021-04-04 DIAGNOSIS — I480 Paroxysmal atrial fibrillation: Secondary | ICD-10-CM | POA: Diagnosis present

## 2021-04-04 DIAGNOSIS — I25708 Atherosclerosis of coronary artery bypass graft(s), unspecified, with other forms of angina pectoris: Secondary | ICD-10-CM | POA: Diagnosis not present

## 2021-04-04 DIAGNOSIS — E1165 Type 2 diabetes mellitus with hyperglycemia: Secondary | ICD-10-CM | POA: Diagnosis not present

## 2021-04-04 DIAGNOSIS — I25810 Atherosclerosis of coronary artery bypass graft(s) without angina pectoris: Secondary | ICD-10-CM | POA: Diagnosis present

## 2021-04-04 DIAGNOSIS — I25119 Atherosclerotic heart disease of native coronary artery with unspecified angina pectoris: Secondary | ICD-10-CM | POA: Diagnosis not present

## 2021-04-04 DIAGNOSIS — I2510 Atherosclerotic heart disease of native coronary artery without angina pectoris: Secondary | ICD-10-CM | POA: Diagnosis present

## 2021-04-04 DIAGNOSIS — E785 Hyperlipidemia, unspecified: Secondary | ICD-10-CM | POA: Diagnosis present

## 2021-04-04 DIAGNOSIS — Z7982 Long term (current) use of aspirin: Secondary | ICD-10-CM | POA: Diagnosis not present

## 2021-04-04 DIAGNOSIS — Z833 Family history of diabetes mellitus: Secondary | ICD-10-CM | POA: Diagnosis not present

## 2021-04-04 DIAGNOSIS — R0789 Other chest pain: Secondary | ICD-10-CM | POA: Diagnosis not present

## 2021-04-04 DIAGNOSIS — I482 Chronic atrial fibrillation, unspecified: Secondary | ICD-10-CM | POA: Diagnosis not present

## 2021-04-04 DIAGNOSIS — M109 Gout, unspecified: Secondary | ICD-10-CM | POA: Diagnosis present

## 2021-04-04 DIAGNOSIS — D849 Immunodeficiency, unspecified: Secondary | ICD-10-CM | POA: Diagnosis present

## 2021-04-04 DIAGNOSIS — K3532 Acute appendicitis with perforation and localized peritonitis, without abscess: Secondary | ICD-10-CM | POA: Diagnosis present

## 2021-04-04 DIAGNOSIS — I252 Old myocardial infarction: Secondary | ICD-10-CM | POA: Diagnosis not present

## 2021-04-04 DIAGNOSIS — E11649 Type 2 diabetes mellitus with hypoglycemia without coma: Secondary | ICD-10-CM | POA: Diagnosis not present

## 2021-04-04 DIAGNOSIS — R079 Chest pain, unspecified: Secondary | ICD-10-CM | POA: Diagnosis not present

## 2021-04-04 DIAGNOSIS — Z940 Kidney transplant status: Secondary | ICD-10-CM | POA: Diagnosis not present

## 2021-04-04 DIAGNOSIS — I441 Atrioventricular block, second degree: Secondary | ICD-10-CM | POA: Diagnosis present

## 2021-04-04 DIAGNOSIS — N028 Recurrent and persistent hematuria with other morphologic changes: Secondary | ICD-10-CM | POA: Diagnosis present

## 2021-04-04 DIAGNOSIS — I472 Ventricular tachycardia, unspecified: Secondary | ICD-10-CM | POA: Diagnosis not present

## 2021-04-04 DIAGNOSIS — Z87891 Personal history of nicotine dependence: Secondary | ICD-10-CM | POA: Diagnosis not present

## 2021-04-04 DIAGNOSIS — I110 Hypertensive heart disease with heart failure: Secondary | ICD-10-CM | POA: Diagnosis present

## 2021-04-04 DIAGNOSIS — Z20822 Contact with and (suspected) exposure to covid-19: Secondary | ICD-10-CM | POA: Diagnosis present

## 2021-04-04 LAB — GLUCOSE, CAPILLARY
Glucose-Capillary: 161 mg/dL — ABNORMAL HIGH (ref 70–99)
Glucose-Capillary: 160 mg/dL — ABNORMAL HIGH (ref 70–99)
Glucose-Capillary: 163 mg/dL — ABNORMAL HIGH (ref 70–99)

## 2021-04-04 LAB — CBC
HCT: 43 % (ref 39.0–52.0)
Hemoglobin: 14.5 g/dL (ref 13.0–17.0)
MCH: 32.3 pg (ref 26.0–34.0)
MCHC: 33.7 g/dL (ref 30.0–36.0)
MCV: 95.8 fL (ref 80.0–100.0)
Platelets: 125 10*3/uL — ABNORMAL LOW (ref 150–400)
RBC: 4.49 MIL/uL (ref 4.22–5.81)
RDW: 12.9 % (ref 11.5–15.5)
WBC: 12.7 10*3/uL — ABNORMAL HIGH (ref 4.0–10.5)
nRBC: 0 % (ref 0.0–0.2)

## 2021-04-04 LAB — PROTIME-INR
INR: 1.6 — ABNORMAL HIGH (ref 0.8–1.2)
Prothrombin Time: 19.5 seconds — ABNORMAL HIGH (ref 11.4–15.2)

## 2021-04-04 LAB — BASIC METABOLIC PANEL
Anion gap: 10 (ref 5–15)
BUN: 23 mg/dL (ref 8–23)
CO2: 22 mmol/L (ref 22–32)
Calcium: 9.1 mg/dL (ref 8.9–10.3)
Chloride: 101 mmol/L (ref 98–111)
Creatinine, Ser: 0.98 mg/dL (ref 0.61–1.24)
GFR, Estimated: >60 mL/min (ref >60)
Glucose, Bld: 181 mg/dL — ABNORMAL HIGH (ref 70–99)
Potassium: 4.5 mmol/L (ref 3.5–5.1)
Sodium: 133 mmol/L — ABNORMAL LOW (ref 135–145)

## 2021-04-04 LAB — LACTIC ACID, PLASMA: Lactic Acid, Venous: 1.4 mmol/L (ref 0.5–1.9)

## 2021-04-04 SURGERY — APPENDECTOMY, LAPAROSCOPIC
Anesthesia: General

## 2021-04-04 MED ORDER — ENOXAPARIN SODIUM 80 MG/0.8ML IJ SOSY
80.0000 mg | PREFILLED_SYRINGE | Freq: Two times a day (BID) | INTRAMUSCULAR | Status: DC
  Administered 2021-04-04 – 2021-04-10 (×11): 80 mg via SUBCUTANEOUS
  Filled 2021-04-04 (×12): qty 0.8

## 2021-04-04 MED ORDER — HYDROMORPHONE HCL 1 MG/ML IJ SOLN: 1.0000 mg | INTRAMUSCULAR | 0 refills | Status: DC | PRN

## 2021-04-04 MED ORDER — INSULIN ASPART PROT & ASPART (70-30 MIX) 100 UNIT/ML ~~LOC~~ SUSP
8.0000 [IU] | Freq: Two times a day (BID) | SUBCUTANEOUS | Status: DC
  Administered 2021-04-04: 12:00:00 8 [IU] via SUBCUTANEOUS

## 2021-04-04 MED ORDER — TACROLIMUS 1 MG PO CAPS: 1.0000 mg | ORAL_CAPSULE | Freq: Two times a day (BID) | ORAL | Status: DC

## 2021-04-04 MED ORDER — SODIUM CHLORIDE 0.9 % IV SOLN: INTRAVENOUS | Status: AC

## 2021-04-04 MED ORDER — ACETAMINOPHEN 325 MG PO TABS: 650.0000 mg | ORAL_TABLET | Freq: Four times a day (QID) | ORAL | Status: DC | PRN

## 2021-04-04 MED ORDER — NOVOLIN 70/30 (70-30) 100 UNIT/ML ~~LOC~~ SUSP: SUBCUTANEOUS | 99 refills | Status: DC

## 2021-04-04 MED ORDER — ENOXAPARIN SODIUM 80 MG/0.8ML IJ SOSY
80.0000 mg | PREFILLED_SYRINGE | Freq: Two times a day (BID) | INTRAMUSCULAR | Status: DC
Start: 2021-04-04 — End: 2021-04-10

## 2021-04-04 MED ORDER — INSULIN ASPART 100 UNIT/ML IJ SOLN: 0.0000 [IU] | INTRAMUSCULAR | Status: DC

## 2021-04-04 MED ORDER — TACROLIMUS 1 MG PO CAPS
1.5000 mg | ORAL_CAPSULE | Freq: Two times a day (BID) | ORAL | Status: DC
  Administered 2021-04-04 – 2021-04-10 (×13): 1.5 mg via ORAL
  Filled 2021-04-04: qty 1
  Filled 2021-04-04 (×3): qty 3
  Filled 2021-04-04: qty 1
  Filled 2021-04-04 (×2): qty 3
  Filled 2021-04-04: qty 1
  Filled 2021-04-04: qty 3
  Filled 2021-04-04 (×2): qty 1
  Filled 2021-04-04: qty 3
  Filled 2021-04-04 (×3): qty 1
  Filled 2021-04-04: qty 3
  Filled 2021-04-04: qty 1

## 2021-04-04 MED ORDER — PIPERACILLIN-TAZOBACTAM 3.375 G IVPB: 3.3750 g | Freq: Three times a day (TID) | INTRAVENOUS | Status: DC

## 2021-04-04 MED ORDER — ACETAMINOPHEN 650 MG RE SUPP: 650.0000 mg | Freq: Four times a day (QID) | RECTAL | Status: DC | PRN

## 2021-04-04 MED ORDER — MYCOPHENOLATE SODIUM 180 MG PO TBEC
360.0000 mg | DELAYED_RELEASE_TABLET | Freq: Two times a day (BID) | ORAL | Status: DC
  Administered 2021-04-04 – 2021-04-06 (×5): 360 mg via ORAL
  Filled 2021-04-04 (×7): qty 2

## 2021-04-04 MED ORDER — SODIUM CHLORIDE 0.9 % IV SOLN: 1000.0000 mL | INTRAVENOUS | 0 refills | Status: DC

## 2021-04-04 MED ORDER — HYDROMORPHONE HCL 1 MG/ML IJ SOLN
1.0000 mg | INTRAMUSCULAR | Status: DC | PRN
  Administered 2021-04-04 – 2021-04-09 (×15): 1 mg via INTRAVENOUS
  Filled 2021-04-04 (×17): qty 1

## 2021-04-04 NOTE — Plan of Care (Signed)

## 2021-04-04 NOTE — Progress Notes (Signed)
Seen after midnight  Patient stable No fever chills pain 6/10 Gen surg recs conservative management at this time with transfer to [facility] for consideration of surgery  They are ok with clear liquids Have resumed Immunosuppressants and placed on clear liquids Defer further planning dependant on dispo   No charge  Verneita Griffes, MD [facility] 10:56 AM

## 2021-04-04 NOTE — Plan of Care (Signed)
Goal: Patient will be remain pain free.

## 2021-04-04 NOTE — Discharge Summary (Deleted)
Physician Discharge Summary  LAAKEA PEREIRA GUR:[id number] DOB: 1945/11/29 DOA: 04/03/2021  PCP: [name], MD  Admit date: 04/03/2021 Discharge date: 04/04/2021  Time spent: 37 minutes  Recommendations for Outpatient Follow-up:  Patient being discharged to [facility] 12/28 for further subspecialty surgeon input under care of transplant surgeon Dr. Rupert Stacks  Discharge Diagnoses:  MAIN problem for hospitalization   Acute appendicitis  Please see below for itemized issues addressed in [place]- refer to other progress notes for clarity if needed  Discharge Condition: Improved  Diet recommendation: N.p.o. 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 fib, second-degree [place] Patient had appendicitis in August 2022 and was managed conservatively with IV antibiotics for 3 days and then oral He represented to [facility] ED with 10/10 pain and vomiting His pain was controlled with Dilaudid His Coumadin was held and he was transitioned to Lovenox General surgery saw the patient in consult and felt that they were conservatively managed and general surgeon Dr. Louanna Raw contacted [facility] and discussed with the transplant team He was accepted under the care of Dr. Rupert Stacks transplant surgeon and to their service  He was resumed on Prograf and CellCept at prior to admission doses but otherwise kept on clear liquid diet We cut back his insulin to half of the regular dose 8 units 7030 twice daily He was stable at the time of review for transfer and we defer to the surgeon subspecialists   Discharge Exam: Vitals:   04/04/21 0538 04/04/21 0724  BP: 104/67 (!) 115/55  Pulse: (!) 101 74  Resp:  16  Temp: 98 F (36.7  C) 97.8 F (36.6 C)  SpO2: 93% 94%    Subj on day of d/c   Awake coherent no distress no pain  General Exam on discharge  EOMI NCAT no focal deficit no icterus no pallor CTA B no added sound Abdomen slightly distended no rebound no guarding ROM intact no focal deficit S1-S2 seems to be regular No lower extremity edema  Discharge Instructions   Discharge Instructions     Diet - low sodium heart healthy   Complete by: As directed    Increase activity slowly   Complete by: As directed    Potential Condition Code 44   Complete by: As directed       Allergies as of 04/04/2021       Reactions   Losartan Anaphylaxis   Crestor [rosuvastatin] Other (See Comments)   Elevated LFT's   Lorazepam    Pt is unsure of reaction    Morphine And Related Other (See Comments)   Has no effect on pt         Medication List     STOP taking these medications    allopurinol 300 MG tablet Commonly known as: ZYLOPRIM   atorvastatin 40 MG tablet Commonly known as: LIPITOR   B Complete Tabs   finasteride 5 MG tablet Commonly known as: PROSCAR   gabapentin 300 MG capsule Commonly known as: NEURONTIN   glucose blood test strip Commonly known as: FREESTYLE TEST STRIPS   latanoprost 0.005 % ophthalmic solution Commonly known as: XALATAN   MAGnesium-Oxide 400 (241.3 Mg) MG tablet Generic drug: magnesium oxide  Oyster Shell Calcium/D 250-125 MG-UNIT Tabs   PROBIOTIC DAILY PO   traZODone 50 MG tablet Commonly known as: DESYREL   TURMERIC COMPLEX/BLACK PEPPER PO   vitamin C 1000 MG tablet   VITAMIN D PO   warfarin 5 MG tablet Commonly known as: COUMADIN   Zinc 50 MG Caps       TAKE these medications    aspirin EC 81 MG tablet Take 81 mg by mouth daily.   buPROPion 300 MG 24 hr tablet Commonly known as: WELLBUTRIN XL Take  1 tablet  Daily for Mood, Focus & Concentration   enoxaparin 80 MG/0.8ML injection Commonly known as: LOVENOX Inject 0.8 mLs (80  mg total) into the skin every 12 (twelve) hours.   HYDROmorphone 1 MG/ML injection Commonly known as: DILAUDID Inject 1 mL (1 mg total) into the vein every 4 (four) hours as needed for severe pain.   mycophenolate 360 MG Tbec EC tablet Commonly known as: MYFORTIC TAKE 1 TABLET BY MOUTH TWICE DAILY   NovoLIN 70/30 (70-30) 100 UNIT/ML injection Generic drug: insulin NPH-regular Human 8 u bid until taking diet What changed: additional instructions   piperacillin-tazobactam 3.375 GM/50ML IVPB Commonly known as: ZOSYN Inject 50 mLs (3.375 g total) into the vein every 8 (eight) hours.   sodium chloride 0.9 % infusion Inject 1,000 mLs into the vein continuous.   tacrolimus 1 MG capsule Commonly known as: PROGRAF Take 1 mg by mouth 2 (two) times daily. With  0.5g capsule for total of 1.5mg  twice daily   tacrolimus 0.5 MG capsule Commonly known as: PROGRAF Take 0.5 mg by mouth 2 (two) times daily. With 1mg  capsule for total of 1.5mg  twice daily       Allergies  Allergen Reactions   Losartan Anaphylaxis   Crestor [Rosuvastatin] Other (See Comments)    Elevated LFT's   Lorazepam     Pt is unsure of reaction    Morphine And Related Other (See Comments)    Has no effect on pt       The results of significant diagnostics from this hospitalization (including imaging, microbiology, ancillary and laboratory) are listed below for reference.    Significant Diagnostic Studies: CT ABDOMEN PELVIS W CONTRAST  Result Date: 04/03/2021 CLINICAL DATA:  Right lower quadrant pain. EXAM: CT ABDOMEN AND PELVIS WITH CONTRAST TECHNIQUE: Multidetector CT imaging of the abdomen and pelvis was performed using the standard protocol following bolus administration of intravenous contrast. CONTRAST:  133mL OMNIPAQUE IOHEXOL 300 MG/ML  SOLN COMPARISON:  February 10, 2021 FINDINGS: Lower chest: Multiple sternal wires and sternal fixation plates and screws are seen. No acute abnormality. Hepatobiliary: There  is diffuse fatty infiltration of the liver parenchyma. No focal liver abnormality is seen. Multiple tiny gallstones are seen within the lumen of an otherwise normal-appearing gallbladder. Pancreas: Unremarkable. No pancreatic ductal dilatation or surrounding inflammatory changes. Spleen: Numerous punctate calcified granulomas are seen scattered throughout the spleen. Adrenals/Urinary Tract: Adrenal glands are unremarkable. The native kidneys are atrophic in appearance with diffuse renal cortical thinning. Multiple bilateral simple renal cysts of various sizes are seen. The largest measures approximately 4.0 cm x 3.8 cm and is seen within the lateral aspect of the mid right kidney. A renal transplant is seen within the pelvis on the right. Bladder is unremarkable. Stomach/Bowel: There is a very small hiatal hernia. The appendix is markedly thickened and inflamed. No evidence of bowel dilatation. A segment of decompressed small bowel is suspected along the anterior aspect of the cecum (  axial CT images 43 through 50, CT series 2). Noninflamed diverticula are noted within the proximal to mid sigmoid colon. Vascular/Lymphatic: Aortic atherosclerosis. No enlarged abdominal or pelvic lymph nodes. Reproductive: Prostate is unremarkable. Other: No abdominal wall hernia or abnormality. No abdominopelvic ascites. Musculoskeletal: No acute or significant osseous findings. IMPRESSION: 1. Findings consistent with acute appendicitis. Inflammation of the adjacent portion of the cecum cannot be excluded. 2. Cholelithiasis. 3. Fatty liver. 4. Multiple bilateral simple renal cysts of various sizes. 5. Right pelvic renal transplant. 6. Sigmoid diverticulosis. 7. Aortic atherosclerosis. Aortic Atherosclerosis (ICD10-I70.0). Electronically Signed   By: Virgina Norfolk M.D.   On: 04/03/2021 19:41   DG Chest Portable 1 View  Result Date: 04/03/2021 CLINICAL DATA:  Acute right lower quadrant abdominal pain. EXAM: PORTABLE CHEST 1  VIEW COMPARISON:  February 01, 2021. FINDINGS: Stable cardiomediastinal silhouette. Sternotomy wires are noted. Both lungs are clear. The visualized skeletal structures are unremarkable. IMPRESSION: No active disease. Electronically Signed   By: Marijo Conception M.D.   On: 04/03/2021 19:34    Microbiology: Recent Results (from the past 240 hour(s))  Resp Panel by RT-PCR (Flu A&B, Covid) Nasopharyngeal Swab     Status: None   Collection Time: 04/03/21  6:49 PM   Specimen: Nasopharyngeal Swab; Nasopharyngeal(NP) swabs in vial transport medium  Result Value Ref Range Status   SARS Coronavirus 2 by RT PCR NEGATIVE NEGATIVE Final    Comment: (NOTE) SARS-CoV-2 target nucleic acids are NOT DETECTED.  The SARS-CoV-2 RNA is generally detectable in upper respiratory specimens during the acute phase of infection. The lowest concentration of SARS-CoV-2 viral copies this assay can detect is 138 copies/mL. A negative result does not preclude SARS-Cov-2 infection and should not be used as the sole basis for treatment or other patient management decisions. A negative result may occur with  improper specimen collection/handling, submission of specimen other than nasopharyngeal swab, presence of viral mutation(s) within the areas targeted by this assay, and inadequate number of viral copies(<138 copies/mL). A negative result must be combined with clinical observations, patient history, and epidemiological information. The expected result is Negative.  Fact Sheet for Patients:  EntrepreneurPulse.com.au  Fact Sheet for Healthcare Providers:  IncredibleEmployment.be  This test is no t yet approved or cleared by the [place] FDA and  has been authorized for detection and/or diagnosis of SARS-CoV-2 by FDA under an Emergency Use Authorization (EUA). This EUA will remain  in effect (meaning this test can be used) for the duration of the COVID-19 declaration under  Section 564(b)(1) of the Act, 21 U.S.C.section 360bbb-3(b)(1), unless the authorization is terminated  or revoked sooner.       Influenza A by PCR NEGATIVE NEGATIVE Final   Influenza B by PCR NEGATIVE NEGATIVE Final    Comment: (NOTE) The Xpert Xpress SARS-CoV-2/FLU/RSV plus assay is intended as an aid in the diagnosis of influenza from Nasopharyngeal swab specimens and should not be used as a sole basis for treatment. Nasal washings and aspirates are unacceptable for Xpert Xpress SARS-CoV-2/FLU/RSV testing.  Fact Sheet for Patients: EntrepreneurPulse.com.au  Fact Sheet for Healthcare Providers: IncredibleEmployment.be  This test is not yet approved or cleared by the [place] FDA and has been authorized for detection and/or diagnosis of SARS-CoV-2 by FDA under an Emergency Use Authorization (EUA). This EUA will remain in effect (meaning this test can be used) for the duration of the COVID-19 declaration under Section 564(b)(1) of the Act, 21 U.S.C. section 360bbb-3(b)(1), unless the authorization is terminated or  revoked.  Performed at [facility], [address], [place], [place] [address]      Labs: Basic Metabolic Panel: Recent Labs  Lab 04/03/21 1804 04/03/21 1921 04/04/21 0548  NA 137 137 133*  K 4.1 5.0 4.5  CL 102  --  101  CO2 25  --  22  GLUCOSE 114*  --  181*  BUN 23  --  23  CREATININE 0.82  --  0.98  CALCIUM 9.3  --  9.1   Liver Function Tests: Recent Labs  Lab 04/03/21 1804  AST 32  ALT 27  ALKPHOS 105  BILITOT 2.4*  PROT 6.4*  ALBUMIN 3.9   Recent Labs  Lab 04/03/21 1804  LIPASE <10*   No results for input(s): AMMONIA in the last 168 hours. CBC: Recent Labs  Lab 04/03/21 1804 04/03/21 1910 04/03/21 1921 04/04/21 0548  WBC 9.4  --   --  12.7*  NEUTROABS  --  7.8*  --   --   HGB 14.6  --  15.0 14.5  HCT 44.1  --  44.0 43.0  MCV 94.6  --   --  95.8  PLT 128*  --   --   125*   Cardiac Enzymes: No results for input(s): CKTOTAL, CKMB, CKMBINDEX, TROPONINI in the last 168 hours. BNP: BNP (last 3 results) No results for input(s): BNP in the last 8760 hours.  ProBNP (last 3 results) No results for input(s): PROBNP in the last 8760 hours.  CBG: Recent Labs  Lab 04/04/21 0100 04/04/21 1110  GLUCAP 161* 160*       Signed:  Nita Sells MD   [facility] 04/04/2021, 11:43 AM

## 2021-04-04 NOTE — Progress Notes (Signed)
Patient with recurrent appendicitis.  No appendicolith or other contra-indication to antibiotic treatment.  However, with recurrent appendicitis and need for immune suppression, may benefit from appendectomy at this point.  I have called the transfer center at [facility].  He has recently been evaluated by his transplant team, including a normal colonoscopy, after his recent bout of appendicitis for possible appendectomy.  They decided to hold off.  Now, recurrent appendicitis may sway their decision.  He would be best served being cared for by his transplant team and there is no emergent indication for appendectomy at this time.  Continue zosyn and will wait to hear back from the transfer center in [place].

## 2021-04-04 NOTE — H&P (Signed)
History and Physical    Robert Arnold NWG:[id number] DOB: [birth year] DOA: 04/03/2021  PCP: [name], MD Patient coming from: [facility]  Chief Complaint: Right lower quadrant abdominal pain  HPI: Robert Arnold is a 75 y.o. male with medical history significant of BPH, chronic diastolic CHF, IgA nephropathy s/p renal transplant in 2013 on immunosuppressants, insulin-dependent type 2 diabetes, gout, CAD status post CABG and PCI, OSA, paroxysmal A. fib on Coumadin, second-degree AV block Mobitz 1, left anterior fascicular block, right bundle branch block, history of appendicitis in August 2022 managed conservatively with antibiotics given concern about his transplanted kidney.  He presented to the ED today complaining of right lower quadrant abdominal pain.  Vital signs stable on arrival to the ED.  Labs showing no leukocytosis.  Platelet count 128k, chronically low and stable.  No elevation of lipase.  T bili mildly elevated at 2.4, transaminases and alk phos normal.  Creatinine 0.8, stable.  COVID and influenza PCR negative.  Lactic acid normal x2.  UA without signs of infection.  CT abdomen pelvis showing findings consistent with acute appendicitis. ED physician consulted Dr. Bobbye Morton with general surgery who recommended medical admission with general surgery consult for surgical evaluation of appendicitis.  Patient is on Coumadin for A. fib with INR 2.5 and was given vitamin K for reversal after discussion with general surgery.  Patient was given Zofran, Zosyn, 500 cc normal saline bolus, Dilaudid. Patient was not hypoxic on arrival to the ED.  After being given doses of Dilaudid, his oxygen saturation dropped to 86% on room air and was placed on 2 L supplemental oxygen.  VBG with pH 7.43, PCO2 40.  Chest x-ray showing no active disease.  Patient states he had appendicitis back in August which was managed with antibiotics due to his history of kidney transplant.  He started  having sudden onset, severe, 10 out of 10 intensity, dull right lower quadrant abdominal pain yesterday morning even before he ate breakfast.  He had 1 episode of vomiting in the emergency room.  Denies fevers or chills.  Denies cough, shortness of breath, or chest pain.  His abdominal pain has now improved after receiving pain medications.  No other complaints.  Review of Systems:  All systems reviewed and apart from history of presenting illness, are negative.  Past Medical History:  Diagnosis Date   Adenomatous colon polyp    Allergy    BPH (benign prostatic hyperplasia)    CHF (congestive heart failure) ([place])    Chronic kidney disease    due IgA nephropathy - s/p kidnet transplant 09/25/11   Diabetes mellitus type 2, controlled ([place])    FHx: heart disease 02/27/2018   Glaucoma    Gout    Histoplasmosis    on itraconazole for prophylaxis   MI (myocardial infarction) ([place]) 09/26/2011   NSTEMI (non-ST elevated myocardial infarction) ([place]) 11/26/2013   2013    OSA (obstructive sleep apnea)    PAF (paroxysmal atrial fibrillation) ([place])    s/p DC-CV in 6/13. Off coumadin due to ureteral bleed   Testosterone deficiency 02/27/2018    Past Surgical History:  Procedure Laterality Date   ANTERIOR CERVICAL DECOMP/DISCECTOMY FUSION N/A 05/04/2018   Procedure: Cervical Five-Six Anterior cervical decompression/discectomy/fusion;  Surgeon: Jovita Gamma, MD;  Location: [facility];  Service: Neurosurgery;  Laterality: N/A;  Cervical Five-Six Anterior cervical decompression/discectomy/fusion   AV FISTULA PLACEMENT  2011   Left forearm   COLONOSCOPY     CORONARY ANGIOPLASTY  WITH STENT PLACEMENT     CORONARY ARTERY BYPASS GRAFT  2013   KIDNEY TRANSPLANT  09/25/2011   PERITONEAL CATHETER INSERTION  2011     reports that he quit smoking about 30 years ago. His smoking use included cigarettes. He started smoking about 55 years ago. He has a 48.00 pack-year smoking history. He has never used smokeless  tobacco. He reports current alcohol use. He reports that he does not use drugs.  Allergies  Allergen Reactions   Losartan Anaphylaxis   Crestor [Rosuvastatin] Other (See Comments)    Elevated LFT's   Lorazepam     Pt is unsure of reaction    Morphine And Related Other (See Comments)    Has no effect on pt     Family History  Problem Relation Age of Onset   Diabetes Mother    COPD Mother    Heart attack Father        Unsure    Kidney disease Sister        no longer speak   Alcoholism Brother    Alzheimer's disease Brother    Alcohol abuse Brother    COPD Brother    Alcohol abuse Maternal Grandfather    Colon cancer Neg Hx    Stomach cancer Neg Hx    Rectal cancer Neg Hx    Esophageal cancer Neg Hx    Liver cancer Neg Hx    Pancreatic cancer Neg Hx     Prior to Admission medications   Medication Sig Start Date End Date Taking? Authorizing Provider  allopurinol (ZYLOPRIM) 300 MG tablet Take 1/2 tablet Daily  to Prevent  Gout Patient taking differently: Take 300 mg by mouth daily. 11/30/20   Liane Comber, NP  Ascorbic Acid (VITAMIN C) 1000 MG tablet Take 1,000 mg by mouth daily.    [provider]  aspirin EC 81 MG tablet Take 81 mg by mouth daily.    [provider]  atorvastatin (LIPITOR) 40 MG tablet Take  1 tablet  Daily  for Cholesterol                                                     /                   TAKE ONE TABLET BY MOUTH 04/02/21   [name], MD  B Complex-Biotin-FA (B COMPLETE) TABS Take 1 tablet by mouth daily.    [provider]  Black Pepper-Turmeric (TURMERIC COMPLEX/BLACK PEPPER PO) Take 1,500 mg by mouth daily.    [provider]  buPROPion (WELLBUTRIN XL) 300 MG 24 hr tablet Take  1 tablet  Daily for Mood, Focus & Concentration Patient taking differently: Take  1 tablet  Daily for Mood, Focus & Concentration 09/20/20   [name], MD  Calcium Carbonate-Vitamin D (OYSTER SHELL CALCIUM/D) 250-125 MG-UNIT  TABS Take 1 tablet by mouth daily.    [provider]  finasteride (PROSCAR) 5 MG tablet Take  1 tablet  Daily  for Prostate 12/28/20   [name], MD  gabapentin (NEURONTIN) 300 MG capsule TAKE ONE CAPSULE BY MOUTH THREE TIMES A DAY AS NEEDED FOR PAIN 12/14/20   [name], MD  glucose blood (FREESTYLE TEST STRIPS) test strip Check blood sugar 3 to 4 times daily for medication regulation. 12/04/15  [name], MD  Hyoscyamine Sulfate SL (LEVSIN/SL) 0.125 MG SUBL Place 0.125 mg under the tongue every 4 (four) hours as needed. For stomach cramping and diarrhea. 04/03/21   Liane Comber, NP  insulin NPH-regular Human (NOVOLIN 70/30) (70-30) 100 UNIT/ML injection Take 20-30 units 2 x day or as directed Patient taking differently: Inject 12-22 Units into the skin in the morning and at bedtime. 22 units in the AM, 12-15 units in the PM. 02/12/14   [name], MD  latanoprost (XALATAN) 0.005 % ophthalmic solution Place 1 drop into both eyes at bedtime.  04/11/16   [provider]  MAGNESIUM-OXIDE 400 (241.3 Mg) MG tablet Take 400 mg by mouth 2 (two) times daily.  12/29/15   [provider]  mycophenolate (MYFORTIC) 360 MG TBEC EC tablet TAKE 1 TABLET BY MOUTH TWICE DAILY 05/06/16   [name], MD  Probiotic Product (PROBIOTIC DAILY PO) Take 1 capsule by mouth daily.    [provider]  tacrolimus (PROGRAF) 0.5 MG capsule Take 0.5 mg by mouth 2 (two) times daily.    [provider]  tacrolimus (PROGRAF) 1 MG capsule Take 1 mg by mouth 2 (two) times daily.    [provider]  traZODone (DESYREL) 50 MG tablet Take  1 tablet  1 hour  before Bedtime  as needed for Sleep Patient taking differently: Take  1 tablet  1 hour  before Bedtime  as needed for Sleep 07/19/20   [name], MD  VITAMIN D PO Take 5,000-10,000 Units by mouth See admin instructions. Takes 10,000 unit capsules on MWF and 5,000 unit capsule 4 days a week.     [provider]  warfarin (COUMADIN) 5 MG tablet Take 1 to 2 tablets  /day as directed to Prevent Blood Clots                                   /         TAKE ONE TO TWO TABLETS BY MOUTH 01/29/21   [name], MD  Zinc 50 MG CAPS Take 50 mg by mouth daily.    [provider]    Physical Exam: Vitals:   04/03/21 2145 04/03/21 2232 04/03/21 2245 04/04/21 0028  BP: 137/69 (!) 131/52 (!) 111/59 (!) 134/120  Pulse: 76 74 78 81  Resp: _0 Temp:    99 F (37.2 C)  TempSrc:    Oral  SpO2: 99% 96% 93% 95%  Weight:        Physical Exam Constitutional:      General: He is not in acute distress. HENT:     Head: Normocephalic and atraumatic.  Eyes:     Extraocular Movements: Extraocular movements intact.     Conjunctiva/sclera: Conjunctivae normal.  Cardiovascular:     Rate and Rhythm: Normal rate and regular rhythm.     Pulses: Normal pulses.  Pulmonary:     Effort: Pulmonary effort is normal. No respiratory distress.     Breath sounds: Normal breath sounds. No wheezing or rales.  Abdominal:     General: Bowel sounds are normal. There is no distension.     Palpations: Abdomen is soft.     Tenderness: There is abdominal tenderness. There is no guarding or rebound.     Comments: Right lower quadrant tender to palpation  Musculoskeletal:        General: No swelling or tenderness.  Cervical back: Normal range of motion and neck supple.  Skin:    General: Skin is warm and dry.  Neurological:     General: No focal deficit present.     Mental Status: He is alert and oriented to person, place, and time.     Labs on Admission: I have personally reviewed following labs and imaging studies  CBC: Recent Labs  Lab 04/03/21 1804 04/03/21 1910 04/03/21 1921  WBC 9.4  --   --   NEUTROABS  --  7.8*  --   HGB 14.6  --  15.0  HCT 44.1  --  44.0  MCV 94.6  --   --   PLT 128*  --   --    Basic Metabolic Panel: Recent Labs  Lab 04/03/21 1804  04/03/21 1921  NA 137 137  K 4.1 5.0  CL 102  --   CO2 25  --   GLUCOSE 114*  --   BUN 23  --   CREATININE 0.82  --   CALCIUM 9.3  --    GFR: Estimated Creatinine Clearance: 84.4 mL/min (by C-G formula based on SCr of 0.82 mg/dL). Liver Function Tests: Recent Labs  Lab 04/03/21 1804  AST 32  ALT 27  ALKPHOS 105  BILITOT 2.4*  PROT 6.4*  ALBUMIN 3.9   Recent Labs  Lab 04/03/21 1804  LIPASE <10*   No results for input(s): AMMONIA in the last 168 hours. Coagulation Profile: Recent Labs  Lab 04/03/21 1910  INR 2.5*   Cardiac Enzymes: No results for input(s): CKTOTAL, CKMB, CKMBINDEX, TROPONINI in the last 168 hours. BNP (last 3 results) No results for input(s): PROBNP in the last 8760 hours. HbA1C: No results for input(s): HGBA1C in the last 72 hours. CBG: Recent Labs  Lab 04/04/21 0100  GLUCAP 161*   Lipid Profile: No results for input(s): CHOL, HDL, LDLCALC, TRIG, CHOLHDL, LDLDIRECT in the last 72 hours. Thyroid Function Tests: No results for input(s): TSH, T4TOTAL, FREET4, T3FREE, THYROIDAB in the last 72 hours. Anemia Panel: No results for input(s): VITAMINB12, FOLATE, FERRITIN, TIBC, IRON, RETICCTPCT in the last 72 hours. Urine analysis:    Component Value Date/Time   COLORURINE YELLOW 04/03/2021 2205   APPEARANCEUR CLEAR 04/03/2021 2205   LABSPEC >1.046 (H) 04/03/2021 2205   PHURINE 6.0 04/03/2021 2205   GLUCOSEU NEGATIVE 04/03/2021 2205   HGBUR NEGATIVE 04/03/2021 2205   BILIRUBINUR NEGATIVE 04/03/2021 2205   KETONESUR 40 (A) 04/03/2021 2205   PROTEINUR TRACE (A) 04/03/2021 2205   UROBILINOGEN 0.2 09/06/2013 1706   NITRITE NEGATIVE 04/03/2021 2205   LEUKOCYTESUR NEGATIVE 04/03/2021 2205    Radiological Exams on Admission: CT ABDOMEN PELVIS W CONTRAST  Result Date: 04/03/2021 CLINICAL DATA:  Right lower quadrant pain. EXAM: CT ABDOMEN AND PELVIS WITH CONTRAST TECHNIQUE: Multidetector CT imaging of the abdomen and pelvis was performed using  the standard protocol following bolus administration of intravenous contrast. CONTRAST:  155m OMNIPAQUE IOHEXOL 300 MG/ML  SOLN COMPARISON:  February 10, 2021 FINDINGS: Lower chest: Multiple sternal wires and sternal fixation plates and screws are seen. No acute abnormality. Hepatobiliary: There is diffuse fatty infiltration of the liver parenchyma. No focal liver abnormality is seen. Multiple tiny gallstones are seen within the lumen of an otherwise normal-appearing gallbladder. Pancreas: Unremarkable. No pancreatic ductal dilatation or surrounding inflammatory changes. Spleen: Numerous punctate calcified granulomas are seen scattered throughout the spleen. Adrenals/Urinary Tract: Adrenal glands are unremarkable. The native kidneys are atrophic in appearance with diffuse renal cortical thinning.  Multiple bilateral simple renal cysts of various sizes are seen. The largest measures approximately 4.0 cm x 3.8 cm and is seen within the lateral aspect of the mid right kidney. A renal transplant is seen within the pelvis on the right. Bladder is unremarkable. Stomach/Bowel: There is a very small hiatal hernia. The appendix is markedly thickened and inflamed. No evidence of bowel dilatation. A segment of decompressed small bowel is suspected along the anterior aspect of the cecum (axial CT images 43 through 50, CT series 2). Noninflamed diverticula are noted within the proximal to mid sigmoid colon. Vascular/Lymphatic: Aortic atherosclerosis. No enlarged abdominal or pelvic lymph nodes. Reproductive: Prostate is unremarkable. Other: No abdominal wall hernia or abnormality. No abdominopelvic ascites. Musculoskeletal: No acute or significant osseous findings. IMPRESSION: 1. Findings consistent with acute appendicitis. Inflammation of the adjacent portion of the cecum cannot be excluded. 2. Cholelithiasis. 3. Fatty liver. 4. Multiple bilateral simple renal cysts of various sizes. 5. Right pelvic renal transplant. 6. Sigmoid  diverticulosis. 7. Aortic atherosclerosis. Aortic Atherosclerosis (ICD10-I70.0). Electronically Signed   By: Virgina Norfolk M.D.   On: 04/03/2021 19:41   DG Chest Portable 1 View  Result Date: 04/03/2021 CLINICAL DATA:  Acute right lower quadrant abdominal pain. EXAM: PORTABLE CHEST 1 VIEW COMPARISON:  February 01, 2021. FINDINGS: Stable cardiomediastinal silhouette. Sternotomy wires are noted. Both lungs are clear. The visualized skeletal structures are unremarkable. IMPRESSION: No active disease. Electronically Signed   By: Marijo Conception M.D.   On: 04/03/2021 19:34    EKG: Ordered and currently pending.  Assessment/Plan Principal Problem:   Acute appendicitis Active Problems:   Gout   A-fib ([place])   Renal Transplant, s/p 09/2011   CAD (coronary artery disease) of artery bypass graft   Recurrent appendicitis Admitted back in August 2022 for acute appendicitis which was managed conservatively with antibiotics given concern about his transplanted kidney.  He returns with 1 day history of severe right lower quadrant abdominal pain associated with 1 episode of vomiting.  CT showing findings consistent with acute appendicitis.  No fever, tachycardia, leukocytosis, or lactic acidosis to suggest sepsis. -Gentle IV fluid hydration.  Continue Zosyn.  Continue Dilaudid as needed for severe pain.  He is on Coumadin for A. fib, hold at this time.  Keep n.p.o. I have notified general surgery (Dr. Bobbye Morton) that the patient has arrived [facility].  Transient hypoxia (resolved) After receiving IV opiates for pain.  Chest x-ray showing no active disease.  Currently satting well on room air.  Lungs clear on exam. -Continuous pulse ox, supplemental oxygen as needed to keep oxygen saturation above 92%  IgA nephropathy s/p renal transplant in 2013 Renal function stable. -Resume home immunosuppressants after pharmacy med rec is done.  Chronic diastolic CHF Echo done January 2020 showing EF 50 to  55% and grade 2 diastolic dysfunction.  No signs of volume overload at this time. -Receiving gentle IV fluids, monitor volume status closely  Paroxysmal A. fib Stable.  INR 2.5 and was given vitamin K in the ED for reversal after discussion with general surgery. -Hold Coumadin and monitor INR  CAD Not endorsing any anginal symptoms. -Pharmacy med rec pending.  BPH -Pharmacy med rec pending.  Insulin-dependent type 2 diabetes Well-controlled -recent A1c 6.1 on 03/20/2021. -Sliding scale insulin sensitive every 4 hours as patient is currently n.p.o.  Gout Stable. -Pharmacy med rec pending  DVT prophylaxis: SCDs Code Status: Patient wishes to be full code. Family Communication: No family available at this  time. Disposition Plan: Status is: Inpatient  Remains inpatient appropriate because: Recurrent appendicitis, needs IV antibiotics, IV fluids, and IV analgesics.  Level of care: Level of care: Med-Surg  The medical decision making on this patient was of high complexity and the patient is at high risk for clinical deterioration, therefore this is a level 3 visit.  Shela Leff MD [facility]  If 7PM-7AM, please contact night-coverage [email or web address]  04/04/2021, 2:38 AM

## 2021-04-05 LAB — GLUCOSE, CAPILLARY
Glucose-Capillary: 102 mg/dL — ABNORMAL HIGH (ref 70–99)
Glucose-Capillary: 111 mg/dL — ABNORMAL HIGH (ref 70–99)
Glucose-Capillary: 141 mg/dL — ABNORMAL HIGH (ref 70–99)
Glucose-Capillary: 167 mg/dL — ABNORMAL HIGH (ref 70–99)
Glucose-Capillary: 87 mg/dL (ref 70–99)
Glucose-Capillary: 99 mg/dL (ref 70–99)

## 2021-04-05 MED ORDER — BUPROPION HCL ER (XL) 150 MG PO TB24
300.0000 mg | ORAL_TABLET | Freq: Every day | ORAL | Status: DC
  Administered 2021-04-05 – 2021-04-10 (×5): 300 mg via ORAL
  Filled 2021-04-05 (×5): qty 2

## 2021-04-05 MED ORDER — SODIUM CHLORIDE 0.9 % IV SOLN: INTRAVENOUS | Status: DC

## 2021-04-05 MED ORDER — FINASTERIDE 5 MG PO TABS
5.0000 mg | ORAL_TABLET | Freq: Every day | ORAL | Status: DC
  Administered 2021-04-05 – 2021-04-10 (×5): 5 mg via ORAL
  Filled 2021-04-05 (×5): qty 1

## 2021-04-05 MED ORDER — INSULIN ASPART PROT & ASPART (70-30 MIX) 100 UNIT/ML ~~LOC~~ SUSP
22.0000 [IU] | Freq: Every day | SUBCUTANEOUS | Status: DC
  Administered 2021-04-05 – 2021-04-10 (×6): 22 [IU] via SUBCUTANEOUS

## 2021-04-05 MED ORDER — GABAPENTIN 300 MG PO CAPS
300.0000 mg | ORAL_CAPSULE | Freq: Every day | ORAL | Status: DC
  Administered 2021-04-05 – 2021-04-09 (×5): 300 mg via ORAL
  Filled 2021-04-05 (×5): qty 1

## 2021-04-05 MED ORDER — DIPHENHYDRAMINE HCL 25 MG PO CAPS: 25.0000 mg | ORAL_CAPSULE | Freq: Four times a day (QID) | ORAL | Status: DC | PRN

## 2021-04-05 MED ORDER — OXYCODONE HCL 5 MG PO TABS
5.0000 mg | ORAL_TABLET | ORAL | Status: DC | PRN
  Administered 2021-04-06 – 2021-04-10 (×7): 5 mg via ORAL
  Filled 2021-04-05 (×7): qty 1

## 2021-04-05 MED ORDER — INSULIN ASPART PROT & ASPART (70-30 MIX) 100 UNIT/ML ~~LOC~~ SUSP
12.0000 [IU] | Freq: Every day | SUBCUTANEOUS | Status: DC
  Administered 2021-04-05: 18:00:00 15 [IU] via SUBCUTANEOUS
  Administered 2021-04-06: 19:00:00 12 [IU] via SUBCUTANEOUS
  Administered 2021-04-07: 15 [IU] via SUBCUTANEOUS
  Administered 2021-04-08: 12 [IU] via SUBCUTANEOUS
  Filled 2021-04-05: qty 10

## 2021-04-05 NOTE — Progress Notes (Signed)
Patient did not discharge on 12/28 due to issues with [facility] transportation. There was a clerical error in their system and the patient transport was canceled and there were no available pick up times for 12/28 pm. Spoke to [place] a transfer specialist with [facility], who contacted bed management and stated pt will lose the bed that was available 12/28 but will be place on top priority list foe a bed this AM and once a bed becomes available transport will be dispatched. The patient is aware and wife was also notified

## 2021-04-05 NOTE — Progress Notes (Addendum)
Progress Note     Subjective: Having abdominal pain that is uncomfortable and making it difficult to sleep. Tolerating clears without nausea or emesis. No voiding complaints  Developed itching yesterday around lunchtime over left axilla and upper back. Also has bruising in that area as well as several other areas of bilateral back. He denies recent injury.  Objective: Vital signs in last 24 hours: Temp:  [97.9 F (36.6 C)-98.6 F (37 C)] 97.9 F (36.6 C) (12/29 0455) Pulse Rate:  [69-82] 82 (12/29 0455) Resp:  [16-18] 18 (12/29 0455) BP: (140-160)/(61-82) 160/75 (12/29 0455) SpO2:  [92 %-97 %] 97 % (12/29 0455)    Intake/Output from previous day: 12/28 0701 - 12/29 0700 In: 331.2 [P.O.:240; IV Piggyback:91.2] Out: -  Intake/Output this shift: No intake/output data recorded.  PE: General: pleasant, WD, male who is laying in bed in NAD HEENT: head is normocephalic, atraumatic. Mouth is pink and moist Heart: regular, rate, and rhythm.  Palpable radial pulses bilaterally Lungs: CTAB.  Respiratory effort nonlabored Abd: soft, ND, +BS, mild TTP over RLQ without rebound or guarding MSK: all 4 extremities are symmetrical with no cyanosis, clubbing, or edema. Calves without edema or TTP bilaterally Skin: warm and dry. Mild erythema over left axilla/upper back. Small discrete areas of ecchymosis of upper and lower bilateral back Psych: A&Ox3 with an appropriate affect.    Lab Results:  Recent Labs    04/03/21 1804 04/03/21 1921 04/04/21 0548  WBC 9.4  --  12.7*  HGB 14.6 15.0 14.5  HCT 44.1 44.0 43.0  PLT 128*  --  125*   BMET Recent Labs    04/03/21 1804 04/03/21 1921 04/04/21 0548  NA 137 137 133*  K 4.1 5.0 4.5  CL 102  --  101  CO2 25  --  22  GLUCOSE 114*  --  181*  BUN 23  --  23  CREATININE 0.82  --  0.98  CALCIUM 9.3  --  9.1   PT/INR Recent Labs    04/03/21 1910 04/04/21 0548  LABPROT 27.2* 19.5*  INR 2.5* 1.6*   CMP     Component Value  Date/Time   NA 133 (L) 04/04/2021 0548   NA 141 03/15/2013 0744   K 4.5 04/04/2021 0548   CL 101 04/04/2021 0548   CO2 22 04/04/2021 0548   GLUCOSE 181 (H) 04/04/2021 0548   BUN 23 04/04/2021 0548   BUN 16 03/15/2013 0744   CREATININE 0.98 04/04/2021 0548   CREATININE 0.89 03/20/2021 0905   CALCIUM 9.1 04/04/2021 0548   PROT 6.4 (L) 04/03/2021 1804   PROT 6.1 03/15/2013 0744   ALBUMIN 3.9 04/03/2021 1804   ALBUMIN 4.3 03/15/2013 0744   AST 32 04/03/2021 1804   ALT 27 04/03/2021 1804   ALKPHOS 105 04/03/2021 1804   BILITOT 2.4 (H) 04/03/2021 1804   GFRNONAA >60 04/04/2021 0548   GFRNONAA 65 05/16/2020 1505   GFRAA 76 05/16/2020 1505   Lipase     Component Value Date/Time   LIPASE <10 (L) 04/03/2021 1804       Studies/Results: CT ABDOMEN PELVIS W CONTRAST  Result Date: 04/03/2021 CLINICAL DATA:  Right lower quadrant pain. EXAM: CT ABDOMEN AND PELVIS WITH CONTRAST TECHNIQUE: Multidetector CT imaging of the abdomen and pelvis was performed using the standard protocol following bolus administration of intravenous contrast. CONTRAST:  144mL OMNIPAQUE IOHEXOL 300 MG/ML  SOLN COMPARISON:  February 10, 2021 FINDINGS: Lower chest: Multiple sternal wires and sternal fixation plates and  screws are seen. No acute abnormality. Hepatobiliary: There is diffuse fatty infiltration of the liver parenchyma. No focal liver abnormality is seen. Multiple tiny gallstones are seen within the lumen of an otherwise normal-appearing gallbladder. Pancreas: Unremarkable. No pancreatic ductal dilatation or surrounding inflammatory changes. Spleen: Numerous punctate calcified granulomas are seen scattered throughout the spleen. Adrenals/Urinary Tract: Adrenal glands are unremarkable. The native kidneys are atrophic in appearance with diffuse renal cortical thinning. Multiple bilateral simple renal cysts of various sizes are seen. The largest measures approximately 4.0 cm x 3.8 cm and is seen within the lateral  aspect of the mid right kidney. A renal transplant is seen within the pelvis on the right. Bladder is unremarkable. Stomach/Bowel: There is a very small hiatal hernia. The appendix is markedly thickened and inflamed. No evidence of bowel dilatation. A segment of decompressed small bowel is suspected along the anterior aspect of the cecum (axial CT images 43 through 50, CT series 2). Noninflamed diverticula are noted within the proximal to mid sigmoid colon. Vascular/Lymphatic: Aortic atherosclerosis. No enlarged abdominal or pelvic lymph nodes. Reproductive: Prostate is unremarkable. Other: No abdominal wall hernia or abnormality. No abdominopelvic ascites. Musculoskeletal: No acute or significant osseous findings. IMPRESSION: 1. Findings consistent with acute appendicitis. Inflammation of the adjacent portion of the cecum cannot be excluded. 2. Cholelithiasis. 3. Fatty liver. 4. Multiple bilateral simple renal cysts of various sizes. 5. Right pelvic renal transplant. 6. Sigmoid diverticulosis. 7. Aortic atherosclerosis. Aortic Atherosclerosis (ICD10-I70.0). Electronically Signed   By: Virgina Norfolk M.D.   On: 04/03/2021 19:41   DG Chest Portable 1 View  Result Date: 04/03/2021 CLINICAL DATA:  Acute right lower quadrant abdominal pain. EXAM: PORTABLE CHEST 1 VIEW COMPARISON:  February 01, 2021. FINDINGS: Stable cardiomediastinal silhouette. Sternotomy wires are noted. Both lungs are clear. The visualized skeletal structures are unremarkable. IMPRESSION: No active disease. Electronically Signed   By: Marijo Conception M.D.   On: 04/03/2021 19:34    Anti-infectives: Anti-infectives (From admission, onward)    Start     Dose/Rate Route Frequency Ordered Stop   04/04/21 0445  piperacillin-tazobactam (ZOSYN) IVPB 3.375 g       See Hyperspace for full Linked Orders Report.   3.375 g 12.5 mL/hr over 240 Minutes Intravenous Every 8 hours 04/03/21 2032     04/04/21 0000  piperacillin-tazobactam (ZOSYN) 3.375  GM/50ML IVPB        3.375 g Intravenous Every 8 hours 04/04/21 1143     04/03/21 2045  piperacillin-tazobactam (ZOSYN) IVPB 3.375 g       See Hyperspace for full Linked Orders Report.   3.375 g 100 mL/hr over 30 Minutes Intravenous  Once 04/03/21 2032 04/03/21 2118        Assessment/Plan  Recurrent appendicitis H/o renal transplant - discharge did not occur yesterday due to transport issues. Patient on list for transport to [facility] in [place] for surgical management - continue IV abx and await transport  - itchiness/rash - Allergies and MAR reviewed. Question if this could be related to dilaudid though he states morphine does not work for him. Will add prn oxycodone and benadryl  FEN: CLD ID: [id number] VTE: [id number]   LOS: 1 day   Winferd Humphrey, [facility] Surgery 04/05/2021, 7:44 AM Please see Amion for pager number during day hours 7:00am-4:30pm

## 2021-04-05 NOTE — Progress Notes (Signed)
Placed call to [facility], bed no longer available spoke with Ray. Hospital will call us back when bed is available.

## 2021-04-05 NOTE — Progress Notes (Signed)
Patient seen and examined  Complains of 6/10 pain but is relieved by Dilaudid-has some splotchy rash on his back-also little short of breath but sats are in the 90 percentile He remained stable for discharge whenever [facility] can accept him

## 2021-04-06 ENCOUNTER — Encounter (HOSPITAL_COMMUNITY): Payer: Self-pay | Admitting: Internal Medicine

## 2021-04-06 ENCOUNTER — Encounter (HOSPITAL_COMMUNITY)
Admission: EM | Disposition: A | Discharge status: Home / Self Care | Payer: Self-pay | Source: Home / Self Care | Attending: Internal Medicine

## 2021-04-06 ENCOUNTER — Inpatient Hospital Stay (HOSPITAL_COMMUNITY): Payer: Medicare Other | Admitting: Anesthesiology

## 2021-04-06 DIAGNOSIS — I482 Chronic atrial fibrillation, unspecified: Secondary | ICD-10-CM

## 2021-04-06 DIAGNOSIS — K3580 Unspecified acute appendicitis: Secondary | ICD-10-CM | POA: Diagnosis not present

## 2021-04-06 DIAGNOSIS — Z940 Kidney transplant status: Secondary | ICD-10-CM | POA: Diagnosis not present

## 2021-04-06 DIAGNOSIS — I25708 Atherosclerosis of coronary artery bypass graft(s), unspecified, with other forms of angina pectoris: Secondary | ICD-10-CM

## 2021-04-06 HISTORY — PX: LAPAROSCOPIC APPENDECTOMY: SHX408

## 2021-04-06 LAB — GLUCOSE, CAPILLARY
Glucose-Capillary: 107 mg/dL — ABNORMAL HIGH (ref 70–99)
Glucose-Capillary: 122 mg/dL — ABNORMAL HIGH (ref 70–99)
Glucose-Capillary: 96 mg/dL (ref 70–99)
Glucose-Capillary: 105 mg/dL — ABNORMAL HIGH (ref 70–99)
Glucose-Capillary: 99 mg/dL (ref 70–99)
Glucose-Capillary: 110 mg/dL — ABNORMAL HIGH (ref 70–99)

## 2021-04-06 LAB — PROTIME-INR
INR: 1.1 (ref 0.8–1.2)
Prothrombin Time: 13.9 seconds (ref 11.4–15.2)

## 2021-04-06 LAB — SURGICAL PCR SCREEN
MRSA, PCR: NEGATIVE
Staphylococcus aureus: POSITIVE — AB

## 2021-04-06 SURGERY — APPENDECTOMY, LAPAROSCOPIC
Anesthesia: General

## 2021-04-06 MED ORDER — OXYCODONE HCL 5 MG PO TABS: 5.0000 mg | ORAL_TABLET | Freq: Once | ORAL | Status: DC | PRN

## 2021-04-06 MED ORDER — ONDANSETRON HCL 4 MG/2ML IJ SOLN
INTRAMUSCULAR | Status: AC
  Filled 2021-04-06: qty 2

## 2021-04-06 MED ORDER — DEXAMETHASONE SODIUM PHOSPHATE 10 MG/ML IJ SOLN
INTRAMUSCULAR | Status: DC | PRN
  Administered 2021-04-06: 10 mg via INTRAVENOUS

## 2021-04-06 MED ORDER — CHLORHEXIDINE GLUCONATE 0.12 % MT SOLN: 15.0000 mL | Freq: Once | OROMUCOSAL | Status: AC

## 2021-04-06 MED ORDER — FENTANYL CITRATE (PF) 250 MCG/5ML IJ SOLN
INTRAMUSCULAR | Status: AC
  Filled 2021-04-06: qty 5

## 2021-04-06 MED ORDER — ONDANSETRON HCL 4 MG/2ML IJ SOLN
INTRAMUSCULAR | Status: DC | PRN
  Administered 2021-04-06: 4 mg via INTRAVENOUS

## 2021-04-06 MED ORDER — OXYCODONE HCL 5 MG/5ML PO SOLN: 5.0000 mg | Freq: Once | ORAL | Status: DC | PRN

## 2021-04-06 MED ORDER — PROPOFOL 10 MG/ML IV BOLUS
INTRAVENOUS | Status: DC | PRN
  Administered 2021-04-06: 130 mg via INTRAVENOUS

## 2021-04-06 MED ORDER — LATANOPROST 0.005 % OP SOLN
1.0000 [drp] | Freq: Every day | OPHTHALMIC | Status: DC
  Administered 2021-04-06 – 2021-04-09 (×5): 1 [drp] via OPHTHALMIC
  Filled 2021-04-06: qty 2.5

## 2021-04-06 MED ORDER — MUPIROCIN 2 % EX OINT
1.0000 "application " | TOPICAL_OINTMENT | Freq: Two times a day (BID) | CUTANEOUS | Status: DC
  Administered 2021-04-07 – 2021-04-10 (×7): 1 via NASAL
  Filled 2021-04-06: qty 22

## 2021-04-06 MED ORDER — MYCOPHENOLATE SODIUM 180 MG PO TBEC
360.0000 mg | DELAYED_RELEASE_TABLET | Freq: Two times a day (BID) | ORAL | Status: DC
  Administered 2021-04-06 – 2021-04-10 (×8): 360 mg via ORAL
  Filled 2021-04-06 (×9): qty 2

## 2021-04-06 MED ORDER — FENTANYL CITRATE (PF) 100 MCG/2ML IJ SOLN
INTRAMUSCULAR | Status: AC
  Filled 2021-04-06: qty 2

## 2021-04-06 MED ORDER — BUPIVACAINE-EPINEPHRINE (PF) 0.25% -1:200000 IJ SOLN
INTRAMUSCULAR | Status: AC
  Filled 2021-04-06: qty 30

## 2021-04-06 MED ORDER — FENTANYL CITRATE (PF) 250 MCG/5ML IJ SOLN
INTRAMUSCULAR | Status: DC | PRN
  Administered 2021-04-06: 50 ug via INTRAVENOUS
  Administered 2021-04-06 (×2): 100 ug via INTRAVENOUS

## 2021-04-06 MED ORDER — BUPIVACAINE-EPINEPHRINE 0.25% -1:200000 IJ SOLN
INTRAMUSCULAR | Status: DC | PRN
  Administered 2021-04-06: 7 mL

## 2021-04-06 MED ORDER — ROCURONIUM BROMIDE 10 MG/ML (PF) SYRINGE
PREFILLED_SYRINGE | INTRAVENOUS | Status: DC | PRN
  Administered 2021-04-06: 70 mg via INTRAVENOUS

## 2021-04-06 MED ORDER — CHLORHEXIDINE GLUCONATE 0.12 % MT SOLN
OROMUCOSAL | Status: AC
  Administered 2021-04-06: 15:00:00 15 mL via OROMUCOSAL
  Filled 2021-04-06: qty 15

## 2021-04-06 MED ORDER — OXYCODONE HCL 5 MG PO TABS
5.0000 mg | ORAL_TABLET | Freq: Four times a day (QID) | ORAL | Status: DC | PRN
  Administered 2021-04-06: 19:00:00 5 mg via ORAL
  Filled 2021-04-06 (×2): qty 1

## 2021-04-06 MED ORDER — ACETAMINOPHEN 10 MG/ML IV SOLN
INTRAVENOUS | Status: DC | PRN
  Administered 2021-04-06: 1000 mg via INTRAVENOUS

## 2021-04-06 MED ORDER — AMISULPRIDE (ANTIEMETIC) 5 MG/2ML IV SOLN: 10.0000 mg | Freq: Once | INTRAVENOUS | Status: DC | PRN

## 2021-04-06 MED ORDER — MYCOPHENOLATE MOFETIL HCL 500 MG IV SOLR
500.0000 mg | Freq: Two times a day (BID) | INTRAVENOUS | Status: DC
  Filled 2021-04-06 (×2): qty 15

## 2021-04-06 MED ORDER — LIDOCAINE 2% (20 MG/ML) 5 ML SYRINGE
INTRAMUSCULAR | Status: DC | PRN
  Administered 2021-04-06: 100 mg via INTRAVENOUS

## 2021-04-06 MED ORDER — 0.9 % SODIUM CHLORIDE (POUR BTL) OPTIME
TOPICAL | Status: DC | PRN
  Administered 2021-04-06: 15:00:00 1000 mL

## 2021-04-06 MED ORDER — LACTATED RINGERS IV SOLN: INTRAVENOUS | Status: DC

## 2021-04-06 MED ORDER — CHLORHEXIDINE GLUCONATE CLOTH 2 % EX PADS
6.0000 | MEDICATED_PAD | Freq: Every day | CUTANEOUS | Status: DC
  Administered 2021-04-07 – 2021-04-10 (×3): 6 via TOPICAL

## 2021-04-06 MED ORDER — SUGAMMADEX SODIUM 200 MG/2ML IV SOLN
INTRAVENOUS | Status: DC | PRN
  Administered 2021-04-06: 200 mg via INTRAVENOUS

## 2021-04-06 MED ORDER — ACETAMINOPHEN 10 MG/ML IV SOLN
INTRAVENOUS | Status: AC
  Filled 2021-04-06: qty 100

## 2021-04-06 MED ORDER — FENTANYL CITRATE (PF) 100 MCG/2ML IJ SOLN
25.0000 ug | INTRAMUSCULAR | Status: DC | PRN
  Administered 2021-04-06: 18:00:00 25 ug via INTRAVENOUS
  Administered 2021-04-06: 17:00:00 50 ug via INTRAVENOUS
  Administered 2021-04-06 (×3): 25 ug via INTRAVENOUS

## 2021-04-06 MED ORDER — ORAL CARE MOUTH RINSE: 15.0000 mL | Freq: Once | OROMUCOSAL | Status: AC

## 2021-04-06 MED ORDER — SODIUM CHLORIDE 0.9 % IR SOLN
Status: DC | PRN
  Administered 2021-04-06: 1000 mL

## 2021-04-06 MED ORDER — ONDANSETRON HCL 4 MG/2ML IJ SOLN: 4.0000 mg | Freq: Once | INTRAMUSCULAR | Status: DC | PRN

## 2021-04-06 MED ORDER — MIDAZOLAM HCL 2 MG/2ML IJ SOLN
INTRAMUSCULAR | Status: AC
  Filled 2021-04-06: qty 2

## 2021-04-06 SURGICAL SUPPLY — 42 items
APPLIER CLIP 5 13 M/L LIGAMAX5 (MISCELLANEOUS) ×2
BAG COUNTER SPONGE SURGICOUNT (BAG) ×2 IMPLANT
CANISTER SUCT 3000ML PPV (MISCELLANEOUS) ×2 IMPLANT
CHLORAPREP W/TINT 26 (MISCELLANEOUS) ×2 IMPLANT
CLIP APPLIE 5 13 M/L LIGAMAX5 (MISCELLANEOUS) IMPLANT
COVER SURGICAL LIGHT HANDLE (MISCELLANEOUS) ×2 IMPLANT
CUTTER FLEX LINEAR 45M (STAPLE) ×2 IMPLANT
DERMABOND ADVANCED (GAUZE/BANDAGES/DRESSINGS) ×1
DERMABOND ADVANCED .7 DNX12 (GAUZE/BANDAGES/DRESSINGS) ×1 IMPLANT
ELECT REM PT RETURN 9FT ADLT (ELECTROSURGICAL) ×2
ELECTRODE REM PT RTRN 9FT ADLT (ELECTROSURGICAL) ×1 IMPLANT
GLOVE SURG ENC MOIS LTX SZ7 (GLOVE) ×2 IMPLANT
GLOVE SURG UNDER POLY LF SZ7.5 (GLOVE) ×2 IMPLANT
GOWN STRL REUS W/ TWL LRG LVL3 (GOWN DISPOSABLE) ×3 IMPLANT
GOWN STRL REUS W/TWL LRG LVL3 (GOWN DISPOSABLE) ×6
GRASPER SUT TROCAR 14GX15 (MISCELLANEOUS) ×2 IMPLANT
KIT BASIN OR (CUSTOM PROCEDURE TRAY) ×2 IMPLANT
KIT TURNOVER KIT B (KITS) ×2 IMPLANT
NS IRRIG 1000ML POUR BTL (IV SOLUTION) ×2 IMPLANT
PAD ARMBOARD 7.5X6 YLW CONV (MISCELLANEOUS) ×4 IMPLANT
POUCH RETRIEVAL ECOSAC 10 (ENDOMECHANICALS) ×1 IMPLANT
POUCH RETRIEVAL ECOSAC 10MM (ENDOMECHANICALS) ×2
RELOAD 45 VASCULAR/THIN (ENDOMECHANICALS) IMPLANT
RELOAD STAPLE 45 2.5 WHT GRN (ENDOMECHANICALS) IMPLANT
RELOAD STAPLE 45 3.5 BLU ETS (ENDOMECHANICALS) IMPLANT
RELOAD STAPLE TA45 3.5 REG BLU (ENDOMECHANICALS) ×2 IMPLANT
SCISSORS LAP 5X35 DISP (ENDOMECHANICALS) IMPLANT
SET IRRIG TUBING LAPAROSCOPIC (IRRIGATION / IRRIGATOR) ×2 IMPLANT
SET TUBE SMOKE EVAC HIGH FLOW (TUBING) ×2 IMPLANT
SHEARS HARMONIC ACE PLUS 36CM (ENDOMECHANICALS) ×2 IMPLANT
SLEEVE ENDOPATH XCEL 5M (ENDOMECHANICALS) ×2 IMPLANT
SPECIMEN JAR SMALL (MISCELLANEOUS) ×2 IMPLANT
STRIP CLOSURE SKIN 1/2X4 (GAUZE/BANDAGES/DRESSINGS) ×2 IMPLANT
SUT MNCRL AB 4-0 PS2 18 (SUTURE) ×2 IMPLANT
SUT VICRYL 0 UR6 27IN ABS (SUTURE) ×2 IMPLANT
TOWEL GREEN STERILE (TOWEL DISPOSABLE) ×2 IMPLANT
TOWEL GREEN STERILE FF (TOWEL DISPOSABLE) ×2 IMPLANT
TRAY FOLEY MTR SLVR 16FR STAT (SET/KITS/TRAYS/PACK) IMPLANT
TRAY LAPAROSCOPIC MC (CUSTOM PROCEDURE TRAY) ×2 IMPLANT
TROCAR XCEL BLUNT TIP 100MML (ENDOMECHANICALS) ×2 IMPLANT
TROCAR XCEL NON-BLD 5MMX100MML (ENDOMECHANICALS) ×2 IMPLANT
WATER STERILE IRR 1000ML POUR (IV SOLUTION) ×2 IMPLANT

## 2021-04-06 NOTE — Assessment & Plan Note (Addendum)
Able to resume Prograf and CellCept post surgery

## 2021-04-06 NOTE — Progress Notes (Signed)
[facility] Progress Note  Patient: Robert Arnold    SEL:[id number]  DOA: 04/03/2021    Date of Service: the patient was seen and examined on 04/06/2021  Brief hospital course: 75 year old male with past medical history of IgA nephropathy s/p renal transplant on 2013 as well as BPH, CAD diabetes mellitus and obstructive sleep apnea who had an episode of appendicitis in August of this year and was managed conservatively with antibiotics.  Presented to the emergency room on evening of 12/27 with nausea and vomiting and found to have acute appendicitis again.  Seen by general surgery who recommended conservative management for now with plans to transfer to [facility], patient is followed for his transplant care.  Patient was accepted with plans to transfer later on 12/28 when unfortunately bed became unavailable.  Similar occurrence on 12/29 and patient unable to be transferred.  Patient seen by Dr. Serita Grammes and given the need for appendectomy and because of continued delay, courageously took him to the operative theater on 12/30 for appendectomy.  Assessment and Plan: Cardiovascular and Mediastinum A-fib [facility]) Assessment & Plan Seen prior to surgery, rate controlled  Digestive * Acute appendicitis Assessment & Plan Greatly appreciate surgery help.  Because of delays, unable to transfer and so patient taken for appendectomy here today.  Endocrine Diabetes mellitus type 2, controlled ([place]) Assessment & Plan Postop P.o., every 4 hours sliding scale.  Other Renal Transplant, s/p 09/2011 Assessment & Plan Once patient is p.o., resume medications.  In the meantime, while NPO, IV Prograf & cellcept.  We will check with pharmacy on one-to-one dosing    Body mass index is 27.98 kg/m.        Consultants: General surgery  Procedures: Appendectomy 12/30  Antimicrobials: IV Zosyn 12/28-present  Code Status: Full code   Subjective: Patient okay, seen before  surgery, some mild abdominal pain.  Objective: Vital signs were reviewed and unremarkable. Vitals:   04/06/21 0730 04/06/21 1425  BP: 119/61 140/68  Pulse: 89 88  Resp: 18 17  Temp: 98.7 F (37.1 C) 98.1 F (36.7 C)  SpO2: 100% 96%    Intake/Output Summary (Last 24 hours) at 04/06/2021 1548 Last data filed at 04/06/2021 0820 Gross per 24 hour  Intake 776.3 ml  Output 3 ml  Net 773.3 ml   Filed Weights   04/03/21 1756  Weight: 83.5 kg   Body mass index is 27.98 kg/m.  Exam:  General: Alert and oriented x3, no acute distress HEENT: Normocephalic and atraumatic, mucous membranes are moist Cardiovascular: Regular rate and rhythm, S1-S2 Respiratory: Clear to auscultation bilaterally Abdomen: Soft, generalized abdominal tenderness, hypoactive bowel sounds Musculoskeletal: No clubbing or cyanosis or edema  Data Reviewed: My review of labs, imaging, notes and other tests shows no new significant findings.   Disposition:  Status is: Inpatient  Remains inpatient appropriate because: Need for surgery    Family Communication: Left message for wife DVT Prophylaxis: SCDs Start: 04/04/21 0156    Author: Annita Brod ,MD 04/06/2021 3:48 PM  To reach On-call, see care teams to locate the attending and reach out via [email or web address]. Between 7PM-7AM, please contact night-coverage If you still have difficulty reaching the attending provider, please page the [facility] (Director on Call) for [facility] on amion for assistance.

## 2021-04-06 NOTE — Hospital Course (Addendum)
75 year old male with past medical history of IgA nephropathy s/p renal transplant on 2013 as well as BPH, CAD diabetes mellitus and obstructive sleep apnea who had an episode of appendicitis in August of this year and was managed conservatively with antibiotics.  Presented to the emergency room on evening of 12/27 with nausea and vomiting and found to have acute appendicitis again.  Seen by general surgery who recommended conservative management for now with plans to transfer to [facility], patient is followed for his transplant care.  Patient was accepted with plans to transfer later on 12/28 when unfortunately bed became unavailable.  General surgery took patient for appendectomy on 12/30 without incident.  Coumadin has been restarted, patient having some mild pain with full liquids.

## 2021-04-06 NOTE — Progress Notes (Signed)
Patient ID: Robert Arnold, male   DOB: [birth year], 75 y.o.   MRN: [id number] I have reviewed case, ct scan and discussed with Dr Ninfa Linden who saw him this am. I do think he needs appendectomy and a transfer does not appear to be going to occur.  He has history of PD last in 2013.   I am going to recheck his INR (was 1.6 a couple days ago) just to make sure ok but I recommended proceeding with lap appy today.  I discussed risks to include but not limited to bleeding, infection, open operation, injury to surrounding structures, injury to kidney.  I think not doing this and continuing abx, persistent infection has more risk to him as well as this needs to be sent for pathology.  He is agreeable and will proceed today here if his INR is fine.

## 2021-04-06 NOTE — Progress Notes (Signed)
Pt received back from PACU with 4 lap sites with dermabond and steristrips. Pt c/o pain, medicated with Dilaudid 1 mg IV slowly. Family at bedside, pt on full liquids.

## 2021-04-06 NOTE — Transfer of Care (Signed)
Immediate Anesthesia Transfer of Care Note  Patient: Robert Arnold  Procedure(s) Performed: APPENDECTOMY LAPAROSCOPIC  Patient Location: PACU  Anesthesia Type:General  Level of Consciousness: awake, alert  and oriented  Airway & Oxygen Therapy: Patient Spontanous Breathing  Post-op Assessment: Report given to RN and Post -op Vital signs reviewed and stable  Post vital signs: Reviewed and stable  Last Vitals:  Vitals Value Taken Time  BP 149/76 04/06/21 1715  Temp 36.5 C 04/06/21 1715  Pulse 82 04/06/21 1716  Resp 24 04/06/21 1716  SpO2 97 % 04/06/21 1716  Vitals shown include unvalidated device data.  Last Pain:  Vitals:   04/06/21 1425  TempSrc: Oral  PainSc: 3       Patients Stated Pain Goal: 0 (03/97/95 3692)  Complications: No notable events documented.

## 2021-04-06 NOTE — Assessment & Plan Note (Addendum)
Greatly appreciate surgery help.  Status post appendectomy.  Monitor for acute blood loss anemia.  Tolerated clears although with full liquids, had some mild pain.  Advance to soft diet.

## 2021-04-06 NOTE — Anesthesia Procedure Notes (Signed)
Procedure Name: Intubation Date/Time: 04/06/2021 3:56 PM Performed by: Babs Bertin, CRNA Pre-anesthesia Checklist: Patient identified, Emergency Drugs available, Suction available and Patient being monitored Patient Re-evaluated:Patient Re-evaluated prior to induction Oxygen Delivery Method: Circle System Utilized Preoxygenation: Pre-oxygenation with 100% oxygen Induction Type: IV induction Ventilation: Mask ventilation without difficulty Laryngoscope Size: Mac and 3 Grade View: Grade II Tube type: Oral Tube size: 7.5 mm Number of attempts: 1 Airway Equipment and Method: Stylet and Oral airway Placement Confirmation: ETT inserted through vocal cords under direct vision, positive ETCO2 and breath sounds checked- equal and bilateral Secured at: 22 cm Tube secured with: Tape Dental Injury: Teeth and Oropharynx as per pre-operative assessment

## 2021-04-06 NOTE — Op Note (Signed)
Preoperative diagnosis: Recurrent appendicitis Postoperative diagnosis: Recurrent perforated appendicitis Procedure: Laparoscopic appendectomy Surgeon: Dr. Serita Grammes Anesthesia: General Estimated blood loss: 30 cc Complications: None Drains: None Specimens: Appendix to pathology Sponge and count was correct completion Disposition to recovery stable condition  Indications: This is 75 year old male who had appendicitis managed nonoperatively in August 2022.  He has a history of peritoneal dialysis, kidney transplant in 2013 as well as atrial fibrillation and coronary artery disease.  He was admitted and seen on 28 December.  The plan initially was to try to transfer him to his transplant surgeon.  He had a CT that showed recurrent inflamed appendicitis.  His white count last done was elevated.  He has some minimal tenderness.  I discussed with him due to the fact that this is being delayed and he just remains here on antibiotics I thought he is.  Proceed to the operating room here.  We discussed laparoscopic appendectomy.  I discussed this [facility] and they agreed to this management as well.  Procedure: After informed consent was obtained the patient was taken the operative room.  He was already on antibiotics.  SCDs were in place.  He was placed under general anesthesia without complication.  He was prepped and draped in the standard sterile surgical fashion.  Surgical timeout was then performed.  Infiltrated Marcaine below his umbilicus.  Made a vertical incision.  I entered his fascia sharply and entered the peritoneum bluntly.  This was done without injury.  I placed a 0 Vicryl pursestring suture through the fascia.  I then inserted a Hassan trocar and insufflated the abdomen to 15 mmHg pressure.  He I inserted 2 additional 5 mm trochars in the lower abdomen under direct vision without complication.  I also placed a right upper quadrant trocar under direct vision  without complication as well.  His kidney and his iliac fossa was visible underneath the peritoneum.  This had pushed his cecum superiorly.  Due to his peritoneal dialysis he had a lot of very thickened peritoneum making this difficult.  I was able to identify the small bowel and traced this to the cecum.  I remove the omentum from the right lower quadrant.  Eventually I was able to identify the appendix.  This had acute appendicitis and was perforated with a small amount of purulence present.  There was also a small abscess present.  With some difficulty I eventually was able to trace the appendix to its base.  I divided the appendiceal mesentery with the harmonic scalpel after I used sharp dissection to dissect the peritoneal surface from his prior peritoneal dialysis.  I was then able to use the GIA stapler to come across the base.  I placed this in a retrieval bag and removed it from the abdomen.  I looked at the base and this did appear that I had remove the entire appendix I was I was able to see the cecum and the small bowel entering it right next to the staple line.  There was hemostasis.  I then placed the omentum overlying this.  I removed the [facility] trocar and tied my pursestring down.  I placed an additional couple 0 Vicryl sutures to the umbilicus.  I then remove the remaining trochars and desufflated the abdomen.  These were then closed with 4-0 Monocryl and glue.  He tolerated this well was extubated and transferred to recovery stable.

## 2021-04-06 NOTE — Progress Notes (Signed)
° °  Subjective/Chief Complaint: Patient still having some RLQ tenderness. Tolerating liquids Has not been transferred to [facility] secondary to bed situation.   Objective: Vital signs in last 24 hours: Temp:  [97.4 F (36.3 C)-99.2 F (37.3 C)] 98.7 F (37.1 C) (12/30 0730) Pulse Rate:  [84-89] 89 (12/30 0730) Resp:  [17-19] 18 (12/30 0730) BP: (119-133)/(61-67) 119/61 (12/30 0730) SpO2:  [98 %-100 %] 100 % (12/30 0730)    Intake/Output from previous day: 12/29 0701 - 12/30 0700 In: 776.3 [P.O.:600; I.V.:126.3; IV Piggyback:50] Out: 3 [Urine:3] Intake/Output this shift: Total I/O In: 240 [P.O.:240] Out: -   Exam: Awake and alert Abdomen soft, almost non-tender, no frank peritonitis  Lab Results:  Recent Labs    04/03/21 1804 04/03/21 1921 04/04/21 0548  WBC 9.4  --  12.7*  HGB 14.6 15.0 14.5  HCT 44.1 44.0 43.0  PLT 128*  --  125*   BMET Recent Labs    04/03/21 1804 04/03/21 1921 04/04/21 0548  NA 137 137 133*  K 4.1 5.0 4.5  CL 102  --  101  CO2 25  --  22  GLUCOSE 114*  --  181*  BUN 23  --  23  CREATININE 0.82  --  0.98  CALCIUM 9.3  --  9.1   PT/INR Recent Labs    04/03/21 1910 04/04/21 0548  LABPROT 27.2* 19.5*  INR 2.5* 1.6*   ABG Recent Labs    04/03/21 1921  HCO3 27.6    Studies/Results: No results found.  Anti-infectives: Anti-infectives (From admission, onward)    Start     Dose/Rate Route Frequency Ordered Stop   04/04/21 0445  piperacillin-tazobactam (ZOSYN) IVPB 3.375 g       See Hyperspace for full Linked Orders Report.   3.375 g 12.5 mL/hr over 240 Minutes Intravenous Every 8 hours 04/03/21 2032     04/04/21 0000  piperacillin-tazobactam (ZOSYN) 3.375 GM/50ML IVPB        3.375 g Intravenous Every 8 hours 04/04/21 1143     04/03/21 2045  piperacillin-tazobactam (ZOSYN) IVPB 3.375 g       See Hyperspace for full Linked Orders Report.   3.375 g 100 mL/hr over 30 Minutes Intravenous  Once 04/03/21 2032 04/03/21 2118        Assessment/Plan: Recurrent appendicitis H/o renal transplant  Currently no bed is available at [facility] for transfer. Given uncertainty of the ability to transfer the patient, we will have to consider proceeding with an appendectomy here.  I will discuss this with our primary surgeon on call today Will make NPO for now pending a decision.    Coralie Keens MD 04/06/2021

## 2021-04-06 NOTE — Assessment & Plan Note (Addendum)
Insulin 70/30 resumed.  Still some episodes of mild hypoglycemia, so have added sliding scale.

## 2021-04-06 NOTE — Anesthesia Preprocedure Evaluation (Addendum)
Anesthesia Evaluation  Patient identified by MRN, date of birth, ID band Patient awake    Reviewed: Allergy & Precautions, NPO status , Patient's Chart, lab work & pertinent test results  History of Anesthesia Complications Negative for: history of anesthetic complications  Airway Mallampati: II  TM Distance: >3 FB Neck ROM: Full    Dental  (+) Dental Advisory Given   Pulmonary sleep apnea , former smoker,    Pulmonary exam normal        Cardiovascular hypertension, + CAD, + Past MI (2013), + Cardiac Stents, + CABG (2013) and +CHF  Normal cardiovascular exam+ dysrhythmias Atrial Fibrillation    Echo 04/17/18: EF 50-55%, no RWMA, g2dd, mild AS (MG 11, AVA 1.07), severe LAE, nl RVSF, PASP 27   Neuro/Psych Depression    GI/Hepatic negative GI ROS, Neg liver ROS,   Endo/Other  diabetes, Type 2, Insulin Dependent  Renal/GU Renal disease (s/p renal txp 2013)  negative genitourinary   Musculoskeletal  (+) Arthritis ,   Abdominal   Peds  Hematology Warfarin   Anesthesia Other Findings   Reproductive/Obstetrics                            Anesthesia Physical Anesthesia Plan  ASA: 3 and emergent  Anesthesia Plan: General   Post-op Pain Management: Ofirmev IV (intra-op)   Induction: Intravenous  PONV Risk Score and Plan: 2 and Ondansetron, Dexamethasone, Treatment may vary due to age or medical condition and Midazolam  Airway Management Planned: Oral ETT  Additional Equipment: None  Intra-op Plan:   Post-operative Plan: Extubation in OR  Informed Consent: I have reviewed the patients History and Physical, chart, labs and discussed the procedure including the risks, benefits and alternatives for the proposed anesthesia with the patient or authorized representative who has indicated his/her understanding and acceptance.     Dental advisory given  Plan Discussed with:   Anesthesia Plan  Comments:        Anesthesia Quick Evaluation

## 2021-04-06 NOTE — Assessment & Plan Note (Addendum)
Rate controlled.  Okay by surgery to resume Coumadin.

## 2021-04-07 DIAGNOSIS — I482 Chronic atrial fibrillation, unspecified: Secondary | ICD-10-CM | POA: Diagnosis not present

## 2021-04-07 DIAGNOSIS — Z940 Kidney transplant status: Secondary | ICD-10-CM | POA: Diagnosis not present

## 2021-04-07 DIAGNOSIS — K3580 Unspecified acute appendicitis: Secondary | ICD-10-CM | POA: Diagnosis not present

## 2021-04-07 DIAGNOSIS — I25708 Atherosclerosis of coronary artery bypass graft(s), unspecified, with other forms of angina pectoris: Secondary | ICD-10-CM | POA: Diagnosis not present

## 2021-04-07 LAB — CBC
HCT: 41.8 % (ref 39.0–52.0)
Hemoglobin: 14 g/dL (ref 13.0–17.0)
MCH: 31.7 pg (ref 26.0–34.0)
MCHC: 33.5 g/dL (ref 30.0–36.0)
MCV: 94.6 fL (ref 80.0–100.0)
Platelets: 140 10*3/uL — ABNORMAL LOW (ref 150–400)
RBC: 4.42 MIL/uL (ref 4.22–5.81)
RDW: 12.2 % (ref 11.5–15.5)
WBC: 7.6 10*3/uL (ref 4.0–10.5)
nRBC: 0 % (ref 0.0–0.2)

## 2021-04-07 LAB — GLUCOSE, CAPILLARY
Glucose-Capillary: 129 mg/dL — ABNORMAL HIGH (ref 70–99)
Glucose-Capillary: 175 mg/dL — ABNORMAL HIGH (ref 70–99)
Glucose-Capillary: 212 mg/dL — ABNORMAL HIGH (ref 70–99)
Glucose-Capillary: 249 mg/dL — ABNORMAL HIGH (ref 70–99)
Glucose-Capillary: 268 mg/dL — ABNORMAL HIGH (ref 70–99)
Glucose-Capillary: 286 mg/dL — ABNORMAL HIGH (ref 70–99)

## 2021-04-07 MED ORDER — WARFARIN - PHARMACIST DOSING INPATIENT: Freq: Every day | Status: DC

## 2021-04-07 MED ORDER — WARFARIN SODIUM 5 MG PO TABS
5.0000 mg | ORAL_TABLET | Freq: Once | ORAL | Status: AC
  Administered 2021-04-08: 5 mg via ORAL
  Filled 2021-04-07 (×2): qty 1

## 2021-04-07 NOTE — Progress Notes (Signed)
ANTICOAGULATION CONSULT NOTE - Initial Consult  Pharmacy Consult for Lovenox to Warfarin [place] Indication: atrial fibrillation  Allergies  Allergen Reactions   Losartan Anaphylaxis   Crestor [Rosuvastatin] Other (See Comments)    Elevated LFT's   Lorazepam     Pt is unsure of reaction    Morphine And Related Other (See Comments)    Has no effect on pt     Patient Measurements: Height: 5\' 8"  (172.7 cm) Weight: 83.5 kg (184 lb) IBW/kg (Calculated) : 68.4   Vital Signs: Temp: 97.4 F (36.3 C) (12/31 1210) Temp Source: Oral (12/31 1210) BP: 133/76 (12/31 1210) Pulse Rate: 98 (12/31 1210)  Labs: Recent Labs    04/06/21 1146 04/07/21 0127  HGB  --  14.0  HCT  --  41.8  PLT  --  140*  LABPROT 13.9  --   INR 1.1  --     Estimated Creatinine Clearance: 70.6 mL/min (by C-G formula based on SCr of 0.98 mg/dL).   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 Off coumadin due to ureteral bleed   Testosterone deficiency 02/27/2018    Medications:  Scheduled:   buPROPion  300 mg Oral Daily   Chlorhexidine Gluconate Cloth  6 each Topical Q0600   enoxaparin (LOVENOX) injection  80 mg Subcutaneous Q12H   finasteride  5 mg Oral Daily   gabapentin  300 mg Oral QHS   insulin aspart protamine- aspart  12-15 Units Subcutaneous Q supper   insulin aspart protamine- aspart  22 Units Subcutaneous Q  breakfast   latanoprost  1 drop Both Eyes QHS   mupirocin ointment  1 application Nasal BID   mycophenolate  360 mg Oral BID   tacrolimus  1.5 mg Oral BID    Assessment: 75 year old male presenting with right lower quadrant abdominal pain. He has a history of Atrial Fibrillation, on warfarin. Now s/p laparoscopic appendectomy and surgery gave the okay to resume warfarin today. PTA dose is 5mg  daily. INR was therapeutic on admission at 2.5. Will resume warfarin at home dose.   Goal of Therapy:  INR 2-3 Monitor platelets by anticoagulation protocol: Yes   Plan:  Start coumadin 5 mg PO x1 dose @ 16:00 Monitor daily CBC, INR, and s/sx of bleeding  Lestine Box, PharmD PGY2 Infectious Diseases Pharmacy Resident   Please check [email or web address] for unit-specific pharmacy phone numbers

## 2021-04-07 NOTE — Progress Notes (Signed)
Mobility Specialist Progress Note:   04/07/21 1000  Mobility  Activity Ambulated in hall  Level of Amber Other (Comment) (IV Pole)  Distance Ambulated (ft) 300 ft  Mobility Ambulated independently in hallway  Mobility Response Tolerated well  Mobility performed by Mobility specialist  Bed Position Chair  $Mobility charge 1 Mobility   Pt with increased incisional pain during ambulation, 5/10 per pt. Otherwise asx, pt back in chair with PA present.   Nelta Numbers Mobility Specialist  Phone [phone]

## 2021-04-07 NOTE — Progress Notes (Addendum)
Progress Note  1 Day Post-Op  Subjective: Having post op abdominal soreness. Ambulating well. No nausea or emesis on clear liquids. No voiding complaints  Objective: Vital signs in last 24 hours: Temp:  [97.5 F (36.4 C)-98.1 F (36.7 C)] 97.8 F (36.6 C) (12/31 0813) Pulse Rate:  [84-88] 87 (12/31 0813) Resp:  [16-21] 18 (12/31 0813) BP: (128-162)/(66-81) 128/78 (12/31 0813) SpO2:  [92 %-100 %] 96 % (12/31 0813)    Intake/Output from previous day: 12/30 0701 - 12/31 0700 In: 1280 [P.O.:480; I.V.:800] Out: 20 [Blood:20] Intake/Output this shift: No intake/output data recorded.  PE: General: pleasant, WD, male who is sitting up in chair in NAD HEENT: head is normocephalic, atraumatic. Mouth is pink and moist Heart: regular, rate, and rhythm.  Palpable radial pulses bilaterally Lungs: CTAB.  Respiratory effort nonlabored Abd: soft, ND, +BS, mild TTP greatest over incisions which are with steri strips intact - no erythema or induration.  MSK: all 4 extremities are symmetrical with no cyanosis, clubbing, or edema. Calves without edema or TTP bilaterally Skin: warm and dry Psych: A&Ox3 with an appropriate affect.    Lab Results:  Recent Labs    04/07/21 0127  WBC 7.6  HGB 14.0  HCT 41.8  PLT 140*    BMET No results for input(s): NA, K, CL, CO2, GLUCOSE, BUN, CREATININE, CALCIUM in the last 72 hours.  PT/INR Recent Labs    04/06/21 1146  LABPROT 13.9  INR 1.1    CMP     Component Value Date/Time   NA 133 (L) 04/04/2021 0548   NA 141 03/15/2013 0744   K 4.5 04/04/2021 0548   CL 101 04/04/2021 0548   CO2 22 04/04/2021 0548   GLUCOSE 181 (H) 04/04/2021 0548   BUN 23 04/04/2021 0548   BUN 16 03/15/2013 0744   CREATININE 0.98 04/04/2021 0548   CREATININE 0.89 03/20/2021 0905   CALCIUM 9.1 04/04/2021 0548   PROT 6.4 (L) 04/03/2021 1804   PROT 6.1 03/15/2013 0744   ALBUMIN 3.9 04/03/2021 1804   ALBUMIN 4.3 03/15/2013 0744   AST 32 04/03/2021 1804    ALT 27 04/03/2021 1804   ALKPHOS 105 04/03/2021 1804   BILITOT 2.4 (H) 04/03/2021 1804   GFRNONAA >60 04/04/2021 0548   GFRNONAA 65 05/16/2020 1505   GFRAA 76 05/16/2020 1505   Lipase     Component Value Date/Time   LIPASE <10 (L) 04/03/2021 1804       Studies/Results: No results found.  Anti-infectives: Anti-infectives (From admission, onward)    Start     Dose/Rate Route Frequency Ordered Stop   04/04/21 0445  piperacillin-tazobactam (ZOSYN) IVPB 3.375 g       See Hyperspace for full Linked Orders Report.   3.375 g 12.5 mL/hr over 240 Minutes Intravenous Every 8 hours 04/03/21 2032     04/04/21 0000  piperacillin-tazobactam (ZOSYN) 3.375 GM/50ML IVPB        3.375 g Intravenous Every 8 hours 04/04/21 1143     04/03/21 2045  piperacillin-tazobactam (ZOSYN) IVPB 3.375 g       See Hyperspace for full Linked Orders Report.   3.375 g 100 mL/hr over 30 Minutes Intravenous  Once 04/03/21 2032 04/03/21 2118        Assessment/Plan  Recurrent appendicitis H/o renal transplant - POD 1 s/p laparoscopic appendectomy 12/30 Dr. Donne Hazel with findings of perforated appendicitis and abscess - continue abx 5 days post op, can transition to PO on discharge - advance diet today -  continue IS use and ambulation - can resume coumadin today  FEN: FLD ID: [email or web address] VTE: lovenox, can resume coumadin today from our standpoint   LOS: 3 days   Winferd Humphrey, [facility] Surgery 04/07/2021, 9:21 AM Please see Amion for pager number during day hours 7:00am-4:30pm

## 2021-04-07 NOTE — Progress Notes (Signed)
[facility] Progress Note  Patient: Robert Arnold    OMV:[id number]  DOA: 04/03/2021    Date of Service: the patient was seen and examined on 04/07/2021  Brief hospital course: 75 year old male with past medical history of IgA nephropathy s/p renal transplant on 2013 as well as BPH, CAD diabetes mellitus and obstructive sleep apnea who had an episode of appendicitis in August of this year and was managed conservatively with antibiotics.  Presented to the emergency room on evening of 12/27 with nausea and vomiting and found to have acute appendicitis again.  Seen by general surgery who recommended conservative management for now with plans to transfer to [facility], patient is followed for his transplant care.  Patient was accepted with plans to transfer later on 12/28 when unfortunately bed became unavailable.  General surgery took patient for appendectomy on 12/30 without incident.  Assessment and Plan: Cardiovascular and Mediastinum A-fib ([place]) Assessment & Plan Rate controlled.  Okay by surgery to resume Coumadin.  Digestive * Acute appendicitis Assessment & Plan Greatly appreciate surgery help.  Status post appendectomy.  Monitor for acute blood loss anemia.  Tolerating clears.  Endocrine Diabetes mellitus type 2, controlled ([place]) Assessment & Plan Insulin 70/30 resumed  Other Renal Transplant, s/p 09/2011 Assessment & Plan Able to resume Prograf and CellCept post surgery    Body mass index is 27.98 kg/m.        Consultants: General surgery  Procedures: Appendectomy 12/30  Antimicrobials: IV Zosyn 12/28-present  Code Status: Full code   Subjective: Patient doing well, minimal pain.  Tolerating full liquids  Objective: Vital signs were reviewed and unremarkable. Vitals:   04/07/21 0813 04/07/21 1210  BP: 128/78 133/76  Pulse: 87 98  Resp: 18 19  Temp: 97.8 F (36.6 C) (!) 97.4 F (36.3 C)  SpO2: 96% 97%    Intake/Output Summary (Last  24 hours) at 04/07/2021 1514 Last data filed at 04/07/2021 0437 Gross per 24 hour  Intake 1040 ml  Output 20 ml  Net 1020 ml   Filed Weights   04/03/21 1756  Weight: 83.5 kg   Body mass index is 27.98 kg/m.  Exam:  General: Alert and oriented x3, no acute distress HEENT: Normocephalic and atraumatic, mucous membranes are moist Cardiovascular: Irregular rhythm, rate controlled Respiratory: Clear to auscultation bilaterally Abdomen: Soft, tender in the right lower quadrant, nondistended, hypoactive bowel sounds Musculoskeletal: No clubbing or cyanosis or edema Psychiatry: Appropriate, no evidence of psychoses Neurology: No focal deficits  Data Reviewed: My review of labs, imaging, notes and other tests shows no new significant findings.   Disposition:  Status is: Inpatient  Remains inpatient appropriate because: Postop clearance  Family Communication: Declined for me to call family DVT Prophylaxis: SCDs Start: 04/04/21 0156    Author: Annita Brod ,MD 04/07/2021 3:14 PM  To reach On-call, see care teams to locate the attending and reach out via [email or web address]. Between 7PM-7AM, please contact night-coverage If you still have difficulty reaching the attending provider, please page the [facility] (Director on Call) for [facility] on amion for assistance.

## 2021-04-08 ENCOUNTER — Encounter (HOSPITAL_COMMUNITY): Payer: Self-pay | Admitting: General Surgery

## 2021-04-08 DIAGNOSIS — Z940 Kidney transplant status: Secondary | ICD-10-CM | POA: Diagnosis not present

## 2021-04-08 DIAGNOSIS — I482 Chronic atrial fibrillation, unspecified: Secondary | ICD-10-CM | POA: Diagnosis not present

## 2021-04-08 DIAGNOSIS — K3580 Unspecified acute appendicitis: Secondary | ICD-10-CM | POA: Diagnosis not present

## 2021-04-08 DIAGNOSIS — I25708 Atherosclerosis of coronary artery bypass graft(s), unspecified, with other forms of angina pectoris: Secondary | ICD-10-CM | POA: Diagnosis not present

## 2021-04-08 LAB — CBC
HCT: 38.3 % — ABNORMAL LOW (ref 39.0–52.0)
Hemoglobin: 13.3 g/dL (ref 13.0–17.0)
MCH: 31.8 pg (ref 26.0–34.0)
MCHC: 34.7 g/dL (ref 30.0–36.0)
MCV: 91.6 fL (ref 80.0–100.0)
Platelets: 168 10*3/uL (ref 150–400)
RBC: 4.18 MIL/uL — ABNORMAL LOW (ref 4.22–5.81)
RDW: 12.4 % (ref 11.5–15.5)
WBC: 10.4 10*3/uL (ref 4.0–10.5)
nRBC: 0 % (ref 0.0–0.2)

## 2021-04-08 LAB — GLUCOSE, CAPILLARY
Glucose-Capillary: 170 mg/dL — ABNORMAL HIGH (ref 70–99)
Glucose-Capillary: 194 mg/dL — ABNORMAL HIGH (ref 70–99)
Glucose-Capillary: 199 mg/dL — ABNORMAL HIGH (ref 70–99)
Glucose-Capillary: 238 mg/dL — ABNORMAL HIGH (ref 70–99)
Glucose-Capillary: 254 mg/dL — ABNORMAL HIGH (ref 70–99)
Glucose-Capillary: 278 mg/dL — ABNORMAL HIGH (ref 70–99)

## 2021-04-08 LAB — BASIC METABOLIC PANEL
Anion gap: 7 (ref 5–15)
BUN: 38 mg/dL — ABNORMAL HIGH (ref 8–23)
CO2: 27 mmol/L (ref 22–32)
Calcium: 9.8 mg/dL (ref 8.9–10.3)
Chloride: 98 mmol/L (ref 98–111)
Creatinine, Ser: 1 mg/dL (ref 0.61–1.24)
GFR, Estimated: >60 mL/min (ref >60)
Glucose, Bld: 274 mg/dL — ABNORMAL HIGH (ref 70–99)
Potassium: 3.9 mmol/L (ref 3.5–5.1)
Sodium: 132 mmol/L — ABNORMAL LOW (ref 135–145)

## 2021-04-08 LAB — PROTIME-INR
INR: 1.2 (ref 0.8–1.2)
Prothrombin Time: 15.4 seconds — ABNORMAL HIGH (ref 11.4–15.2)

## 2021-04-08 MED ORDER — INSULIN ASPART 100 UNIT/ML IJ SOLN
0.0000 [IU] | Freq: Every day | INTRAMUSCULAR | Status: DC
  Administered 2021-04-09: 2 [IU] via SUBCUTANEOUS

## 2021-04-08 MED ORDER — INSULIN ASPART 100 UNIT/ML IJ SOLN
0.0000 [IU] | Freq: Three times a day (TID) | INTRAMUSCULAR | Status: DC
  Administered 2021-04-08 – 2021-04-10 (×5): 2 [IU] via SUBCUTANEOUS

## 2021-04-08 NOTE — Discharge Instructions (Signed)
[facility] ______[facility] LAPAROSCOPIC SURGERY: POST OP INSTRUCTIONS Always review your discharge instruction sheet given to you by the facility where your surgery was performed. IF YOU HAVE DISABILITY OR FAMILY LEAVE FORMS, YOU MUST BRING THEM TO THE OFFICE FOR PROCESSING.   DO NOT GIVE THEM TO YOUR DOCTOR.  A prescription for pain medication may be given to you upon discharge.  Take your pain medication as prescribed, if needed.  If narcotic pain medicine is not needed, then you may take acetaminophen (Tylenol) or ibuprofen (Advil) as needed. Take your usually prescribed medications unless otherwise directed. If you need a refill on your pain medication, please contact your pharmacy.  They will contact our office to request authorization. Prescriptions will not be filled after 5pm or on week-ends. You should follow a light diet the first few days after arrival home, such as soup and crackers, etc.  Be sure to include lots of fluids daily. Most patients will experience some swelling and bruising in the area of the incisions.  Ice packs will help.  Swelling and bruising can take several days to resolve.  It is common to experience some constipation if taking pain medication after surgery.  Increasing fluid intake and taking a stool softener (such as Colace) will usually help or prevent this problem from occurring.  A mild laxative (Milk of Magnesia or Miralax) should be taken according to package instructions if there are no bowel movements after 48 hours. Unless discharge instructions indicate otherwise, you may remove your bandages 24-48 hours after surgery, and you may shower at that time.  You may have steri-strips (small skin tapes) in place directly over the incision.  These strips should be left on the skin for 7-10 days.  If your surgeon used skin glue on the incision, you may shower in 24 hours.  The glue will flake off over the next 2-3 weeks.  Any sutures or staples will be  removed at the office during your follow-up visit. ACTIVITIES:  You may resume regular (light) daily activities beginning the next day--such as daily self-care, walking, climbing stairs--gradually increasing activities as tolerated.  You may have sexual intercourse when it is comfortable.  Refrain from any heavy lifting or straining until approved by your doctor. You may drive when you are no longer taking prescription pain medication, you can comfortably wear a seatbelt, and you can safely maneuver your car and apply brakes. RETURN TO WORK:  __________________________________________________________ You should see your doctor in the office for a follow-up appointment approximately 2-3 weeks after your surgery.  Make sure that you call for this appointment within a day or two after you arrive home to insure a convenient appointment time. OTHER INSTRUCTIONS: __________________________________________________________________________________________________________________________ __________________________________________________________________________________________________________________________ WHEN TO CALL YOUR DOCTOR: Fever over 101.0 Inability to urinate Continued bleeding from incision. Increased pain, redness, or drainage from the incision. Increasing abdominal pain  The clinic staff is available to answer your questions during regular business hours.  Please don't hesitate to call and ask to speak to one of the nurses for clinical concerns.  If you have a medical emergency, go to the nearest emergency room or call 911.  A surgeon from [facility] is always on call at the hospital. [address], [address], [place], [place]  [address] ? [address] [address], [place], [place]   [address] [phone] ? [phone] ? FAX [phone] Web site: [email or web address]  

## 2021-04-08 NOTE — Progress Notes (Signed)
[facility] Progress Note  Patient: Robert Arnold    UXL:[id number]  DOA: 04/03/2021    Date of Service: the patient was seen and examined on 04/08/2021  Brief hospital course: 76 year old male with past medical history of IgA nephropathy s/p renal transplant on 2013 as well as BPH, CAD diabetes mellitus and obstructive sleep apnea who had an episode of appendicitis in August of this year and was managed conservatively with antibiotics.  Presented to the emergency room on evening of 12/27 with nausea and vomiting and found to have acute appendicitis again.  Seen by general surgery who recommended conservative management for now with plans to transfer to [facility], patient is followed for his transplant care.  Patient was accepted with plans to transfer later on 12/28 when unfortunately bed became unavailable.  General surgery took patient for appendectomy on 12/30 without incident.  Coumadin has been restarted, patient having some mild pain with full liquids.  Assessment and Plan: Cardiovascular and Mediastinum A-fib ([place]) Assessment & Plan Rate controlled.  Okay by surgery to resume Coumadin.  Digestive * Acute appendicitis Assessment & Plan Greatly appreciate surgery help.  Status post appendectomy.  Monitor for acute blood loss anemia.  Tolerated clears although with full liquids, had some mild pain.  Advance to soft diet.  Endocrine Diabetes mellitus type 2, controlled ([place]) Assessment & Plan Insulin 70/30 resumed.  Still some episodes of mild hypoglycemia, so have added sliding scale.  Other Renal Transplant, s/p 09/2011 Assessment & Plan Able to resume Prograf and CellCept post surgery    Body mass index is 27.98 kg/m.        Consultants: General surgery  Procedures: Appendectomy 12/30  Antimicrobials: IV Zosyn 12/28-present  Code Status: Full code   Subjective: Patient having some increased pain with full liquids.  Objective: Vital signs were  reviewed and unremarkable. Vitals:   04/08/21 0722 04/08/21 2000  BP: (!) 147/87 132/77  Pulse: 84 99  Resp: 16 17  Temp: 98.4 F (36.9 C) 98.2 F (36.8 C)  SpO2: 96% 96%   No intake or output data in the 24 hours ending 04/08/21 2102  Filed Weights   04/03/21 1756  Weight: 83.5 kg   Body mass index is 27.98 kg/m.  Exam:  General: Alert and oriented x3, mild distress from pain HEENT: Normocephalic and atraumatic, mucous membranes are moist Cardiovascular: Irregular rhythm, rate controlled Respiratory: Clear to auscultation bilaterally Abdomen: Soft, tender in the right lower quadrant, nondistended, hypoactive bowel sounds Musculoskeletal: No clubbing or cyanosis or edema Psychiatry: Appropriate, no evidence of psychoses Neurology: No focal deficits  Data Reviewed: My review of labs, imaging, notes and other tests shows no new significant findings.   Disposition:  Status is: Inpatient  Remains inpatient appropriate because: Pain with advancement of diet.  Family Communication: Declined for me to call family DVT Prophylaxis: SCDs Start: 04/04/21 0156    Author: Annita Brod ,MD 04/08/2021 9:02 PM  To reach On-call, see care teams to locate the attending and reach out via [email or web address]. Between 7PM-7AM, please contact night-coverage If you still have difficulty reaching the attending provider, please page the [facility] (Director on Call) for [facility] on amion for assistance.

## 2021-04-08 NOTE — Progress Notes (Signed)
Mobility Specialist Progress Note:   04/08/21 1200  Mobility  Activity Ambulated in hall  Level of Assistance Independent  Assistive Device None  Distance Ambulated (ft) 300 ft  Mobility Ambulated independently in hallway  Mobility Response Tolerated well  Mobility performed by Mobility specialist  $Mobility charge 1 Mobility   Pt with minor pain today, states it has greatly improved since this am. No physical assist required throughout session. Pt left sitting EOB with all needs met.   Nelta Numbers Mobility Specialist  Phone [phone]

## 2021-04-08 NOTE — Progress Notes (Signed)
ANTICOAGULATION CONSULT NOTE - Initial Consult  Pharmacy Consult for Lovenox to Warfarin Bridge Indication: atrial fibrillation  Allergies  Allergen Reactions   Losartan Anaphylaxis   Crestor [Rosuvastatin] Other (See Comments)    Elevated LFT's   Lorazepam     Pt is unsure of reaction    Morphine And Related Other (See Comments)    Has no effect on pt     Patient Measurements: Height: 5\' 8"  (172.7 cm) Weight: 83.5 kg (184 lb) IBW/kg (Calculated) : 68.4   Vital Signs: Temp: 98.4 F (36.9 C) (01/01 0722) Temp Source: Oral (01/01 0722) BP: 147/87 (01/01 0722) Pulse Rate: 84 (01/01 0722)  Labs: Recent Labs    04/06/21 1146 04/07/21 0127 04/08/21 0128  HGB  --  14.0 13.3  HCT  --  41.8 38.3*  PLT  --  140* 168  LABPROT 13.9  --  15.4*  INR 1.1  --  1.2  CREATININE  --   --  1.00     Estimated Creatinine Clearance: 69.2 mL/min (by C-G formula based on SCr of 1 mg/dL).   Medical History: Past Medical History:  Diagnosis Date   Adenomatous colon polyp    Allergy    BPH (benign prostatic hyperplasia)    CHF (congestive heart failure) ([place])    Chronic kidney disease    due IgA nephropathy - s/p kidnet transplant 09/25/11   Diabetes mellitus type 2, controlled ([place])    FHx: heart disease 02/27/2018   Glaucoma    Gout    Histoplasmosis    on itraconazole for prophylaxis   MI (myocardial infarction) ([place]) 09/26/2011   NSTEMI (non-ST elevated myocardial infarction) ([place]) 11/26/2013   2013    OSA (obstructive sleep apnea)    PAF (paroxysmal atrial fibrillation) ([place])    s/p DC-CV in 6/13. Off coumadin due to ureteral bleed   Testosterone deficiency 02/27/2018    Medications:  Scheduled:   buPROPion  300 mg Oral Daily   Chlorhexidine Gluconate Cloth  6 each Topical Q0600   enoxaparin (LOVENOX) injection  80 mg Subcutaneous Q12H   finasteride  5 mg Oral Daily   gabapentin  300 mg Oral QHS   insulin aspart protamine- aspart  12-15 Units Subcutaneous Q supper    insulin aspart protamine- aspart  22 Units Subcutaneous Q breakfast   latanoprost  1 drop Both Eyes QHS   mupirocin ointment  1 application Nasal BID   mycophenolate  360 mg Oral BID   tacrolimus  1.5 mg Oral BID   warfarin  5 mg Oral ONCE-1600   Warfarin - Pharmacist Dosing Inpatient   Does not apply q1600    Assessment: 76 year old male presenting with right lower quadrant abdominal pain. He has a history of Atrial Fibrillation, on warfarin. Now s/p laparoscopic appendectomy and surgery gave the okay to resume warfarin today. Warfarin PTA dose is 5mg  daily. INR was therapeutic on admission at 2.5. Will resume warfarin at home dose.   Goal of Therapy:  INR 2-3 Monitor platelets by anticoagulation protocol: Yes   Plan:  Coumadin 5 mg PO x1 dose @ 16:00 Monitor daily CBC, INR, and s/sx of bleeding  Lestine Box, PharmD PGY2 Infectious Diseases Pharmacy Resident   Please check [email or web address] for unit-specific pharmacy phone numbers

## 2021-04-08 NOTE — Progress Notes (Signed)
Progress Note  2 Days Post-Op  Subjective: Continued stable post op abdominal soreness. No nausea or emesis on FLD. Passing flatus. ambulating  Objective: Vital signs in last 24 hours: Temp:  [97.4 F (36.3 C)-98.4 F (36.9 C)] 98.4 F (36.9 C) (01/01 0722) Pulse Rate:  [84-98] 84 (01/01 0722) Resp:  [16-19] 16 (01/01 0722) BP: (126-147)/(76-87) 147/87 (01/01 0722) SpO2:  [95 %-97 %] 96 % (01/01 0722)    Intake/Output from previous day: 12/31 0701 - 01/01 0700 In: 480 [P.O.:480] Out: -  Intake/Output this shift: No intake/output data recorded.  PE: General: pleasant, WD, male who is laying in bed in NAD HEENT: head is normocephalic, atraumatic. Mouth is pink and moist Heart: regular, rate, and rhythm.  Palpable radial pulses bilaterally Lungs: CTAB.  Respiratory effort nonlabored Abd: soft, ND, +BS, mild TTP greatest over incisions which are with steri strips intact - no erythema or induration. Ecchymosis across lower abdomen MSK: all 4 extremities are symmetrical with no cyanosis, clubbing, or edema. Calves without edema or TTP bilaterally Skin: warm and dry Psych: A&Ox3 with an appropriate affect.    Lab Results:  Recent Labs    04/07/21 0127 04/08/21 0128  WBC 7.6 10.4  HGB 14.0 13.3  HCT 41.8 38.3*  PLT 140* 168    BMET Recent Labs    04/08/21 0128  NA 132*  K 3.9  CL 98  CO2 27  GLUCOSE 274*  BUN 38*  CREATININE 1.00  CALCIUM 9.8    PT/INR Recent Labs    04/06/21 1146 04/08/21 0128  LABPROT 13.9 15.4*  INR 1.1 1.2    CMP     Component Value Date/Time   NA 132 (L) 04/08/2021 0128   NA 141 03/15/2013 0744   K 3.9 04/08/2021 0128   CL 98 04/08/2021 0128   CO2 27 04/08/2021 0128   GLUCOSE 274 (H) 04/08/2021 0128   BUN 38 (H) 04/08/2021 0128   BUN 16 03/15/2013 0744   CREATININE 1.00 04/08/2021 0128   CREATININE 0.89 03/20/2021 0905   CALCIUM 9.8 04/08/2021 0128   PROT 6.4 (L) 04/03/2021 1804   PROT 6.1 03/15/2013 0744    ALBUMIN 3.9 04/03/2021 1804   ALBUMIN 4.3 03/15/2013 0744   AST 32 04/03/2021 1804   ALT 27 04/03/2021 1804   ALKPHOS 105 04/03/2021 1804   BILITOT 2.4 (H) 04/03/2021 1804   GFRNONAA >60 04/08/2021 0128   GFRNONAA 65 05/16/2020 1505   GFRAA 76 05/16/2020 1505   Lipase     Component Value Date/Time   LIPASE <10 (L) 04/03/2021 1804       Studies/Results: No results found.  Anti-infectives: Anti-infectives (From admission, onward)    Start     Dose/Rate Route Frequency Ordered Stop   04/04/21 0445  piperacillin-tazobactam (ZOSYN) IVPB 3.375 g       See Hyperspace for full Linked Orders Report.   3.375 g 12.5 mL/hr over 240 Minutes Intravenous Every 8 hours 04/03/21 2032     04/04/21 0000  piperacillin-tazobactam (ZOSYN) 3.375 GM/50ML IVPB        3.375 g Intravenous Every 8 hours 04/04/21 1143     04/03/21 2045  piperacillin-tazobactam (ZOSYN) IVPB 3.375 g       See Hyperspace for full Linked Orders Report.   3.375 g 100 mL/hr over 30 Minutes Intravenous  Once 04/03/21 2032 04/03/21 2118        Assessment/Plan  Recurrent appendicitis H/o renal transplant - POD 2 s/p laparoscopic appendectomy 12/30 Dr.  [place] with findings of perforated appendicitis and abscess - continue abx 5 days post op, can transition to PO on discharge - advance diet today - continue IS use and ambulation - coumadin restart 12/31  FEN: soft ID: [email or web address] VTE: lovenox, coumadin  Dispo: stable for d/c today from surgical standpoint if tolerating soft diet   LOS: 4 days   Winferd Humphrey, [facility] 04/08/2021, 8:03 AM Please see Amion for pager number during day hours 7:00am-4:30pm

## 2021-04-09 ENCOUNTER — Encounter (HOSPITAL_COMMUNITY): Payer: Self-pay | Admitting: Internal Medicine

## 2021-04-09 ENCOUNTER — Inpatient Hospital Stay (HOSPITAL_COMMUNITY): Payer: Medicare Other

## 2021-04-09 DIAGNOSIS — I25119 Atherosclerotic heart disease of native coronary artery with unspecified angina pectoris: Secondary | ICD-10-CM | POA: Diagnosis not present

## 2021-04-09 DIAGNOSIS — R079 Chest pain, unspecified: Secondary | ICD-10-CM

## 2021-04-09 DIAGNOSIS — E1165 Type 2 diabetes mellitus with hyperglycemia: Secondary | ICD-10-CM

## 2021-04-09 DIAGNOSIS — I482 Chronic atrial fibrillation, unspecified: Secondary | ICD-10-CM | POA: Diagnosis not present

## 2021-04-09 DIAGNOSIS — K3580 Unspecified acute appendicitis: Secondary | ICD-10-CM | POA: Diagnosis not present

## 2021-04-09 LAB — GLUCOSE, CAPILLARY
Glucose-Capillary: 140 mg/dL — ABNORMAL HIGH (ref 70–99)
Glucose-Capillary: 148 mg/dL — ABNORMAL HIGH (ref 70–99)
Glucose-Capillary: 152 mg/dL — ABNORMAL HIGH (ref 70–99)
Glucose-Capillary: 170 mg/dL — ABNORMAL HIGH (ref 70–99)
Glucose-Capillary: 199 mg/dL — ABNORMAL HIGH (ref 70–99)
Glucose-Capillary: 201 mg/dL — ABNORMAL HIGH (ref 70–99)
Glucose-Capillary: 211 mg/dL — ABNORMAL HIGH (ref 70–99)
Glucose-Capillary: 242 mg/dL — ABNORMAL HIGH (ref 70–99)
Glucose-Capillary: 92 mg/dL (ref 70–99)

## 2021-04-09 LAB — BASIC METABOLIC PANEL
Anion gap: 10 (ref 5–15)
BUN: 26 mg/dL — ABNORMAL HIGH (ref 8–23)
CO2: 28 mmol/L (ref 22–32)
Calcium: 9.4 mg/dL (ref 8.9–10.3)
Chloride: 99 mmol/L (ref 98–111)
Creatinine, Ser: 0.84 mg/dL (ref 0.61–1.24)
GFR, Estimated: >60 mL/min (ref >60)
Glucose, Bld: 140 mg/dL — ABNORMAL HIGH (ref 70–99)
Potassium: 3.5 mmol/L (ref 3.5–5.1)
Sodium: 137 mmol/L (ref 135–145)

## 2021-04-09 LAB — CBC
HCT: 39.3 % (ref 39.0–52.0)
Hemoglobin: 13.2 g/dL (ref 13.0–17.0)
MCH: 31.1 pg (ref 26.0–34.0)
MCHC: 33.6 g/dL (ref 30.0–36.0)
MCV: 92.5 fL (ref 80.0–100.0)
Platelets: 145 10*3/uL — ABNORMAL LOW (ref 150–400)
RBC: 4.25 MIL/uL (ref 4.22–5.81)
RDW: 12.4 % (ref 11.5–15.5)
WBC: 7 10*3/uL (ref 4.0–10.5)
nRBC: 0 % (ref 0.0–0.2)

## 2021-04-09 LAB — PROTIME-INR
INR: 1.1 (ref 0.8–1.2)
Prothrombin Time: 14.7 seconds (ref 11.4–15.2)

## 2021-04-09 LAB — TROPONIN I (HIGH SENSITIVITY)
Troponin I (High Sensitivity): 69 ng/L — ABNORMAL HIGH (ref <18)
Troponin I (High Sensitivity): 77 ng/L — ABNORMAL HIGH (ref <18)

## 2021-04-09 MED ORDER — ASPIRIN 81 MG PO CHEW
324.0000 mg | CHEWABLE_TABLET | Freq: Once | ORAL | Status: AC
  Administered 2021-04-09: 324 mg via ORAL
  Filled 2021-04-09: qty 4

## 2021-04-09 MED ORDER — OXYCODONE HCL 5 MG PO TABS: 5.0000 mg | ORAL_TABLET | Freq: Four times a day (QID) | ORAL | 0 refills | Status: AC | PRN

## 2021-04-09 MED ORDER — WARFARIN SODIUM 7.5 MG PO TABS
7.5000 mg | ORAL_TABLET | Freq: Once | ORAL | Status: AC
  Administered 2021-04-09: 7.5 mg via ORAL
  Filled 2021-04-09: qty 1

## 2021-04-09 MED ORDER — ACETAMINOPHEN 325 MG PO TABS: 650.0000 mg | ORAL_TABLET | Freq: Four times a day (QID) | ORAL | Status: DC | PRN

## 2021-04-09 MED ORDER — AMOXICILLIN-POT CLAVULANATE 875-125 MG PO TABS: 1.0000 | ORAL_TABLET | Freq: Two times a day (BID) | ORAL | 0 refills | Status: AC

## 2021-04-09 NOTE — Hospital Course (Addendum)
76 year old man PMH status post renal transplant 2013 presenting with recurrent appendicitis.  Given immunosuppressive state and recurrent disease, appendectomy was recommended, initially plan was to transfer to [facility] where his transplant team is, however transfer cannot be effected so ultimately patient underwent appendectomy 12/30 without incident.  Discharge was considered 1/2 when the patient developed chest pain.  Troponin was mildly elevated.  EKG nonacute.  Cardiology evaluation appreciated.  Chest pain not consistent with ACS, await echocardiogram, if unremarkable, discharged home.

## 2021-04-09 NOTE — Assessment & Plan Note (Signed)
--  stable, continue mycophenolate and tacrolimus

## 2021-04-09 NOTE — Progress Notes (Signed)
ANTICOAGULATION CONSULT NOTE - Initial Consult  Pharmacy Consult for Lovenox to Warfarin Bridge Indication: atrial fibrillation  Allergies  Allergen Reactions   Losartan Anaphylaxis   Crestor [Rosuvastatin] Other (See Comments)    Elevated LFT's   Lorazepam     Pt is unsure of reaction    Morphine And Related Other (See Comments)    Has no effect on pt     Patient Measurements: Height: 5\' 8"  (172.7 cm) Weight: 83.5 kg (184 lb) IBW/kg (Calculated) : 68.4   Vital Signs: Temp: 97.7 F (36.5 C) (01/02 0828) Temp Source: Oral (01/02 0828) BP: 138/74 (01/02 0828) Pulse Rate: 79 (01/02 0828)  Labs: Recent Labs    04/06/21 1146 04/07/21 0127 04/07/21 0127 04/08/21 0128 04/09/21 0326  HGB  --  14.0   < > 13.3 13.2  HCT  --  41.8  --  38.3* 39.3  PLT  --  140*  --  168 145*  LABPROT 13.9  --   --  15.4* 14.7  INR 1.1  --   --  1.2 1.1  CREATININE  --   --   --  1.00 0.84   < > = values in this interval not displayed.     Estimated Creatinine Clearance: 82.4 mL/min (by C-G formula based on SCr of 0.84 mg/dL).   Medical History: Past Medical History:  Diagnosis Date   Adenomatous colon polyp    Allergy    BPH (benign prostatic hyperplasia)    CHF (congestive heart failure) ([place])    Chronic kidney disease    due IgA nephropathy - s/p kidnet transplant 09/25/11   Diabetes mellitus type 2, controlled ([place])    FHx: heart disease 02/27/2018   Glaucoma    Gout    Histoplasmosis    on itraconazole for prophylaxis   MI (myocardial infarction) ([place]) 09/26/2011   NSTEMI (non-ST elevated myocardial infarction) ([place]) 11/26/2013   2013    OSA (obstructive sleep apnea)    PAF (paroxysmal atrial fibrillation) ([place])    s/p DC-CV in 6/13. Off coumadin due to ureteral bleed   Testosterone deficiency 02/27/2018    Medications:  Scheduled:   buPROPion  300 mg Oral Daily   Chlorhexidine Gluconate Cloth  6 each Topical Q0600   enoxaparin (LOVENOX) injection  80 mg  Subcutaneous Q12H   finasteride  5 mg Oral Daily   gabapentin  300 mg Oral QHS   insulin aspart  0-5 Units Subcutaneous QHS   insulin aspart  0-9 Units Subcutaneous TID WC   insulin aspart protamine- aspart  12-15 Units Subcutaneous Q supper   insulin aspart protamine- aspart  22 Units Subcutaneous Q breakfast   latanoprost  1 drop Both Eyes QHS   mupirocin ointment  1 application Nasal BID   mycophenolate  360 mg Oral BID   tacrolimus  1.5 mg Oral BID   Warfarin - Pharmacist Dosing Inpatient   Does not apply q1600    Assessment: 76 year old M on warfarin PTA for atrial fibrillation.  Admit INR 2.5 and therapeutic, last dose 12/26. Warfarin held for surgery, vitamin K 5mg  IV given 12/27, on enoxaparin treatment dose. Now s/p laparoscopic appendectomy and ok to restart. Pharmacy consulted for warfarin dosing.  INR 1.1 after 1 dose as expected. On soft diet, PO intake improving.   Warfarin PTA dose is 5mg  daily.   Goal of Therapy:  INR 2-3 Monitor platelets by anticoagulation protocol: Yes   Plan:  Coumadin 7.5 mg PO x1  Stop enoxaparin when INR is >/= 2 Monitor daily CBC, INR, and s/sx of bleeding If discharging today, suggest resume home dose of 5mg  daily with INR check by Friday and enoxaparin bridge until then  Benetta Spar, PharmD, BCPS, [place] Pharmacist  Please check AMION for all [place] phone numbers After 10:00 PM, call [place]

## 2021-04-09 NOTE — Consult Note (Addendum)
Cardiology Consultation:   Patient ID: Robert Arnold MRN: [id number]; DOB: 1945-08-08  Admit date: 04/03/2021 Date of Consult: 04/09/2021  PCP:  [name], MD   [facility] Providers Cardiologist:  Mertie Moores, MD        Patient Profile:   Robert Arnold is a 76 y.o. 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 Sarajane Jews.  History of Present Illness:   Chart reviewed. Patient underwent kidney transplant at [facility] in 09/2011 and had an NSTEMI 1 day post op with cath showing multivessel CAD. He received a stent to the LAD. He had atrial fib and was cardioverted. EF was 20%. He was treated with Coumadin but this had to be stopped due to ureteral bleeding requiring a stent. He subsequently was readmitted 12/2011 with chest pain and abnormal nuc. Cath showed multivessel CAD felt to require CABG. He underwent CABGx4 at [facility] in 01/2012, details unclear. Post-op course notable for PAF so Coumadin was resumed. Echo 2014 showed EF recovery to 50%. He was followed by the HF clinic until around 2015 then lost to follow-up until re-establishing care with Dr. Acie Fredrickson in 2020 for abnormal EKG with RBBB, LAFB. INR had been followed by primary care. Repeat 2D echo 04/2018 to re-evaluate EF showed EF 50-55%, grade 2 DD, mild LVH, mild AS, mildly dilated ascending aorta, severely dilated LA, mild TR. He was seen for pre-op eval for appendectomy 11/2020 and felt acceptable to proceed. There was appearance of AV dissociation on EKG felt to reflect Mobitz 1 AVB superimposed on his trifascicular block. As he was asymptomatic, he was not felt to require PPM. He was discharged home - general surgery recommended that  he have a conversation with his former transplant surgeon re: possible interval appendectomy since it was sitting behind his transplant.  He presented back to the hospital 04/03/21 with RLQ pain, found to have recurrent appendicitis. Originally the plan was to transfer to [facility] system where transplant team was available. However, because of delays/unable to transfer, he was taken for appendectomy here 04/06/21 with findings of perforated appendicitis and abscess. He has been on Lovenox while Coumadin had to be held. He felt he had a rough morning with soreness in his abdomen. He states around noon he developed a fairly focal area of right sided chest pain that was noticeable when he would take a breath in or shift slightly in bed. He felt a little short of breath with this. No radiation, nausea, vomiting or diaphoresis reported. EKG showed NSR with known trifascicular block without acute change from prior. He received ASA and dilaudid with relief of symptoms. The intermittent duration of pain lasted about 2 hours but he's been pain free the whole rest of the afternoon. In fact he states it was like a miracle this afternoon of how much better he felt overall. At home he does not exercise - will get rare twinge of focal L sided chest pain once or twice a month occurring at random but no anginal-type chest pain or DOE. Here, hsTroponin 69->77. Labs otherwise show normal Cr 0.84, platelets 145, normal Hgb. Today's CXR NAD. He is not tachycardic, tachypneic, or hypoxic. He is resting comfortably in bed without acute complaint.  Past Medical History:  Diagnosis Date   Adenomatous colon polyp    Allergy    BPH (benign prostatic hyperplasia)    CAD (coronary artery disease)    a. NSTEMI 09/2011 s/p PCI to LAD. b. CABG 01/2012 @ [place].   Diabetes mellitus type 2, controlled ([place])    ESRD (end stage renal disease) ([place])    due IgA nephropathy - s/p kidnet transplant 09/25/11   Essential hypertension     FHx: heart disease 02/27/2018   Glaucoma    Gout    Histoplasmosis    on itraconazole for prophylaxis   Ischemic cardiomyopathy    Mild aortic stenosis    Mild dilation of ascending aorta (HCC)    NSTEMI (non-ST elevated myocardial infarction) ([place]) 11/26/2013   2013    OSA (obstructive sleep apnea)    PAF (paroxysmal atrial fibrillation) ([place])    Testosterone deficiency 02/27/2018   Trifascicular block     Past Surgical History:  Procedure Laterality Date   ANTERIOR CERVICAL DECOMP/DISCECTOMY FUSION N/A 05/04/2018   Procedure: Cervical Five-Six Anterior cervical decompression/discectomy/fusion;  Surgeon: Jovita Gamma, MD;  Location: [facility];  Service: Neurosurgery;  Laterality: N/A;  Cervical Five-Six Anterior cervical decompression/discectomy/fusion   AV FISTULA PLACEMENT  2011   Left forearm   COLONOSCOPY     CORONARY ANGIOPLASTY WITH STENT PLACEMENT     CORONARY ARTERY BYPASS GRAFT  2013   KIDNEY TRANSPLANT  09/25/2011   LAPAROSCOPIC APPENDECTOMY N/A 04/06/2021   Procedure: APPENDECTOMY LAPAROSCOPIC;  Surgeon: Rolm Bookbinder, MD;  Location: [place];  Service: General;  Laterality: N/A;   PERITONEAL CATHETER INSERTION  2011     Home Medications:  Prior to Admission medications   Medication Sig Start Date End Date Taking? Authorizing Provider  allopurinol (ZYLOPRIM) 300 MG tablet Take 1/2 tablet Daily  to Prevent  Gout Patient taking differently: Take 300 mg by mouth daily. 11/30/20  Yes Liane Comber, NP  amoxicillin-clavulanate (AUGMENTIN) 875-125 MG tablet Take 1 tablet by mouth 2 (two) times daily for 5 doses. 04/09/21 04/11/21 Yes Winferd Humphrey, PA-C  Ascorbic Acid (VITAMIN C) 1000 MG tablet Take 1,000 mg by mouth daily.   Yes [provider]  aspirin EC 81 MG tablet Take 81 mg by mouth daily.   Yes [provider]  atorvastatin (LIPITOR) 40 MG tablet Take  1 tablet  Daily  for Cholesterol                                                     /                    TAKE ONE TABLET BY MOUTH 04/02/21  Yes [name], MD  B Complex-Biotin-FA (B COMPLETE) TABS Take 1 tablet by mouth daily.   Yes [provider]  Black Pepper-Turmeric (TURMERIC COMPLEX/BLACK PEPPER PO) Take 1,500 mg by mouth daily.   Yes [provider]  buPROPion (WELLBUTRIN XL) 300 MG 24 hr tablet Take  1 tablet  Daily for Mood, Focus & Concentration Patient taking differently: Take  1 tablet  Daily for Mood, Focus & Concentration 09/20/20  Yes [name], MD  Calcium Carbonate-Vitamin D (OYSTER SHELL CALCIUM/D) 250-125 MG-UNIT TABS Take 1 tablet by mouth daily.   Yes [provider]  finasteride (PROSCAR) 5 MG tablet Take  1  tablet  Daily  for Prostate 12/28/20  Yes [name], MD  gabapentin (NEURONTIN) 300 MG capsule TAKE ONE CAPSULE BY MOUTH THREE TIMES A DAY AS NEEDED FOR PAIN Patient taking differently: 300 mg at bedtime. TAKE ONE CAPSULE BY MOUTH THREE TIMES A DAY AS NEEDED FOR PAIN 12/14/20  Yes [name], MD  glucose blood (FREESTYLE TEST STRIPS) test strip Check blood sugar 3 to 4 times daily for medication regulation. 12/04/15  Yes [name], MD  latanoprost (XALATAN) 0.005 % ophthalmic solution Place 1 drop into both eyes at bedtime.  04/11/16  Yes [provider]  MAGNESIUM-OXIDE 400 (241.3 Mg) MG tablet Take 400 mg by mouth 2 (two) times daily.  12/29/15  Yes [provider]  mycophenolate (MYFORTIC) 360 MG TBEC EC tablet TAKE 1 TABLET BY MOUTH TWICE DAILY 05/06/16  Yes [name], MD  Probiotic Product (PROBIOTIC DAILY PO) Take 1 capsule by mouth daily.   Yes [provider]  tacrolimus (PROGRAF) 0.5 MG capsule Take 0.5 mg by mouth 2 (two) times daily. With 1mg  capsule for total of 1.5mg  twice daily   Yes [provider]  tacrolimus (PROGRAF) 1 MG capsule Take 1 mg by mouth 2 (two) times daily. With  0.5g capsule for total of 1.5mg  twice daily   Yes [provider]   traZODone (DESYREL) 50 MG tablet Take  1 tablet  1 hour  before Bedtime  as needed for Sleep Patient taking differently: Take 50 mg by mouth at bedtime. Take  1 tablet  1 hour  before Bedtime  as needed for Sleep 07/19/20  Yes [name], MD  VITAMIN D PO Take 5,000-10,000 Units by mouth See admin instructions. Takes 10,000 unit capsules on MWF and 5,000 unit capsule 4 days a week.   Yes [provider]  warfarin (COUMADIN) 5 MG tablet Take 1 to 2 tablets  /day as directed to Prevent Blood Clots                                   /         TAKE ONE TO TWO TABLETS BY MOUTH Patient taking differently: Take 5 mg by mouth daily at 6 (six) AM. 01/29/21  Yes [name], MD  Zinc 50 MG CAPS Take 50 mg by mouth daily.   Yes [provider]  acetaminophen (TYLENOL) 325 MG tablet Take 2 tablets (650 mg total) by mouth every 6 (six) hours as needed for mild pain or moderate pain. 04/09/21   Winferd Humphrey, PA-C  enoxaparin (LOVENOX) 80 MG/0.8ML injection Inject 0.8 mLs (80 mg total) into the skin every 12 (twelve) hours. 04/04/21   Nita Sells, MD  insulin NPH-regular Human (NOVOLIN 70/30) (70-30) 100 UNIT/ML injection 8 u bid until taking diet 04/04/21   Nita Sells, MD  oxyCODONE (OXY IR/ROXICODONE) 5 MG immediate release tablet Take 1 tablet (5 mg total) by mouth every 6 (six) hours as needed for up to 5 days for moderate pain. 04/09/21 04/14/21  Winferd Humphrey, PA-C  sodium chloride 0.9 % infusion Inject 1,000 mLs into the vein continuous. 04/04/21   Nita Sells, MD    Inpatient Medications: Scheduled Meds:  buPROPion  300 mg Oral Daily   Chlorhexidine Gluconate Cloth  6 each Topical Q0600   enoxaparin (LOVENOX) injection  80 mg Subcutaneous Q12H   finasteride  5 mg Oral Daily   gabapentin  300 mg Oral QHS   insulin aspart  0-5 Units Subcutaneous QHS   insulin aspart  0-9 Units Subcutaneous TID WC   insulin aspart protamine- aspart  12-15 Units  Subcutaneous Q supper   insulin aspart protamine- aspart  22 Units Subcutaneous Q breakfast   latanoprost  1 drop Both Eyes QHS   mupirocin ointment  1 application Nasal BID   mycophenolate  360 mg Oral BID   tacrolimus  1.5 mg Oral BID   Warfarin - Pharmacist Dosing Inpatient   Does not apply q1600   Continuous Infusions:  sodium chloride 50 mL/hr at 04/06/21 1916   piperacillin-tazobactam (ZOSYN)  IV 3.375 g (04/09/21 1323)   PRN Meds: acetaminophen **OR** acetaminophen, diphenhydrAMINE, HYDROmorphone (DILAUDID) injection, oxyCODONE, oxyCODONE  Allergies:    Allergies  Allergen Reactions   Losartan Anaphylaxis   Crestor [Rosuvastatin] Other (See Comments)    Elevated LFT's   Lorazepam     Pt is unsure of reaction    Morphine And Related Other (See Comments)    Has no effect on pt     Social History:   Social History   Socioeconomic History   Marital status: Married    Spouse name: Not on file   Number of children: 1   Years of education: Not on file   Highest education level: Not on file  Occupational History   Occupation: retired  Tobacco Use   Smoking status: Former    Packs/day: 2.00    Years: 24.00    Pack years: 48.00    Types: Cigarettes    Start date: 1968    Quit date: 11/29/1990    Years since quitting: 30.3   Smokeless tobacco: Never  Vaping Use   Vaping Use: Never used  Substance and Sexual Activity   Alcohol use: Yes    Comment: 1-2 a month   Drug use: No   Sexual activity: Not on file  Other Topics Concern   Not on file  Social History Narrative   Not on file   Social Determinants of Health   Financial Resource Strain: Not on file  Food Insecurity: Not on file  Transportation Needs: Not on file  Physical Activity: Not on file  Stress: Not on file  Social Connections: Not on file  Intimate Partner Violence: Not on file    Family History:   Family History  Problem Relation Age of Onset   Diabetes Mother    COPD Mother    Heart  attack Father        Unsure    Kidney disease Sister        no longer speak   Alcoholism Brother    Alzheimer's disease Brother    Alcohol abuse Brother    COPD Brother    Alcohol abuse Maternal Grandfather    Colon cancer Neg Hx    Stomach cancer Neg Hx    Rectal cancer Neg Hx    Esophageal cancer Neg Hx    Liver cancer Neg Hx    Pancreatic cancer Neg Hx      ROS:  Please see the history of present illness.  All other ROS reviewed and negative.     Physical Exam/Data:   Vitals:   04/09/21 0352 04/09/21 0828 04/09/21 1054 04/09/21 1534  BP: (!) 145/76 138/74 113/63 124/85  Pulse: 82 79 85 64  Resp: 18 18 18 18   Temp: 97.7 F (36.5 C) 97.7 F (36.5 C)  97.6 F (36.4 C)  TempSrc:  Oral Oral  Oral  SpO2: 97% 96% 100% 97%  Weight:      Height:        Intake/Output Summary (Last 24 hours) at 04/09/2021 1732 Last data filed at 04/09/2021 0930 Gross per 24 hour  Intake 580 ml  Output --  Net 580 ml   Last 3 Weights 04/03/2021 03/20/2021 01/31/2021  Weight (lbs) 184 lb 190 lb 188 lb  Weight (kg) 83.462 kg 86.183 kg 85.276 kg     Body mass index is 27.98 kg/m.  General: Well developed, well nourished WM, in no acute distress. Head: Normocephalic, atraumatic, sclera non-icteric, no xanthomas, nares are without discharge. Neck: Negative for carotid bruits. JVP not elevated. Lungs: Clear bilaterally to auscultation without wheezes, rales, or rhonchi. Breathing is unlabored. Heart: RRR S1 S2, soft SEM RUSB, no rubs or gallops.  Abdomen: Soft, non-tender, non-distended with normoactive bowel sounds. No rebound/guarding. Extremities: No clubbing or cyanosis. No edema. Distal pedal pulses are 2+ and equal bilaterally. Neuro: Alert and oriented X 3. Moves all extremities spontaneously. Psych:  Responds to questions appropriately with a normal affect.  EKG:  The EKG was personally reviewed and demonstrates:  NSR 77bpm first degree AVB, RBBB, LAFB, prior septal infarct,  nonspecific TW changes  Telemetry:  Telemetry was personally reviewed and demonstrates:  N/A  Relevant CV Studies: Echo 04/2018 - Left ventricle: The cavity size was normal. There was mild    concentric hypertrophy. Systolic function was normal. The    estimated ejection fraction was in the range of 50% to 55%. Wall    motion was normal; there were no regional wall motion    abnormalities. Features are consistent with a pseudonormal left    ventricular filling pattern, with concomitant abnormal relaxation    and increased filling pressure (grade 2 diastolic dysfunction).    Doppler parameters are consistent with indeteminate ventricular    filling pressure.  - Aortic valve: Valve mobility was restricted. There was mild    stenosis. There was trivial regurgitation. Peak velocity (S): 215    cm/s. Mean gradient (S): 11 mm Hg. Valve area (VTI): 1.07 cm^2.    Valve area (Vmax): 1.1 cm^2. Valve area (Vmean): 1.04 cm^2.  - Aorta: Ascending aortic diameter: 37 mm (S).  - Ascending aorta: The ascending aorta was mildly dilated.  - Mitral valve: Transvalvular velocity was within the normal range.    There was no evidence for stenosis. There was trivial    regurgitation. Valve area by pressure half-time: 2.34 cm^2. Valve    area by continuity equation (using LVOT flow): 1.17 cm^2.  - Left atrium: The atrium was severely dilated.  - Right ventricle: The cavity size was normal. Wall thickness was    normal. Systolic function was normal.  - Tricuspid valve: There was mild regurgitation.  - Pulmonary arteries: Systolic pressure was within the normal    range. PA peak pressure: 27 mm Hg (S).   Laboratory Data:  High Sensitivity Troponin:   Recent Labs  Lab 04/09/21 1144 04/09/21 1338  TROPONINIHS 69* 77*     Chemistry Recent Labs  Lab 04/04/21 0548 04/08/21 0128 04/09/21 0326  NA 133* 132* 137  K 4.5 3.9 3.5  CL 101 98 99  CO2 22 27 28   GLUCOSE 181* 274* 140*  BUN 23 38* 26*   CREATININE 0.98 1.00 0.84  CALCIUM 9.1 9.8 9.4  GFRNONAA >60 >60 >60  ANIONGAP 10 7 10     Recent Labs  Lab 04/03/21 1804  PROT 6.4*  ALBUMIN 3.9  AST 32  ALT 27  ALKPHOS 105  BILITOT 2.4*   Lipids No results for input(s): CHOL, TRIG, HDL, LABVLDL, LDLCALC, CHOLHDL in the last 168 hours.  Hematology Recent Labs  Lab 04/07/21 0127 04/08/21 0128 04/09/21 0326  WBC 7.6 10.4 7.0  RBC 4.42 4.18* 4.25  HGB 14.0 13.3 13.2  HCT 41.8 38.3* 39.3  MCV 94.6 91.6 92.5  MCH 31.7 31.8 31.1  MCHC 33.5 34.7 33.6  RDW 12.2 12.4 12.4  PLT 140* 168 145*   Thyroid No results for input(s): TSH, FREET4 in the last 168 hours.  BNPNo results for input(s): BNP, PROBNP in the last 168 hours.  DDimer No results for input(s): DDIMER in the last 168 hours.   Radiology/Studies:  DG CHEST PORT 1 VIEW  Result Date: 04/09/2021 CLINICAL DATA:  Chest pain and shortness of breath.  History of CHF EXAM: PORTABLE CHEST 1 VIEW COMPARISON:  04/03/2021 and older studies. FINDINGS: Previous cardiac surgery, stable. Cardiac silhouette borderline enlarged. No mediastinal or hilar masses. Stable granuloma, left mid lung.  Lungs otherwise clear. No pleural effusion or pneumothorax. Skeletal structures are grossly intact. IMPRESSION: No acute cardiopulmonary disease. Electronically Signed   By: Lajean Manes M.D.   On: 04/09/2021 11:41     Assessment and Plan:   1. Right sided chest pain with known CAD s/p prior PCI & CABG in 2013 - mostly atypical symptoms - mildly elevated troponin is nonspecific at this time - on anticoagulation already with Lovenox per pharmacy - received ASA today, can review plan for antiplatelet in AM if remains surgically stable - was on ASA PTA, follow mild drop in platelets - resume statin when OK with primary team - avoid BB given known trifascicular block and h/o Mobitz 1 AVB - PE considered but symptoms resolved completely and he is not tachycardic, tachypneic or hypoxic, ?transient  pleurisy - will obtain updated echocardiogram - f/u troponin level with next lab draw in AM - pending review with MD  2. Paroxysmal atrial fibrillation - maintaining NSR on EKG - on warfarin as OP, INR historically followed by PCP, now on Lovenox - place on telemetry overnight for review  3. Mild aortic stenosis and mildly dilated ascending aorta 2020 - recheck echocardiogram to trend as above  4. H/o trifascicular block with prior Mobitz 1 AVB - EKG c/w prior - place on telemetry - avoid AVN blocking agents  5. Remote ischemic cardiomyopathy in 2013 - EF improved from 2014 onward, reassess with echocardiogram  6. ESRD s/p kidney transplant 2013 - per IM  Risk Assessment/Risk Scores:     HEAR Score (for undifferentiated chest pain):  security certificate invalid at this time    CHA2DS2-VASc Score = 4   This indicates a 4.8% annual risk of stroke. The patient's score is based upon: CHF History: 1 HTN History: 0 Diabetes History: 1 Stroke History: 0 Vascular Disease History: 1 Age Score: 1 Gender Score: 0      For questions or updates, please contact [facility] Please consult [email or web address] for contact info under    Signed, Charlie Pitter, PA-C  04/09/2021 5:32 PM   Patient seen and examined and agree with Melina Copa, PA-C as detailed above.  In brief, the patient is a 76 y.o. 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   Patient has a known history of CAD s/p CABG x3 in 2013,  ischemic cardiomyopathy with improved EF (20%>50-55%), and known trifascicular block who has  been doing well from CV standpoint. Presented with appendicitis as detailed above. Underwent uncomplicated appendectomy on 12/30 and was planned to be discharged home today when he developed right sided chest pain. Pain is focal and sharp in nature. Was made worse by deep inspiration and palpation. Trop was checked and was mildly elevated 69>77. ECG with NSR with known trifascicular block consistent with previous. Pain resolved by time of our arrival.   Do not suspect cardiac etiology of chest pain as was very localized, sharp and worsened by deep inspiration and palpation of the region. Trop mildly elevated but flat. TTE pending. Will follow-up TTE and AM trop and if stable, no further work-up needed as in-patient.  GEN: No acute distress.   Neck: No JVD Cardiac: RRR, 2/6 systolic murmur at RUSB Respiratory: Clear to auscultation bilaterally. GI: Soft, nondistended MS: No edema; No deformity. Neuro:  Nonfocal  Psych: Normal affect    Plan: -Pain atypical and pleuritic with low suspicion for ischemic etiology -Will check TTE to ensure no acute changes from prior -Trop in AM to ensure remains flat -Lower suspicion of PE as patient has been on [facility] and is not hypoxic or tachycardic -If above reassuring, no further cardiac work-up needed as in-patient -Continue lovenox as bridge to warfarin -Resume statin when able  Gwyndolyn Kaufman, MD

## 2021-04-09 NOTE — Care Management Important Message (Signed)
Important Message  Patient Details  Name: Robert Arnold MRN: [id number] Date of Birth: 25-Jan-1946   Medicare Important Message Given:  Yes     Hannah Beat 04/09/2021, 12:50 PM

## 2021-04-09 NOTE — Progress Notes (Signed)
°  Progress Note Patient: Robert Arnold EHM:[id number] DOB: March 30, 1946 DOA: 04/03/2021     5 DOS: the patient was seen and examined on 04/09/2021   Brief hospital course: 76 year old man PMH status post renal transplant 2013 presenting with recurrent appendicitis.  Given immunosuppressive state and recurrent disease, appendectomy was recommended, initially plan was to transfer to [facility] where his transplant team is, however transfer cannot be effected so ultimately patient underwent appendectomy 12/30 without incident.  Discharge was considered 1/2 when the patient developed chest pain.  Troponin was mildly elevated.  EKG nonacute.  Cardiology evaluation pending.  Assessment and Plan * Acute appendicitis- (present on admission) -- Recurrent and immunosuppressed patient.  Seen by general surgery, ultimately underwent appendectomy 12/30 after transfer to [facility] was unable to be completed.  Did well postoperatively and cleared by general surgery for discharge on oral antibiotics, pain medications and outpatient follow-up.  Chest pain -- PMH CAD.  Right-sided sharp chest pain began this morning without aggravating factor.  Somewhat better after administration of oxygen.  Troponins mildly elevated, EKG nonacute.  Patient reports about 8 episodes of this over the last year.  Some pain with palpation over the right chest.  Symptoms seem mostly atypical but given mildly elevated troponin and recent surgery, will ask cardiology for recommendations.  He has been on anticoagulation, do not think PE likely.  Diabetes mellitus type 2, controlled ([place]) -- Stable, continue insulin 70/30  Renal Transplant, s/p 09/2011 --stable, continue mycophenolate and tacrolimus  A-fib ([place])- (present on admission) -- Stable.  Continue warfarin.  Not on any rate control agents.  Follow-up cardiology recommendations.  Anticipate discharge home when cleared by cardiology.   Subjective:  Feet good until a little bit earlier  today when he developed sharp right-sided chest pain out of the blue.  No specific aggravating or alleviating factors.  Just lying in bed.  Reports approximately 8 episodes of the same type of pain over the last year, seen by Dr. Acie Fredrickson last February 2022 at which time no further intervention was recommended.  Patient still hoping to go home today.  Thinks this pain is similar to previous.  Objective Vital signs were reviewed and unremarkable. Physical Exam Vitals reviewed.  Constitutional:      General: He is not in acute distress.    Appearance: He is not ill-appearing or toxic-appearing.  Cardiovascular:     Rate and Rhythm: Normal rate and regular rhythm.     Heart sounds: No murmur heard. Pulmonary:     Effort: Pulmonary effort is normal. No respiratory distress.     Breath sounds: No wheezing, rhonchi or rales.  Musculoskeletal:     Right lower leg: No edema.     Left lower leg: No edema.  Neurological:     Mental Status: He is alert.  Psychiatric:        Mood and Affect: Mood normal.        Behavior: Behavior normal.   Data Reviewed: Chest x-ray no acute disease, EKG bifascicular block sinus rhythm, first-degree AV block.  No acute changes seen.  Family Communication: none  Disposition: Status is: Inpatient  Remains inpatient appropriate because: cardiology evaluation for CP and elevated troponin  Time spent: 35 minutes  Author: Murray Hodgkins 04/09/2021 6:42 PM  For on call review [email or web address].

## 2021-04-09 NOTE — Progress Notes (Addendum)
Progress Note  3 Days Post-Op  Subjective: Tolerating soft diet well. Pain improved. Passing flatus  Objective: Vital signs in last 24 hours: Temp:  [97.7 F (36.5 C)-98.2 F (36.8 C)] 97.7 F (36.5 C) (01/02 0352) Pulse Rate:  [82-99] 82 (01/02 0352) Resp:  [17-18] 18 (01/02 0352) BP: (132-145)/(76-77) 145/76 (01/02 0352) SpO2:  [96 %-97 %] 97 % (01/02 0352)    Intake/Output from previous day: 01/01 0701 - 01/02 0700 In: 220 [P.O.:120; IV Piggyback:100] Out: -  Intake/Output this shift: No intake/output data recorded.  PE: General: pleasant, WD, male who is laying in bed in NAD HEENT: head is normocephalic, atraumatic. Mouth is pink and moist Heart: regular, rate, and rhythm.  Palpable radial pulses bilaterally Lungs: CTAB.  Respiratory effort nonlabored Abd: soft, ND, +BS, mild TTP greatest over incisions which are with steri strips intact - no erythema or induration. Ecchymosis across lower abdomen MSK: all 4 extremities are symmetrical with no cyanosis, clubbing, or edema. Calves without edema or TTP bilaterally Skin: warm and dry Psych: A&Ox3 with an appropriate affect.    Lab Results:  Recent Labs    04/08/21 0128 04/09/21 0326  WBC 10.4 7.0  HGB 13.3 13.2  HCT 38.3* 39.3  PLT 168 145*    BMET Recent Labs    04/08/21 0128 04/09/21 0326  NA 132* 137  K 3.9 3.5  CL 98 99  CO2 27 28  GLUCOSE 274* 140*  BUN 38* 26*  CREATININE 1.00 0.84  CALCIUM 9.8 9.4    PT/INR Recent Labs    04/08/21 0128 04/09/21 0326  LABPROT 15.4* 14.7  INR 1.2 1.1    CMP     Component Value Date/Time   NA 137 04/09/2021 0326   NA 141 03/15/2013 0744   K 3.5 04/09/2021 0326   CL 99 04/09/2021 0326   CO2 28 04/09/2021 0326   GLUCOSE 140 (H) 04/09/2021 0326   BUN 26 (H) 04/09/2021 0326   BUN 16 03/15/2013 0744   CREATININE 0.84 04/09/2021 0326   CREATININE 0.89 03/20/2021 0905   CALCIUM 9.4 04/09/2021 0326   PROT 6.4 (L) 04/03/2021 1804   PROT 6.1  03/15/2013 0744   ALBUMIN 3.9 04/03/2021 1804   ALBUMIN 4.3 03/15/2013 0744   AST 32 04/03/2021 1804   ALT 27 04/03/2021 1804   ALKPHOS 105 04/03/2021 1804   BILITOT 2.4 (H) 04/03/2021 1804   GFRNONAA >60 04/09/2021 0326   GFRNONAA 65 05/16/2020 1505   GFRAA 76 05/16/2020 1505   Lipase     Component Value Date/Time   LIPASE <10 (L) 04/03/2021 1804       Studies/Results: No results found.  Anti-infectives: Anti-infectives (From admission, onward)    Start     Dose/Rate Route Frequency Ordered Stop   04/04/21 0445  piperacillin-tazobactam (ZOSYN) IVPB 3.375 g       See Hyperspace for full Linked Orders Report.   3.375 g 12.5 mL/hr over 240 Minutes Intravenous Every 8 hours 04/03/21 2032     04/04/21 0000  piperacillin-tazobactam (ZOSYN) 3.375 GM/50ML IVPB        3.375 g Intravenous Every 8 hours 04/04/21 1143     04/03/21 2045  piperacillin-tazobactam (ZOSYN) IVPB 3.375 g       See Hyperspace for full Linked Orders Report.   3.375 g 100 mL/hr over 30 Minutes Intravenous  Once 04/03/21 2032 04/03/21 2118        Assessment/Plan  Recurrent appendicitis H/o renal transplant - POD 3 s/p  laparoscopic appendectomy 12/30 Dr. Donne Hazel with findings of perforated appendicitis and abscess - continue abx 5 days post op, can transition to PO on discharge - tolerating soft diet - continue IS use and ambulation - coumadin restart 12/31  FEN: soft ID: zosyn VTE: lovenox, coumadin  Dispo: stable for d/c today from surgical standpoint. Will send PO abx and pain medications    LOS: 5 days   Winferd Humphrey, [facility] 04/09/2021, 7:59 AM Please see Amion for pager number during day hours 7:00am-4:30pm

## 2021-04-09 NOTE — Assessment & Plan Note (Addendum)
--   Stable.  Continue warfarin.  Not on any rate control agents.  Discussed with cardiology, no need for Lovenox bridge on discharge.  Follow-up with warfarin clinic in the next week.

## 2021-04-09 NOTE — Progress Notes (Signed)
Mobility Specialist Progress Note:   04/09/21 1100  Mobility  Activity Ambulated in hall  Level of Assistance Independent  Assistive Device Other (Comment) (IV Pole)  Distance Ambulated (ft) 450 ft  Mobility Ambulated independently in hallway  Mobility Response Tolerated well  Mobility performed by Mobility specialist  $Mobility charge 1 Mobility   Pt asx during ambulation. Back in bed with all needs met, eager for d/c.  Nelta Numbers Mobility Specialist  Phone [phone]

## 2021-04-09 NOTE — Progress Notes (Signed)
Pt stated that in the past 54min he started experiencing at sharp pain on right side just below his rib. He stated that when he inhales that catches him and he is not able to breathe normally and he feels SOB. MD paged awaiting call back, pt placed on O2 to help with comfort

## 2021-04-09 NOTE — Assessment & Plan Note (Addendum)
--   PMH CAD.  Right-sided sharp chest pain with mildly elevated troponins 1/2, EKG nonacute.  No pain today.  Patient reports about 8 episodes of this over the last year. Symptoms seem mostly atypical, seen by cardiology, no evidence of ACS, recommended echocardiogram, if no significant changes from prior, can discharge home.

## 2021-04-09 NOTE — Assessment & Plan Note (Signed)
--   Stable, continue insulin 70/30

## 2021-04-09 NOTE — Plan of Care (Signed)
  Problem: Clinical Measurements: Goal: Ability to maintain clinical measurements within normal limits will improve Outcome: Progressing   

## 2021-04-09 NOTE — Assessment & Plan Note (Signed)
--   Recurrent and immunosuppressed patient.  Seen by general surgery, ultimately underwent appendectomy 12/30 after transfer to [facility] was unable to be completed.  Did well postoperatively and cleared by general surgery for discharge on oral antibiotics, pain medications and outpatient follow-up.

## 2021-04-10 ENCOUNTER — Inpatient Hospital Stay (HOSPITAL_COMMUNITY): Payer: Medicare Other

## 2021-04-10 DIAGNOSIS — Z940 Kidney transplant status: Secondary | ICD-10-CM | POA: Diagnosis not present

## 2021-04-10 DIAGNOSIS — R079 Chest pain, unspecified: Secondary | ICD-10-CM | POA: Diagnosis not present

## 2021-04-10 DIAGNOSIS — R0789 Other chest pain: Secondary | ICD-10-CM | POA: Diagnosis not present

## 2021-04-10 DIAGNOSIS — I25810 Atherosclerosis of coronary artery bypass graft(s) without angina pectoris: Secondary | ICD-10-CM

## 2021-04-10 DIAGNOSIS — I480 Paroxysmal atrial fibrillation: Secondary | ICD-10-CM

## 2021-04-10 DIAGNOSIS — K3580 Unspecified acute appendicitis: Secondary | ICD-10-CM | POA: Diagnosis not present

## 2021-04-10 LAB — ECHOCARDIOGRAM COMPLETE
AR max vel: 1.43 cm2
AV Area VTI: 1.47 cm2
AV Area mean vel: 1.37 cm2
AV Mean grad: 12 mmHg
AV Peak grad: 21.7 mmHg
Ao pk vel: 2.33 m/s
Calc EF: 45.2 %
Height: 68 in
MV VTI: 1.62 cm2
P 1/2 time: 510 msec
S' Lateral: 3.8 cm
Single Plane A2C EF: 38.4 %
Single Plane A4C EF: 51.3 %
Weight: 2944 oz

## 2021-04-10 LAB — CBC
HCT: 40.7 % (ref 39.0–52.0)
Hemoglobin: 13.4 g/dL (ref 13.0–17.0)
MCH: 31.2 pg (ref 26.0–34.0)
MCHC: 32.9 g/dL (ref 30.0–36.0)
MCV: 94.7 fL (ref 80.0–100.0)
Platelets: 162 10*3/uL (ref 150–400)
RBC: 4.3 MIL/uL (ref 4.22–5.81)
RDW: 12.4 % (ref 11.5–15.5)
WBC: 5.9 10*3/uL (ref 4.0–10.5)
nRBC: 0 % (ref 0.0–0.2)

## 2021-04-10 LAB — BASIC METABOLIC PANEL
Anion gap: 8 (ref 5–15)
BUN: 15 mg/dL (ref 8–23)
CO2: 30 mmol/L (ref 22–32)
Calcium: 9.1 mg/dL (ref 8.9–10.3)
Chloride: 100 mmol/L (ref 98–111)
Creatinine, Ser: 0.86 mg/dL (ref 0.61–1.24)
GFR, Estimated: >60 mL/min (ref >60)
Glucose, Bld: 158 mg/dL — ABNORMAL HIGH (ref 70–99)
Potassium: 3.7 mmol/L (ref 3.5–5.1)
Sodium: 138 mmol/L (ref 135–145)

## 2021-04-10 LAB — TROPONIN I (HIGH SENSITIVITY): Troponin I (High Sensitivity): 73 ng/L — ABNORMAL HIGH (ref <18)

## 2021-04-10 LAB — GLUCOSE, CAPILLARY
Glucose-Capillary: 130 mg/dL — ABNORMAL HIGH (ref 70–99)
Glucose-Capillary: 138 mg/dL — ABNORMAL HIGH (ref 70–99)
Glucose-Capillary: 153 mg/dL — ABNORMAL HIGH (ref 70–99)
Glucose-Capillary: 164 mg/dL — ABNORMAL HIGH (ref 70–99)
Glucose-Capillary: 197 mg/dL — ABNORMAL HIGH (ref 70–99)

## 2021-04-10 LAB — PROTIME-INR
INR: 1.6 — ABNORMAL HIGH (ref 0.8–1.2)
Prothrombin Time: 19.3 seconds — ABNORMAL HIGH (ref 11.4–15.2)

## 2021-04-10 MED ORDER — NOVOLIN 70/30 (70-30) 100 UNIT/ML ~~LOC~~ SUSP: SUBCUTANEOUS | 99 refills | Status: DC

## 2021-04-10 MED ORDER — GABAPENTIN 300 MG PO CAPS: 300.0000 mg | ORAL_CAPSULE | Freq: Every day | ORAL | Status: DC

## 2021-04-10 MED ORDER — WARFARIN SODIUM 5 MG PO TABS
5.0000 mg | ORAL_TABLET | Freq: Every day | ORAL | Status: DC
  Administered 2021-04-10: 5 mg via ORAL
  Filled 2021-04-10: qty 1

## 2021-04-10 NOTE — Progress Notes (Signed)
Progress Note  Patient Name: Robert Arnold Date of Encounter: 04/10/2021  [facility] Cardiologist: Mertie Moores, MD   Subjective   Patient denies chest pain, headache, dizziness, SOB, palpitations. Pain in abdomen that has been improving since his surgery.   Inpatient Medications    Scheduled Meds:  buPROPion  300 mg Oral Daily   Chlorhexidine Gluconate Cloth  6 each Topical Q0600   enoxaparin (LOVENOX) injection  80 mg Subcutaneous Q12H   finasteride  5 mg Oral Daily   gabapentin  300 mg Oral QHS   insulin aspart  0-5 Units Subcutaneous QHS   insulin aspart  0-9 Units Subcutaneous TID WC   insulin aspart protamine- aspart  12-15 Units Subcutaneous Q supper   insulin aspart protamine- aspart  22 Units Subcutaneous Q breakfast   latanoprost  1 drop Both Eyes QHS   mupirocin ointment  1 application Nasal BID   mycophenolate  360 mg Oral BID   tacrolimus  1.5 mg Oral BID   warfarin  5 mg Oral q1600   Warfarin - Pharmacist Dosing Inpatient   Does not apply q1600   Continuous Infusions:  sodium chloride 50 mL/hr at 04/06/21 1916   piperacillin-tazobactam (ZOSYN)  IV 3.375 g (04/10/21 0343)   PRN Meds: acetaminophen **OR** acetaminophen, diphenhydrAMINE, HYDROmorphone (DILAUDID) injection, oxyCODONE, oxyCODONE   Vital Signs    Vitals:   04/09/21 1534 04/09/21 2019 04/10/21 0342 04/10/21 0753  BP: 124/85 124/68 (!) 141/68 124/74  Pulse: 64 88 77 71  Resp: 18 18 17 17   Temp: 97.6 F (36.4 C)  98 F (36.7 C) 98.1 F (36.7 C)  TempSrc: Oral  Oral Oral  SpO2: 97% 100% 95% 96%  Weight:      Height:        Intake/Output Summary (Last 24 hours) at 04/10/2021 1023 Last data filed at 04/10/2021 0343 Gross per 24 hour  Intake 580 ml  Output --  Net 580 ml   Last 3 Weights 04/03/2021 03/20/2021 01/31/2021  Weight (lbs) 184 lb 190 lb 188 lb  Weight (kg) 83.462 kg 86.183 kg 85.276 kg      Telemetry    Sinus rhythm, occasional PVCs, trigeminy. One 4 beat run of  Vtach - Personally Reviewed  ECG    No new tracings - Personally Reviewed  Physical Exam   GEN: No acute distress.  Well developed, well nourished.  Neck: No JVD Cardiac: RRR, grade 2/6 systolic murmur present at RUSB. Radial pulses 2+ bilaterally   Respiratory: Clear to auscultation bilaterally. GI: Soft, nontender, non-distended  MS: No edema; No deformity.  Neuro:  Nonfocal  Psych: Normal affect   Labs    High Sensitivity Troponin:   Recent Labs  Lab 04/09/21 1144 04/09/21 1338 04/10/21 0500  TROPONINIHS 69* 77* 73*     Chemistry Recent Labs  Lab 04/03/21 1804 04/03/21 1921 04/08/21 0128 04/09/21 0326 04/10/21 0400  NA 137   < > 132* 137 138  K 4.1   < > 3.9 3.5 3.7  CL 102   < > 98 99 100  CO2 25   < > 27 28 30   GLUCOSE 114*   < > 274* 140* 158*  BUN 23   < > 38* 26* 15  CREATININE 0.82   < > 1.00 0.84 0.86  CALCIUM 9.3   < > 9.8 9.4 9.1  PROT 6.4*  --   --   --   --   ALBUMIN 3.9  --   --   --   --  AST 32  --   --   --   --   ALT 27  --   --   --   --   ALKPHOS 105  --   --   --   --   BILITOT 2.4*  --   --   --   --   GFRNONAA >60   < > >60 >60 >60  ANIONGAP 10   < > 7 10 8    < > = values in this interval not displayed.    Lipids No results for input(s): CHOL, TRIG, HDL, LABVLDL, LDLCALC, CHOLHDL in the last 168 hours.  Hematology Recent Labs  Lab 04/08/21 0128 04/09/21 0326 04/10/21 0400  WBC 10.4 7.0 5.9  RBC 4.18* 4.25 4.30  HGB 13.3 13.2 13.4  HCT 38.3* 39.3 40.7  MCV 91.6 92.5 94.7  MCH 31.8 31.1 31.2  MCHC 34.7 33.6 32.9  RDW 12.4 12.4 12.4  PLT 168 145* 162   Thyroid No results for input(s): TSH, FREET4 in the last 168 hours.  BNPNo results for input(s): BNP, PROBNP in the last 168 hours.  DDimer No results for input(s): DDIMER in the last 168 hours.   Radiology    DG CHEST PORT 1 VIEW  Result Date: 04/09/2021 CLINICAL DATA:  Chest pain and shortness of breath.  History of CHF EXAM: PORTABLE CHEST 1 VIEW COMPARISON:   04/03/2021 and older studies. FINDINGS: Previous cardiac surgery, stable. Cardiac silhouette borderline enlarged. No mediastinal or hilar masses. Stable granuloma, left mid lung.  Lungs otherwise clear. No pleural effusion or pneumothorax. Skeletal structures are grossly intact. IMPRESSION: No acute cardiopulmonary disease. Electronically Signed   By: Lajean Manes M.D.   On: 04/09/2021 11:41    Cardiac Studies   Echo 04/2018 - Left ventricle: The cavity size was normal. There was mild    concentric hypertrophy. Systolic function was normal. The    estimated ejection fraction was in the range of 50% to 55%. Wall    motion was normal; there were no regional wall motion    abnormalities. Features are consistent with a pseudonormal left    ventricular filling pattern, with concomitant abnormal relaxation    and increased filling pressure (grade 2 diastolic dysfunction).    Doppler parameters are consistent with indeteminate ventricular    filling pressure.  - Aortic valve: Valve mobility was restricted. There was mild    stenosis. There was trivial regurgitation. Peak velocity (S): 215    cm/s. Mean gradient (S): 11 mm Hg. Valve area (VTI): 1.07 cm^2.    Valve area (Vmax): 1.1 cm^2. Valve area (Vmean): 1.04 cm^2.  - Aorta: Ascending aortic diameter: 37 mm (S).  - Ascending aorta: The ascending aorta was mildly dilated.  - Mitral valve: Transvalvular velocity was within the normal range.    There was no evidence for stenosis. There was trivial    regurgitation. Valve area by pressure half-time: 2.34 cm^2. Valve    area by continuity equation (using LVOT flow): 1.17 cm^2.  - Left atrium: The atrium was severely dilated.  - Right ventricle: The cavity size was normal. Wall thickness was    normal. Systolic function was normal.  - Tricuspid valve: There was mild regurgitation.  - Pulmonary arteries: Systolic pressure was within the normal    range. PA peak pressure: 27 mm Hg (S).     Patient  Profile     76 y.o. 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 Sarajane Jews.  Assessment & Plan    Right sided chest pain with known CAD s/p prior PCI & CABG in 2013 - mostly atypical symptoms, pain has resolved - mildly elevated troponin is nonspecific at this time, HSTN 77>>73 on 1/3 - on anticoagulation already with Lovenox bridging to warfarin per pharmacy - Patient on outpatient regiment of ASA 81 mg Daily. Daily ASA was held on admission. Patient developed chest pain on 1/2 and was given 324 mg ASA. Platelets were low on admission at 128 and have risen to 162. Consider continuing to hold ASA outpatient, will discuss antiplatelet therapy with MD   - resume statin when OK with primary team - avoid BB given known trifascicular block and h/o Mobitz 1 AVB - PE considered but symptoms resolved completely and he is not tachycardic, tachypneic or hypoxic, ?transient pleurisy - Updated echocardiogram ordered and scheduled for this afternoon    Paroxysmal atrial fibrillation - maintaining NSR on EKG - on warfarin as OP, INR historically followed by PCP. 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

## 2021-04-10 NOTE — Progress Notes (Signed)
Robert Arnold to be D/C'd  per MD order.  Discussed with the patient and all questions fully answered.   IV catheter discontinued intact. Site without signs and symptoms of complications. Dressing and pressure applied.  An After Visit Summary was printed and given to the patient.   D/c education completed with patient/family including follow up instructions, medication list, d/c activities limitations if indicated, with other d/c instructions as indicated by MD - patient able to verbalize understanding, all questions fully answered.   Patient instructed to return to ED, call 911, or call MD for any changes in condition.   Patient to be escorted via [place], and D/C home via private auto.

## 2021-04-10 NOTE — Progress Notes (Signed)
Progress Note  4 Days Post-Op  Subjective: Tolerating diet, abdominal pain subsiding, now rating it a 2, endorses flatus but no bowel movement yet.  Worked up for chest pain yesterday and is to undergo repeat echocardiogram this afternoon.  Currently complaining of left shoulder pain (this may be gas pain) and sores on his lower lip  Objective: Vital signs in last 24 hours: Temp:  [97.6 F (36.4 C)-98.1 F (36.7 C)] 98.1 F (36.7 C) (01/03 0753) Pulse Rate:  [64-88] 71 (01/03 0753) Resp:  [17-18] 17 (01/03 0753) BP: (124-141)/(68-85) 124/74 (01/03 0753) SpO2:  [95 %-100 %] 96 % (01/03 0753)    Intake/Output from previous day: 01/02 0701 - 01/03 0700 In: 940 [P.O.:840; IV Piggyback:100] Out: -  Intake/Output this shift: No intake/output data recorded.  PE: General: pleasant, WD, male who is laying in bed in NAD HEENT: head is normocephalic, atraumatic. Mouth is pink and moist Heart: regular, rate, and rhythm.  Palpable radial pulses bilaterally Lungs: CTAB.  Respiratory effort nonlabored Abd: soft, ND, +BS, mild TTP greatest over incisions which are with steri strips intact - no erythema or induration. Ecchymosis across lower abdomen MSK: all 4 extremities are symmetrical with no cyanosis, clubbing, or edema. Calves without edema or TTP bilaterally Skin: warm and dry Psych: A&Ox3 with an appropriate affect.    Lab Results:  Recent Labs    04/09/21 0326 04/10/21 0400  WBC 7.0 5.9  HGB 13.2 13.4  HCT 39.3 40.7  PLT 145* 162    BMET Recent Labs    04/09/21 0326 04/10/21 0400  NA 137 138  K 3.5 3.7  CL 99 100  CO2 28 30  GLUCOSE 140* 158*  BUN 26* 15  CREATININE 0.84 0.86  CALCIUM 9.4 9.1    PT/INR Recent Labs    04/09/21 0326 04/10/21 0400  LABPROT 14.7 19.3*  INR 1.1 1.6*    CMP     Component Value Date/Time   NA 138 04/10/2021 0400   NA 141 03/15/2013 0744   K 3.7 04/10/2021 0400   CL 100 04/10/2021 0400   CO2 30 04/10/2021 0400    GLUCOSE 158 (H) 04/10/2021 0400   BUN 15 04/10/2021 0400   BUN 16 03/15/2013 0744   CREATININE 0.86 04/10/2021 0400   CREATININE 0.89 03/20/2021 0905   CALCIUM 9.1 04/10/2021 0400   PROT 6.4 (L) 04/03/2021 1804   PROT 6.1 03/15/2013 0744   ALBUMIN 3.9 04/03/2021 1804   ALBUMIN 4.3 03/15/2013 0744   AST 32 04/03/2021 1804   ALT 27 04/03/2021 1804   ALKPHOS 105 04/03/2021 1804   BILITOT 2.4 (H) 04/03/2021 1804   GFRNONAA >60 04/10/2021 0400   GFRNONAA 65 05/16/2020 1505   GFRAA 76 05/16/2020 1505   Lipase     Component Value Date/Time   LIPASE <10 (L) 04/03/2021 1804       Studies/Results: DG CHEST PORT 1 VIEW  Result Date: 04/09/2021 CLINICAL DATA:  Chest pain and shortness of breath.  History of CHF EXAM: PORTABLE CHEST 1 VIEW COMPARISON:  04/03/2021 and older studies. FINDINGS: Previous cardiac surgery, stable. Cardiac silhouette borderline enlarged. No mediastinal or hilar masses. Stable granuloma, left mid lung.  Lungs otherwise clear. No pleural effusion or pneumothorax. Skeletal structures are grossly intact. IMPRESSION: No acute cardiopulmonary disease. Electronically Signed   By: Lajean Manes M.D.   On: 04/09/2021 11:41    Anti-infectives: Anti-infectives (From admission, onward)    Start     Dose/Rate Route Frequency Ordered  Stop   04/09/21 0000  amoxicillin-clavulanate (AUGMENTIN) 875-125 MG tablet        1 tablet Oral 2 times daily 04/09/21 1037 04/11/21 2359   04/04/21 0445  piperacillin-tazobactam (ZOSYN) IVPB 3.375 g       See Hyperspace for full Linked Orders Report.   3.375 g 12.5 mL/hr over 240 Minutes Intravenous Every 8 hours 04/03/21 2032     04/04/21 0000  piperacillin-tazobactam (ZOSYN) 3.375 GM/50ML IVPB  Status:  Discontinued        3.375 g Intravenous Every 8 hours 04/04/21 1143 04/09/21    04/03/21 2045  piperacillin-tazobactam (ZOSYN) IVPB 3.375 g       See Hyperspace for full Linked Orders Report.   3.375 g 100 mL/hr over 30 Minutes  Intravenous  Once 04/03/21 2032 04/03/21 2118        Assessment/Plan  Recurrent appendicitis H/o renal transplant - POD 4 s/p laparoscopic appendectomy 12/30 Dr. Donne Hazel with findings of perforated appendicitis and abscess - continue abx 5 days post op, can transition to PO on discharge - tolerating soft diet - continue IS use and ambulation - coumadin restart 12/31  FEN: soft ID: zosyn VTE: lovenox, coumadin  Dispo: Discharge when cleared by cardiology and medical team.  Surgery team available for questions or concerns at any time.   LOS: 6 days   Clovis Riley, [facility] 04/10/2021, 11:31 AM Please see Amion for pager number during day hours 7:00am-4:30pm

## 2021-04-10 NOTE — Progress Notes (Signed)
°  Echocardiogram 2D Echocardiogram has been performed.  Robert Arnold 04/10/2021, 5:04 PM

## 2021-04-10 NOTE — Plan of Care (Signed)
Problem: Clinical Measurements: Goal: Ability to maintain clinical measurements within normal limits will improve Outcome: Completed/Met Goal: Will remain free from infection Outcome: Completed/Met Goal: Diagnostic test results will improve Outcome: Completed/Met Goal: Respiratory complications will improve Outcome: Completed/Met Goal: Cardiovascular complication will be avoided Outcome: Completed/Met   Problem: Activity: Goal: Risk for activity intolerance will decrease Outcome: Completed/Met   Problem: Nutrition: Goal: Adequate nutrition will be maintained Outcome: Completed/Met   Problem: Coping: Goal: Level of anxiety will decrease Outcome: Completed/Met   Problem: Elimination: Goal: Will not experience complications related to bowel motility Outcome: Completed/Met Goal: Will not experience complications related to urinary retention Outcome: Completed/Met   Problem: Pain Managment: Goal: General experience of comfort will improve Outcome: Completed/Met   Problem: Safety: Goal: Ability to remain free from injury will improve Outcome: Completed/Met   Problem: Skin Integrity: Goal: Risk for impaired skin integrity will decrease Outcome: Completed/Met   Problem: Education: Goal: Knowledge of General Education information will improve Description: Including pain rating scale, medication(s)/side effects and non-pharmacologic comfort measures Outcome: Completed/Met   Problem: Health Behavior/Discharge Planning: Goal: Ability to manage health-related needs will improve Outcome: Completed/Met   Problem: Clinical Measurements: Goal: Ability to maintain clinical measurements within normal limits will improve Outcome: Completed/Met Goal: Will remain free from infection Outcome: Completed/Met Goal: Diagnostic test results will improve Outcome: Completed/Met Goal: Respiratory complications will improve Outcome: Completed/Met Goal: Cardiovascular complication will  be avoided Outcome: Completed/Met   Problem: Activity: Goal: Risk for activity intolerance will decrease Outcome: Completed/Met   Problem: Nutrition: Goal: Adequate nutrition will be maintained Outcome: Completed/Met   Problem: Coping: Goal: Level of anxiety will decrease Outcome: Completed/Met   Problem: Elimination: Goal: Will not experience complications related to bowel motility Outcome: Completed/Met Goal: Will not experience complications related to urinary retention Outcome: Completed/Met   Problem: Pain Managment: Goal: General experience of comfort will improve Outcome: Completed/Met   Problem: Safety: Goal: Ability to remain free from injury will improve Outcome: Completed/Met   Problem: Skin Integrity: Goal: Risk for impaired skin integrity will decrease Outcome: Completed/Met

## 2021-04-10 NOTE — Progress Notes (Signed)
Mobility Specialist Criteria Algorithm Info.   04/10/21 1510  Mobility  Activity Ambulated in hall (in chair before and after)  Range of Motion/Exercises Active;All extremities  Level of Assistance Independent  Assistive Device Other (Comment) (IV Pole)  Distance Ambulated (ft) 480 ft  Mobility Ambulated with assistance in hallway  Mobility Response Tolerated well  Mobility performed by Mobility specialist  Bed Position Chair   Patient ambulated in hallway independently with steady gait. Tolerated ambulation well without complaint or incident. Was in chair with all needs met.   04/10/2021 3:19 PM

## 2021-04-10 NOTE — Progress Notes (Signed)
°  Progress Note Patient: Robert Arnold:[id number] DOB: 02/20/1946 DOA: 04/03/2021     6 DOS: the patient was seen and examined on 04/10/2021   Brief hospital course: 76 year old man PMH status post renal transplant 2013 presenting with recurrent appendicitis.  Given immunosuppressive state and recurrent disease, appendectomy was recommended, initially plan was to transfer to [facility] where his transplant team is, however transfer cannot be effected so ultimately patient underwent appendectomy 12/30 without incident.  Discharge was considered 1/2 when the patient developed chest pain.  Troponin was mildly elevated.  EKG nonacute.  Cardiology evaluation appreciated.  Chest pain not consistent with ACS, await echocardiogram, if unremarkable, discharged home.  Assessment and Plan * Acute appendicitis- (present on admission) -- Recurrent and immunosuppressed patient.  Seen by general surgery, ultimately underwent appendectomy 12/30 after transfer to [facility] was unable to be completed.  Did well postoperatively and cleared by general surgery for discharge on oral antibiotics, pain medications and outpatient follow-up.  Chest pain -- PMH CAD.  Right-sided sharp chest pain with mildly elevated troponins 1/2, EKG nonacute.  No pain today.  Patient reports about 8 episodes of this over the last year. Symptoms seem mostly atypical, seen by cardiology, no evidence of ACS, recommended echocardiogram, if no significant changes from prior, can discharge home.  Diabetes mellitus type 2, controlled ([place]) -- Stable, continue insulin 70/30  Renal Transplant, s/p 09/2011 --stable, continue mycophenolate and tacrolimus  A-fib ([place])- (present on admission) -- Stable.  Continue warfarin.  Not on any rate control agents.  Discussed with cardiology, no need for Lovenox bridge on discharge.  Follow-up with warfarin clinic in the next week.     Subjective:  Feels fine, no chest pain.  Objective Vital signs were  reviewed and unremarkable. Physical Exam Constitutional:      General: He is not in acute distress.    Appearance: He is not ill-appearing.  Cardiovascular:     Rate and Rhythm: Normal rate and regular rhythm.     Heart sounds: No murmur heard.    Comments: Telemetry SR Pulmonary:     Effort: Pulmonary effort is normal. No respiratory distress.     Breath sounds: No wheezing, rhonchi or rales.  Psychiatric:        Mood and Affect: Mood normal.        Behavior: Behavior normal.   Data Reviewed: CBG stable, BMP noted, troponin 73, flat; CBC stable, INR 1.6  Family Communication: none  Disposition: Status is: Inpatient  Remains inpatient appropriate because: await echo         Time spent: 35 minutes  Author: Murray Hodgkins 04/10/2021 4:18 PM  For on call review [email or web address].

## 2021-04-10 NOTE — Progress Notes (Signed)
ANTICOAGULATION CONSULT NOTE - Initial Consult  Pharmacy Consult for Lovenox bridge and Warfarin  Indication: atrial fibrillation  Allergies  Allergen Reactions   Losartan Anaphylaxis   Crestor [Rosuvastatin] Other (See Comments)    Elevated LFT's   Lorazepam     Pt is unsure of reaction    Morphine And Related Other (See Comments)    Has no effect on pt     Patient Measurements: Height: 5\' 8"  (172.7 cm) Weight: 83.5 kg (184 lb) IBW/kg (Calculated) : 68.4   Vital Signs: Temp: 98.1 F (36.7 C) (01/03 0753) Temp Source: Oral (01/03 0753) BP: 124/74 (01/03 0753) Pulse Rate: 71 (01/03 0753)  Labs: Recent Labs    04/08/21 0128 04/09/21 0326 04/09/21 1144 04/09/21 1338 04/10/21 0400 04/10/21 0500  HGB 13.3 13.2  --   --  13.4  --   HCT 38.3* 39.3  --   --  40.7  --   PLT 168 145*  --   --  162  --   LABPROT 15.4* 14.7  --   --  19.3*  --   INR 1.2 1.1  --   --  1.6*  --   CREATININE 1.00 0.84  --   --  0.86  --   TROPONINIHS  --   --  69* 77*  --  73*    Estimated Creatinine Clearance: 80.5 mL/min (by C-G formula based on SCr of 0.86 mg/dL).   Medical History: Past Medical History:  Diagnosis Date   Adenomatous colon polyp    Allergy    BPH (benign prostatic hyperplasia)    CAD (coronary artery disease)    a. NSTEMI 09/2011 s/p PCI to LAD. b. CABG 01/2012 @ [facility].   Diabetes mellitus type 2, controlled ([place])    ESRD (end stage renal disease) ([place])    due IgA nephropathy - s/p [facility] transplant 09/25/11   Essential hypertension    FHx: heart disease 02/27/2018   Glaucoma    Gout    Histoplasmosis    on itraconazole for prophylaxis   Ischemic cardiomyopathy    Mild aortic stenosis    Mild dilation of ascending aorta ([facility])    NSTEMI (non-ST elevated myocardial infarction) ([place]) 11/26/2013   2013    OSA (obstructive sleep apnea)    PAF (paroxysmal atrial fibrillation) ([facility])    Testosterone deficiency 02/27/2018   Trifascicular block     Medications:   Scheduled:   buPROPion  300 mg Oral Daily   Chlorhexidine Gluconate Cloth  6 each Topical Q0600   enoxaparin (LOVENOX) injection  80 mg Subcutaneous Q12H   finasteride  5 mg Oral Daily   gabapentin  300 mg Oral QHS   insulin aspart  0-5 Units Subcutaneous QHS   insulin aspart  0-9 Units Subcutaneous TID WC   insulin aspart protamine- aspart  12-15 Units Subcutaneous Q supper   insulin aspart protamine- aspart  22 Units Subcutaneous Q breakfast   latanoprost  1 drop Both Eyes QHS   mupirocin ointment  1 application Nasal BID   mycophenolate  360 mg Oral BID   tacrolimus  1.5 mg Oral BID   Warfarin - Pharmacist Dosing Inpatient   Does not apply q1600    Assessment: 76 year old M on warfarin PTA for atrial fibrillation.  Admit INR 2.5 and therapeutic, last dose 12/26. Warfarin held for surgery, vitamin K 5mg  IV given 12/27, on enoxaparin treatment dose. Now s/p laparoscopic appendectomy and ok to restart. Pharmacy consulted for warfarin  dosing.  INR 1.6 and trending up after 7.5mg  dose. On soft diet, PO intake variable 25- 85%.   Warfarin PTA dose is 5mg  daily.   Goal of Therapy:  INR 2-3 Monitor platelets by anticoagulation protocol: Yes   Plan:  Warfarin 5mg  daily Stop enoxaparin when INR is >/= 2 Monitor daily CBC, INR, and s/sx of bleeding If discharging today, suggest resume home dose of 5mg  daily with INR check by Friday. Consider holding enoxaparin bridge given INR is rising and will likely be at goal within 1-2 days   Benetta Spar, PharmD, BCPS, [facility] Clinical Pharmacist  Please check AMION for all [facility] phone numbers After 10:00 PM, call [place] [phone]

## 2021-04-10 NOTE — Anesthesia Postprocedure Evaluation (Signed)
Anesthesia Post Note  Patient: Robert Arnold  Procedure(s) Performed: APPENDECTOMY LAPAROSCOPIC     Patient location during evaluation: PACU Anesthesia Type: General Level of consciousness: awake and alert Pain management: pain level controlled Vital Signs Assessment: post-procedure vital signs reviewed and stable Respiratory status: spontaneous breathing, nonlabored ventilation and respiratory function stable Cardiovascular status: stable and blood pressure returned to baseline Anesthetic complications: no   No notable events documented.  Last Vitals:  Vitals:   04/09/21 2019 04/10/21 0342  BP: 124/68 (!) 141/68  Pulse: 88 77  Resp: 18 17  Temp:  36.7 C  SpO2: 100% 95%    Last Pain:  Vitals:   04/10/21 0342  TempSrc: Oral  PainSc:                  Audry Pili

## 2021-04-11 ENCOUNTER — Encounter: Payer: Self-pay | Admitting: Internal Medicine

## 2021-04-11 ENCOUNTER — Other Ambulatory Visit: Payer: Self-pay | Admitting: Adult Health

## 2021-04-11 LAB — SURGICAL PATHOLOGY

## 2021-04-16 ENCOUNTER — Other Ambulatory Visit: Payer: Self-pay | Admitting: Internal Medicine

## 2021-04-16 MED ORDER — HYOSCYAMINE SULFATE 0.125 MG SL SUBL: SUBLINGUAL_TABLET | SUBLINGUAL | 1 refills | Status: DC

## 2021-04-16 NOTE — Discharge Summary (Signed)
Physician Discharge Summary   Patient: Robert Arnold MRN: [id number] DOB: 05-21-1945  Admit date:     04/03/2021  Discharge date: 04/10/2021  Discharge Physician: Murray Hodgkins   PCP: [name], MD   Recommendations at discharge:   Follow-up surgery for appendicitis. Follow-up chest pain, echocardiogram showed mild depression LVEF 45-50% with global hypokinesis.  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  Discharge Diagnoses Principal Problem:   Acute appendicitis Active Problems:   Renal Transplant, s/p 09/2011   Diabetes mellitus type 2, controlled ([place])   Chest pain   A-fib ([place])   Gout   CAD (coronary artery disease) of artery bypass graft  Hospital Course   76 year old man PMH status post renal transplant 2013 presenting with recurrent appendicitis.  Given immunosuppressive state and recurrent disease, appendectomy was recommended, initially plan was to transfer to [facility] where his transplant team is, however transfer cannot be effected so ultimately patient underwent appendectomy 12/30 without incident.  Discharge was considered 1/2 when the patient developed chest pain.  Troponin was mildly elevated.  EKG nonacute.  Cardiology evaluation appreciated.  Chest pain not consistent with ACS, echocardiogram showed no acute issues but did show abnormalities that can be followed in the outpatient setting per Dr. Harrell Gave.  * Acute appendicitis- (present on admission) -- Recurrent and immunosuppressed patient.  Seen by general surgery, ultimately underwent appendectomy 12/30 after [facility] was unable to be completed.  Did well postoperatively and cleared by general surgery for discharge on oral antibiotics, pain medications and outpatient follow-up.  Chest pain -- PMH CAD.  Right-sided sharp chest pain with mildly elevated troponins 1/2, EKG nonacute.  No pain today.  Patient reports about 8 episodes of this over the last year. Symptoms seem mostly  atypical, seen by cardiology, no evidence of ACS, recommended echocardiogram, which showed no acute issues but did show mild depression of LVEF and aortic aneurysm both which can be followed up in the outpatient setting.  Diabetes mellitus type 2, controlled ([place]) -- Stable, continue insulin 70/30  Renal Transplant, s/p 09/2011 --stable, continue mycophenolate and tacrolimus  A-fib ([place])- (present on admission) -- Stable.  Continue warfarin.  Not on any rate control agents.  Discussed with cardiology, no need for Lovenox bridge on discharge.  Follow-up with warfarin clinic in the next week.      Consultants: cardiology Procedures performed: none  Disposition: Home Diet recommendation: Cardiac diet  DISCHARGE MEDICATION: Allergies as of 04/10/2021       Reactions   Losartan Anaphylaxis   Crestor [rosuvastatin] Other (See Comments)   Elevated LFT's   Lorazepam    Pt is unsure of reaction    Morphine And Related Other (See Comments)   Has no effect on pt         Medication List     STOP taking these medications    atorvastatin 40 MG tablet Commonly known as: LIPITOR   B Complete Tabs   PROBIOTIC DAILY PO   vitamin C 1000 MG tablet   VITAMIN D PO       TAKE these medications    acetaminophen 325 MG tablet Commonly known as: TYLENOL Take 2 tablets (650 mg total) by mouth every 6 (six) hours as needed for mild pain or moderate pain.   allopurinol 300 MG tablet Commonly known as: ZYLOPRIM Take 1/2 tablet Daily  to Prevent  Gout What changed:  how much to take how to take this when to take this additional instructions  aspirin EC 81 MG tablet Take 81 mg by mouth daily.   buPROPion 300 MG 24 hr tablet Commonly known as: WELLBUTRIN XL Take  1 tablet  Daily for Mood, Focus & Concentration   finasteride 5 MG tablet Commonly known as: PROSCAR Take  1 tablet  Daily  for Prostate   gabapentin 300 MG capsule Commonly known as: NEURONTIN Take 1 capsule  (300 mg total) by mouth at bedtime. What changed:  how much to take how to take this when to take this additional instructions   glucose blood test strip Commonly known as: FREESTYLE TEST STRIPS Check blood sugar 3 to 4 times daily for medication regulation.   latanoprost 0.005 % ophthalmic solution Commonly known as: XALATAN Place 1 drop into both eyes at bedtime.   MAGnesium-Oxide 400 (241.3 Mg) MG tablet Generic drug: magnesium oxide Take 400 mg by mouth 2 (two) times daily.   mycophenolate 360 MG Tbec EC tablet Commonly known as: MYFORTIC TAKE 1 TABLET BY MOUTH TWICE DAILY   NovoLIN 70/30 (70-30) 100 UNIT/ML injection Generic drug: insulin NPH-regular Human Inject 12-22 Units into the skin in the morning and at bedtime. 22 units in the AM, 12-15 units in the PM. What changed: additional instructions   Oyster Shell Calcium/D 250-125 MG-UNIT Tabs Take 1 tablet by mouth daily.   tacrolimus 1 MG capsule Commonly known as: PROGRAF Take 1 mg by mouth 2 (two) times daily. With  0.5g capsule for total of 1.5mg  twice daily   tacrolimus 0.5 MG capsule Commonly known as: PROGRAF Take 0.5 mg by mouth 2 (two) times daily. With 1mg  capsule for total of 1.5mg  twice daily   traZODone 50 MG tablet Commonly known as: DESYREL Take  1 tablet  1 hour  before Bedtime  as needed for Sleep What changed:  how much to take how to take this when to take this   TURMERIC COMPLEX/BLACK PEPPER PO Take 1,500 mg by mouth daily.   warfarin 5 MG tablet Commonly known as: COUMADIN Take 1 to 2 tablets  /day as directed to Prevent Blood Clots                                   /         TAKE ONE TO TWO TABLETS BY MOUTH What changed:  how much to take how to take this when to take this additional instructions   Zinc 50 MG Caps Take 50 mg by mouth daily.       ASK your doctor about these medications    amoxicillin-clavulanate 875-125 MG tablet Commonly known as: Augmentin Take 1  tablet by mouth 2 (two) times daily for 5 doses. Ask about: Should I take this medication?   oxyCODONE 5 MG immediate release tablet Commonly known as: Oxy IR/ROXICODONE Take 1 tablet (5 mg total) by mouth every 6 (six) hours as needed for up to 5 days for moderate pain. Ask about: Should I take this medication?               Discharge Care Instructions  (From admission, onward)           Start     Ordered   04/10/21 0000  Discharge wound care:       Comments: Per surgery team.   04/10/21 1848            Follow-up Information     Donne Hazel,  Rodman Key, MD Follow up in 3 week(s).   Specialty: General Surgery Contact information: [address] [place] [address] [phone]         Nahser, Wonda Cheng, MD. Go on 05/29/2021.   Specialty: Cardiology Why: Appt with Dr. Acie Fredrickson 05/29/21 at 10:40 AM Contact information: [place] [address] [place] [address] [phone]                 Discharge Exam: Danley Danker Weights   04/03/21 1756  Weight: 83.5 kg   See progress note same day  Condition at discharge: good  The results of significant diagnostics from this hospitalization (including imaging, microbiology, ancillary and laboratory) are listed below for reference.   Imaging Studies: CT ABDOMEN PELVIS W CONTRAST  Result Date: 04/03/2021 CLINICAL DATA:  Right lower quadrant pain. EXAM: CT ABDOMEN AND PELVIS WITH CONTRAST TECHNIQUE: Multidetector CT imaging of the abdomen and pelvis was performed using the standard protocol following bolus administration of intravenous contrast. CONTRAST:  136mL OMNIPAQUE IOHEXOL 300 MG/ML  SOLN COMPARISON:  February 10, 2021 FINDINGS: Lower chest: Multiple sternal wires and sternal fixation plates and screws are seen. No acute abnormality. Hepatobiliary: There is diffuse fatty infiltration of the liver parenchyma. No focal liver abnormality is seen. Multiple tiny gallstones are seen within the lumen of  an otherwise normal-appearing gallbladder. Pancreas: Unremarkable. No pancreatic ductal dilatation or surrounding inflammatory changes. Spleen: Numerous punctate calcified granulomas are seen scattered throughout the spleen. Adrenals/Urinary Tract: Adrenal glands are unremarkable. The native kidneys are atrophic in appearance with diffuse renal cortical thinning. Multiple bilateral simple renal cysts of various sizes are seen. The largest measures approximately 4.0 cm x 3.8 cm and is seen within the lateral aspect of the mid right kidney. A renal transplant is seen within the pelvis on the right. Bladder is unremarkable. Stomach/Bowel: There is a very small hiatal hernia. The appendix is markedly thickened and inflamed. No evidence of bowel dilatation. A segment of decompressed small bowel is suspected along the anterior aspect of the cecum (axial CT images 43 through 50, CT series 2). Noninflamed diverticula are noted within the proximal to mid sigmoid colon. Vascular/Lymphatic: Aortic atherosclerosis. No enlarged abdominal or pelvic lymph nodes. Reproductive: Prostate is unremarkable. Other: No abdominal wall hernia or abnormality. No abdominopelvic ascites. Musculoskeletal: No acute or significant osseous findings. IMPRESSION: 1. Findings consistent with acute appendicitis. Inflammation of the adjacent portion of the cecum cannot be excluded. 2. Cholelithiasis. 3. Fatty liver. 4. Multiple bilateral simple renal cysts of various sizes. 5. Right pelvic renal transplant. 6. Sigmoid diverticulosis. 7. Aortic atherosclerosis. Aortic Atherosclerosis (ICD10-I70.0). Electronically Signed   By: Virgina Norfolk M.D.   On: 04/03/2021 19:41   DG CHEST PORT 1 VIEW  Result Date: 04/09/2021 CLINICAL DATA:  Chest pain and shortness of breath.  History of CHF EXAM: PORTABLE CHEST 1 VIEW COMPARISON:  04/03/2021 and older studies. FINDINGS: Previous cardiac surgery, stable. Cardiac silhouette borderline enlarged. No  mediastinal or hilar masses. Stable granuloma, left mid lung.  Lungs otherwise clear. No pleural effusion or pneumothorax. Skeletal structures are grossly intact. IMPRESSION: No acute cardiopulmonary disease. Electronically Signed   By: Lajean Manes M.D.   On: 04/09/2021 11:41   DG Chest Portable 1 View  Result Date: 04/03/2021 CLINICAL DATA:  Acute right lower quadrant abdominal pain. EXAM: PORTABLE CHEST 1 VIEW COMPARISON:  February 01, 2021. FINDINGS: Stable cardiomediastinal silhouette. Sternotomy wires are noted. Both lungs are clear. The visualized skeletal structures are unremarkable. IMPRESSION:  No active disease. Electronically Signed   By: Marijo Conception M.D.   On: 04/03/2021 19:34   ECHOCARDIOGRAM COMPLETE  Result Date: 04/10/2021    ECHOCARDIOGRAM REPORT   Patient Name:   TAVIUS TURGEON Date of Exam: 04/10/2021 Medical Rec #:  [id number]       Height:       68.0 in Accession #:    [id number]      Weight:       184.0 lb Date of Birth:  [birth year]      BSA:          1.973 m Patient Age:    42 years        BP:           124/74 mmHg Patient Gender: M               HR:           80 bpm. Exam Location:  Inpatient Procedure: 2D Echo, Cardiac Doppler and Color Doppler Indications:    Chest pain  History:        Patient has prior history of Echocardiogram examinations, most                 recent 04/17/2018. CHF, CAD and Previous Myocardial Infarction,                 Prior CABG, Arrythmias:Atrial Fibrillation; Risk Factors:Sleep                 Apnea, Diabetes, Former Smoker and Dyslipidemia. Renal                 transplant. CKD.  Sonographer:    Clayton Lefort RDCS (AE) Referring Phys: [place]  1. Left ventricular ejection fraction, by estimation, is 45 to 50%. The left ventricle has mildly decreased function. The left ventricle demonstrates global hypokinesis. Left ventricular diastolic parameters are indeterminate. Elevated left ventricular end-diastolic pressure.  2. Right  ventricular systolic function is normal. The right ventricular size is normal. There is normal pulmonary artery systolic pressure.  3. The mitral valve is normal in structure. No evidence of mitral valve regurgitation. No evidence of mitral stenosis.  4. The aortic valve is calcified. Aortic valve regurgitation is mild. Mild to moderate aortic valve stenosis. Aortic regurgitation PHT measures 510 msec. Aortic valve area, by VTI measures 1.47 cm. Aortic valve mean gradient measures 12.0 mmHg. Aortic valve Vmax measures 2.33 m/s.  5. Aneurysm of the ascending aorta, measuring 41 mm. There is mild dilatation of the aortic root, measuring 38 mm.  6. The inferior vena cava is normal in size with greater than 50% respiratory variability, suggesting right atrial pressure of 3 mmHg. FINDINGS  Left Ventricle: Left ventricular ejection fraction, by estimation, is 45 to 50%. The left ventricle has mildly decreased function. The left ventricle demonstrates global hypokinesis. The left ventricular internal cavity size was normal in size. There is  no left ventricular hypertrophy. Left ventricular diastolic parameters are indeterminate. Elevated left ventricular end-diastolic pressure. Right Ventricle: The right ventricular size is normal. No increase in right ventricular wall thickness. Right ventricular systolic function is normal. There is normal pulmonary artery systolic pressure. The tricuspid regurgitant velocity is 2.13 m/s, and  with an assumed right atrial pressure of 3 mmHg, the estimated right ventricular systolic pressure is 16.3 mmHg. Left [facility]: Left atrial size was normal in size. Right [facility]: Right atrial size was normal in size. Pericardium: There is no  evidence of pericardial effusion. Mitral Valve: The mitral valve is normal in structure. No evidence of mitral valve regurgitation. No evidence of mitral valve stenosis. MV peak gradient, 12.1 mmHg. The mean mitral valve gradient is 5.0 mmHg. Tricuspid Valve:  The tricuspid valve is normal in structure. Tricuspid valve regurgitation is not demonstrated. No evidence of tricuspid stenosis. Aortic Valve: The aortic valve is calcified. Aortic valve regurgitation is mild. Aortic regurgitation PHT measures 510 msec. Mild to moderate aortic stenosis is present. Aortic valve mean gradient measures 12.0 mmHg. Aortic valve peak gradient measures 21.7 mmHg. Aortic valve area, by VTI measures 1.47 cm. Pulmonic Valve: The pulmonic valve was normal in structure. Pulmonic valve regurgitation is not visualized. No evidence of pulmonic stenosis. Aorta: There is mild dilatation of the aortic root, measuring 38 mm. There is an aneurysm involving the ascending aorta measuring 41 mm. Venous: The inferior vena cava is normal in size with greater than 50% respiratory variability, suggesting right atrial pressure of 3 mmHg. IAS/Shunts: No atrial level shunt detected by color flow Doppler.  LEFT VENTRICLE PLAX 2D LVIDd:         4.70 cm LVIDs:         3.80 cm LV PW:         1.60 cm LV IVS:        1.40 cm LVOT diam:     2.00 cm      3D Volume EF: LV SV:         63           3D EF:        48 % LV SV Index:   32           LV EDV:       195 ml LVOT Area:     3.14 cm     LV ESV:       102 ml                             LV SV:        93 ml  LV Volumes (MOD) LV vol d, MOD A2C: 134.0 ml LV vol d, MOD A4C: 134.0 ml LV vol s, MOD A2C: 82.5 ml LV vol s, MOD A4C: 65.3 ml LV SV MOD A2C:     51.5 ml LV SV MOD A4C:     134.0 ml LV SV MOD BP:      61.5 ml RIGHT VENTRICLE RV Basal diam:  3.10 cm RV S prime:     10.80 cm/s TAPSE (M-mode): 1.9 cm LEFT ATRIUM             Index        RIGHT ATRIUM           Index LA diam:        3.90 cm 1.98 cm/m   RA Area:     15.30 cm LA Vol (A2C):   67.1 ml 34.02 ml/m  RA Volume:   36.90 ml  18.71 ml/m LA Vol (A4C):   56.2 ml 28.49 ml/m LA Biplane Vol: 61.9 ml 31.38 ml/m  AORTIC VALVE AV Area (Vmax):    1.43 cm AV Area (Vmean):   1.37 cm AV Area (VTI):     1.47 cm AV  Vmax:           233.00 cm/s AV Vmean:          157.000 cm/s AV VTI:  0.428 m AV Peak Grad:      21.7 mmHg AV Mean Grad:      12.0 mmHg LVOT Vmax:         106.00 cm/s LVOT Vmean:        68.600 cm/s LVOT VTI:          0.200 m LVOT/AV VTI ratio: 0.47 AI PHT:            510 msec  AORTA Ao Root diam: 3.80 cm Ao Asc diam:  4.10 cm MITRAL VALVE             TRICUSPID VALVE MV Area VTI:  1.62 cm   TR Peak grad:   18.1 mmHg MV Peak grad: 12.1 mmHg  TR Vmax:        213.00 cm/s MV Mean grad: 5.0 mmHg MV Vmax:      1.74 m/s   SHUNTS MV Vmean:     95.1 cm/s  Systemic VTI:  0.20 m                          Systemic Diam: 2.00 cm Kardie Tobb DO Electronically signed by [name] DO Signature Date/Time: 04/10/2021/5:49:36 PM    Final     Microbiology: Results for orders placed or performed during the hospital encounter of 04/03/21  Resp Panel by RT-PCR (Flu A&B, Covid) Nasopharyngeal Swab     Status: None   Collection Time: 04/03/21  6:49 PM   Specimen: Nasopharyngeal Swab; Nasopharyngeal(NP) swabs in vial transport medium  Result Value Ref Range Status   SARS Coronavirus 2 by RT PCR NEGATIVE NEGATIVE Final    Comment: (NOTE) SARS-CoV-2 target nucleic acids are NOT DETECTED.  The SARS-CoV-2 RNA is generally detectable in upper respiratory specimens during the acute phase of infection. The lowest concentration of SARS-CoV-2 viral copies this assay can detect is 138 copies/mL. A negative result does not preclude SARS-Cov-2 infection and should not be used as the sole basis for treatment or other patient management decisions. A negative result may occur with  improper specimen collection/handling, submission of specimen other than nasopharyngeal swab, presence of viral mutation(s) within the areas targeted by this assay, and inadequate number of viral copies(<138 copies/mL). A negative result must be combined with clinical observations, patient history, and epidemiological information. The expected  result is Negative.  Fact Sheet for Patients:  EntrepreneurPulse.com.au  Fact Sheet for Healthcare Providers:  IncredibleEmployment.be  This test is no t yet approved or cleared by the [place] FDA and  has been authorized for detection and/or diagnosis of SARS-CoV-2 by FDA under an Emergency Use Authorization (EUA). This EUA will remain  in effect (meaning this test can be used) for the duration of the COVID-19 declaration under Section 564(b)(1) of the Act, 21 U.S.C.section 360bbb-3(b)(1), unless the authorization is terminated  or revoked sooner.       Influenza A by PCR NEGATIVE NEGATIVE Final   Influenza B by PCR NEGATIVE NEGATIVE Final    Comment: (NOTE) The Xpert Xpress SARS-CoV-2/FLU/RSV plus assay is intended as an aid in the diagnosis of influenza from Nasopharyngeal swab specimens and should not be used as a sole basis for treatment. Nasal washings and aspirates are unacceptable for Xpert Xpress SARS-CoV-2/FLU/RSV testing.  Fact Sheet for Patients: EntrepreneurPulse.com.au  Fact Sheet for Healthcare Providers: IncredibleEmployment.be  This test is not yet approved or cleared by the [place] FDA and has been authorized for detection and/or diagnosis of SARS-CoV-2 by  FDA under an Emergency Use Authorization (EUA). This EUA will remain in effect (meaning this test can be used) for the duration of the COVID-19 declaration under Section 564(b)(1) of the Act, 21 U.S.C. section 360bbb-3(b)(1), unless the authorization is terminated or revoked.  Performed at [facility], [address], [place], [place] [address]   Surgical pcr screen     Status: Abnormal   Collection Time: 04/06/21  1:50 PM   Specimen: Nasal Mucosa; Nasal Swab  Result Value Ref Range Status   MRSA, PCR NEGATIVE NEGATIVE Final   Staphylococcus aureus POSITIVE (A) NEGATIVE Final    Comment:  (NOTE) The Xpert SA Assay (FDA approved for NASAL specimens in patients 70 years of age and older), is one component of a comprehensive surveillance program. It is not intended to diagnose infection nor to guide or monitor treatment. Performed at [facility], [place] [address] [place], [place] [address]     Labs: CBC: Recent Labs  Lab 04/10/21 0400  WBC 5.9  HGB 13.4  HCT 40.7  MCV 94.7  PLT 517   Basic Metabolic Panel: Recent Labs  Lab 04/10/21 0400  NA 138  K 3.7  CL 100  CO2 30  GLUCOSE 158*  BUN 15  CREATININE 0.86  CALCIUM 9.1   Liver Function Tests: No results for input(s): AST, ALT, ALKPHOS, BILITOT, PROT, ALBUMIN in the last 168 hours. CBG: Recent Labs  Lab 04/10/21 0004 04/10/21 0340 04/10/21 0750 04/10/21 1148 04/10/21 1839  GLUCAP 197* 130* 164* 153* 138*    Discharge time spent: greater than 30 minutes.  Signed: Murray Hodgkins, MD [facility] 04/16/2021

## 2021-04-17 DIAGNOSIS — I77819 Aortic ectasia, unspecified site: Secondary | ICD-10-CM | POA: Insufficient documentation

## 2021-04-17 NOTE — Progress Notes (Signed)
Hospital follow up  Assessment and Plan: Hospital visit follow up for:   Zakhari was seen today for hospitalization follow-up.  Diagnoses and all orders for this visit:  S/P appendectomy Uncomplicated, some residual weakness, hoarseness, monitor for resolution Has follow up with surgeon next week  Port sites healing well  Paroxysmal atrial fibrillation (HCC) Chronic anticoagulation Check INR and will adjust medication according to labs.  Discussed if patient falls to immediately contact office or go to ER. Discussed foods that can increase or decrease Coumadin levels. Patient understands to call the office before starting a new medication. Follow up in one month.  -     CBC with Differential/Platelet -     Protime-INR  Acquired dilation of ascending aorta and aortic root ([place]) Has cardiology follow up next week, discussed will need ECHO 1 year for follow up. Control BP.  Go to the ER if any chest pain, shortness of breath, nausea, dizziness, severe HA, changes vision/speech  Med management -     Magnesium -     BASIC METABOLIC PANEL WITH GFR  Hematuria, unspecified type -     Urinalysis, Routine w reflex microscopic -     Urine Culture  Unstable gait Neuro normal; encouraged slow gentle exercise, gradually increase back to usual walking regimen. If persistent consider PT referral.    All medications were reviewed with patient and family and fully reconciled. All questions answered fully, and patient and family members were encouraged to call the office with any further questions or concerns. Discussed goal to avoid readmission related to this diagnosis.   Medications Discontinued During This Encounter  Medication Reason   Black Pepper-Turmeric (TURMERIC COMPLEX/BLACK PEPPER PO) Patient Preference   Over 40 minutes of exam, counseling, chart review, and complex, high/moderate level critical decision making was performed this visit.   Future Appointments  Date Time  Provider [place]  04/25/2021 10:30 AM Liane Comber, NP [facility] None  05/29/2021 10:40 AM Nahser, Wonda Cheng, MD [facility]  06/04/2021  9:30 AM [name], MD [facility] None  07/04/2021  9:00 AM Liane Comber, NP [facility] None     HPI 76 y.o.male presents for follow up for transition from recent hospitalization or SNIF stay. Admit date to the hospital was 04/03/21, patient was discharged from the hospital on 04/10/21 and our clinical staff contacted the office the day after discharge to set up a follow up appointment. The discharge summary, medications, and diagnostic test results were reviewed before meeting with the patient. The patient was admitted for:     Discharge Diagnoses Principal Problem:   Acute appendicitis Active Problems:   Renal Transplant, s/p 09/2011   Diabetes mellitus type 2, controlled ([place])   Chest pain   A-fib ([place])   Gout   CAD (coronary artery disease) of artery bypass graft   Hospital Course per Dr. Murray Hodgkins 04/10/2021 discharge note: 76 year old man PMH status post renal transplant 2013 presenting with recurrent appendicitis.  Given immunosuppressive state and recurrent disease, appendectomy was recommended, initially plan was to transfer to [facility] where his transplant team is, however transfer cannot be effected so ultimately patient underwent appendectomy 12/30 without incident.  Discharge was considered 1/2 when the patient developed chest pain.  Troponin was mildly elevated.  EKG nonacute.  Cardiology evaluation appreciated.  Chest pain not consistent with ACS, echocardiogram showed no acute issues but did show abnormalities that can be followed in the outpatient setting per Dr. Harrell Gave.   * Acute appendicitis- (present on admission) --  Recurrent and immunosuppressed patient.  Seen by general surgery, ultimately underwent appendectomy 12/30 after transfer to [facility] was unable to be completed.  Did well postoperatively  and cleared by general surgery for discharge on oral antibiotics, pain medications and outpatient follow-up.   Chest pain -- PMH CAD.  Right-sided sharp chest pain with mildly elevated troponins 1/2, EKG nonacute.  No pain today.  Patient reports about 8 episodes of this over the last year. Symptoms seem mostly atypical, seen by cardiology, no evidence of ACS, recommended echocardiogram, which showed no acute issues but did show mild depression of LVEF and aortic aneurysm both which can be followed up in the outpatient setting.   Diabetes mellitus type 2, controlled ([place]) -- Stable, continue insulin 70/30   Renal Transplant, s/p 09/2011 --stable, continue mycophenolate and tacrolimus   A-fib ([place])- (present on admission) -- Stable.  Continue warfarin.  Not on any rate control agents.  Discussed with cardiology, no need for Lovenox bridge on discharge.  Follow-up with warfarin clinic in the next week.   Consultants: cardiology Procedures performed: none  Disposition: Home Diet recommendation: Cardiac diet  Recommendations at discharge:  Follow-up surgery for appendicitis. Follow-up chest pain, echocardiogram showed mild depression LVEF 45-50% with global hypokinesis.  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   Hospital follow up 04/18/2021:  BP 122/64    Pulse 74    Temp 97.9 F (36.6 C)    Wt 177 lb (80.3 kg)    SpO2 99%    BMI 26.91 kg/m   Patient with hx of renal transplant on immunosuppression, a. Fib on coumadin follows up on recent admission for acute appendicitis s/p appendectomy. He also had episode of angina with benign workup excepting mild depression LVEF 45-50% with global hypokinesis, also ascending aorta and root dilation (Aneurysm of the ascending aorta, measuring 41 mm. There is mild dilatation of the aortic root, measuring 38 mm.) recommended for outpatient follow up. He has follow up with cardiolgy Dr. Acie Fredrickson planned on 05/29/2021.   He reports has follow up  with surgeon next week.  He reports had constipation following by diarrhea, is resolving.  Some pain, has oxycodone and tylenol, hasn't used anything, is improving and tolerable.  Has some mild persistent hoarseness since discharge.   He reports noted pink urine yesterday and this AM, denies clots, denies dysuria, urgency, frequency, flank pain. Does have known renal calculi in transplanted kidney. Denies flank pain, fever/chills.    He has noted some unsteadiness on his feet since discharge, denies HA, numbness/tingling, localized weakness. Denies falls or vertigo/dizziness.   Patient is on Coumadin for A. Fib.  He is back to taking coumadin 5 mg daily, has been very steady on this dose for last 1 year. Denies missed doses, new meds/abx since discharge. No bleeding sx other than mild hematuria.  Patients last INR is  Lab Results  Component Value Date   INR 1.6 (H) 04/10/2021   INR 1.1 04/09/2021   INR 1.2 04/08/2021     Home health is not involved.   Images while in the hospital: CT ABDOMEN PELVIS W CONTRAST  Result Date: 04/03/2021 CLINICAL DATA:  Right lower quadrant pain. EXAM: CT ABDOMEN AND PELVIS WITH CONTRAST TECHNIQUE: Multidetector CT imaging of the abdomen and pelvis was performed using the standard protocol following bolus administration of intravenous contrast. CONTRAST:  139mL OMNIPAQUE IOHEXOL 300 MG/ML  SOLN COMPARISON:  February 10, 2021 FINDINGS: Lower chest: Multiple sternal wires and sternal fixation plates and  screws are seen. No acute abnormality. Hepatobiliary: There is diffuse fatty infiltration of the liver parenchyma. No focal liver abnormality is seen. Multiple tiny gallstones are seen within the lumen of an otherwise normal-appearing gallbladder. Pancreas: Unremarkable. No pancreatic ductal dilatation or surrounding inflammatory changes. Spleen: Numerous punctate calcified granulomas are seen scattered throughout the spleen. Adrenals/Urinary Tract: Adrenal glands  are unremarkable. The native kidneys are atrophic in appearance with diffuse renal cortical thinning. Multiple bilateral simple renal cysts of various sizes are seen. The largest measures approximately 4.0 cm x 3.8 cm and is seen within the lateral aspect of the mid right kidney. A renal transplant is seen within the pelvis on the right. Bladder is unremarkable. Stomach/Bowel: There is a very small hiatal hernia. The appendix is markedly thickened and inflamed. No evidence of bowel dilatation. A segment of decompressed small bowel is suspected along the anterior aspect of the cecum (axial CT images 43 through 50, CT series 2). Noninflamed diverticula are noted within the proximal to mid sigmoid colon. Vascular/Lymphatic: Aortic atherosclerosis. No enlarged abdominal or pelvic lymph nodes. Reproductive: Prostate is unremarkable. Other: No abdominal wall hernia or abnormality. No abdominopelvic ascites. Musculoskeletal: No acute or significant osseous findings. IMPRESSION: 1. Findings consistent with acute appendicitis. Inflammation of the adjacent portion of the cecum cannot be excluded. 2. Cholelithiasis. 3. Fatty liver. 4. Multiple bilateral simple renal cysts of various sizes. 5. Right pelvic renal transplant. 6. Sigmoid diverticulosis. 7. Aortic atherosclerosis. Aortic Atherosclerosis (ICD10-I70.0). Electronically Signed   By: Virgina Norfolk M.D.   On: 04/03/2021 19:41   DG Chest Portable 1 View  Result Date: 04/03/2021 CLINICAL DATA:  Acute right lower quadrant abdominal pain. EXAM: PORTABLE CHEST 1 VIEW COMPARISON:  February 01, 2021. FINDINGS: Stable cardiomediastinal silhouette. Sternotomy wires are noted. Both lungs are clear. The visualized skeletal structures are unremarkable. IMPRESSION: No active disease. Electronically Signed   By: Marijo Conception M.D.   On: 04/03/2021 19:34     Current Outpatient Medications (Endocrine & Metabolic):    insulin NPH-regular Human (NOVOLIN 70/30) (70-30) 100  UNIT/ML injection, Inject 12-22 Units into the skin in the morning and at bedtime. 22 units in the AM, 12-15 units in the PM.    Current Outpatient Medications (Analgesics):    acetaminophen (TYLENOL) 325 MG tablet, Take 2 tablets (650 mg total) by mouth every 6 (six) hours as needed for mild pain or moderate pain.   allopurinol (ZYLOPRIM) 300 MG tablet, Take 1/2 tablet Daily  to Prevent  Gout (Patient taking differently: Take 300 mg by mouth daily.)   aspirin EC 81 MG tablet, Take 81 mg by mouth daily.  Current Outpatient Medications (Hematological):    warfarin (COUMADIN) 5 MG tablet, Take 1 to 2 tablets  /day as directed to Prevent Blood Clots                                   /         TAKE ONE TO TWO TABLETS BY MOUTH (Patient taking differently: Take 5 mg by mouth daily at 6 (six) AM.)  Current Outpatient Medications (Other):    buPROPion (WELLBUTRIN XL) 300 MG 24 hr tablet, Take  1 tablet  Daily for Mood, Focus & Concentration (Patient taking differently: Take  1 tablet  Daily for Mood, Focus & Concentration)   finasteride (PROSCAR) 5 MG tablet, Take  1 tablet  Daily  for Prostate   gabapentin (  NEURONTIN) 300 MG capsule, Take 1 capsule (300 mg total) by mouth at bedtime.   glucose blood (FREESTYLE TEST STRIPS) test strip, Check blood sugar 3 to 4 times daily for medication regulation.   hyoscyamine (LEVSIN SL) 0.125 MG SL tablet, Dissolve  1 to 2 tablets under tongue every 4 hours if needed for Nausea, vomiting, cramping or diarrhea   latanoprost (XALATAN) 0.005 % ophthalmic solution, Place 1 drop into both eyes at bedtime.    mycophenolate (MYFORTIC) 360 MG TBEC EC tablet, TAKE 1 TABLET BY MOUTH TWICE DAILY   tacrolimus (PROGRAF) 0.5 MG capsule, Take 0.5 mg by mouth 2 (two) times daily. With 1mg  capsule for total of 1.5mg  twice daily   tacrolimus (PROGRAF) 1 MG capsule, Take 1 mg by mouth 2 (two) times daily. With  0.5g capsule for total of 1.5mg  twice daily   traZODone (DESYREL) 50 MG  tablet, Take  1 tablet  1 hour  before Bedtime  as needed for Sleep (Patient taking differently: Take 50 mg by mouth at bedtime. Take  1 tablet  1 hour  before Bedtime  as needed for Sleep)   Calcium Carbonate-Vitamin D (OYSTER SHELL CALCIUM/D) 250-125 MG-UNIT TABS, Take 1 tablet by mouth daily. (Patient not taking: Reported on 04/18/2021)   MAGNESIUM-OXIDE 400 (241.3 Mg) MG tablet, Take 400 mg by mouth 2 (two) times daily.  (Patient not taking: Reported on 04/18/2021)   Zinc 50 MG CAPS, Take 50 mg by mouth daily. (Patient not taking: Reported on 04/18/2021)  Past Medical History:  Diagnosis Date   Adenomatous colon polyp    Allergy    BPH (benign prostatic hyperplasia)    CAD (coronary artery disease)    a. NSTEMI 09/2011 s/p PCI to LAD. b. CABG 01/2012 @ [facility].   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   Physical Exam: Filed Weights   04/18/21 1139  Weight: 177 lb (80.3 kg)   BP 122/64    Pulse 74    Temp 97.9 F (36.6 C)    Wt 177 lb (80.3 kg)    SpO2 99%    BMI 26.91 kg/m  General Appearance: Well nourished, in no apparent distress. Eyes: PERRLA, EOMs, conjunctiva no swelling or  erythema Sinuses: No Frontal/maxillary tenderness ENT/Mouth: Ext aud canals clear, TMs without erythema, bulging. No erythema, swelling, or exudate on post pharynx.  Tonsils not swollen or erythematous. Hearing normal.  Neck: Supple, thyroid normal.  Respiratory: Respiratory effort normal, BS equal bilaterally without rales, rhonchi, wheezing or stridor.  Cardio: RRR with no MRGs. Brisk peripheral pulses without edema.  Abdomen: Soft, + BS.  No deep palpation done. Well healing port sites x 4 without erythema or discharge, with liquid adhesive and steri strips, ecchymosis.  Lymphatics: Non tender without lymphadenopathy.  Musculoskeletal: Full ROM, 4/5 strength throughout, tremor with action throughout.  Skin: Warm/dry, intact except healing surgical port sites to abdomen as per above Neuro: Cranial nerves intact. Normal muscle tone, no cerebellar symptoms, rapid alternating, heel down shin, finger nose finger, pronator drift negative. Sensation intact.  Psych: Awake and oriented X 3, normal affect, Insight and Judgment appropriate.     Izora Ribas, NP 12:52 PM [facility]

## 2021-04-18 ENCOUNTER — Encounter: Payer: Self-pay | Admitting: Adult Health

## 2021-04-18 ENCOUNTER — Ambulatory Visit (INDEPENDENT_AMBULATORY_CARE_PROVIDER_SITE_OTHER): Payer: Medicare Other | Admitting: Adult Health

## 2021-04-18 ENCOUNTER — Other Ambulatory Visit: Payer: Self-pay

## 2021-04-18 VITALS — BP 122/64 | HR 74 | Temp 97.9°F | Wt 177.0 lb

## 2021-04-18 DIAGNOSIS — Z9049 Acquired absence of other specified parts of digestive tract: Secondary | ICD-10-CM

## 2021-04-18 DIAGNOSIS — E1122 Type 2 diabetes mellitus with diabetic chronic kidney disease: Secondary | ICD-10-CM | POA: Diagnosis not present

## 2021-04-18 DIAGNOSIS — I480 Paroxysmal atrial fibrillation: Secondary | ICD-10-CM | POA: Diagnosis not present

## 2021-04-18 DIAGNOSIS — I77819 Aortic ectasia, unspecified site: Secondary | ICD-10-CM

## 2021-04-18 DIAGNOSIS — I10 Essential (primary) hypertension: Secondary | ICD-10-CM | POA: Diagnosis not present

## 2021-04-18 DIAGNOSIS — R2681 Unsteadiness on feet: Secondary | ICD-10-CM

## 2021-04-18 DIAGNOSIS — N182 Chronic kidney disease, stage 2 (mild): Secondary | ICD-10-CM

## 2021-04-18 DIAGNOSIS — R319 Hematuria, unspecified: Secondary | ICD-10-CM

## 2021-04-19 ENCOUNTER — Other Ambulatory Visit: Payer: Self-pay | Admitting: Adult Health

## 2021-04-19 MED ORDER — MAGNESIUM 250 MG PO TABS: 1.0000 | ORAL_TABLET | Freq: Every day | ORAL | 0 refills | Status: DC

## 2021-04-20 ENCOUNTER — Other Ambulatory Visit: Payer: Self-pay | Admitting: Adult Health

## 2021-04-20 DIAGNOSIS — Z7901 Long term (current) use of anticoagulants: Secondary | ICD-10-CM

## 2021-04-20 DIAGNOSIS — I480 Paroxysmal atrial fibrillation: Secondary | ICD-10-CM

## 2021-04-20 DIAGNOSIS — N390 Urinary tract infection, site not specified: Secondary | ICD-10-CM

## 2021-04-20 LAB — BASIC METABOLIC PANEL WITH GFR
BUN: 22 mg/dL (ref 7–25)
CO2: 27 mmol/L (ref 20–32)
Calcium: 10 mg/dL (ref 8.6–10.3)
Chloride: 101 mmol/L (ref 98–110)
Creat: 1.1 mg/dL (ref 0.70–1.28)
Glucose, Bld: 121 mg/dL — ABNORMAL HIGH (ref 65–99)
Potassium: 4.3 mmol/L (ref 3.5–5.3)
Sodium: 138 mmol/L (ref 135–146)
eGFR: 71 mL/min/{1.73_m2} (ref >=60)

## 2021-04-20 LAB — CBC WITH DIFFERENTIAL/PLATELET
Absolute Monocytes: 647 cells/uL (ref 200–950)
Basophils Absolute: 66 cells/uL (ref 0–200)
Basophils Relative: 0.8 %
Eosinophils Absolute: 315 cells/uL (ref 15–500)
Eosinophils Relative: 3.8 %
HCT: 40.2 % (ref 38.5–50.0)
Hemoglobin: 13.6 g/dL (ref 13.2–17.1)
Lymphs Abs: 730 cells/uL — ABNORMAL LOW (ref 850–3900)
MCH: 31.6 pg (ref 27.0–33.0)
MCHC: 33.8 g/dL (ref 32.0–36.0)
MCV: 93.5 fL (ref 80.0–100.0)
MPV: 9.8 fL (ref 7.5–12.5)
Monocytes Relative: 7.8 %
Neutro Abs: 6540 cells/uL (ref 1500–7800)
Neutrophils Relative %: 78.8 %
Platelets: 275 10*3/uL (ref 140–400)
RBC: 4.3 10*6/uL (ref 4.20–5.80)
RDW: 12.3 % (ref 11.0–15.0)
Total Lymphocyte: 8.8 %
WBC: 8.3 10*3/uL (ref 3.8–10.8)

## 2021-04-20 LAB — URINE CULTURE
MICRO NUMBER:: 12857892
SPECIMEN QUALITY:: ADEQUATE

## 2021-04-20 LAB — URINALYSIS, ROUTINE W REFLEX MICROSCOPIC
Bilirubin Urine: NEGATIVE
Glucose, UA: NEGATIVE
Hgb urine dipstick: NEGATIVE
Ketones, ur: NEGATIVE
Leukocytes,Ua: NEGATIVE
Nitrite: NEGATIVE
Protein, ur: NEGATIVE
Specific Gravity, Urine: 1.014 (ref 1.001–1.035)
pH: 5.5 (ref 5.0–8.0)

## 2021-04-20 LAB — MAGNESIUM: Magnesium: 1.4 mg/dL — ABNORMAL LOW (ref 1.5–2.5)

## 2021-04-20 LAB — PROTIME-INR
INR: 2.4 — ABNORMAL HIGH
Prothrombin Time: 23 s — ABNORMAL HIGH (ref 9.0–11.5)

## 2021-04-20 MED ORDER — CEFUROXIME AXETIL 250 MG PO TABS: 250.0000 mg | ORAL_TABLET | Freq: Two times a day (BID) | ORAL | 0 refills | Status: DC

## 2021-04-25 ENCOUNTER — Ambulatory Visit: Payer: Medicare Other | Admitting: Adult Health

## 2021-04-26 ENCOUNTER — Ambulatory Visit (INDEPENDENT_AMBULATORY_CARE_PROVIDER_SITE_OTHER): Payer: Medicare Other

## 2021-04-26 ENCOUNTER — Other Ambulatory Visit: Payer: Self-pay

## 2021-04-26 DIAGNOSIS — Z7901 Long term (current) use of anticoagulants: Secondary | ICD-10-CM

## 2021-04-26 DIAGNOSIS — I480 Paroxysmal atrial fibrillation: Secondary | ICD-10-CM | POA: Diagnosis not present

## 2021-04-26 NOTE — Progress Notes (Signed)
Patient presents today for a Nurse Visit to recheck INR. Has been taking 2.5mg  of Warfarin daily since he has been on his antibiotic for UTI per Caryl Pina. Patient said he has had no new symptoms from the UTI and is feeling good.

## 2021-04-27 ENCOUNTER — Other Ambulatory Visit: Payer: Self-pay | Admitting: Adult Health

## 2021-04-27 ENCOUNTER — Encounter: Payer: Self-pay | Admitting: Adult Health

## 2021-04-27 DIAGNOSIS — M25512 Pain in left shoulder: Secondary | ICD-10-CM

## 2021-04-27 DIAGNOSIS — R5381 Other malaise: Secondary | ICD-10-CM

## 2021-04-27 LAB — PROTIME-INR
INR: 2.5 — ABNORMAL HIGH
Prothrombin Time: 23.6 s — ABNORMAL HIGH (ref 9.0–11.5)

## 2021-05-01 ENCOUNTER — Other Ambulatory Visit: Payer: Self-pay | Admitting: Adult Health

## 2021-05-01 ENCOUNTER — Encounter: Payer: Self-pay | Admitting: Adult Health

## 2021-05-01 DIAGNOSIS — N401 Enlarged prostate with lower urinary tract symptoms: Secondary | ICD-10-CM

## 2021-05-01 MED ORDER — FINASTERIDE 5 MG PO TABS: ORAL_TABLET | ORAL | 3 refills | Status: DC

## 2021-05-08 ENCOUNTER — Encounter: Payer: Medicare Other | Admitting: Internal Medicine

## 2021-05-16 ENCOUNTER — Encounter: Payer: Medicare Other | Admitting: Adult Health

## 2021-05-28 ENCOUNTER — Encounter: Payer: Self-pay | Admitting: Cardiovascular Disease

## 2021-05-28 NOTE — Progress Notes (Addendum)
Cardiology Office Note:    Date:  05/29/2021   ID:  Robert Arnold, DOB 1946/03/28, MRN [id number]  PCP:  [name], MD  Cardiologist:  Mertie Moores, MD  Electrophysiologist:  None   Referring MD: [name], MD   Chief Complaint  Patient presents with   Atrial Fibrillation         Problem list 1.  Coronary artery disease-status post stenting ( September 26, 2011 and coronary artery bypass grafting -  ~ Oct. ,  2013  2.  Paroxysmal atrial fibrillation 3.  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 5.  Hyperlipidemia    Jan. 8 2019   Robert Arnold is a 76 y.o. male with a hx of AV block . We are asked to see him today by Dr. Janit Pagan for further evaluation of this variable heart block.  He has a history of coronary artery disease.  The day following his kidney transplant he had a heart attack.  He had a stent placed and then subsequently had coronary artery bypass grafting.  He developed atrial fibrillation following his bypass grafting and continues to have paroxysmal atrial fibrillation.  He was started on Coumadin.  He has had his INR levels measured at Dr. Titus Mould office.   Hx of kidney transplant 6 years ago.  Also has a history of insulin-dependent diabetes mellitus. He typically exercises quite a bit.  He is not exercised in the past several weeks because of the recent this recent finding of variable heart block. He was seen by Dr. Janit Pagan and the EKG suggested that he might have trifascicular heart block.  Denies any episodes of syncope or presyncope.  Feb. 4, 2022: Robert Arnold is seen today for follow up of his AV block , CAD Has some left rib pain , Not exertional , not associated with eating or drinking  Usually resolves with tylenol    Feb. 20, 2023 Robert Arnold is seen today for follow up of his AV block, CAD  Was hospitalized in Jan  with chest   Still has this L rib pain .  Worse with palpitation  Might last a day  Not associated with exercise   Echo recently showed an EF of 45-50%.   Mild - mod AI, mild AS Mild aortic dilatation (23mm)   Past Medical History:  Diagnosis Date   Adenomatous colon polyp    Allergy    BPH (benign prostatic hyperplasia)    CAD (coronary artery disease)    a. NSTEMI 09/2011 s/p PCI to LAD. b. CABG 01/2012 @ [facility].   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   Essential hypertension    FHx: heart disease 02/27/2018   Glaucoma    Gout    Histoplasmosis    on itraconazole for prophylaxis   Ischemic cardiomyopathy    Mild aortic stenosis    Mild dilation of ascending aorta (HCC)    NSTEMI (non-ST elevated myocardial infarction) ([place]) 11/26/2013   2013    OSA (obstructive sleep apnea)    PAF (paroxysmal atrial fibrillation) ([place])    Testosterone deficiency 02/27/2018   Trifascicular block     Past Surgical History:  Procedure Laterality Date   ANTERIOR CERVICAL DECOMP/DISCECTOMY FUSION N/A 05/04/2018   Procedure: Cervical Five-Six Anterior  cervical decompression/discectomy/fusion;  Surgeon: Jovita Gamma, MD;  Location: [facility];  Service: Neurosurgery;  Laterality: N/A;  Cervical Five-Six Anterior cervical decompression/discectomy/fusion   AV FISTULA PLACEMENT  2011   Left forearm   COLONOSCOPY     CORONARY ANGIOPLASTY WITH STENT PLACEMENT     CORONARY ARTERY BYPASS GRAFT  2013   KIDNEY TRANSPLANT  09/25/2011   LAPAROSCOPIC APPENDECTOMY N/A 04/06/2021   Procedure: APPENDECTOMY LAPAROSCOPIC;  Surgeon: Rolm Bookbinder, MD;  Location: [facility];  Service: General;  Laterality: N/A;   PERITONEAL CATHETER INSERTION  2011    Current Medications: Current Meds  Medication Sig   allopurinol (ZYLOPRIM) 300 MG tablet Take 1/2 tablet Daily  to Prevent  Gout   aspirin EC 81 MG tablet Take  81 mg by mouth daily.   buPROPion (WELLBUTRIN XL) 300 MG 24 hr tablet Take  1 tablet  Daily for Mood, Focus & Concentration   finasteride (PROSCAR) 5 MG tablet Take  1 tablet  Daily  for Prostate   gabapentin (NEURONTIN) 300 MG capsule Take 1 capsule (300 mg total) by mouth at bedtime.   glucose blood (FREESTYLE TEST STRIPS) test strip Check blood sugar 3 to 4 times daily for medication regulation.   insulin NPH-regular Human (NOVOLIN 70/30) (70-30) 100 UNIT/ML injection Inject 12-22 Units into the skin in the morning and at bedtime. 22 units in the AM, 12-15 units in the PM.   latanoprost (XALATAN) 0.005 % ophthalmic solution Place 1 drop into both eyes at bedtime.    Magnesium 250 MG TABS Take 1 tablet (250 mg total) by mouth daily with supper.   mycophenolate (MYFORTIC) 360 MG TBEC EC tablet TAKE 1 TABLET BY MOUTH TWICE DAILY   sacubitril-valsartan (ENTRESTO) 24-26 MG Take 1 tablet by mouth 2 (two) times daily.   tacrolimus (PROGRAF) 0.5 MG capsule Take 0.5 mg by mouth 2 (two) times daily. With 1mg  capsule for total of 1.5mg  twice daily   tacrolimus (PROGRAF) 1 MG capsule Take 1 mg by mouth 2 (two) times daily. With  0.5g capsule for total of 1.5mg  twice daily   traZODone (DESYREL) 50 MG tablet Take  1 tablet  1 hour  before Bedtime  as needed for Sleep   warfarin (COUMADIN) 5 MG tablet Take 1 to 2 tablets  /day as directed to Prevent Blood Clots                                   /         TAKE ONE TO TWO TABLETS BY MOUTH     Allergies:   Losartan, Crestor [rosuvastatin], Lorazepam, and Morphine and related   Social History   Socioeconomic History   Marital status: Married    Spouse name: Not on file   Number of children: 1   Years of education: Not on file   Highest education level: Not on file  Occupational History   Occupation: retired  Tobacco Use   Smoking status: Former    Packs/day: 2.00    Years: 24.00    Pack years: 48.00    Types: Cigarettes    Start date: 1968    Quit  date: 11/29/1990    Years since quitting: 30.5   Smokeless tobacco: Never  Vaping Use   Vaping Use: Never used  Substance and Sexual Activity   Alcohol use: Yes    Comment: 1-2 a month   Drug use: No  Sexual activity: Not on file  Other Topics Concern   Not on file  Social History Narrative   Not on file   Social Determinants of Health   Financial Resource Strain: Not on file  Food Insecurity: Not on file  Transportation Needs: Not on file  Physical Activity: Not on file  Stress: Not on file  Social Connections: Not on file     Family History: The patient's family history includes Alcohol abuse in his brother and maternal grandfather; Alcoholism in his brother; Alzheimer's disease in his brother; COPD in his brother and mother; Diabetes in his mother; Heart attack in his father; Kidney disease in his sister. There is no history of Colon cancer, Stomach cancer, Rectal cancer, Esophageal cancer, Liver cancer, or Pancreatic cancer.  ROS:   Please see the history of present illness.     All other systems reviewed and are negative.  EKGs/Labs/Other Studies Reviewed:    The following studies were reviewed today:     Recent Labs: 03/20/2021: TSH 1.72 04/03/2021: ALT 27 04/18/2021: BUN 22; Creat 1.10; Hemoglobin 13.6; Magnesium 1.4; Platelets 275; Potassium 4.3; Sodium 138  Recent Lipid Panel    Component Value Date/Time   CHOL 118 03/20/2021 0905   CHOL 158 03/15/2013 0744   TRIG 56 03/20/2021 0905   HDL 60 03/20/2021 0905   HDL 61 03/15/2013 0744   CHOLHDL 2.0 03/20/2021 0905   VLDL 17 09/09/2016 1042   LDLCALC 44 03/20/2021 0905    Physical Exam:    Physical Exam: Blood pressure 136/70, pulse 90, height 5\' 8"  (1.727 m), weight 180 lb 3.2 oz (81.7 kg), SpO2 97 %.  GEN:  Well nourished, well developed in no acute distress HEENT: Normal NECK: No JVD; No carotid bruits LYMPHATICS: No lymphadenopathy CARDIAC: RRR , no murmurs, rubs, gallops RESPIRATORY:  Clear  to auscultation without rales, wheezing or rhonchi  ABDOMEN: Soft, non-tender, non-distended MUSCULOSKELETAL:  No edema; No deformity  SKIN: Warm and dry NEUROLOGIC:  Alert and oriented x 3     EKG:       ASSESSMENT:    1. Coronary artery disease involving coronary bypass graft of native heart with other forms of angina pectoris ([place])   2. Chronic systolic heart failure (HCC)    PLAN:      First-degree AV block:    with RBBB and LAHB.   Will avoid AV nodal slowing meds ( ie Coreg) for now.  He has not had syncope    2.  Coronary disease:   he has been here episodes of left-sided chest pain.  He is not sure if these feel like his previous episodes of pain.  When he was hospitalized for his appendicitis he was noted to have positive troponin levels.  I suspect that this was due to demand ischemia. But given the fact that he has mildly reduced left ventricular systolic function, I think that we should perform a Lexiscan Myoview study to rule out significant areas of ischemia.  We considered doing a coronary CT angiogram but given the fact that he has old bypass grafts I think that a CT scan might be too difficult to interpret.  3.  Chronic combined CHF: Recent echocardiogram revealed an EF of 45 to 50%.  He has not not had any recent episodes of chest discomfort.    We had originally recommended that he start Entresto.  The Entresto costs $800 a month.  Instead we will start him on Diovan 80 mg a day.  The fact that he has right bundle branch block, left anterior fascicular block and a first-degree AV block we will avoid carvedilol or other beta-blockers for now.   4.  Hyperlipidemia: Stable.  5.  Paroxysmal atrial fibrillation:    .  No recurrent episodes of atrial fibrillation.  Medication Adjustments/Labs and Tests Ordered: Current medicines are reviewed at length with the patient today.  Concerns regarding medicines are outlined above.  Orders Placed This Encounter   Procedures   Basic metabolic panel   MYOCARDIAL PERFUSION IMAGING   Meds ordered this encounter  Medications   sacubitril-valsartan (ENTRESTO) 24-26 MG    Sig: Take 1 tablet by mouth 2 (two) times daily.    Dispense:  180 tablet    Refill:  3    Patient Instructions  Medication Instructions:  Your physician has recommended you make the following change in your medication: 1) START taking Entresto 24-26 mg twice daily   *If you need a refill on your cardiac medications before your next appointment, please call your pharmacy*  Lab Work: IN 3 WEEKS: BMET If you have labs (blood work) drawn today and your tests are completely normal, you will receive your results only by: [place] (if you have MyChart) OR A paper copy in the mail If you have any lab test that is abnormal or we need to change your treatment, we will call you to review the results.  Testing/Procedures: Your physician has requested that you have a lexiscan myoview. For further information please visit [email or web address]. Please follow instruction sheet, as given.  Follow-Up: At [facility], you and your health needs are our priority.  As part of our continuing mission to provide you with exceptional heart care, we have created designated Provider Care Teams.  These Care Teams include your primary Cardiologist (physician) and Advanced Practice Providers (APPs -  Physician Assistants and Nurse Practitioners) who all work together to provide you with the care you need, when you need it.  Your next appointment:   6 month(s)  The format for your next appointment:   In Person  Provider:   Robbie Lis, PA-C, Nicholes Rough, PA-C, Dayna Dunn, PA-C, Ermalinda Barrios, PA-C, Christen Bame, NP, or Richardson Dopp, PA-C    If primary card or EP is not listed click here to update    :1}     Signed, Mertie Moores, MD  05/29/2021 11:41 AM    [place]

## 2021-05-29 ENCOUNTER — Other Ambulatory Visit: Payer: Self-pay

## 2021-05-29 ENCOUNTER — Encounter: Payer: Self-pay | Admitting: Cardiovascular Disease

## 2021-05-29 ENCOUNTER — Ambulatory Visit (INDEPENDENT_AMBULATORY_CARE_PROVIDER_SITE_OTHER): Payer: Medicare Other | Admitting: Cardiovascular Disease

## 2021-05-29 VITALS — BP 136/70 | HR 90 | Ht 68.0 in | Wt 180.2 lb

## 2021-05-29 DIAGNOSIS — I25708 Atherosclerosis of coronary artery bypass graft(s), unspecified, with other forms of angina pectoris: Secondary | ICD-10-CM | POA: Diagnosis not present

## 2021-05-29 DIAGNOSIS — I5022 Chronic systolic (congestive) heart failure: Secondary | ICD-10-CM

## 2021-05-29 MED ORDER — VALSARTAN 80 MG PO TABS: 80.0000 mg | ORAL_TABLET | Freq: Every day | ORAL | 3 refills | Status: DC

## 2021-05-29 MED ORDER — ENTRESTO 24-26 MG PO TABS: 1.0000 | ORAL_TABLET | Freq: Two times a day (BID) | ORAL | 3 refills | Status: DC

## 2021-05-29 NOTE — Addendum Note (Signed)
Addended by: Antonieta Iba on: 05/29/2021 01:12 PM   Modules accepted: Orders

## 2021-05-29 NOTE — Telephone Encounter (Signed)
Pt states Robert Arnold is too expensive for him and he cannot afford it. Provided pt with the assistance foundation number and will route message to [facility] as well. Will send to Nahser as FYI with the understanding that this medication is likely what the patient needs and theres not an equal alternative.

## 2021-05-29 NOTE — Patient Instructions (Signed)
Medication Instructions:  Your physician has recommended you make the following change in your medication: 1) START taking Entresto 24-26 mg twice daily   *If you need a refill on your cardiac medications before your next appointment, please call your pharmacy*  Lab Work: IN 3 WEEKS: BMET If you have labs (blood work) drawn today and your tests are completely normal, you will receive your results only by: [place] (if you have MyChart) OR A paper copy in the mail If you have any lab test that is abnormal or we need to change your treatment, we will call you to review the results.  Testing/Procedures: Your physician has requested that you have a lexiscan myoview. For further information please visit [email or web address]. Please follow instruction sheet, as given.  Follow-Up: At [facility], you and your health needs are our priority.  As part of our continuing mission to provide you with exceptional heart care, we have created designated Provider Care Teams.  These Care Teams include your primary Cardiologist (physician) and Advanced Practice Providers (APPs -  Physician Assistants and Nurse Practitioners) who all work together to provide you with the care you need, when you need it.  Your next appointment:   6 month(s)  The format for your next appointment:   In Person  Provider:   Robbie Lis, PA-C, Nicholes Rough, PA-C, Dayna Dunn, PA-C, Ermalinda Barrios, PA-C, Christen Bame, NP, or Richardson Dopp, PA-C    If primary card or EP is not listed click here to update    :1}

## 2021-05-29 NOTE — Telephone Encounter (Signed)
Nahser, Wonda Cheng, MD  Cira Servant, Judson Roch K Please DC entresto  Start Valsartan 80 mg a day .  The other orders will remain ( bmp in several weeks)   PN   Order for Entresto d/c'd at this time and sent Valsartan 80mg  daily into pharmacy. Sent pt message via MyChart.

## 2021-05-29 NOTE — Addendum Note (Signed)
Addended by: Thayer Headings on: 05/29/2021 03:53 PM   Modules accepted: Orders

## 2021-05-30 ENCOUNTER — Other Ambulatory Visit: Payer: Self-pay | Admitting: Cardiovascular Disease

## 2021-05-30 ENCOUNTER — Telehealth (HOSPITAL_COMMUNITY): Payer: Self-pay | Admitting: *Deleted

## 2021-05-30 DIAGNOSIS — R079 Chest pain, unspecified: Secondary | ICD-10-CM

## 2021-05-30 NOTE — Telephone Encounter (Signed)
Left message on voicemail per DPR in reference to upcoming appointment scheduled on  06/06/21 with detailed instructions given per Myocardial Perfusion Study Information Sheet for the test. LM to arrive 15 minutes early, and that it is imperative to arrive on time for appointment to keep from having the test rescheduled. If you need to cancel or reschedule your appointment, please call the office within 24 hours of your appointment. Failure to do so may result in a cancellation of your appointment, and a $50 no show fee. Phone number given for call back for any questions. Mckensey [place]hu[place] Jacqueline ° ° °

## 2021-06-03 ENCOUNTER — Other Ambulatory Visit: Payer: Self-pay | Admitting: Internal Medicine

## 2021-06-03 NOTE — Progress Notes (Addendum)
Future Appointments  Date Time Provider Department  06/04/2021  9:30 AM [name], MD [facility]  07/04/2021  9:00 AM Liane Comber, NP [facility]    History of Present Illness:       This very nice 76 y.o.  MWM presents for 3 month follow up with HTN, ASHD/MI (2013) s/pCABG, cAfib, HLD, ESRD s/p Renal Transplant (2013) and Vitamin D Deficiency. Patient is on low dose Allopurinol for his Gout.        Today, patient is c/o chronic R hip pains and also worsening pains of his Rt knee exacerbated by walking  & associated with crunching / popping sounds.        Patient is treated for HTN .  After his Kidney Transplant in 2013, he had a po acute MI with PCA/Stenting. He also had po Afib and is on coumadin . Todays BP is at goal -  130/70. Patient has had no complaints of any cardiac type chest pain, palpitations, dyspnea / orthopnea / PND, dizziness, claudication, or dependent edema. I Dec 30,2022, patient underwent appendectomy.        Hyperlipidemia is controlled with diet & meds. Patient denies myalgias or other med SEs. 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  Patient has had no symptoms of reactive hypoglycemia, diabetic polys, paresthesias or visual blurring.  Last A1c was not at goal :  Lab Results  Component Value Date   HGBA1C 6.1 (H) 03/20/2021        Further, the patient also has history of Vitamin D Deficiency ("24" /2014) and supplements vitamin D without any suspected side-effects. Last vitamin D was at goal :   Lab Results  Component Value Date   VD25OH 73 07/19/2019     Current Outpatient Medications on File Prior to Visit  Medication Sig   allopurinol 300 MG tablet Take  1/2 tablet Daily  to Prevent  Gout   aspirin EC 81 MG tablet Take daily.   buPROPion-XL 300 MG  Take  1 tablet  Daily    finasteride 5 MG tablet Take  1 tablet  Daily  for Prostate   gabapentin 300 MG capsule Take 1 capsule  at bedtime.   NOVOLIN 70/30 Inject 22 u AM and  12-15 u PM.   XALATAN 0.005 % ophth soln Place 1 drop into both eyes at bedtime.    Magnesium 250 MG TABS Take 1 tablet  daily with supper.   MYFORTIC) 360 MG T TAKE 1 TABLET TWICE DAILY   PROGRAF 0.5 MG capsule Take  2 times daily. With 1mg  capsule for total of 1.5mg  twice daily   PROGRAF 1 MG capsule Take 12 times daily. With  0.5g capsule for total of 1.5mg  twice daily   traZODone 50 MG tablet Take  1 tablet  1 hour  before Bedtime  as needed    valsartan  80 MG tablet Take 1 tablet (80 mg total) by mouth daily.   Warfarin /COUMADIN 5 MG tablet Take 1 to 2 tablets  /day as directed     Allergies  Allergen Reactions   Losartan Anaphylaxis   Crestor [Rosuvastatin] Other (See Comments)  Elevated LFT's   Lorazepam     Pt is unsure of reaction    Morphine And Related Other (See Comments)    Has no effect on pt      PMHx:   Past Medical History:  Diagnosis Date   Adenomatous colon polyp    Allergy    BPH (benign prostatic hyperplasia)    CAD (coronary artery disease)    a. NSTEMI 09/2011 s/p PCI to LAD. b. CABG 01/2012 @ [facility].   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   Essential hypertension    FHx: heart disease 02/27/2018   Glaucoma    Gout    Histoplasmosis    on itraconazole for prophylaxis   Ischemic cardiomyopathy    Mild aortic stenosis    Mild dilation of ascending aorta (HCC)    NSTEMI (non-ST elevated myocardial infarction) ([place]) 11/26/2013   2013    OSA (obstructive sleep apnea)    PAF (paroxysmal atrial fibrillation) ([place])    Testosterone deficiency 02/27/2018   Trifascicular block       Immunization History  Administered Date(s) Administered   DT  06/05/2015   Hepatitis B 04/08/2009   Influenza, High Dose  01/02/2018, 12/21/2018, 01/31/2020, 01/15/2021   Influenza 12/25/2016   PFIZER SARS-COV-2 Vacc 07/24/2019, 08/17/2019   Pneumococcal -13 10/25/2013   Pneumococcal -23 06/05/2015, 11/30/2020   Pneumococcal - 23 04/08/2008   Tdap 04/08/2008   Zoster Recombinat (Shingrix) 02/12/2018, 06/02/2018     Past Surgical History:  Procedure Laterality Date   ANTERIOR CERVICAL DECOMP/DISCECTOMY FUSION N/A 05/04/2018   Procedure: Cervical Five-Six Anterior cervical decompression/discectomy/fusion;  Jovita Gamma, MD   AV FISTULA PLACEMENT  2011   Left forearm   COLONOSCOPY     CORONARY ANGIOPLASTY WITH STENT PLACEMENT     CORONARY ARTERY BYPASS GRAFT  2013   KIDNEY TRANSPLANT  09/25/2011   LAPAROSCOPIC APPENDECTOMY N/A 04/06/2021   Procedure: APPENDECTOMY LAPAROSCOPIC;  Rolm Bookbinder, MD   PERITONEAL CATHETER INSERTION  2011    FHx:    Reviewed / unchanged  SHx:    Reviewed / unchanged   Systems Review:  Constitutional: Denies fever, chills, wt changes, headaches, insomnia, fatigue, night sweats, change in appetite. Eyes: Denies redness, blurred vision, diplopia, discharge, itchy, watery eyes.  ENT: Denies discharge, congestion, post nasal drip, epistaxis, sore throat, earache, hearing loss, dental pain, tinnitus, vertigo, sinus pain, snoring.  CV: Denies chest pain, palpitations, irregular heartbeat, syncope, dyspnea, diaphoresis, orthopnea, PND, claudication or edema. Respiratory: denies cough, dyspnea, DOE, pleurisy, hoarseness, laryngitis, wheezing.  Gastrointestinal: Denies dysphagia, odynophagia, heartburn, reflux, water brash, abdominal pain or cramps, nausea, vomiting, bloating, diarrhea, constipation, hematemesis, melena, hematochezia  or hemorrhoids. Genitourinary: Denies dysuria, frequency, urgency, nocturia, hesitancy, discharge, hematuria or  flank pain. Musculoskeletal: Denies arthralgias, myalgias, stiffness, jt. swelling, pain, limping or strain/sprain.  Skin: Denies pruritus, rash, hives, warts, acne, eczema or change in skin lesion(s). Neuro: No weakness, tremor, incoordination, spasms, paresthesia or pain. Psychiatric: Denies confusion, memory loss or sensory loss. Endo: Denies change in weight, skin or hair change.  Heme/Lymph: No excessive bleeding, bruising or enlarged lymph nodes.  Physical Exam  BP 130/70    Pulse 74    Temp 97.9 F (36.6 C)    Resp 16    Ht 5\' 8"  (1.727 m)    Wt 179 lb 3.2 oz (81.3 kg)    SpO2 97%  BMI 27.25 kg/m   Appears  well nourished, well groomed  and in no distress.  Eyes: PERRLA, EOMs, conjunctiva no swelling or erythema. Sinuses: No frontal/maxillary tenderness ENT/Mouth: EAC's clear, TM's nl w/o erythema, bulging. Nares clear w/o erythema, swelling, exudates. Oropharynx clear without erythema or exudates. Oral hygiene is good. Tongue normal, non obstructing. Hearing intact.  Neck: Supple. Thyroid not palpable. Car 2+/2+ without bruits, nodes or JVD. Chest: Respirations nl with BS clear & equal w/o rales, rhonchi, wheezing or stridor.  Cor: Heart sounds normal w/ sl irregular rate and rhythm without sig. Murmurs.  Peripheral pulses normal and equal  without edema.  Abdomen: Soft & bowel sounds normal. Non-tender w/o guarding, rebound, hernias, masses or organomegaly.  Lymphatics: Unremarkable.  Musculoskeletal: Full ROM all peripheral extremities, joint stability, 5/5 strength and normal gait.  Tender about Rt hip bursa & noted crepitus of the Right knee.  Skin: Warm, dry without exposed rashes or lesions with few ecchymoses over dorsal forearms. ecchymosis  Neuro: Cranial nerves intact, reflexes equal bilaterally. Sensory-motor testing grossly intact. Tendon reflexes grossly intact.  Pysch: Alert & oriented x 3.  Insight and judgement nl & appropriate. No ideations.  Assessment and  Plan:  1. Essential hypertension  - Continue medication, monitor blood pressure at home.  - Continue DASH diet.  Reminder to go to the ER if any CP,  SOB, nausea, dizziness, severe HA, changes vision/speech.   - CBC with Differential/Platelet - COMPLETE METABOLIC PANEL WITH GFR - Magnesium - TSH  2. Hyperlipidemia associated with type 2 diabetes mellitus ([place])  - Continue diet/meds, exercise,& lifestyle modifications.  - Continue monitor periodic cholesterol/liver & renal functions    - TSH  3. Type 2 diabetes mellitus with stage 2 chronic kidney disease, with long-term current use of insulin (HCC)  - Continue diet, exercise  - Lifestyle modifications.  - Monitor appropriate labs   - Fructosamine  4. Chronic atrial fibrillation (HCC)  - Protime-INR  - TSH  5. Vitamin D deficiency  - Continue supplementation   - VITAMIN D 25 Hydroxy   6. Idiopathic gout  - Uric acid  7. Anticoagulant long-term use  - Protime-INR  8. Medication management  - CBC with Differential/Platelet - COMPLETE METABOLIC PANEL WITH GFR - Magnesium - TSH - Fructosamine - VITAMIN D 25 Hydroxy  - Uric acid          Discussed  regular exercise, BP monitoring, weight control to achieve/maintain BMI less than 25 and discussed med and SE's. Recommended labs to assess and monitor clinical status with further disposition pending results of labs.  I discussed the assessment and treatment plan with the patient. The patient was provided an opportunity to ask questions and all were answered. The patient agreed with the plan and demonstrated an understanding of the instructions.  I provided over 30 minutes of exam, counseling, chart review and  complex critical decision making.        The patient was advised to call back or seek an in-person evaluation if the symptoms worsen or if the condition fails to improve as anticipated.   Kirtland Bouchard, MD

## 2021-06-03 NOTE — Patient Instructions (Signed)
Due to recent changes in healthcare laws, you may see the results of your imaging and laboratory studies on MyChart before your provider has had a chance to review them.  We understand that in some cases there may be results that are confusing or concerning to you. Not all laboratory results come back in the same time frame and the provider may be waiting for multiple results in order to interpret others.  Please give [place] 48 hours in order for your provider to thoroughly review all the results before contacting the office for clarification of your results.  ++++++++++++++++++++++++++++++++++ Warfarin Information  Warfarin is a blood thinner (anticoagulant). Anticoagulants help to prevent the formation of blood clots or keep them from getting bigger. Your health care provider will monitor the anticoagulation effect of warfarin closely and will adjust your medicine as needed. Who should use warfarin? Warfarin is prescribed for people who have blood clots, or who are at risk for developing harmful blood clots, such as people who: Have mechanical heart valves. Have irregular heart rhythms (atrial fibrillation). Have certain clotting disorders. Have had blood clots in the past or are currently receiving treatment for them. This includes people who have had a stroke, blood clots in the lungs (pulmonary embolism, or PE), or blood clots in the legs (deep vein thrombosis,or DVT). How is warfarin taken? Warfarin is taken by mouth (orally). Warfarin tablets come in different strengths. The strength is printed on the tablet, and each strength is a different color. If you get a new prescription and the color of your tablet is different than usual, tell your pharmacist or health care provider immediately. Take warfarin exactly as told by your health care provider, at the same time every day. Doing this helps you avoid bleeding or blood clots that could result in serious injury, pain, or disability. Contact your  health care provider if a dose is forgotten or missed. Do not change or take additional dosesto make up for missed or accidental extra doses. What blood tests do I need while taking warfarin? Warfarin is a medicine that needs to be closely monitored with blood tests. It is very important to keep all lab visits and follow-up visits with your health care provider. These tests measure the blood's ability to clot and are called prothrombin tests (PT)or international normalized ratio (INR) tests. These tests can be done with a finger stick or a blood draw. What does the INR test result mean? The PT test results will be reported as the INR. Your health care provider will tell you your target INR range. If your INR is not in your target range, your health care provider may adjust your dosage. If your INR is above your target range, there is a risk of bleeding. Your dosage of warfarin may need to be decreased. If your INR is below your target range, there is a risk of clotting. Your dosage of warfarin may need to be increased. How often is the INR test needed? When you first start warfarin, you will usually have your INR checked every few days until the health care provider determines the correct dosage of warfarin. After you have reached your target INR, your INR will be tested less often. However, you will need to have your INR checked at least once every 4-6 weeks while you take warfarin. Some people may be able to use home monitoring to check their INR. Ask your health care provider if this applies to you. What are the side effects of warfarin?  Too much warfarin can cause bleeding or hemorrhage in any part of the body, such as: Bleeding from the gums. Unexplained bruises or bruises that get larger. A nosebleed that is not easily stopped. Bleeding in the brain (hemorrhagic stroke). Coughing up or vomiting blood. Blood in the urine or stools. Warfarin may also cause: Skin rash or irritations. Nausea  that does not go away. Severe pain in the back or joints. Painful toes that turn blue or purple (purple toe syndrome). Painful ulcers that do not go away (skin necrosis). What precautions do I need to take while using warfarin? Wear a medical alert bracelet or carry a card that lists what medicines you take. Make sure that all health care providers, including your dentist, know you are taking warfarin. Avoid situations that cause bleeding by: Using a softer toothbrush. Flossing with waxed floss. Shaving with an Copy, not with a blade. Limiting your use of sharp objects. Avoiding activities that put you at risk for injury, such as contact sports. What do I need to know about warfarin and pregnancy or breastfeeding? If you are taking warfarin and you become pregnant, or plan to become pregnant, contact your health care provider right away. Though warfarin has been associated with birth defects, it can be used in some cases after weighing risks to mother and baby. If you plan to breastfeed while taking warfarin, talk with your health care provider first. What do I need to know about warfarin and alcohol or drug use? Do not drink alcohol if: Your health care provider tells you not to drink. You are pregnant, may be pregnant, or are planning to become pregnant. If you drink alcohol: Limit how much you have to: 0-1 drink a day for women. 0-2 drinks a day for men. Know how much alcohol is in your drink. In the [place] one drink equals one 12 oz bottle of beer (355 mL), one 5 oz glass of wine (148 mL), or one 1 oz glass of hard liquor (44 mL). If you change the amount of alcohol that you drink, tell your health care provider. Your warfarin dosage may need to be changed. Do not use any products that contain nicotine or tobacco. These products include cigarettes, chewing tobacco, and vaping devices, such as e-cigarettes. If you need help quitting, ask your health care provider. If you use  nicotine or tobacco products and change the amount that you use, tell your health care provider. Your warfarin dosage may need to be changed. Avoid drug use while taking warfarin. The effects of drugs on warfarin are not known. What do I need to know about warfarin and other medicines or supplements? Many prescription and over-the-counter medicines can interfere with warfarin. Talk with your health care provider or your pharmacist before starting or stopping any new medicines. This includes vitamins, herbs, supplements, and pain medicines. Some common over-the-counter medicines that may increase the risk of dangerous bleeding while taking warfarin include: Aspirin. NSAIDs, such as ibuprofen or naproxen. Vitamin E. Fish oils. What do I need to know about warfarin and my diet? Vitamin K decreases the effect of warfarin, and it is found in many foods. Eat a consistent amount of foods that contain vitamin K. For example, you may decide to eat 2 servings of vitamin K-containing foods each day. It is important to maintain a normal, balanced diet while taking warfarin. Avoid major changes in your diet. If you are going to change your diet, talk with your health care provider before making  changes. Your health care provider may recommend that you work with a dietitian. Contact a health care provider if you: Miss a dose. Take an extra dose. Plan to have any kind of surgery or procedure. Ask whether you should stop taking warfarin or change your dose before your surgery. Are unable to take your medicine due to nausea, vomiting, or diarrhea. Have any major changes in your diet, or you plan to make major changes in your diet. Start or stop any over-the-counter medicine, prescription medicine, herbal supplement, or dietary supplement. Become pregnant, plan to become pregnant, or think you may be pregnant. Have menstrual periods that are heavier than usual. Have unusual bruising. Get help right away if  you: Have signs of an allergic reaction, such as: Swelling of the lips, face, tongue, mouth, or throat. Rash or itchy, red, swollen areas of skin (hives). Trouble breathing. Chest tightness. Fall or have an accident, especially if you hit your head. Have signs that your blood is too thin, such as: Blood in your urine. Your urine may look reddish, pinkish, or tea-colored. Blood in your stool. Your stool may be black or bright red. Coughing up or vomiting blood. The blood may be bright red, or it may look like coffee grounds. Bleeding that does not stop after applying pressure to the area for 30 minutes. Have signs of a blood clot in your leg or arm, such as: Pain or swelling in your leg or arm. Skin that is red or warm to the touch on your arm or leg. Have signs of blood in your lung, such as: Shortness of breath or difficulty breathing. Chest pain. Unexplained fever. Have any symptoms of a stroke. "BE FAST" is an easy way to remember the main warning signs of a stroke: B - Balance. Signs are dizziness, sudden trouble walking, or loss of balance. E - Eyes. Signs are trouble seeing or a sudden change in vision. F - Face. Signs are sudden weakness or numbness of the face, or the face or eyelid drooping on one side. A - Arms. Signs are weakness or numbness in an arm. This happens suddenly and usually on one side of the body. S - Speech. Signs are sudden trouble speaking, slurred speech, or trouble understanding what people say. T - Time. Time to call emergency services. Write down what time symptoms started. Have other signs of a stroke, such as: A sudden, severe headache with no known cause. Nausea or vomiting. Seizure. Have other signs of a reaction to warfarin, such as: Purple or blue toes. Skin ulcers that do not go away. These symptoms may represent a serious problem that is an emergency. Do not wait to see if the symptoms will go away. Get medical help right away. Call your local  emergency services (911 in the [place] Do not drive yourself to the hospital. Summary Warfarin is a medicine that thins blood. It is used to prevent or treat blood clots. You must be monitored closely while on this medicine. Keep all follow-up visits. Make sure that you know your target INR range and your warfarin dosage. Wear or carry identification that says you are taking warfarin. Take warfarin at the same time every day. Call your health care provider if you miss a dose or if you take an extra dose. Do not change the dosage of warfarin on your own. Know the signs and symptoms of blood clots, bleeding, and a stroke. Know when to get emergency medical   ++++++++++++++++++++++++++++++++++  Vit D  &  Vit C 1,000 mg   are recommended to help protect  against the Covid-19 and other Corona viruses.    Also it's recommended  to take  Zinc 50 mg  to help  protect against the Covid-19   and best place to get  is also on [email or web address]  and don't pay more than 6-8 cents /pill !   ===================================== Coronavirus (COVID-19) Are you at risk?  Are you at risk for the Coronavirus (COVID-19)?  To be considered HIGH RISK for Coronavirus (COVID-19), you have to meet the following criteria:  Traveled to [place], [place], [place], [place] or [place]; or in the [place] to [place], [place], [place]  or [place]; and have fever, cough, and shortness of breath within the last 2 weeks of travel OR Been in close contact with a person diagnosed with COVID-19 within the last 2 weeks and have  fever, cough,and shortness of breath  IF YOU DO NOT MEET THESE CRITERIA, YOU ARE CONSIDERED LOW RISK FOR COVID-19.  What to do if you are HIGH RISK for COVID-19?  If you are having a medical emergency, call 911. Seek medical care right away. Before you go to a doctors office, urgent care or emergency department,  call ahead and tell them about your recent travel, contact with someone  diagnosed with COVID-19   and your symptoms.  You should receive instructions from your physicians office regarding next steps of care.  When you arrive at healthcare provider, tell the healthcare staff immediately you have returned from  visiting [place], [place], [place], [place] or [place]; or traveled in the [place] to [place], [place],  [place] or [place] in the last two weeks or you have been in close contact with a person diagnosed with  COVID-19 in the last 2 weeks.   Tell the health care staff about your symptoms: fever, cough and shortness of breath. After you have been seen by a medical provider, you will be either: Tested for (COVID-19) and discharged home on quarantine except to seek medical care if  symptoms worsen, and asked to  Stay home and avoid contact with others until you get your results (4-5 days)  Avoid travel on public transportation if possible (such as bus, train, or airplane) or Sent to the Emergency Department by EMS for evaluation, COVID-19 testing  and  possible admission depending on your condition and test results.  What to do if you are LOW RISK for COVID-19?  Reduce your risk of any infection by using the same precautions used for avoiding the common cold or flu:  Wash your hands often with soap and warm water for at least 20 seconds.  If soap and water are not readily available,  use an alcohol-based hand sanitizer with at least 60% alcohol.  If coughing or sneezing, cover your mouth and nose by coughing or sneezing into the elbow areas of your shirt or coat,  into a tissue or into your sleeve (not your hands). Avoid shaking hands with others and consider head nods or verbal greetings only. Avoid touching your eyes, nose, or mouth with unwashed hands.  Avoid close contact with people who are sick. Avoid places or events with large numbers of people in one location, like concerts or sporting events. Carefully consider travel plans you have or  are making. If you are planning any travel outside or inside the [place], visit the [place] webpage for the latest health notices. If you have  some symptoms but not all symptoms, continue to monitor at home and seek medical attention  if your symptoms worsen. If you are having a medical emergency, call 911.   ++++++++++++++++++++++++++++++++ Recommend Adult Low Dose Aspirin or  coated  Aspirin 81 mg daily  To reduce risk of Colon Cancer 40 %,  Skin Cancer 26 % ,  Melanoma 46%  and  Pancreatic cancer 60% ++++++++++++++++++++++++++++++++ Vitamin D goal  is between 70-100.  Please make sure that you are taking your Vitamin D as directed.  It is very important as a natural anti-inflammatory  helping hair, skin, and nails, as well as reducing stroke and heart attack risk.  It helps your bones and helps with mood. It also decreases numerous cancer risks so please take it as directed.  Low Vit D is associated with a 200-300% higher risk for CANCER  and 200-300% higher risk for HEART   ATTACK  &  STROKE.   .....................................Marland Kitchen It is also associated with higher death rate at younger ages,  autoimmune diseases like Rheumatoid arthritis, Lupus, Multiple Sclerosis.    Also many other serious conditions, like depression, Alzheimer's Dementia, infertility, muscle aches, fatigue, fibromyalgia - just to name a few. ++++++++++++++++++++ Recommend the book "The END of DIETING" by Dr Excell Seltzer  & the book "The END of DIABETES " by Dr Excell Seltzer At [email or web address] - get book & Audio CD's    Being diabetic has a  300% increased risk for heart attack, stroke, cancer, and alzheimer- type vascular dementia. It is very important that you work harder with diet by avoiding all foods that are white. Avoid white rice (brown & wild rice is OK), white potatoes (sweetpotatoes in moderation is OK), White bread or wheat bread or anything made out of white flour like bagels, donuts,  rolls, buns, biscuits, cakes, pastries, cookies, pizza crust, and pasta (made from white flour & egg whites) - vegetarian pasta or spinach or wheat pasta is OK. Multigrain breads like Arnold's or Pepper[employer], or multigrain sandwich thins or flatbreads.  Diet, exercise and weight loss can reverse and cure diabetes in the early stages.  Diet, exercise and weight loss is very important in the control and prevention of complications of diabetes which affects every system in your body, ie. Brain - dementia/stroke, eyes - glaucoma/blindness, heart - heart attack/heart failure, kidneys - dialysis, stomach - gastric paralysis, intestines - malabsorption, nerves - severe painful neuritis, circulation - gangrene & loss of a leg(s), and finally cancer and Alzheimers.    I recommend avoid fried & greasy foods,  sweets/candy, white rice (brown or wild rice or Quinoa is OK), white potatoes (sweet potatoes are OK) - anything made from white flour - bagels, doughnuts, rolls, buns, biscuits,white and wheat breads, pizza crust and traditional pasta made of white flour & egg white(vegetarian pasta or spinach or wheat pasta is OK).  Multi-grain bread is OK - like multi-grain flat bread or sandwich thins. Avoid alcohol in excess. Exercise is also important.    Eat all the vegetables you want - avoid meat, especially red meat and dairy - especially cheese.  Cheese is the most concentrated form of trans-fats which is the worst thing to clog up our arteries. Veggie cheese is OK which can be found in the fresh produce section at [employer] or [employer] or [employer]  +++++++++++++++++++++ DASH Eating Plan  DASH stands for "Dietary Approaches to Stop Hypertension."   The DASH eating plan is a healthy eating plan that  has been shown to reduce high blood pressure (hypertension). Additional health benefits may include reducing the risk of type 2 diabetes mellitus, heart disease, and stroke. The DASH eating plan may also help  with weight loss. WHAT DO I NEED TO KNOW ABOUT THE DASH EATING PLAN? For the DASH eating plan, you will follow these general guidelines: Choose foods with a percent daily value for sodium of less than 5% (as listed on the food label). Use salt-free seasonings or herbs instead of table salt or sea salt. Check with your health care provider or pharmacist before using salt substitutes. Eat lower-sodium products, often labeled as "lower sodium" or "no salt added." Eat fresh foods. Eat more vegetables, fruits, and low-fat dairy products. Choose whole grains. Look for the word "whole" as the first word in the ingredient list. Choose fish  Limit sweets, desserts, sugars, and sugary drinks. Choose heart-healthy fats. Eat veggie cheese  Eat more home-cooked food and less restaurant, buffet, and fast food. Limit fried foods. Cook foods using methods other than frying. Limit canned vegetables. If you do use them, rinse them well to decrease the sodium. When eating at a restaurant, ask that your food be prepared with less salt, or no salt if possible.                      WHAT FOODS CAN I EAT? Read Dr Fara Olden Fuhrman's books on The End of Dieting & The End of Diabetes  Grains Whole grain or whole wheat bread. Brown rice. Whole grain or whole wheat pasta. Quinoa, bulgur, and whole grain cereals. Low-sodium cereals. Corn or whole wheat flour tortillas. Whole grain cornbread. Whole grain crackers. Low-sodium crackers.  Vegetables Fresh or frozen vegetables (raw, steamed, roasted, or grilled). Low-sodium or reduced-sodium tomato and vegetable juices. Low-sodium or reduced-sodium tomato sauce and paste. Low-sodium or reduced-sodium canned vegetables.   Fruits All fresh, canned (in natural juice), or frozen fruits.  Protein Products  All fish and seafood.  Dried beans, peas, or lentils. Unsalted nuts and seeds. Unsalted canned beans.  Dairy Low-fat dairy products, such as skim or 1% milk, 2% or  reduced-fat cheeses, low-fat ricotta or cottage cheese, or plain low-fat yogurt. Low-sodium or reduced-sodium cheeses.  Fats and Oils Tub margarines without trans fats. Light or reduced-fat mayonnaise and salad dressings (reduced sodium). Avocado. Safflower, olive, or canola oils. Natural peanut or almond butter.  Other Unsalted popcorn and pretzels. The items listed above may not be a complete list of recommended foods or beverages. Contact your dietitian for more options.  +++++++++++++++  WHAT FOODS ARE NOT RECOMMENDED? Grains/ White flour or wheat flour White bread. White pasta. White rice. Refined cornbread. Bagels and croissants. Crackers that contain trans fat.  Vegetables  Creamed or fried vegetables. Vegetables in a . Regular canned vegetables. Regular canned tomato sauce and paste. Regular tomato and vegetable juices.  Fruits Dried fruits. Canned fruit in light or heavy syrup. Fruit juice.  Meat and Other Protein Products Meat in general - RED meat & White meat.  Fatty cuts of meat. Ribs, chicken wings, all processed meats as bacon, sausage, bologna, salami, fatback, hot dogs, bratwurst and packaged luncheon meats.  Dairy Whole or 2% milk, cream, half-and-half, and cream cheese. Whole-fat or sweetened yogurt. Full-fat cheeses or blue cheese. Non-dairy creamers and whipped toppings. Processed cheese, cheese spreads, or cheese curds.  Condiments Onion and garlic salt, seasoned salt, table salt, and sea salt. Canned and packaged gravies. Worcestershire sauce. Tartar sauce.  Barbecue sauce. Teriyaki sauce. Soy sauce, including reduced sodium. Steak sauce. Fish sauce. Oyster sauce. Cocktail sauce. Horseradish. Ketchup and mustard. Meat flavorings and tenderizers. Bouillon cubes. Hot sauce. Tabasco sauce. Marinades. Taco seasonings. Relishes.  Fats and Oils Butter, stick margarine, lard, shortening and bacon fat. Coconut, palm kernel, or palm oils. Regular salad  dressings.  Pickles and olives. Salted popcorn and pretzels.  The items listed above may not be a complete list of foods and beverages to avoid.

## 2021-06-04 ENCOUNTER — Ambulatory Visit (INDEPENDENT_AMBULATORY_CARE_PROVIDER_SITE_OTHER): Payer: Medicare Other | Admitting: Internal Medicine

## 2021-06-04 ENCOUNTER — Other Ambulatory Visit: Payer: Self-pay

## 2021-06-04 ENCOUNTER — Encounter: Payer: Self-pay | Admitting: Internal Medicine

## 2021-06-04 VITALS — BP 130/70 | HR 74 | Temp 97.9°F | Resp 16 | Ht 68.0 in | Wt 179.2 lb

## 2021-06-04 DIAGNOSIS — N182 Chronic kidney disease, stage 2 (mild): Secondary | ICD-10-CM

## 2021-06-04 DIAGNOSIS — E559 Vitamin D deficiency, unspecified: Secondary | ICD-10-CM

## 2021-06-04 DIAGNOSIS — G8929 Other chronic pain: Secondary | ICD-10-CM

## 2021-06-04 DIAGNOSIS — I10 Essential (primary) hypertension: Secondary | ICD-10-CM | POA: Diagnosis not present

## 2021-06-04 DIAGNOSIS — E785 Hyperlipidemia, unspecified: Secondary | ICD-10-CM

## 2021-06-04 DIAGNOSIS — E1169 Type 2 diabetes mellitus with other specified complication: Secondary | ICD-10-CM

## 2021-06-04 DIAGNOSIS — Z79899 Other long term (current) drug therapy: Secondary | ICD-10-CM

## 2021-06-04 DIAGNOSIS — M25561 Pain in right knee: Secondary | ICD-10-CM

## 2021-06-04 DIAGNOSIS — D692 Other nonthrombocytopenic purpura: Secondary | ICD-10-CM

## 2021-06-04 DIAGNOSIS — I482 Chronic atrial fibrillation, unspecified: Secondary | ICD-10-CM

## 2021-06-04 DIAGNOSIS — Z7901 Long term (current) use of anticoagulants: Secondary | ICD-10-CM

## 2021-06-04 DIAGNOSIS — Z794 Long term (current) use of insulin: Secondary | ICD-10-CM

## 2021-06-04 DIAGNOSIS — E1122 Type 2 diabetes mellitus with diabetic chronic kidney disease: Secondary | ICD-10-CM | POA: Diagnosis not present

## 2021-06-04 DIAGNOSIS — M25551 Pain in right hip: Secondary | ICD-10-CM

## 2021-06-04 DIAGNOSIS — M1000 Idiopathic gout, unspecified site: Secondary | ICD-10-CM

## 2021-06-04 DIAGNOSIS — L309 Dermatitis, unspecified: Secondary | ICD-10-CM

## 2021-06-04 MED ORDER — TRIAMCINOLONE ACETONIDE 0.1 % EX CREA: 1.0000 "application " | TOPICAL_CREAM | Freq: Two times a day (BID) | CUTANEOUS | 3 refills | Status: DC

## 2021-06-05 NOTE — Progress Notes (Signed)
=============================================================== °-   Test results slightly outside the reference range are not unusual. If there is anything important, I will review this with you,  otherwise it is considered normal test values.  If you have further questions,  please do not hesitate to contact me at the office or via My Chart.  =============================================================== ===============================================================  -  ProTime / INR = 2.3 X    - Great ,  Please keep coumadin same  =============================================================== ===============================================================  -  Uric Acid / Gout test - Normal ( & low ) - Great - continue Allopurinol same  =============================================================== ===============================================================  -   Magnesium  -   1.5   -  very  low- goal is betw 2.0 - 2.5,   - So..............Marland Kitchen  Recommend that you take  Magnesium 500 mg tablet daily   - also important to eat lots of  leafy green vegetables   - spinach - Kale - collards - greens - okra - asparagus  - broccoli - quinoa - squash - almonds   - black, red, white beans  -  peas - green beans =============================================================== ===============================================================  -  Vitamin D = 75 - Excellent - Please keep dose same  =============================================================== ===============================================================  -  All Else - CBC - Kidneys - Electrolytes - Liver - Magnesium & Thyroid    - all  Normal / OK =============================================================== ===============================================================

## 2021-06-06 ENCOUNTER — Ambulatory Visit (HOSPITAL_COMMUNITY): Payer: Medicare Other | Attending: Cardiovascular Disease

## 2021-06-06 ENCOUNTER — Other Ambulatory Visit: Payer: Self-pay

## 2021-06-06 DIAGNOSIS — I25708 Atherosclerosis of coronary artery bypass graft(s), unspecified, with other forms of angina pectoris: Secondary | ICD-10-CM | POA: Insufficient documentation

## 2021-06-06 DIAGNOSIS — I5022 Chronic systolic (congestive) heart failure: Secondary | ICD-10-CM | POA: Insufficient documentation

## 2021-06-06 LAB — MYOCARDIAL PERFUSION IMAGING
LV dias vol: 207 mL (ref 62–150)
LV sys vol: 128 mL
Nuc Stress EF: 38 %
Peak HR: 82 {beats}/min
Rest HR: 61 {beats}/min
Rest Nuclear Isotope Dose: 10.5 mCi
SDS: 8
SRS: 15
SSS: 23
ST Depression (mm): 0 mm
Stress Nuclear Isotope Dose: 30.5 mCi
TID: 0.97

## 2021-06-06 MED ORDER — REGADENOSON 0.4 MG/5ML IV SOLN
0.4000 mg | Freq: Once | INTRAVENOUS | Status: AC
  Administered 2021-06-06: 0.4 mg via INTRAVENOUS

## 2021-06-06 MED ORDER — TECHNETIUM TC 99M TETROFOSMIN IV KIT
10.5000 | PACK | Freq: Once | INTRAVENOUS | Status: AC | PRN
  Administered 2021-06-06: 10.5 via INTRAVENOUS
  Filled 2021-06-06: qty 11

## 2021-06-06 MED ORDER — TECHNETIUM TC 99M TETROFOSMIN IV KIT
30.5000 | PACK | Freq: Once | INTRAVENOUS | Status: AC | PRN
  Administered 2021-06-06: 30.5 via INTRAVENOUS
  Filled 2021-06-06: qty 31

## 2021-06-08 LAB — COMPLETE METABOLIC PANEL WITH GFR
AG Ratio: 1.8 (calc) (ref 1.0–2.5)
ALT: 20 U/L (ref 9–46)
AST: 22 U/L (ref 10–35)
Albumin: 3.9 g/dL (ref 3.6–5.1)
Alkaline phosphatase (APISO): 92 U/L (ref 35–144)
BUN: 18 mg/dL (ref 7–25)
CO2: 27 mmol/L (ref 20–32)
Calcium: 9.9 mg/dL (ref 8.6–10.3)
Chloride: 104 mmol/L (ref 98–110)
Creat: 0.99 mg/dL (ref 0.70–1.28)
Globulin: 2.2 g/dL (calc) (ref 1.9–3.7)
Glucose, Bld: 166 mg/dL — ABNORMAL HIGH (ref 65–99)
Potassium: 4.4 mmol/L (ref 3.5–5.3)
Sodium: 140 mmol/L (ref 135–146)
Total Bilirubin: 1.6 mg/dL — ABNORMAL HIGH (ref 0.2–1.2)
Total Protein: 6.1 g/dL (ref 6.1–8.1)
eGFR: 80 mL/min/{1.73_m2} (ref >=60)

## 2021-06-08 LAB — CBC WITH DIFFERENTIAL/PLATELET
Absolute Monocytes: 353 cells/uL (ref 200–950)
Basophils Absolute: 39 cells/uL (ref 0–200)
Basophils Relative: 0.8 %
Eosinophils Absolute: 39 cells/uL (ref 15–500)
Eosinophils Relative: 0.8 %
HCT: 41.9 % (ref 38.5–50.0)
Hemoglobin: 14.1 g/dL (ref 13.2–17.1)
Lymphs Abs: 809 cells/uL — ABNORMAL LOW (ref 850–3900)
MCH: 32 pg (ref 27.0–33.0)
MCHC: 33.7 g/dL (ref 32.0–36.0)
MCV: 95.2 fL (ref 80.0–100.0)
MPV: 9.9 fL (ref 7.5–12.5)
Monocytes Relative: 7.2 %
Neutro Abs: 3660 cells/uL (ref 1500–7800)
Neutrophils Relative %: 74.7 %
Platelets: 140 10*3/uL (ref 140–400)
RBC: 4.4 10*6/uL (ref 4.20–5.80)
RDW: 12.7 % (ref 11.0–15.0)
Total Lymphocyte: 16.5 %
WBC: 4.9 10*3/uL (ref 3.8–10.8)

## 2021-06-08 LAB — PROTIME-INR
INR: 2.3 — ABNORMAL HIGH
Prothrombin Time: 22.2 s — ABNORMAL HIGH (ref 9.0–11.5)

## 2021-06-08 LAB — VITAMIN D 25 HYDROXY (VIT D DEFICIENCY, FRACTURES): Vit D, 25-Hydroxy: 75 ng/mL (ref 30–100)

## 2021-06-08 LAB — URIC ACID: Uric Acid, Serum: 3.4 mg/dL — ABNORMAL LOW (ref 4.0–8.0)

## 2021-06-08 LAB — FRUCTOSAMINE: Fructosamine: 248 umol/L (ref 205–285)

## 2021-06-08 LAB — TSH: TSH: 1.12 mIU/L (ref 0.40–4.50)

## 2021-06-08 LAB — MAGNESIUM: Magnesium: 1.5 mg/dL (ref 1.5–2.5)

## 2021-06-11 ENCOUNTER — Other Ambulatory Visit: Payer: Self-pay

## 2021-06-11 ENCOUNTER — Encounter: Payer: Self-pay | Admitting: Internal Medicine

## 2021-06-11 MED ORDER — GABAPENTIN 300 MG PO CAPS: ORAL_CAPSULE | ORAL | 1 refills | Status: DC

## 2021-06-11 MED ORDER — GABAPENTIN 300 MG PO CAPS: 300.0000 mg | ORAL_CAPSULE | Freq: Every day | ORAL | 1 refills | Status: DC

## 2021-06-19 ENCOUNTER — Other Ambulatory Visit: Payer: Medicare Other

## 2021-06-19 ENCOUNTER — Other Ambulatory Visit: Payer: Self-pay

## 2021-06-19 DIAGNOSIS — I5022 Chronic systolic (congestive) heart failure: Secondary | ICD-10-CM

## 2021-06-19 DIAGNOSIS — I25708 Atherosclerosis of coronary artery bypass graft(s), unspecified, with other forms of angina pectoris: Secondary | ICD-10-CM

## 2021-06-20 LAB — BASIC METABOLIC PANEL
BUN/Creatinine Ratio: 24 (ref 10–24)
BUN: 21 mg/dL (ref 8–27)
CO2: 25 mmol/L (ref 20–29)
Calcium: 9.5 mg/dL (ref 8.6–10.2)
Chloride: 104 mmol/L (ref 96–106)
Creatinine, Ser: 0.88 mg/dL (ref 0.76–1.27)
Glucose: 103 mg/dL — ABNORMAL HIGH (ref 70–99)
Potassium: 5 mmol/L (ref 3.5–5.2)
Sodium: 141 mmol/L (ref 134–144)
eGFR: 91 mL/min/{1.73_m2} (ref >59)

## 2021-07-03 ENCOUNTER — Other Ambulatory Visit: Payer: Self-pay | Admitting: Internal Medicine

## 2021-07-04 ENCOUNTER — Ambulatory Visit (INDEPENDENT_AMBULATORY_CARE_PROVIDER_SITE_OTHER): Payer: Medicare Other | Admitting: Adult Health

## 2021-07-04 ENCOUNTER — Other Ambulatory Visit: Payer: Self-pay

## 2021-07-04 ENCOUNTER — Encounter: Payer: Self-pay | Admitting: Adult Health

## 2021-07-04 VITALS — BP 122/70 | HR 78 | Temp 97.5°F | Ht 67.0 in | Wt 178.2 lb

## 2021-07-04 DIAGNOSIS — F325 Major depressive disorder, single episode, in full remission: Secondary | ICD-10-CM

## 2021-07-04 DIAGNOSIS — E538 Deficiency of other specified B group vitamins: Secondary | ICD-10-CM

## 2021-07-04 DIAGNOSIS — E1169 Type 2 diabetes mellitus with other specified complication: Secondary | ICD-10-CM

## 2021-07-04 DIAGNOSIS — D6859 Other primary thrombophilia: Secondary | ICD-10-CM

## 2021-07-04 DIAGNOSIS — E663 Overweight: Secondary | ICD-10-CM

## 2021-07-04 DIAGNOSIS — I770 Arteriovenous fistula, acquired: Secondary | ICD-10-CM

## 2021-07-04 DIAGNOSIS — M1000 Idiopathic gout, unspecified site: Secondary | ICD-10-CM

## 2021-07-04 DIAGNOSIS — D696 Thrombocytopenia, unspecified: Secondary | ICD-10-CM

## 2021-07-04 DIAGNOSIS — N138 Other obstructive and reflux uropathy: Secondary | ICD-10-CM

## 2021-07-04 DIAGNOSIS — I77819 Aortic ectasia, unspecified site: Secondary | ICD-10-CM

## 2021-07-04 DIAGNOSIS — E1165 Type 2 diabetes mellitus with hyperglycemia: Secondary | ICD-10-CM

## 2021-07-04 DIAGNOSIS — I5042 Chronic combined systolic (congestive) and diastolic (congestive) heart failure: Secondary | ICD-10-CM | POA: Diagnosis not present

## 2021-07-04 DIAGNOSIS — E1122 Type 2 diabetes mellitus with diabetic chronic kidney disease: Secondary | ICD-10-CM

## 2021-07-04 DIAGNOSIS — N401 Enlarged prostate with lower urinary tract symptoms: Secondary | ICD-10-CM

## 2021-07-04 DIAGNOSIS — I480 Paroxysmal atrial fibrillation: Secondary | ICD-10-CM

## 2021-07-04 DIAGNOSIS — I25810 Atherosclerosis of coronary artery bypass graft(s) without angina pectoris: Secondary | ICD-10-CM

## 2021-07-04 DIAGNOSIS — Z85828 Personal history of other malignant neoplasm of skin: Secondary | ICD-10-CM

## 2021-07-04 DIAGNOSIS — I10 Essential (primary) hypertension: Secondary | ICD-10-CM

## 2021-07-04 DIAGNOSIS — N028 Recurrent and persistent hematuria with other morphologic changes: Secondary | ICD-10-CM

## 2021-07-04 DIAGNOSIS — Z0001 Encounter for general adult medical examination with abnormal findings: Secondary | ICD-10-CM

## 2021-07-04 DIAGNOSIS — Z125 Encounter for screening for malignant neoplasm of prostate: Secondary | ICD-10-CM

## 2021-07-04 DIAGNOSIS — E559 Vitamin D deficiency, unspecified: Secondary | ICD-10-CM

## 2021-07-04 DIAGNOSIS — D692 Other nonthrombocytopenic purpura: Secondary | ICD-10-CM

## 2021-07-04 MED ORDER — ATORVASTATIN CALCIUM 40 MG PO TABS: ORAL_TABLET | ORAL | 3 refills | Status: DC

## 2021-07-04 MED ORDER — TAMSULOSIN HCL 0.4 MG PO CAPS: ORAL_CAPSULE | ORAL | 3 refills | Status: DC

## 2021-07-04 NOTE — Progress Notes (Signed)
? ?Encounter for Annual Physical Exam  ? ?Assessment:  ? ?Encounter for Annual Physical Exam with abnormal findings ?Due annually  ?Health Maintenance reviewed ?Healthy lifestyle reviewed and goals set ? ?Essential hypertension ?- continue medications, DASH diet, exercise and monitor at home. Call if greater than 130/80.  ? ?PAF (paroxysmal atrial fibrillation) ([place]) ?Check INR q month and will adjust medication according to labs.  ?Discussed if patient falls to immediately contact office or go to ER. Discussed foods that can increase or decrease Coumadin levels. Patient understands to call the office before starting a new medication. ?Follow up in one month.  ? ?Chronic combined heart failure ([place]) ?Mildly decreased EF per ECHO 04/2021; Dr. Acie Fredrickson following; newly on entresto ?Appears euvolemic;  ?Continue to monitor fluid intake, sodium; daily weights  ?Contact office or cardiologist with any changes ? ?Acquired dilation of ascending aorta and aortic root (HCC) ?Cardiology following; will need ECHO vs CVA in 1 year (Jan 2024) ?Go to the ER if any chest pain, shortness of breath, nausea, dizziness, severe HA, changes vision/speech ? ?Thrombocytopenia ([place]) ?Check CBC at routine visits ? ?Gout due to renal impairment, unspecified chronicity, unspecified site ? recheck Uric acid as needed, Diet discussed, continue medications. ? ?Vitamin D deficiency ?Continue supplement  ? ?Medication management ?Check routine labs  ? ?Long term current use of anticoagulant therapy ?Monitor INR ? ?Benign prostatic hyperplasia without lower urinary tract symptoms ?Increased LUTS; discussed and added tamsulosin 0.4 mg daily at night; follow up if not improving, plan urology referral  ?PSA check today ? ?Type 2 Diabetes Mellitus ([place]) ?Education: Reviewed ?ABCs? of diabetes management (respective goals in parentheses):  A1C (<7), blood pressure (<130/80), and cholesterol (LDL <70) ?Eye Exam yearly and Dental Exam every 6 months - UTD ,  report requested ?Dietary recommendations ?Physical Activity recommendations ? ?CKD stage 2 due to type 2 diabetes mellitus ([place]) ?Discussed general issues about diabetes pathophysiology and management., Educational material distributed., Suggested low cholesterol diet., Encouraged aerobic exercise., Discussed foot care.,  ? ?Hyperlipidemia associated with T2DM ([place]) ?Restarted atorvastatin ?LDL goal <70 ?Continue low cholesterol diet and exercise.  ?Check lipid panel.  ? ?Mild episode of recurrent major depressive disorder, in remission ([place]) ?Continue medications;  ?Lifestyle discussed: diet/exerise, sleep hygiene, stress management, hydration ? ? Overweight  ?- long discussion about weight loss, diet, and exercise ?-recommended diet heavy in fruits and veggies and low in animal meats, cheeses, and dairy products ? ?Chronic bilateral hip pain  ?Improved; monitoring ? ?Hx of renal transplant; AV fistula ([place]) ?S/p transplant in 2013, doing well since on on myfortic and prograf (follows with [facility]), Dr. Donnetta Hutching PRN for retained AV fistula  ? ?Senile purpura ([place]) ?R/t comadin, monitor, protect skin  ? ?History of skin cancer ?Unsure what type, non melanoma; protect skin, sunscreen encouraged; derm following  ? ? ?Orders Placed This Encounter  ?Procedures  ? CBC with Differential/Platelet  ? COMPLETE METABOLIC PANEL WITH GFR  ? Magnesium  ? Lipid panel  ? TSH  ? Hemoglobin A1c  ? Microalbumin / creatinine urine ratio  ? Urinalysis, Routine w reflex microscopic  ? PSA  ? Protime-INR  ? HM DIABETES FOOT EXAM  ? ? ?Over 30 minutes of exam, counseling, chart review, and critical decision making was performed ? ?Future Appointments  ?Date Time Provider [place]  ?08/06/2021  9:30 AM [name], MD [facility] None  ?09/06/2021 11:30 AM Liane Comber, NP [facility] None  ?10/10/2021 11:30 AM Liane Comber, NP [facility] None  ?  01/04/2022  9:00 AM Darrol Jump, NP [facility] None  ?04/05/2022  9:30 AM  Darrol Jump, NP [facility] None  ?07/05/2022  9:00 AM Liane Comber, NP [facility] None  ? ? ? ?Plan:  ? ?During the course of the visit the patient was educated and counseled about appropriate screening and preventive services including:  ? ?Pneumococcal vaccine  ?Influenza vaccine ?Prevnar 13 ?Td vaccine ?Screening electrocardiogram ?Colorectal cancer screening ?Diabetes screening ?Glaucoma screening ?Nutrition counseling  ? ? ?Subjective:  ?Robert Arnold. is a 76 y.o. male who presents for CPE and 1 month OV for coumadin.  He has Chronic combined systolic and diastolic CHF (congestive heart failure) ([place]); CKD stage 2 due to type 2 diabetes mellitus ([place]); Hyperlipidemia associated with type 2 diabetes mellitus ([place]); BPH with obstruction/lower urinary tract symptoms; Gout; A-fib ([place]); Essential hypertension; Vitamin D deficiency; Anticoagulant long-term use; Renal Transplant, s/p 09/2011; Depression, major, in remission ([place]); Thrombocytopenia ([place]); CAD (coronary artery disease) of artery bypass graft; IgA nephropathy; Essential tremor; Overweight (BMI 25.0-29.9); Senile purpura ([place]); Diabetes mellitus type 2, controlled ([place]); Former smoker (quit 1992); HNP (herniated nucleus pulposus), cervical; S/P cervical spinal fusion; Chronic hip pain, bilateral; Hypercoagulopathy ([place]); Mobitz I; A-V fistula ([place]); First degree AV block; History of skin cancer; B12 deficiency; Chest pain; and Acquired dilation of ascending aorta and aortic root ([place]) on their problem list. ? ?He is married, 1 adopted son, 1 grandson 2 great grandchildren in [place].  ?He is retired from [employer].  ? ?He had recent admission for acute appendicitis 04/03/2021 s/p appendectomy, denies concerns.  ? ?He underwent C6-7 disc decompression and fusion in 04/2018 by Dr. Durene Cal and feels fully resolved without any issues. He has chronic bilateral hip pain but improves with steroid injection, recently improved.  ? ?He has history of  depression, well controlled with wellbutrin 300 mg daily and trazodone 50 mg at night. He notes has had some more difficulty falling asleep, but does wake up feeling refreshed.  ? ?BMI is Body mass index is 27.91 kg/m?., he has been working on diet, admits exercise has been limited with weather. Does have a recumbent bike, would like to work up to 30 min 5 days a week. Walks in park when weather is good.  ?He drinks 1-2 cups of coffee. Minimal alochol. Drinks lots of water. Stress levels are low.  ?Wt Readings from Last 3 Encounters:  ?07/04/21 178 lb 3.2 oz (80.8 kg)  ?06/06/21 179 lb (81.2 kg)  ?06/04/21 179 lb 3.2 oz (81.3 kg)  ? ?He has p. A. Fib, on coumadin; hx of CAD s/p stenting 09/2011 and CABG 02/2012, has done well since. 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  ?He also had episode of angina recently with admission 03/2021 with benign workup excepting mild depression LVEF 45-50% with global hypokinesis, also ascending aorta and root dilation (Aneurysm of the ascending aorta, measuring 41 mm. There is mild dilatation of the aortic root, measuring 38 mm.) recommended for outpatient follow up. He had follow up with cardiolgy Dr. Acie Fredrickson and was initiated on entresto. He reports stable home weights.  ? ?He has history of Afib and is on coumadin, he is on 5 mg daily, within goal range; no nose bleeds, blood stool/urine. No missed doses, no ABX.  ?Lab Results  ?Component Value Date  ? INR 2.3 (H) 06/04/2021  ? INR 2.5 (H) 04/26/2021  ? INR 2.4 (H) 04/18/2021  ? ?His blood pressure has been controlled at home, today their BP is BP: 122/70 ?  He does workout, walking 4 days/week. He denies chest pain, shortness of breath, dizziness.  ? ? He is on cholesterol medication (atorvastatin 40 mg was held while in hospital, has restarted per patient) and denies myalgias. His cholesterol is at goal. The cholesterol last visit was:   ?Lab Results  ?Component Value Date  ? CHOL 118 03/20/2021  ? HDL 60 03/20/2021  ? LDLCALC 44  03/20/2021  ? TRIG 56 03/20/2021  ? CHOLHDL 2.0 03/20/2021  ? ? He has been working on diet and exercise for well controlled T2 diabetes on novolin 70/30 22 units AM, 12-15 units units at night, and denies incre

## 2021-07-05 ENCOUNTER — Encounter: Payer: Self-pay | Admitting: Adult Health

## 2021-07-05 ENCOUNTER — Other Ambulatory Visit: Payer: Self-pay | Admitting: Adult Health

## 2021-07-05 LAB — CBC WITH DIFFERENTIAL/PLATELET
Absolute Monocytes: 484 cells/uL (ref 200–950)
Basophils Absolute: 41 cells/uL (ref 0–200)
Basophils Relative: 0.7 %
Eosinophils Absolute: 89 cells/uL (ref 15–500)
Eosinophils Relative: 1.5 %
HCT: 42.7 % (ref 38.5–50.0)
Hemoglobin: 14.1 g/dL (ref 13.2–17.1)
Lymphs Abs: 1080 cells/uL (ref 850–3900)
MCH: 31.5 pg (ref 27.0–33.0)
MCHC: 33 g/dL (ref 32.0–36.0)
MCV: 95.3 fL (ref 80.0–100.0)
MPV: 9.9 fL (ref 7.5–12.5)
Monocytes Relative: 8.2 %
Neutro Abs: 4207 cells/uL (ref 1500–7800)
Neutrophils Relative %: 71.3 %
Platelets: 135 10*3/uL — ABNORMAL LOW (ref 140–400)
RBC: 4.48 10*6/uL (ref 4.20–5.80)
RDW: 12.6 % (ref 11.0–15.0)
Total Lymphocyte: 18.3 %
WBC: 5.9 10*3/uL (ref 3.8–10.8)

## 2021-07-05 LAB — COMPLETE METABOLIC PANEL WITH GFR
AG Ratio: 1.7 (calc) (ref 1.0–2.5)
ALT: 28 U/L (ref 9–46)
AST: 28 U/L (ref 10–35)
Albumin: 3.8 g/dL (ref 3.6–5.1)
Alkaline phosphatase (APISO): 114 U/L (ref 35–144)
BUN/Creatinine Ratio: 29 (calc) — ABNORMAL HIGH (ref 6–22)
BUN: 27 mg/dL — ABNORMAL HIGH (ref 7–25)
CO2: 26 mmol/L (ref 20–32)
Calcium: 9.4 mg/dL (ref 8.6–10.3)
Chloride: 103 mmol/L (ref 98–110)
Creat: 0.94 mg/dL (ref 0.70–1.28)
Globulin: 2.3 g/dL (calc) (ref 1.9–3.7)
Glucose, Bld: 83 mg/dL (ref 65–99)
Potassium: 4.6 mmol/L (ref 3.5–5.3)
Sodium: 139 mmol/L (ref 135–146)
Total Bilirubin: 1.1 mg/dL (ref 0.2–1.2)
Total Protein: 6.1 g/dL (ref 6.1–8.1)
eGFR: 86 mL/min/{1.73_m2} (ref >=60)

## 2021-07-05 LAB — PSA: PSA: <0.04 ng/mL (ref <=4.00)

## 2021-07-05 LAB — LIPID PANEL
Cholesterol: 123 mg/dL (ref <200)
HDL: 65 mg/dL (ref >=40)
LDL Cholesterol (Calc): 45 mg/dL (calc)
Non-HDL Cholesterol (Calc): 58 mg/dL (calc) (ref <130)
Total CHOL/HDL Ratio: 1.9 (calc) (ref <5.0)
Triglycerides: 44 mg/dL (ref <150)

## 2021-07-05 LAB — MICROALBUMIN / CREATININE URINE RATIO
Creatinine, Urine: 90 mg/dL (ref 20–320)
Microalb Creat Ratio: 3 mcg/mg creat (ref <30)
Microalb, Ur: 0.3 mg/dL

## 2021-07-05 LAB — PROTIME-INR
INR: 2.3 — ABNORMAL HIGH
Prothrombin Time: 22.4 s — ABNORMAL HIGH (ref 9.0–11.5)

## 2021-07-05 LAB — URINALYSIS, ROUTINE W REFLEX MICROSCOPIC
Bilirubin Urine: NEGATIVE
Glucose, UA: NEGATIVE
Hgb urine dipstick: NEGATIVE
Ketones, ur: NEGATIVE
Leukocytes,Ua: NEGATIVE
Nitrite: NEGATIVE
Protein, ur: NEGATIVE
Specific Gravity, Urine: 1.017 (ref 1.001–1.035)
pH: 5.5 (ref 5.0–8.0)

## 2021-07-05 LAB — TSH: TSH: 2.07 mIU/L (ref 0.40–4.50)

## 2021-07-05 LAB — MAGNESIUM: Magnesium: 1.9 mg/dL (ref 1.5–2.5)

## 2021-07-05 MED ORDER — MAGNESIUM 500 MG PO TABS
1.0000 | ORAL_TABLET | Freq: Every day | ORAL | Status: AC
Start: 2021-07-05 — End: ?

## 2021-07-24 ENCOUNTER — Other Ambulatory Visit: Payer: Self-pay | Admitting: Internal Medicine

## 2021-07-30 ENCOUNTER — Encounter: Payer: Self-pay | Admitting: Adult Health

## 2021-07-31 ENCOUNTER — Other Ambulatory Visit: Payer: Self-pay | Admitting: Adult Health

## 2021-07-31 DIAGNOSIS — M542 Cervicalgia: Secondary | ICD-10-CM

## 2021-07-31 DIAGNOSIS — R0789 Other chest pain: Secondary | ICD-10-CM

## 2021-07-31 MED ORDER — CYCLOBENZAPRINE HCL 10 MG PO TABS: 10.0000 mg | ORAL_TABLET | Freq: Three times a day (TID) | ORAL | 1 refills | Status: DC | PRN

## 2021-08-05 NOTE — Patient Instructions (Signed)
Due to recent changes in healthcare laws, you may see the results of your imaging and laboratory studies on MyChart before your provider has had a chance to review them.  We understand that in some cases there may be results that are confusing or concerning to you. Not all laboratory results come back in the same time frame and the provider may be waiting for multiple results in order to interpret others.  Please give [place] 48 hours in order for your provider to thoroughly review all the results before contacting the office for clarification of your results.  ?++++++++++++++++++++++++++++++++++ ? ?Warfarin Coagulopathy ? ?Warfarin coagulopathy refers to bleeding that may occur as a complication of the medicine warfarin. This can be life-threatening. Warfarin is a blood thinner (anticoagulant). Anticoagulants prevent dangerous blood clots. Bleeding is the most common and most serious complication of warfarin. ?While taking warfarin, you will need to have blood tests (prothrombin tests, or PT tests) regularly to measure your blood clotting time. The PT test results will be reported as the international normalized ratio (INR). The INR tells your health care provider whether your dosage of warfarin needs to be changed. The longer it takes your blood to clot, the higher the INR. Your risk of warfarin coagulopathy increases as your INR increases. ?What are the causes? ?This condition may be caused by: ?Taking too much warfarin (overdose). ?Underlying medical conditions. ?Dietary changes. ?Interactions with medicines, supplements, or alcohol. ?What are the signs or symptoms? ?Warfarin coagulopathy may cause bleeding from anywhere. Symptoms may include: ?Bleeding from the gums, or a nosebleed that is not easily stopped. ?Blood in your stool or urine. This may look like bright red, dark, or black, tarry stool, or pink, red, or brown urine. ?Unusual bruising or bruising easily, or a cut that does not stop bleeding within 10  minutes. ?Coughing up or vomiting blood. ?Feeling nauseous for longer than 1 day. ?Broken blood vessels in the eye. ?Abdominal or back pain with or without bruising. ?Unusual vaginal bleeding. ?Swelling or pain at an injection site. ?Skin scarring due to tissue death of fatty tissue. This may cause pain in the waist, thighs, or buttocks. This is more common among women. ?Symptoms may also include stroke signs, such as: ?Sudden, severe headache. ?Sudden weakness or numbness of the face, arm, or leg, especially on one side of the body. ?Sudden confusion. ?Difficulty speaking or understanding speech. ?Sudden trouble seeing out of one or both eyes. ?Unexpected difficulty walking. ?Dizziness. ?Loss of balance or coordination. ?How is this diagnosed? ?This condition is diagnosed after your health care provider places you on warfarin and then tests your blood's ability to clot. Prothrombin time (PT) clotting tests are used to monitor your clotting ability. ?How is this treated? ?If you have bleeding, you may be treated with vitamin K. Vitamin K helps the blood to clot. ?You may also receive donated plasma. Plasma is the liquid part of blood. It contains substances that help the blood clot. If you have life-threatening bleeding, you may be given other medicines through an IV. ?Follow these instructions at home: ?Medicines ?Take warfarin exactly as told by your health care provider, at the same time every day. Doing this helps you avoid bleeding or blood clots that could result in serious injury, pain, or disability. ?Contact your health care provider if a dose is forgotten or missed. Do not change or take additional doses to make up for missed or accidental extra doses. ?Many prescription, over-the-counter, and pain medicines, as well as vitamins, herbs,  and supplements can interfere with warfarin. Talk with your health care provider or pharmacist before starting or stopping any new medicines. Your warfarin dosage may need  to be adjusted. ?Eating and drinking ?It is important to maintain a normal, balanced diet while taking warfarin. Avoid major changes in your diet. If you are planning to change your diet, talk with your health care provider before making changes. Your health care provider may recommend that you work with a dietitian. ?Vitamin K makes warfarin less effective. It is found in many foods. Eat a consistent amount of foods that contain vitamin K. For example, you may decide to eat 2 vitamin K-containing foods each day. Eating the same amount each day enables your health care provider to set the correct dose of warfarin. ?Tests ? ?Make sure to have PT tests at least once every 4-6 weeks while you are taking warfarin. ?Ask your health care provider what your target INR range is. Make sure you always know your target range. If your INR is not in your target range, your health care provider may adjust your dosage. ?Preventing bleeding and injury ?Some common over-the-counter medicines and supplements may increase the risk of bleeding while taking warfarin, including: ?Aspirin. ?NSAIDs, such as ibuprofen or naproxen. ?Vitamin E. ?Fish oils. ?Avoid situations that cause bleeding by: ?Using a softer toothbrush. ?Flossing with waxed floss, not unwaxed floss. ?Shaving with an Copy, not with a blade. ?Limiting your use of sharp objects. ?Avoiding potentially harmful activities, such as contact sports. ?General instructions ?Wear a medical alert bracelet or carry a card that lists what medicines you take. ?Make sure that all health care providers, including your dentist, know that you are taking warfarin. ?If you plan to breastfeed or become pregnant while taking warfarin, talk with your health care provider. ?Avoid alcohol, drugs, and products that contain nicotine and tobacco. ?If you change the amount of nicotine, tobacco, or alcohol you use, tell your health care provider. ?Keep all follow-up visits, including visits  for lab tests. This is important. ?Contact a health care provider if: ?You miss a dose. ?You take an extra dose. ?You plan to have any kind of surgery or procedure. You may have to stop taking warfarin before your surgery. ?You are unable to take your medicine due to nausea, vomiting, or diarrhea. ?You have any major changes in your diet, or you plan to make major changes in your diet. ?You start or stop any over-the-counter medicine, prescription medicine, or dietary supplement. ?You become pregnant, plan to become pregnant, or think you may be pregnant. ?You have menstrual periods that are heavier than usual, or unusual vaginal bleeding. ?You have unusual bruising. ?You lose your appetite. ?You have a fever. ?You have diarrhea that lasts for more than 24 hours. ?Get help right away if: ?You develop symptoms of an allergic reaction, such as: ?Swelling of the lips, face, tongue, mouth, or throat. ?Rash. ?Itching. ?Itchy, red, swollen areas of skin (hives). ?Trouble breathing. ?Chest tightness. ?You have any symptoms of a stroke. "BE FAST" is an easy way to remember the main warning signs of a stroke: ?B - Balance. Signs are dizziness, sudden trouble walking, or loss of balance. ?E - Eyes. Signs are trouble seeing or a sudden change in vision. ?F - Face. Signs are sudden weakness or numbness of the face, or the face or eyelid drooping on one side. ?A - Arms. Signs are weakness or numbness in an arm. This happens suddenly and usually on one side of  the body. ?S - Speech. Signs are sudden trouble speaking, slurred speech, or trouble understanding what people say. ?T - Time. Time to call emergency services. Write down what time symptoms started. ?You have other signs of stroke, such as: ?A sudden, severe headache with no known cause. ?Nausea or vomiting. ?Seizure. ?You have signs or symptoms of a blood clot, such as: ?Pain or swelling in your leg or arm. ?Skin that is red or warm to the touch on your arm or  leg. ?Shortness of breath or difficulty breathing. ?Chest pain. ?Unexplained fever. ?You have: ?A fall or an accident, especially if you hit your head. ?Blood in your urine. Your urine may look reddish, pinkish, or t

## 2021-08-05 NOTE — Progress Notes (Signed)
? ?Future Appointments  ?Date Time Provider Department  ?08/06/2021  9:30 AM [name], MD [facility]  ?09/06/2021 11:30 AM T Hulan Saas [facility]  ?10/10/2021 11:30 AM Caralyn Guile  ?01/04/2022          Wellness  9:00 AM Darrol Jump, NP [facility]  ?04/05/2022  9:30 AM Darrol Jump, NP [facility]  ?07/05/2022  9:00 AM Liane Comber, NP [facility]  ? ? ?History of Present Illness: ? ? ?    This very nice 76 y.o. MWM presents for monthly Coumadin  / anticoag visit  & follow up  HTN, ASCAD s/p CABG,  HLD, T2_DM, s/p Renal Transplant (2013) , Gout  and Vitamin D Deficiency.  ? ? ?    Patient is treated for HTN  & BP has been controlled at home.  In 2013 after his Kidney Transplant he had an acute MI & also po pAfib.  He has been on Coumadin since.  Denies issues with excess bruising or bleeding. Today?s BP is at goal - 136/76. Patient has had no complaints of any cardiac type chest pain, palpitations, dyspnea / orthopnea / PND, dizziness, claudication, or dependent edema. ? ? ?    Hyperlipidemia is controlled with diet & meds. Patient denies myalgias or other med SE?s. Last Lipids were at goal : ? ?Lab Results  ?Component Value Date  ? CHOL 123 07/04/2021  ? HDL 65 07/04/2021  ? LDLCALC 45 07/04/2021  ? TRIG 44 07/04/2021  ? CHOLHDL 1.9 07/04/2021  ? ? ? ?Also, the patient has history of T2_DM (2003) and after his Kidney Transplant,  he was switched to Insulin in 2013.   He denies  symptoms of reactive hypoglycemia, diabetic polys, paresthesias or visual blurring.  Last A1c was still not at goal .  Fructosamine in Feb was 248 approximating an A1c 6.0% : ? ?Lab Results  ?Component Value Date  ? HGBA1C 6.1 (H) 03/20/2021  ? ?    ? ?                                                 Further, the patient also has history of Vitamin D Deficiency ("24" /2014)  and supplements Vitamin D . Last vitamin D was at goal : ? ?Lab Results  ?Component Value Date  ? VD25OH 75 06/04/2021  ? ? ? ?Current Outpatient  Medications on File Prior to Visit  ?Medication Sig  ? allopurinol 300 MG tablet Take 1/2 tablet Daily  to Prevent  Gout  ? aspirin EC 81 MG tablet Take 81 mg by mouth daily.  ? atorvastatin 40 MG tablet Take 1 tab daily for cholesterol.  ? buPROPion XL 300 MG 24 hr tablet Take  1 tablet  Daily for Mood, Focus & Concentration  ? cyclobenzaprine  10 MG tablet Take 1 tablet  3  times daily as needed f  ? finasteride 5 MG tablet Take  1 tablet  Daily  for Prostate  ? gabapentin 300 MG capsule Take one capsule three times daily  ? NOVOLIN 70/30 Usually  22 units in the AM, 12-15 units in the PM.  ? XALATAN 0.005 % ophth soln Place 1 drop into both eyes at bedtime.   ? Magnesium 500 MG TABS Take 1 tablet (500 mg total) by mouth daily.  ? mycophenolate (MYFORTIC) 360 MG  TAKE 1 TABLET BY  MOUTH TWICE DAILY  ? tacrolimus (PROGRAF) 0.5 MG capsule Take 0.5 mg by mouth 2 (two) times daily. With '1mg'$  capsule for total of 1.'5mg'$  twice daily  ? tacrolimus (PROGRAF) 1 MG capsule Take 1 mg by mouth 2 (two) times daily. With  0.5g capsule for total of 1.'5mg'$  twice daily  ? Tamsulosin  0.4 MG CAPS capsule Take 1 cap daily at night   ? traZODone 50 MG tablet TAKE 1 tab 1 hr  BEFORE BEDTIME   ? triamcinolone cream  0.1 % Apply  to rash  2 x /day  ? valsartan  80 MG tablet Take 1 tablet  daily.  ? warfarin (COUMADIN) 5 MG tablet Take 1 to 2 tablets  /day as directed   ? ? ? ?Allergies  ?Allergen Reactions  ? Losartan Anaphylaxis  ? Crestor [Rosuvastatin] Elevated LFT's  ? Lorazepam Pt is unsure of reaction   ? Morphine And Related Has no effect on pt   ? ? ? ?PMHx:   ?Past Medical History:  ?Diagnosis Date  ? Adenomatous colon polyp   ? Allergy   ? BPH (benign prostatic hyperplasia)   ? CAD (coronary artery disease)   ? a. NSTEMI 09/2011 s/p PCI to LAD. b. CABG 01/2012 @ [facility].  ? Diabetes mellitus type 2, controlled ([place])   ? ESRD (end stage renal disease) ([place])   ? due IgA nephropathy - s/p kidnet transplant 09/25/11  ? Essential  hypertension   ? FHx: heart disease 02/27/2018  ? Glaucoma   ? Gout   ? Histoplasmosis   ? on itraconazole for prophylaxis  ? Ischemic cardiomyopathy   ? Mild aortic stenosis   ? Mild dilation of ascending aorta (HCC)   ? NSTEMI (non-ST elevated myocardial infarction) ([place]) 11/26/2013  ? 2013   ? OSA (obstructive sleep apnea)   ? PAF (paroxysmal atrial fibrillation) ([place])   ? Testosterone deficiency 02/27/2018  ? Trifascicular block   ? ? ? ?Immunization History  ?Administered Date(s) Administered  ? DT 06/05/2015  ? Hepatitis B 04/08/2009  ? Influenza, High Dose  01/02/2018, 12/21/2018, 01/31/2020, 01/15/2021  ? Influenza 12/25/2016  ? PFIZER SARS-COV-2 Vacc 07/24/2019, 08/17/2019, 03/14/2020  ? Pneumococcal -13 10/25/2013  ? Pneumococcal -23 06/05/2015, 11/30/2020  ? Pneumococcal -23 04/08/2008  ? Tdap 04/08/2008  ? Zoster Recombinat (Shingrix) 02/12/2018, 06/02/2018  ? ? ? ?Past Surgical History:  ?Procedure Laterality Date  ? ANT CERVICAL DECOMP/DISCECTOMY FUSION  05/04/2018  ? Procedure: Cervical Five-Six Anterior cervical decompression/discectomy/fusion;  Jovita Gamma, MD  ? AV FISTULA PLACEMENTLeft forearm  2011  ? COLONOSCOPY    ? CORONARY ANGIOPLASTY WITH STENT PLACEMENT    ? CORONARY ARTERY BYPASS GRAFT  2013  ? KIDNEY TRANSPLANT  09/25/2011  ? LAPAROSCOPIC APPENDECTOMY  04/06/2021  ? APPENDECTOMY LAPAROSCOPIC; Rolm Bookbinder, MD  ? PERITONEAL CATHETER INSERTION  2011  ? ? ?FHx:    Reviewed / unchanged ? ?SHx:    Reviewed / unchanged  ? ?Systems Review: ? ?Constitutional: Denies fever, chills, wt changes, headaches, insomnia, fatigue, night sweats, change in appetite. ?Eyes: Denies redness, blurred vision, diplopia, discharge, itchy, watery eyes.  ?ENT: Denies discharge, congestion, post nasal drip, epistaxis, sore throat, earache, hearing loss, dental pain, tinnitus, vertigo, sinus pain, snoring.  ?CV: Denies chest pain, palpitations, irregular heartbeat, syncope, dyspnea, diaphoresis, orthopnea,  PND, claudication or edema. ?Respiratory: denies cough, dyspnea, DOE, pleurisy, hoarseness, laryngitis, wheezing.  ?Gastrointestinal: Denies dysphagia, odynophagia, heartburn, reflux, water brash, abdominal pain or cramps, nausea, vomiting, bloating, diarrhea, constipation,  hematemesis, melena, hematochezia  or hemorrhoids. ?Genitourinary: Denies dysuria, frequency, urgency, nocturia, hesitancy, discharge, hematuria or flank pain. ?Musculoskeletal: Denies arthralgias, myalgias, stiffness, jt. swelling, pain, limping or strain/sprain.  ?Skin: Denies pruritus, rash, hives, warts, acne, eczema or change in skin lesion(s). ?Neuro: No weakness, tremor, incoordination, spasms, paresthesia or pain. ?Psychiatric: Denies confusion, memory loss or sensory loss. ?Endo: Denies change in weight, skin or hair change.  ?Heme/Lymph: No excessive bleeding, bruising or enlarged lymph nodes. ? ?Physical Exam ? ?BP 136/76   Pulse 68   Temp 97.9 ?F (36.6 ?C)   Resp 17   Ht '5\' 7"'$  (1.702 m)   Wt 182 lb 3.2 oz (82.6 kg)   SpO2 98%   BMI 28.54 kg/m?  ? ?Appears  over nourished, well groomed  and in no distress. ? ?Eyes: PERRLA, EOMs, conjunctiva no swelling or erythema. ?Sinuses: No frontal/maxillary tenderness ?ENT/Mouth: EAC's clear, TM's nl w/o erythema, bulging. Nares clear w/o erythema, swelling, exudates. Oropharynx clear without erythema or exudates. Oral hygiene is good. Tongue normal, non obstructing. Hearing intact.  ?Neck: Supple. Thyroid not palpable. Car 2+/2+ without bruits, nodes or JVD. ?Chest: Respirations nl with BS clear & equal w/o rales, rhonchi, wheezing or stridor.  ?Cor: Heart sounds soft w/sl irregular rate and rhythm with Gr 2/4 Ao  Sys  murmur.  Peripheral pulses normal and equal  without edema.  ?Abdomen: Soft & bowel sounds normal. Non-tender w/o guarding, rebound, hernias, masses or organomegaly.  ?Lymphatics: Unremarkable.  ?Musculoskeletal: Full ROM all peripheral extremities, joint stability, 5/5  strength and normal gait.  ?Skin: Warm, dry without exposed rashes, lesions or ecchymosis apparent.  ?Neuro: Cranial nerves intact, reflexes equal bilaterally. Sensory-motor testing grossly intact. Tendon reflexes grossly intact.

## 2021-08-06 ENCOUNTER — Ambulatory Visit (INDEPENDENT_AMBULATORY_CARE_PROVIDER_SITE_OTHER): Payer: Medicare Other | Admitting: Internal Medicine

## 2021-08-06 ENCOUNTER — Encounter: Payer: Self-pay | Admitting: Internal Medicine

## 2021-08-06 VITALS — BP 136/76 | HR 68 | Temp 97.9°F | Resp 17 | Ht 67.0 in | Wt 182.2 lb

## 2021-08-06 DIAGNOSIS — N182 Chronic kidney disease, stage 2 (mild): Secondary | ICD-10-CM

## 2021-08-06 DIAGNOSIS — E1169 Type 2 diabetes mellitus with other specified complication: Secondary | ICD-10-CM | POA: Diagnosis not present

## 2021-08-06 DIAGNOSIS — Z794 Long term (current) use of insulin: Secondary | ICD-10-CM

## 2021-08-06 DIAGNOSIS — Z79899 Other long term (current) drug therapy: Secondary | ICD-10-CM

## 2021-08-06 DIAGNOSIS — I480 Paroxysmal atrial fibrillation: Secondary | ICD-10-CM | POA: Diagnosis not present

## 2021-08-06 DIAGNOSIS — I10 Essential (primary) hypertension: Secondary | ICD-10-CM

## 2021-08-06 DIAGNOSIS — E1122 Type 2 diabetes mellitus with diabetic chronic kidney disease: Secondary | ICD-10-CM | POA: Diagnosis not present

## 2021-08-06 DIAGNOSIS — E785 Hyperlipidemia, unspecified: Secondary | ICD-10-CM

## 2021-08-06 DIAGNOSIS — Z7901 Long term (current) use of anticoagulants: Secondary | ICD-10-CM

## 2021-08-07 LAB — COMPLETE METABOLIC PANEL WITH GFR
AG Ratio: 1.7 (calc) (ref 1.0–2.5)
ALT: 20 U/L (ref 9–46)
AST: 22 U/L (ref 10–35)
Albumin: 3.8 g/dL (ref 3.6–5.1)
Alkaline phosphatase (APISO): 102 U/L (ref 35–144)
BUN: 22 mg/dL (ref 7–25)
CO2: 29 mmol/L (ref 20–32)
Calcium: 9.6 mg/dL (ref 8.6–10.3)
Chloride: 105 mmol/L (ref 98–110)
Creat: 0.98 mg/dL (ref 0.70–1.28)
Globulin: 2.3 g/dL (calc) (ref 1.9–3.7)
Glucose, Bld: 92 mg/dL (ref 65–99)
Potassium: 4.6 mmol/L (ref 3.5–5.3)
Sodium: 141 mmol/L (ref 135–146)
Total Bilirubin: 1.3 mg/dL — ABNORMAL HIGH (ref 0.2–1.2)
Total Protein: 6.1 g/dL (ref 6.1–8.1)
eGFR: 81 mL/min/{1.73_m2} (ref >=60)

## 2021-08-07 LAB — CBC WITH DIFFERENTIAL/PLATELET
Absolute Monocytes: 424 cells/uL (ref 200–950)
Basophils Absolute: 42 cells/uL (ref 0–200)
Basophils Relative: 0.8 %
Eosinophils Absolute: 122 cells/uL (ref 15–500)
Eosinophils Relative: 2.3 %
HCT: 41.2 % (ref 38.5–50.0)
Hemoglobin: 13.8 g/dL (ref 13.2–17.1)
Lymphs Abs: 1028 cells/uL (ref 850–3900)
MCH: 31.7 pg (ref 27.0–33.0)
MCHC: 33.5 g/dL (ref 32.0–36.0)
MCV: 94.5 fL (ref 80.0–100.0)
MPV: 10 fL (ref 7.5–12.5)
Monocytes Relative: 8 %
Neutro Abs: 3684 cells/uL (ref 1500–7800)
Neutrophils Relative %: 69.5 %
Platelets: 133 10*3/uL — ABNORMAL LOW (ref 140–400)
RBC: 4.36 10*6/uL (ref 4.20–5.80)
RDW: 12.5 % (ref 11.0–15.0)
Total Lymphocyte: 19.4 %
WBC: 5.3 10*3/uL (ref 3.8–10.8)

## 2021-08-07 LAB — PROTIME-INR
INR: 1.9 — ABNORMAL HIGH
Prothrombin Time: 18.6 s — ABNORMAL HIGH (ref 9.0–11.5)

## 2021-08-07 NOTE — Progress Notes (Signed)
<><><><><><><><><><><><><><><><><><><><><><><><><><><><><><><><><> ?<><><><><><><><><><><><><><><><><><><><><><><><><><><><><><><><><> ?-   Test results slightly outside the reference range are not unusual. ?If there is anything important, I will review this with you,  ?otherwise it is considered normal test values.  ?If you have further questions,  ?please do not hesitate to contact me at the office or via My Chart.  ?<><><><><><><><><><><><><><><><><><><><><><><><><><><><><><><><><> ?<><><><><><><><><><><><><><><><><><><><><><><><><><><><><><><><><> ? ?-  PT/INR = 1.9 x   - Borderline low, but would not change usual dose for now ? ? - but would be OK to to take 1 extra pill /week til next recheck ?<><><><><><><><><><><><><><><><><><><><><><><><><><><><><><><><><> ? ?- All Else - CBC - Kidneys - Electrolytes - Liver   -    all  Normal / OK ?<><><><><><><><><><><><><><><><><><><><><><><><><><><><><><><><><> ?<><><><><><><><><><><><><><><><><><><><><><><><><><><><><><><><><> ? ? ? ? ? ? ?

## 2021-09-06 ENCOUNTER — Ambulatory Visit: Payer: Medicare Other | Admitting: Nurse Practitioner

## 2021-09-06 ENCOUNTER — Ambulatory Visit: Payer: Medicare Other | Admitting: Adult Health

## 2021-09-06 NOTE — Progress Notes (Unsigned)
3 MONTH FOLLOW UP Assessment:   Essential hypertension - continue medications, DASH diet, exercise and monitor at home. Call if greater than 130/80.  -     CBC with Differential/Platelet  PAF (paroxysmal atrial fibrillation) (HCC) Check INR q month and will adjust medication according to labs.  Discussed if patient falls to immediately contact office or go to ER. Discussed foods that can increase or decrease Coumadin levels. Patient understands to call the office before starting a new medication. Follow up in one month.   Chronic systolic heart failure ([place]) Improved on recent ECHO 04/2018 Follows with cardiology  Control blood pressure, cholesterol, glucose, increase exercise.   Weights are down, appears euvolemic  Thrombocytopenia (HCC) Check CBC at routine visits  Medication management Check routine labs   Long term current use of anticoagulant therapy Monitor INR  Type 2 Diabetes Mellitus Education: Reviewed 'ABCs' of diabetes management (respective goals in parentheses):  A1C (<7), blood pressure (<130/80), and cholesterol (LDL <70) Eye Exam yearly and Dental Exam every 6 months - UTD  Dietary recommendations Physical Activity recommendations  CKD stage 2 due to type 2 diabetes mellitus ([place]) Discussed general issues about diabetes pathophysiology and management., Educational material distributed., Suggested low cholesterol diet., Encouraged aerobic exercise., Discussed foot care., Reminded to get yearly retinal exam.  Mild episode of recurrent major depressive disorder, in remission ([place]) Continue medications; may try reducing wellbutrin dose, sleep hygiene reviewed, keep log Lifestyle discussed: diet/exerise, sleep hygiene, stress management, hydration   Hx of renal transplant; AV fistula ([place]) S/p transplant in 2013, doing well since on on myfortic and prograf (follows with [facility]), Dr. Donnetta Hutching PRN for retained AV fistula    No orders of the defined  types were placed in this encounter.    Over 30 minutes of exam, counseling, chart review, and critical decision making was performed  Future Appointments  Date Time Provider [place]  09/07/2021  9:00 AM Magda Bernheim, NP [facility] None  10/17/2021  9:30 AM [name], MD [facility] None  01/04/2022  9:00 AM Darrol Jump, NP [facility] None  04/05/2022  9:30 AM Darrol Jump, NP [facility] None  07/05/2022  9:00 AM Liane Comber, NP [facility] None     Subjective:  Robert Arnold. is a 76 y.o. male who presents for 3 month follow up for T2DM, hld, htn, CKD, overweight, vit D def, CHF and a. Fib (coumadin monitoring).    He has history of depression, well controlled with wellbutrin 300 mg daily and trazodone 50 mg at night. He notes has had some more difficulty falling asleep, but does wake up feeling refreshed.   He notes brief daily dry cough, persistent several months, hasn't tried any allergy/GERD tx, also notes some persistent mild hoarseness/vocal quality change. Denies other notable allergy or GERD sx.   BMI is There is no height or weight on file to calculate BMI., he has been working on diet, admits exercise has been limited with weather. Does have a recumbent bike, would like to work up to 30 min 5 days a week. Walks in park when weather is good. He drinks 1-2 cups of coffee. Minimal alochol. Drinks lots of water. Stress levels are low.  Wt Readings from Last 3 Encounters:  08/06/21 182 lb 3.2 oz (82.6 kg)  07/04/21 178 lb 3.2 oz (80.8 kg)  06/06/21 179 lb (81.2 kg)   He has p. A. Fib, on coumadin; hx of CAD s/p stenting 09/2011 and CABG 02/2012, has done  well since. 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  He has hx of systolic CHF, improved/resolved on most recent ECHO 04/2018 however with diastolic dysfunction and LVEF 50-55%.  Follows with Dr. Acie Fredrickson.   He has history of Afib and is on coumadin, he is on 5 mg daily, within goal range; no nose bleeds, blood  stool/urine. No missed doses, no ABX.  Lab Results  Component Value Date   INR 1.9 (H) 08/06/2021   INR 2.3 (H) 07/04/2021   INR 2.3 (H) 06/04/2021   His blood pressure has been controlled at home, today their BP is   He does workout, walking 4 days/week. He denies chest pain, shortness of breath, dizziness.    He is on cholesterol medication (atorvastatin 40 mg daily) and denies myalgias. His cholesterol is at goal. The cholesterol last visit was:   Lab Results  Component Value Date   CHOL 123 07/04/2021   HDL 65 07/04/2021   LDLCALC 45 07/04/2021   TRIG 44 07/04/2021   CHOLHDL 1.9 07/04/2021    He has been working on diet and exercise for well controlled T2 diabetes on novolin 70/30 22 units AM, 12-15 units units at night, and denies increased appetite, nausea, paresthesia of the feet, polydipsia, polyuria and visual disturbances.  Reports fasting around 80-140.  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  On prograf and myfortic, follows  kidney  Now back in CKD 2 stage;  Lab Results  Component Value Date   GFRNONAA >60 04/10/2021   Patient is on Vitamin D supplement, alternates 10000 IU and 5000 IU every other day.  Lab Results  Component Value Date   VD25OH 41 06/04/2021     Patient is on allopurinol for gout and does not report a recent flare.  Lab Results  Component Value Date   LABURIC 3.4 (L) 06/04/2021   He notes stream is slow to start; denies nocturia, straining, dribbling. Declined med and monitoring for now.  Lab Results  Component Value Date   PSA <0.04 07/04/2021   PSA <0.1 04/05/2019   PSA <0.1 03/03/2018      Medication Review: Current Outpatient Medications on File Prior to Visit  Medication Sig Dispense Refill   allopurinol (ZYLOPRIM)  300 MG tablet Take 1/2 tablet Daily  to Prevent  Gout 90 tablet 3   aspirin EC 81 MG tablet Take 81 mg by mouth daily.     atorvastatin (LIPITOR) 40 MG tablet Take 1 tab daily for cholesterol. 90 tablet 3   buPROPion (WELLBUTRIN XL) 300 MG 24 hr tablet Take  1 tablet  Daily for Mood, Focus & Concentration 90 tablet 3   cyclobenzaprine (FLEXERIL) 10 MG tablet Take 1 tablet (10 mg total) by mouth 3 (three) times daily as needed for muscle spasms. 60 tablet 1   finasteride (PROSCAR) 5 MG tablet Take  1 tablet  Daily  for Prostate 90 tablet 3   gabapentin (NEURONTIN) 300 MG capsule Take one capsule three times daily 270 capsule 1   glucose blood (FREESTYLE TEST STRIPS) test strip Check blood sugar 3 to 4 times daily for medication regulation. 450 each PRN   insulin NPH-regular Human (NOVOLIN 70/30) (70-30) 100 UNIT/ML injection Inject 12-22 Units into the skin in the morning and at bedtime. [place]  units in the AM, 12-15 units in the PM. 10 mL 99   latanoprost (XALATAN) 0.005 % ophthalmic solution Place 1 drop into both eyes at bedtime.   4   Magnesium 500 MG TABS Take 1 tablet (500 mg total) by mouth daily. 30 tablet    mycophenolate (MYFORTIC) 360 MG TBEC EC tablet TAKE 1 TABLET BY MOUTH TWICE DAILY 180 tablet 1   tacrolimus (PROGRAF) 0.5 MG capsule Take 0.5 mg by mouth 2 (two) times daily. With '1mg'$  capsule for total of 1.'5mg'$  twice daily     tacrolimus (PROGRAF) 1 MG capsule Take 1 mg by mouth 2 (two) times daily. With  0.5g capsule for total of 1.'5mg'$  twice daily     tamsulosin (FLOMAX) 0.4 MG CAPS capsule Take 1 cap daily at night for slow urine stream/prostate. 90 capsule 3   traZODone (DESYREL) 50 MG tablet TAKE ONE TABLET BY MOUTH DAILY ONE HOUR BEFORE BEDTIME AS NEEDED FOR SLEEP 90 tablet 3   triamcinolone cream (KENALOG) 0.1 % Apply 1 application topically 2 (two) times daily. Apply  to rash  2 x /day 80 g 3   valsartan (DIOVAN) 80 MG tablet Take 1 tablet (80 mg total) by mouth daily. 90 tablet 3    warfarin (COUMADIN) 5 MG tablet Take 1 to 2 tablets  /day as directed to Prevent Blood Clots                                   /         TAKE ONE TO TWO TABLETS BY MOUTH 180 tablet 3   No current facility-administered medications on file prior to visit.    Allergies: Allergies  Allergen Reactions   Losartan Anaphylaxis   Crestor [Rosuvastatin] Other (See Comments)    Elevated LFT's   Lorazepam     Pt is unsure of reaction    Morphine And Related Other (See Comments)    Has no effect on pt     Current Problems (verified) has Chronic combined systolic and diastolic CHF (congestive heart failure) ([place]); CKD stage 2 due to type 2 diabetes mellitus ([place]); Hyperlipidemia associated with type 2 diabetes mellitus ([place]); BPH with obstruction/lower urinary tract symptoms; Gout; A-fib ([place]); Essential hypertension; Vitamin D deficiency; Anticoagulant long-term use; Renal Transplant, s/p 09/2011; Depression, major, in remission ([place]); Thrombocytopenia ([place]); CAD (coronary artery disease) of artery bypass graft; IgA nephropathy; Essential tremor; Overweight (BMI 25.0-29.9); Senile purpura ([place]); Diabetes mellitus type 2, controlled ([place]); Former smoker (quit 1992); HNP (herniated nucleus pulposus), cervical; S/P cervical spinal fusion; Chronic hip pain, bilateral; Hypercoagulopathy ([place]); Mobitz I; A-V fistula ([place]); First degree AV block; History of skin cancer; B12 deficiency; and Acquired dilation of ascending aorta and aortic root (HCC) on their problem list.  Surgical: He  has a past surgical history that includes Colonoscopy; Peritoneal catheter insertion (2011); AV fistula placement (2011); Kidney transplant (09/25/2011); Coronary angioplasty with stent; Coronary artery bypass graft (2013); Anterior cervical decomp/discectomy fusion (N/A, 05/04/2018); and laparoscopic appendectomy (N/A, 04/06/2021). Family His family history includes Alcohol abuse in his brother and maternal grandfather; Alcoholism in  his brother; Alzheimer's disease in his brother; COPD in his brother and mother; Diabetes in his mother; Heart attack in his father; Kidney disease in his sister. Social history  He reports that he quit smoking about 30 years ago. His smoking use included cigarettes. He started smoking about 55 years ago. He has  a 48.00 pack-year smoking history. He has never used smokeless tobacco. He reports current alcohol use. He reports that he does not use drugs.   Review of Systems  Constitutional:  Negative for malaise/fatigue and weight loss.  HENT:  Negative for hearing loss and tinnitus.   Eyes:  Negative for blurred vision and double vision.  Respiratory:  Positive for cough (dry, brief but daily and persistent). Negative for sputum production, shortness of breath and wheezing.   Cardiovascular:  Negative for chest pain, palpitations, orthopnea, claudication, leg swelling and PND.  Gastrointestinal:  Negative for abdominal pain, blood in stool, constipation, diarrhea, heartburn, melena, nausea and vomiting.  Genitourinary: Negative.   Musculoskeletal:  Negative for falls, joint pain and myalgias.  Skin:  Negative for rash.  Neurological:  Negative for dizziness, tingling, sensory change, weakness and headaches.  Endo/Heme/Allergies:  Negative for environmental allergies and polydipsia.  Psychiatric/Behavioral: Negative.  Negative for depression, memory loss, substance abuse and suicidal ideas. The patient is not nervous/anxious and does not have insomnia.   All other systems reviewed and are negative.    Objective:   There were no vitals filed for this visit.  There is no height or weight on file to calculate BMI.  General Appearance: Well nourished, in no apparent distress. Eyes: PERRLA, EOMs, conjunctiva no swelling or erythema Sinuses: No Frontal/maxillary tenderness ENT/Mouth: Ext aud canals clear, TMs without erythema, bulging. No erythema, swelling, or exudate on post pharynx.   Tonsils not swollen or erythematous. Hearing normal. Vocal quality not obviously hoarse.  Neck: Supple, thyroid normal.  Respiratory: Respiratory effort normal, BS equal bilaterally without rales, rhonchi, wheezing or stridor.  Cardio: Irregularly irregular, 3/6 early systolic blowing murmur. Brisk LE pulses, without edema. Has AV fistula to left forearm with thrill. Sensation intact, cap refill brisk Abdomen: Soft, + BS.  Non tender, no guarding, rebound, hernias, masses. Lymphatics: Non tender without lymphadenopathy.  Musculoskeletal: Full ROM, 5/5 strength, normal gait. Excepting right wrist in splint; distal sensation intact.  Skin: Warm, dry without rashes; small ecchymoses to bilateral upper extremities;  Neuro: Cranial nerves intact. Normal muscle tone, no cerebellar symptoms.  Psych: Awake and oriented X 3, normal affect, Insight and Judgment appropriate.    Magda Bernheim, NP   09/06/2021

## 2021-09-07 ENCOUNTER — Encounter: Payer: Self-pay | Admitting: Nurse Practitioner

## 2021-09-07 ENCOUNTER — Ambulatory Visit (INDEPENDENT_AMBULATORY_CARE_PROVIDER_SITE_OTHER): Payer: Medicare Other | Admitting: Nurse Practitioner

## 2021-09-07 VITALS — BP 138/70 | HR 67 | Temp 97.9°F | Resp 16 | Wt 181.6 lb

## 2021-09-07 DIAGNOSIS — E1122 Type 2 diabetes mellitus with diabetic chronic kidney disease: Secondary | ICD-10-CM

## 2021-09-07 DIAGNOSIS — I10 Essential (primary) hypertension: Secondary | ICD-10-CM | POA: Diagnosis not present

## 2021-09-07 DIAGNOSIS — Z7901 Long term (current) use of anticoagulants: Secondary | ICD-10-CM

## 2021-09-07 DIAGNOSIS — I480 Paroxysmal atrial fibrillation: Secondary | ICD-10-CM

## 2021-09-07 DIAGNOSIS — I5042 Chronic combined systolic (congestive) and diastolic (congestive) heart failure: Secondary | ICD-10-CM

## 2021-09-07 DIAGNOSIS — N182 Chronic kidney disease, stage 2 (mild): Secondary | ICD-10-CM

## 2021-09-07 DIAGNOSIS — E785 Hyperlipidemia, unspecified: Secondary | ICD-10-CM

## 2021-09-07 DIAGNOSIS — E1169 Type 2 diabetes mellitus with other specified complication: Secondary | ICD-10-CM

## 2021-09-07 DIAGNOSIS — I770 Arteriovenous fistula, acquired: Secondary | ICD-10-CM

## 2021-09-07 DIAGNOSIS — Z940 Kidney transplant status: Secondary | ICD-10-CM

## 2021-09-07 DIAGNOSIS — Z794 Long term (current) use of insulin: Secondary | ICD-10-CM

## 2021-09-07 DIAGNOSIS — I25810 Atherosclerosis of coronary artery bypass graft(s) without angina pectoris: Secondary | ICD-10-CM

## 2021-09-07 DIAGNOSIS — Z79899 Other long term (current) drug therapy: Secondary | ICD-10-CM

## 2021-09-07 DIAGNOSIS — D696 Thrombocytopenia, unspecified: Secondary | ICD-10-CM | POA: Diagnosis not present

## 2021-09-07 DIAGNOSIS — F325 Major depressive disorder, single episode, in full remission: Secondary | ICD-10-CM

## 2021-09-08 LAB — PROTIME-INR
INR: 2.7 — ABNORMAL HIGH
Prothrombin Time: 26.9 s — ABNORMAL HIGH (ref 9.0–11.5)

## 2021-09-08 LAB — CBC WITH DIFFERENTIAL/PLATELET
Absolute Monocytes: 387 cells/uL (ref 200–950)
Basophils Absolute: 41 cells/uL (ref 0–200)
Basophils Relative: 0.9 %
Eosinophils Absolute: 99 cells/uL (ref 15–500)
Eosinophils Relative: 2.2 %
HCT: 39.9 % (ref 38.5–50.0)
Hemoglobin: 13.1 g/dL — ABNORMAL LOW (ref 13.2–17.1)
Lymphs Abs: 1215 cells/uL (ref 850–3900)
MCH: 31.6 pg (ref 27.0–33.0)
MCHC: 32.8 g/dL (ref 32.0–36.0)
MCV: 96.4 fL (ref 80.0–100.0)
MPV: 9.8 fL (ref 7.5–12.5)
Monocytes Relative: 8.6 %
Neutro Abs: 2759 cells/uL (ref 1500–7800)
Neutrophils Relative %: 61.3 %
Platelets: 118 10*3/uL — ABNORMAL LOW (ref 140–400)
RBC: 4.14 10*6/uL — ABNORMAL LOW (ref 4.20–5.80)
RDW: 12.6 % (ref 11.0–15.0)
Total Lymphocyte: 27 %
WBC: 4.5 10*3/uL (ref 3.8–10.8)

## 2021-09-08 LAB — COMPLETE METABOLIC PANEL WITH GFR
AG Ratio: 1.9 (calc) (ref 1.0–2.5)
ALT: 13 U/L (ref 9–46)
AST: 15 U/L (ref 10–35)
Albumin: 3.9 g/dL (ref 3.6–5.1)
Alkaline phosphatase (APISO): 89 U/L (ref 35–144)
BUN/Creatinine Ratio: 29 (calc) — ABNORMAL HIGH (ref 6–22)
BUN: 32 mg/dL — ABNORMAL HIGH (ref 7–25)
CO2: 27 mmol/L (ref 20–32)
Calcium: 9.5 mg/dL (ref 8.6–10.3)
Chloride: 106 mmol/L (ref 98–110)
Creat: 1.1 mg/dL (ref 0.70–1.28)
Globulin: 2.1 g/dL (calc) (ref 1.9–3.7)
Glucose, Bld: 72 mg/dL (ref 65–99)
Potassium: 4.3 mmol/L (ref 3.5–5.3)
Sodium: 140 mmol/L (ref 135–146)
Total Bilirubin: 1.4 mg/dL — ABNORMAL HIGH (ref 0.2–1.2)
Total Protein: 6 g/dL — ABNORMAL LOW (ref 6.1–8.1)
eGFR: 71 mL/min/{1.73_m2} (ref >=60)

## 2021-09-08 LAB — MAGNESIUM: Magnesium: 1.9 mg/dL (ref 1.5–2.5)

## 2021-09-18 ENCOUNTER — Ambulatory Visit: Payer: Medicare Other | Admitting: Nurse Practitioner

## 2021-10-02 ENCOUNTER — Other Ambulatory Visit: Payer: Self-pay

## 2021-10-02 MED ORDER — BUPROPION HCL ER (XL) 300 MG PO TB24
ORAL_TABLET | ORAL | 3 refills | Status: DC
Start: 2021-10-02 — End: 2021-10-14

## 2021-10-08 ENCOUNTER — Other Ambulatory Visit: Payer: Self-pay

## 2021-10-08 DIAGNOSIS — M1A3790 Chronic gout due to renal impairment, unspecified ankle and foot, without tophus (tophi): Secondary | ICD-10-CM

## 2021-10-08 MED ORDER — ALLOPURINOL 300 MG PO TABS: ORAL_TABLET | ORAL | 3 refills | Status: DC

## 2021-10-09 ENCOUNTER — Other Ambulatory Visit: Payer: Self-pay | Admitting: Internal Medicine

## 2021-10-09 DIAGNOSIS — M1A3790 Chronic gout due to renal impairment, unspecified ankle and foot, without tophus (tophi): Secondary | ICD-10-CM

## 2021-10-09 MED ORDER — ALLOPURINOL 300 MG PO TABS: ORAL_TABLET | ORAL | 3 refills | Status: DC

## 2021-10-10 ENCOUNTER — Ambulatory Visit: Payer: Medicare Other | Admitting: Adult Health

## 2021-10-14 ENCOUNTER — Other Ambulatory Visit: Payer: Self-pay | Admitting: Internal Medicine

## 2021-10-14 DIAGNOSIS — M1A3790 Chronic gout due to renal impairment, unspecified ankle and foot, without tophus (tophi): Secondary | ICD-10-CM

## 2021-10-14 MED ORDER — ALLOPURINOL 300 MG PO TABS: ORAL_TABLET | ORAL | 3 refills | Status: DC

## 2021-10-14 MED ORDER — BUPROPION HCL ER (XL) 300 MG PO TB24: ORAL_TABLET | ORAL | 3 refills | Status: DC

## 2021-10-15 ENCOUNTER — Other Ambulatory Visit: Payer: Self-pay | Admitting: Nurse Practitioner

## 2021-10-15 ENCOUNTER — Encounter: Payer: Self-pay | Admitting: Nurse Practitioner

## 2021-10-16 ENCOUNTER — Other Ambulatory Visit: Payer: Self-pay | Admitting: Nurse Practitioner

## 2021-10-16 DIAGNOSIS — M1A3790 Chronic gout due to renal impairment, unspecified ankle and foot, without tophus (tophi): Secondary | ICD-10-CM

## 2021-10-16 MED ORDER — ALLOPURINOL 300 MG PO TABS: ORAL_TABLET | ORAL | 0 refills | Status: DC

## 2021-10-16 NOTE — Progress Notes (Signed)
Future Appointments  Date Time Provider Department  10/17/2021  9:30 AM [name], MD [facility]  11/27/2021  9:30 AM Darrol Jump, NP [facility]  01/04/2022            Wellness  9:00 AM Darrol Jump, NP [facility]  04/05/2022  9:30 AM Darrol Jump, NP [facility]  07/05/2022             CPE   9:00 AM Darrol Jump, NP [facility]    History of Present Illness:      This very nice nice 76 y.o. MWM  presents for Coag  follow up with HTN, ASHD /CABG /pAfib, HLD, Pre-Diabetes and Vitamin D Deficiency.  Patient is on low dose Allopurinol for his Gout. 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       Patient is treated for HTN & BP has been controlled at home. Today's BP is at goal - 126/76.  After his Kidney Transplant in 2013, he had a po acute MI with PCA./Stenting. He also has hx/o Afib and is on coumadin . Patient has had no complaints of any cardiac type chest pain, palpitations, dyspnea / orthopnea / PND, dizziness, claudication, or dependent edema.       Hyperlipidemia is controlled with diet & meds. Patient denies myalgias or other med SE's. Last Lipids were at goal:  Lab Results  Component Value Date   CHOL 147 05/16/2020   HDL 64 05/16/2020   LDLCALC 66 05/16/2020   TRIG 87 05/16/2020   CHOLHDL 2.3 05/16/2020     Also, the patient has history of T2_NIDDM (2003) and was treated with Metformin til his Kidney transplant, then transitioned to Insulin.  Patient has had no symptoms of reactive hypoglycemia, diabetic polys, paresthesias or visual blurring.  Last A1c was not at goal:  Lab Results  Component Value Date   HGBA1C 6.1 (H) 05/16/2020                                                            Further, the patient also has history of Vitamin D Deficiency ("24" /2014) and supplements vitamin D without any suspected side-effects. Last vitamin D was at goal:  Lab  Results  Component Value Date   VD25OH 62 07/19/2019     Current Outpatient Medications on File Prior to Visit  Medication Sig   allopurinol 300 MG  Take  1 tablet  Daily    VITAMIN C 1000 MG  Take daily.   aspirin EC 81 MG Take daily.   atorvastatin  40 MG tablet Take 1 tablet Daily   Black Pepper-Turmeric 1,500 mg Take daily.   buPROPion-XL) 300 MG  Take  1 tablet  Daily    Calcium -Vit D 250 mg -125 u Take daily   finasteride 5 MG tablet Take  1 tablet  Daily  for Prostate   gabapentin 300 MG capsule TAKE 1 cap 3 x /day as needed   NOVOLIN 70/30 Take 20-30 units 2 x day or as directed   XALATAN ophth  soln Place 1 drop into both eyes at bedtime.    magic mouthwash w/lidocaine SOLN Swish and spit 5 ml every 2 hours as needed.   MAGNESIUM  400 MG tablet Take 2  times daily.    MYFORTIC 360 MG  TAKE 1 TABLET TWICE DAILY   Probiotic  Take 1 capsule daily.   PROGRAF 0.5 MG capsule Take 0.5 mg 2  times daily.   tacrolimus 1 MG capsule Take 1 mg 2 times daily.   traZODone  50 MG tablet Take  1 tab 1 hr  before Bedtime  as needed    VITAMIN D  5,000 Units  Takes 2 caps on MWF &  1 cap 4 days/week.   warfarin 5 MG tablet TAKE 1-2 TABLETS EVERY DAY    Zinc 50 MG CAPS Take daily.    Allergies  Allergen Reactions   Losartan Anaphylaxis   Crestor [Rosuvastatin]     Elevated LFT's   Lorazepam     Pt is unsure of reaction    Morphine And Related Other (See Comments)    Has no effect on pt     PMHx:   Past Medical History:  Diagnosis Date   Adenomatous colon polyp    Allergy    BPH (benign prostatic hyperplasia)    CHF (congestive heart failure) (HCC)    Chronic kidney disease    due IgA nephropathy - s/p kidnet transplant 09/25/11   Diabetes mellitus type 2, controlled ([place])    FHx: heart disease 02/27/2018   Glaucoma    Gout    Histoplasmosis    on itraconazole for prophylaxis   MI (myocardial infarction) ([place]) 09/26/2011   NSTEMI (non-ST elevated myocardial infarction)  ([place]) 11/26/2013   2013    OSA (obstructive sleep apnea)    PAF (paroxysmal atrial fibrillation) ([place])    s/p DC-CV in 6/13. Off coumadin due to ureteral bleed   Testosterone deficiency 02/27/2018    Immunization History  Administered Date(s) Administered   DT  06/05/2015   Hepatitis B 04/08/2009   Influenza, High Dose  01/02/2018, 12/21/2018, 01/31/2020   Influenz 12/25/2016   PFIZER SARS-COV-2 Vacc 07/24/2019, 08/17/2019   Pneumococcal - 13 10/25/2013   Pneumococcal - 23 06/05/2015   Pneumococcal - 23 04/08/2008   Tdap 04/08/2008   Zoster Recombinat (Shingrix) 02/12/2018, 06/02/2018     Past Surgical History:  Procedure Laterality Date   ANTERIOR CERVICAL DECOMP/DISCECTOMY Arne Cleveland, MD; N/A 05/04/2018   AV FISTULA PLACEMENT  2011   Left forearm   COLONOSCOPY     CORONARY ANGIOPLASTY WITH STENT PLACEMENT     CORONARY ARTERY BYPASS GRAFT  2013   KIDNEY TRANSPLANT  09/25/2011   PERITONEAL CATHETER INSERTION  2011    FHx:    Reviewed / unchanged  SHx:    Reviewed / unchanged   Systems Review:  Constitutional: Denies fever, chills, wt changes, headaches, insomnia, fatigue, night sweats, change in appetite. Eyes: Denies redness, blurred vision, diplopia, discharge, itchy, watery eyes.  ENT: Denies discharge, congestion, post nasal drip, epistaxis, sore throat, earache, hearing loss, dental pain, tinnitus, vertigo, sinus pain, snoring.  CV: Denies chest pain, palpitations, irregular heartbeat, syncope, dyspnea, diaphoresis, orthopnea, PND, claudication or edema. Respiratory: denies cough, dyspnea, DOE, pleurisy, hoarseness, laryngitis, wheezing.  Gastrointestinal: Denies dysphagia, odynophagia, heartburn, reflux, water brash, abdominal pain or cramps, nausea, vomiting, bloating, diarrhea, constipation, hematemesis, melena, hematochezia  or hemorrhoids. Genitourinary: Denies dysuria, frequency, urgency, nocturia, hesitancy, discharge, hematuria or flank  pain. Musculoskeletal: Denies arthralgias, myalgias, stiffness, jt. swelling, pain, limping or strain/sprain.  Skin: Denies pruritus, rash, hives, warts, acne, eczema or change in skin lesion(s). Neuro: No weakness, tremor, incoordination, spasms, paresthesia or  pain. Psychiatric: Denies confusion, memory loss or sensory loss. Endo: Denies change in weight, skin or hair change.  Heme/Lymph: No excessive bleeding, bruising or enlarged lymph nodes.  Physical Exam  There were no vitals taken for this visit.  Appears  well nourished, well groomed  and in no distress.  Eyes: PERRLA, EOMs, conjunctiva no swelling or erythema. Sinuses: No frontal/maxillary tenderness ENT/Mouth: EAC's clear, TM's nl w/o erythema, bulging. Nares clear w/o erythema, swelling, exudates. Oropharynx clear without erythema or exudates. Oral hygiene is good. Tongue normal, non obstructing. Hearing intact.  Neck: Supple. Thyroid not palpable. Car 2+/2+ without bruits, nodes or JVD. Chest: Respirations nl with BS clear & equal w/o rales, rhonchi, wheezing or stridor.  Cor: Heart sounds normal w/ regular rate and rhythm without sig. murmurs, gallops, clicks or rubs. Peripheral pulses normal and equal  without edema.  Abdomen: Soft & bowel sounds normal. Non-tender w/o guarding, rebound, hernias, masses or organomegaly.  Lymphatics: Unremarkable.  Musculoskeletal: Full ROM all peripheral extremities, joint stability, 5/5 strength and normal gait.  Skin: Warm, dry without exposed rashes, lesions or ecchymosis apparent.  Neuro: Cranial nerves intact, reflexes equal bilaterally. Sensory-motor testing grossly intact. Tendon reflexes grossly intact.  Pysch: Alert & oriented x 3.  Insight and judgement nl & appropriate. No ideations.  Assessment and Plan:  1. Essential hypertension  - Continue medication, monitor blood pressure at home.  - Continue DASH diet.  Reminder to go to the ER if any CP,  SOB, nausea, dizziness,  severe HA, changes vision/speech.   - CBC with Differential/Platelet - COMPLETE METABOLIC PANEL WITH GFR - Magnesium - TSH  2. Hyperlipidemia associated with type 2 diabetes mellitus ([place])  - Continue diet/meds, exercise,& lifestyle modifications.  - Continue monitor periodic cholesterol/liver & renal functions      - Lipid panel - TSH  3. Type 2 diabetes mellitus with stage 2 chronic kidney  disease, with long-term current use of insulin (HCC)  - Continue diet, exercise  - Lifestyle modifications.  - Monitor appropriate labs.    - Hemoglobin A1c - Insulin, random  4. Vitamin D deficiency  - VITAMIN D 25 Hydroxy   5. Chronic atrial fibrillation (HCC)  - Protime-INR  6. Idiopathic gout  - Uric acid  7. Anticoagulant long-term use  - Protime-INR  8. Medication management  - CBC with Differential/Platelet - COMPLETE METABOLIC PANEL WITH GFR - Magnesium - Lipid panel - Hemoglobin A1c - Insulin, random - VITAMIN D 25 Hydroxy  - TSH - Uric acid     Discussed  regular exercise, BP monitoring, weight control to achieve/maintain BMI less than 25 and discussed med and SE's. Recommended labs to assess and monitor clinical status with further disposition pending results of labs.  I discussed the assessment and treatment plan with the patient. The patient was provided an opportunity to ask questions and all were answered. The patient agreed with the plan and demonstrated an understanding of the instructions.  I provided over 30 minutes of exam, counseling, chart review and  complex critical decision making.        The patient was advised to call back or seek an in-person evaluation if the symptoms worsen or if the condition fails to improve as anticipated.   Kirtland Bouchard, MD

## 2021-10-16 NOTE — Patient Instructions (Signed)

## 2021-10-17 ENCOUNTER — Encounter: Payer: Self-pay | Admitting: Internal Medicine

## 2021-10-17 ENCOUNTER — Ambulatory Visit (INDEPENDENT_AMBULATORY_CARE_PROVIDER_SITE_OTHER): Payer: Medicare Other | Admitting: Internal Medicine

## 2021-10-17 VITALS — BP 145/75 | HR 62 | Temp 96.8°F | Ht 67.0 in | Wt 182.0 lb

## 2021-10-17 DIAGNOSIS — I482 Chronic atrial fibrillation, unspecified: Secondary | ICD-10-CM

## 2021-10-17 DIAGNOSIS — E1122 Type 2 diabetes mellitus with diabetic chronic kidney disease: Secondary | ICD-10-CM

## 2021-10-17 DIAGNOSIS — E559 Vitamin D deficiency, unspecified: Secondary | ICD-10-CM | POA: Diagnosis not present

## 2021-10-17 DIAGNOSIS — Z7901 Long term (current) use of anticoagulants: Secondary | ICD-10-CM

## 2021-10-17 DIAGNOSIS — E1169 Type 2 diabetes mellitus with other specified complication: Secondary | ICD-10-CM | POA: Diagnosis not present

## 2021-10-17 DIAGNOSIS — N182 Chronic kidney disease, stage 2 (mild): Secondary | ICD-10-CM

## 2021-10-17 DIAGNOSIS — E785 Hyperlipidemia, unspecified: Secondary | ICD-10-CM

## 2021-10-17 DIAGNOSIS — I10 Essential (primary) hypertension: Secondary | ICD-10-CM | POA: Diagnosis not present

## 2021-10-17 DIAGNOSIS — Z79899 Other long term (current) drug therapy: Secondary | ICD-10-CM

## 2021-10-17 DIAGNOSIS — M1000 Idiopathic gout, unspecified site: Secondary | ICD-10-CM

## 2021-10-17 MED ORDER — ZINC 50 MG PO TABS: ORAL_TABLET | ORAL | 0 refills | Status: DC

## 2021-10-18 LAB — COMPLETE METABOLIC PANEL WITH GFR
AG Ratio: 1.8 (calc) (ref 1.0–2.5)
ALT: 19 U/L (ref 9–46)
AST: 23 U/L (ref 10–35)
Albumin: 4.1 g/dL (ref 3.6–5.1)
Alkaline phosphatase (APISO): 96 U/L (ref 35–144)
BUN/Creatinine Ratio: 27 (calc) — ABNORMAL HIGH (ref 6–22)
BUN: 30 mg/dL — ABNORMAL HIGH (ref 7–25)
CO2: 27 mmol/L (ref 20–32)
Calcium: 9.8 mg/dL (ref 8.6–10.3)
Chloride: 104 mmol/L (ref 98–110)
Creat: 1.12 mg/dL (ref 0.70–1.28)
Globulin: 2.3 g/dL (calc) (ref 1.9–3.7)
Glucose, Bld: 92 mg/dL (ref 65–99)
Potassium: 4.9 mmol/L (ref 3.5–5.3)
Sodium: 139 mmol/L (ref 135–146)
Total Bilirubin: 1 mg/dL (ref 0.2–1.2)
Total Protein: 6.4 g/dL (ref 6.1–8.1)
eGFR: 69 mL/min/{1.73_m2} (ref >=60)

## 2021-10-18 LAB — CBC WITH DIFFERENTIAL/PLATELET
Absolute Monocytes: 405 cells/uL (ref 200–950)
Basophils Absolute: 51 cells/uL (ref 0–200)
Basophils Relative: 1.1 %
Eosinophils Absolute: 60 cells/uL (ref 15–500)
Eosinophils Relative: 1.3 %
HCT: 42.3 % (ref 38.5–50.0)
Hemoglobin: 14 g/dL (ref 13.2–17.1)
Lymphs Abs: 1076 cells/uL (ref 850–3900)
MCH: 31.4 pg (ref 27.0–33.0)
MCHC: 33.1 g/dL (ref 32.0–36.0)
MCV: 94.8 fL (ref 80.0–100.0)
MPV: 10 fL (ref 7.5–12.5)
Monocytes Relative: 8.8 %
Neutro Abs: 3008 cells/uL (ref 1500–7800)
Neutrophils Relative %: 65.4 %
Platelets: 134 10*3/uL — ABNORMAL LOW (ref 140–400)
RBC: 4.46 10*6/uL (ref 4.20–5.80)
RDW: 12.6 % (ref 11.0–15.0)
Total Lymphocyte: 23.4 %
WBC: 4.6 10*3/uL (ref 3.8–10.8)

## 2021-10-18 LAB — HEMOGLOBIN A1C
Hgb A1c MFr Bld: 5.5 % of total Hgb (ref <5.7)
Mean Plasma Glucose: 111 mg/dL
eAG (mmol/L): 6.2 mmol/L

## 2021-10-18 LAB — LIPID PANEL
Cholesterol: 131 mg/dL (ref <200)
HDL: 68 mg/dL (ref >=40)
LDL Cholesterol (Calc): 50 mg/dL (calc)
Non-HDL Cholesterol (Calc): 63 mg/dL (calc) (ref <130)
Total CHOL/HDL Ratio: 1.9 (calc) (ref <5.0)
Triglycerides: 53 mg/dL (ref <150)

## 2021-10-18 LAB — VITAMIN D 25 HYDROXY (VIT D DEFICIENCY, FRACTURES): Vit D, 25-Hydroxy: 80 ng/mL (ref 30–100)

## 2021-10-18 LAB — INSULIN, RANDOM: Insulin: 22.3 u[IU]/mL — ABNORMAL HIGH

## 2021-10-18 LAB — PROTIME-INR
INR: 1.6 — ABNORMAL HIGH
Prothrombin Time: 16.2 s — ABNORMAL HIGH (ref 9.0–11.5)

## 2021-10-18 LAB — TSH: TSH: 2 mIU/L (ref 0.40–4.50)

## 2021-10-18 LAB — MAGNESIUM: Magnesium: 1.9 mg/dL (ref 1.5–2.5)

## 2021-10-18 LAB — URIC ACID: Uric Acid, Serum: 5.1 mg/dL (ref 4.0–8.0)

## 2021-10-19 NOTE — Progress Notes (Signed)
<><><><><><><><><><><><><><><><><><><><><><><><><><><><><><><><><> <><><><><><><><><><><><><><><><><><><><><><><><><><><><><><><><><> -   Test results slightly outside the reference range are not unusual. If there is anything important, I will review this with you,  otherwise it is considered normal test values.  If you have further questions,  please do not hesitate to contact me at the office or via My Chart.  <><><><><><><><><><><><><><><><><><><><><><><><><><><><><><><><><> <><><><><><><><><><><><><><><><><><><><><><><><><><><><><><><><><>  -  Protime / INR   = 1.6 x - slightly low   - Recommend take an extra tablet now &   then next week start an extra tablet  2 x / week on Mon & Thurs                                                               Til next coumadin check in 1 month   <><><><><><><><><><><><><><><><><><><><><><><><><><><><><><><><><>  -  Kidney functions slightly decreased   ( GFR = 71) , So    - Kidney functions   look a little dehydrated    Very important to drink adequate amounts of fluids to prevent permanent damage    - Recommend drink at least 6 bottles (16 ounces) of                                                                             fluids /water /day = 96 Oz ~100 oz  - 100 oz = 3,000 cc or 3 liters / day  - >>                                                       That's 1 &1/2 bottles of a 2 liter soda bottle /day !   <><><><><><><><><><><><><><><><><><><><><><><><><><><><><><><><><>  -  Uric acid / Gout test is Normal  - Please continue Allopurinol  SAME  <><><><><><><><><><><><><><><><><><><><><><><><><><><><><><><><><>  -  Total Chol = 131  /  HDl = 68   /  & LDL Chol = 50   - All  Excellent   - Very low risk for Heart Attack  / Stroke  <><><><><><><><><><><><><><><><><><><><><><><><><><><><><><><><><>  -  A1c = 5.1% - Back in Normal  - No Diabetic Range   -  Wonderful. <><><><><><><><><><><><><><><><><><><><><><><><><><><><><><><><><>  -  Vitamin D = 80 - Excellent - Please keep dose same  <><><><><><><><><><><><><><><><><><><><><><><><><><><><><><><><><> <><><><><><><><><><><><><><><><><><><><><><><><><><><><><><><><><>  -  Keep up the [place] Work !  <><><><><><><><><><><><><><><><><><><><><><><><><><><><><><><><><> <><><><><><><><><><><><><><><><><><><><><><><><><><><><><><><><><>

## 2021-11-08 ENCOUNTER — Telehealth: Payer: Self-pay | Admitting: Cardiovascular Disease

## 2021-11-08 NOTE — Telephone Encounter (Signed)
Patient was returning phone call from Tuesday

## 2021-11-08 NOTE — Progress Notes (Signed)
Office Visit    Patient Name: Robert Arnold. Date of Encounter: 11/12/2021  Primary Care Provider:  [name], MD Primary Cardiologist:  Mertie Moores, MD Primary Electrophysiologist: None  Chief Complaint    Robert Raybourn. is a 76 y.o. 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  Past Medical History    Past Medical History:  Diagnosis Date   Adenomatous colon polyp    Allergy    BPH (benign prostatic hyperplasia)    CAD (coronary artery disease)    a. NSTEMI 09/2011 s/p PCI to LAD. b. CABG 01/2012 @ [facility].   Diabetes mellitus type 2, controlled ([place])    ESRD (end stage renal disease) ([place])    due IgA nephropathy - s/p kidnet transplant 09/25/11   Essential hypertension    FHx: heart disease 02/27/2018   Glaucoma    Gout    Histoplasmosis    on itraconazole for prophylaxis   Ischemic cardiomyopathy    Mild aortic stenosis    Mild dilation of ascending aorta (HCC)    NSTEMI (non-ST elevated myocardial infarction) ([place]) 11/26/2013   2013    OSA (obstructive sleep apnea)    PAF (paroxysmal atrial fibrillation) ([place])    Testosterone deficiency 02/27/2018   Trifascicular block    Past Surgical History:  Procedure Laterality Date   ANTERIOR CERVICAL DECOMP/DISCECTOMY FUSION N/A 05/04/2018   Procedure: Cervical Five-Six Anterior cervical decompression/discectomy/fusion;  Surgeon: Jovita Gamma, MD;  Location: [facility];  Service: Neurosurgery;  Laterality: N/A;  Cervical Five-Six Anterior cervical decompression/discectomy/fusion   AV FISTULA PLACEMENT  2011   Left forearm   COLONOSCOPY     CORONARY ANGIOPLASTY WITH STENT PLACEMENT     CORONARY ARTERY BYPASS GRAFT  2013   KIDNEY TRANSPLANT  09/25/2011   LAPAROSCOPIC APPENDECTOMY N/A 04/06/2021   Procedure: APPENDECTOMY LAPAROSCOPIC;   Surgeon: Rolm Bookbinder, MD;  Location: [facility];  Service: General;  Laterality: N/A;   PERITONEAL CATHETER INSERTION  2011    Allergies  Allergies  Allergen Reactions   Losartan Anaphylaxis   Crestor [Rosuvastatin] Other (See Comments)    Elevated LFT's   Lorazepam     Pt is unsure of reaction    Morphine And Related Other (See Comments)    Has no effect on pt     History of Present Illness    Robert Thoman. is a 76 year old male with the above-mentioned past medical history who presents today for follow-up of CAD.  Patient has a history of CAD dating back to 2013 when he underwent kidney transplant at [facility] and suffered NSTEMI with cardiac cath showing multi vessel CAD treated with PCI to LAD.  2D echo was performed and revealed a EF of 20%.  Patient also developed atrial fibrillation and was cardioverted and started on Coumadin.  On 9/13 patient was readmitted due to ureteral bleed that required a stent.  Cardiac cath was performed that showed multivessel CAD and patient underwent CABG x4.  2D echo from 2013 showed recovered EF of 50% and patient was lost to follow-up until reestablishing care with Dr. Acie Fredrickson in 2020.  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  2D echo was repeated that revealed EF of 50-55%grade 2 DD, mild LVH, mild AS, mildly dilated ascending aorta,  severely dilated LA, mild TR.  In 11/2020 patient was being seen for preoperative clearance visit and EKG revealed A-V dissociation with Mobitz 1 AVB that was superimposed on trifascicular block.  Patient was asymptomatic.  He was readmitted to the hospital 04/03/2021 with appendicitis and initial plan to transfer him to [place] for treatment of perforated appendix and abscess.  However patient underwent appendectomy without incident at [facility].  During admission patient developed right-sided chest pain that was worse with deep inhalation.  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  Chest pain was found not to be associated with ACS.  Patient was discharged with oral antibiotics in stable condition following procedure.  He was seen in follow-up by Dr. Acie Fredrickson last on 05/29/2021 with complaint of ongoing left rib pain.  This is usually resolved with Tylenol.  He was set up for a [place] study that revealed no reversible ischemia and previous infarct but no acute findings.  He was switched from [facility] to [facility] due to cost.  AV nodal blocking agents were recommended to be avoided due to first-degree AV block.  Since last being seen in the office patient reports he has been doing well with no cardiac complaints.  He does endorse a sharp pain on the left lower side that is not associated with activity and is relieved with taking an extra 81 mg ASA.  His most recent ischemic evaluation was 3/23 and revealed no ischemia.  He is euvolemic on examination and has been compliant with his sodium.  He is currently not exercising due to painful knees and hip.  He was encouraged to try to increase his physical activity as tolerated.  Patient denies, palpitations, dyspnea, PND, orthopnea, nausea, vomiting, dizziness, syncope, edema, weight gain, or early satiety.  Home Medications    Current Outpatient Medications  Medication Sig Dispense Refill   allopurinol (ZYLOPRIM) 300 MG tablet Take     1 tablet     Daily       to Prevent Gout 90 tablet 0   aspirin EC 81 MG tablet Take 81 mg by mouth daily.     atorvastatin (LIPITOR) 40 MG tablet Take 1 tab daily for cholesterol. 90 tablet 3   buPROPion (WELLBUTRIN XL) 300 MG 24 hr tablet Take  1 tablet  Daily for Mood, Focus & Concentration                                         /         TAKE                      BY                      MOUTH 90 tablet 3   cyclobenzaprine (FLEXERIL) 10 MG tablet Take 1 tablet (10 mg total) by mouth 3 (three) times daily as needed for muscle spasms. 60  tablet 1   empagliflozin (JARDIANCE) 10 MG TABS tablet Take 1 tablet (10 mg total) by mouth daily before breakfast. 30 tablet 6   finasteride (PROSCAR) 5 MG tablet Take  1 tablet  Daily  for Prostate 90 tablet 3   gabapentin (NEURONTIN) 300 MG capsule Take one capsule three times daily 270 capsule 1   glucose blood (  FREESTYLE TEST STRIPS) test strip Check blood sugar 3 to 4 times daily for medication regulation. 450 each PRN   insulin NPH-regular Human (NOVOLIN 70/30) (70-30) 100 UNIT/ML injection Inject 12-22 Units into the skin in the morning and at bedtime. 22 units in the AM, 12-15 units in the PM. 10 mL 99   latanoprost (XALATAN) 0.005 % ophthalmic solution Place 1 drop into both eyes at bedtime.   4   Magnesium 500 MG TABS Take 1 tablet (500 mg total) by mouth daily. 30 tablet    mycophenolate (MYFORTIC) 360 MG TBEC EC tablet TAKE 1 TABLET BY MOUTH TWICE DAILY 180 tablet 1   tacrolimus (PROGRAF) 0.5 MG capsule Take 0.5 mg by mouth 2 (two) times daily. With '1mg'$  capsule for total of 1.'5mg'$  twice daily     tacrolimus (PROGRAF) 1 MG capsule Take 1 mg by mouth 2 (two) times daily. With  0.5g capsule for total of 1.'5mg'$  twice daily     tamsulosin (FLOMAX) 0.4 MG CAPS capsule Take 1 cap daily at night for slow urine stream/prostate. 90 capsule 3   traZODone (DESYREL) 50 MG tablet TAKE ONE TABLET BY MOUTH DAILY ONE HOUR BEFORE BEDTIME AS NEEDED FOR SLEEP 90 tablet 3   triamcinolone cream (KENALOG) 0.1 % Apply 1 application topically 2 (two) times daily. Apply  to rash  2 x /day 80 g 3   valsartan (DIOVAN) 80 MG tablet Take 1 tablet (80 mg total) by mouth daily. 90 tablet 3   warfarin (COUMADIN) 5 MG tablet Take 1 to 2 tablets  /day as directed to Prevent Blood Clots                                   /         TAKE ONE TO TWO TABLETS BY MOUTH 180 tablet 3   Zinc 50 MG TABS Take 1 tablet Daily  0   No current facility-administered medications for this visit.     Review of Systems  Please see the  history of present illness.    (+) Sharp pain on the lateral left side (+) Painful hips and knees chronic  All other systems reviewed and are otherwise negative except as noted above.  Physical Exam    Wt Readings from Last 3 Encounters:  11/12/21 184 lb (83.5 kg)  10/17/21 182 lb (82.6 kg)  09/07/21 181 lb 9.6 oz (82.4 kg)   VS: Vitals:   11/12/21 0846  BP: 128/70  Pulse: 67  SpO2: 98%  ,Body mass index is 27.98 kg/m.  Constitutional:      Appearance: Healthy appearance. Not in distress.  Neck:     Vascular: JVD normal.  Pulmonary:     Effort: Pulmonary effort is normal.     Breath sounds: No wheezing. No rales. Diminished in the bases Cardiovascular:     Normal rate. Regular rhythm. Normal S1. Normal S2.      Murmurs: There is no murmur.  Edema:    Peripheral edema absent.  Abdominal:     Palpations: Abdomen is soft non tender. There is no hepatomegaly.  Skin:    General: Skin is warm and dry.  Neurological:     General: No focal deficit present.     Mental Status: Alert and oriented to person, place and time.     Cranial Nerves: Cranial nerves are intact.  EKG/LABS/Other Studies Reviewed    ECG  personally reviewed by me today - None completed today  Risk Assessment/Calculations:    CHA2DS2-VASc Score = 4   This indicates a 4.8% annual risk of stroke. The patient's score is based upon: CHF History: 1 HTN History: 0 Diabetes History: 1 Stroke History: 0 Vascular Disease History: 1 Age Score: 1 Gender Score: 0        Lab Results  Component Value Date   WBC 4.6 10/17/2021   HGB 14.0 10/17/2021   HCT 42.3 10/17/2021   MCV 94.8 10/17/2021   PLT 134 (L) 10/17/2021   Lab Results  Component Value Date   CREATININE 1.12 10/17/2021   BUN 30 (H) 10/17/2021   NA 139 10/17/2021   K 4.9 10/17/2021   CL 104 10/17/2021   CO2 27 10/17/2021   Lab Results  Component Value Date   ALT 19 10/17/2021   AST 23 10/17/2021   ALKPHOS 105 04/03/2021   BILITOT  1.0 10/17/2021   Lab Results  Component Value Date   CHOL 131 10/17/2021   HDL 68 10/17/2021   LDLCALC 50 10/17/2021   TRIG 53 10/17/2021   CHOLHDL 1.9 10/17/2021    Lab Results  Component Value Date   HGBA1C 5.5 10/17/2021    Assessment & Plan    1.  Coronary artery disease: -s/p CABG x4 9/13 with last ischemic evaluation 06/2021 with Lexiscan that revealed low risk study and no ischemia. -Current GDMT consist of ASA 81 mg, Lipitor 40 mg, currently not on beta-blocker due to history of heart block. -Today patient denies any anginal pain but does endorse a chronic left-sided pain that is associated with activity.  He was advised to contact office if pain is reproducible and more frequent in nature.  2.  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  Paroxysmal atrial fibrillation: -Patient's Coumadin managed by PCP with recent CBC and RBC of 4.4 and hemoglobin 14.0 -Rate today is sinus rhythm with a rate of 67 -CHA2DS2-VASc Score = 4 [CHF History: 1, HTN History: 0, Diabetes History: 1, Stroke History: 0, Vascular Disease History: 1, Age Score: 1, Gender Score: 0].  Therefore, the patient's annual risk of stroke is 4.8 %.      4.  Mild aortic stenosis and dilated ascending aorta: -Patient's blood pressure today was well controlled at 128/70 -Patient's last 2D echo was 04/10/2021 with aortic root measuring 38 mm with annual recheck of echo recommended  5.  Ischemic cardiomyopathy: -Last 2D echo was completed 04/2021 with EF of 45-50% -Patient was started on Entresto however had to stop due to affordability and is currently on valsartan.  Patient assistance discussed but patient declined at this time. -Patient was euvolemic on examination and states that he is compliant with abstaining from sodium. -We will start Jardiance 10 mg today if cost prohibitive we will start   spironolactone 12.5 mg instead.  6.  DM type II: -Last hemoglobin A1c was 6.1 -Currently treated with insulin per PCP  Disposition: Follow-up with Mertie Moores, MD or APP in 6 months   Medication Adjustments/Labs and Tests Ordered: Current medicines are reviewed at length with the patient today.  Concerns regarding medicines are outlined above.   Signed, Mable Fill, Marissa Nestle, NP 11/12/2021, 9:07 AM [place]

## 2021-11-12 ENCOUNTER — Ambulatory Visit (INDEPENDENT_AMBULATORY_CARE_PROVIDER_SITE_OTHER): Payer: Medicare Other | Admitting: Nurse Practitioner

## 2021-11-12 ENCOUNTER — Encounter: Payer: Self-pay | Admitting: Nurse Practitioner

## 2021-11-12 VITALS — BP 128/70 | HR 67 | Ht 68.0 in | Wt 184.0 lb

## 2021-11-12 DIAGNOSIS — I440 Atrioventricular block, first degree: Secondary | ICD-10-CM

## 2021-11-12 DIAGNOSIS — I25708 Atherosclerosis of coronary artery bypass graft(s), unspecified, with other forms of angina pectoris: Secondary | ICD-10-CM

## 2021-11-12 DIAGNOSIS — I5022 Chronic systolic (congestive) heart failure: Secondary | ICD-10-CM

## 2021-11-12 DIAGNOSIS — E1165 Type 2 diabetes mellitus with hyperglycemia: Secondary | ICD-10-CM

## 2021-11-12 DIAGNOSIS — I480 Paroxysmal atrial fibrillation: Secondary | ICD-10-CM

## 2021-11-12 MED ORDER — EMPAGLIFLOZIN 10 MG PO TABS: 10.0000 mg | ORAL_TABLET | Freq: Every day | ORAL | 6 refills | Status: DC

## 2021-11-12 NOTE — Patient Instructions (Signed)
Medication Instructions:  Your physician has recommended you make the following change in your medication:   START: Jardiance '10mg'$  daily (this has been sent to your pharmacy. Please let us know if you cannot afford this medication and we will send in a different one).  *If you need a refill on your cardiac medications before your next appointment, please call your pharmacy*   Lab Work: None If you have labs (blood work) drawn today and your tests are completely normal, you will receive your results only by: [place] (if you have MyChart) OR A paper copy in the mail If you have any lab test that is abnormal or we need to change your treatment, we will call you to review the results.   Follow-Up: At [facility], you and your health needs are our priority.  As part of our continuing mission to provide you with exceptional heart care, we have created designated Provider Care Teams.  These Care Teams include your primary Cardiologist (physician) and Advanced Practice Providers (APPs -  Physician Assistants and Nurse Practitioners) who all work together to provide you with the care you need, when you need it.  Your next appointment:   6 month(s)  The format for your next appointment:   In Person  Provider:   Mertie Moores, MD or Ambrose Pancoast, NP

## 2021-11-13 ENCOUNTER — Encounter: Payer: Self-pay | Admitting: Internal Medicine

## 2021-11-13 MED ORDER — SPIRONOLACTONE 25 MG PO TABS: 12.5000 mg | ORAL_TABLET | Freq: Every day | ORAL | 3 refills | Status: DC

## 2021-11-20 ENCOUNTER — Other Ambulatory Visit: Payer: Medicare Other

## 2021-11-20 ENCOUNTER — Other Ambulatory Visit: Payer: Self-pay | Admitting: Nurse Practitioner

## 2021-11-20 DIAGNOSIS — I5022 Chronic systolic (congestive) heart failure: Secondary | ICD-10-CM

## 2021-11-20 DIAGNOSIS — M1A3790 Chronic gout due to renal impairment, unspecified ankle and foot, without tophus (tophi): Secondary | ICD-10-CM

## 2021-11-20 LAB — BASIC METABOLIC PANEL
BUN/Creatinine Ratio: 27 — ABNORMAL HIGH (ref 10–24)
BUN: 32 mg/dL — ABNORMAL HIGH (ref 8–27)
CO2: 23 mmol/L (ref 20–29)
Calcium: 9.4 mg/dL (ref 8.6–10.2)
Chloride: 103 mmol/L (ref 96–106)
Creatinine, Ser: 1.19 mg/dL (ref 0.76–1.27)
Glucose: 96 mg/dL (ref 70–99)
Potassium: 4.6 mmol/L (ref 3.5–5.2)
Sodium: 138 mmol/L (ref 134–144)
eGFR: 64 mL/min/{1.73_m2} (ref >59)

## 2021-11-20 MED ORDER — ALLOPURINOL 300 MG PO TABS: ORAL_TABLET | ORAL | 3 refills | Status: DC

## 2021-11-27 ENCOUNTER — Ambulatory Visit (INDEPENDENT_AMBULATORY_CARE_PROVIDER_SITE_OTHER): Payer: Medicare Other | Admitting: Nurse Practitioner

## 2021-11-27 ENCOUNTER — Encounter: Payer: Self-pay | Admitting: Nurse Practitioner

## 2021-11-27 VITALS — BP 110/58 | HR 55 | Temp 97.5°F | Ht 67.0 in | Wt 186.0 lb

## 2021-11-27 DIAGNOSIS — R5383 Other fatigue: Secondary | ICD-10-CM

## 2021-11-27 DIAGNOSIS — I25810 Atherosclerosis of coronary artery bypass graft(s) without angina pectoris: Secondary | ICD-10-CM

## 2021-11-27 DIAGNOSIS — Z79899 Other long term (current) drug therapy: Secondary | ICD-10-CM

## 2021-11-27 DIAGNOSIS — I5042 Chronic combined systolic (congestive) and diastolic (congestive) heart failure: Secondary | ICD-10-CM | POA: Diagnosis not present

## 2021-11-27 DIAGNOSIS — Z7901 Long term (current) use of anticoagulants: Secondary | ICD-10-CM

## 2021-11-27 DIAGNOSIS — N182 Chronic kidney disease, stage 2 (mild): Secondary | ICD-10-CM

## 2021-11-27 DIAGNOSIS — I482 Chronic atrial fibrillation, unspecified: Secondary | ICD-10-CM

## 2021-11-27 DIAGNOSIS — I10 Essential (primary) hypertension: Secondary | ICD-10-CM

## 2021-11-27 DIAGNOSIS — E1122 Type 2 diabetes mellitus with diabetic chronic kidney disease: Secondary | ICD-10-CM

## 2021-11-27 NOTE — Progress Notes (Signed)
1 MONTH FOLLOW UP Assessment:   Essential hypertension Average is lower today than normal Check BP BID. If continues to remain low, may need to adjust spironolactone. Continue lifestyle modifications. Discussed DASH (Dietary Approaches to Stop Hypertension) DASH diet is lower in sodium than a typical American diet. Cut back on foods that are high in saturated fat, cholesterol, and trans fats. Eat more whole-grain foods, fish, poultry, and nuts  Other fatigue Discussed possibly related to new medication. Continue to monitor. Stay well hydrated.  PAF (paroxysmal atrial fibrillation) (HCC) Check INR q month and will adjust medication according to labs.  Discussed if patient falls to immediately contact office or go to ER. Discussed foods that can increase or decrease Coumadin levels. Patient understands to call the office before starting a new medication.  Chronic systolic heart failure ([place]) Improved on recent ECHO 04/2018 Follows with cardiology  Control blood pressure, cholesterol, glucose, increase exercise.    Long term current use of anticoagulant therapy Monitor INR  CKD stage 2 due to type 2 diabetes mellitus ([place]) Continue Spironolactone as directed. Continue ACE, bASA, statin Discussed how what you eat and drink can aide in kidney protection. Stay well hydrated. Avoid high salt foods. Avoid NSAIDS. Keep BP and BG well controlled.   Take medications as prescribed. Remain active and exercise as tolerated daily. Maintain weight.  Continue to monitor.  Medication Management Discussed main effects of Spironolactone. All questions and concerns regarding medications addressed.    Orders Placed This Encounter  Procedures   Protime-INR   COMPLETE METABOLIC PANEL WITH GFR     Over 30 minutes of exam, counseling, chart review, and critical decision making was performed  Future Appointments  Date Time Provider [place]  01/04/2022  9:00 AM Darrol Jump,  NP [facility] None  04/05/2022  9:30 AM Darrol Jump, NP [facility] None  07/05/2022  9:00 AM Darrol Jump, NP [facility] None     Subjective:  Robert Arnold. is a 76 y.o. male who presents for 1 month follow up for T2DM, hld, htn, CKD, overweight, vit D def, CHF and a. Fib (coumadin monitoring).    He has recent seen Cardiology and been placed on Spironolactone 12.5 mg for kidney protection s/tp transplanted kidney - 2013.  Continues to do well on myfortic and prograf, f/w [place] [facility] last on on 11/2021.  He reports some increased fatigue with starting the Spironolactone, and lower than normal blood pressures.  Denies any syncope, dizziness.  He has a hx of a. Fib and also presents for coumadin monitoring and long term anticoagulation. Hx of CAD s/p stenting 09/2011 and CABG 02/2012, has done well since. 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 He has hx of systolic CHF, improved/resolved on most recent ECHO 04/2018. He is continuing to take medications as prescribed.   Follows with Dr. Acie Fredrickson.   BMI is Body mass index is 29.13 kg/m., he has been working on diet, admits exercise has been limited with weather.  Wt Readings from Last 3 Encounters:  11/27/21 186 lb (84.4 kg)  11/12/21 184 lb (83.5 kg)  10/17/21 182 lb (82.6 kg)    He has history of Afib and is on coumadin, he is on 5 mg daily, within goal range; no nose bleeds, blood stool/urine. No missed doses, no ABX, no recent fall, injury, trauma. Lab Results  Component Value Date   INR 1.6 (H) 10/17/2021   INR 2.7 (H) 09/07/2021   INR 1.9 (H) 08/06/2021   His blood pressure  has been controlled at home, today their BP is BP: (!) 110/58  BP Readings from Last 3 Encounters:  11/27/21 (!) 110/58  11/12/21 128/70  10/17/21 (!) 145/75    He does workout, walking 4 days/week. He denies chest pain, shortness of breath, dizziness.   Well controlled T2 diabetes on novolin 70/30 22 units AM, 12-15 units units at night, and  denies increased appetite, nausea, paresthesia of the feet, polydipsia, polyuria and visual disturbances.  Reports fasting around 75-110.  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  On prograf and myfortic, follows [facility]  Now back in CKD 2 stage;  Lab Results  Component Value Date   GFRNONAA >60 04/10/2021    Medication Review: Current Outpatient Medications on File Prior to Visit  Medication Sig Dispense Refill   allopurinol (ZYLOPRIM) 300 MG tablet Take     1 tablet     Daily       to Prevent Gout 90 tablet 3   aspirin EC 81 MG tablet Take 81 mg by mouth daily.     atorvastatin (LIPITOR) 40 MG tablet Take 1 tab daily for cholesterol. 90 tablet 3   buPROPion (WELLBUTRIN XL) 300 MG 24 hr tablet Take  1 tablet  Daily for Mood, Focus & Concentration                                         /         TAKE                      BY                      MOUTH 90 tablet 3   cyclobenzaprine (FLEXERIL) 10 MG tablet Take 1 tablet (10 mg total) by mouth 3 (three) times daily as needed for muscle spasms. 60 tablet 1   finasteride (PROSCAR) 5 MG tablet Take  1 tablet  Daily  for Prostate 90 tablet 3   gabapentin (NEURONTIN) 300 MG capsule Take one capsule three times daily 270 capsule 1   glucose blood (FREESTYLE TEST STRIPS) test strip Check blood sugar 3 to 4 times daily for medication regulation. 450 each PRN   insulin NPH-regular Human (NOVOLIN 70/30) (70-30) 100 UNIT/ML injection Inject 12-22 Units into the skin in the morning and at bedtime. 22 units in the AM, 12-15 units in the PM. 10 mL 99   latanoprost (XALATAN) 0.005 % ophthalmic solution Place 1 drop into both eyes at bedtime.   4   Magnesium 500 MG TABS Take 1 tablet (500 mg total) by mouth daily. 30 tablet    mycophenolate  (MYFORTIC) 360 MG TBEC EC tablet TAKE 1 TABLET BY MOUTH TWICE DAILY 180 tablet 1   spironolactone (ALDACTONE) 25 MG tablet Take 0.5 tablets (12.5 mg total) by mouth daily. 45 tablet 3   tacrolimus (PROGRAF) 0.5 MG capsule Take 0.5 mg by mouth 2 (two) times daily. With '1mg'$  capsule for total of 1.'5mg'$  twice daily     tacrolimus (PROGRAF) 1 MG capsule Take 1 mg by mouth 2 (two) times daily. With  0.5g capsule for  total of 1.'5mg'$  twice daily     tamsulosin (FLOMAX) 0.4 MG CAPS capsule Take 1 cap daily at night for slow urine stream/prostate. 90 capsule 3   traZODone (DESYREL) 50 MG tablet TAKE ONE TABLET BY MOUTH DAILY ONE HOUR BEFORE BEDTIME AS NEEDED FOR SLEEP 90 tablet 3   triamcinolone cream (KENALOG) 0.1 % Apply 1 application topically 2 (two) times daily. Apply  to rash  2 x /day 80 g 3   valsartan (DIOVAN) 80 MG tablet Take 1 tablet (80 mg total) by mouth daily. 90 tablet 3   warfarin (COUMADIN) 5 MG tablet Take 1 to 2 tablets  /day as directed to Prevent Blood Clots                                   /         TAKE ONE TO TWO TABLETS BY MOUTH (Patient taking differently: Take one tablet daily and 2 tablets twice a week.) 180 tablet 3   Zinc 50 MG TABS Take 1 tablet Daily  0   No current facility-administered medications on file prior to visit.    Allergies: Allergies  Allergen Reactions   Losartan Anaphylaxis   Crestor [Rosuvastatin] Other (See Comments)    Elevated LFT's   Lorazepam     Pt is unsure of reaction    Morphine And Related Other (See Comments)    Has no effect on pt     Current Problems (verified) has Chronic combined systolic and diastolic CHF (congestive heart failure) ([place]); CKD stage 2 due to type 2 diabetes mellitus ([place]); Hyperlipidemia associated with type 2 diabetes mellitus ([place]); BPH with obstruction/lower urinary tract symptoms; Gout; A-fib ([place]); Essential hypertension; Vitamin D deficiency; Anticoagulant long-term use; Renal Transplant, s/p 09/2011; Depression,  major, in remission ([place]); Thrombocytopenia ([place]); CAD (coronary artery disease) of artery bypass graft; IgA nephropathy; Essential tremor; Overweight (BMI 25.0-29.9); Senile purpura ([place]); Diabetes mellitus type 2, controlled ([place]); Former smoker (quit 1992); HNP (herniated nucleus pulposus), cervical; S/P cervical spinal fusion; Chronic hip pain, bilateral; Hypercoagulopathy ([place]); Mobitz I; A-V fistula ([place]); First degree AV block; History of skin cancer; B12 deficiency; and Acquired dilation of ascending aorta and aortic root (HCC) on their problem list.  Surgical: He  has a past surgical history that includes Colonoscopy; Peritoneal catheter insertion (2011); AV fistula placement (2011); Kidney transplant (09/25/2011); Coronary angioplasty with stent; Coronary artery bypass graft (2013); Anterior cervical decomp/discectomy fusion (N/A, 05/04/2018); and laparoscopic appendectomy (N/A, 04/06/2021). Family His family history includes Alcohol abuse in his brother and maternal grandfather; Alcoholism in his brother; Alzheimer's disease in his brother; COPD in his brother and mother; Diabetes in his mother; Heart attack in his father; Kidney disease in his sister. Social history  He reports that he quit smoking about 31 years ago. His smoking use included cigarettes. He started smoking about 55 years ago. He has a 48.00 pack-year smoking history. He has never used smokeless tobacco. He reports current alcohol use. He reports that he does not use drugs.   Review of Systems  Constitutional:  Negative for malaise/fatigue and weight loss.  HENT:  Negative for hearing loss and tinnitus.   Eyes:  Negative for blurred vision and double vision.  Respiratory:  Negative for sputum production, shortness of breath and wheezing.   Cardiovascular:  Negative for chest pain, palpitations, orthopnea, claudication, leg swelling and PND.  Gastrointestinal:  Negative for abdominal pain,  blood in stool, constipation,  diarrhea, heartburn, melena, nausea and vomiting.  Genitourinary: Negative.   Musculoskeletal:  Negative for falls, joint pain and myalgias.  Skin:  Negative for rash.  Neurological:  Negative for dizziness, tingling, sensory change, weakness and headaches.  Endo/Heme/Allergies:  Negative for environmental allergies and polydipsia.  Psychiatric/Behavioral: Negative.  Negative for depression, memory loss, substance abuse and suicidal ideas. The patient is not nervous/anxious and does not have insomnia.   All other systems reviewed and are negative.     Objective:   Today's Vitals   11/27/21 0925  BP: (!) 110/58  Pulse: (!) 55  Temp: (!) 97.5 F (36.4 C)  SpO2: 98%  Weight: 186 lb (84.4 kg)  Height: '5\' 7"'$  (1.702 m)    Body mass index is 29.13 kg/m.  General Appearance: Well nourished, in no apparent distress. Eyes: PERRLA, EOMs, conjunctiva no swelling or erythema Sinuses: No Frontal/maxillary tenderness ENT/Mouth: Ext aud canals clear, TMs without erythema, bulging. No erythema, swelling, or exudate on post pharynx.  Tonsils not swollen or erythematous. Hearing normal. Vocal quality not obviously hoarse.  Neck: Supple, thyroid normal.  Respiratory: Respiratory effort normal, BS equal bilaterally without rales, rhonchi, wheezing or stridor.  Cardio: Irregularly irregular, 3/6 early systolic blowing murmur. Brisk LE pulses, without edema. Has AV fistula to left forearm with thrill. Sensation intact, cap refill brisk Abdomen: Soft, + BS.  Non tender, no guarding, rebound, hernias, masses. Lymphatics: Non tender without lymphadenopathy.  Musculoskeletal: Full ROM, 5/5 strength, normal gait. Excepting right wrist in splint; distal sensation intact.  Skin: Warm, dry without rashes; small ecchymoses to bilateral upper extremities;  Neuro: Cranial nerves intact. Normal muscle tone, no cerebellar symptoms.  Psych: Awake and oriented X 3, normal affect, Insight and Judgment appropriate.     Darrol Jump, NP   11/27/2021

## 2021-11-27 NOTE — Patient Instructions (Signed)

## 2021-11-28 ENCOUNTER — Other Ambulatory Visit: Payer: Self-pay

## 2021-11-28 ENCOUNTER — Encounter: Payer: Self-pay | Admitting: Nurse Practitioner

## 2021-11-28 DIAGNOSIS — I482 Chronic atrial fibrillation, unspecified: Secondary | ICD-10-CM

## 2021-11-28 LAB — COMPLETE METABOLIC PANEL WITH GFR
AG Ratio: 1.7 (calc) (ref 1.0–2.5)
ALT: 17 U/L (ref 9–46)
AST: 19 U/L (ref 10–35)
Albumin: 3.7 g/dL (ref 3.6–5.1)
Alkaline phosphatase (APISO): 96 U/L (ref 35–144)
BUN/Creatinine Ratio: 24 (calc) — ABNORMAL HIGH (ref 6–22)
BUN: 32 mg/dL — ABNORMAL HIGH (ref 7–25)
CO2: 26 mmol/L (ref 20–32)
Calcium: 9.5 mg/dL (ref 8.6–10.3)
Chloride: 106 mmol/L (ref 98–110)
Creat: 1.31 mg/dL — ABNORMAL HIGH (ref 0.70–1.28)
Globulin: 2.2 g/dL (calc) (ref 1.9–3.7)
Glucose, Bld: 82 mg/dL (ref 65–99)
Potassium: 4.7 mmol/L (ref 3.5–5.3)
Sodium: 139 mmol/L (ref 135–146)
Total Bilirubin: 0.9 mg/dL (ref 0.2–1.2)
Total Protein: 5.9 g/dL — ABNORMAL LOW (ref 6.1–8.1)
eGFR: 57 mL/min/{1.73_m2} — ABNORMAL LOW (ref >=60)

## 2021-11-28 LAB — PROTIME-INR
INR: 3.4 — ABNORMAL HIGH
Prothrombin Time: 32.9 s — ABNORMAL HIGH (ref 9.0–11.5)

## 2021-11-28 MED ORDER — WARFARIN SODIUM 5 MG PO TABS: ORAL_TABLET | ORAL | 3 refills | Status: DC

## 2021-12-11 ENCOUNTER — Ambulatory Visit (INDEPENDENT_AMBULATORY_CARE_PROVIDER_SITE_OTHER): Payer: Medicare Other

## 2021-12-11 ENCOUNTER — Other Ambulatory Visit: Payer: Self-pay

## 2021-12-11 VITALS — BP 102/64 | HR 74 | Temp 97.5°F | Ht 67.0 in | Wt 188.8 lb

## 2021-12-11 DIAGNOSIS — Z79899 Other long term (current) drug therapy: Secondary | ICD-10-CM

## 2021-12-11 DIAGNOSIS — Z7901 Long term (current) use of anticoagulants: Secondary | ICD-10-CM

## 2021-12-11 NOTE — Progress Notes (Signed)
Patient presents today for a Nurse Visit to have PT/ INR rechecked. Patient is taking medication 1 tab daily and 2 tabs once a week.

## 2021-12-12 LAB — PROTIME-INR
INR: 3 — ABNORMAL HIGH
Prothrombin Time: 29.6 s — ABNORMAL HIGH (ref 9.0–11.5)

## 2022-01-04 ENCOUNTER — Ambulatory Visit (INDEPENDENT_AMBULATORY_CARE_PROVIDER_SITE_OTHER): Payer: Medicare Other | Admitting: Nurse Practitioner

## 2022-01-04 ENCOUNTER — Encounter: Payer: Self-pay | Admitting: Nurse Practitioner

## 2022-01-04 VITALS — BP 138/72 | HR 58 | Temp 97.5°F | Ht 67.0 in | Wt 184.0 lb

## 2022-01-04 DIAGNOSIS — Z79899 Other long term (current) drug therapy: Secondary | ICD-10-CM

## 2022-01-04 DIAGNOSIS — I480 Paroxysmal atrial fibrillation: Secondary | ICD-10-CM

## 2022-01-04 DIAGNOSIS — R6889 Other general symptoms and signs: Secondary | ICD-10-CM

## 2022-01-04 DIAGNOSIS — M25551 Pain in right hip: Secondary | ICD-10-CM

## 2022-01-04 DIAGNOSIS — E785 Hyperlipidemia, unspecified: Secondary | ICD-10-CM

## 2022-01-04 DIAGNOSIS — G8929 Other chronic pain: Secondary | ICD-10-CM

## 2022-01-04 DIAGNOSIS — N138 Other obstructive and reflux uropathy: Secondary | ICD-10-CM

## 2022-01-04 DIAGNOSIS — E1122 Type 2 diabetes mellitus with diabetic chronic kidney disease: Secondary | ICD-10-CM

## 2022-01-04 DIAGNOSIS — D696 Thrombocytopenia, unspecified: Secondary | ICD-10-CM

## 2022-01-04 DIAGNOSIS — Z0001 Encounter for general adult medical examination with abnormal findings: Secondary | ICD-10-CM | POA: Diagnosis not present

## 2022-01-04 DIAGNOSIS — N182 Chronic kidney disease, stage 2 (mild): Secondary | ICD-10-CM

## 2022-01-04 DIAGNOSIS — I5042 Chronic combined systolic (congestive) and diastolic (congestive) heart failure: Secondary | ICD-10-CM | POA: Diagnosis not present

## 2022-01-04 DIAGNOSIS — M1000 Idiopathic gout, unspecified site: Secondary | ICD-10-CM | POA: Diagnosis not present

## 2022-01-04 DIAGNOSIS — Z0000 Encounter for general adult medical examination without abnormal findings: Secondary | ICD-10-CM

## 2022-01-04 DIAGNOSIS — D692 Other nonthrombocytopenic purpura: Secondary | ICD-10-CM

## 2022-01-04 DIAGNOSIS — Z23 Encounter for immunization: Secondary | ICD-10-CM | POA: Diagnosis not present

## 2022-01-04 DIAGNOSIS — M25552 Pain in left hip: Secondary | ICD-10-CM

## 2022-01-04 DIAGNOSIS — Z940 Kidney transplant status: Secondary | ICD-10-CM

## 2022-01-04 DIAGNOSIS — Z7901 Long term (current) use of anticoagulants: Secondary | ICD-10-CM

## 2022-01-04 DIAGNOSIS — N401 Enlarged prostate with lower urinary tract symptoms: Secondary | ICD-10-CM

## 2022-01-04 DIAGNOSIS — I770 Arteriovenous fistula, acquired: Secondary | ICD-10-CM

## 2022-01-04 DIAGNOSIS — E663 Overweight: Secondary | ICD-10-CM

## 2022-01-04 DIAGNOSIS — Z85828 Personal history of other malignant neoplasm of skin: Secondary | ICD-10-CM

## 2022-01-04 DIAGNOSIS — E559 Vitamin D deficiency, unspecified: Secondary | ICD-10-CM

## 2022-01-04 DIAGNOSIS — E1165 Type 2 diabetes mellitus with hyperglycemia: Secondary | ICD-10-CM

## 2022-01-04 DIAGNOSIS — E1169 Type 2 diabetes mellitus with other specified complication: Secondary | ICD-10-CM

## 2022-01-04 DIAGNOSIS — F325 Major depressive disorder, single episode, in full remission: Secondary | ICD-10-CM

## 2022-01-04 DIAGNOSIS — I10 Essential (primary) hypertension: Secondary | ICD-10-CM

## 2022-01-04 NOTE — Progress Notes (Signed)
AWV and 1 month FOLLOW UP Assessment:   Annual Medicare Wellness Visit Due annually  Health maintenance reviewed - bring covid 19 vaccine record - bring advanced directives, living will   Essential hypertension - continue medications, DASH diet, exercise and monitor at home. Call if greater than 130/80.  -     CBC with Differential/Platelet  PAF (paroxysmal atrial fibrillation) (HCC) Check INR q month and will adjust medication according to labs.  Discussed if patient falls to immediately contact office or go to ER. Discussed foods that can increase or decrease Coumadin levels. Patient understands to call the office before starting a new medication. Follow up in one month.   Chronic systolic heart failure ([place]) Improved on recent ECHO 04/2018 Follows with cardiology  Control blood pressure, cholesterol, glucose, increase exercise.   Weights are down, appears euvolemic  Thrombocytopenia (HCC) Check CBC at routine visits  Gout due to renal impairment, unspecified chronicity, unspecified site  recheck Uric acid as needed, Diet discussed, continue medications.  Vitamin D deficiency Continue supplement   Medication management Check routine labs   Long term current use of anticoagulant therapy Monitor INR  Benign prostatic hyperplasia without lower urinary tract symptoms Continue medications  Type 2 Diabetes Mellitus Education: Reviewed 'ABCs' of diabetes management (respective goals in parentheses):  A1C (<7), blood pressure (<130/80), and cholesterol (LDL <70) Eye Exam yearly and Dental Exam every 6 months - UTD  Dietary recommendations Physical Activity recommendations  CKD stage 2 due to type 2 diabetes mellitus ([place]) Discussed general issues about diabetes pathophysiology and management., Educational material distributed., Suggested low cholesterol diet., Encouraged aerobic exercise., Discussed foot care., Reminded to get yearly retinal exam.  Mild episode of  recurrent major depressive disorder, in remission ([place]) Continue medications; may try reducing wellbutrin dose, sleep hygiene reviewed, keep log Lifestyle discussed: diet/exerise, sleep hygiene, stress management, hydration   Overweight  - long discussion about weight loss, diet, and exercise -recommended diet heavy in fruits and veggies and low in animal meats, cheeses, and dairy products  Chronic bilateral hip pain  Improved; monitoring  Hx of renal transplant; AV fistula ([place]) S/p transplant in 2013, doing well since on on myfortic and prograf (follows with [facility]), Dr. Donnetta Hutching PRN for retained AV fistula   Senile purpura ([place]) R/t comadin, monitor, protect skin   History of skin cancer Unsure what type, non melanoma; protect skin, sunscreen encouraged; derm following    Orders Placed This Encounter  Procedures  . COMPLETE METABOLIC PANEL WITH GFR    Over 30 minutes of exam, counseling, chart review, and critical decision making was performed  Future Appointments  Date Time Provider [place]  02/08/2022  9:30 AM Darrol Jump, NP [facility] None  04/05/2022  9:30 AM Darrol Jump, NP [facility] None  07/05/2022  9:00 AM Darrol Jump, NP [facility] None  10/09/2022  9:00 AM Darrol Jump, NP [facility] None     Plan:   During the course of the visit the patient was educated and counseled about appropriate screening and preventive services including:   Pneumococcal vaccine  Influenza vaccine Prevnar 13 Td vaccine Screening electrocardiogram Colorectal cancer screening Diabetes screening Glaucoma screening Nutrition counseling    Subjective:  Robert Arnold. is a 76 y.o. male who presents for AWV and 1 month OV for coumadin.    He underwent C6-7 disc decompression and fusion in 04/2018 by Dr. Durene Cal and feels fully resolved without any issues.He has chronic bilateral hip pain but improves with steroid injection,  recently improved.    He has history of depression, well controlled with wellbutrin 300 mg daily and trazodone 50 mg at night. He notes has had some more difficulty falling asleep, but does wake up feeling refreshed.   BMI is Body mass index is 28.82 kg/m., he has been working on diet, admits exercise has been limited with weather. Does have a recumbent bike, would like to work up to 30 min 5 days a week. Walks in park when weather is good.  He drinks 1-2 cups of coffee. Minimal alochol. Drinks lots of water. Stress levels are low.  Wt Readings from Last 3 Encounters:  01/04/22 184 lb (83.5 kg)  12/11/21 188 lb 12.8 oz (85.6 kg)  11/27/21 186 lb (84.4 kg)   He has p. A. Fib, on coumadin; hx of CAD s/p stenting 09/2011 and CABG 02/2012, has done well since. 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  He has hx of systolic CHF, improved/resolved on most recent ECHO 04/2018 however with diastolic dysfunction and LVEF 50-55%.  Follows with Dr. Acie Fredrickson.   He has history of Afib and is on coumadin, he is on 5 mg other days, within goal range; no nose bleeds, blood stool/urine. No missed doses, no ABX.  Lab Results  Component Value Date   INR 3.0 (H) 12/11/2021   INR 3.4 (H) 11/27/2021   INR 1.6 (H) 10/17/2021   His blood pressure has been controlled at home, today their BP is BP: 138/72 He does workout, walking 4 days/week. He denies chest pain, shortness of breath, dizziness.    He is on cholesterol medication (atorvastatin 40 mg daily) and denies myalgias. His cholesterol is at goal. The cholesterol last visit was:   Lab Results  Component Value Date   CHOL 131 10/17/2021   HDL 68 10/17/2021   LDLCALC 50 10/17/2021   TRIG 53 10/17/2021   CHOLHDL 1.9 10/17/2021    He has been working on diet and exercise for well controlled T2 diabetes on novolin 70/30 22 units AM, 12-15 units units at night, and denies increased appetite, nausea, paresthesia of the feet, polydipsia, polyuria and visual disturbances.  Reports fasting  around 90-110. Does have occasional low glucose, always in the afternoon, none in several months.  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  On prograf and myfortic, follows [facility]  Now back in CKD 2 stage;  Lab Results  Component Value Date   GFRNONAA >60 04/10/2021   Patient is on Vitamin D supplement, alternates 10000 IU and 5000 IU every other day.  Lab Results  Component Value Date   VD25OH 80 10/17/2021     Patient is on allopurinol for gout and does not report a recent flare.  Lab Results  Component Value Date   LABURIC 5.1 10/17/2021   He notes stream is slow to start; denies nocturia, straining, dribbling. Declined med and monitoring for now.  Lab Results  Component Value Date   PSA <0.04 07/04/2021   PSA <0.1 04/05/2019   PSA <0.1 03/03/2018      Medication Review: Current Outpatient Medications on File Prior to Visit  Medication Sig Dispense Refill  . allopurinol (ZYLOPRIM) 300 MG tablet Take     1 tablet     Daily       to Prevent Gout  90 tablet 3  . aspirin EC 81 MG tablet Take 81 mg by mouth daily.    Marland Kitchen atorvastatin (LIPITOR) 40 MG tablet Take 1 tab daily for cholesterol. 90 tablet 3  . buPROPion (WELLBUTRIN XL) 300 MG 24 hr tablet Take  1 tablet  Daily for Mood, Focus & Concentration                                         /         TAKE                      BY                      MOUTH 90 tablet 3  . cyclobenzaprine (FLEXERIL) 10 MG tablet Take 1 tablet (10 mg total) by mouth 3 (three) times daily as needed for muscle spasms. 60 tablet 1  . finasteride (PROSCAR) 5 MG tablet Take  1 tablet  Daily  for Prostate 90 tablet 3  . gabapentin (NEURONTIN) 300 MG capsule Take one capsule three times daily 270 capsule 1  . glucose blood (FREESTYLE TEST STRIPS) test strip Check blood sugar  3 to 4 times daily for medication regulation. 450 each PRN  . insulin NPH-regular Human (NOVOLIN 70/30) (70-30) 100 UNIT/ML injection Inject 12-22 Units into the skin in the morning and at bedtime. 22 units in the AM, 12-15 units in the PM. 10 mL 99  . latanoprost (XALATAN) 0.005 % ophthalmic solution Place 1 drop into both eyes at bedtime.   4  . Magnesium 500 MG TABS Take 1 tablet (500 mg total) by mouth daily. 30 tablet   . mycophenolate (MYFORTIC) 360 MG TBEC EC tablet TAKE 1 TABLET BY MOUTH TWICE DAILY 180 tablet 1  . spironolactone (ALDACTONE) 25 MG tablet Take 0.5 tablets (12.5 mg total) by mouth daily. 45 tablet 3  . tacrolimus (PROGRAF) 0.5 MG capsule Take 0.5 mg by mouth 2 (two) times daily. With '1mg'$  capsule for total of 1.'5mg'$  twice daily    . tacrolimus (PROGRAF) 1 MG capsule Take 1 mg by mouth 2 (two) times daily. With  0.5g capsule for total of 1.'5mg'$  twice daily    . tamsulosin (FLOMAX) 0.4 MG CAPS capsule Take 1 cap daily at night for slow urine stream/prostate. 90 capsule 3  . traZODone (DESYREL) 50 MG tablet TAKE ONE TABLET BY MOUTH DAILY ONE HOUR BEFORE BEDTIME AS NEEDED FOR SLEEP 90 tablet 3  . triamcinolone cream (KENALOG) 0.1 % Apply 1 application topically 2 (two) times daily. Apply  to rash  2 x /day 80 g 3  . valsartan (DIOVAN) 80 MG tablet Take 1 tablet (80 mg total) by mouth daily. 90 tablet 3  . warfarin (COUMADIN) 5 MG tablet Take 1 tablet daily and 2 tablets once a week 180 tablet 3  . Zinc 50 MG TABS Take 1 tablet Daily  0   No current facility-administered medications on file prior to visit.    Allergies: Allergies  Allergen Reactions  . Losartan Anaphylaxis  . Crestor [Rosuvastatin] Other (See Comments)    Elevated LFT's  . Lorazepam     Pt is unsure of reaction   . Morphine And Related Other (See Comments)    Has no effect on pt     Current Problems (verified) has Chronic combined  systolic and diastolic CHF (congestive heart failure) ([place]); CKD stage 2  due to type 2 diabetes mellitus ([place]); Hyperlipidemia associated with type 2 diabetes mellitus ([place]); BPH with obstruction/lower urinary tract symptoms; Gout; A-fib ([place]); Essential hypertension; Vitamin D deficiency; Anticoagulant long-term use; Renal Transplant, s/p 09/2011; Depression, major, in remission ([place]); Thrombocytopenia ([place]); CAD (coronary artery disease) of artery bypass graft; IgA nephropathy; Essential tremor; Overweight (BMI 25.0-29.9); Senile purpura ([place]); Diabetes mellitus type 2, controlled ([place]); Former smoker (quit 1992); HNP (herniated nucleus pulposus), cervical; S/P cervical spinal fusion; Chronic hip pain, bilateral; Hypercoagulopathy ([place]); Mobitz I; A-V fistula ([place]); First degree AV block; History of skin cancer; B12 deficiency; and Acquired dilation of ascending aorta and aortic root (HCC) on their problem list.  Screening Tests Immunization History  Administered Date(s) Administered  . DT (Pediatric) 06/05/2015  . Hepatitis B 04/08/2009  . Influenza, High Dose Seasonal PF 12/27/2013, 01/17/2015, 01/11/2016, 01/02/2018, 12/21/2018, 01/31/2020, 01/15/2021  . Influenza-Unspecified 12/25/2016  . PFIZER(Purple Top)SARS-COV-2 Vaccination 07/24/2019, 08/17/2019, 03/14/2020  . Pneumococcal Conjugate-13 10/25/2013  . Pneumococcal Polysaccharide-23 06/05/2015, 11/30/2020  . Pneumococcal-Unspecified 04/08/2008  . Tdap 04/08/2008  . Zoster Recombinat (Shingrix) 02/12/2018, 06/02/2018   Preventative care: Last colonoscopy: 03/2016, 5 year follow up, Dr. Havery Moros, patient has phone number to call  CT AB 2012 CT chest 2010 Sleep study 2013 Echo 02/2013, 07/1738, normal systolic, diastolic dysfunction, LVEF 50-55% Stress test 01/2012  CXR 10/2016  Prior vaccinations: TD or Tdap: 2017  Influenza: 2021 Pneumococcal: 2017 - TODAY, immunosuppressed Prevnar13: 2015 Shingles/Zostavax: had 2/2, shingrix  Covid 19: had 3/3, 2021, pfizer + 2 boosters - requested send information    Names of Other Physician/Practitioners you currently use: 1. [facility] here for primary care 2. Dr. Nicki Reaper, [facility], last visit 12/16/2019, DM no retinopathy, follows [id number] to glaucoma 3. Dr. JMirna Mires dentist, last visit, 2021 q 6 months [facility]. [facility], has upcoming appointment in 1 month   Patient Care Team: MUnk Pinto MD as PCP - General (Internal Medicine) Nahser, PWonda Cheng MD as PCP - Cardiology (Cardiology) SMacarthur Critchley [place] Referring Physician (Optometry) UMadelon Lips MD as Consulting Physician (Nephrology) Armbruster, SCarlota Raspberry MD as Consulting Physician (Gastroenterology) WHarriett Sine MD as Consulting Physician (Dermatology)  Surgical: He  has a past surgical history that includes Colonoscopy; Peritoneal catheter insertion (2011); AV fistula placement (2011); Kidney transplant (09/25/2011); Coronary angioplasty with stent; Coronary artery bypass graft (2013); Anterior cervical decomp/discectomy fusion (N/A, 05/04/2018); and laparoscopic appendectomy (N/A, 04/06/2021). Family His family history includes Alcohol abuse in his brother and maternal grandfather; Alcoholism in his brother; Alzheimer's disease in his brother; COPD in his brother and mother; Diabetes in his mother; Heart attack in his father; Kidney disease in his sister. Social history  He reports that he quit smoking about 31 years ago. His smoking use included cigarettes. He started smoking about 55 years ago. He has a 48.00 pack-year smoking history. He has never used smokeless tobacco. He reports current alcohol use. He reports that he does not use drugs.  Depression/mood screen:      01/04/2022    9:38 AM  Depression screen PHQ 2/9  Decreased Interest 0  Down, Depressed, Hopeless 0  PHQ - 2 Score 0     Review of Systems  Constitutional:  Negative for malaise/fatigue and weight loss.  HENT:  Negative for hearing loss  and tinnitus.   Eyes:  Negative for blurred vision and double vision.  Respiratory:  Negative for  cough, sputum production, shortness of breath and wheezing.   Cardiovascular:  Negative for chest pain, palpitations, orthopnea, claudication, leg swelling and PND.  Gastrointestinal:  Negative for abdominal pain, blood in stool, constipation, diarrhea, heartburn, melena, nausea and vomiting.  Genitourinary: Negative.   Musculoskeletal:  Negative for falls, joint pain and myalgias.  Skin:  Negative for rash.  Neurological:  Negative for dizziness, tingling, sensory change, weakness and headaches.  Endo/Heme/Allergies:  Negative for polydipsia.  Psychiatric/Behavioral: Negative.  Negative for depression, memory loss, substance abuse and suicidal ideas. The patient is not nervous/anxious and does not have insomnia.   All other systems reviewed and are negative.     Objective:   Today's Vitals   01/04/22 0855  BP: 138/72  Pulse: (!) 58  Temp: (!) 97.5 F (36.4 C)  SpO2: 99%  Weight: 184 lb (83.5 kg)  Height: '5\' 7"'$  (1.702 m)   Body mass index is 28.82 kg/m.  General Appearance: Well nourished, in no apparent distress. Eyes: PERRLA, EOMs, conjunctiva no swelling or erythema Sinuses: No Frontal/maxillary tenderness ENT/Mouth: Ext aud canals clear, TMs without erythema, bulging. No erythema, swelling, or exudate on post pharynx.  Tonsils not swollen or erythematous. Hearing normal.  Neck: Supple, thyroid normal.  Respiratory: Respiratory effort normal, BS equal bilaterally without rales, rhonchi, wheezing or stridor.  Cardio: Irregularly irregular, 3/6 early systolic blowing murmur. Brisk LE pulses, without edema. Has AV fistula to left forearm with thrill. Sensation intact, cap refill brisk Abdomen: Soft, + BS.  Non tender, no guarding, rebound, hernias, masses. Lymphatics: Non tender without lymphadenopathy.  Musculoskeletal: Full ROM, 5/5 strength, normal gait. Excepting right wrist  in splint; distal sensation intact.  Skin: Warm, dry without rashes; small ecchymoses to bilateral upper extremities; he has scaly areas with erythematous base to forehead, left cheek x 2.  Neuro: Cranial nerves intact. Normal muscle tone, no cerebellar symptoms.  Psych: Awake and oriented X 3, normal affect, Insight and Judgment appropriate.  GU: doing regular self exams, denies concerns, declines  EKG: defer - just had by cardiology 05/12/2020   The patient's weight, height, BMI have been recorded in the chart.  I have made referrals, counseling, and provided education to the patient based on review of the above and I have provided the patient with a written personalized care plan for preventive services.     Darrol Jump, NP   01/04/2022

## 2022-01-05 LAB — COMPLETE METABOLIC PANEL WITH GFR
AG Ratio: 1.7 (calc) (ref 1.0–2.5)
ALT: 17 U/L (ref 9–46)
AST: 20 U/L (ref 10–35)
Albumin: 3.8 g/dL (ref 3.6–5.1)
Alkaline phosphatase (APISO): 99 U/L (ref 35–144)
BUN/Creatinine Ratio: 30 (calc) — ABNORMAL HIGH (ref 6–22)
BUN: 31 mg/dL — ABNORMAL HIGH (ref 7–25)
CO2: 28 mmol/L (ref 20–32)
Calcium: 9.5 mg/dL (ref 8.6–10.3)
Chloride: 104 mmol/L (ref 98–110)
Creat: 1.02 mg/dL (ref 0.70–1.28)
Globulin: 2.2 g/dL (calc) (ref 1.9–3.7)
Glucose, Bld: 140 mg/dL — ABNORMAL HIGH (ref 65–99)
Potassium: 4.5 mmol/L (ref 3.5–5.3)
Sodium: 138 mmol/L (ref 135–146)
Total Bilirubin: 1.3 mg/dL — ABNORMAL HIGH (ref 0.2–1.2)
Total Protein: 6 g/dL — ABNORMAL LOW (ref 6.1–8.1)
eGFR: 78 mL/min/{1.73_m2} (ref >=60)

## 2022-02-08 ENCOUNTER — Ambulatory Visit (INDEPENDENT_AMBULATORY_CARE_PROVIDER_SITE_OTHER): Payer: Medicare Other | Admitting: Nurse Practitioner

## 2022-02-08 ENCOUNTER — Encounter: Payer: Self-pay | Admitting: Nurse Practitioner

## 2022-02-08 VITALS — BP 150/70 | HR 70 | Temp 97.1°F | Ht 67.0 in | Wt 192.0 lb

## 2022-02-08 DIAGNOSIS — M25561 Pain in right knee: Secondary | ICD-10-CM

## 2022-02-08 DIAGNOSIS — I480 Paroxysmal atrial fibrillation: Secondary | ICD-10-CM

## 2022-02-08 DIAGNOSIS — Z7901 Long term (current) use of anticoagulants: Secondary | ICD-10-CM

## 2022-02-08 DIAGNOSIS — I25810 Atherosclerosis of coronary artery bypass graft(s) without angina pectoris: Secondary | ICD-10-CM

## 2022-02-08 DIAGNOSIS — E1122 Type 2 diabetes mellitus with diabetic chronic kidney disease: Secondary | ICD-10-CM | POA: Diagnosis not present

## 2022-02-08 DIAGNOSIS — G8929 Other chronic pain: Secondary | ICD-10-CM

## 2022-02-08 DIAGNOSIS — I10 Essential (primary) hypertension: Secondary | ICD-10-CM

## 2022-02-08 DIAGNOSIS — Z79899 Other long term (current) drug therapy: Secondary | ICD-10-CM

## 2022-02-08 DIAGNOSIS — N182 Chronic kidney disease, stage 2 (mild): Secondary | ICD-10-CM

## 2022-02-08 DIAGNOSIS — I5042 Chronic combined systolic (congestive) and diastolic (congestive) heart failure: Secondary | ICD-10-CM

## 2022-02-08 NOTE — Progress Notes (Signed)
1 MONTH FOLLOW UP Assessment:   Essential hypertension Average is lower today than normal Check BP BID. If continues to remain low, may need to adjust spironolactone. Continue lifestyle modifications. Discussed DASH (Dietary Approaches to Stop Hypertension) DASH diet is lower in sodium than a typical American diet. Cut back on foods that are high in saturated fat, cholesterol, and trans fats. Eat more whole-grain foods, fish, poultry, and nuts  PAF (paroxysmal atrial fibrillation) (HCC) Check INR q month and will adjust medication according to labs.  Discussed if patient falls to immediately contact office or go to ER. Discussed foods that can increase or decrease Coumadin levels. Patient understands to call the office before starting a new medication.  Chronic systolic heart failure ([place]) Improved on recent ECHO 04/2018 Follows with cardiology  Control blood pressure, cholesterol, glucose, increase exercise.    Long term current use of anticoagulant therapy Monitor INR  CKD stage 2 due to type 2 diabetes mellitus ([place]) Continue Spironolactone as directed. Continue ACE, bASA, statin Discussed how what you eat and drink can aide in kidney protection. Stay well hydrated. Avoid high salt foods. Avoid NSAIDS. Keep BP and BG well controlled.   Take medications as prescribed. Remain active and exercise as tolerated daily. Maintain weight.  Continue to monitor.  Medication Management Discussed main effects of Spironolactone. All questions and concerns regarding medications addressed.    Chronic right knee pain Continue knee brace. RICE when flared Continue to monitor  Orders Placed This Encounter  Procedures   COMPLETE METABOLIC PANEL WITH GFR   Protime-INR    Notify office for further evaluation and treatment, questions or concerns if any reported s/s fail to improve.   The patient was advised to call back or seek an in-person evaluation if any symptoms worsen or if the  condition fails to improve as anticipated.   Further disposition pending results of labs. Discussed med's effects and SE's.    I discussed the assessment and treatment plan with the patient. The patient was provided an opportunity to ask questions and all were answered. The patient agreed with the plan and demonstrated an understanding of the instructions.  Discussed med's effects and SE's. Screening labs and tests as requested with regular follow-up as recommended.  I provided 20 minutes of face-to-face time during this encounter including counseling, chart review, and critical decision making was preformed.   Future Appointments  Date Time Provider [place]  04/05/2022  9:30 AM Darrol Jump, NP [facility] None  07/05/2022  9:00 AM Darrol Jump, NP [facility] None  10/09/2022  9:00 AM Darrol Jump, NP [facility] None     Subjective:  Robert Arnold. is a 76 y.o. male who presents for 1 month follow up for T2DM, hld, htn, CKD, overweight, vit D def, CHF and a. Fib (coumadin monitoring).    Overall he reports feeling well.    [facility] 01/02/22 for right knee pain.  Has been wearing a brace and "taking it easy."  Some improvement.  Continues to have medical sideded knee pain with ambulation.  He presented to f/u on his MRI which noted to have significant degenerative issues with medial compartment failing.  Options discussed included activity modifications, PRP injection, surgical.  He has chosen to continue conservitive management and monitor over time.   He has recent seen Cardiology and been placed on Spironolactone 12.5 mg for kidney protection s/tp transplanted kidney - 2013.  Continues to do well on myfortic and prograf, f/w [facility] last  on on 11/2021.  He reports some increased fatigue with starting the Spironolactone, and lower than normal blood pressures.  Denies any syncope, dizziness.  He has a hx of a. Fib and also presents for coumadin  monitoring and long term anticoagulation. Hx of CAD s/p stenting 09/2011 and CABG 02/2012, has done well since. 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 He has hx of systolic CHF, improved/resolved on most recent ECHO 04/2018. He is continuing to take medications as prescribed.   Follows with Dr. Acie Fredrickson.   BMI is Body mass index is 30.07 kg/m., he has been working on diet, admits exercise has been limited with weather.  Wt Readings from Last 3 Encounters:  02/08/22 192 lb (87.1 kg)  01/04/22 184 lb (83.5 kg)  12/11/21 188 lb 12.8 oz (85.6 kg)    He has history of Afib and is on coumadin, he is on 5 mg daily, within goal range; no nose bleeds, blood stool/urine. No missed doses, no ABX, no recent fall, injury, trauma. Lab Results  Component Value Date   INR 3.0 (H) 12/11/2021   INR 3.4 (H) 11/27/2021   INR 1.6 (H) 10/17/2021   His blood pressure has been controlled at home, today their BP is BP: (!) 150/70  BP Readings from Last 3 Encounters:  02/08/22 (!) 150/70  01/04/22 138/72  12/11/21 102/64    He does workout, walking 4 days/week. He denies chest pain, shortness of breath, dizziness.   Well controlled T2 diabetes on novolin 70/30 22 units AM, 12-15 units units at night, and denies increased appetite, nausea, paresthesia of the feet, polydipsia, polyuria and visual disturbances.  Reports fasting around 75-110.  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  On prograf and myfortic, follows [facility]  Now back in CKD 2 stage;  Lab Results  Component Value Date   GFRNONAA >60 04/10/2021    Medication Review: Current Outpatient Medications on File Prior to Visit  Medication Sig Dispense Refill   allopurinol (ZYLOPRIM) 300 MG tablet Take     1 tablet     Daily       to Prevent Gout 90  tablet 3   aspirin EC 81 MG tablet Take 81 mg by mouth daily.     atorvastatin (LIPITOR) 40 MG tablet Take 1 tab daily for cholesterol. 90 tablet 3   buPROPion (WELLBUTRIN XL) 300 MG 24 hr tablet Take  1 tablet  Daily for Mood, Focus & Concentration                                         /         TAKE                      BY                      MOUTH 90 tablet 3   cyclobenzaprine (FLEXERIL) 10 MG tablet Take 1 tablet (10 mg total) by mouth 3 (three) times daily as needed for muscle spasms. 60 tablet 1   finasteride (PROSCAR) 5 MG tablet Take  1 tablet  Daily  for Prostate 90  tablet 3   gabapentin (NEURONTIN) 300 MG capsule Take one capsule three times daily 270 capsule 1   glucose blood (FREESTYLE TEST STRIPS) test strip Check blood sugar 3 to 4 times daily for medication regulation. 450 each PRN   insulin NPH-regular Human (NOVOLIN 70/30) (70-30) 100 UNIT/ML injection Inject 12-22 Units into the skin in the morning and at bedtime. 22 units in the AM, 12-15 units in the PM. 10 mL 99   latanoprost (XALATAN) 0.005 % ophthalmic solution Place 1 drop into both eyes at bedtime.   4   Magnesium 500 MG TABS Take 1 tablet (500 mg total) by mouth daily. 30 tablet    mycophenolate (MYFORTIC) 360 MG TBEC EC tablet TAKE 1 TABLET BY MOUTH TWICE DAILY 180 tablet 1   spironolactone (ALDACTONE) 25 MG tablet Take 0.5 tablets (12.5 mg total) by mouth daily. 45 tablet 3   tacrolimus (PROGRAF) 0.5 MG capsule Take 0.5 mg by mouth 2 (two) times daily. With '1mg'$  capsule for total of 1.'5mg'$  twice daily     tacrolimus (PROGRAF) 1 MG capsule Take 1 mg by mouth 2 (two) times daily. With  0.5g capsule for total of 1.'5mg'$  twice daily     tamsulosin (FLOMAX) 0.4 MG CAPS capsule Take 1 cap daily at night for slow urine stream/prostate. 90 capsule 3   traZODone (DESYREL) 50 MG tablet TAKE ONE TABLET BY MOUTH DAILY ONE HOUR BEFORE BEDTIME AS NEEDED FOR SLEEP 90 tablet 3   triamcinolone cream (KENALOG) 0.1 % Apply 1 application  topically 2 (two) times daily. Apply  to rash  2 x /day 80 g 3   valsartan (DIOVAN) 80 MG tablet Take 1 tablet (80 mg total) by mouth daily. 90 tablet 3   warfarin (COUMADIN) 5 MG tablet Take 1 tablet daily and 2 tablets once a week 180 tablet 3   Zinc 50 MG TABS Take 1 tablet Daily  0   No current facility-administered medications on file prior to visit.    Allergies: Allergies  Allergen Reactions   Losartan Anaphylaxis   Crestor [Rosuvastatin] Other (See Comments)    Elevated LFT's   Lorazepam     Pt is unsure of reaction    Morphine And Related Other (See Comments)    Has no effect on pt     Current Problems (verified) has Chronic combined systolic and diastolic CHF (congestive heart failure) ([place]); CKD stage 2 due to type 2 diabetes mellitus ([place]); Hyperlipidemia associated with type 2 diabetes mellitus ([place]); BPH with obstruction/lower urinary tract symptoms; Gout; A-fib ([place]); Essential hypertension; Vitamin D deficiency; Anticoagulant long-term use; Renal Transplant, s/p 09/2011; Depression, major, in remission ([place]); Thrombocytopenia ([place]); CAD (coronary artery disease) of artery bypass graft; IgA nephropathy; Essential tremor; Overweight (BMI 25.0-29.9); Senile purpura ([place]); Diabetes mellitus type 2, controlled ([place]); Former smoker (quit 1992); HNP (herniated nucleus pulposus), cervical; S/P cervical spinal fusion; Chronic hip pain, bilateral; Hypercoagulopathy ([place]); Mobitz I; A-V fistula ([place]); First degree AV block; History of skin cancer; B12 deficiency; and Acquired dilation of ascending aorta and aortic root (HCC) on their problem list.  Surgical: He  has a past surgical history that includes Colonoscopy; Peritoneal catheter insertion (2011); AV fistula placement (2011); Kidney transplant (09/25/2011); Coronary angioplasty with stent; Coronary artery bypass graft (2013); Anterior cervical decomp/discectomy fusion (N/A, 05/04/2018); and laparoscopic appendectomy (N/A,  04/06/2021). Family His family history includes Alcohol abuse in his brother and maternal grandfather; Alcoholism in his brother; Alzheimer's disease in his brother; COPD in his brother  and mother; Diabetes in his mother; Heart attack in his father; Kidney disease in his sister. Social history  He reports that he quit smoking about 31 years ago. His smoking use included cigarettes. He started smoking about 55 years ago. He has a 48.00 pack-year smoking history. He has never used smokeless tobacco. He reports current alcohol use. He reports that he does not use drugs.   Review of Systems  Constitutional:  Negative for malaise/fatigue and weight loss.  HENT:  Negative for hearing loss and tinnitus.   Eyes:  Negative for blurred vision and double vision.  Respiratory:  Negative for sputum production, shortness of breath and wheezing.   Cardiovascular:  Negative for chest pain, palpitations, orthopnea, claudication, leg swelling and PND.  Gastrointestinal:  Negative for abdominal pain, blood in stool, constipation, diarrhea, heartburn, melena, nausea and vomiting.  Genitourinary: Negative.   Musculoskeletal:  Negative for falls, joint pain and myalgias.  Skin:  Negative for rash.  Neurological:  Negative for dizziness, tingling, sensory change, weakness and headaches.  Endo/Heme/Allergies:  Negative for environmental allergies and polydipsia.  Psychiatric/Behavioral: Negative.  Negative for depression, memory loss, substance abuse and suicidal ideas. The patient is not nervous/anxious and does not have insomnia.   All other systems reviewed and are negative.   Objective:   Today's Vitals   02/08/22 0925  BP: (!) 150/70  Pulse: 70  Temp: (!) 97.1 F (36.2 C)  SpO2: 95%  Weight: 192 lb (87.1 kg)  Height: '5\' 7"'$  (1.702 m)    Body mass index is 30.07 kg/m.  General Appearance: Well nourished, in no apparent distress. Eyes: PERRLA, EOMs, conjunctiva no swelling or erythema Sinuses: No  Frontal/maxillary tenderness ENT/Mouth: Ext aud canals clear, TMs without erythema, bulging. No erythema, swelling, or exudate on post pharynx.  Tonsils not swollen or erythematous. Hearing normal. Vocal quality not obviously hoarse.  Neck: Supple, thyroid normal.  Respiratory: Respiratory effort normal, BS equal bilaterally without rales, rhonchi, wheezing or stridor.  Cardio: Irregularly irregular, 3/6 early systolic blowing murmur. Brisk LE pulses, without edema. Has AV fistula to left forearm with thrill. Sensation intact, cap refill brisk Abdomen: Soft, + BS.  Non tender, no guarding, rebound, hernias, masses. Lymphatics: Non tender without lymphadenopathy.  Musculoskeletal: Full ROM, 5/5 strength, normal gait. Excepting right wrist in splint; distal sensation intact.  Skin: Warm, dry without rashes; small ecchymoses to bilateral upper extremities;  Neuro: Cranial nerves intact. Normal muscle tone, no cerebellar symptoms.  Psych: Awake and oriented X 3, normal affect, Insight and Judgment appropriate.    Darrol Jump, NP   02/08/2022

## 2022-02-09 LAB — COMPLETE METABOLIC PANEL WITH GFR
AG Ratio: 1.6 (calc) (ref 1.0–2.5)
ALT: 19 U/L (ref 9–46)
AST: 21 U/L (ref 10–35)
Albumin: 3.8 g/dL (ref 3.6–5.1)
Alkaline phosphatase (APISO): 102 U/L (ref 35–144)
BUN/Creatinine Ratio: 30 (calc) — ABNORMAL HIGH (ref 6–22)
BUN: 31 mg/dL — ABNORMAL HIGH (ref 7–25)
CO2: 28 mmol/L (ref 20–32)
Calcium: 9.3 mg/dL (ref 8.6–10.3)
Chloride: 104 mmol/L (ref 98–110)
Creat: 1.05 mg/dL (ref 0.70–1.28)
Globulin: 2.4 g/dL (calc) (ref 1.9–3.7)
Glucose, Bld: 155 mg/dL — ABNORMAL HIGH (ref 65–99)
Potassium: 4.4 mmol/L (ref 3.5–5.3)
Sodium: 140 mmol/L (ref 135–146)
Total Bilirubin: 1.1 mg/dL (ref 0.2–1.2)
Total Protein: 6.2 g/dL (ref 6.1–8.1)
eGFR: 75 mL/min/{1.73_m2} (ref >=60)

## 2022-02-09 LAB — PROTIME-INR
INR: 3.2 — ABNORMAL HIGH
Prothrombin Time: 31.3 s — ABNORMAL HIGH (ref 9.0–11.5)

## 2022-02-11 ENCOUNTER — Encounter: Payer: Self-pay | Admitting: Nurse Practitioner

## 2022-02-24 ENCOUNTER — Encounter: Payer: Self-pay | Admitting: Nurse Practitioner

## 2022-03-20 ENCOUNTER — Encounter: Payer: Self-pay | Admitting: Internal Medicine

## 2022-04-05 ENCOUNTER — Ambulatory Visit (INDEPENDENT_AMBULATORY_CARE_PROVIDER_SITE_OTHER): Payer: Medicare Other | Admitting: Nurse Practitioner

## 2022-04-05 ENCOUNTER — Encounter: Payer: Self-pay | Admitting: Nurse Practitioner

## 2022-04-05 VITALS — BP 142/80 | HR 62 | Temp 97.2°F | Ht 67.0 in | Wt 190.8 lb

## 2022-04-05 DIAGNOSIS — I5042 Chronic combined systolic (congestive) and diastolic (congestive) heart failure: Secondary | ICD-10-CM

## 2022-04-05 DIAGNOSIS — I25810 Atherosclerosis of coronary artery bypass graft(s) without angina pectoris: Secondary | ICD-10-CM

## 2022-04-05 DIAGNOSIS — N182 Chronic kidney disease, stage 2 (mild): Secondary | ICD-10-CM

## 2022-04-05 DIAGNOSIS — I480 Paroxysmal atrial fibrillation: Secondary | ICD-10-CM

## 2022-04-05 DIAGNOSIS — I10 Essential (primary) hypertension: Secondary | ICD-10-CM | POA: Diagnosis not present

## 2022-04-05 DIAGNOSIS — Z79899 Other long term (current) drug therapy: Secondary | ICD-10-CM

## 2022-04-05 DIAGNOSIS — E1122 Type 2 diabetes mellitus with diabetic chronic kidney disease: Secondary | ICD-10-CM

## 2022-04-05 DIAGNOSIS — Z7901 Long term (current) use of anticoagulants: Secondary | ICD-10-CM | POA: Diagnosis not present

## 2022-04-05 MED ORDER — GABAPENTIN 300 MG PO CAPS: ORAL_CAPSULE | ORAL | 1 refills | Status: DC

## 2022-04-05 NOTE — Patient Instructions (Signed)

## 2022-04-05 NOTE — Progress Notes (Signed)
1 MONTH FOLLOW UP Assessment:   Essential hypertension Continue lifestyle modifications. Discussed DASH (Dietary Approaches to Stop Hypertension) DASH diet is lower in sodium than a typical American diet. Cut back on foods that are high in saturated fat, cholesterol, and trans fats. Eat more whole-grain foods, fish, poultry, and nuts  PAF (paroxysmal atrial fibrillation) (HCC) Check INR q month and will adjust medication according to labs.  Discussed if patient falls to immediately contact office or go to ER. Discussed foods that can increase or decrease Coumadin levels. Patient understands to call the office before starting a new medication.  Chronic systolic heart failure ([place]) Improved on recent ECHO 04/2018 Follows with cardiology  Control blood pressure, cholesterol, glucose, increase exercise.    Long term current use of anticoagulant therapy Monitor INR  CKD stage 2 due to type 2 diabetes mellitus ([place]) Continue Spironolactone as directed. Continue ACE, bASA, statin Discussed how what you eat and drink can aide in kidney protection. Stay well hydrated. Avoid high salt foods. Avoid NSAIDS. Keep BP and BG well controlled.   Take medications as prescribed. Remain active and exercise as tolerated daily. Maintain weight.  Continue to monitor.  Medication Management Discussed main effects of Spironolactone. All questions and concerns regarding medications addressed.     Orders Placed This Encounter  Procedures   Protime-INR    Notify office for further evaluation and treatment, questions or concerns if any reported s/s fail to improve.   The patient was advised to call back or seek an in-person evaluation if any symptoms worsen or if the condition fails to improve as anticipated.   Further disposition pending results of labs. Discussed med's effects and SE's.    I discussed the assessment and treatment plan with the patient. The patient was provided an opportunity  to ask questions and all were answered. The patient agreed with the plan and demonstrated an understanding of the instructions.  Discussed med's effects and SE's. Screening labs and tests as requested with regular follow-up as recommended.  I provided 20 minutes of face-to-face time during this encounter including counseling, chart review, and critical decision making was preformed.   Future Appointments  Date Time Provider [place]  07/05/2022  9:00 AM Darrol Jump, NP [facility] None  10/09/2022  9:00 AM Darrol Jump, NP [facility] None     Subjective:  Robert Arnold. is a 76 y.o. male who presents for 1 month follow up for T2DM, hld, htn, CKD, overweight, vit D def, CHF and a. Fib (coumadin monitoring).    Overall he reports feeling well.    He has recent seen Cardiology and been placed on Spironolactone 12.5 mg for kidney protection s/p transplanted kidney - 2013.  Continues to do well on myfortic and prograf, f/w [facility] last on on 03/2022.  He reports some increased fatigue with starting the Spironolactone, and lower than normal blood pressures.  Denies any syncope, dizziness.  He has a hx of a. Fib and also presents for coumadin monitoring and long term anticoagulation. Hx of CAD s/p stenting 09/2011 and CABG 02/2012, has done well since. 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 He has hx of systolic CHF, improved/resolved on most recent ECHO 04/2018. He is continuing to take medications as prescribed.   Continues to follow with Dr. Acie Fredrickson.  BMI is Body mass index is 29.88 kg/m., he has been working on diet and exercise however has changed diet over the holidays.  Wt Readings from Last 3 Encounters:  04/05/22  190 lb 12.8 oz (86.5 kg)  02/08/22 192 lb (87.1 kg)  01/04/22 184 lb (83.5 kg)    He has history of Afib and is on coumadin, he is on 5 mg daily, within goal range; no nose bleeds, blood stool/urine. No missed doses, no ABX, no recent fall, injury,  trauma. Lab Results  Component Value Date   INR 3.2 (H) 02/08/2022   INR 3.0 (H) 12/11/2021   INR 3.4 (H) 11/27/2021   His blood pressure has been controlled at home, today their BP is BP: (!) 142/80  BP Readings from Last 3 Encounters:  04/05/22 (!) 142/80  02/08/22 (!) 150/70  01/04/22 138/72    He does workout, walking 4 days/week. He denies chest pain, shortness of breath, dizziness.   Well controlled T2 diabetes on novolin 70/30 22 units AM, 12-15 units units at night, and denies increased appetite, nausea, paresthesia of the feet, polydipsia, polyuria and visual disturbances.  Reports fasting around 75-110.  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  On prograf and myfortic, follows [facility]  Now back in CKD 2 stage;  Lab Results  Component Value Date   GFRNONAA >60 04/10/2021    Medication Review: Current Outpatient Medications on File Prior to Visit  Medication Sig Dispense Refill   allopurinol (ZYLOPRIM) 300 MG tablet Take     1 tablet     Daily       to Prevent Gout 90 tablet 3   aspirin EC 81 MG tablet Take 81 mg by mouth daily.     atorvastatin (LIPITOR) 40 MG tablet Take 1 tab daily for cholesterol. 90 tablet 3   buPROPion (WELLBUTRIN XL) 300 MG 24 hr tablet Take  1 tablet  Daily for Mood, Focus & Concentration                                         /         TAKE                      BY                      MOUTH 90 tablet 3   cyclobenzaprine (FLEXERIL) 10 MG tablet Take 1 tablet (10 mg total) by mouth 3 (three) times daily as needed for muscle spasms. 60 tablet 1   finasteride (PROSCAR) 5 MG tablet Take  1 tablet  Daily  for Prostate 90 tablet 3   glucose blood (FREESTYLE TEST STRIPS) test strip Check blood sugar 3 to 4 times daily for medication regulation.  450 each PRN   insulin NPH-regular Human (NOVOLIN 70/30) (70-30) 100 UNIT/ML injection Inject 12-22 Units into the skin in the morning and at bedtime. 22 units in the AM, 12-15 units in the PM. 10 mL 99   latanoprost (XALATAN) 0.005 % ophthalmic solution Place 1 drop into both eyes at bedtime.   4   Magnesium 500 MG TABS Take 1 tablet (500 mg total) by mouth daily. 30 tablet    mycophenolate (MYFORTIC) 360 MG TBEC EC tablet TAKE 1 TABLET BY MOUTH TWICE DAILY 180 tablet 1  spironolactone (ALDACTONE) 25 MG tablet Take 0.5 tablets (12.5 mg total) by mouth daily. 45 tablet 3   tacrolimus (PROGRAF) 0.5 MG capsule Take 0.5 mg by mouth 2 (two) times daily. With '1mg'$  capsule for total of 1.'5mg'$  twice daily     tacrolimus (PROGRAF) 1 MG capsule Take 1 mg by mouth 2 (two) times daily. With  0.5g capsule for total of 1.'5mg'$  twice daily     tamsulosin (FLOMAX) 0.4 MG CAPS capsule Take 1 cap daily at night for slow urine stream/prostate. 90 capsule 3   traZODone (DESYREL) 50 MG tablet TAKE ONE TABLET BY MOUTH DAILY ONE HOUR BEFORE BEDTIME AS NEEDED FOR SLEEP 90 tablet 3   triamcinolone cream (KENALOG) 0.1 % Apply 1 application topically 2 (two) times daily. Apply  to rash  2 x /day 80 g 3   valsartan (DIOVAN) 80 MG tablet Take 1 tablet (80 mg total) by mouth daily. 90 tablet 3   warfarin (COUMADIN) 5 MG tablet Take 1 tablet daily and 2 tablets once a week 180 tablet 3   Zinc 50 MG TABS Take 1 tablet Daily  0   No current facility-administered medications on file prior to visit.    Allergies: Allergies  Allergen Reactions   Losartan Anaphylaxis   Crestor [Rosuvastatin] Other (See Comments)    Elevated LFT's   Lorazepam     Pt is unsure of reaction    Morphine And Related Other (See Comments)    Has no effect on pt     Current Problems (verified) has Chronic combined systolic and diastolic CHF (congestive heart failure) ([place]); CKD stage 2 due to type 2 diabetes mellitus ([place]); Hyperlipidemia associated  with type 2 diabetes mellitus ([place]); BPH with obstruction/lower urinary tract symptoms; Gout; A-fib ([place]); Essential hypertension; Vitamin D deficiency; Anticoagulant long-term use; Renal Transplant, s/p 09/2011; Depression, major, in remission ([place]); Thrombocytopenia ([place]); CAD (coronary artery disease) of artery bypass graft; IgA nephropathy; Essential tremor; Overweight (BMI 25.0-29.9); Senile purpura ([place]); Diabetes mellitus type 2, controlled ([place]); Former smoker (quit 1992); HNP (herniated nucleus pulposus), cervical; S/P cervical spinal fusion; Chronic hip pain, bilateral; Hypercoagulopathy ([place]); Mobitz I; A-V fistula ([place]); First degree AV block; History of skin cancer; B12 deficiency; and Acquired dilation of ascending aorta and aortic root (HCC) on their problem list.  Surgical: He  has a past surgical history that includes Colonoscopy; Peritoneal catheter insertion (2011); AV fistula placement (2011); Kidney transplant (09/25/2011); Coronary angioplasty with stent; Coronary artery bypass graft (2013); Anterior cervical decomp/discectomy fusion (N/A, 05/04/2018); and laparoscopic appendectomy (N/A, 04/06/2021). Family His family history includes Alcohol abuse in his brother and maternal grandfather; Alcoholism in his brother; Alzheimer's disease in his brother; COPD in his brother and mother; Diabetes in his mother; Heart attack in his father; Kidney disease in his sister. Social history  He reports that he quit smoking about 31 years ago. His smoking use included cigarettes. He started smoking about 56 years ago. He has a 48.00 pack-year smoking history. He has never used smokeless tobacco. He reports current alcohol use. He reports that he does not use drugs.   Review of Systems  Constitutional:  Negative for malaise/fatigue and weight loss.  HENT:  Negative for hearing loss and tinnitus.   Eyes:  Negative for blurred vision and double vision.  Respiratory:  Negative for sputum production,  shortness of breath and wheezing.   Cardiovascular:  Negative for chest pain, palpitations, orthopnea, claudication, leg swelling and PND.  Gastrointestinal:  Negative for abdominal pain,  blood in stool, constipation, diarrhea, heartburn, melena, nausea and vomiting.  Genitourinary: Negative.   Musculoskeletal:  Negative for falls, joint pain and myalgias.  Skin:  Negative for rash.  Neurological:  Negative for dizziness, tingling, sensory change, weakness and headaches.  Endo/Heme/Allergies:  Negative for environmental allergies and polydipsia.  Psychiatric/Behavioral: Negative.  Negative for depression, memory loss, substance abuse and suicidal ideas. The patient is not nervous/anxious and does not have insomnia.   All other systems reviewed and are negative.   Objective:   Today's Vitals   04/05/22 0931  BP: (!) 142/80  Pulse: 62  Temp: (!) 97.2 F (36.2 C)  SpO2: 97%  Weight: 190 lb 12.8 oz (86.5 kg)  Height: '5\' 7"'$  (1.702 m)    Body mass index is 29.88 kg/m.  General Appearance: Well nourished, in no apparent distress. Eyes: PERRLA, EOMs, conjunctiva no swelling or erythema Sinuses: No Frontal/maxillary tenderness ENT/Mouth: Ext aud canals clear, TMs without erythema, bulging. No erythema, swelling, or exudate on post pharynx.  Tonsils not swollen or erythematous. Hearing normal. Vocal quality not obviously hoarse.  Neck: Supple, thyroid normal.  Respiratory: Respiratory effort normal, BS equal bilaterally without rales, rhonchi, wheezing or stridor.  Cardio: Irregularly irregular, 3/6 early systolic blowing murmur. Brisk LE pulses, without edema. Has AV fistula to left forearm with thrill. Sensation intact, cap refill brisk Abdomen: Soft, + BS.  Non tender, no guarding, rebound, hernias, masses. Lymphatics: Non tender without lymphadenopathy.  Musculoskeletal: Full ROM, 5/5 strength, normal gait. Excepting right wrist in splint; distal sensation intact.  Skin: Warm, dry  without rashes; small ecchymoses to bilateral upper extremities;  Neuro: Cranial nerves intact. Normal muscle tone, no cerebellar symptoms.  Psych: Awake and oriented X 3, normal affect, Insight and Judgment appropriate.    Darrol Jump, NP   04/05/2022

## 2022-04-06 LAB — PROTIME-INR
INR: 1.6 — ABNORMAL HIGH
Prothrombin Time: 16.2 s — ABNORMAL HIGH (ref 9.0–11.5)

## 2022-04-09 ENCOUNTER — Encounter: Payer: Self-pay | Admitting: Nurse Practitioner

## 2022-04-14 ENCOUNTER — Other Ambulatory Visit: Payer: Self-pay | Admitting: Internal Medicine

## 2022-04-14 DIAGNOSIS — I482 Chronic atrial fibrillation, unspecified: Secondary | ICD-10-CM

## 2022-04-14 MED ORDER — WARFARIN SODIUM 5 MG PO TABS: ORAL_TABLET | ORAL | 3 refills | Status: DC

## 2022-04-19 ENCOUNTER — Other Ambulatory Visit: Payer: Self-pay | Admitting: Internal Medicine

## 2022-04-19 DIAGNOSIS — I482 Chronic atrial fibrillation, unspecified: Secondary | ICD-10-CM

## 2022-04-19 MED ORDER — WARFARIN SODIUM 5 MG PO TABS: ORAL_TABLET | ORAL | 3 refills | Status: DC

## 2022-04-24 ENCOUNTER — Other Ambulatory Visit: Payer: Self-pay

## 2022-04-24 DIAGNOSIS — N138 Other obstructive and reflux uropathy: Secondary | ICD-10-CM

## 2022-04-24 MED ORDER — FINASTERIDE 5 MG PO TABS: ORAL_TABLET | ORAL | 3 refills | Status: DC

## 2022-04-29 ENCOUNTER — Other Ambulatory Visit: Payer: Self-pay | Admitting: Internal Medicine

## 2022-04-29 DIAGNOSIS — I482 Chronic atrial fibrillation, unspecified: Secondary | ICD-10-CM

## 2022-04-29 MED ORDER — WARFARIN SODIUM 5 MG PO TABS: ORAL_TABLET | ORAL | 3 refills | Status: DC

## 2022-05-03 ENCOUNTER — Ambulatory Visit (INDEPENDENT_AMBULATORY_CARE_PROVIDER_SITE_OTHER): Payer: Medicare Other | Admitting: Nurse Practitioner

## 2022-05-03 ENCOUNTER — Encounter: Payer: Self-pay | Admitting: Nurse Practitioner

## 2022-05-03 VITALS — BP 130/70 | HR 59 | Temp 97.7°F | Ht 67.0 in | Wt 193.4 lb

## 2022-05-03 DIAGNOSIS — E1122 Type 2 diabetes mellitus with diabetic chronic kidney disease: Secondary | ICD-10-CM

## 2022-05-03 DIAGNOSIS — E785 Hyperlipidemia, unspecified: Secondary | ICD-10-CM

## 2022-05-03 DIAGNOSIS — I10 Essential (primary) hypertension: Secondary | ICD-10-CM

## 2022-05-03 DIAGNOSIS — I480 Paroxysmal atrial fibrillation: Secondary | ICD-10-CM

## 2022-05-03 DIAGNOSIS — N182 Chronic kidney disease, stage 2 (mild): Secondary | ICD-10-CM

## 2022-05-03 DIAGNOSIS — Z79899 Other long term (current) drug therapy: Secondary | ICD-10-CM

## 2022-05-03 DIAGNOSIS — Z7901 Long term (current) use of anticoagulants: Secondary | ICD-10-CM

## 2022-05-03 DIAGNOSIS — I5042 Chronic combined systolic (congestive) and diastolic (congestive) heart failure: Secondary | ICD-10-CM | POA: Diagnosis not present

## 2022-05-03 DIAGNOSIS — E1169 Type 2 diabetes mellitus with other specified complication: Secondary | ICD-10-CM

## 2022-05-03 NOTE — Progress Notes (Signed)
1 MONTH FOLLOW UP Assessment:   Essential hypertension Controlled Continue Spironolactone, Valsartan Continue lifestyle modifications. Discussed DASH (Dietary Approaches to Stop Hypertension) DASH diet is lower in sodium than a typical American diet. Cut back on foods that are high in saturated fat, cholesterol, and trans fats. Eat more whole-grain foods, fish, poultry, and nuts  PAF (paroxysmal atrial fibrillation) (HCC) Continue Warfarin as directed. Check INR q month and will adjust medication according to labs.  Discussed if patient falls to immediately contact office or go to ER. Discussed foods that can increase or decrease Coumadin levels. Patient understands to call the office before starting a new medication.  Chronic systolic heart failure ([place]) Improved on recent ECHO 04/2018 Follows with cardiology  Control blood pressure, cholesterol, glucose, increase exercise.    Long term current use of anticoagulant therapy Monitor INR  CKD stage 2 due to type 2 diabetes mellitus ([place]) Continue Spironolactone as directed. Continue ARB, bASA, statin Discussed how what you eat and drink can aide in kidney protection. Stay well hydrated. Avoid high salt foods. Avoid NSAIDS. Keep BP and BG well controlled.   Take medications as prescribed. Remain active and exercise as tolerated daily. Maintain weight.  Continue to monitor.  Medication Management All medications discussed and reviewed in full. All questions and concerns regarding medications addressed.    Hyperlipidemia Controlled Continue Atorvastatin Discussed lifestyle modifications. Recommended diet heavy in fruits and veggies, omega 3's. Decrease consumption of animal meats, cheeses, and dairy products. Remain active and exercise as tolerated. Continue to monitor. Check lipids/TSH  Orders Placed This Encounter  Procedures   CBC with Differential/Platelet   COMPLETE METABOLIC PANEL WITH GFR   Lipid panel    Protime-INR   Notify office for further evaluation and treatment, questions or concerns if any reported s/s fail to improve.   The patient was advised to call back or seek an in-person evaluation if any symptoms worsen or if the condition fails to improve as anticipated.   Further disposition pending results of labs. Discussed med's effects and SE's.    I discussed the assessment and treatment plan with the patient. The patient was provided an opportunity to ask questions and all were answered. The patient agreed with the plan and demonstrated an understanding of the instructions.  Discussed med's effects and SE's. Screening labs and tests as requested with regular follow-up as recommended.  I provided 20 minutes of face-to-face time during this encounter including counseling, chart review, and critical decision making was preformed.   Future Appointments  Date Time Provider [place]  06/06/2022  9:30 AM [name], MD [facility] None  07/05/2022  9:00 AM Darrol Jump, NP [facility] None  10/09/2022  9:00 AM Darrol Jump, NP [facility] None     Subjective:  Robert Arnold. is a 77 y.o. male who presents for 1 month follow up for T2DM, hld, htn, CKD, overweight, vit D def, CHF and a. Fib (coumadin monitoring).    Overall he reports feeling well.    Continues to f/u with Cardiology.  Continues Spironolactone 12.5 mg for kidney protection s/p transplanted kidney - 2013.  Continues to do well on myfortic and prograf, f/w [place] [facility]. Cardiology last on on 03/2022.    He has a hx of a. Fib and also presents for coumadin monitoring and long term anticoagulation. Hx of CAD s/p stenting 09/2011 and CABG 02/2012, has done well since. 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 He has hx of systolic CHF, improved/resolved on most recent ECHO  04/2018. He is continuing to take medications as prescribed.   Continues to follow with Dr. Acie Fredrickson.  BMI is Body mass index is 30.29 kg/m., he  has been working on diet and exercise. Wt Readings from Last 3 Encounters:  05/03/22 193 lb 6.4 oz (87.7 kg)  04/05/22 190 lb 12.8 oz (86.5 kg)  02/08/22 192 lb (87.1 kg)    He has history of Afib and is on coumadin, he is on 5 mg daily, within goal range; no nose bleeds, blood stool/urine. No missed doses, no ABX, no recent fall, injury, trauma. Lab Results  Component Value Date   INR 1.6 (H) 04/05/2022   INR 3.2 (H) 02/08/2022   INR 3.0 (H) 12/11/2021   His blood pressure has been controlled at home, today their BP is BP: 130/70  BP Readings from Last 3 Encounters:  05/03/22 130/70  04/05/22 (!) 142/80  02/08/22 (!) 150/70   He is on cholesterol medication Atorvastatin and denies myalgias. His cholesterol is at goal. The cholesterol last visit was:   Lab Results  Component Value Date   CHOL 131 10/17/2021   HDL 68 10/17/2021   LDLCALC 50 10/17/2021   TRIG 53 10/17/2021   CHOLHDL 1.9 10/17/2021     He does workout, walking 4 days/week. He denies chest pain, shortness of breath, dizziness.   Well controlled T2 diabetes on novolin 70/30 22 units AM, 12-15 units units at night, and denies increased appetite, nausea, paresthesia of the feet, polydipsia, polyuria and visual disturbances.  Reports fasting around 75-110.  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  On prograf and myfortic, follows [place] [facility]  Now back in CKD 2 stage;  Lab Results  Component Value Date   GFRNONAA >60 04/10/2021    Medication Review: Current Outpatient Medications on File Prior to Visit  Medication Sig Dispense Refill   allopurinol (ZYLOPRIM) 300 MG tablet Take     1 tablet     Daily       to Prevent Gout 90 tablet 3   aspirin EC 81 MG tablet Take 81 mg by mouth daily.     atorvastatin  (LIPITOR) 40 MG tablet Take 1 tab daily for cholesterol. 90 tablet 3   buPROPion (WELLBUTRIN XL) 300 MG 24 hr tablet Take  1 tablet  Daily for Mood, Focus & Concentration                                         /         TAKE                      BY                      MOUTH 90 tablet 3   cyclobenzaprine (FLEXERIL) 10 MG tablet Take 1 tablet (10 mg total) by mouth 3 (three) times daily as needed for muscle spasms. 60 tablet 1   finasteride (PROSCAR) 5 MG tablet Take  1 tablet  Daily  for Prostate 90 tablet 3   gabapentin (NEURONTIN) 300 MG capsule Take one capsule three times daily 270 capsule 1  glucose blood (FREESTYLE TEST STRIPS) test strip Check blood sugar 3 to 4 times daily for medication regulation. 450 each PRN   insulin NPH-regular Human (NOVOLIN 70/30) (70-30) 100 UNIT/ML injection Inject 12-22 Units into the skin in the morning and at bedtime. 22 units in the AM, 12-15 units in the PM. 10 mL 99   latanoprost (XALATAN) 0.005 % ophthalmic solution Place 1 drop into both eyes at bedtime.   4   Magnesium 500 MG TABS Take 1 tablet (500 mg total) by mouth daily. 30 tablet    mycophenolate (MYFORTIC) 360 MG TBEC EC tablet TAKE 1 TABLET BY MOUTH TWICE DAILY 180 tablet 1   spironolactone (ALDACTONE) 25 MG tablet Take 0.5 tablets (12.5 mg total) by mouth daily. 45 tablet 3   tacrolimus (PROGRAF) 0.5 MG capsule Take 0.5 mg by mouth 2 (two) times daily. With '1mg'$  capsule for total of 1.'5mg'$  twice daily     tacrolimus (PROGRAF) 1 MG capsule Take 1 mg by mouth 2 (two) times daily. With  0.5g capsule for total of 1.'5mg'$  twice daily     tamsulosin (FLOMAX) 0.4 MG CAPS capsule Take 1 cap daily at night for slow urine stream/prostate. 90 capsule 3   traZODone (DESYREL) 50 MG tablet TAKE ONE TABLET BY MOUTH DAILY ONE HOUR BEFORE BEDTIME AS NEEDED FOR SLEEP 90 tablet 3   triamcinolone cream (KENALOG) 0.1 % Apply 1 application topically 2 (two) times daily. Apply  to rash  2 x /day 80 g 3   valsartan (DIOVAN)  80 MG tablet Take 1 tablet (80 mg total) by mouth daily. 90 tablet 3   warfarin (COUMADIN) 5 MG tablet Take  1 to 2  tablets  Daily  as directed to prevent blood clots                                                  /                                  TAKE                                       BY                                     MOUTH 180 tablet 3   Zinc 50 MG TABS Take 1 tablet Daily  0   No current facility-administered medications on file prior to visit.    Allergies: Allergies  Allergen Reactions   Losartan Anaphylaxis   Crestor [Rosuvastatin] Other (See Comments)    Elevated LFT's   Lorazepam     Pt is unsure of reaction    Morphine And Related Other (See Comments)    Has no effect on pt     Current Problems (verified) has Chronic combined systolic and diastolic CHF (congestive heart failure) ([place]); CKD stage 2 due to type 2 diabetes mellitus ([place]); Hyperlipidemia associated with type 2 diabetes mellitus ([place]); BPH with obstruction/lower urinary tract symptoms; Gout; A-fib ([place]); Essential hypertension; Vitamin D deficiency; Anticoagulant long-term use; Renal Transplant, s/p 09/2011; Depression, major, in  remission ([place]); Thrombocytopenia ([place]); CAD (coronary artery disease) of artery bypass graft; IgA nephropathy; Essential tremor; Overweight (BMI 25.0-29.9); Senile purpura ([place]); Diabetes mellitus type 2, controlled ([place]); Former smoker (quit 1992); HNP (herniated nucleus pulposus), cervical; S/P cervical spinal fusion; Chronic hip pain, bilateral; Hypercoagulopathy ([place]); Mobitz I; A-V fistula ([place]); First degree AV block; History of skin cancer; B12 deficiency; and Acquired dilation of ascending aorta and aortic root (HCC) on their problem list.  Surgical: He  has a past surgical history that includes Colonoscopy; Peritoneal catheter insertion (2011); AV fistula placement (2011); Kidney transplant (09/25/2011); Coronary angioplasty with stent; Coronary artery bypass graft (2013);  Anterior cervical decomp/discectomy fusion (N/A, 05/04/2018); and laparoscopic appendectomy (N/A, 04/06/2021). Family His family history includes Alcohol abuse in his brother and maternal grandfather; Alcoholism in his brother; Alzheimer's disease in his brother; COPD in his brother and mother; Diabetes in his mother; Heart attack in his father; Kidney disease in his sister. Social history  He reports that he quit smoking about 31 years ago. His smoking use included cigarettes. He started smoking about 56 years ago. He has a 48.00 pack-year smoking history. He has never used smokeless tobacco. He reports current alcohol use. He reports that he does not use drugs.   Review of Systems  Constitutional:  Negative for malaise/fatigue and weight loss.  HENT:  Negative for hearing loss and tinnitus.   Eyes:  Negative for blurred vision and double vision.  Respiratory:  Negative for sputum production, shortness of breath and wheezing.   Cardiovascular:  Negative for chest pain, palpitations, orthopnea, claudication, leg swelling and PND.  Gastrointestinal:  Negative for abdominal pain, blood in stool, constipation, diarrhea, heartburn, melena, nausea and vomiting.  Genitourinary: Negative.   Musculoskeletal:  Negative for falls, joint pain and myalgias.  Skin:  Negative for rash.  Neurological:  Negative for dizziness, tingling, sensory change, weakness and headaches.  Endo/Heme/Allergies:  Negative for environmental allergies and polydipsia.  Psychiatric/Behavioral: Negative.  Negative for depression, memory loss, substance abuse and suicidal ideas. The patient is not nervous/anxious and does not have insomnia.   All other systems reviewed and are negative.   Objective:   Today's Vitals   05/03/22 1030  BP: 130/70  Pulse: (!) 59  Temp: 97.7 F (36.5 C)  SpO2: 98%  Weight: 193 lb 6.4 oz (87.7 kg)  Height: '5\' 7"'$  (1.702 m)    Body mass index is 30.29 kg/m.  General Appearance: Well  nourished, in no apparent distress. Eyes: PERRLA, EOMs, conjunctiva no swelling or erythema Sinuses: No Frontal/maxillary tenderness ENT/Mouth: Ext aud canals clear, TMs without erythema, bulging. No erythema, swelling, or exudate on post pharynx.  Tonsils not swollen or erythematous. Hearing normal. Vocal quality not obviously hoarse.  Neck: Supple, thyroid normal.  Respiratory: Respiratory effort normal, BS equal bilaterally without rales, rhonchi, wheezing or stridor.  Cardio: Irregularly irregular, 3/6 early systolic blowing murmur. Brisk LE pulses, without edema. Has AV fistula to left forearm with thrill. Sensation intact, cap refill brisk Abdomen: Soft, + BS.  Non tender, no guarding, rebound, hernias, masses. Lymphatics: Non tender without lymphadenopathy.  Musculoskeletal: Full ROM, 5/5 strength, normal gait. Excepting right wrist in splint; distal sensation intact.  Skin: Warm, dry without rashes; small ecchymoses to bilateral upper extremities;  Neuro: Cranial nerves intact. Normal muscle tone, no cerebellar symptoms.  Psych: Awake and oriented X 3, normal affect, Insight and Judgment appropriate.    Darrol Jump, NP   05/03/2022

## 2022-05-03 NOTE — Patient Instructions (Signed)

## 2022-05-04 LAB — COMPLETE METABOLIC PANEL WITH GFR
AG Ratio: 1.8 (calc) (ref 1.0–2.5)
ALT: 10 U/L (ref 9–46)
AST: 17 U/L (ref 10–35)
Albumin: 3.7 g/dL (ref 3.6–5.1)
Alkaline phosphatase (APISO): 84 U/L (ref 35–144)
BUN/Creatinine Ratio: 30 (calc) — ABNORMAL HIGH (ref 6–22)
BUN: 37 mg/dL — ABNORMAL HIGH (ref 7–25)
CO2: 26 mmol/L (ref 20–32)
Calcium: 9.5 mg/dL (ref 8.6–10.3)
Chloride: 105 mmol/L (ref 98–110)
Creat: 1.22 mg/dL (ref 0.70–1.28)
Globulin: 2.1 g/dL (calc) (ref 1.9–3.7)
Glucose, Bld: 144 mg/dL — ABNORMAL HIGH (ref 65–99)
Potassium: 4.9 mmol/L (ref 3.5–5.3)
Sodium: 137 mmol/L (ref 135–146)
Total Bilirubin: 1.4 mg/dL — ABNORMAL HIGH (ref 0.2–1.2)
Total Protein: 5.8 g/dL — ABNORMAL LOW (ref 6.1–8.1)
eGFR: 62 mL/min/{1.73_m2} (ref >=60)

## 2022-05-04 LAB — CBC WITH DIFFERENTIAL/PLATELET
Absolute Monocytes: 452 cells/uL (ref 200–950)
Basophils Absolute: 41 cells/uL (ref 0–200)
Basophils Relative: 0.7 %
Eosinophils Absolute: 58 cells/uL (ref 15–500)
Eosinophils Relative: 1 %
HCT: 34.9 % — ABNORMAL LOW (ref 38.5–50.0)
Hemoglobin: 11.9 g/dL — ABNORMAL LOW (ref 13.2–17.1)
Lymphs Abs: 1096 cells/uL (ref 850–3900)
MCH: 32 pg (ref 27.0–33.0)
MCHC: 34.1 g/dL (ref 32.0–36.0)
MCV: 93.8 fL (ref 80.0–100.0)
MPV: 9.8 fL (ref 7.5–12.5)
Monocytes Relative: 7.8 %
Neutro Abs: 4153 cells/uL (ref 1500–7800)
Neutrophils Relative %: 71.6 %
Platelets: 117 10*3/uL — ABNORMAL LOW (ref 140–400)
RBC: 3.72 10*6/uL — ABNORMAL LOW (ref 4.20–5.80)
RDW: 12.5 % (ref 11.0–15.0)
Total Lymphocyte: 18.9 %
WBC: 5.8 10*3/uL (ref 3.8–10.8)

## 2022-05-04 LAB — LIPID PANEL
Cholesterol: 112 mg/dL (ref <200)
HDL: 59 mg/dL (ref >=40)
LDL Cholesterol (Calc): 42 mg/dL (calc)
Non-HDL Cholesterol (Calc): 53 mg/dL (calc) (ref <130)
Total CHOL/HDL Ratio: 1.9 (calc) (ref <5.0)
Triglycerides: 38 mg/dL (ref <150)

## 2022-05-04 LAB — PROTIME-INR
INR: 1.7 — ABNORMAL HIGH
Prothrombin Time: 17.2 s — ABNORMAL HIGH (ref 9.0–11.5)

## 2022-05-05 ENCOUNTER — Encounter: Payer: Self-pay | Admitting: Nurse Practitioner

## 2022-05-14 ENCOUNTER — Other Ambulatory Visit: Payer: Self-pay | Admitting: Cardiovascular Disease

## 2022-05-20 ENCOUNTER — Encounter: Payer: Self-pay | Admitting: Nurse Practitioner

## 2022-05-20 ENCOUNTER — Other Ambulatory Visit: Payer: Self-pay

## 2022-05-20 DIAGNOSIS — D696 Thrombocytopenia, unspecified: Secondary | ICD-10-CM

## 2022-05-20 DIAGNOSIS — E559 Vitamin D deficiency, unspecified: Secondary | ICD-10-CM

## 2022-05-20 DIAGNOSIS — I480 Paroxysmal atrial fibrillation: Secondary | ICD-10-CM

## 2022-05-20 DIAGNOSIS — R5383 Other fatigue: Secondary | ICD-10-CM

## 2022-05-20 DIAGNOSIS — Z79899 Other long term (current) drug therapy: Secondary | ICD-10-CM

## 2022-05-20 DIAGNOSIS — Z940 Kidney transplant status: Secondary | ICD-10-CM

## 2022-05-20 DIAGNOSIS — E1122 Type 2 diabetes mellitus with diabetic chronic kidney disease: Secondary | ICD-10-CM

## 2022-05-20 DIAGNOSIS — E538 Deficiency of other specified B group vitamins: Secondary | ICD-10-CM

## 2022-05-20 DIAGNOSIS — Z7901 Long term (current) use of anticoagulants: Secondary | ICD-10-CM

## 2022-05-20 DIAGNOSIS — I5042 Chronic combined systolic (congestive) and diastolic (congestive) heart failure: Secondary | ICD-10-CM

## 2022-05-20 DIAGNOSIS — Z1211 Encounter for screening for malignant neoplasm of colon: Secondary | ICD-10-CM

## 2022-05-20 DIAGNOSIS — R7989 Other specified abnormal findings of blood chemistry: Secondary | ICD-10-CM

## 2022-05-20 LAB — POC HEMOCCULT BLD/STL (HOME/3-CARD/SCREEN)
Card #2 Fecal Occult Blod, POC: NEGATIVE
Card #3 Fecal Occult Blood, POC: NEGATIVE
Fecal Occult Blood, POC: NEGATIVE

## 2022-05-22 ENCOUNTER — Ambulatory Visit (INDEPENDENT_AMBULATORY_CARE_PROVIDER_SITE_OTHER): Payer: Medicare Other | Admitting: Nurse Practitioner

## 2022-05-22 ENCOUNTER — Encounter: Payer: Self-pay | Admitting: Nurse Practitioner

## 2022-05-22 VITALS — BP 122/58 | HR 66 | Temp 97.5°F | Ht 67.0 in | Wt 192.8 lb

## 2022-05-22 DIAGNOSIS — E11649 Type 2 diabetes mellitus with hypoglycemia without coma: Secondary | ICD-10-CM

## 2022-05-22 DIAGNOSIS — N182 Chronic kidney disease, stage 2 (mild): Secondary | ICD-10-CM

## 2022-05-22 DIAGNOSIS — Z794 Long term (current) use of insulin: Secondary | ICD-10-CM

## 2022-05-22 DIAGNOSIS — E1122 Type 2 diabetes mellitus with diabetic chronic kidney disease: Secondary | ICD-10-CM | POA: Diagnosis not present

## 2022-05-22 NOTE — Patient Instructions (Signed)
Hypoglycemia Hypoglycemia occurs when the level of sugar (glucose) in the blood is too low. Hypoglycemia can happen in people who have or do not have diabetes. It can develop quickly, and it can be a medical emergency. For most people, a blood glucose level below 70 mg/dL (3.9 mmol/L) is considered hypoglycemia. Glucose is a type of sugar that provides the body's main source of energy. Certain hormones (insulin and glucagon) control the level of glucose in the blood. Insulin lowers blood glucose, and glucagon raises blood glucose. Hypoglycemia can result from having too much insulin in the bloodstream, or from not eating enough food that contains glucose. You may also have reactive hypoglycemia, which happens within 4 hours after eating a meal. What are the causes? Hypoglycemia occurs most often in people who have diabetes and may be caused by: Diabetes medicine. Not eating enough, or not eating often enough. Increased physical activity. Drinking alcohol on an empty stomach. If you do not have diabetes, hypoglycemia may be caused by: A tumor in the pancreas. Not eating enough, or not eating for long periods at a time (fasting). A severe infection or illness. Problems after having bariatric surgery. Organ failure, such as kidney or liver failure. Certain medicines. What increases the risk? Hypoglycemia is more likely to develop in people who: Have diabetes and take medicines to lower blood glucose. Abuse alcohol. Have a severe illness. What are the signs or symptoms? Symptoms vary depending on whether the condition is mild, moderate, or severe. Mild hypoglycemia Hunger. Sweating and feeling clammy. Dizziness or feeling light-headed. Sleepiness or restless sleep. Nausea. Increased heart rate. Headache. Blurry vision. Mood changes, such as irritability or anxiety. Tingling or numbness around the mouth, lips, or tongue. Moderate hypoglycemia Confusion and poor judgment. Behavior  changes. Weakness. Irregular heartbeat. A change in coordination. Severe hypoglycemia Severe hypoglycemia is a medical emergency. It can cause: Fainting. Seizures. Loss of consciousness (coma). Death. How is this diagnosed? Hypoglycemia is diagnosed with a blood test to measure your blood glucose level. This blood test is done while you are having symptoms. Your health care provider may also do a physical exam and review your medical history. How is this treated? This condition can be treated by immediately eating or drinking something that contains sugar with 15 grams of fast-acting carbohydrate, such as: 4 oz (120 mL) of fruit juice. 4 oz (120 mL) of regular soda (not diet soda). Several pieces of hard candy. Check food labels to find out how many pieces to eat for 15 grams. 1 Tbsp (15 mL) of sugar or honey. 4 glucose tablets. 1 tube of glucose gel. Treating hypoglycemia if you have diabetes If you are alert and able to swallow safely, follow the 15:15 rule: Take 15 grams of a fast-acting carbohydrate. Talk with your health care provider about how much you should take. Options for getting 15 grams of fast-acting carbohydrate include: Glucose tablets (take 4 tablets). Several pieces of hard candy. Check food labels to find out how many pieces to eat for 15 grams. 4 oz (120 mL) of fruit juice. 4 oz (120 mL) of regular soda (not diet soda). 1 Tbsp (15 mL) of sugar or honey. 1 tube of glucose gel. Check your blood glucose 15 minutes after you take the carbohydrate. If the repeat blood glucose level is still at or below 70 mg/dL (3.9 mmol/L), take 15 grams of a carbohydrate again. If your blood glucose level does not increase above 70 mg/dL (3.9 mmol/L) after 3 tries, seek emergency   medical care. After your blood glucose level returns to normal, eat a meal or a snack within 1 hour.  Treating severe hypoglycemia Severe hypoglycemia is when your blood glucose level is below 54 mg/dL (3  mmol/L). Severe hypoglycemia is a medical emergency. Get medical help right away. If you have severe hypoglycemia and you cannot eat or drink, you will need to be given glucagon. A family member or close friend should learn how to check your blood glucose and how to give you glucagon. Ask your health care provider if you need to have an emergency glucagon kit available. Severe hypoglycemia may need to be treated in a hospital. The treatment may include getting glucose through an IV. You may also need treatment for the cause of your hypoglycemia. Follow these instructions at home:  General instructions Take over-the-counter and prescription medicines only as told by your health care provider. Monitor your blood glucose as told by your health care provider. If you drink alcohol: Limit how much you have to: 0-1 drink a day for women who are not pregnant. 0-2 drinks a day for men. Know how much alcohol is in your drink. In the [place] one drink equals one 12 oz bottle of beer (355 mL), one 5 oz glass of wine (148 mL), or one 1 oz glass of hard liquor (44 mL). Be sure to eat food along with drinking alcohol. Be aware that alcohol is absorbed quickly and may have lingering effects that may result in hypoglycemia later. Be sure to do ongoing glucose monitoring. Keep all follow-up visits. This is important. If you have diabetes: Always have a fast-acting carbohydrate (15 grams) option with you to treat low blood glucose. Follow your diabetes management plan as directed by your health care provider. Make sure you: Know the symptoms of hypoglycemia. It is important to treat it right away to prevent it from becoming severe. Check your blood glucose as often as told. Always check before and after exercise. Always check your blood glucose before you drive a motorized vehicle. Take your medicines as told. Follow your meal plan. Eat on time, and do not skip meals. Share your diabetes management plan with  people in your workplace, school, and household. Carry a medical alert card or wear medical alert jewelry. Where to find more information American Diabetes Association: www.diabetes.org Contact a health care provider if: You have problems keeping your blood glucose in your target range. You have frequent episodes of hypoglycemia. Get help right away if: You continue to have hypoglycemia symptoms after eating or drinking something that contains 15 grams of fast-acting carbohydrate, and you cannot get your blood glucose above 70 mg/dL (3.9 mmol/L) while following the 15:15 rule. Your blood glucose is below 54 mg/dL (3 mmol/L). You have a seizure. You faint. These symptoms may represent a serious problem that is an emergency. Do not wait to see if the symptoms will go away. Get medical help right away. Call your local emergency services (911 in the [place] Do not drive yourself to the hospital. Summary Hypoglycemia occurs when the level of sugar (glucose) in the blood is too low. Hypoglycemia can happen in people who have or do not have diabetes. It can develop quickly, and it can be a medical emergency. Make sure you know the symptoms of hypoglycemia and how to treat it. Always have a fast-acting carbohydrate option with you to treat low blood sugar. This information is not intended to replace advice given to you by your health care provider.   Make sure you discuss any questions you have with your health care provider. Document Revised: 02/24/2020 Document Reviewed: 02/24/2020 Elsevier Patient Education  2023 [employer]  

## 2022-05-22 NOTE — Progress Notes (Signed)
Assessment and Plan:  Lorie was seen today for blood sugar problem.  Diagnoses and all orders for this visit:  Type 2 diabetes mellitus with stage 2 chronic kidney disease, with long-term current use of insulin (HCC) Continue medications: Novolin 70/30 22 units in am and 12-15 in PM Avoid sugars, limit simple carbs Continue diet and exercise.  Perform daily foot/skin check, notify office of any concerning changes.  -     COMPLETE METABOLIC PANEL WITH GFR  Hypoglycemia associated with type 2 diabetes mellitus ([place]) After reviewing history of event appears he had a high carb load Iate evening and tried to adjust with more insulin and then bottomed out early am Push fluids, avoid simple carbs and sugars Try to be stricter with diet Will check CMP today for glucose Continue to monitor      Further disposition pending results of labs. Discussed med's effects and SE's.   Over 30 minutes of exam, counseling, chart review, and critical decision making was performed.   Future Appointments  Date Time Provider [place]  05/27/2022  9:00 AM Nahser, Wonda Cheng, MD [facility]  06/06/2022  9:30 AM [name], MD [facility] None  07/05/2022  9:00 AM Darrol Jump, NP [facility] None  10/09/2022  9:00 AM Darrol Jump, NP [facility] None    ------------------------------------------------------------------------------------------------------------------   HPI BP (!) 122/58   Pulse 66   Temp (!) 97.5 F (36.4 C)   Ht 5' 7"$  (1.702 m)   Wt 192 lb 12.8 oz (87.5 kg)   SpO2 95%   BMI 30.20 kg/m   77 y.o.male presents for waking at 4:30 am and he had very low blood sugar of 51.  Was confused due to low blood sugar.  He ate scrambled eggs, toast and orange juice and blood sugar did raise to 109 and confusion resolved. He is currently on Novolin 70/30 22 units in AM and 12-15 units in PM. Fasting sugars 75-140. Yesterday had hamburger/potato casserole 5:30-6  o'clock He did have two scoops of ice cream at 10 o'clock PM. He took 18 units of insulin at 10 PM due to ice cream.  Lab Results  Component Value Date   HGBA1C 5.5 10/17/2021   Lab Results  Component Value Date   EGFR 62 05/03/2022     Bp is well controlled BP Readings from Last 3 Encounters:  05/22/22 (!) 122/58  05/03/22 130/70  04/05/22 (!) 142/80   BMI is Body mass index is 30.2 kg/m., he has been working on diet and exercise. Wt Readings from Last 3 Encounters:  05/22/22 192 lb 12.8 oz (87.5 kg)  05/03/22 193 lb 6.4 oz (87.7 kg)  04/05/22 190 lb 12.8 oz (86.5 kg)     Past Medical History:  Diagnosis Date   Adenomatous colon polyp    Allergy    BPH (benign prostatic hyperplasia)    CAD (coronary artery disease)    a. NSTEMI 09/2011 s/p PCI to LAD. b. CABG 01/2012 @ [place].   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   atorvastatin (LIPITOR) 40 MG tablet Take 1 tab daily for cholesterol.    brimonidine (ALPHAGAN) 0.2 % ophthalmic solution SMARTSIG:In Eye(s)   buPROPion (WELLBUTRIN XL) 300 MG 24 hr tablet Take  1 tablet  Daily for Mood, Focus & Concentration                                         /         TAKE                      BY                      MOUTH   finasteride (PROSCAR) 5 MG tablet Take  1 tablet  Daily  for Prostate   gabapentin (NEURONTIN) 300 MG capsule Take one capsule three times daily   glucose blood (FREESTYLE TEST STRIPS) test strip Check blood sugar 3 to 4 times daily for medication regulation.   insulin NPH-regular Human (NOVOLIN 70/30) (70-30) 100 UNIT/ML injection Inject 12-22 Units into the skin in the morning and at bedtime. 22 units in the AM, 12-15 units in the PM.   latanoprost (XALATAN) 0.005 % ophthalmic solution Place 1 drop into both eyes at bedtime.    Magnesium 500 MG TABS Take 1 tablet (500 mg total) by mouth daily.   mycophenolate (MYFORTIC) 360 MG TBEC EC tablet TAKE 1 TABLET BY MOUTH TWICE DAILY   spironolactone (ALDACTONE) 25 MG tablet Take 0.5 tablets (12.5 mg total) by mouth daily.   tacrolimus (PROGRAF) 0.5 MG capsule Take 0.5 mg by mouth 2 (two) times daily. With 58m capsule for total of 1.551mtwice daily   tacrolimus (PROGRAF) 1 MG capsule Take 1 mg by mouth 2 (two) times daily. With  0.5g capsule for total of 1.75m49mwice daily   tamsulosin (FLOMAX) 0.4 MG CAPS capsule Take 1 cap daily at night for slow urine stream/prostate.   traZODone (DESYREL) 50 MG tablet TAKE ONE TABLET BY MOUTH DAILY ONE HOUR BEFORE BEDTIME AS NEEDED FOR SLEEP   triamcinolone cream (KENALOG) 0.1 % Apply 1 application topically 2 (two) times daily. Apply  to rash  2 x /day   valsartan (DIOVAN) 80 MG tablet TAKE ONE TABLET BY MOUTH DAILY   warfarin (COUMADIN) 5 MG tablet Take  1 to 2  tablets  Daily  as directed to prevent blood clots                                                  /  TAKE                                       BY                                      MOUTH   Zinc 50 MG TABS Take 1 tablet Daily   cyclobenzaprine (FLEXERIL) 10 MG tablet Take 1 tablet (10 mg total) by mouth 3 (three) times daily as needed for muscle spasms. (Patient not taking: Reported on 05/22/2022)   No current facility-administered medications on file prior to visit.    ROS: all negative except above.   Physical Exam:  BP (!) 122/58   Pulse 66   Temp (!) 97.5 F (36.4 C)   Ht 5' 7"$  (1.702 m)   Wt 192 lb 12.8 oz (87.5 kg)   SpO2 95%   BMI 30.20 kg/m   General Appearance: Well nourished, in no apparent distress. Eyes: PERRLA, EOMs, conjunctiva no swelling or erythema Respiratory: Respiratory effort normal, BS equal bilaterally without rales, rhonchi, wheezing or stridor.  Cardio: RRR with no MRGs. Brisk peripheral pulses without edema.  Abdomen: Soft, + BS.  Non tender, no guarding, rebound, hernias, masses. Lymphatics: Non tender without lymphadenopathy.  Musculoskeletal: Full ROM, 5/5 strength, normal gait.  Skin: Warm, dry without rashes, lesions, ecchymosis.  Neuro: Cranial nerves intact. Normal muscle tone, no cerebellar symptoms. Sensation intact.  Psych: Awake and oriented X 3, normal affect, Insight and Judgment appropriate.     Alycia Rossetti, NP 12:03 PM [facility]

## 2022-05-23 ENCOUNTER — Ambulatory Visit (INDEPENDENT_AMBULATORY_CARE_PROVIDER_SITE_OTHER): Payer: Medicare Other

## 2022-05-23 DIAGNOSIS — I480 Paroxysmal atrial fibrillation: Secondary | ICD-10-CM

## 2022-05-23 DIAGNOSIS — E538 Deficiency of other specified B group vitamins: Secondary | ICD-10-CM

## 2022-05-23 DIAGNOSIS — Z79899 Other long term (current) drug therapy: Secondary | ICD-10-CM

## 2022-05-23 DIAGNOSIS — Z7901 Long term (current) use of anticoagulants: Secondary | ICD-10-CM

## 2022-05-23 DIAGNOSIS — R7989 Other specified abnormal findings of blood chemistry: Secondary | ICD-10-CM

## 2022-05-23 DIAGNOSIS — E559 Vitamin D deficiency, unspecified: Secondary | ICD-10-CM

## 2022-05-23 DIAGNOSIS — R5383 Other fatigue: Secondary | ICD-10-CM

## 2022-05-23 DIAGNOSIS — D696 Thrombocytopenia, unspecified: Secondary | ICD-10-CM

## 2022-05-23 LAB — COMPLETE METABOLIC PANEL WITH GFR
AG Ratio: 1.7 (calc) (ref 1.0–2.5)
ALT: 14 U/L (ref 9–46)
AST: 19 U/L (ref 10–35)
Albumin: 3.8 g/dL (ref 3.6–5.1)
Alkaline phosphatase (APISO): 93 U/L (ref 35–144)
BUN/Creatinine Ratio: 28 (calc) — ABNORMAL HIGH (ref 6–22)
BUN: 38 mg/dL — ABNORMAL HIGH (ref 7–25)
CO2: 27 mmol/L (ref 20–32)
Calcium: 9.4 mg/dL (ref 8.6–10.3)
Chloride: 106 mmol/L (ref 98–110)
Creat: 1.35 mg/dL — ABNORMAL HIGH (ref 0.70–1.28)
Globulin: 2.2 g/dL (calc) (ref 1.9–3.7)
Glucose, Bld: 167 mg/dL — ABNORMAL HIGH (ref 65–99)
Potassium: 5.1 mmol/L (ref 3.5–5.3)
Sodium: 139 mmol/L (ref 135–146)
Total Bilirubin: 0.9 mg/dL (ref 0.2–1.2)
Total Protein: 6 g/dL — ABNORMAL LOW (ref 6.1–8.1)
eGFR: 55 mL/min/{1.73_m2} — ABNORMAL LOW (ref >=60)

## 2022-05-23 NOTE — Progress Notes (Signed)
Patient presents to the office for labs. Patient has been suffering from severe fatigue. Was just seen in the office yesterday for Hypoglycemia with another provider.

## 2022-05-24 LAB — IRON,TIBC AND FERRITIN PANEL
%SAT: 39 % (calc) (ref 20–48)
Ferritin: 77 ng/mL (ref 24–380)
Iron: 114 ug/dL (ref 50–180)
TIBC: 289 mcg/dL (calc) (ref 250–425)

## 2022-05-24 LAB — CBC WITH DIFFERENTIAL/PLATELET
Absolute Monocytes: 402 cells/uL (ref 200–950)
Basophils Absolute: 39 cells/uL (ref 0–200)
Basophils Relative: 0.7 %
Eosinophils Absolute: 99 cells/uL (ref 15–500)
Eosinophils Relative: 1.8 %
HCT: 38.1 % — ABNORMAL LOW (ref 38.5–50.0)
Hemoglobin: 12.5 g/dL — ABNORMAL LOW (ref 13.2–17.1)
Lymphs Abs: 1243 cells/uL (ref 850–3900)
MCH: 31.1 pg (ref 27.0–33.0)
MCHC: 32.8 g/dL (ref 32.0–36.0)
MCV: 94.8 fL (ref 80.0–100.0)
MPV: 9.8 fL (ref 7.5–12.5)
Monocytes Relative: 7.3 %
Neutro Abs: 3718 cells/uL (ref 1500–7800)
Neutrophils Relative %: 67.6 %
Platelets: 124 10*3/uL — ABNORMAL LOW (ref 140–400)
RBC: 4.02 10*6/uL — ABNORMAL LOW (ref 4.20–5.80)
RDW: 13 % (ref 11.0–15.0)
Total Lymphocyte: 22.6 %
WBC: 5.5 10*3/uL (ref 3.8–10.8)

## 2022-05-24 LAB — VITAMIN D 25 HYDROXY (VIT D DEFICIENCY, FRACTURES): Vit D, 25-Hydroxy: 78 ng/mL (ref 30–100)

## 2022-05-24 LAB — TESTOSTERONE: Testosterone: 146 ng/dL — ABNORMAL LOW (ref 250–827)

## 2022-05-24 LAB — PROTIME-INR
INR: 2.1 — ABNORMAL HIGH
Prothrombin Time: 20.9 s — ABNORMAL HIGH (ref 9.0–11.5)

## 2022-05-24 LAB — TSH: TSH: 2.77 mIU/L (ref 0.40–4.50)

## 2022-05-24 LAB — VITAMIN B12: Vitamin B-12: 700 pg/mL (ref 200–1100)

## 2022-05-26 ENCOUNTER — Encounter: Payer: Self-pay | Admitting: Cardiovascular Disease

## 2022-05-26 NOTE — Progress Notes (Unsigned)
Cardiology Office Note:    Date:  05/27/2022   ID:  Robert Arnold., DOB Aug 23, 1945, MRN [id number]  PCP:  Robert Pinto, MD  Cardiologist:  Robert Moores, MD  Electrophysiologist:  None   Referring MD: Robert Pinto, MD   Chief Complaint  Patient presents with   Coronary Artery Disease   Atrial Fibrillation    Problem list 1.  Coronary artery disease-status post stenting ( September 26, 2011 and coronary artery bypass grafting -  ~ Oct. ,  2013  2.  Paroxysmal atrial fibrillation 3.  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 5.  Hyperlipidemia    Jan. 8 2019   Robert Arnold. is a 77 y.o. male with a hx of AV block . We are asked to see him today by Dr. Janit Arnold for further evaluation of this variable heart block.  He has a history of coronary artery disease.  The day following his kidney transplant he had a heart attack.  He had a stent placed and then subsequently had coronary artery bypass grafting.  He developed atrial fibrillation following his bypass grafting and continues to have paroxysmal atrial fibrillation.  He was started on Coumadin.  He has had his INR levels measured at Dr. Titus Arnold office.   Hx of kidney transplant 6 years ago.  Also has a history of insulin-dependent diabetes mellitus. He typically exercises quite a bit.  He is not exercised in the past several weeks because of the recent this recent finding of variable heart block. He was seen by Dr. Janit Arnold and the EKG suggested that he might have trifascicular heart block.  Denies any episodes of syncope or presyncope.  Feb. 4, 2022: Robert Arnold is seen today for follow up of his AV block , CAD Has some left rib pain , Not exertional , not associated with eating or drinking  Usually resolves with tylenol    Feb. 20, 2023 Robert Arnold is seen today for follow up of his AV block,  CAD  Was hospitalized in Jan with chest pain    Still has this L rib pain .  Worse with palpitation  Might last a day  Not associated with exercise   Echo recently showed an EF of 45-50%.   Mild - mod AI, mild AS Mild aortic dilatation (75m)   Feb. 19, 2024 Robert Musselis seen today for follow up of his AV block, CAD  He was seen last year for some chest pain Myoview showed a previous MI, no reversible ischemia  No cardiac complaints .  Exercising some .     No DOE with exercise   Last echocardiogram was from January, 2023.  His LVEF is 45 to 50%.  His previous echo performed in 2020 revealed EF of 50 to 55%.  We will discontinue his Diovan.  Will start him on Entresto 24-26 twice a day.  Will consider adding Jardiance at his next office visit.  I will see him again in 4 to 6 weeks for follow-up visit.  Will check basic metabolic profile at that time.   Past Medical History:  Diagnosis Date   Adenomatous colon polyp    Allergy    BPH (benign prostatic hyperplasia)    CAD (coronary artery disease)    a. NSTEMI 09/2011 s/p PCI [place]. 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   Trifascicular block     Past Surgical History:  Procedure Laterality Date   ANTERIOR CERVICAL DECOMP/DISCECTOMY FUSION N/A 05/04/2018   Procedure: Cervical Five-Six Anterior cervical decompression/discectomy/fusion;  Surgeon: Robert Gamma, MD;  Location: [facility];  Service:  Neurosurgery;  Laterality: N/A;  Cervical Five-Six Anterior cervical decompression/discectomy/fusion   AV FISTULA PLACEMENT  2011   Left forearm   COLONOSCOPY     CORONARY ANGIOPLASTY WITH STENT PLACEMENT     CORONARY ARTERY BYPASS GRAFT  2013   KIDNEY TRANSPLANT  09/25/2011   LAPAROSCOPIC APPENDECTOMY N/A 04/06/2021   Procedure: APPENDECTOMY LAPAROSCOPIC;  Surgeon: Robert Bookbinder, MD;  Location: [facility];  Service: General;  Laterality: N/A;   PERITONEAL CATHETER INSERTION  2011    Current Medications: Current Meds  Medication Sig   allopurinol (ZYLOPRIM) 300 MG tablet Take     1 tablet     Daily       to Prevent Gout   aspirin EC 81 MG tablet Take 81 mg by mouth daily.   atorvastatin (LIPITOR) 40 MG tablet Take 1 tab daily for cholesterol.   brimonidine (ALPHAGAN) 0.2 % ophthalmic solution SMARTSIG:In Eye(s)   buPROPion (WELLBUTRIN XL) 300 MG 24 hr tablet Take  1 tablet  Daily for Mood, Focus & Concentration                                         /         TAKE                      BY                      MOUTH   finasteride (PROSCAR) 5 MG tablet Take  1 tablet  Daily  for Prostate   gabapentin (NEURONTIN) 300 MG capsule Take 300 mg by mouth at bedtime.   glucose blood (FREESTYLE TEST STRIPS) test strip Check blood sugar 3 to 4 times daily for medication regulation.   insulin NPH-regular Human (NOVOLIN 70/30) (70-30) 100 UNIT/ML injection Inject 12-22 Units into the skin in the morning and at bedtime. 22 units in the AM, 12-15 units in the PM.   latanoprost (XALATAN) 0.005 % ophthalmic solution Place 1 drop into both eyes at bedtime.    Magnesium 500 MG TABS Take 1 tablet (500 mg total) by mouth daily.   mycophenolate (MYFORTIC) 360 MG TBEC EC tablet TAKE 1 TABLET BY MOUTH TWICE DAILY   sacubitril-valsartan (ENTRESTO) 24-26 MG Take 1 tablet by mouth 2 (two) times daily.   spironolactone (ALDACTONE) 25 MG tablet Take 0.5 tablets (12.5 mg total) by mouth daily.   tacrolimus (PROGRAF)  0.5 MG capsule Take 0.5 mg by mouth 2 (two) times daily. With 82m capsule  for total of 1.44m twice daily   tacrolimus (PROGRAF) 1 MG capsule Take 1 mg by mouth 2 (two) times daily. With  0.5g capsule for total of 1.555mtwice daily   tamsulosin (FLOMAX) 0.4 MG CAPS capsule Take 1 cap daily at night for slow urine stream/prostate.   traZODone (DESYREL) 50 MG tablet TAKE ONE TABLET BY MOUTH DAILY ONE HOUR BEFORE BEDTIME AS NEEDED FOR SLEEP   triamcinolone cream (KENALOG) 0.1 % Apply 1 application topically 2 (two) times daily. Apply  to rash  2 x /day   warfarin (COUMADIN) 5 MG tablet Take  1 to 2  tablets  Daily  as directed to prevent blood clots                                                  /                                  TAKE                                       BY                                     MOUTH   Zinc 50 MG TABS Take 1 tablet Daily   [DISCONTINUED] gabapentin (NEURONTIN) 300 MG capsule Take one capsule three times daily (Patient taking differently: Take 300 mg by mouth at bedtime. Take one capsule three times daily)   [DISCONTINUED] valsartan (DIOVAN) 80 MG tablet TAKE ONE TABLET BY MOUTH DAILY     Allergies:   Losartan, Crestor [rosuvastatin], Lorazepam, and Morphine and related   Social History   Socioeconomic History   Marital status: Married    Spouse name: Not on file   Number of children: 1   Years of education: Not on file   Highest education level: Not on file  Occupational History   Occupation: retired  Tobacco Use   Smoking status: Former    Packs/day: 2.00    Years: 24.00    Total pack years: 48.00    Types: Cigarettes    Start date: 1968    Quit date: 11/29/1990    Years since quitting: 31.5   Smokeless tobacco: Never  Vaping Use   Vaping Use: Never used  Substance and Sexual Activity   Alcohol use: Yes    Comment: 1-2 a month   Drug use: No   Sexual activity: Not on file  Other Topics Concern   Not on file  Social History Narrative   Not on  file   Social Determinants of Health   Financial Resource Strain: Not on file  Food Insecurity: Not on file  Transportation Needs: Not on file  Physical Activity: Not on file  Stress: Not on file  Social Connections: Not on file     Family History: The patient's family history includes Alcohol abuse in his brother and maternal grandfather; Alcoholism in his brother; Alzheimer's disease in his brother; COPD in his brother and mother; Diabetes in his mother; Heart attack in his father; Kidney disease in his sister. There is  no history of Colon cancer, Stomach cancer, Rectal cancer, Esophageal cancer, Liver cancer, or Pancreatic cancer.  ROS:   Please see the history of present illness.     All other systems reviewed and are negative.  EKGs/Labs/Other Studies Reviewed:    The following studies were reviewed today:     Recent Labs: 10/17/2021: Magnesium 1.9 05/22/2022: ALT 14; BUN 38; Creat 1.35; Potassium 5.1; Sodium 139 05/23/2022: Hemoglobin 12.5; Platelets 124; TSH 2.77  Recent Lipid Panel    Component Value Date/Time   CHOL 112 05/03/2022 0000   CHOL 158 03/15/2013 0744   TRIG 38 05/03/2022 0000   HDL 59 05/03/2022 0000   HDL 61 03/15/2013 0744   CHOLHDL 1.9 05/03/2022 0000   VLDL 17 09/09/2016 1042   LDLCALC 42 05/03/2022 0000    Physical Exam:    Physical Exam: Blood pressure 118/64, pulse 64, height 5' 8"$  (1.727 m), weight 189 lb 9.6 oz (86 kg), SpO2 97 %.       GEN:  Well nourished, well developed in no acute distress HEENT: Normal NECK: No JVD; soft Bilat carotid bruits ( left louder than Right) vs. Radiation of his AS murmur  LYMPHATICS: No lymphadenopathy CARDIAC: RRR  , + AS murmur  RESPIRATORY:  Clear to auscultation without rales, wheezing or rhonchi  ABDOMEN: Soft, non-tender, non-distended MUSCULOSKELETAL:  No edema; No deformity  SKIN: Warm and dry NEUROLOGIC:  Alert and oriented x 3   EKG:     May 27, 2022: Normal sinus rhythm at a heart  rate of 64.  First-degree AV block.  Right bundle branch block, left anterior fascicular block.  Minimal voltage criteria for left ventricular hypertrophy.  ASSESSMENT:    1. Coronary artery disease involving coronary bypass graft of native heart with other forms of angina pectoris ([place])   2. Chronic systolic heart failure (HCC)     PLAN:      First-degree AV block:    with RBBB and LAHB.   Stable.    2.  Coronary disease:    He is not having any episodes of angina.  3.  Chronic combined CHF: Last echocardiogram was from January, 2023.  His LVEF is 45 to 50%.  His previous echo performed in 2020 revealed EF of 50 to 55%.  We will discontinue his Diovan.  Will start him on Entresto 24-26 twice a day.  Will consider adding Jardiance at his next office visit.  I will see him again in 4 to 6 weeks for follow-up visit.  Will check basic metabolic profile at that time.   4.  Hyperlipidemia: Stable. Lipids look great   5.  Paroxysmal atrial fibrillation:    Is maintainins NSR   Medication Adjustments/Labs and Tests Ordered: Current medicines are reviewed at length with the patient today.  Concerns regarding medicines are outlined above.  Orders Placed This Encounter  Procedures   EKG 12-Lead   Meds ordered this encounter  Medications   sacubitril-valsartan (ENTRESTO) 24-26 MG    Sig: Take 1 tablet by mouth 2 (two) times daily.    Dispense:  180 tablet    Refill:  3     Patient Instructions  Medication Instructions:  Your physician has recommended you make the following change in your medication:   Stop Diovan Start Entresto 24-26 mg 2 times daily  *If you need a refill on your cardiac medications before your next appointment, please call your pharmacy*   Follow-Up: At [facility], you and your health  needs are our priority.  As part of our continuing mission to provide you with exceptional heart care, we have created designated Provider Care Teams.  These Care  Teams include your primary Cardiologist (physician) and Advanced Practice Providers (APPs -  Physician Assistants and Nurse Practitioners) who all work together to provide you with the care you need, when you need it.   Your next appointment:   4 - 6 week(s)  Provider:   Mertie Moores, MD        Signed, Robert Moores, MD  05/27/2022 7:46 PM    [place]

## 2022-05-27 ENCOUNTER — Ambulatory Visit: Payer: Medicare Other | Attending: Cardiovascular Disease | Admitting: Cardiovascular Disease

## 2022-05-27 ENCOUNTER — Encounter: Payer: Self-pay | Admitting: Cardiovascular Disease

## 2022-05-27 VITALS — BP 118/64 | HR 64 | Ht 68.0 in | Wt 189.6 lb

## 2022-05-27 DIAGNOSIS — I25708 Atherosclerosis of coronary artery bypass graft(s), unspecified, with other forms of angina pectoris: Secondary | ICD-10-CM | POA: Diagnosis present

## 2022-05-27 DIAGNOSIS — I5022 Chronic systolic (congestive) heart failure: Secondary | ICD-10-CM | POA: Diagnosis present

## 2022-05-27 MED ORDER — ENTRESTO 24-26 MG PO TABS: 1.0000 | ORAL_TABLET | Freq: Two times a day (BID) | ORAL | 3 refills | Status: DC

## 2022-05-27 NOTE — Patient Instructions (Signed)
Medication Instructions:  Your physician has recommended you make the following change in your medication:   Stop Diovan Start Entresto 24-26 mg 2 times daily  *If you need a refill on your cardiac medications before your next appointment, please call your pharmacy*   Follow-Up: At [facility], you and your health needs are our priority.  As part of our continuing mission to provide you with exceptional heart care, we have created designated Provider Care Teams.  These Care Teams include your primary Cardiologist (physician) and Advanced Practice Providers (APPs -  Physician Assistants and Nurse Practitioners) who all work together to provide you with the care you need, when you need it.   Your next appointment:   4 - 6 week(s)  Provider:   Mertie Moores, MD

## 2022-05-28 ENCOUNTER — Encounter: Payer: Self-pay | Admitting: Nurse Practitioner

## 2022-05-28 ENCOUNTER — Encounter: Payer: Self-pay | Admitting: Cardiovascular Disease

## 2022-06-05 ENCOUNTER — Encounter: Payer: Self-pay | Admitting: Internal Medicine

## 2022-06-05 NOTE — Patient Instructions (Signed)
Warfarin Information Warfarin is a blood thinner (anticoagulant). Anticoagulants help to prevent the formation of blood clots or keep them from getting bigger. Your health care provider will monitor the anticoagulation effect of warfarin closely and will adjust your medicine as needed. Who should use warfarin? Warfarin is prescribed for people who have blood clots, or who are at risk for developing harmful blood clots, such as people who: Have mechanical heart valves. Have irregular heart rhythms (atrial fibrillation). Have certain clotting disorders. Have had blood clots in the past or are currently receiving treatment for them. This includes people who have had a stroke, blood clots in the lungs (pulmonary embolism, or PE), or blood clots in the legs (deep vein thrombosis,or DVT). How is warfarin taken? Warfarin is taken by mouth (orally). Warfarin tablets come in different strengths. The strength is printed on the tablet, and each strength is a different color. If you get a new prescription and the color of your tablet is different than usual, tell your pharmacist or health care provider immediately. Take warfarin exactly as told by your health care provider, at the same time every day. Doing this helps you avoid bleeding or blood clots that could result in serious injury, pain, or disability. Contact your health care provider if a dose is forgotten or missed. Do not change or take additional dosesto make up for missed or accidental extra doses. What blood tests do I need while taking warfarin?  Warfarin is a medicine that needs to be closely monitored with blood tests. It is very important to keep all lab visits and follow-up visits with your health care provider. These tests measure the blood's ability to clot and are called prothrombin tests (PT)or international normalized ratio (INR) tests. These tests can be done with a finger stick or a blood draw. What does the INR test result mean? The PT  test results will be reported as the INR. Your health care provider will tell you your target INR range. If your INR is not in your target range, your health care provider may adjust your dosage. If your INR is above your target range, there is a risk of bleeding. Your dosage of warfarin may need to be decreased. If your INR is below your target range, there is a risk of clotting. Your dosage of warfarin may need to be increased. How often is the INR test needed? When you first start warfarin, you will usually have your INR checked every few days until the health care provider determines the correct dosage of warfarin. After you have reached your target INR, your INR will be tested less often. However, you will need to have your INR checked at least once every 4-6 weeks while you take warfarin. Some people may be able to use home monitoring to check their INR. Ask your health care provider if this applies to you. What are the side effects of warfarin? Too much warfarin can cause bleeding or hemorrhage in any part of the body, such as: Bleeding from the gums. Unexplained bruises or bruises that get larger. A nosebleed that is not easily stopped. Bleeding in the brain (hemorrhagic stroke). Coughing up or vomiting blood. Blood in the urine or stools. Warfarin may also cause: Skin rash or irritations. Nausea that does not go away. Severe pain in the back or joints. Painful toes that turn blue or purple (purple toe syndrome). Painful ulcers that do not go away (skin necrosis). What precautions do I need to take while using warfarin?  Wear a medical alert bracelet or carry a card that lists what medicines you take. Make sure that all health care providers, including your dentist, know you are taking warfarin. Avoid situations that cause bleeding by: Using a softer toothbrush. Flossing with waxed floss. Shaving with an Copy, not with a blade. Limiting your use of sharp  objects. Avoiding activities that put you at risk for injury, such as contact sports. What do I need to know about warfarin and pregnancy or breastfeeding? If you are taking warfarin and you become pregnant, or plan to become pregnant, contact your health care provider right away. Though warfarin has been associated with birth defects, it can be used in some cases after weighing risks to mother and baby. If you plan to breastfeed while taking warfarin, talk with your health care provider first. What do I need to know about warfarin and alcohol or drug use? Do not drink alcohol if: Your health care provider tells you not to drink. You are pregnant, may be pregnant, or are planning to become pregnant. If you drink alcohol: Limit how much you have to: 0-1 drink a day for women. 0-2 drinks a day for men. Know how much alcohol is in your drink. In the [place] one drink equals one 12 oz bottle of beer (355 mL), one 5 oz glass of wine (148 mL), or one 1 oz glass of hard liquor (44 mL). If you change the amount of alcohol that you drink, tell your health care provider. Your warfarin dosage may need to be changed. Do not use any products that contain nicotine or tobacco. These products include cigarettes, chewing tobacco, and vaping devices, such as e-cigarettes. If you need help quitting, ask your health care provider. If you use nicotine or tobacco products and change the amount that you use, tell your health care provider. Your warfarin dosage may need to be changed. Avoid drug use while taking warfarin. The effects of drugs on warfarin are not known. What do I need to know about warfarin and other medicines or supplements? Many prescription and over-the-counter medicines can interfere with warfarin. Talk with your health care provider or your pharmacist before starting or stopping any new medicines. This includes vitamins, herbs, supplements, and pain medicines. Some common over-the-counter medicines  that may increase the risk of dangerous bleeding while taking warfarin include: Aspirin. NSAIDs, such as ibuprofen or naproxen. Vitamin E. Fish oils. What do I need to know about warfarin and my diet? Vitamin K decreases the effect of warfarin, and it is found in many foods. Eat a consistent amount of foods that contain vitamin K. For example, you may decide to eat 2 servings of vitamin K-containing foods each day. It is important to maintain a normal, balanced diet while taking warfarin. Avoid major changes in your diet. If you are going to change your diet, talk with your health care provider before making changes. Your health care provider may recommend that you work with a dietitian. Contact a health care provider if you: Miss a dose. Take an extra dose. Plan to have any kind of surgery or procedure. Ask whether you should stop taking warfarin or change your dose before your surgery. Are unable to take your medicine due to nausea, vomiting, or diarrhea. Have any major changes in your diet, or you plan to make major changes in your diet. Start or stop any over-the-counter medicine, prescription medicine, herbal supplement, or dietary supplement. Become pregnant, plan to become pregnant, or think you  may be pregnant. Have menstrual periods that are heavier than usual. Have unusual bruising. Get help right away if you: Have signs of an allergic reaction, such as: Swelling of the lips, face, tongue, mouth, or throat. Rash or itchy, red, swollen areas of skin (hives). Trouble breathing. Chest tightness. Fall or have an accident, especially if you hit your head. Have signs that your blood is too thin, such as: Blood in your urine. Your urine may look reddish, pinkish, or tea-colored. Blood in your stool. Your stool may be black or bright red. Coughing up or vomiting blood. The blood may be bright red, or it may look like coffee grounds. Bleeding that does not stop after applying pressure  to the area for 30 minutes. Have signs of a blood clot in your leg or arm, such as: Pain or swelling in your leg or arm. Skin that is red or warm to the touch on your arm or leg. Have signs of blood in your lung, such as: Shortness of breath or difficulty breathing. Chest pain. Unexplained fever. Have any symptoms of a stroke. "BE FAST" is an easy way to remember the main warning signs of a stroke: B - Balance. Signs are dizziness, sudden trouble walking, or loss of balance. E - Eyes. Signs are trouble seeing or a sudden change in vision. F - Face. Signs are sudden weakness or numbness of the face, or the face or eyelid drooping on one side. A - Arms. Signs are weakness or numbness in an arm. This happens suddenly and usually on one side of the body. S - Speech. Signs are sudden trouble speaking, slurred speech, or trouble understanding what people say. T - Time. Time to call emergency services. Write down what time symptoms started. Have other signs of a stroke, such as: A sudden, severe headache with no known cause. Nausea or vomiting. Seizure. Have other signs of a reaction to warfarin, such as: Purple or blue toes. Skin ulcers that do not go away. These symptoms may represent a serious problem that is an emergency. Do not wait to see if the symptoms will go away. Get medical help right away. Call your local emergency services (911 in the [place] Do not drive yourself to the hospital. Summary Warfarin is a medicine that thins blood. It is used to prevent or treat blood clots. You must be monitored closely while on this medicine. Keep all follow-up visits. Make sure that you know your target INR range and your warfarin dosage. Wear or carry identification that says you are taking warfarin. Take warfarin at the same time every day. Call your health care provider if you miss a dose or if you take an extra dose. Do not change the dosage of warfarin on your own. Know the signs and symptoms  of blood clots, bleeding, and a stroke. Know when to get emergency medical help. This information is not intended to replace advice given to you by your health care provider. Make sure you discuss any questions you have with your health care provider. Document Revised: 06/14/2020 Document Reviewed: 06/14/2020 Elsevier Patient Education  [place].

## 2022-06-05 NOTE — Progress Notes (Signed)
Future Appointments  Date Time Provider Department  06/06/2022  9:30 AM [name], MD [facility]  07/05/2022  9:00 AM Darrol Jump, NP [facility]  07/08/2022 11:00 AM Nahser, Wonda Cheng, MD [facility]  10/09/2022  9:00 AM Darrol Jump, NP [facility]    History of Present Illness:      This very nice nice 77 y.o. MWM  presents for Coag  follow up with HTN, ASHD - MI in June 2013-->>CABG /pAfib, HLD and Vitamin D Deficiency.    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 He was switched to Insulin in 2013 post transplant.       136/70.  After his Kidney Transplant in 2013, he had a po acute MI with PCA/Stenting and later CABG .  Patient 's BP is at goal. Patient  has had no complaints of any cardiac type chest pain, palpitations, dyspnea Vertell Limber /PND, dizziness, claudication or dependent edema.                                                  He also has hx/o Afib and is on coumadin . Patient has had no complaints of any cardiac type chest pain, palpitations, dyspnea / orthopnea / PND, dizziness, claudication, or dependent edema.     Current Outpatient Medications on File Prior to Visit  Medication Sig   allopurinol (ZYLOPRIM) 300 MG tablet Take     1 tablet     Daily       to Prevent Gout   aspirin EC 81 MG tablet Take 81 mg by mouth daily.   atorvastatin (LIPITOR) 40 MG tablet Take 1 tab daily for cholesterol.   brimonidine (ALPHAGAN) 0.2 % ophthalmic solution SMARTSIG:In Eye(s)   buPROPion (WELLBUTRIN XL) 300 MG 24 hr tablet Take  1 tablet  Daily for Mood, Focus & Concentration                                         /         TAKE                      BY                      MOUTH   finasteride (PROSCAR) 5 MG tablet Take  1 tablet  Daily  for Prostate   gabapentin (NEURONTIN) 300 MG capsule Take 300 mg by mouth at bedtime.   glucose blood (FREESTYLE TEST STRIPS) test strip Check blood  sugar 3 to 4 times daily for medication regulation.   insulin NPH-regular Human (NOVOLIN 70/30) (70-30) 100 UNIT/ML injection Inject 12-22 Units into the skin in the morning and at bedtime. 22 units in the AM, 12-15 units in the PM.   latanoprost (XALATAN) 0.005 % ophthalmic solution Place 1 drop into both eyes at bedtime.    Magnesium 500 MG TABS Take 1 tablet (500 mg total) by mouth daily.   mycophenolate (MYFORTIC) 360 MG TBEC EC tablet TAKE 1 TABLET BY MOUTH TWICE DAILY   sacubitril-valsartan (ENTRESTO) 24-26 MG Take 1 tablet by mouth 2 (two) times daily.  spironolactone (ALDACTONE) 25 MG tablet Take 0.5 tablets (12.5 mg total) by mouth daily.   tacrolimus (PROGRAF) 0.5 MG capsule Take 0.5 mg by mouth 2 (two) times daily. With '1mg'$  capsule for total of 1.'5mg'$  twice daily   tacrolimus (PROGRAF) 1 MG capsule Take 1 mg by mouth 2 (two) times daily. With  0.5g capsule for total of 1.'5mg'$  twice daily   tamsulosin (FLOMAX) 0.4 MG CAPS capsule Take 1 cap daily at night for slow urine stream/prostate.   traZODone (DESYREL) 50 MG tablet TAKE ONE TABLET BY MOUTH DAILY ONE HOUR BEFORE BEDTIME AS NEEDED FOR SLEEP   triamcinolone cream (KENALOG) 0.1 % Apply 1 application topically 2 (two) times daily. Apply  to rash  2 x /day   warfarin (COUMADIN) 5 MG tablet Take  1 to 2  tablets  Daily  as directed to prevent blood clots                                                  /                                  TAKE                                       BY                                     MOUTH   Zinc 50 MG TABS Take 1 tablet Daily     Allergies  Allergen Reactions   Losartan Anaphylaxis   Crestor [Rosuvastatin] Other (See Comments)    Elevated LFT's   Lorazepam     Pt is unsure of reaction    Morphine And Related Other (See Comments)    Has no effect on pt      Problem list He has Chronic combined systolic and diastolic CHF (congestive heart failure) ([place]); CKD stage 2 due to type 2 diabetes  mellitus ([place]); Hyperlipidemia associated with type 2 diabetes mellitus ([place]); BPH with obstruction/lower urinary tract symptoms; Gout; A-fib ([place]); Essential hypertension; Vitamin D deficiency; Anticoagulant long-term use; Renal Transplant, s/p 09/2011; Depression, major, in remission ([place]); Thrombocytopenia ([place]); CAD (coronary artery disease) of artery bypass graft; IgA nephropathy; Essential tremor; Overweight (BMI 25.0-29.9); Senile purpura ([place]); Diabetes mellitus type 2, controlled ([place]); Former smoker (quit 1992); HNP (herniated nucleus pulposus), cervical; S/P cervical spinal fusion; Chronic hip pain, bilateral; Hypercoagulopathy ([place]); Mobitz I; A-V fistula ([place]); First degree AV block; History of skin cancer; B12 deficiency; and Acquired dilation of ascending aorta and aortic root (HCC) on their problem list.   Observations/Objective:  BP 136/70   Pulse 71   Temp (!) 97 F (36.1 C)   Ht '5\' 7"'$  (1.702 m)   Wt 187 lb 3.2 oz (84.9 kg)   SpO2 99%   BMI 29.32 kg/m   HEENT - WNL. Neck - supple.  Chest - Clear equal BS. Cor - Nl HS. RRR w/o sig MGR. PP 1(+). No edema. MS- FROM w/o deformities.  Gait Nl. Neuro -  Nl w/o focal abnormalities.   Assessment and Plan:  1. Essential hypertension  - CBC with Differential/Platelet - COMPLETE METABOLIC PANEL WITH GFR - Protime-INR  2. Type 2 diabetes mellitus with stage 2 chronic kidney  disease, with long-term current use of insulin (HCC)  - COMPLETE METABOLIC PANEL WITH GFR  3. Renal transplant, status post  - COMPLETE METABOLIC PANEL WITH GFR  4. Chronic atrial fibrillation (HCC)  - Protime-INR  5. Anticoagulant long-term use  - Protime-INR  6. Medication management  - CBC with Differential/Platelet - COMPLETE METABOLIC PANEL WITH GFR    Follow Up Instructions:        I discussed the assessment and treatment plan with the patient. The patient was provided an opportunity to ask questions and all were answered. The  patient agreed with the plan and demonstrated an understanding of the instructions.       The patient was advised to call back or seek an in-person evaluation if the symptoms worsen or if the condition fails to improve as anticipated.    Kirtland Bouchard, MD

## 2022-06-06 ENCOUNTER — Encounter: Payer: Self-pay | Admitting: Internal Medicine

## 2022-06-06 ENCOUNTER — Ambulatory Visit (INDEPENDENT_AMBULATORY_CARE_PROVIDER_SITE_OTHER): Payer: Medicare Other | Admitting: Internal Medicine

## 2022-06-06 VITALS — BP 136/70 | HR 71 | Temp 97.0°F | Ht 67.0 in | Wt 187.2 lb

## 2022-06-06 DIAGNOSIS — Z940 Kidney transplant status: Secondary | ICD-10-CM | POA: Diagnosis not present

## 2022-06-06 DIAGNOSIS — N182 Chronic kidney disease, stage 2 (mild): Secondary | ICD-10-CM

## 2022-06-06 DIAGNOSIS — E1122 Type 2 diabetes mellitus with diabetic chronic kidney disease: Secondary | ICD-10-CM

## 2022-06-06 DIAGNOSIS — Z7901 Long term (current) use of anticoagulants: Secondary | ICD-10-CM

## 2022-06-06 DIAGNOSIS — I10 Essential (primary) hypertension: Secondary | ICD-10-CM

## 2022-06-06 DIAGNOSIS — Z794 Long term (current) use of insulin: Secondary | ICD-10-CM

## 2022-06-06 DIAGNOSIS — Z79899 Other long term (current) drug therapy: Secondary | ICD-10-CM

## 2022-06-06 DIAGNOSIS — I482 Chronic atrial fibrillation, unspecified: Secondary | ICD-10-CM | POA: Diagnosis not present

## 2022-06-07 LAB — COMPLETE METABOLIC PANEL WITH GFR
AG Ratio: 1.8 (calc) (ref 1.0–2.5)
ALT: 9 U/L (ref 9–46)
AST: 14 U/L (ref 10–35)
Albumin: 4 g/dL (ref 3.6–5.1)
Alkaline phosphatase (APISO): 85 U/L (ref 35–144)
BUN/Creatinine Ratio: 27 (calc) — ABNORMAL HIGH (ref 6–22)
BUN: 34 mg/dL — ABNORMAL HIGH (ref 7–25)
CO2: 27 mmol/L (ref 20–32)
Calcium: 9.5 mg/dL (ref 8.6–10.3)
Chloride: 105 mmol/L (ref 98–110)
Creat: 1.27 mg/dL (ref 0.70–1.28)
Globulin: 2.2 g/dL (calc) (ref 1.9–3.7)
Glucose, Bld: 82 mg/dL (ref 65–99)
Potassium: 4.8 mmol/L (ref 3.5–5.3)
Sodium: 139 mmol/L (ref 135–146)
Total Bilirubin: 1.4 mg/dL — ABNORMAL HIGH (ref 0.2–1.2)
Total Protein: 6.2 g/dL (ref 6.1–8.1)
eGFR: 59 mL/min/{1.73_m2} — ABNORMAL LOW (ref >=60)

## 2022-06-07 LAB — CBC WITH DIFFERENTIAL/PLATELET
Absolute Monocytes: 427 cells/uL (ref 200–950)
Basophils Absolute: 32 cells/uL (ref 0–200)
Basophils Relative: 0.6 %
Eosinophils Absolute: 113 cells/uL (ref 15–500)
Eosinophils Relative: 2.1 %
HCT: 37.3 % — ABNORMAL LOW (ref 38.5–50.0)
Hemoglobin: 12.6 g/dL — ABNORMAL LOW (ref 13.2–17.1)
Lymphs Abs: 1080 cells/uL (ref 850–3900)
MCH: 32.1 pg (ref 27.0–33.0)
MCHC: 33.8 g/dL (ref 32.0–36.0)
MCV: 95.2 fL (ref 80.0–100.0)
MPV: 9.6 fL (ref 7.5–12.5)
Monocytes Relative: 7.9 %
Neutro Abs: 3748 cells/uL (ref 1500–7800)
Neutrophils Relative %: 69.4 %
Platelets: 120 10*3/uL — ABNORMAL LOW (ref 140–400)
RBC: 3.92 10*6/uL — ABNORMAL LOW (ref 4.20–5.80)
RDW: 13 % (ref 11.0–15.0)
Total Lymphocyte: 20 %
WBC: 5.4 10*3/uL (ref 3.8–10.8)

## 2022-06-07 LAB — PROTIME-INR
INR: 2 — ABNORMAL HIGH
Prothrombin Time: 20.4 s — ABNORMAL HIGH (ref 9.0–11.5)

## 2022-06-07 NOTE — Progress Notes (Signed)
<><><><><><><><><><><><><><><><><><><><><><><><><><><><><><><><><> <><><><><><><><><><><><><><><><><><><><><><><><><><><><><><><><><> -   Test results slightly outside the reference range are not unusual. If there is anything important, I will review this with you,  otherwise it is considered normal test values.  If you have further questions,  please do not hesitate to contact me at the office or via My Chart.  <><><><><><><><><><><><><><><><><><><><><><><><><><><><><><><><><> <><><><><><><><><><><><><><><><><><><><><><><><><><><><><><><><><>  -  Protime / INR = 2.0 X - Great   !             -      Please keep coumadin dose  Same  <><><><><><><><><><><><><><><><><><><><><><><><><><><><><><><><><>  -  Creatinine a little better than 2 weeks ago - Down from 1.35 to now 1.27  And   - GFR Stable at 59   Also  CBC Stable & OK  <><><><><><><><><><><><><><><><><><><><><><><><><><><><><><><><><> <><><><><><><><><><><><><><><><><><><><><><><><><><><><><><><><><>

## 2022-06-13 ENCOUNTER — Other Ambulatory Visit: Payer: Self-pay | Admitting: Nephrology

## 2022-06-13 ENCOUNTER — Encounter: Payer: Self-pay | Admitting: Internal Medicine

## 2022-06-13 DIAGNOSIS — Z940 Kidney transplant status: Secondary | ICD-10-CM

## 2022-06-26 ENCOUNTER — Other Ambulatory Visit: Payer: Self-pay

## 2022-06-26 DIAGNOSIS — E1169 Type 2 diabetes mellitus with other specified complication: Secondary | ICD-10-CM

## 2022-06-26 DIAGNOSIS — N138 Other obstructive and reflux uropathy: Secondary | ICD-10-CM

## 2022-06-26 MED ORDER — TAMSULOSIN HCL 0.4 MG PO CAPS: ORAL_CAPSULE | ORAL | 3 refills | Status: DC

## 2022-06-26 MED ORDER — ATORVASTATIN CALCIUM 40 MG PO TABS: ORAL_TABLET | ORAL | 3 refills | Status: DC

## 2022-07-03 ENCOUNTER — Ambulatory Visit
Admission: RE | Admit: 2022-07-03 | Discharge: 2022-07-03 | Disposition: A | Discharge status: Home / Self Care | Payer: Medicare Other | Source: Ambulatory Visit | Attending: Nephrology | Admitting: Nephrology

## 2022-07-03 ENCOUNTER — Other Ambulatory Visit: Payer: Self-pay | Admitting: Nephrology

## 2022-07-03 DIAGNOSIS — Z940 Kidney transplant status: Secondary | ICD-10-CM

## 2022-07-05 ENCOUNTER — Encounter: Payer: Medicare Other | Admitting: Nurse Practitioner

## 2022-07-07 ENCOUNTER — Encounter: Payer: Self-pay | Admitting: Cardiovascular Disease

## 2022-07-07 NOTE — Progress Notes (Unsigned)
Cardiology Office Note:    Date:  07/08/2022   ID:  Robert Cockayne., DOB [birth year], MRN [id number]  PCP:  [name], MD  Cardiologist:  Mertie Moores, MD  Electrophysiologist:  None   Referring MD: [name], MD   Chief Complaint  Patient presents with   Coronary Artery Disease        Atrial Fibrillation         Problem list 1.  Coronary artery disease-status post stenting ( September 26, 2011 and coronary artery bypass grafting -  ~ Oct. ,  2013  2.  Paroxysmal atrial fibrillation 3.  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 5.  Hyperlipidemia    Jan. 8 2019   Robert Zamorski. is a 77 y.o. male with a hx of AV block . We are asked to see him today by Dr. Janit Pagan for further evaluation of this variable heart block.  He has a history of coronary artery disease.  The day following his kidney transplant he had a heart attack.  He had a stent placed and then subsequently had coronary artery bypass grafting.  He developed atrial fibrillation following his bypass grafting and continues to have paroxysmal atrial fibrillation.  He was started on Coumadin.  He has had his INR levels measured at Dr. Titus Mould office.   Hx of kidney transplant 6 years ago.  Also has a history of insulin-dependent diabetes mellitus. He typically exercises quite a bit.  He is not exercised in the past several weeks because of the recent this recent finding of variable heart block. He was seen by Dr. Janit Pagan and the EKG suggested that he might have trifascicular heart block.  Denies any episodes of syncope or presyncope.  Feb. 4, 2022: Robert Arnold is seen today for follow up of his AV block , CAD Has some left rib pain , Not exertional , not associated with eating or drinking  Usually resolves with tylenol    Feb. 20, 2023 Robert Arnold is seen today for follow up of his  AV block, CAD  Was hospitalized in Jan with chest pain    Still has this L rib pain .  Worse with palpitation  Might last a day  Not associated with exercise   Echo recently showed an EF of 45-50%.   Mild - mod AI, mild AS Mild aortic dilatation (21mm)   Feb. 19, 2024 Robert Arnold is seen today for follow up of his AV block, CAD  He was seen last year for some chest pain Myoview showed a previous MI, no reversible ischemia  No cardiac complaints .  Exercising some .     No DOE with exercise   Last echocardiogram was from January, 2023.  His LVEF is 45 to 50%.  His previous echo performed in 2020 revealed EF of 50 to 55%.  We will discontinue his Diovan.  Will start him on Entresto 24-26 twice a day.  Will consider adding Jardiance at his next office visit.  I will see him again in 4 to 6 weeks for follow-up visit.  Will check basic metabolic profile at that time.   July 08, 2022 Robert Arnold is seen for follow up of his CAD , AV block Mild combined CHF  Was started on Entresto at his last visit but he cannot afford it  Has switched back to generic Valsartan Will  consider Farxiga 10 mg a day    Past Medical History:  Diagnosis Date   Adenomatous colon polyp    Allergy    BPH (benign prostatic hyperplasia)    CAD (coronary artery disease)    a. NSTEMI 09/2011 s/p PCI to LAD. b. CABG 01/2012 @ [place].   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 for prophylaxis   Ischemic cardiomyopathy    Mild aortic stenosis    Mild dilation of ascending aorta    NSTEMI (non-ST elevated myocardial infarction) 11/26/2013   2013    OSA (obstructive sleep apnea)    PAF (paroxysmal atrial fibrillation)    Testosterone deficiency 02/27/2018   Trifascicular block     Past Surgical History:  Procedure  Laterality Date   ANTERIOR CERVICAL DECOMP/DISCECTOMY FUSION N/A 05/04/2018   Procedure: Cervical Five-Six Anterior cervical decompression/discectomy/fusion;  Surgeon: Jovita Gamma, MD;  Location: [facility];  Service: Neurosurgery;  Laterality: N/A;  Cervical Five-Six Anterior cervical decompression/discectomy/fusion   AV FISTULA PLACEMENT  2011   Left forearm   COLONOSCOPY     CORONARY ANGIOPLASTY WITH STENT PLACEMENT     CORONARY ARTERY BYPASS GRAFT  2013   KIDNEY TRANSPLANT  09/25/2011   LAPAROSCOPIC APPENDECTOMY N/A 04/06/2021   Procedure: APPENDECTOMY LAPAROSCOPIC;  Surgeon: Rolm Bookbinder, MD;  Location: [facility];  Service: General;  Laterality: N/A;   PERITONEAL CATHETER INSERTION  2011    Current Medications: Current Meds  Medication Sig   allopurinol (ZYLOPRIM) 300 MG tablet Take     1 tablet     Daily       to Prevent Gout   aspirin EC 81 MG tablet Take 81 mg by mouth daily.   atorvastatin (LIPITOR) 40 MG tablet Take 1 tab daily for cholesterol.   brimonidine (ALPHAGAN) 0.2 % ophthalmic solution SMARTSIG:In Eye(s)   buPROPion (WELLBUTRIN XL) 300 MG 24 hr tablet Take  1 tablet  Daily for Mood, Focus & Concentration                                         /         TAKE                      BY                      MOUTH   dapagliflozin propanediol (FARXIGA) 10 MG TABS tablet Take 1 tablet (10 mg total) by mouth daily before breakfast.   finasteride (PROSCAR) 5 MG tablet Take  1 tablet  Daily  for Prostate   gabapentin (NEURONTIN) 300 MG capsule Take 300 mg by mouth at bedtime.   glucose blood (FREESTYLE TEST STRIPS) test strip Check blood sugar 3 to 4 times daily for medication regulation.   insulin NPH-regular Human (NOVOLIN 70/30) (70-30) 100 UNIT/ML injection Inject 12-22 Units into the skin in the morning and at bedtime. 22 units in the AM, 12-15 units in the PM.   latanoprost (XALATAN) 0.005 % ophthalmic solution Place 1 drop into both eyes at bedtime.    Magnesium 500 MG  TABS Take  1 tablet (500 mg total) by mouth daily.   mycophenolate (MYFORTIC) 360 MG TBEC EC tablet TAKE 1 TABLET BY MOUTH TWICE DAILY   spironolactone (ALDACTONE) 25 MG tablet Take 0.5 tablets (12.5 mg total) by mouth daily.   tacrolimus (PROGRAF) 0.5 MG capsule Take 0.5 mg by mouth 2 (two) times daily. With 1mg  capsule for total of 1.5mg  twice daily   tacrolimus (PROGRAF) 1 MG capsule Take 1 mg by mouth 2 (two) times daily. With  0.5g capsule for total of 1.5mg  twice daily   tamsulosin (FLOMAX) 0.4 MG CAPS capsule Take 1 cap daily at night for slow urine stream/prostate.   traZODone (DESYREL) 50 MG tablet TAKE ONE TABLET BY MOUTH DAILY ONE HOUR BEFORE BEDTIME AS NEEDED FOR SLEEP   triamcinolone cream (KENALOG) 0.1 % Apply 1 application topically 2 (two) times daily. Apply  to rash  2 x /day   valsartan (DIOVAN) 80 MG tablet Take 1 tablet (80 mg total) by mouth daily.   warfarin (COUMADIN) 5 MG tablet Take  1 to 2  tablets  Daily  as directed to prevent blood clots                                                  /                                  TAKE                                       BY                                     MOUTH   Zinc 50 MG TABS Take 1 tablet Daily     Allergies:   Losartan, Crestor [rosuvastatin], Lorazepam, and Morphine and related   Social History   Socioeconomic History   Marital status: Married    Spouse name: Not on file   Number of children: 1   Years of education: Not on file   Highest education level: Not on file  Occupational History   Occupation: retired  Tobacco Use   Smoking status: Former    Packs/day: 2.00    Years: 24.00    Additional pack years: 0.00    Total pack years: 48.00    Types: Cigarettes    Start date: 1968    Quit date: 11/29/1990    Years since quitting: 31.6   Smokeless tobacco: Never  Vaping Use   Vaping Use: Never used  Substance and Sexual Activity   Alcohol use: Yes    Comment: 1-2 a month   Drug use: No   Sexual  activity: Not on file  Other Topics Concern   Not on file  Social History Narrative   Not on file   Social Determinants of Health   Financial Resource Strain: Not on file  Food Insecurity: Not on file  Transportation Needs: Not on file  Physical Activity: Not on file  Stress: Not on file  Social Connections: Not on file     Family History: The patient's family history includes Alcohol  abuse in his brother and maternal grandfather; Alcoholism in his brother; Alzheimer's disease in his brother; COPD in his brother and mother; Diabetes in his mother; Heart attack in his father; Kidney disease in his sister. There is no history of Colon cancer, Stomach cancer, Rectal cancer, Esophageal cancer, Liver cancer, or Pancreatic cancer.  ROS:   Please see the history of present illness.     All other systems reviewed and are negative.  EKGs/Labs/Other Studies Reviewed:    The following studies were reviewed today:     Recent Labs: 10/17/2021: Magnesium 1.9 05/23/2022: TSH 2.77 06/06/2022: ALT 9; BUN 34; Creat 1.27; Hemoglobin 12.6; Platelets 120; Potassium 4.8; Sodium 139  Recent Lipid Panel    Component Value Date/Time   CHOL 112 05/03/2022 0000   CHOL 158 03/15/2013 0744   TRIG 38 05/03/2022 0000   HDL 59 05/03/2022 0000   HDL 61 03/15/2013 0744   CHOLHDL 1.9 05/03/2022 0000   VLDL 17 09/09/2016 1042   LDLCALC 42 05/03/2022 0000    Physical Exam:     Physical Exam: Blood pressure 138/70, pulse 63, height 5\' 8"  (1.727 m), weight 185 lb 9.6 oz (84.2 kg), SpO2 97 %.       GEN:  Well nourished, well developed in no acute distress HEENT: Normal NECK: No JVD; left carotid bruit vs. Radiation of systolic murmur  LYMPHATICS: No lymphadenopathy CARDIAC: RRR  2/6 systolic murmur  RESPIRATORY:  Clear to auscultation without rales, wheezing or rhonchi  ABDOMEN: Soft, non-tender, non-distended MUSCULOSKELETAL:  No edema; No deformity  SKIN: Warm and dry NEUROLOGIC:  Alert and  oriented x 3    EKG:        ASSESSMENT:    1. Carotid bruit, unspecified laterality   2. Chronic combined systolic and diastolic heart failure      PLAN:      First-degree AV block:    with RBBB and LAHB.        2.  Coronary disease:   no angina     3.  Chronic combined CHF: We tried Entresto but it was too expensive for him.  He is back on valsartan 80 mg a day.  Will add Farxiga 10 mg a day.  We have given him the precautions to watch for.   4.  Hyperlipidemia: Labs from May 03, 2022 reveal  total cholesterol of 112 HDL is 59  LDL is 42 Triglyceride level is 38 Continue current medications.   5.  Paroxysmal atrial fibrillation:   on warfarin , INR levels are measured through Dr. Melford Aase .   6.  Mild aortic stenosis with mild aortic insufficiency.  Stable.  He has a soft murmur.  It tends to radiate up to his left carotid.    Medication Adjustments/Labs and Tests Ordered: Current medicines are reviewed at length with the patient today.  Concerns regarding medicines are outlined above.  Orders Placed This Encounter  Procedures   VAS US CAROTID   Meds ordered this encounter  Medications   valsartan (DIOVAN) 80 MG tablet    Sig: Take 1 tablet (80 mg total) by mouth daily.    Dispense:  90 tablet    Refill:  3   dapagliflozin propanediol (FARXIGA) 10 MG TABS tablet    Sig: Take 1 tablet (10 mg total) by mouth daily before breakfast.    Dispense:  90 tablet    Refill:  3    Please fill generic and can do 90 days if cheaper  Patient Instructions  Medication Instructions:  START Farxiga 10mg  daily CONTINUE Valsartan 80mg  daily *If you need a refill on your cardiac medications before your next appointment, please call your pharmacy*  Lab Work: NONE If you have labs (blood work) drawn today and your tests are completely normal, you will receive your results only by: [place] (if you have MyChart) OR A paper copy in the mail If you have any  lab test that is abnormal or we need to change your treatment, we will call you to review the results.  Testing/Procedures: Carotid Ultrasound Your physician has requested that you have a carotid duplex. This test is an ultrasound of the carotid arteries in your neck. It looks at blood flow through these arteries that supply the brain with blood. Allow one hour for this exam. There are no restrictions or special instructions.  Follow-Up: At [facility], you and your health needs are our priority.  As part of our continuing mission to provide you with exceptional heart care, we have created designated Provider Care Teams.  These Care Teams include your primary Cardiologist (physician) and Advanced Practice Providers (APPs -  Physician Assistants and Nurse Practitioners) who all work together to provide you with the care you need, when you need it.  Your next appointment:   6 month(s)  Provider:   Mertie Moores, MD        Signed, Mertie Moores, MD  07/08/2022 1:40 PM    Robert Arnold

## 2022-07-08 ENCOUNTER — Encounter: Payer: Self-pay | Admitting: Cardiovascular Disease

## 2022-07-08 ENCOUNTER — Ambulatory Visit: Payer: Medicare Other | Attending: Cardiovascular Disease | Admitting: Cardiovascular Disease

## 2022-07-08 VITALS — BP 138/70 | HR 63 | Ht 68.0 in | Wt 185.6 lb

## 2022-07-08 DIAGNOSIS — R0989 Other specified symptoms and signs involving the circulatory and respiratory systems: Secondary | ICD-10-CM | POA: Insufficient documentation

## 2022-07-08 DIAGNOSIS — I5042 Chronic combined systolic (congestive) and diastolic (congestive) heart failure: Secondary | ICD-10-CM | POA: Insufficient documentation

## 2022-07-08 MED ORDER — DAPAGLIFLOZIN PROPANEDIOL 10 MG PO TABS: 10.0000 mg | ORAL_TABLET | Freq: Every day | ORAL | 3 refills | Status: DC

## 2022-07-08 MED ORDER — VALSARTAN 80 MG PO TABS: 80.0000 mg | ORAL_TABLET | Freq: Every day | ORAL | 3 refills | Status: DC

## 2022-07-08 NOTE — Patient Instructions (Signed)
Medication Instructions:  START Farxiga 10mg  daily CONTINUE Valsartan 80mg  daily *If you need a refill on your cardiac medications before your next appointment, please call your pharmacy*  Lab Work: NONE If you have labs (blood work) drawn today and your tests are completely normal, you will receive your results only by: [place] (if you have MyChart) OR A paper copy in the mail If you have any lab test that is abnormal or we need to change your treatment, we will call you to review the results.  Testing/Procedures: Carotid Ultrasound Your physician has requested that you have a carotid duplex. This test is an ultrasound of the carotid arteries in your neck. It looks at blood flow through these arteries that supply the brain with blood. Allow one hour for this exam. There are no restrictions or special instructions.  Follow-Up: At [facility], you and your health needs are our priority.  As part of our continuing mission to provide you with exceptional heart care, we have created designated Provider Care Teams.  These Care Teams include your primary Cardiologist (physician) and Advanced Practice Providers (APPs -  Physician Assistants and Nurse Practitioners) who all work together to provide you with the care you need, when you need it.  Your next appointment:   6 month(s)  Provider:   Mertie Moores, MD

## 2022-07-09 ENCOUNTER — Encounter: Payer: Self-pay | Admitting: Cardiovascular Disease

## 2022-07-16 ENCOUNTER — Ambulatory Visit (INDEPENDENT_AMBULATORY_CARE_PROVIDER_SITE_OTHER): Payer: Medicare Other | Admitting: Nurse Practitioner

## 2022-07-16 ENCOUNTER — Encounter: Payer: Self-pay | Admitting: Nurse Practitioner

## 2022-07-16 VITALS — BP 130/70 | HR 55 | Temp 97.8°F | Ht 67.0 in | Wt 184.8 lb

## 2022-07-16 DIAGNOSIS — M1000 Idiopathic gout, unspecified site: Secondary | ICD-10-CM

## 2022-07-16 DIAGNOSIS — Z0001 Encounter for general adult medical examination with abnormal findings: Secondary | ICD-10-CM

## 2022-07-16 DIAGNOSIS — I10 Essential (primary) hypertension: Secondary | ICD-10-CM | POA: Diagnosis not present

## 2022-07-16 DIAGNOSIS — N138 Other obstructive and reflux uropathy: Secondary | ICD-10-CM

## 2022-07-16 DIAGNOSIS — E663 Overweight: Secondary | ICD-10-CM

## 2022-07-16 DIAGNOSIS — E559 Vitamin D deficiency, unspecified: Secondary | ICD-10-CM

## 2022-07-16 DIAGNOSIS — I5042 Chronic combined systolic (congestive) and diastolic (congestive) heart failure: Secondary | ICD-10-CM

## 2022-07-16 DIAGNOSIS — D696 Thrombocytopenia, unspecified: Secondary | ICD-10-CM

## 2022-07-16 DIAGNOSIS — Z79899 Other long term (current) drug therapy: Secondary | ICD-10-CM

## 2022-07-16 DIAGNOSIS — E1169 Type 2 diabetes mellitus with other specified complication: Secondary | ICD-10-CM

## 2022-07-16 DIAGNOSIS — Z940 Kidney transplant status: Secondary | ICD-10-CM

## 2022-07-16 DIAGNOSIS — I480 Paroxysmal atrial fibrillation: Secondary | ICD-10-CM

## 2022-07-16 DIAGNOSIS — E538 Deficiency of other specified B group vitamins: Secondary | ICD-10-CM

## 2022-07-16 DIAGNOSIS — E1122 Type 2 diabetes mellitus with diabetic chronic kidney disease: Secondary | ICD-10-CM

## 2022-07-16 DIAGNOSIS — Z7901 Long term (current) use of anticoagulants: Secondary | ICD-10-CM

## 2022-07-16 DIAGNOSIS — F325 Major depressive disorder, single episode, in full remission: Secondary | ICD-10-CM

## 2022-07-16 DIAGNOSIS — I440 Atrioventricular block, first degree: Secondary | ICD-10-CM

## 2022-07-16 DIAGNOSIS — D692 Other nonthrombocytopenic purpura: Secondary | ICD-10-CM

## 2022-07-16 DIAGNOSIS — G8929 Other chronic pain: Secondary | ICD-10-CM

## 2022-07-16 DIAGNOSIS — Z85828 Personal history of other malignant neoplasm of skin: Secondary | ICD-10-CM

## 2022-07-16 NOTE — Progress Notes (Signed)
CPE Assessment:   Annual CPE Due annually  Health maintenance reviewed  Essential hypertension Discussed DASH (Dietary Approaches to Stop Hypertension) DASH diet is lower in sodium than a typical American diet. Cut back on foods that are high in saturated fat, cholesterol, and trans fats. Eat more whole-grain foods, fish, poultry, and nuts Remain active and exercise as tolerated daily.  Monitor BP at home-Call if greater than 130/80.  Check CMP/CBC   PAF (paroxysmal atrial fibrillation) (HCC) INR therapeutic. Check INR q month and will adjust medication according to labs.  Discussed if patient falls to immediately contact office or go to ER. Discussed foods that can increase or decrease Coumadin levels. Patient understands to call the office before starting a new medication. Follow up in one month.   Chronic systolic heart failure (HCC) Follows with cardiology  Control blood pressure, cholesterol, glucose, increase exercise.   Weights are down, appears euvolemic  Thrombocytopenia (HCC) Secondary to chronic anticoagulation medication Check CBC at routine visits  Gout due to renal impairment, unspecified chronicity, unspecified site No recent flare Continue medications. Recheck Uric acid as needed,  Low purine diet discussed  Vitamin D deficiency Continue supplement for goal of 60-100 Monitor Vitamin D levels  Medication management All medications discussed and reviewed in full. All questions and concerns regarding medications addressed.    Long term current use of anticoagulant therapy Monitor INR Falls risks discussed  Benign prostatic hyperplasia with lower urinary tract symptoms Continue medications Monitor BPH  Type 2 Diabetes Mellitus Continue to f/u with Marcelline DeistFarxiga assistance Education: Reviewed 'ABCs' of diabetes management  Discussed goals to be met and/or maintained include A1C (<7) Blood pressure (<130/80) Cholesterol (LDL <70) Continue Eye Exam  yearly  Continue Dental Exam Q6 mo Discussed dietary recommendations Discussed Physical Activity recommendations Foot exam UTD Check A1C   CKD stage 2 due to type 2 diabetes mellitus (HCC) Discussed how what you eat and drink can aide in kidney protection. Stay well hydrated. Avoid high salt foods. Avoid NSAIDS. Keep BP and BG well controlled.   Take medications as prescribed. Remain active and exercise as tolerated daily. Maintain weight.  Continue to monitor. Check CMP/GFR/Microablumin  Mild episode of recurrent major depressive disorder, in remission (HCC) Continue medications; may try reducing wellbutrin dose, sleep hygiene reviewed, keep log Lifestyle discussed: diet/exerise, sleep hygiene, stress management, hydration  Overweight  Discussed appropriate BMI Diet modification. Physical activity. Encouraged/praised to build confidence.  Chronic bilateral hip pain  Improved; monitoring  Hx of renal transplant; AV fistula (HCC) S/p transplant in 2013, doing well since on on myfortic and prograf (follows with [facility]), Dr. Arbie CookeyEarly PRN for retained AV fistula   Senile purpura (HCC) R/t comadin, monitor, protect skin   History of skin cancer Unsure what type, non melanoma; protect skin, sunscreen encouraged; derm following   First degree AV block Monitor EKG Continue to follow with Cardiology  Hyperlipidemia with DM2 Discussed lifestyle modifications. Recommended diet heavy in fruits and veggies, omega 3's. Decrease consumption of animal meats, cheeses, and dairy products. Remain active and exercise as tolerated. Continue to monitor. Check lipids/TSH  B12 deficiency Monitor levels Continue supplement  Future Appointments  Date Time Provider Department Center  07/16/2022  9:00 AM Adela Glimpseranford, Wardell Pokorski, NP [facility] None  07/23/2022  8:30 AM [facility]  08/06/2022 10:30 AM Lucky CowboyMcKeown, William, MD [facility] None  10/09/2022  9:00 AM Adela Glimpseranford,  [facility], NP [facility] None  07/16/2023  9:00 AM Adela Glimpseranford, Tatijana Bierly, NP [facility] None  Plan:   During the course of the visit the patient was educated and counseled about appropriate screening and preventive services including:   Pneumococcal vaccine  Influenza vaccine Prevnar 13 Td vaccine Screening electrocardiogram Colorectal cancer screening Diabetes screening Glaucoma screening Nutrition counseling    Subjective:  Robert Arnold. is a 77 y.o. male who presents for AWV and 1 month OV for coumadin.    Overall Robert Arnold reports doing well.  Robert Arnold is continuing to remain active and play gold.  Robert Arnold has no additional concerns in clinic today.    Robert Arnold underwent C6-7 disc decompression and fusion in 04/2018 by Dr. Bettina Gavia and feels fully resolved without any issues.Robert Arnold has chronic bilateral hip pain but improves with steroid injection, recently improved.   Robert Arnold has history of depression, well controlled with wellbutrin 300 mg daily and trazodone 50 mg at night. Robert Arnold notes has had some more difficulty falling asleep, but does wake up feeling refreshed.   BMI is Body mass index is 28.94 kg/m., Robert Arnold has been working on diet,  Wt Readings from Last 3 Encounters:  07/16/22 184 lb 12.8 oz (83.8 kg)  07/08/22 185 lb 9.6 oz (84.2 kg)  06/06/22 187 lb 3.2 oz (84.9 kg)   Robert Arnold has p. A. Fib, on coumadin; hx of CAD s/p stenting 09/2011 and CABG 02/2012, has done well since. 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  Robert Arnold has hx of systolic CHF, improved/resolved on most recent ECHO 04/2018 however with diastolic dysfunction and LVEF 16-10%. Robert Arnold developed atrial fibrillation following his bypass grafting and continues to have paroxysmal atrial fibrillation. Robert Arnold was started on Coumadin. Follows with Dr. Elease Hashimoto.   Robert Arnold had a recent follow up with Dr. Elease Hashimoto 07/08/22 for CAD and Atrial Fibrillation.  Robert Arnold was asked to f/u at the request of Dr. Oneta Rack for further evaluation of variable heart block and reports of fatigue.    Of note, Robert Arnold  has a history of coronary artery disease. The day following his kidney transplant Robert Arnold had a heart attack. Robert Arnold had a stent placed and then subsequently had coronary artery bypass grafting.   Robert Arnold was started on Entresto but was unable to afford and switched back to generic Valsartan 80 mg.  Considered Farxiga 10 mg.  Robert Arnold planned to move forward with a  Carotid US.  Robert Arnold has this scheduled on 07/23/22.  Robert Arnold has history of Afib and is on coumadin, Robert Arnold is on 5 mg other days, within goal range; no nose bleeds, blood stool/urine. No missed doses, no ABX.  Lab Results  Component Value Date   INR 2.0 (H) 06/06/2022   INR 2.1 (H) 05/23/2022   INR 1.7 (H) 05/03/2022   His blood pressure has been controlled at home, today their BP is BP: 130/70 Robert Arnold does workout, walking 4 days/week. Robert Arnold denies chest pain, shortness of breath, dizziness.    Robert Arnold is on cholesterol medication (atorvastatin 40 mg daily) and denies myalgias. His cholesterol is at goal. The cholesterol last visit was:   Lab Results  Component Value Date   CHOL 112 05/03/2022   HDL 59 05/03/2022   LDLCALC 42 05/03/2022   TRIG 38 05/03/2022   CHOLHDL 1.9 05/03/2022    Robert Arnold has been working on diet and exercise for well controlled T2 diabetes on novolin 70/30 22 units AM, 12-15 units units at night, and denies increased appetite, nausea, paresthesia of the feet, polydipsia, polyuria and visual disturbances.  Reports fasting around 90-110. Does have occasional low glucose, always in the  afternoon, none in several months.  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  On prograf and myfortic, follows  kidney  Now back in CKD 2 stage;  Lab Results  Component Value Date   GFRNONAA >60 04/10/2021   Patient is on Vitamin D supplement, alternates 10000 IU and 5000 IU every other  day.  Lab Results  Component Value Date   VD25OH 78 05/23/2022     Patient is on allopurinol for gout and does not report a recent flare.  Lab Results  Component Value Date   LABURIC 5.1 10/17/2021   Robert Arnold notes stream is slow to start; denies nocturia, straining, dribbling. Declined med and monitoring for now.  Lab Results  Component Value Date   PSA <0.04 07/04/2021   PSA <0.1 04/05/2019   PSA <0.1 03/03/2018   Medication Review: Current Outpatient Medications on File Prior to Visit  Medication Sig Dispense Refill   allopurinol (ZYLOPRIM) 300 MG tablet Take     1 tablet     Daily       to Prevent Gout 90 tablet 3   Ascorbic Acid (VITAMIN C) 1000 MG tablet Take 1,000 mg by mouth daily.     aspirin EC 81 MG tablet Take 81 mg by mouth daily.     atorvastatin (LIPITOR) 40 MG tablet Take 1 tab daily for cholesterol. 90 tablet 3   brimonidine (ALPHAGAN) 0.2 % ophthalmic solution SMARTSIG:In Eye(s)     buPROPion (WELLBUTRIN XL) 300 MG 24 hr tablet Take  1 tablet  Daily for Mood, Focus & Concentration                                         /         TAKE                      BY                      MOUTH 90 tablet 3   dapagliflozin propanediol (FARXIGA) 10 MG TABS tablet Take 1 tablet (10 mg total) by mouth daily before breakfast. 90 tablet 3   finasteride (PROSCAR) 5 MG tablet Take  1 tablet  Daily  for Prostate 90 tablet 3   gabapentin (NEURONTIN) 300 MG capsule Take 300 mg by mouth at bedtime.     glucose blood (FREESTYLE TEST STRIPS) test strip Check blood sugar 3 to 4 times daily for medication regulation. 450 each PRN   insulin NPH-regular Human (NOVOLIN 70/30) (70-30) 100 UNIT/ML injection Inject 12-22 Units into the skin in the morning and at bedtime. 22 units in the AM, 12-15 units in the PM. 10 mL 99   latanoprost (XALATAN) 0.005 % ophthalmic solution Place 1 drop into both eyes at bedtime.   4   Magnesium 500 MG TABS Take 1 tablet (500 mg total) by mouth daily. 30 tablet     mycophenolate (MYFORTIC) 360 MG TBEC EC tablet TAKE 1 TABLET BY MOUTH TWICE DAILY 180 tablet 1   spironolactone (ALDACTONE) 25 MG tablet Take 0.5 tablets (12.5 mg total) by mouth daily. 45 tablet 3   tacrolimus (PROGRAF) 0.5 MG capsule Take  0.5 mg by mouth 2 (two) times daily. With 1mg  capsule for total of 1.5mg  twice daily     tacrolimus (PROGRAF) 1 MG capsule Take 1 mg by mouth 2 (two) times daily. With  0.5g capsule for total of 1.5mg  twice daily     tamsulosin (FLOMAX) 0.4 MG CAPS capsule Take 1 cap daily at night for slow urine stream/prostate. 90 capsule 3   traZODone (DESYREL) 50 MG tablet TAKE ONE TABLET BY MOUTH DAILY ONE HOUR BEFORE BEDTIME AS NEEDED FOR SLEEP 90 tablet 3   triamcinolone cream (KENALOG) 0.1 % Apply 1 application topically 2 (two) times daily. Apply  to rash  2 x /day 80 g 3   valsartan (DIOVAN) 80 MG tablet Take 1 tablet (80 mg total) by mouth daily. 90 tablet 3   warfarin (COUMADIN) 5 MG tablet Take  1 to 2  tablets  Daily  as directed to prevent blood clots                                                  /                                  TAKE                                       BY                                     MOUTH 180 tablet 3   Zinc 50 MG TABS Take 1 tablet Daily  0   No current facility-administered medications on file prior to visit.    Allergies: Allergies  Allergen Reactions   Losartan Anaphylaxis   Crestor [Rosuvastatin] Other (See Comments)    Elevated LFT's   Lorazepam     Pt is unsure of reaction    Morphine And Related Other (See Comments)    Has no effect on pt     Current Problems (verified) has Chronic combined systolic and diastolic CHF (congestive heart failure) (HCC); CKD stage 2 due to type 2 diabetes mellitus; Hyperlipidemia associated with type 2 diabetes mellitus; BPH with obstruction/lower urinary tract symptoms; Gout; A-fib; Essential hypertension; Vitamin D deficiency; Anticoagulant long-term use; Renal Transplant, s/p 09/2011;  Depression, major, in remission; Thrombocytopenia; CAD (coronary artery disease) of artery bypass graft; IgA nephropathy; Essential tremor; Overweight (BMI 25.0-29.9); Senile purpura; Diabetes mellitus type 2, controlled; Former smoker (quit 1992); HNP (herniated nucleus pulposus), cervical; S/P cervical spinal fusion; Chronic hip pain, bilateral; Hypercoagulopathy; Mobitz I; A-V fistula; First degree AV block; History of skin cancer; B12 deficiency; and Acquired dilation of ascending aorta and aortic root on their problem list.  Screening Tests Immunization History  Administered Date(s) Administered   DT (Pediatric) 06/05/2015   Hepatitis B 04/08/2009   Influenza, High Dose Seasonal PF 12/27/2013, 01/17/2015, 01/11/2016, 01/02/2018, 12/21/2018, 01/31/2020, 01/15/2021, 01/04/2022   Influenza-Unspecified 12/25/2016   PFIZER(Purple Top)SARS-COV-2 Vaccination 07/24/2019, 08/17/2019, 03/14/2020   Pneumococcal Conjugate-13 10/25/2013   Pneumococcal Polysaccharide-23 06/05/2015, 11/30/2020   Pneumococcal-Unspecified 04/08/2008   Tdap 04/08/2008   Zoster Recombinat (Shingrix) 02/12/2018, 06/02/2018  Preventative care: Last colonoscopy: 01/2021, 5 year follow up, 08/2025 CT AB 2012 CT chest 2010 Sleep study 2013 Echo 02/2013, 04/2018, normal systolic, diastolic dysfunction, LVEF 50-55% Stress test 01/2012  CXR 10/2016  Prior vaccinations: TD or Tdap: 2017  Influenza: Due 01/2022 Pneumococcal: 2017  Prevnar13: 2015 Shingles/Zostavax: had 2/2, shingrix  Covid 19: had 3/3, 2021, pfizer + 2 boosters - requested send information   Names of Other Physician/Practitioners you currently use: 1. [facility] here for primary care 2. Dr. Lorin Picket, [facility], last visit 12/15/2021, DM no retinopathy, follows q79m due to glaucoma 3. Dr. Rocco Pauls, dentist, last visit, 2024 q 6 months 4. [facility], last 2023  Patient Care Team: [name],  MD as PCP - General (Internal Medicine) Nahser, Deloris Ping, MD as PCP - Cardiology (Cardiology) Fredrich Birks, OD as Referring Physician (Optometry) Bufford Buttner, MD as Consulting Physician (Nephrology) Armbruster, Willaim Rayas, MD as Consulting Physician (Gastroenterology) Aris Lot, MD as Consulting Physician (Dermatology)  Surgical: Robert Arnold  has a past surgical history that includes Colonoscopy; Peritoneal catheter insertion (2011); AV fistula placement (2011); Kidney transplant (09/25/2011); Coronary angioplasty with stent; Coronary artery bypass graft (2013); Anterior cervical decomp/discectomy fusion (N/A, 05/04/2018); and laparoscopic appendectomy (N/A, 04/06/2021). Family His family history includes Alcohol abuse in his brother and maternal grandfather; Alcoholism in his brother; Alzheimer's disease in his brother; COPD in his brother and mother; Diabetes in his mother; Heart attack in his father; Kidney disease in his sister. Social history  Robert Arnold reports that Robert Arnold quit smoking about 31 years ago. His smoking use included cigarettes. Robert Arnold started smoking about 56 years ago. Robert Arnold has a 48.00 pack-year smoking history. Robert Arnold has never used smokeless tobacco. Robert Arnold reports current alcohol use. Robert Arnold reports that Robert Arnold does not use drugs.  Depression/mood screen:      06/09/2022    9:18 PM  Depression screen PHQ 2/9  Decreased Interest 0  Down, Depressed, Hopeless 0  PHQ - 2 Score 0     Review of Systems  Constitutional:  Negative for malaise/fatigue and weight loss.  HENT:  Negative for hearing loss and tinnitus.   Eyes:  Negative for blurred vision and double vision.  Respiratory:  Negative for cough, sputum production, shortness of breath and wheezing.   Cardiovascular:  Negative for chest pain, palpitations, orthopnea, claudication, leg swelling and PND.  Gastrointestinal:  Negative for abdominal pain, blood in stool, constipation, diarrhea, heartburn, melena, nausea and vomiting.  Genitourinary:  Negative.   Musculoskeletal:  Negative for falls, joint pain and myalgias.  Skin:  Negative for rash.  Neurological:  Negative for dizziness, tingling, sensory change, weakness and headaches.  Endo/Heme/Allergies:  Negative for polydipsia.  Psychiatric/Behavioral: Negative.  Negative for depression, memory loss, substance abuse and suicidal ideas. The patient is not nervous/anxious and does not have insomnia.   All other systems reviewed and are negative.     Objective:   Today's Vitals   07/16/22 0852  BP: 130/70  Pulse: (!) 55  Temp: 97.8 F (36.6 C)  SpO2: 99%  Weight: 184 lb 12.8 oz (83.8 kg)  Height: 5\' 7"  (1.702 m)   Body mass index is 28.94 kg/m.  General Appearance: Well nourished, in no apparent distress. Eyes: PERRLA, EOMs, conjunctiva no swelling or erythema Sinuses: No Frontal/maxillary tenderness ENT/Mouth: Ext aud canals clear, TMs without erythema, bulging. No erythema, swelling, or exudate on post pharynx.  Tonsils not swollen or erythematous. Hearing normal.  Neck: Supple, thyroid normal.  Respiratory: Respiratory  effort normal, BS equal bilaterally without rales, rhonchi, wheezing or stridor.  Cardio: Irregularly irregular, 3/6 early systolic blowing murmur. Brisk LE pulses, without edema. Has AV fistula to left forearm with thrill. Sensation intact, cap refill brisk Abdomen: Soft, + BS.  Non tender, no guarding, rebound, hernias, masses. Lymphatics: Non tender without lymphadenopathy.  Musculoskeletal: Full ROM, 5/5 strength, normal gait. Excepting right wrist in splint; distal sensation intact.  Skin: Warm, dry without rashes; small ecchymoses to bilateral upper extremities; Robert Arnold has scaly areas with erythematous base to forehead, left cheek x 2.  Neuro: Cranial nerves intact. Normal muscle tone, no cerebellar symptoms.  Psych: Awake and oriented X 3, normal affect, Insight and Judgment appropriate.  GU: doing regular self exams, denies concerns,  declines  EKG:  Recent with Dr. Melburn Popper 07/08/22 First-degree AV block:  with RBBB and LAHB   Nalaysia Manganiello, NP   07/16/2022

## 2022-07-16 NOTE — Patient Instructions (Signed)

## 2022-07-17 LAB — COMPLETE METABOLIC PANEL WITH GFR
AG Ratio: 1.7 (calc) (ref 1.0–2.5)
ALT: 11 U/L (ref 9–46)
AST: 17 U/L (ref 10–35)
Albumin: 4 g/dL (ref 3.6–5.1)
Alkaline phosphatase (APISO): 89 U/L (ref 35–144)
BUN/Creatinine Ratio: 28 (calc) — ABNORMAL HIGH (ref 6–22)
BUN: 36 mg/dL — ABNORMAL HIGH (ref 7–25)
CO2: 26 mmol/L (ref 20–32)
Calcium: 9.8 mg/dL (ref 8.6–10.3)
Chloride: 105 mmol/L (ref 98–110)
Creat: 1.3 mg/dL — ABNORMAL HIGH (ref 0.70–1.28)
Globulin: 2.4 g/dL (calc) (ref 1.9–3.7)
Glucose, Bld: 97 mg/dL (ref 65–99)
Potassium: 5 mmol/L (ref 3.5–5.3)
Sodium: 139 mmol/L (ref 135–146)
Total Bilirubin: 1 mg/dL (ref 0.2–1.2)
Total Protein: 6.4 g/dL (ref 6.1–8.1)
eGFR: 58 mL/min/{1.73_m2} — ABNORMAL LOW (ref >=60)

## 2022-07-17 LAB — URIC ACID: Uric Acid, Serum: 3.1 mg/dL — ABNORMAL LOW (ref 4.0–8.0)

## 2022-07-17 LAB — CBC WITH DIFFERENTIAL/PLATELET
Absolute Monocytes: 433 cells/uL (ref 200–950)
Basophils Absolute: 40 cells/uL (ref 0–200)
Basophils Relative: 0.7 %
Eosinophils Absolute: 80 cells/uL (ref 15–500)
Eosinophils Relative: 1.4 %
HCT: 37.7 % — ABNORMAL LOW (ref 38.5–50.0)
Hemoglobin: 12.4 g/dL — ABNORMAL LOW (ref 13.2–17.1)
Lymphs Abs: 895 cells/uL (ref 850–3900)
MCH: 31.6 pg (ref 27.0–33.0)
MCHC: 32.9 g/dL (ref 32.0–36.0)
MCV: 95.9 fL (ref 80.0–100.0)
MPV: 9.8 fL (ref 7.5–12.5)
Monocytes Relative: 7.6 %
Neutro Abs: 4252 cells/uL (ref 1500–7800)
Neutrophils Relative %: 74.6 %
Platelets: 135 10*3/uL — ABNORMAL LOW (ref 140–400)
RBC: 3.93 10*6/uL — ABNORMAL LOW (ref 4.20–5.80)
RDW: 12.6 % (ref 11.0–15.0)
Total Lymphocyte: 15.7 %
WBC: 5.7 10*3/uL (ref 3.8–10.8)

## 2022-07-17 LAB — LIPID PANEL
Cholesterol: 119 mg/dL (ref <200)
HDL: 61 mg/dL (ref >=40)
LDL Cholesterol (Calc): 45 mg/dL (calc)
Non-HDL Cholesterol (Calc): 58 mg/dL (calc) (ref <130)
Total CHOL/HDL Ratio: 2 (calc) (ref <5.0)
Triglycerides: 46 mg/dL (ref <150)

## 2022-07-17 LAB — MAGNESIUM: Magnesium: 2.3 mg/dL (ref 1.5–2.5)

## 2022-07-17 LAB — TSH: TSH: 2.41 mIU/L (ref 0.40–4.50)

## 2022-07-17 LAB — VITAMIN D 25 HYDROXY (VIT D DEFICIENCY, FRACTURES): Vit D, 25-Hydroxy: 83 ng/mL (ref 30–100)

## 2022-07-17 LAB — HEMOGLOBIN A1C
Hgb A1c MFr Bld: 5.8 % of total Hgb — ABNORMAL HIGH (ref <5.7)
Mean Plasma Glucose: 120 mg/dL
eAG (mmol/L): 6.6 mmol/L

## 2022-07-17 LAB — PROTIME-INR
INR: 2.3 — ABNORMAL HIGH
Prothrombin Time: 23 s — ABNORMAL HIGH (ref 9.0–11.5)

## 2022-07-17 LAB — PSA: PSA: <0.04 ng/mL (ref <=4.00)

## 2022-07-17 LAB — VITAMIN B12: Vitamin B-12: 830 pg/mL (ref 200–1100)

## 2022-07-18 ENCOUNTER — Other Ambulatory Visit: Payer: Self-pay

## 2022-07-18 MED ORDER — TRAZODONE HCL 50 MG PO TABS: ORAL_TABLET | ORAL | 3 refills | Status: DC

## 2022-07-22 ENCOUNTER — Telehealth: Payer: Self-pay | Admitting: Cardiovascular Disease

## 2022-07-22 NOTE — Telephone Encounter (Signed)
Patient called to cancel CAROTID test as his wife was admitted to hospital.  Patient stated he will call back to reschedule.

## 2022-07-23 ENCOUNTER — Ambulatory Visit (HOSPITAL_COMMUNITY): Admission: RE | Admit: 2022-07-23 | Payer: Medicare Other | Source: Ambulatory Visit

## 2022-07-23 ENCOUNTER — Encounter (HOSPITAL_COMMUNITY): Payer: Self-pay

## 2022-08-06 ENCOUNTER — Ambulatory Visit: Payer: Medicare Other | Admitting: Internal Medicine

## 2022-08-09 ENCOUNTER — Ambulatory Visit (HOSPITAL_COMMUNITY)
Admission: RE | Admit: 2022-08-09 | Discharge: 2022-08-09 | Disposition: A | Discharge status: Home / Self Care | Payer: Medicare Other | Source: Ambulatory Visit | Attending: Cardiovascular Disease | Admitting: Cardiovascular Disease

## 2022-08-09 DIAGNOSIS — I5042 Chronic combined systolic (congestive) and diastolic (congestive) heart failure: Secondary | ICD-10-CM

## 2022-08-09 DIAGNOSIS — R0989 Other specified symptoms and signs involving the circulatory and respiratory systems: Secondary | ICD-10-CM | POA: Diagnosis not present

## 2022-08-12 ENCOUNTER — Telehealth: Payer: Self-pay | Admitting: Cardiovascular Disease

## 2022-08-12 DIAGNOSIS — I25708 Atherosclerosis of coronary artery bypass graft(s), unspecified, with other forms of angina pectoris: Secondary | ICD-10-CM

## 2022-08-12 DIAGNOSIS — I779 Disorder of arteries and arterioles, unspecified: Secondary | ICD-10-CM

## 2022-08-12 NOTE — Telephone Encounter (Signed)
Repeat order placed for carotid US to be done in 1 year.

## 2022-08-12 NOTE — Telephone Encounter (Signed)
-----   Message from Vesta Mixer, MD sent at 08/10/2022  4:27 PM EDT ----- Mild bilateral internal carotid disease  Repeat study in 1 year

## 2022-08-14 ENCOUNTER — Encounter: Payer: Self-pay | Admitting: Internal Medicine

## 2022-08-14 NOTE — Patient Instructions (Signed)
Warfarin Coagulopathy Warfarin coagulopathy refers to bleeding that may occur as a complication of the medicine warfarin. This can be life-threatening. Warfarin is a blood thinner (anticoagulant). Anticoagulants prevent dangerous blood clots. Bleeding is the most common and most serious complication of warfarin. While taking warfarin, you will need to have blood tests (prothrombin tests, or PT tests) regularly to measure your blood clotting time. The PT test results will be reported as the international normalized ratio (INR). The INR tells your health care provider whether your dosage of warfarin needs to be changed. The longer it takes your blood to clot, the higher the INR. Your risk of warfarin coagulopathy increases as your INR increases. What are the causes? This condition may be caused by: Taking too much warfarin (overdose). Underlying medical conditions. Dietary changes. Interactions with medicines, supplements, or alcohol. What are the signs or symptoms? Warfarin coagulopathy may cause bleeding from anywhere. Symptoms may include: Bleeding from the gums, or a nosebleed that is not easily stopped. Blood in your stool or urine. This may look like bright red, dark, or black, tarry stool, or pink, red, or brown urine. Unusual bruising or bruising easily, or a cut that does not stop bleeding within 10 minutes. Coughing up or vomiting blood. Feeling nauseous for longer than 1 day. Broken blood vessels in the eye. Abdominal or back pain with or without bruising. Unusual vaginal bleeding. Swelling or pain at an injection site. Skin scarring due to tissue death of fatty tissue. This may cause pain in the waist, thighs, or buttocks. This is more common among women. Symptoms may also include stroke signs, such as: Sudden, severe headache. Sudden weakness or numbness of the face, arm, or leg, especially on one side of the body. Sudden confusion. Difficulty speaking or understanding  speech. Sudden trouble seeing out of one or both eyes. Unexpected difficulty walking. Dizziness. Loss of balance or coordination. How is this diagnosed? This condition is diagnosed after your health care provider places you on warfarin and then tests your blood's ability to clot. Prothrombin time (PT) clotting tests are used to monitor your clotting ability. How is this treated? If you have bleeding, you may be treated with vitamin K. Vitamin K helps the blood to clot. You may also receive donated plasma. Plasma is the liquid part of blood. It contains substances that help the blood clot. If you have life-threatening bleeding, you may be given other medicines through an IV. Follow these instructions at home: Medicines Take warfarin exactly as told by your health care provider, at the same time every day. Doing this helps you avoid bleeding or blood clots that could result in serious injury, pain, or disability. Contact your health care provider if a dose is forgotten or missed. Do not change or take additional doses to make up for missed or accidental extra doses. Many prescription, over-the-counter, and pain medicines, as well as vitamins, herbs, and supplements can interfere with warfarin. Talk with your health care provider or pharmacist before starting or stopping any new medicines. Your warfarin dosage may need to be adjusted. Eating and drinking It is important to maintain a normal, balanced diet while taking warfarin. Avoid major changes in your diet. If you are planning to change your diet, talk with your health care provider before making changes. Your health care provider may recommend that you work with a dietitian. Vitamin K makes warfarin less effective. It is found in many foods. Eat a consistent amount of foods that contain vitamin K. For example,   you may decide to eat 2 vitamin K-containing foods each day. Eating the same amount each day enables your health care provider to set the  correct dose of warfarin. Tests  Make sure to have PT tests at least once every 4-6 weeks while you are taking warfarin. Ask your health care provider what your target INR range is. Make sure you always know your target range. If your INR is not in your target range, your health care provider may adjust your dosage. Preventing bleeding and injury Some common over-the-counter medicines and supplements may increase the risk of bleeding while taking warfarin, including: Aspirin. NSAIDs, such as ibuprofen or naproxen. Vitamin E. Fish oils. Avoid situations that cause bleeding by: Using a softer toothbrush. Flossing with waxed floss, not unwaxed floss. Shaving with an electric razor, not with a blade. Limiting your use of sharp objects. Avoiding potentially harmful activities, such as contact sports. General instructions Wear a medical alert bracelet or carry a card that lists what medicines you take. Make sure that all health care providers, including your dentist, know that you are taking warfarin. If you plan to breastfeed or become pregnant while taking warfarin, talk with your health care provider. Avoid alcohol, drugs, and products that contain nicotine and tobacco. If you change the amount of nicotine, tobacco, or alcohol you use, tell your health care provider. Keep all follow-up visits, including visits for lab tests. This is important. Contact a health care provider if: You miss a dose. You take an extra dose. You plan to have any kind of surgery or procedure. You may have to stop taking warfarin before your surgery. You are unable to take your medicine due to nausea, vomiting, or diarrhea. You have any major changes in your diet, or you plan to make major changes in your diet. You start or stop any over-the-counter medicine, prescription medicine, or dietary supplement. You become pregnant, plan to become pregnant, or think you may be pregnant. You have menstrual periods that  are heavier than usual, or unusual vaginal bleeding. You have unusual bruising. You lose your appetite. You have a fever. You have diarrhea that lasts for more than 24 hours. Get help right away if: You develop symptoms of an allergic reaction, such as: Swelling of the lips, face, tongue, mouth, or throat. Rash. Itching. Itchy, red, swollen areas of skin (hives). Trouble breathing. Chest tightness. You have any symptoms of a stroke. "BE FAST" is an easy way to remember the main warning signs of a stroke: B - Balance. Signs are dizziness, sudden trouble walking, or loss of balance. E - Eyes. Signs are trouble seeing or a sudden change in vision. F - Face. Signs are sudden weakness or numbness of the face, or the face or eyelid drooping on one side. A - Arms. Signs are weakness or numbness in an arm. This happens suddenly and usually on one side of the body. S - Speech. Signs are sudden trouble speaking, slurred speech, or trouble understanding what people say. T - Time. Time to call emergency services. Write down what time symptoms started. You have other signs of stroke, such as: A sudden, severe headache with no known cause. Nausea or vomiting. Seizure. You have signs or symptoms of a blood clot, such as: Pain or swelling in your leg or arm. Skin that is red or warm to the touch on your arm or leg. Shortness of breath or difficulty breathing. Chest pain. Unexplained fever. You have: A fall or an accident, especially   if you hit your head. Blood in your urine. Your urine may look reddish, pinkish, or tea-colored. Blood in your stool. Your stool may be black or bright red. Bleeding that does not stop after applying pressure to the area for 30 minutes. Severe pain in your joints or back. Purple or blue toes. Skin ulcers that do not go away. You vomit blood or cough up blood. The blood may be bright red, or it may look like coffee grounds. These symptoms may represent a serious  problem that is an emergency. Do not wait to see if the symptoms will go away. Get medical help right away. Call your local emergency services (911 in the [place] Do not drive yourself to the hospital. Summary Warfarin needs to be closely monitored with blood tests. It is very important to keep all lab visits and follow-up visits with your health care provider. Make sure you know your target INR range and your warfarin dosage. Monitor how much vitamin K you eat every day. Try to eat the same amount every day. Wear or carry identification that says you are taking warfarin. Take warfarin at the same time every day. Call your health care provider if you miss a dose or if you take an extra dose. Do not change the dosage of warfarin on your own. Know the signs and symptoms of blood clots, bleeding, and stroke. Know when to get emergency medical help. This information is not intended to replace advice given to you by your health care provider. Make sure you discuss any questions you have with your health care provider. Document Revised: 06/14/2020 Document Reviewed: 06/14/2020 [employer] Patient Education  2023 [employer]  

## 2022-08-14 NOTE — Progress Notes (Signed)
Future Appointments  Date Time Provider Department  08/15/2022  9:30 AM [name], MD [facility]  09/24/2022 10:30 AM Adela Glimpse, NP [facility]  10/29/2022  9:30 AM Adela Glimpse, NP [facility]  07/16/2023                       cpe  9:00 AM Adela Glimpse, NP [facility]    History of Present Illness:         The patient very nice nice 77 y.o. MWM  for Coag  follow up with hx/o HTN, remote ASHD - MI(2013) -->>CABG  and po pAfib,  and also hx/o HLD and Vitamin D Deficiency.    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 He was switched to Insulin in 2013 post transplant.  He presents now for Coumadin f/u. Currently takes Coumadin 5 mg x 6 da /week & 2.5 mg on Wednesdays.  Last Protime  INR = 2.3 x 1 month ago. No reported problems with bleeding or bruising.    Current Outpatient Medications on File Prior to Visit  Medication Sig   allopurinol (ZYLOPRIM) 300 MG tablet Take     1 tablet     Daily       to Prevent Gout   Ascorbic Acid (VITAMIN C) 1000 MG tablet Take  daily.   aspirin EC 81 MG tablet Take  daily.   atorvastatin (LIPITOR) 40 MG tablet Take 1 tab daily for cholesterol.   brimonidine (ALPHAGAN) 0.2 % ophthalmic solution SMARTSIG:In Eye(s)   buPROPion  XL 300 MG  Take  1 tablet  Daily    FARXIGA 10 MG TABS  Take 1 tablet daily before breakfast.   finasteride (5 MG tablet Take  1 tablet  Daily  for Prostate   gabapentin 300 MG capsule Take 300 mg by mouth at bedtime.   NOVOLIN 70/30 Inject 12-22 Units into the skin in the morning and at bedtime. 22 units in the AM, 12-15 units in the PM.   XALATAN) 0.005 % ophth soln Place 1 drop into both eyes at bedtime.    Magnesium 500 MG TABS Take 1 tablet (500 mg total) by mouth daily.   mycophenolate (MYFORTIC) 360 MG  TAKE 1 TABLET BY MOUTH TWICE DAILY   spironolactone 25 MG tablet Take 0.5 tablets (12.5 mg total) by mouth daily.    tacrolimus (PROGRAF) 0.5 MG capsule Take 0.5 mg  2 (two) times daily. With 1mg  capsule for total of 1.5mg  twice daily   tacrolimus (PROGRAF) 1 MG capsule Take 1 mg  2 (two) times daily. With  0.5g capsule for total of 1.5mg  twice daily   tamsulosin  0.4 MG CAPS capsule Take 1 cap daily at night for slow urine stream/prostate.   traZODone 50 MG tablet TAKE ONE TABLET DAILY  BEFORE BEDTIME    triamcinolone cream 0.1 % Apply 2  times daily   valsartan 80 MG tablet Take 1 tablet (80 mg total) by mouth daily.   warfarin (COUMADIN) 5 MG tablet Take  1 to 2  tablets  Daily   to prevent blood clots       Zinc 50 MG TABS Take 1 tablet Daily     Allergies  Allergen Reactions   Losartan Anaphylaxis   Crestor [Rosuvastatin] Other (See Comments)    Elevated LFT's   Lorazepam     Pt is  unsure of reaction    Morphine And Related Other (See Comments)    Has no effect on pt      Problem list He has Chronic combined systolic and diastolic CHF (congestive heart failure) (HCC); CKD stage 2 due to type 2 diabetes mellitus (HCC); Hyperlipidemia associated with type 2 diabetes mellitus (HCC); BPH with obstruction/lower urinary tract symptoms; Gout; A-fib (HCC); Essential hypertension; Vitamin D deficiency; Anticoagulant long-term use; Renal Transplant, s/p 09/2011; Depression, major, in remission (HCC); Thrombocytopenia (HCC); CAD (coronary artery disease) of artery bypass graft; IgA nephropathy; Essential tremor; Overweight (BMI 25.0-29.9); Senile purpura (HCC); Diabetes mellitus type 2, controlled (HCC); Former smoker (quit 1992); HNP (herniated nucleus pulposus), cervical; S/P cervical spinal fusion; Chronic hip pain, bilateral; Hypercoagulopathy (HCC); Mobitz I; A-V fistula (HCC); First degree AV block; History of skin cancer; B12 deficiency; and Acquired dilation of ascending aorta and aortic root (HCC) on their problem list.   Observations/Objective:  There were no vitals taken for this visit.  HEENT -  WNL. Neck - supple.  Chest - Clear equal BS. Cor - Nl HS. RRR w/o sig MGR. PP 1(+). No edema. MS- FROM w/o deformities.  Gait Nl. Neuro -  Nl w/o focal abnormalities.   Assessment and Plan:   1. Essential hypertension  - CBC with Differential/Platelet  2. Type 2 diabetes mellitus with stage 2 chronic kidney                                  disease, with long-term current use of insulin (HCC)   3. Renal Transplant, s/p 09/2011   4. Chronic atrial fibrillation (HCC)  - Protime-INR  5. Anticoagulant long-term use  - Protime-INR  6. Medication management  - CBC with Differential/Platelet - Protime-INR   Follow Up Instructions:        I discussed the assessment and treatment plan with the patient. The patient was provided an opportunity to ask questions and all were answered. The patient agreed with the plan and demonstrated an understanding of the instructions.       The patient was advised to call back or seek an in-person evaluation if the symptoms worsen or if the condition fails to improve as anticipated.    Marinus Maw, MD

## 2022-08-15 ENCOUNTER — Ambulatory Visit (INDEPENDENT_AMBULATORY_CARE_PROVIDER_SITE_OTHER): Payer: Medicare Other | Admitting: Internal Medicine

## 2022-08-15 ENCOUNTER — Encounter: Payer: Self-pay | Admitting: Internal Medicine

## 2022-08-15 VITALS — BP 134/60 | HR 60 | Temp 97.9°F | Resp 16 | Ht 67.0 in | Wt 180.0 lb

## 2022-08-15 DIAGNOSIS — N182 Chronic kidney disease, stage 2 (mild): Secondary | ICD-10-CM

## 2022-08-15 DIAGNOSIS — Z79899 Other long term (current) drug therapy: Secondary | ICD-10-CM

## 2022-08-15 DIAGNOSIS — E1122 Type 2 diabetes mellitus with diabetic chronic kidney disease: Secondary | ICD-10-CM

## 2022-08-15 DIAGNOSIS — I10 Essential (primary) hypertension: Secondary | ICD-10-CM

## 2022-08-15 DIAGNOSIS — Z940 Kidney transplant status: Secondary | ICD-10-CM | POA: Diagnosis not present

## 2022-08-15 DIAGNOSIS — Z7901 Long term (current) use of anticoagulants: Secondary | ICD-10-CM

## 2022-08-15 DIAGNOSIS — Z794 Long term (current) use of insulin: Secondary | ICD-10-CM

## 2022-08-15 DIAGNOSIS — I482 Chronic atrial fibrillation, unspecified: Secondary | ICD-10-CM

## 2022-08-16 LAB — CBC WITH DIFFERENTIAL/PLATELET
Absolute Monocytes: 400 cells/uL (ref 200–950)
Basophils Absolute: 28 cells/uL (ref 0–200)
Basophils Relative: 0.6 %
Eosinophils Absolute: 71 cells/uL (ref 15–500)
Eosinophils Relative: 1.5 %
HCT: 36.4 % — ABNORMAL LOW (ref 38.5–50.0)
Hemoglobin: 12 g/dL — ABNORMAL LOW (ref 13.2–17.1)
Lymphs Abs: 893 cells/uL (ref 850–3900)
MCH: 31.5 pg (ref 27.0–33.0)
MCHC: 33 g/dL (ref 32.0–36.0)
MCV: 95.5 fL (ref 80.0–100.0)
MPV: 10.2 fL (ref 7.5–12.5)
Monocytes Relative: 8.5 %
Neutro Abs: 3309 cells/uL (ref 1500–7800)
Neutrophils Relative %: 70.4 %
Platelets: 108 10*3/uL — ABNORMAL LOW (ref 140–400)
RBC: 3.81 10*6/uL — ABNORMAL LOW (ref 4.20–5.80)
RDW: 12.6 % (ref 11.0–15.0)
Total Lymphocyte: 19 %
WBC: 4.7 10*3/uL (ref 3.8–10.8)

## 2022-08-16 LAB — PROTIME-INR
INR: 2.6 — ABNORMAL HIGH
Prothrombin Time: 25.6 s — ABNORMAL HIGH (ref 9.0–11.5)

## 2022-08-16 NOTE — Progress Notes (Signed)
^<^<^<^<^<^<^<^<^<^<^<^<^<^<^<^<^<^<^<^<^<^<^<^<^<^<^<^<^<^<^<^<^<^<^<^<^ ^>^>^>^>^>^>^>^>^>^>^>>^>^>^>^>^>^>^>^>^>^>^>^>^>^>^>^>^>^>^>^>^>^>^>^>^>  -  Test results slightly outside the reference range are not unusual. If there is anything important, I will review this with you,  otherwise it is considered normal test values.  If you have further questions,  please do not hesitate to contact me at the office or via My Chart.   ^<^<^<^<^<^<^<^<^<^<^<^<^<^<^<^<^<^<^<^<^<^<^<^<^<^<^<^<^<^<^<^<^<^<^<^<^ ^>^>^>^>^>^>^>^>^>^>^>^>^>^>^>^>^>^>^>^>^>^>^>^>^>^>^>^>^>^>^>^>^>^>^>^>^  - ProTime INR = 2.6 x   is Great - please keep Coumadin same  ^>^>^>^>^>^>^>^>^>^>^>^>^>^>^>^>^>^>^>^>^>^>^>^>^>^>^>^>^>^>^>^>^>^>^>^>^  -   CBC  - OK  ^>^>^>^>^>^>^>^>^>^>^>^>^>^>^>^>^>^>^>^>^>^>^>^>^>^>^>^>^>^>^>^>^>^>^>^>^ ^>^>^>^>^>^>^>^>^>^>^>^>^>^>^>^>^>^>^>^>^>^>^>^>^>^>^>^>^>^>^>^>^>^>^>^>^

## 2022-08-18 ENCOUNTER — Encounter: Payer: Self-pay | Admitting: Internal Medicine

## 2022-08-21 ENCOUNTER — Ambulatory Visit: Payer: Medicare Other | Admitting: Internal Medicine

## 2022-08-26 ENCOUNTER — Ambulatory Visit: Payer: Medicare Other | Admitting: Podiatry

## 2022-08-27 ENCOUNTER — Encounter: Payer: Self-pay | Admitting: Internal Medicine

## 2022-09-09 ENCOUNTER — Other Ambulatory Visit: Payer: Self-pay | Admitting: Nurse Practitioner

## 2022-09-13 ENCOUNTER — Ambulatory Visit: Payer: Medicare Other | Admitting: Nurse Practitioner

## 2022-09-14 ENCOUNTER — Other Ambulatory Visit: Payer: Self-pay | Admitting: Nurse Practitioner

## 2022-09-16 ENCOUNTER — Encounter: Payer: Self-pay | Admitting: Internal Medicine

## 2022-09-24 ENCOUNTER — Ambulatory Visit (INDEPENDENT_AMBULATORY_CARE_PROVIDER_SITE_OTHER): Payer: Medicare Other | Admitting: Nurse Practitioner

## 2022-09-24 ENCOUNTER — Encounter: Payer: Self-pay | Admitting: Nurse Practitioner

## 2022-09-24 VITALS — BP 120/64 | HR 52 | Temp 97.5°F | Ht 67.0 in | Wt 179.0 lb

## 2022-09-24 DIAGNOSIS — I5042 Chronic combined systolic (congestive) and diastolic (congestive) heart failure: Secondary | ICD-10-CM

## 2022-09-24 DIAGNOSIS — I480 Paroxysmal atrial fibrillation: Secondary | ICD-10-CM | POA: Diagnosis not present

## 2022-09-24 DIAGNOSIS — N138 Other obstructive and reflux uropathy: Secondary | ICD-10-CM

## 2022-09-24 DIAGNOSIS — D692 Other nonthrombocytopenic purpura: Secondary | ICD-10-CM

## 2022-09-24 DIAGNOSIS — M1000 Idiopathic gout, unspecified site: Secondary | ICD-10-CM

## 2022-09-24 DIAGNOSIS — N401 Enlarged prostate with lower urinary tract symptoms: Secondary | ICD-10-CM

## 2022-09-24 DIAGNOSIS — I10 Essential (primary) hypertension: Secondary | ICD-10-CM | POA: Diagnosis not present

## 2022-09-24 DIAGNOSIS — E1122 Type 2 diabetes mellitus with diabetic chronic kidney disease: Secondary | ICD-10-CM

## 2022-09-24 DIAGNOSIS — Z940 Kidney transplant status: Secondary | ICD-10-CM

## 2022-09-24 DIAGNOSIS — D696 Thrombocytopenia, unspecified: Secondary | ICD-10-CM | POA: Diagnosis not present

## 2022-09-24 DIAGNOSIS — Z794 Long term (current) use of insulin: Secondary | ICD-10-CM

## 2022-09-24 DIAGNOSIS — N182 Chronic kidney disease, stage 2 (mild): Secondary | ICD-10-CM

## 2022-09-24 DIAGNOSIS — F325 Major depressive disorder, single episode, in full remission: Secondary | ICD-10-CM

## 2022-09-24 DIAGNOSIS — E663 Overweight: Secondary | ICD-10-CM

## 2022-09-24 DIAGNOSIS — E1169 Type 2 diabetes mellitus with other specified complication: Secondary | ICD-10-CM

## 2022-09-24 DIAGNOSIS — Z79899 Other long term (current) drug therapy: Secondary | ICD-10-CM

## 2022-09-24 DIAGNOSIS — Z7901 Long term (current) use of anticoagulants: Secondary | ICD-10-CM

## 2022-09-24 DIAGNOSIS — E785 Hyperlipidemia, unspecified: Secondary | ICD-10-CM

## 2022-09-24 DIAGNOSIS — I440 Atrioventricular block, first degree: Secondary | ICD-10-CM

## 2022-09-24 DIAGNOSIS — E559 Vitamin D deficiency, unspecified: Secondary | ICD-10-CM

## 2022-09-24 NOTE — Patient Instructions (Signed)

## 2022-09-24 NOTE — Progress Notes (Signed)
Follow Up Assessment:    Essential hypertension Discussed DASH (Dietary Approaches to Stop Hypertension) DASH diet is lower in sodium than a typical American diet. Cut back on foods that are high in saturated fat, cholesterol, and trans fats. Eat more whole-grain foods, fish, poultry, and nuts Remain active and exercise as tolerated daily.  Monitor BP at home-Call if greater than 130/80.  Check CMP/CBC   PAF (paroxysmal atrial fibrillation) (HCC) INR therapeutic. Check INR q month and will adjust medication according to labs.  Discussed if patient falls to immediately contact office or go to ER. Discussed foods that can increase or decrease Coumadin levels. Patient understands to call the office before starting a new medication. Follow up in one month.   Chronic systolic heart failure (HCC) Follows with cardiology  Control blood pressure, cholesterol, glucose, increase exercise.   Weights are down, appears euvolemic  Thrombocytopenia (HCC) Low platelets secondary to chronic anticoagulation medication Check CBC at routine visits  Gout due to renal impairment, unspecified chronicity, unspecified site No recent flare - recent level below normal. Continue medications. Recheck Uric acid as needed,  Low purine diet discussed  Vitamin D deficiency Continue supplement for goal of 60-100 Monitor Vitamin D levels  Medication management All medications discussed and reviewed in full. All questions and concerns regarding medications addressed.    Long term current use of anticoagulant therapy Monitor INR Falls risks discussed  Benign prostatic hyperplasia with lower urinary tract symptoms Continue medications Monitor BPH  Type 2 Diabetes Mellitus Continue to f/u with Marcelline Deist assistance Education: Reviewed 'ABCs' of diabetes management  Discussed goals to be met and/or maintained include A1C (<7) Blood pressure (<130/80) Cholesterol (LDL <70) Continue Eye Exam yearly   Continue Dental Exam Q6 mo Discussed dietary recommendations Discussed Physical Activity recommendations Foot exam UTD Check A1C   CKD stage 2 due to type 2 diabetes mellitus (HCC) Discussed how what you eat and drink can aide in kidney protection. Stay well hydrated. Avoid high salt foods. Avoid NSAIDS. Keep BP and BG well controlled.   Take medications as prescribed. Remain active and exercise as tolerated daily. Maintain weight.  Continue to monitor. Check CMP/GFR/Microablumin  Mild episode of recurrent major depressive disorder, in remission (HCC) Continue medications; may try reducing wellbutrin dose, sleep hygiene reviewed, keep log Lifestyle discussed: diet/exerise, sleep hygiene, stress management, hydration  Overweight  Discussed appropriate BMI Diet modification. Physical activity. Encouraged/praised to build confidence.  Hx of renal transplant; AV fistula (HCC) S/p transplant in 2013, doing well since on on myfortic and prograf (follows with [facility]), Dr. Arbie Cookey PRN for retained AV fistula   Senile purpura (HCC) R/t comadin, monitor, protect skin   First degree AV block Monitor EKG Continue to follow with Cardiology  Hyperlipidemia with DM2 Discussed lifestyle modifications. Recommended diet heavy in fruits and veggies, omega 3's. Decrease consumption of animal meats, cheeses, and dairy products. Remain active and exercise as tolerated. Continue to monitor. Check lipids/TSH  Orders Placed This Encounter  Procedures   CBC with Differential/Platelet   COMPLETE METABOLIC PANEL WITH GFR   Protime-INR    Notify office for further evaluation and treatment, questions or concerns if any reported s/s fail to improve.   The patient was advised to call back or seek an in-person evaluation if any symptoms worsen or if the condition fails to improve as anticipated.   Further disposition pending results of labs. Discussed med's effects and SE's.    I  discussed the assessment and treatment plan with  the patient. The patient was provided an opportunity to ask questions and all were answered. The patient agreed with the plan and demonstrated an understanding of the instructions.  Discussed med's effects and SE's. Screening labs and tests as requested with regular follow-up as recommended.  I provided 25 minutes of face-to-face time during this encounter including counseling, chart review, and critical decision making was preformed.  Today's Plan of Care is based on a patient-centered health care approach known as shared decision making - the decisions, tests and treatments allow for patient preferences and values to be balanced with clinical evidence.     Future Appointments  Date Time Provider Department Center  10/30/2022 10:30 AM [name], MD [facility] None  12/02/2022 10:30 AM Raynelle Dick, NP [facility] None  07/16/2023  9:00 AM Adela Glimpse, NP [facility] None     Plan:   During the course of the visit the patient was educated and counseled about appropriate screening and preventive services including:   Pneumococcal vaccine  Influenza vaccine Prevnar 13 Td vaccine Screening electrocardiogram Colorectal cancer screening Diabetes screening Glaucoma screening Nutrition counseling    Subjective:  Robert Arnold. is a 77 y.o. male who presents for general follow up and a 1 month OV for coumadin.    Overall he reports doing well.  He is continuing to remain active and play golf.  He has no additional concerns in clinic today.  States his one year anniversary of his kidney transplant is tomorrow.  The donor was his grandson.  They will celebrating together.   He underwent C6-7 disc decompression and fusion in 04/2018 by Dr. Bettina Gavia and feels fully resolved without any issues.He has chronic bilateral hip pain but improves with steroid injection, recently improved.   He has history of depression, well  controlled with wellbutrin 300 mg daily and trazodone 50 mg at night. He notes has had some more difficulty falling asleep, but does wake up feeling refreshed.   BMI is Body mass index is 28.04 kg/m., he has been working on diet,  Wt Readings from Last 3 Encounters:  09/24/22 179 lb (81.2 kg)  08/15/22 180 lb (81.6 kg)  07/16/22 184 lb 12.8 oz (83.8 kg)   He has p. A. Fib, on coumadin; hx of CAD s/p stenting 09/2011 and CABG 02/2012, has done well since. 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  He has hx of systolic CHF, improved/resolved on most recent ECHO 04/2018 however with diastolic dysfunction and LVEF 19-14%. He developed atrial fibrillation following his bypass grafting and continues to have paroxysmal atrial fibrillation. He was started on Coumadin. Follows with Dr. Elease Hashimoto.   He had a recent follow up with Dr. Elease Hashimoto 07/08/22 for CAD and Atrial Fibrillation.  He was asked to f/u at the request of Dr. Oneta Rack for further evaluation of variable heart block and reports of fatigue.    Of note, He has a history of coronary artery disease. The day following his kidney transplant he had a heart attack. He had a stent placed and then subsequently had coronary artery bypass grafting.   He was started on Entresto but was unable to afford and switched back to generic Valsartan 80 mg.  Considered Farxiga 10 mg.  He planned to move forward with a  Carotid US which he states was completed 07/23/22.  He has history of Afib and is on coumadin, he is on 5 mg other days, within goal range; no nose bleeds, blood stool/urine. No missed doses, no ABX.  Lab Results  Component Value Date   INR 2.6 (H) 08/15/2022   INR 2.3 (H) 07/16/2022   INR 2.0 (H) 06/06/2022   His blood pressure has been controlled at home, today their BP is BP: 120/64 He does workout, walking 4 days/week. He denies chest pain, shortness of breath, dizziness.    He is on cholesterol medication (atorvastatin 40 mg daily) and denies myalgias. His  cholesterol is at goal. The cholesterol last visit was:   Lab Results  Component Value Date   CHOL 119 07/16/2022   HDL 61 07/16/2022   LDLCALC 45 07/16/2022   TRIG 46 07/16/2022   CHOLHDL 2.0 07/16/2022    He has been working on diet and exercise for well controlled T2 diabetes on novolin 70/30 22 units AM, 12-15 units units at night, and denies increased appetite, nausea, paresthesia of the feet, polydipsia, polyuria and visual disturbances.  Reports fasting around 90-110. Does have occasional low glucose, always in the afternoon, none in several months.  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  On prograf and myfortic, follows [facility]  Now back in CKD 2 stage;  Lab Results  Component Value Date   GFRNONAA >60 04/10/2021   Patient is on Vitamin D supplement, alternates 10000 IU and 5000 IU every other day.  Lab Results  Component Value Date   VD25OH 83 07/16/2022     Patient is on allopurinol for gout and does not report a recent flare.  Lab Results  Component Value Date   LABURIC 3.1 (L) 07/16/2022   He notes stream is slow to start; denies nocturia, straining, dribbling. Declined med and monitoring for now.  Lab Results  Component Value Date   PSA <0.04 07/16/2022   PSA <0.04 07/04/2021   PSA <0.1 04/05/2019   Medication Review: Current Outpatient Medications on File Prior to Visit  Medication Sig Dispense Refill   allopurinol (ZYLOPRIM) 300 MG tablet Take     1 tablet     Daily       to Prevent Gout 90 tablet 3   Ascorbic Acid (VITAMIN C) 1000 MG tablet Take 1,000 mg by mouth daily.     aspirin EC 81 MG tablet Take 81 mg by mouth daily.     atorvastatin (LIPITOR) 40 MG tablet Take 1 tab daily for cholesterol. 90 tablet 3   brimonidine (ALPHAGAN) 0.2 % ophthalmic solution SMARTSIG:In  Eye(s)     buPROPion (WELLBUTRIN XL) 300 MG 24 hr tablet Take  1 tablet  Daily for Mood, Focus & Concentration                                         /         TAKE                      BY                      MOUTH 90 tablet 3   dapagliflozin propanediol (FARXIGA) 10 MG TABS tablet Take 1 tablet (10 mg total) by mouth daily before breakfast. 90 tablet 3   finasteride (PROSCAR) 5 MG tablet Take  1 tablet  Daily  for Prostate 90 tablet 3   gabapentin (NEURONTIN) 300 MG capsule Take 300 mg by mouth at bedtime.     glucose blood (FREESTYLE TEST STRIPS) test strip Check blood sugar 3 to 4 times daily for medication regulation. 450 each PRN   insulin NPH-regular Human (NOVOLIN 70/30) (70-30) 100 UNIT/ML injection Inject 12-22 Units into the skin in the morning and at bedtime. 22 units in the AM, 12-15 units in the PM. 10 mL 99   latanoprost (XALATAN) 0.005 % ophthalmic solution Place 1 drop into both eyes at bedtime.   4   Magnesium 500 MG TABS Take 1 tablet (500 mg total) by mouth daily. 30 tablet    mycophenolate (MYFORTIC) 360 MG TBEC EC tablet TAKE 1 TABLET BY MOUTH TWICE DAILY 180 tablet 1   spironolactone (ALDACTONE) 25 MG tablet TAKE ONE-HALF (0.5) TABLET BY MOUTH DAILY 45 tablet 3   tacrolimus (PROGRAF) 0.5 MG capsule Take 0.5 mg by mouth 2 (two) times daily. With 1mg  capsule for total of 1.5mg  twice daily     tacrolimus (PROGRAF) 1 MG capsule Take 1 mg by mouth 2 (two) times daily. With  0.5g capsule for total of 1.5mg  twice daily     tamsulosin (FLOMAX) 0.4 MG CAPS capsule Take 1 cap daily at night for slow urine stream/prostate. 90 capsule 3   traZODone (DESYREL) 50 MG tablet TAKE ONE TABLET BY MOUTH DAILY ONE HOUR BEFORE BEDTIME AS NEEDED FOR SLEEP 90 tablet 3   triamcinolone cream (KENALOG) 0.1 % Apply 1 application topically 2 (two) times daily. Apply  to rash  2 x /day 80 g 3   valsartan (DIOVAN) 80 MG tablet Take 1 tablet (80 mg total) by mouth daily. 90 tablet 3   warfarin (COUMADIN) 5  MG tablet Take  1 to 2  tablets  Daily  as directed to prevent blood clots                                                  /                                  TAKE                                       BY                                     MOUTH (Patient taking differently: Takes 2.5mg  on Wednesdays and 5mg  all other days) 180 tablet 3   Zinc 50 MG TABS Take 1 tablet Daily  0   No current facility-administered medications on file prior to visit.    Allergies: Allergies  Allergen Reactions   Losartan Anaphylaxis   Crestor [Rosuvastatin] Other (See Comments)    Elevated LFT's   Lorazepam     Pt is unsure of reaction    Morphine And Codeine Other (See Comments)    Has no effect on pt     Current Problems (verified) has Chronic combined systolic and diastolic CHF (congestive heart failure) (HCC); CKD stage 2 due  to type 2 diabetes mellitus (HCC); Hyperlipidemia associated with type 2 diabetes mellitus (HCC); BPH with obstruction/lower urinary tract symptoms; Gout; A-fib (HCC); Essential hypertension; Vitamin D deficiency; Anticoagulant long-term use; Renal Transplant, s/p 09/2011; Depression, major, in remission (HCC); Thrombocytopenia (HCC); CAD (coronary artery disease) of artery bypass graft; IgA nephropathy; Essential tremor; Overweight (BMI 25.0-29.9); Senile purpura (HCC); Diabetes mellitus type 2, controlled (HCC); Former smoker (quit 1992); HNP (herniated nucleus pulposus), cervical; S/P cervical spinal fusion; Chronic hip pain, bilateral; Hypercoagulopathy (HCC); Mobitz I; A-V fistula (HCC); First degree AV block; History of skin cancer; B12 deficiency; and Acquired dilation of ascending aorta and aortic root (HCC) on their problem list.  Screening Tests Immunization History  Administered Date(s) Administered   DT (Pediatric) 06/05/2015   Hepatitis B 04/08/2009   Influenza, High Dose Seasonal PF 12/27/2013, 01/17/2015, 01/11/2016, 01/02/2018, 12/21/2018, 01/31/2020, 01/15/2021,  01/04/2022   Influenza-Unspecified 12/25/2016   PFIZER(Purple Top)SARS-COV-2 Vaccination 07/24/2019, 08/17/2019, 03/14/2020   Pneumococcal Conjugate-13 10/25/2013   Pneumococcal Polysaccharide-23 06/05/2015, 11/30/2020   Pneumococcal-Unspecified 04/08/2008   Tdap 04/08/2008   Zoster Recombinat (Shingrix) 02/12/2018, 06/02/2018    Patient Care Team: [name], MD as PCP - General (Internal Medicine) Nahser, Deloris Ping, MD as PCP - Cardiology (Cardiology) Fredrich Birks, OD as Referring Physician (Optometry) Bufford Buttner, MD as Consulting Physician (Nephrology) Armbruster, Willaim Rayas, MD as Consulting Physician (Gastroenterology) Aris Lot, MD as Consulting Physician (Dermatology)  Surgical: He  has a past surgical history that includes Colonoscopy; Peritoneal catheter insertion (2011); AV fistula placement (2011); Kidney transplant (09/25/2011); Coronary angioplasty with stent; Coronary artery bypass graft (2013); Anterior cervical decomp/discectomy fusion (N/A, 05/04/2018); and laparoscopic appendectomy (N/A, 04/06/2021). Family His family history includes Alcohol abuse in his brother and maternal grandfather; Alcoholism in his brother; Alzheimer's disease in his brother; COPD in his brother and mother; Diabetes in his mother; Heart attack in his father; Kidney disease in his sister. Social history  He reports that he quit smoking about 31 years ago. His smoking use included cigarettes. He started smoking about 56 years ago. He has a 48.00 pack-year smoking history. He has never used smokeless tobacco. He reports current alcohol use. He reports that he does not use drugs.   Review of Systems  Constitutional:  Negative for malaise/fatigue and weight loss.  HENT:  Negative for hearing loss and tinnitus.   Eyes:  Negative for blurred vision and double vision.  Respiratory:  Negative for cough, sputum production, shortness of breath and wheezing.   Cardiovascular:  Negative for  chest pain, palpitations, orthopnea, claudication, leg swelling and PND.  Gastrointestinal:  Negative for abdominal pain, blood in stool, constipation, diarrhea, heartburn, melena, nausea and vomiting.  Genitourinary: Negative.   Musculoskeletal:  Negative for falls, joint pain and myalgias.  Skin:  Negative for rash.  Neurological:  Negative for dizziness, tingling, sensory change, weakness and headaches.  Endo/Heme/Allergies:  Negative for polydipsia.  Psychiatric/Behavioral: Negative.  Negative for depression, memory loss, substance abuse and suicidal ideas. The patient is not nervous/anxious and does not have insomnia.   All other systems reviewed and are negative.     Objective:   Today's Vitals   09/24/22 1032  BP: 120/64  Pulse: (!) 52  Temp: (!) 97.5 F (36.4 C)  SpO2: 99%  Weight: 179 lb (81.2 kg)  Height: 5\' 7"  (1.702 m)   Body mass index is 28.04 kg/m.  General Appearance: Well nourished, in no apparent distress. Eyes: PERRLA, EOMs, conjunctiva no swelling or erythema Sinuses: No Frontal/maxillary tenderness ENT/Mouth: Ext  aud canals clear, TMs without erythema, bulging. No erythema, swelling, or exudate on post pharynx.  Tonsils not swollen or erythematous. Hearing normal.  Neck: Supple, thyroid normal.  Respiratory: Respiratory effort normal, BS equal bilaterally without rales, rhonchi, wheezing or stridor.  Cardio: Irregularly irregular, 3/6 early systolic blowing murmur. Brisk LE pulses, without edema. Has AV fistula to left forearm with thrill. Sensation intact, cap refill brisk Abdomen: Soft, + BS.  Non tender, no guarding, rebound, hernias, masses. Lymphatics: Non tender without lymphadenopathy.  Musculoskeletal: Full ROM, 5/5 strength, normal gait. Excepting right wrist in splint; distal sensation intact.  Skin: Warm, dry without rashes; small ecchymoses to bilateral upper extremities; he has scaly areas with erythematous base to forehead, left cheek x 2.   Neuro: Cranial nerves intact. Normal muscle tone, no cerebellar symptoms.  Psych: Awake and oriented X 3, normal affect, Insight and Judgment appropriate.  GU: doing regular self exams, denies concerns, declines    Vaishali Baise, NP   09/24/2022

## 2022-09-25 LAB — CBC WITH DIFFERENTIAL/PLATELET
Absolute Monocytes: 320 cells/uL (ref 200–950)
Basophils Absolute: 41 cells/uL (ref 0–200)
Basophils Relative: 1 %
Eosinophils Absolute: 49 cells/uL (ref 15–500)
Eosinophils Relative: 1.2 %
HCT: 36.5 % — ABNORMAL LOW (ref 38.5–50.0)
Hemoglobin: 12 g/dL — ABNORMAL LOW (ref 13.2–17.1)
Lymphs Abs: 816 cells/uL — ABNORMAL LOW (ref 850–3900)
MCH: 32.1 pg (ref 27.0–33.0)
MCHC: 32.9 g/dL (ref 32.0–36.0)
MCV: 97.6 fL (ref 80.0–100.0)
MPV: 9.7 fL (ref 7.5–12.5)
Monocytes Relative: 7.8 %
Neutro Abs: 2874 cells/uL (ref 1500–7800)
Neutrophils Relative %: 70.1 %
Platelets: 118 10*3/uL — ABNORMAL LOW (ref 140–400)
RBC: 3.74 10*6/uL — ABNORMAL LOW (ref 4.20–5.80)
RDW: 12.7 % (ref 11.0–15.0)
Total Lymphocyte: 19.9 %
WBC: 4.1 10*3/uL (ref 3.8–10.8)

## 2022-09-25 LAB — COMPLETE METABOLIC PANEL WITH GFR
AG Ratio: 1.9 (calc) (ref 1.0–2.5)
ALT: 11 U/L (ref 9–46)
AST: 14 U/L (ref 10–35)
Albumin: 3.8 g/dL (ref 3.6–5.1)
Alkaline phosphatase (APISO): 80 U/L (ref 35–144)
BUN/Creatinine Ratio: 27 (calc) — ABNORMAL HIGH (ref 6–22)
BUN: 39 mg/dL — ABNORMAL HIGH (ref 7–25)
CO2: 29 mmol/L (ref 20–32)
Calcium: 9.4 mg/dL (ref 8.6–10.3)
Chloride: 105 mmol/L (ref 98–110)
Creat: 1.47 mg/dL — ABNORMAL HIGH (ref 0.70–1.28)
Globulin: 2 g/dL (calc) (ref 1.9–3.7)
Glucose, Bld: 176 mg/dL — ABNORMAL HIGH (ref 65–99)
Potassium: 5 mmol/L (ref 3.5–5.3)
Sodium: 138 mmol/L (ref 135–146)
Total Bilirubin: 1.2 mg/dL (ref 0.2–1.2)
Total Protein: 5.8 g/dL — ABNORMAL LOW (ref 6.1–8.1)
eGFR: 50 mL/min/{1.73_m2} — ABNORMAL LOW (ref >=60)

## 2022-09-25 LAB — PROTIME-INR
INR: 1.8 — ABNORMAL HIGH
Prothrombin Time: 18 s — ABNORMAL HIGH (ref 9.0–11.5)

## 2022-10-08 ENCOUNTER — Encounter: Payer: Self-pay | Admitting: Internal Medicine

## 2022-10-08 ENCOUNTER — Other Ambulatory Visit: Payer: Self-pay | Admitting: Internal Medicine

## 2022-10-08 MED ORDER — PSEUDOEPHEDRINE HCL ER 120 MG PO TB12: ORAL_TABLET | ORAL | 3 refills | Status: DC

## 2022-10-09 ENCOUNTER — Ambulatory Visit: Payer: Medicare Other | Admitting: Nurse Practitioner

## 2022-10-11 ENCOUNTER — Other Ambulatory Visit: Payer: Self-pay | Admitting: Nurse Practitioner

## 2022-10-11 ENCOUNTER — Encounter: Payer: Self-pay | Admitting: Nurse Practitioner

## 2022-10-29 ENCOUNTER — Ambulatory Visit: Payer: Medicare Other | Admitting: Nurse Practitioner

## 2022-10-30 ENCOUNTER — Ambulatory Visit: Payer: Medicare Other | Admitting: Internal Medicine

## 2022-10-31 ENCOUNTER — Encounter: Payer: Self-pay | Admitting: Internal Medicine

## 2022-11-09 ENCOUNTER — Other Ambulatory Visit: Payer: Self-pay | Admitting: Internal Medicine

## 2022-11-09 DIAGNOSIS — M1A3790 Chronic gout due to renal impairment, unspecified ankle and foot, without tophus (tophi): Secondary | ICD-10-CM

## 2022-11-12 ENCOUNTER — Other Ambulatory Visit: Payer: Self-pay | Admitting: Internal Medicine

## 2022-11-12 DIAGNOSIS — M1A3790 Chronic gout due to renal impairment, unspecified ankle and foot, without tophus (tophi): Secondary | ICD-10-CM

## 2022-11-29 NOTE — Progress Notes (Unsigned)
AWV and 1 month FOLLOW UP Assessment:   Annual Medicare Wellness Visit Due annually  Health maintenance reviewed Colonoscopy 2023 UTD  Essential hypertension - continue medications: Valsartan 80 mg every day  - Continue DASH diet, exercise and monitor at home. Call if greater than 130/80.  - Check CMP/CBC   PAF (paroxysm/al atrial fibrillation) (HCC) INR checked 09/24/22 level 1.8- Warfarin dose was increased to 5 mg QD Check INR q month and will adjust medication according to labs.  Discussed if patient falls to immediately contact office or go to ER. Discussed foods that can increase or decrease Coumadin levels. Patient understands to call the office before starting a new medication. Follow up in one month.   Chronic systolic heart failure (HCC) Improved on recent ECHO 04/2018 Continue to follow with cardiology  Control blood pressure, cholesterol, glucose, increase exercise.   Weights are down, appears euvolemic  Thrombocytopenia (HCC) Check CBC at routine visits Has been having a lot more bruising, uncontrolled bruising of arm x 2 and nosebleeds- PT/INR and will adjust dose pending results  Gout due to renal impairment, unspecified chronicity, unspecified site Continue medications. 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) Continue Novolin 70/30 22 units in am and 12-15 in the afternoon Education: Reviewed 'ABCs' of diabetes management  Discussed goals to be met and/or maintained include A1C (<7) Blood pressure (<130/80) Cholesterol (LDL <70) Continue Eye Exam yearly  Continue Dental Exam Q6 mo Discussed  dietary recommendations Discussed Physical Activity recommendations Foot exam UTD Check A1C  Hyperlipidemia with Type 2 Diabetes Mellitus(HCC) Continue Atorvastatin 40 mg every day Continue diet and exercise  CKD stage 2 due to type 2 diabetes mellitus (HCC) Discussed how what you eat and drink can aide in kidney protection. Stay well hydrated. Avoid high salt foods. Avoid NSAIDS. Keep BP and BG well controlled.   Take medications as prescribed. Remain active and exercise as tolerated daily. Maintain weight.  Continue to monitor. 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 IF THE PATIENT HAS ANY SUICIDAL OR HOMICIDAL IDEATIONS, CALL THE OFFICE, DISCUSS WITH A SUPPORT MEMBER, OR GO TO THE ER IMMEDIATELY. Patient was agreeable with this plan.  Lifestyle discussed: diet/exerise, sleep hygiene, stress management, hydration  Overweight  Discussed appropriate BMI Goal of losing 1 lb per month. Diet modification. Physical activity. Encouraged/praised to build confidence.   Chronic bilateral hip pain  Improved; monitoring  Hx of renal transplant; AV fistula (HCC) S/p transplant in 2013, doing well since on on myfortic and prograf (follows with [facility]), Dr. Arbie Cookey PRN for retained AV fistula   Senile purpura (HCC) R/t comadin, monitor, protect skin   History of skin cancer Unsure what type, non melanoma; protect skin, sunscreen encouraged; derm following        Over 30 minutes of exam, counseling, chart review, and critical decision making was performed  Future Appointments  Date Time Provider Department Center  01/17/2023  9:40 AM Nahser,  Deloris Ping, MD [facility]  07/16/2023  9:00 AM Adela Glimpse, NP [facility] None  12/02/2023 11:00 AM Raynelle Dick, NP [facility] None     Plan:   During the course of the visit the patient was educated and counseled about appropriate screening and preventive services including:   Pneumococcal vaccine  Influenza vaccine Prevnar 13 Td vaccine Screening electrocardiogram Colorectal cancer screening Diabetes screening Glaucoma screening Nutrition counseling    Subjective:  Robert Shvarts. is a 77 y.o. male who presents for AWV and 1 month OV for coumadin.    He is more depressed and stressed- his wife has left him after 48 years of marriage and said she needed some time apart from him.   He underwent C6-7 disc decompression and fusion in 04/2018 by Dr. Bettina Gavia and feels fully resolved without any issues.He has chronic bilateral hip pain but improves with steroid injection, recently improved.   He has history of depression, well controlled with wellbutrin 300 mg daily and trazodone 50 mg at night. He notes has had some more difficulty falling asleep, but does wake up feeling refreshed.   BMI is Body mass index is 27.38 kg/m., he has been working on diet,  Wt Readings from Last 3 Encounters:  12/02/22 174 lb 12.8 oz (79.3 kg)  09/24/22 179 lb (81.2 kg)  08/15/22 180 lb (81.6 kg)   He has p. A. Fib, on coumadin; hx of CAD s/p stenting 09/2011 and CABG 02/2012, has done well since. 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  He has hx of systolic CHF, improved/resolved on most recent ECHO 04/2018 however with diastolic dysfunction and LVEF 21-30%.  Follows with Dr. Elease Hashimoto.   He has history of Afib last PT/INR check for Coumadin was low and dose was increased to 5 mg every day . He has had 2 episode of skin scraping on right arm 3 days apart- could not stop bleeding after holding pressure for 2 hours and went to urgent care and had pressure dressing applied. Bruises very easily. Nosebleeds have been  occurring every 3-4 days.  No missed doses, no ABX.  Lab Results  Component Value Date   INR 1.8 (H) 09/24/2022   INR 2.6 (H) 08/15/2022   INR 2.3 (H) 07/16/2022   His blood pressure has been controlled at home, today their BP is BP: 122/64  BP Readings from Last 3 Encounters:  12/02/22 122/64  09/24/22 120/64  08/15/22 134/60  He does workout, walking 4 days/week. He denies chest pain, shortness of breath.  Has been noticing some dizziness intermittent which is occurring with walking or changing position- wobbling, not spinning.    He is on cholesterol medication (atorvastatin 40 mg daily) and denies myalgias. His cholesterol is at goal. The cholesterol last visit was:   Lab Results  Component Value Date   CHOL 119 07/16/2022   HDL 61 07/16/2022   LDLCALC 45 07/16/2022   TRIG 46 07/16/2022   CHOLHDL 2.0 07/16/2022    He has been working on diet and exercise for well controlled T2 diabetes on novolin 70/30 22 units AM, 12-15 units units at night, and denies increased appetite, nausea, paresthesia of the feet, polydipsia, polyuria and visual disturbances.  Reports fasting around 85-110.  Does have occasional low glucose, always in the afternoon, none in several months. He can feel it coming on and will drink OJ.  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  On prograf and myfortic, follows [facility]  Now back in CKD 2 stage;  Lab Results  Component Value Date   EGFR 50 (L) 09/24/2022    Patient is on Vitamin D supplement, alternates 10000 IU and 5000 IU every other day.  Lab Results  Component Value Date   VD25OH 83 07/16/2022     Patient is on allopurinol for gout and does not report a recent flare.  Lab Results  Component Value Date   LABURIC 3.1 (L) 07/16/2022   He notes stream is slow  to start; denies nocturia, straining, dribbling. Declined med and monitoring for now.  Lab Results  Component Value Date   PSA <0.04 07/16/2022   PSA <0.04 07/04/2021   PSA <0.1 04/05/2019      Medication Review: Current Outpatient Medications on File Prior to Visit  Medication Sig Dispense Refill   allopurinol (ZYLOPRIM) 300 MG tablet TAKE 1 TABLET BY MOUTH DAILY TO PREVENT GOUT 90 tablet 3   Ascorbic Acid (VITAMIN C) 1000 MG tablet Take 1,000 mg by mouth daily.     aspirin EC 81 MG tablet Take 81 mg by mouth daily.     atorvastatin (LIPITOR) 40 MG tablet Take 1 tab daily for cholesterol. 90 tablet 3   brimonidine (ALPHAGAN) 0.2 % ophthalmic solution SMARTSIG:In Eye(s)     buPROPion (WELLBUTRIN XL) 300 MG 24 hr tablet Take  1 tablet  Daily for Mood, Focus & Concentration                                         /         TAKE                      BY                      MOUTH 90 tablet 3   finasteride (PROSCAR) 5 MG tablet Take  1 tablet  Daily  for Prostate 90 tablet 3   gabapentin (NEURONTIN) 300 MG capsule Take  1 capsule  3 x /day for  Pain                                                                                              /                                                                   TAKE  BY                                                 MOUTH 270 capsule 3   glucose blood (FREESTYLE TEST STRIPS) test strip Check blood sugar 3 to 4 times daily for medication regulation. 450 each PRN   insulin NPH-regular Human (NOVOLIN 70/30) (70-30) 100 UNIT/ML injection Inject 12-22 Units into the skin in the morning and at bedtime. 22 units in the AM, 12-15 units in the PM. 10 mL 99   latanoprost (XALATAN) 0.005 % ophthalmic solution Place 1 drop into both eyes at bedtime.   4   Magnesium 500 MG TABS Take 1 tablet (500 mg total) by mouth daily. 30 tablet    mycophenolate (MYFORTIC) 360 MG TBEC EC tablet TAKE 1 TABLET BY MOUTH TWICE DAILY 180  tablet 1   pseudoephedrine (SUDAFED) 120 MG 12 hr tablet Take  1 tablet  2 x /day (every 12 hours)  for Sinus & Chest Congestion 60 tablet 3   spironolactone (ALDACTONE) 25 MG tablet TAKE ONE-HALF (0.5) TABLET BY MOUTH DAILY 45 tablet 3   tacrolimus (PROGRAF) 0.5 MG capsule Take 0.5 mg by mouth 2 (two) times daily. With 1mg  capsule for total of 1.5mg  twice daily     tacrolimus (PROGRAF) 1 MG capsule Take 1 mg by mouth 2 (two) times daily. With  0.5g capsule for total of 1.5mg  twice daily     traZODone (DESYREL) 50 MG tablet TAKE ONE TABLET BY MOUTH DAILY ONE HOUR BEFORE BEDTIME AS NEEDED FOR SLEEP 90 tablet 3   valsartan (DIOVAN) 80 MG tablet Take 1 tablet (80 mg total) by mouth daily. 90 tablet 3   warfarin (COUMADIN) 5 MG tablet Take  1 to 2  tablets  Daily  as directed to prevent blood clots                                                  /                                  TAKE                                       BY                                     MOUTH (Patient taking differently: Takes 2.5mg  on Wednesdays and 5mg  all other days) 180 tablet 3   dapagliflozin propanediol (FARXIGA) 10 MG TABS tablet Take 1 tablet (10 mg total) by mouth daily before breakfast. 90 tablet 3   tamsulosin (FLOMAX) 0.4 MG CAPS capsule Take 1 cap daily at night for slow urine stream/prostate. (Patient not taking: Reported on 12/02/2022) 90 capsule 3   triamcinolone cream (KENALOG) 0.1 % Apply 1 application topically 2 (two) times daily. Apply  to rash  2 x /day 80 g 3   No current facility-administered medications on file prior to visit.  Allergies: Allergies  Allergen Reactions   Losartan Anaphylaxis   Crestor [Rosuvastatin] Other (See Comments)    Elevated LFT's   Lorazepam     Pt is unsure of reaction    Morphine And Codeine Other (See Comments)    Has no effect on pt     Current Problems (verified) has Chronic combined systolic and diastolic CHF (congestive heart failure) (HCC); CKD stage 2 due to  type 2 diabetes mellitus (HCC); Hyperlipidemia associated with type 2 diabetes mellitus (HCC); BPH with obstruction/lower urinary tract symptoms; Gout; A-fib (HCC); Essential hypertension; Vitamin D deficiency; Anticoagulant long-term use; Renal Transplant, s/p 09/2011; Depression, major, in remission (HCC); Thrombocytopenia (HCC); CAD (coronary artery disease) of artery bypass graft; IgA nephropathy; Essential tremor; Overweight (BMI 25.0-29.9); Senile purpura (HCC); Diabetes mellitus type 2, controlled (HCC); Former smoker (quit 1992); HNP (herniated nucleus pulposus), cervical; S/P cervical spinal fusion; Chronic hip pain, bilateral; Hypercoagulopathy (HCC); Mobitz I; A-V fistula (HCC); First degree AV block; History of skin cancer; B12 deficiency; and Acquired dilation of ascending aorta and aortic root (HCC) on their problem list.  Screening Tests Immunization History  Administered Date(s) Administered   DT (Pediatric) 06/05/2015   Hepatitis B 04/08/2009   Influenza, High Dose Seasonal PF 12/27/2013, 01/17/2015, 01/11/2016, 01/02/2018, 12/21/2018, 01/31/2020, 01/15/2021, 01/04/2022   Influenza-Unspecified 12/25/2016   PFIZER(Purple Top)SARS-COV-2 Vaccination 07/24/2019, 08/17/2019, 03/14/2020   Pneumococcal Conjugate-13 10/25/2013   Pneumococcal Polysaccharide-23 06/05/2015, 11/30/2020   Pneumococcal-Unspecified 04/08/2008   Tdap 04/08/2008   Zoster Recombinant(Shingrix) 02/12/2018, 06/02/2018   Health Maintenance  Topic Date Due   Diabetic kidney evaluation - Urine ACR  07/05/2022   COVID-19 Vaccine (4 - 2023-24 season) 12/18/2022 (Originally 12/07/2021)   INFLUENZA VACCINE  02/01/2023 (Originally 11/07/2022)   HEMOGLOBIN A1C  01/15/2023   OPHTHALMOLOGY EXAM  06/19/2023   Diabetic kidney evaluation - eGFR measurement  09/24/2023   FOOT EXAM  12/02/2023   Medicare Annual Wellness (AWV)  12/02/2023   DTaP/Tdap/Td (3 - Td or Tdap) 06/04/2025   Colonoscopy  01/31/2026   Pneumonia Vaccine 78+  Years old  Completed   Hepatitis C Screening  Completed   Zoster Vaccines- Shingrix  Completed   HPV VACCINES  Aged Out     Names of Other Physician/Practitioners you currently use: 1. [facility] here for primary care 2. Dr. Lorin Picket, [facility], last visit 06/2022 3. Dr. Rocco Pauls, dentist, last visit, 07/2022 4. [facility] dermatology, last 2021, has upcoming appointment in 1 month   Patient Care Team: [name], MD as PCP - General (Internal Medicine) Nahser, Deloris Ping, MD as PCP - Cardiology (Cardiology) Fredrich Birks, OD as Referring Physician (Optometry) Bufford Buttner, MD as Consulting Physician (Nephrology) Armbruster, Willaim Rayas, MD as Consulting Physician (Gastroenterology) Aris Lot, MD as Consulting Physician (Dermatology)  Surgical: He  has a past surgical history that includes Colonoscopy; Peritoneal catheter insertion (2011); AV fistula placement (2011); Kidney transplant (09/25/2011); Coronary angioplasty with stent; Coronary artery bypass graft (2013); Anterior cervical decomp/discectomy fusion (N/A, 05/04/2018); and laparoscopic appendectomy (N/A, 04/06/2021). Family His family history includes Alcohol abuse in his brother and maternal grandfather; Alcoholism in his brother; Alzheimer's disease in his brother; COPD in his brother and mother; Diabetes in his mother; Heart attack in his father; Kidney disease in his sister. Social history  He reports that he quit smoking about 32 years ago. His smoking use included cigarettes. He started smoking about 56 years ago. He has a 49.3 pack-year smoking history. He has never used smokeless tobacco.  He reports current alcohol use. He reports that he does not use drugs.  Depression/mood screen:      12/02/2022   11:14 AM  Depression screen PHQ 2/9  Decreased Interest 2  Down, Depressed, Hopeless 2  PHQ - 2 Score 4  Altered sleeping 1  Tired, decreased energy 2  Change in  appetite 1  Feeling bad or failure about yourself  0  Trouble concentrating 0  Moving slowly or fidgety/restless 0  Suicidal thoughts 0  PHQ-9 Score 8  Difficult doing work/chores Somewhat difficult     Review of Systems  Constitutional:  Negative for malaise/fatigue and weight loss.  HENT:  Negative for hearing loss and tinnitus.   Eyes:  Negative for blurred vision and double vision.  Respiratory:  Negative for cough, sputum production, shortness of breath and wheezing.   Cardiovascular:  Negative for chest pain, palpitations, orthopnea, claudication, leg swelling and PND.  Gastrointestinal:  Negative for abdominal pain, blood in stool, constipation, diarrhea, heartburn, melena, nausea and vomiting.  Genitourinary: Negative.   Musculoskeletal:  Positive for joint pain (hips). Negative for falls and myalgias.  Skin:  Negative for rash.       Bruising, uncontrolled bleeding of skin tears  Neurological:  Negative for dizziness, tingling, sensory change, weakness and headaches.  Endo/Heme/Allergies:  Negative for polydipsia. Bruises/bleeds easily.  Psychiatric/Behavioral:  Positive for depression. Negative for memory loss, substance abuse and suicidal ideas. The patient is not nervous/anxious and does not have insomnia.   All other systems reviewed and are negative.     Objective:   Today's Vitals   12/02/22 1032  BP: 122/64  Pulse: 70  Temp: 97.7 F (36.5 C)  SpO2: 97%  Weight: 174 lb 12.8 oz (79.3 kg)  Height: 5\' 7"  (1.702 m)    Body mass index is 27.38 kg/m.  General Appearance: Well nourished, in no apparent distress. Eyes: PERRLA, EOMs, conjunctiva no swelling or erythema Sinuses: No Frontal/maxillary tenderness ENT/Mouth: Ext aud canals clear, TMs without erythema, bulging. No erythema, swelling, or exudate on post pharynx.  Tonsils not swollen or erythematous. Hearing normal.  Neck: Supple, thyroid normal.  Respiratory: Respiratory effort normal, BS equal  bilaterally without rales, rhonchi, wheezing or stridor.  Cardio: Irregularly irregular, 3/6 early systolic blowing murmur. Brisk LE pulses, without edema. Has AV fistula to left forearm with thrill. Sensation intact, cap refill brisk Abdomen: Soft, + BS.  Non tender, no guarding, rebound, hernias, masses. Lymphatics: Non tender without lymphadenopathy.  Musculoskeletal: Full ROM, 5/5 strength, normal gait.  Skin: Warm, dry . Large ecchymoses to bilateral upper extremities; Has pressure dressing over skin tear on right forearm.  Neuro: Cranial nerves intact. Normal muscle tone, no cerebellar symptoms. Sensation intact to momofilament Psych: Awake and oriented X 3, teary affect, Insight and Judgment appropriate.  GU: doing regular self exams, denies concerns, declines   Raynelle Dick, NP   12/02/2022

## 2022-12-02 ENCOUNTER — Ambulatory Visit (INDEPENDENT_AMBULATORY_CARE_PROVIDER_SITE_OTHER): Payer: Medicare Other | Admitting: Nurse Practitioner

## 2022-12-02 ENCOUNTER — Encounter: Payer: Self-pay | Admitting: Nurse Practitioner

## 2022-12-02 VITALS — BP 122/64 | HR 70 | Temp 97.7°F | Ht 67.0 in | Wt 174.8 lb

## 2022-12-02 DIAGNOSIS — N138 Other obstructive and reflux uropathy: Secondary | ICD-10-CM

## 2022-12-02 DIAGNOSIS — E663 Overweight: Secondary | ICD-10-CM

## 2022-12-02 DIAGNOSIS — Z794 Long term (current) use of insulin: Secondary | ICD-10-CM

## 2022-12-02 DIAGNOSIS — I5042 Chronic combined systolic (congestive) and diastolic (congestive) heart failure: Secondary | ICD-10-CM

## 2022-12-02 DIAGNOSIS — M25552 Pain in left hip: Secondary | ICD-10-CM

## 2022-12-02 DIAGNOSIS — Z85828 Personal history of other malignant neoplasm of skin: Secondary | ICD-10-CM

## 2022-12-02 DIAGNOSIS — D692 Other nonthrombocytopenic purpura: Secondary | ICD-10-CM

## 2022-12-02 DIAGNOSIS — Z7901 Long term (current) use of anticoagulants: Secondary | ICD-10-CM

## 2022-12-02 DIAGNOSIS — Z0001 Encounter for general adult medical examination with abnormal findings: Secondary | ICD-10-CM | POA: Diagnosis not present

## 2022-12-02 DIAGNOSIS — I10 Essential (primary) hypertension: Secondary | ICD-10-CM

## 2022-12-02 DIAGNOSIS — I770 Arteriovenous fistula, acquired: Secondary | ICD-10-CM

## 2022-12-02 DIAGNOSIS — M25551 Pain in right hip: Secondary | ICD-10-CM

## 2022-12-02 DIAGNOSIS — I482 Chronic atrial fibrillation, unspecified: Secondary | ICD-10-CM

## 2022-12-02 DIAGNOSIS — E1169 Type 2 diabetes mellitus with other specified complication: Secondary | ICD-10-CM

## 2022-12-02 DIAGNOSIS — I480 Paroxysmal atrial fibrillation: Secondary | ICD-10-CM

## 2022-12-02 DIAGNOSIS — R6889 Other general symptoms and signs: Secondary | ICD-10-CM

## 2022-12-02 DIAGNOSIS — N401 Enlarged prostate with lower urinary tract symptoms: Secondary | ICD-10-CM

## 2022-12-02 DIAGNOSIS — Z940 Kidney transplant status: Secondary | ICD-10-CM

## 2022-12-02 DIAGNOSIS — E1122 Type 2 diabetes mellitus with diabetic chronic kidney disease: Secondary | ICD-10-CM

## 2022-12-02 DIAGNOSIS — G8929 Other chronic pain: Secondary | ICD-10-CM

## 2022-12-02 DIAGNOSIS — E559 Vitamin D deficiency, unspecified: Secondary | ICD-10-CM

## 2022-12-02 DIAGNOSIS — M1000 Idiopathic gout, unspecified site: Secondary | ICD-10-CM

## 2022-12-02 DIAGNOSIS — D696 Thrombocytopenia, unspecified: Secondary | ICD-10-CM | POA: Diagnosis not present

## 2022-12-02 DIAGNOSIS — F325 Major depressive disorder, single episode, in full remission: Secondary | ICD-10-CM

## 2022-12-02 DIAGNOSIS — F321 Major depressive disorder, single episode, moderate: Secondary | ICD-10-CM

## 2022-12-02 DIAGNOSIS — N182 Chronic kidney disease, stage 2 (mild): Secondary | ICD-10-CM

## 2022-12-02 DIAGNOSIS — Z0000 Encounter for general adult medical examination without abnormal findings: Secondary | ICD-10-CM

## 2022-12-02 DIAGNOSIS — Z79899 Other long term (current) drug therapy: Secondary | ICD-10-CM

## 2022-12-02 DIAGNOSIS — E785 Hyperlipidemia, unspecified: Secondary | ICD-10-CM

## 2022-12-02 MED ORDER — WARFARIN SODIUM 5 MG PO TABS: ORAL_TABLET | ORAL | Status: DC

## 2022-12-02 NOTE — Patient Instructions (Signed)
Major Depressive Disorder, Adult Major depressive disorder (MDD) is a mental health condition. People with this disorder feel very sad, hopeless, and lose interest in things. Symptoms last most of the day, almost every day, for 2 weeks. MDD can affect: Relationships. Work and school. Things you usually like to do. What are the causes? The cause of MDD is not known. What increases the risk? Having family members with depression. Being male. Family problems. Alcohol or drug misuse. A lot of stress in your life, such as from: Living without basic needs such as food and housing. Being treated poorly because of race, sex, or religion (discrimination). Things that caused you pain as a child, especially if you lost a parent or were abused. Health and mental problems that you have had for a long time. What are the signs or symptoms? The main symptoms of this condition are: Being sad all the time. Being grouchy (irritable) all the time. Not enjoying the things you usually like. Sleeping too much or too little. Eating too much or too little. Feeling tired. Other symptoms include: Gaining or losing weight, without knowing why. Being restless and weak. Feeling hopeless, worthless, or guilty. Trouble thinking or making decisions. Thoughts of hurting yourself or others, or thoughts of ending your life. Spending a lot of time alone. Being unable to do daily tasks. If you have very bad MDD, you may: Believe things that are not true. Hear, see, taste, or feel things that are not there. Have mild depression that lasts for at least 2 years. Feel very sad and hopeless. Have trouble speaking or moving. Feel very sad during some seasons. How is this treated? Talk therapy. This teaches you about thoughts, feelings, and actions and how to change them. This can also help you to talk with others. This can be done with members of your family. Medicines. Lifestyle changes. You may need to: Limit  alcohol use. Stop using drugs, if you use them. Exercise. Get plenty of sleep. Eat healthy. Spend more time outdoors. Brain stimulation. This may be done when symptoms are very bad or have not gotten better. Follow these instructions at home: Alcohol use Do not drink alcohol if: Your health care provider tells you not to drink. You are pregnant, may be pregnant, or are planning to become pregnant. If you drink alcohol: Limit how much you use to: 0-1 drink a day for women. 0-2 drinks a day for men. Know how much alcohol is in your drink. In the [place] one drink equals one 12 oz bottle of beer (355 mL), one 5 oz glass of wine (148 mL), or one 1 oz glass of hard liquor (44 mL). Activity Exercise as told by your doctor. Spend time outdoors. Make time to do the things you enjoy. Find ways to deal with stress. Try to: Meditate. Do deep breathing. Spend time in nature. Keep a journal. Return to your normal activities when your doctor says that it is safe. General instructions  Take over-the-counter and prescription medicines only as told by your doctor. Talk to your doctor about: Alcohol use. It can affect medicines. Any drug use. Eat healthy foods. Get a lot of sleep. Think about joining a support group. Ask your doctor about that. Keep all follow-up visits. Your doctor will need to check on your mood, behavior, and medicines, and change your treatment as needed. Where to find more information: [employer] on Mental Illness: nami[employer] of Mental Health: BloggerCourse.com American Psychiatric Association: psychiatry.org Contact a doctor  if: You feel worse. You get new symptoms. Get help right away if: You hurt yourself on purpose (self-harm). You have thoughts about hurting yourself or others. You see, hear, taste, smell, or feel things that are not there. Get help right away if you feel like you may hurt yourself or others, or have thoughts about taking  your own life. Go to your nearest emergency room or: Call 911. Call the National Suicide Prevention Lifeline at (812)616-4069 or 988. This is open 24 hours a day. Text the Crisis Text Line at (919)308-8953. This information is not intended to replace advice given to you by your health care provider. Make sure you discuss any questions you have with your health care provider. Document Revised: 07/31/2021 Document Reviewed: 07/31/2021 Elsevier Patient Education  2024 [employer].

## 2022-12-03 LAB — COMPLETE METABOLIC PANEL WITH GFR
AG Ratio: 1.7 (calc) (ref 1.0–2.5)
ALT: 12 U/L (ref 9–46)
AST: 15 U/L (ref 10–35)
Albumin: 3.8 g/dL (ref 3.6–5.1)
Alkaline phosphatase (APISO): 80 U/L (ref 35–144)
BUN/Creatinine Ratio: 27 (calc) — ABNORMAL HIGH (ref 6–22)
BUN: 34 mg/dL — ABNORMAL HIGH (ref 7–25)
CO2: 29 mmol/L (ref 20–32)
Calcium: 9.5 mg/dL (ref 8.6–10.3)
Chloride: 105 mmol/L (ref 98–110)
Creat: 1.27 mg/dL (ref 0.70–1.28)
Globulin: 2.3 g/dL (ref 1.9–3.7)
Glucose, Bld: 95 mg/dL (ref 65–99)
Potassium: 4.8 mmol/L (ref 3.5–5.3)
Sodium: 139 mmol/L (ref 135–146)
Total Bilirubin: 1.6 mg/dL — ABNORMAL HIGH (ref 0.2–1.2)
Total Protein: 6.1 g/dL (ref 6.1–8.1)
eGFR: 59 mL/min/{1.73_m2} — ABNORMAL LOW (ref >=60)

## 2022-12-03 LAB — CBC WITH DIFFERENTIAL/PLATELET
Absolute Monocytes: 300 {cells}/uL (ref 200–950)
Basophils Absolute: 50 {cells}/uL (ref 0–200)
Basophils Relative: 1 %
Eosinophils Absolute: 50 {cells}/uL (ref 15–500)
Eosinophils Relative: 1 %
HCT: 36.6 % — ABNORMAL LOW (ref 38.5–50.0)
Hemoglobin: 12.1 g/dL — ABNORMAL LOW (ref 13.2–17.1)
Lymphs Abs: 670 {cells}/uL — ABNORMAL LOW (ref 850–3900)
MCH: 32.4 pg (ref 27.0–33.0)
MCHC: 33.1 g/dL (ref 32.0–36.0)
MCV: 98.1 fL (ref 80.0–100.0)
MPV: 9.9 fL (ref 7.5–12.5)
Monocytes Relative: 6 %
Neutro Abs: 3930 {cells}/uL (ref 1500–7800)
Neutrophils Relative %: 78.6 %
Platelets: 117 10*3/uL — ABNORMAL LOW (ref 140–400)
RBC: 3.73 10*6/uL — ABNORMAL LOW (ref 4.20–5.80)
RDW: 12.6 % (ref 11.0–15.0)
Total Lymphocyte: 13.4 %
WBC: 5 10*3/uL (ref 3.8–10.8)

## 2022-12-03 LAB — PROTIME-INR
INR: 3.1 — ABNORMAL HIGH
Prothrombin Time: 30.8 s — ABNORMAL HIGH (ref 9.0–11.5)

## 2022-12-03 LAB — LIPID PANEL
Cholesterol: 133 mg/dL (ref <200)
HDL: 76 mg/dL (ref >=40)
LDL Cholesterol (Calc): 47 mg/dL
Non-HDL Cholesterol (Calc): 57 mg/dL (ref <130)
Total CHOL/HDL Ratio: 1.8 (calc) (ref <5.0)
Triglycerides: 36 mg/dL (ref <150)

## 2022-12-03 LAB — MAGNESIUM: Magnesium: 2.4 mg/dL (ref 1.5–2.5)

## 2022-12-24 ENCOUNTER — Other Ambulatory Visit: Payer: Self-pay | Admitting: Internal Medicine

## 2022-12-24 MED ORDER — BUPROPION HCL ER (XL) 300 MG PO TB24: ORAL_TABLET | ORAL | 3 refills | Status: AC

## 2023-01-02 ENCOUNTER — Ambulatory Visit (INDEPENDENT_AMBULATORY_CARE_PROVIDER_SITE_OTHER): Payer: Medicare Other

## 2023-01-02 VITALS — BP 146/66 | HR 71 | Temp 97.8°F | Ht 67.0 in | Wt 176.0 lb

## 2023-01-02 DIAGNOSIS — I482 Chronic atrial fibrillation, unspecified: Secondary | ICD-10-CM | POA: Diagnosis not present

## 2023-01-02 NOTE — Progress Notes (Signed)
Patient presents to the office for a nurse visit to have labs done to recheck a PT-Inr. Currently taking 5mg , 1 tablet daily except on M,W, & F, takes a 1/2 tablet.

## 2023-01-03 LAB — PROTIME-INR
INR: 1.2 — ABNORMAL HIGH
Prothrombin Time: 12.8 s — ABNORMAL HIGH (ref 9.0–11.5)

## 2023-01-04 NOTE — Progress Notes (Signed)
<>*<>*<>*<>*<>*<>*<>*<>*<>*<>*<>*<>*<>*<>*<>*<>*<>*<>*<>*<>*<>*<>*<>*<>*<> <>*<>*<>*<>*<>*<>*<>*<>*<>*<>*<>*<>*<>*<>*<>*<>*<>*<>*<>*<>*<>*<>*<>*<>*<>  -  Test results slightly outside the reference range are not unusual. If there is anything important, I will review this with you,  otherwise it is considered normal test values.  If you have further questions,  please do not hesitate to contact me at the office or via My Chart.   <>*<>*<>*<>*<>*<>*<>*<>*<>*<>*<>*<>*<>*<>*<>*<>*<>*<>*<>*<>*<>*<>*<>*<>*<> <>*<>*<>*<>*<>*<>*<>*<>*<>*<>*<>*<>*<>*<>*<>*<>*<>*<>*<>*<>*<>*<>*<>*<>*<>  - Now PT/ INR is a little too low , So   Change dose to  -  1 whole tablet 5 days / week    &       only take 1/2 tablet 2 x / week on Sun &  Thurs      til next OV on 02/06/23   with Annabelle Harman   <>*<>*<>*<>*<>*<>*<>*<>*<>*<>*<>*<>*<>*<>*<>*<>*<>*<>*<>*<>*<>*<>*<>*<>*<> <>*<>*<>*<>*<>*<>*<>*<>*<>*<>*<>*<>*<>*<>*<>*<>*<>*<>*<>*<>*<>*<>*<>*<>*<>

## 2023-01-13 ENCOUNTER — Ambulatory Visit (INDEPENDENT_AMBULATORY_CARE_PROVIDER_SITE_OTHER): Payer: Medicare Other | Admitting: Psychology

## 2023-01-13 DIAGNOSIS — F4321 Adjustment disorder with depressed mood: Secondary | ICD-10-CM | POA: Diagnosis not present

## 2023-01-13 NOTE — Progress Notes (Signed)
[facility] Counsel[facility] Initial Adult Exam  Name: Robert Arnold. Date: 01/13/2023 MRN: [id number] DOB: 1945/04/25 PCP: Robert Cowboy, MD  Time Spent: 3:10  pm - 4:00 pm: 50 Minutes   Robert Arnold [place]. participated from home, via video, and consented to treatment. Therapist participated from home office. We met online due to COVID pandemic.   Guardian/Payee:  N/A    Paperwork requested: No   Reason for Visit /Presenting Problem: emotional adjustment associated with recent marital separation  Mental Status Exam: Appearance:   Casual     Behavior:  Appropriate  Motor:  Normal  Speech/Language:   Normal Rate  Affect:  Appropriate  Mood:  depressed  Thought process:  normal  Thought content:    WNL  Sensory/Perceptual disturbances:    WNL  Orientation:  oriented to person, place, and situation  Attention:  Good  Concentration:  Good  Memory:  WNL  Fund of knowledge:   Good  Insight:    unknown  Judgment:   unknown  Impulse Control:  Good     Reported Symptoms:  sad, depressed, anxious  Risk Assessment: Danger to Self:  No Self-injurious Behavior: No Danger to Others: No Duty to Warn:no Physical Aggression / Violence:No  Access to Firearms a concern:  unknown Gang Involvement:No  Patient / guardian was educated about steps to take if suicide or homicide risk level increases between visits: n/a While future psychiatric events cannot be accurately predicted, the patient does not currently require acute inpatient psychiatric care and does not currently meet [facility] involuntary commitment criteria.  Substance Abuse History: Current substance abuse: No     Past Psychiatric History:   No previous psychological problems have been observed Outpatient Providers:N/A History of Psych Hospitalization: No  Psychological Testing:  N/A    Abuse History:  Victim of: No.,  N/A    Report needed: No. Victim of  Neglect:No. Perpetrator of  N/A   Witness / Exposure to Domestic Violence: No   Protective Services Involvement: No  Witness to [employer] Violence:  No   Family History:  Family History  Problem Relation Age of Onset   Diabetes Mother    COPD Mother    Heart attack Father        Unsure    Kidney disease Sister    Alcoholism Brother    Alzheimer's disease Brother    Alcohol abuse Brother    COPD Brother    Alcohol abuse Maternal Grandfather    Colon cancer Neg Hx    Stomach cancer Neg Hx    Rectal cancer Neg Hx    Esophageal cancer Neg Hx    Liver cancer Neg Hx    Pancreatic cancer Neg Hx     Living situation: the patient lives with their spouse  Sexual Orientation: Straight  Relationship Status: separated  Name of spouse / other:Robert Arnold If a parent, number of children / ages:adult step-son  Support Systems: minimal support  Financial Stress:  No   Income/Employment/Disability: Neurosurgeon:  unknown  Educational History: Education:  unknown  Oncologist: unknown  Any cultural differences that may affect / interfere with treatment:  not applicable   Recreation/Hobbies: exercise  Stressors: Marital or family conflict    Strengths: unknown  Barriers:  social isolation   Legal History: Pending legal issue / charges: The patient has no significant history  of legal issues. History of legal issue / charges:  N/A  Medical History/Surgical History: not reviewed Past Medical History:  Diagnosis Date   Adenomatous colon polyp    Allergy    BPH (benign prostatic hyperplasia)    CAD (coronary artery disease)    a. NSTEMI 09/2011 s/p PCI to LAD. b. CABG 01/2012 @ [facility].   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   Essential hypertension    FHx: heart disease 02/27/2018   Glaucoma    Gout    Histoplasmosis    on  itraconazole for prophylaxis   Ischemic cardiomyopathy    Mild aortic stenosis    Mild dilation of ascending aorta (HCC)    NSTEMI (non-ST elevated myocardial infarction) (HCC) 11/26/2013   2013    OSA (obstructive sleep apnea)    PAF (paroxysmal atrial fibrillation) (HCC)    Testosterone deficiency 02/27/2018   Trifascicular block     Past Surgical History:  Procedure Laterality Date   ANTERIOR CERVICAL DECOMP/DISCECTOMY FUSION N/A 05/04/2018   Procedure: Cervical Five-Six Anterior cervical decompression/discectomy/fusion;  Surgeon: Robert Kelly, MD;  Location: [facility] OR;  Service: Neurosurgery;  Laterality: N/A;  Cervical Five-Six Anterior cervical decompression/discectomy/fusion   AV FISTULA PLACEMENT  2011   Left forearm   COLONOSCOPY     CORONARY ANGIOPLASTY WITH STENT PLACEMENT     CORONARY ARTERY BYPASS GRAFT  2013   KIDNEY TRANSPLANT  09/25/2011   LAPAROSCOPIC APPENDECTOMY N/A 04/06/2021   Procedure: APPENDECTOMY LAPAROSCOPIC;  Surgeon: Robert Loron, MD;  Location: [facility] OR;  Service: General;  Laterality: N/A;   PERITONEAL CATHETER INSERTION  2011    Medications: Current Outpatient Medications  Medication Sig Dispense Refill   allopurinol (ZYLOPRIM) 300 MG tablet TAKE 1 TABLET BY MOUTH DAILY TO PREVENT GOUT 90 tablet 3   Ascorbic Acid (VITAMIN C) 1000 MG tablet Take 1,000 mg by mouth daily.     atorvastatin (LIPITOR) 40 MG tablet Take 1 tab daily for cholesterol. 90 tablet 3   brimonidine (ALPHAGAN) 0.2 % ophthalmic solution SMARTSIG:In Eye(s)     buPROPion (WELLBUTRIN XL) 300 MG 24 hr tablet Take  1 tablet  Daily for Mood, Focus & Concentration                                         /         TAKE                      BY                      MOUTH 90 tablet 3   finasteride (PROSCAR) 5 MG tablet Take  1 tablet  Daily  for Prostate 90 tablet 3   gabapentin (NEURONTIN) 300 MG capsule Take  1 capsule  3 x /day for  Pain                                                                                               /  TAKE                                         BY                                                 MOUTH 270 capsule 3   glucose blood (FREESTYLE TEST STRIPS) test strip Check blood sugar 3 to 4 times daily for medication regulation. 450 each PRN   insulin NPH-regular Human (NOVOLIN 70/30) (70-30) 100 UNIT/ML injection Inject 12-22 Units into the skin in the morning and at bedtime. 22 units in the AM, 12-15 units in the PM. 10 mL 99   latanoprost (XALATAN) 0.005 % ophthalmic solution Place 1 drop into both eyes at bedtime.   4   Magnesium 500 MG TABS Take 1 tablet (500 mg total) by mouth daily. 30 tablet    mycophenolate (MYFORTIC) 360 MG TBEC EC tablet TAKE 1 TABLET BY MOUTH TWICE DAILY 180 tablet 1   spironolactone (ALDACTONE) 25 MG tablet TAKE ONE-HALF (0.5) TABLET BY MOUTH DAILY 45 tablet 3   tacrolimus (PROGRAF) 0.5 MG capsule Take 0.5 mg by mouth 2 (two) times daily. With 1mg  capsule for total of 1.5mg  twice daily     tacrolimus (PROGRAF) 1 MG capsule Take 1 mg by mouth 2 (two) times daily. With  0.5g capsule for total of 1.5mg  twice daily     traZODone (DESYREL) 50 MG tablet TAKE ONE TABLET BY MOUTH DAILY ONE HOUR BEFORE BEDTIME AS NEEDED FOR SLEEP 90 tablet 3   valsartan (DIOVAN) 80 MG tablet Take 1 tablet (80 mg total) by mouth daily. 90 tablet 3   warfarin (COUMADIN) 5 MG tablet Take  1 tab daily     No current facility-administered medications for this visit.    Allergies  Allergen Reactions   Losartan Anaphylaxis   Crestor [Rosuvastatin] Other (See Comments)    Elevated LFT's   Lorazepam     Pt is unsure of reaction    Morphine And Codeine Other (See Comments)    Has no effect on pt   Initial Note: Patient says his wife of 48 years, Dewayne Hatch, has always had mental health issues. In Feb., "the wheels came off" and she committed herself to psychiatric hospital in April. She was in  10-12 days and a week later she tried to commit suicide with pills. He called 911 and she was back in hospital at [facility] and then transferred to [facility]. She was in hospital in [place] because they said they did not have beds. He says he visited her every night. After several days, she told him not to come because their son was visiting. After discharge, she told him that she wasn't coming home and she went to live with their son. His (step)son did not initially give him any information. Their son is now giving him information. His sister in law will not return his calls. He says that his wife accuses him of things that have never happened. She claims that he has been mentally abusing her. Wife is seeing a psychologist in Dr. Alwyn Ren office. Son Leonette Most is now talking with him and tells him he knows that his mother is fabricating things. He says he loves hs wife and wants  her back. She tells him she wants to come back at some point and they are seeing each other regularly (3-4 days/week). He said before recent situation, marriage had "normal ups and downs". Never separated in the past. Her mental health issues were primarily depression. Wife's second marriage and his first.  He says he was "terrible" at the beginning of her decline. He has been fighting depression and has to keep busy. Often watches TV, house projects and [facility] [employer]. Is involved at the church. He has no social network and is lonely. Wife is making friends. When he is with his wife, he feels uptight out of fear of pushing her away more. Walking on eggshells because she is so sensitive to what he says.  FOO: half sister, full sister and a brother. Very close with half sister, but brother "disowned" him (as of 6-8 weeks ago) and sister won't talk with him. She disowned him for 7 years. They reconnected and then she cut him off again. Ashby Dawes of relationship problems with sibs is not clear.  Used to play golf and bowl, but he has a bad knee that  keeps him from both.  He had kidney transplant 11 years ago. Close with grandson and his 2 great grandchildren (boy and girl). No alcohol or other substances. His father, and 4 brothers were all alcoholic.  Fa. passed 30 years ago and mother 10 years ago. Never treated for any mental health issues. He is seeking to manage feelings associated with marital separation. Wife has been at [facility]' house X 3 months.   Tx. Plan/goals: Patient is seeking to reduce depressive symptoms of sadness, helplessness and despair associated the the recent unwanted marital separation. In addition, he expresses the need to better understand how to foster a better relationship with spouse to facilitate their anticipated reconciliation. Treatment will utilize systems oriented counseling and insight oriented therapy. Goal date is 3-24.         Diagnoses:  Adjustment Disorder with depressed mood  Plan of Care: outpatient psychotherapy (individual) and referral to conjoint if needed   Garrel Ridgel, PhD

## 2023-01-15 ENCOUNTER — Encounter: Payer: Self-pay | Admitting: Cardiovascular Disease

## 2023-01-15 NOTE — Progress Notes (Addendum)
Cardiology Office Note:    Date:  01/17/2023   ID:  Robert Arnold., DOB 03/07/1946, MRN [id number]  PCP:  [name], MD  Cardiologist:  Kristeen Miss, MD  Electrophysiologist:  None   Referring MD: [name], MD   Chief Complaint  Patient presents with   Coronary Artery Disease         Problem list 1.  Coronary artery disease-status post stenting ( September 26, 2011 and coronary artery bypass grafting -  ~ Oct. ,  2013  2.  Paroxysmal atrial fibrillation 3.  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 5.  Hyperlipidemia    Jan. 8 2019   Robert Arnold. is a 77 y.o. male with a hx of AV block . We are asked to see him today by Dr. Salomon Fick for further evaluation of this variable heart block.  He has a history of coronary artery disease.  The day following his kidney transplant he had a heart attack.  He had a stent placed and then subsequently had coronary artery bypass grafting.  He developed atrial fibrillation following his bypass grafting and continues to have paroxysmal atrial fibrillation.  He was started on Coumadin.  He has had his INR levels measured at Dr. Sherlon Handing office.   Hx of kidney transplant 6 years ago.  Also has a history of insulin-dependent diabetes mellitus. He typically exercises quite a bit.  He is not exercised in the past several weeks because of the recent this recent finding of variable heart block. He was seen by Dr. Salomon Fick and the EKG suggested that he might have trifascicular heart block.  Denies any episodes of syncope or presyncope.  Feb. 4, 2022: Robert Arnold is seen today for follow up of his AV block , CAD Has some left rib pain , Not exertional , not associated with eating or drinking  Usually resolves with tylenol    Feb. 20, 2023 Robert Arnold is seen today for follow up of his AV block, CAD  Was  hospitalized in Jan with chest pain    Still has this L rib pain .  Worse with palpitation  Might last a day  Not associated with exercise   Echo recently showed an EF of 45-50%.   Mild - mod AI, mild AS Mild aortic dilatation (41mm)   Feb. 19, 2024 Robert Arnold is seen today for follow up of his AV block, CAD  He was seen last year for some chest pain Myoview showed a previous MI, no reversible ischemia  No cardiac complaints .  Exercising some .     No DOE with exercise   Last echocardiogram was from January, 2023.  His LVEF is 45 to 50%.  His previous echo performed in 2020 revealed EF of 50 to 55%.  We will discontinue his Diovan.  Will start him on Entresto 24-26 twice a day.  Will consider adding Jardiance at his next office visit.  I will see him again in 4 to 6 weeks for follow-up visit.  Will check basic metabolic profile at that time.   July 08, 2022 Robert Arnold is seen for follow up of his CAD , AV block Mild combined CHF  Was started on Entresto at his last visit but he cannot afford it  Has switched back to generic Valsartan Will consider Farxiga 10 mg a day  Oct. 11, 2024 Robert Arnold is seen for follow up of his CAD, AV block, mild CHF  Has been exercising at the [facility]  Is working up to doing cardio exercise     Past Medical History:  Diagnosis Date   Adenomatous colon polyp    Allergy    BPH (benign prostatic hyperplasia)    CAD (coronary artery disease)    a. NSTEMI 09/2011 s/p PCI to LAD. b. CABG 01/2012 @ [facility].   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   Essential hypertension    FHx: heart disease 02/27/2018   Glaucoma    Gout    Histoplasmosis    on itraconazole for prophylaxis   Ischemic cardiomyopathy    Mild aortic stenosis    Mild dilation of ascending aorta (HCC)    NSTEMI (non-ST elevated myocardial infarction) (HCC) 11/26/2013   2013    OSA (obstructive sleep  apnea)    PAF (paroxysmal atrial fibrillation) (HCC)    Testosterone deficiency 02/27/2018   Trifascicular block     Past Surgical History:  Procedure Laterality Date   ANTERIOR CERVICAL DECOMP/DISCECTOMY FUSION N/A 05/04/2018   Procedure: Cervical Five-Six Anterior cervical decompression/discectomy/fusion;  Surgeon: Shirlean Kelly, MD;  Location: [facility] OR;  Service: Neurosurgery;  Laterality: N/A;  Cervical Five-Six Anterior cervical decompression/discectomy/fusion   AV FISTULA PLACEMENT  2011   Left forearm   COLONOSCOPY     CORONARY ANGIOPLASTY WITH STENT PLACEMENT     CORONARY ARTERY BYPASS GRAFT  2013   KIDNEY TRANSPLANT  09/25/2011   LAPAROSCOPIC APPENDECTOMY N/A 04/06/2021   Procedure: APPENDECTOMY LAPAROSCOPIC;  Surgeon: Emelia Loron, MD;  Location: [facility] OR;  Service: General;  Laterality: N/A;   PERITONEAL CATHETER INSERTION  2011    Current Medications: Current Meds  Medication Sig   allopurinol (ZYLOPRIM) 300 MG tablet TAKE 1 TABLET BY MOUTH DAILY TO PREVENT GOUT   Ascorbic Acid (VITAMIN C) 1000 MG tablet Take 1,000 mg by mouth daily.   atorvastatin (LIPITOR) 40 MG tablet Take 1 tab daily for cholesterol.   brimonidine (ALPHAGAN) 0.2 % ophthalmic solution SMARTSIG:In Eye(s)   buPROPion (WELLBUTRIN XL) 300 MG 24 hr tablet Take  1 tablet  Daily for Mood, Focus & Concentration                                         /         TAKE                      BY                      MOUTH   finasteride (PROSCAR) 5 MG tablet Take  1 tablet  Daily  for Prostate   gabapentin (NEURONTIN) 300 MG capsule Take  1 capsule  3 x /day for  Pain                                                                                              /  TAKE                                         BY                                                 MOUTH   glucose blood (FREESTYLE TEST STRIPS) test strip Check blood sugar 3 to 4 times daily for  medication regulation.   insulin NPH-regular Human (NOVOLIN 70/30) (70-30) 100 UNIT/ML injection Inject 12-22 Units into the skin in the morning and at bedtime. 22 units in the AM, 12-15 units in the PM.   latanoprost (XALATAN) 0.005 % ophthalmic solution Place 1 drop into both eyes at bedtime.    Magnesium 500 MG TABS Take 1 tablet (500 mg total) by mouth daily.   mycophenolate (MYFORTIC) 360 MG TBEC EC tablet TAKE 1 TABLET BY MOUTH TWICE DAILY   spironolactone (ALDACTONE) 25 MG tablet TAKE ONE-HALF (0.5) TABLET BY MOUTH DAILY   tacrolimus (PROGRAF) 0.5 MG capsule Take 0.5 mg by mouth 2 (two) times daily. With 1mg  capsule for total of 1.5mg  twice daily   tacrolimus (PROGRAF) 1 MG capsule Take 1 mg by mouth 2 (two) times daily. With  0.5g capsule for total of 1.5mg  twice daily   tamsulosin (FLOMAX) 0.4 MG CAPS capsule Take 0.4 mg by mouth daily.   traZODone (DESYREL) 50 MG tablet TAKE ONE TABLET BY MOUTH DAILY ONE HOUR BEFORE BEDTIME AS NEEDED FOR SLEEP   valsartan (DIOVAN) 80 MG tablet Take 1 tablet (80 mg total) by mouth daily.   warfarin (COUMADIN) 5 MG tablet Take  1 tab daily     Allergies:   Losartan, Crestor [rosuvastatin], Lorazepam, and Morphine and codeine   Social History   Socioeconomic History   Marital status: Married    Spouse name: Not on file   Number of children: 1   Years of education: Not on file   Highest education level: Not on file  Occupational History   Occupation: retired  Tobacco Use   Smoking status: Former    Current packs/day: 0.00    Average packs/day: 2.0 packs/day for 24.6 years (49.3 ttl pk-yrs)    Types: Cigarettes    Start date: 59    Quit date: 11/29/1990    Years since quitting: 32.1   Smokeless tobacco: Never  Vaping Use   Vaping status: Never Used  Substance and Sexual Activity   Alcohol use: Yes    Comment: 1-2 a month   Drug use: No   Sexual activity: Not on file  Other Topics Concern   Not on file  Social History Narrative   Not  on file   Social Determinants of Health   Financial Resource Strain: Not on file  Food Insecurity: Low Risk  (10/31/2022)   Received from [facility]   Hunger Vital Sign    Worried About Running Out of Food in the Last Year: Never true    Ran Out of Food in the Last Year: Never true  Transportation Needs: Not on file (10/31/2022)  Physical Activity: Not on file  Stress: Not on file  Social Connections: Not on file     Family History: The patient's family history includes Alcohol abuse in his brother and  maternal grandfather; Alcoholism in his brother; Alzheimer's disease in his brother; COPD in his brother and mother; Diabetes in his mother; Heart attack in his father; Kidney disease in his sister. There is no history of Colon cancer, Stomach cancer, Rectal cancer, Esophageal cancer, Liver cancer, or Pancreatic cancer.  ROS:   Please see the history of present illness.     All other systems reviewed and are negative.  EKGs/Labs/Other Studies Reviewed:    The following studies were reviewed today:     Recent Labs: 07/16/2022: TSH 2.41 12/02/2022: ALT 12; BUN 34; Creat 1.27; Hemoglobin 12.1; Magnesium 2.4; Platelets 117; Potassium 4.8; Sodium 139  Recent Lipid Panel    Component Value Date/Time   CHOL 133 12/02/2022 1126   CHOL 158 03/15/2013 0744   TRIG 36 12/02/2022 1126   HDL 76 12/02/2022 1126   HDL 61 03/15/2013 0744   CHOLHDL 1.8 12/02/2022 1126   VLDL 17 09/09/2016 1042   LDLCALC 47 12/02/2022 1126    Physical Exam:     Physical Exam: Blood pressure 134/62, pulse 62, height 5\' 8"  (1.727 m), weight 173 lb 3.2 oz (78.6 kg), SpO2 97%.     GEN:  Well nourished, well developed in no acute distress HEENT: Normal NECK: No JVD;  soft carotid bruit on left  LYMPHATICS: No lymphadenopathy CARDIAC: RRR , 2/6 systolic  RESPIRATORY:  Clear to auscultation without rales, wheezing or rhonchi  ABDOMEN: Soft, non-tender, non-distended MUSCULOSKELETAL:  No edema; No  deformity  SKIN: Warm and dry NEUROLOGIC:  Alert and oriented x 3     EKG:        ASSESSMENT:    1. Paroxysmal atrial fibrillation (HCC)   2. Coronary artery disease involving coronary bypass graft of native heart without angina pectoris   3. Chronic combined systolic and diastolic CHF (congestive heart failure) (HCC)   4. Essential hypertension       PLAN:      First-degree AV block:    with RBBB and LAHB.        2.  Coronary disease:   no angina status post bypass grafting in 2014.    3.  Chronic combined CHF: His EF is 45 to 50%.  We started him on Entresto but he was not able to afford it long-term so he is now on valsartan.   4.  Hyperlipidemia:  .  Overall lipids look good.  LDL is  47   Continue atorvastatin    5.  Paroxysmal atrial fibrillation:    he is on warfarin.  Managed by Dr. Oneta Rack   6.  Mild aortic stenosis with mild aortic insufficiency.   Valve sounds stable     Medication Adjustments/Labs and Tests Ordered: Current medicines are reviewed at length with the patient today.  Concerns regarding medicines are outlined above.  No orders of the defined types were placed in this encounter.  No orders of the defined types were placed in this encounter.    Patient Instructions  Medication Instructions:  Your physician recommends that you continue on your current medications as directed. Please refer to the Current Medication list given to you today.  *If you need a refill on your cardiac medications before your next appointment, please call your pharmacy*  Lab Work: None ordered today.  Testing/Procedures: Your physician has requested that you have a carotid duplex in May 2025. This test is an ultrasound of the carotid arteries in your neck. It looks at blood flow through these arteries that supply  the brain with blood. Allow one hour for this exam. There are no restrictions or special instructions.   Follow-Up: At [facility], you and your  health needs are our priority.  As part of our continuing mission to provide you with exceptional heart care, we have created designated Provider Care Teams.  These Care Teams include your primary Cardiologist (physician) and Advanced Practice Providers (APPs -  Physician Assistants and Nurse Practitioners) who all work together to provide you with the care you need, when you need it.  Your next appointment:   1 year(s)  The format for your next appointment:   In Person  Provider:   Kristeen Miss, MD  or Jari Favre, PA-C, Ronie Spies, PA-C, Robin Searing, NP, Eligha Bridegroom, NP, Tereso Newcomer, PA-C, or Perlie Gold, PA-C    Signed, Kristeen Miss, MD  01/17/2023 10:43 AM    [facility]

## 2023-01-17 ENCOUNTER — Encounter: Payer: Self-pay | Admitting: Cardiovascular Disease

## 2023-01-17 ENCOUNTER — Ambulatory Visit: Payer: Medicare Other | Attending: Cardiovascular Disease | Admitting: Cardiovascular Disease

## 2023-01-17 VITALS — BP 134/62 | HR 62 | Ht 68.0 in | Wt 173.2 lb

## 2023-01-17 DIAGNOSIS — I25810 Atherosclerosis of coronary artery bypass graft(s) without angina pectoris: Secondary | ICD-10-CM | POA: Diagnosis present

## 2023-01-17 DIAGNOSIS — I480 Paroxysmal atrial fibrillation: Secondary | ICD-10-CM | POA: Diagnosis present

## 2023-01-17 DIAGNOSIS — I5042 Chronic combined systolic (congestive) and diastolic (congestive) heart failure: Secondary | ICD-10-CM | POA: Diagnosis present

## 2023-01-17 DIAGNOSIS — I10 Essential (primary) hypertension: Secondary | ICD-10-CM | POA: Diagnosis present

## 2023-01-17 NOTE — Patient Instructions (Signed)
Medication Instructions:  Your physician recommends that you continue on your current medications as directed. Please refer to the Current Medication list given to you today.  *If you need a refill on your cardiac medications before your next appointment, please call your pharmacy*  Lab Work: None ordered today.  Testing/Procedures: Your physician has requested that you have a carotid duplex in May 2025. This test is an ultrasound of the carotid arteries in your neck. It looks at blood flow through these arteries that supply the brain with blood. Allow one hour for this exam. There are no restrictions or special instructions.   Follow-Up: At [facility], you and your health needs are our priority.  As part of our continuing mission to provide you with exceptional heart care, we have created designated Provider Care Teams.  These Care Teams include your primary Cardiologist (physician) and Advanced Practice Providers (APPs -  Physician Assistants and Nurse Practitioners) who all work together to provide you with the care you need, when you need it.  Your next appointment:   1 year(s)  The format for your next appointment:   In Person  Provider:   Kristeen Miss, MD  or Jari Favre, PA-C, Ronie Spies, PA-C, Robin Searing, NP, Eligha Bridegroom, NP, Tereso Newcomer, PA-C, or Perlie Gold, PA-C

## 2023-01-20 ENCOUNTER — Ambulatory Visit (INDEPENDENT_AMBULATORY_CARE_PROVIDER_SITE_OTHER): Payer: Medicare Other | Admitting: Psychology

## 2023-01-20 DIAGNOSIS — F4321 Adjustment disorder with depressed mood: Secondary | ICD-10-CM

## 2023-01-20 NOTE — Progress Notes (Signed)
Behavioral Health Counselor Initial Adult Exam  Name: Robert Arnold. Date: 01/20/2023 MRN: [id number] DOB: [birth year] PCP: Robert Cowboy, MD    Robert Arnold. participated from home, via video, and consented to treatment. Therapist participated from home office. We met online due to COVID pandemic.   Guardian/Payee:  N/A    Paperwork requested: No   Reason for Visit /Presenting Problem: emotional adjustment associated with recent marital separation  Mental Status Exam: Appearance:   Casual     Behavior:  Appropriate  Motor:  Normal  Speech/Language:   Normal Rate  Affect:  Appropriate  Mood:  depressed  Thought process:  normal  Thought content:    WNL  Sensory/Perceptual disturbances:    WNL  Orientation:  oriented to person, place, and situation  Attention:  Good  Concentration:  Good  Memory:  WNL  Fund of knowledge:   Good  Insight:    unknown  Judgment:   unknown  Impulse Control:  Good     Reported Symptoms:  sad, depressed, anxious  Risk Assessment: Danger to Self:  No Self-injurious Behavior: No Danger to Others: No Duty to Warn:no Physical Aggression / Violence:No  Access to Firearms a concern:  unknown Gang Involvement:No  Patient / guardian was educated about steps to take if suicide or homicide risk level increases between visits: n/a While future psychiatric events cannot be accurately predicted, the patient does not currently require acute inpatient psychiatric care and does not currently meet [facility] involuntary commitment criteria.  Substance Abuse History: Current substance abuse: No     Past Psychiatric History:   No previous psychological problems have been observed Outpatient Providers:N/A History of Psych Hospitalization: No  Psychological Testing:  N/A    Abuse History:  Victim of: No.,  N/A    Report needed: No. Victim of  Neglect:No. Perpetrator of  N/A   Witness / Exposure to Domestic Violence: No   Protective Services Involvement: No  Witness to [employer] Violence:  No   Family History:  Family History  Problem Relation Age of Onset   Diabetes Mother    COPD Mother    Heart attack Father        Unsure    Kidney disease Sister    Alcoholism Brother    Alzheimer's disease Brother    Alcohol abuse Brother    COPD Brother    Alcohol abuse Maternal Grandfather    Colon cancer Neg Hx    Stomach cancer Neg Hx    Rectal cancer Neg Hx    Esophageal cancer Neg Hx    Liver cancer Neg Hx    Pancreatic cancer Neg Hx     Living situation: the patient lives with their spouse  Sexual Orientation: Straight  Relationship Status: separated  Name of spouse / other:Robert Arnold If a parent, number of children / ages:adult step-son  Support Systems: minimal support  Financial Stress:  No   Income/Employment/Disability: Neurosurgeon:  unknown  Educational History: Education:  unknown  Oncologist: unknown  Any cultural differences that may affect / interfere with treatment:  not applicable   Recreation/Hobbies: exercise  Stressors: Marital or family conflict    Strengths: unknown  Barriers:  social isolation   Legal History: Pending legal issue / charges: The  patient has no significant history of legal issues. History of legal issue / charges:  N/A  Medical History/Surgical History: not reviewed Past Medical History:  Diagnosis Date   Adenomatous colon polyp    Allergy    BPH (benign prostatic hyperplasia)    CAD (coronary artery disease)    a. NSTEMI 09/2011 s/p PCI to LAD. b. CABG 01/2012 @ [facility].   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   Essential hypertension    FHx: heart disease 02/27/2018   Glaucoma    Gout    Histoplasmosis    on  itraconazole for prophylaxis   Ischemic cardiomyopathy    Mild aortic stenosis    Mild dilation of ascending aorta (HCC)    NSTEMI (non-ST elevated myocardial infarction) (HCC) 11/26/2013   2013    OSA (obstructive sleep apnea)    PAF (paroxysmal atrial fibrillation) (HCC)    Testosterone deficiency 02/27/2018   Trifascicular block     Past Surgical History:  Procedure Laterality Date   ANTERIOR CERVICAL DECOMP/DISCECTOMY FUSION N/A 05/04/2018   Procedure: Cervical Five-Six Anterior cervical decompression/discectomy/fusion;  Surgeon: Robert Kelly, MD;  Location: [facility] OR;  Service: Neurosurgery;  Laterality: N/A;  Cervical Five-Six Anterior cervical decompression/discectomy/fusion   AV FISTULA PLACEMENT  2011   Left forearm   COLONOSCOPY     CORONARY ANGIOPLASTY WITH STENT PLACEMENT     CORONARY ARTERY BYPASS GRAFT  2013   KIDNEY TRANSPLANT  09/25/2011   LAPAROSCOPIC APPENDECTOMY N/A 04/06/2021   Procedure: APPENDECTOMY LAPAROSCOPIC;  Surgeon: Robert Loron, MD;  Location: [facility] OR;  Service: General;  Laterality: N/A;   PERITONEAL CATHETER INSERTION  2011    Medications: Current Outpatient Medications  Medication Sig Dispense Refill   allopurinol (ZYLOPRIM) 300 MG tablet TAKE 1 TABLET BY MOUTH DAILY TO PREVENT GOUT 90 tablet 3   Ascorbic Acid (VITAMIN C) 1000 MG tablet Take 1,000 mg by mouth daily.     atorvastatin (LIPITOR) 40 MG tablet Take 1 tab daily for cholesterol. 90 tablet 3   brimonidine (ALPHAGAN) 0.2 % ophthalmic solution SMARTSIG:In Eye(s)     buPROPion (WELLBUTRIN XL) 300 MG 24 hr tablet Take  1 tablet  Daily for Mood, Focus & Concentration                                         /         TAKE                      BY                      MOUTH 90 tablet 3   finasteride (PROSCAR) 5 MG tablet Take  1 tablet  Daily  for Prostate 90 tablet 3   gabapentin (NEURONTIN) 300 MG capsule Take  1 capsule  3 x /day for  Pain                                                                                               /  TAKE                                         BY                                                 MOUTH 270 capsule 3   glucose blood (FREESTYLE TEST STRIPS) test strip Check blood sugar 3 to 4 times daily for medication regulation. 450 each PRN   insulin NPH-regular Human (NOVOLIN 70/30) (70-30) 100 UNIT/ML injection Inject 12-22 Units into the skin in the morning and at bedtime. 22 units in the AM, 12-15 units in the PM. 10 mL 99   latanoprost (XALATAN) 0.005 % ophthalmic solution Place 1 drop into both eyes at bedtime.   4   Magnesium 500 MG TABS Take 1 tablet (500 mg total) by mouth daily. 30 tablet    mycophenolate (MYFORTIC) 360 MG TBEC EC tablet TAKE 1 TABLET BY MOUTH TWICE DAILY 180 tablet 1   spironolactone (ALDACTONE) 25 MG tablet TAKE ONE-HALF (0.5) TABLET BY MOUTH DAILY 45 tablet 3   tacrolimus (PROGRAF) 0.5 MG capsule Take 0.5 mg by mouth 2 (two) times daily. With 1mg  capsule for total of 1.5mg  twice daily     tacrolimus (PROGRAF) 1 MG capsule Take 1 mg by mouth 2 (two) times daily. With  0.5g capsule for total of 1.5mg  twice daily     tamsulosin (FLOMAX) 0.4 MG CAPS capsule Take 0.4 mg by mouth daily.     traZODone (DESYREL) 50 MG tablet TAKE ONE TABLET BY MOUTH DAILY ONE HOUR BEFORE BEDTIME AS NEEDED FOR SLEEP 90 tablet 3   valsartan (DIOVAN) 80 MG tablet Take 1 tablet (80 mg total) by mouth daily. 90 tablet 3   warfarin (COUMADIN) 5 MG tablet Take  1 tab daily     No current facility-administered medications for this visit.    Allergies  Allergen Reactions   Losartan Anaphylaxis   Crestor [Rosuvastatin] Other (See Comments)    Elevated LFT's   Lorazepam     Pt is unsure of reaction    Morphine And Codeine Other (See Comments)    Has no effect on pt   Initial Note: Patient says his wife of 48 years, Dewayne Hatch, has always had mental health issues. In Feb., "the wheels came off"  and she committed herself to psychiatric hospital in April. She was in 10-12 days and a week later she tried to commit suicide (March 31st) with pills. He called 911 and she was back in hospital at [facility] and then transferred to [facility]. She was in hospital in [place] because they said they did not have beds. He says he visited her every night. After several days, she told him not to come because their son was visiting. After discharge, she told him that she wasn't coming home and she went to live with their son. His (step)son did not initially give him any information. Their son is now giving him information. His sister in law will not return his calls. He says that his wife accuses him of things that have never happened. She claims that he has been mentally abusing her. Wife is seeing a psychologist in Dr. Alwyn Ren office. Son Leonette Most is now talking with him and  tells him he knows that his mother is fabricating things. He says he loves hs wife and wants her back. She tells him she wants to come back at some point and they are seeing each other regularly (3-4 days/week). He said before recent situation, marriage had "normal ups and downs". Never separated in the past. Her mental health issues were primarily depression. Wife's second marriage and his first.  He says he was "terrible" at the beginning of her decline. He has been fighting depression and has to keep busy. Often watches TV, house projects and [facility] [employer]. Is involved at the church. He has no social network and is lonely. Wife is making friends. When he is with his wife, he feels uptight out of fear of pushing her away more. Walking on eggshells because she is so sensitive to what he says.  FOO: half sister, full sister and a brother. Very close with half sister, but brother "disowned" him (as of 6-8 weeks ago) and sister won't talk with him. She disowned him for 7 years. They reconnected and then she cut him off again. Ashby Dawes of relationship  problems with sibs is not clear.  Used to play golf and bowl, but he has a bad knee that keeps him from both.  He had kidney transplant 11 years ago. Close with grandson and his 2 great grandchildren (boy and girl). No alcohol or other substances. His father, and 4 brothers were all alcoholic.  Fa. passed 30 years ago and mother 10 years ago. Never treated for any mental health issues. He is seeking to manage feelings associated with marital separation. Wife has been at [facility]' house X 3 months.   Tx. Plan/goals: Patient is seeking to reduce depressive symptoms of sadness, helplessness and despair associated the the recent unwanted marital separation. In addition, he expresses the need to better understand how to foster a better relationship with spouse to facilitate their anticipated reconciliation. Treatment will utilize systems oriented counseling and insight oriented therapy. Goal date is 3-24.  Session Note: Robert Arnold says that things are up and down with his wife. He says things will be going fine and she launches into past issues that he says are not in reality but she believes did happen. Her psychologist is trying to help her get past those "fabricated" issues. He went 1 time with her to her psychologist, but doesn't want him to go back. Her psychologist suggested they spend a couple of nights together, but she backed out saying she wasn't ready. He is walking on eggshells with his wife. This is the basis of his stress. Wife's distortions are elaborate and she accuses him of extreme behaviors that he claims have no basis in reality. I suggested that he request a neuropsychological evaluation.  Discussed his anxiety. He and his sister are talking everyday, which helps him manage his stress. It helps him to have someone to unload with. [facility] [employer] has also helped him manage his stress. Sleep is disruptive. He is taking Trazadone and Gabapentin.               Diagnoses:  Adjustment Disorder  with depressed mood  Plan of Care: outpatient psychotherapy (individual) and referral to conjoint if needed   Garrel Ridgel, PhD  11:35a-12:30p 55 minutes

## 2023-01-30 ENCOUNTER — Ambulatory Visit: Payer: Medicare Other | Admitting: Psychology

## 2023-01-30 DIAGNOSIS — F4321 Adjustment disorder with depressed mood: Secondary | ICD-10-CM | POA: Diagnosis not present

## 2023-01-30 NOTE — Progress Notes (Addendum)
[facility] Counselor Initial Adult Exam  Name: Newell Olander. Date: 01/30/2023 MRN: [id number] DOB: 01/08/1946 PCP: [name], MD    [name]. participated from home, via video, and consented to treatment. Therapist participated from home office. We met online due to COVID pandemic.   Guardian/Payee:  N/A    Paperwork requested: No   Reason for Visit /Presenting Problem: emotional adjustment associated with recent marital separation  Mental Status Exam: Appearance:   Casual     Behavior:  Appropriate  Motor:  Normal  Speech/Language:   Normal Rate  Affect:  Appropriate  Mood:  depressed  Thought process:  normal  Thought content:    WNL  Sensory/Perceptual disturbances:    WNL  Orientation:  oriented to person, place, and situation  Attention:  Good  Concentration:  Good  Memory:  WNL  Fund of knowledge:   Good  Insight:    unknown  Judgment:   unknown  Impulse Control:  Good     Reported Symptoms:  sad, depressed, anxious  Risk Assessment: Danger to Self:  No Self-injurious Behavior: No Danger to Others: No Duty to Warn:no Physical Aggression / Violence:No  Access to Firearms a concern:  unknown Gang Involvement:No  Patient / guardian was educated about steps to take if suicide or homicide risk level increases between visits: n/a While future psychiatric events cannot be accurately predicted, the patient does not currently require acute inpatient psychiatric care and does not currently meet [facility] involuntary commitment criteria.  Substance Abuse History: Current substance abuse: No     Past Psychiatric History:   No previous psychological problems have been observed Outpatient Providers:N/A History of Psych Hospitalization: No  Psychological Testing:  N/A    Abuse History:  Victim of: No.,  N/A    Report needed:  No. Victim of Neglect:No. Perpetrator of  N/A   Witness / Exposure to Domestic Violence: No   Protective Services Involvement: No  Witness to [employer] Violence:  No   Family History:  Family History  Problem Relation Age of Onset   Diabetes Mother    COPD Mother    Heart attack Father        Unsure    Kidney disease Sister    Alcoholism Brother    Alzheimer's disease Brother    Alcohol abuse Brother    COPD Brother    Alcohol abuse Maternal Grandfather    Colon cancer Neg Hx    Stomach cancer Neg Hx    Rectal cancer Neg Hx    Esophageal cancer Neg Hx    Liver cancer Neg Hx    Pancreatic cancer Neg Hx     Living situation: the patient lives with their spouse  Sexual Orientation: Straight  Relationship Status: separated  Name of spouse / other:[name] If a parent, number of children / ages:adult step-son  Support Systems: minimal support  Financial Stress:  No   Income/Employment/Disability: Neurosurgeon:  unknown  Educational History: Education:  unknown  Religion/Sprituality/World View: unknown  Any cultural differences that may affect / interfere with treatment:  not applicable   Recreation/Hobbies: exercise  Stressors: Marital or family conflict    Strengths: unknown  Barriers:  social isolation   Legal History: Pending legal issue / charges: The patient has no significant history of legal issues. History of legal issue / charges:  N/A  Medical History/Surgical History: not reviewed Past Medical History:  Diagnosis Date   Adenomatous colon polyp    Allergy    BPH (benign prostatic hyperplasia)    CAD (coronary artery disease)    a. NSTEMI 09/2011 s/p PCI to LAD. b. CABG 01/2012 @ [facility].   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   Essential hypertension    FHx: heart disease 02/27/2018   Glaucoma    Gout     Histoplasmosis    on itraconazole for prophylaxis   Ischemic cardiomyopathy    Mild aortic stenosis    Mild dilation of ascending aorta (HCC)    NSTEMI (non-ST elevated myocardial infarction) (HCC) 11/26/2013   2013    OSA (obstructive sleep apnea)    PAF (paroxysmal atrial fibrillation) (HCC)    Testosterone deficiency 02/27/2018   Trifascicular block     Past Surgical History:  Procedure Laterality Date   ANTERIOR CERVICAL DECOMP/DISCECTOMY FUSION N/A 05/04/2018   Procedure: Cervical Five-Six Anterior cervical decompression/discectomy/fusion;  Surgeon: Shirlean Kelly, MD;  Location: [facility] OR;  Service: Neurosurgery;  Laterality: N/A;  Cervical Five-Six Anterior cervical decompression/discectomy/fusion   AV FISTULA PLACEMENT  2011   Left forearm   COLONOSCOPY     CORONARY ANGIOPLASTY WITH STENT PLACEMENT     CORONARY ARTERY BYPASS GRAFT  2013   KIDNEY TRANSPLANT  09/25/2011   LAPAROSCOPIC APPENDECTOMY N/A 04/06/2021   Procedure: APPENDECTOMY LAPAROSCOPIC;  Surgeon: Emelia Loron, MD;  Location: [facility] OR;  Service: General;  Laterality: N/A;   PERITONEAL CATHETER INSERTION  2011    Medications: Current Outpatient Medications  Medication Sig Dispense Refill   allopurinol (ZYLOPRIM) 300 MG tablet TAKE 1 TABLET BY MOUTH DAILY TO PREVENT GOUT 90 tablet 3   Ascorbic Acid (VITAMIN C) 1000 MG tablet Take 1,000 mg by mouth daily.     atorvastatin (LIPITOR) 40 MG tablet Take 1 tab daily for cholesterol. 90 tablet 3   brimonidine (ALPHAGAN) 0.2 % ophthalmic solution SMARTSIG:In Eye(s)     buPROPion (WELLBUTRIN XL) 300 MG 24 hr tablet Take  1 tablet  Daily for Mood, Focus & Concentration                                         /         TAKE                      BY                      MOUTH 90 tablet 3   finasteride (PROSCAR) 5 MG tablet Take  1 tablet  Daily  for Prostate 90 tablet 3   gabapentin (NEURONTIN) 300 MG capsule Take  1 capsule  3 x /day for  Pain                                                                                               /  TAKE                                         BY                                                 MOUTH 270 capsule 3   glucose blood (FREESTYLE TEST STRIPS) test strip Check blood sugar 3 to 4 times daily for medication regulation. 450 each PRN   insulin NPH-regular Human (NOVOLIN 70/30) (70-30) 100 UNIT/ML injection Inject 12-22 Units into the skin in the morning and at bedtime. 22 units in the AM, 12-15 units in the PM. 10 mL 99   latanoprost (XALATAN) 0.005 % ophthalmic solution Place 1 drop into both eyes at bedtime.   4   Magnesium 500 MG TABS Take 1 tablet (500 mg total) by mouth daily. 30 tablet    mycophenolate (MYFORTIC) 360 MG TBEC EC tablet TAKE 1 TABLET BY MOUTH TWICE DAILY 180 tablet 1   spironolactone (ALDACTONE) 25 MG tablet TAKE ONE-HALF (0.5) TABLET BY MOUTH DAILY 45 tablet 3   tacrolimus (PROGRAF) 0.5 MG capsule Take 0.5 mg by mouth 2 (two) times daily. With 1mg  capsule for total of 1.5mg  twice daily     tacrolimus (PROGRAF) 1 MG capsule Take 1 mg by mouth 2 (two) times daily. With  0.5g capsule for total of 1.5mg  twice daily     tamsulosin (FLOMAX) 0.4 MG CAPS capsule Take 0.4 mg by mouth daily.     traZODone (DESYREL) 50 MG tablet TAKE ONE TABLET BY MOUTH DAILY ONE HOUR BEFORE BEDTIME AS NEEDED FOR SLEEP 90 tablet 3   valsartan (DIOVAN) 80 MG tablet Take 1 tablet (80 mg total) by mouth daily. 90 tablet 3   warfarin (COUMADIN) 5 MG tablet Take  1 tab daily     No current facility-administered medications for this visit.    Allergies  Allergen Reactions   Losartan Anaphylaxis   Crestor [Rosuvastatin] Other (See Comments)    Elevated LFT's   Lorazepam     Pt is unsure of reaction    Morphine And Codeine Other (See Comments)    Has no effect on pt   Initial Note: Patient says his wife of 48 years, Dewayne Hatch, has always had mental health issues. In Feb.,  "the wheels came off" and she committed herself to psychiatric hospital in April. She was in 10-12 days and a week later she tried to commit suicide (March 31st) with pills. He called 911 and she was back in hospital at [facility] and then transferred to [facility]. She was in hospital in [place] because they said they did not have beds. He says he visited her every night. After several days, she told him not to come because their son was visiting. After discharge, she told him that she wasn't coming home and she went to live with their son. His (step)son did not initially give him any information. Their son is now giving him information. His sister in law will not return his calls. He says that his wife accuses him of things that have never happened. She claims that he has been mentally abusing her. Wife is seeing a psychologist in Dr. Alwyn Ren office. Son Leonette Most is now talking with him and  tells him he knows that his mother is fabricating things. He says he loves hs wife and wants her back. She tells him she wants to come back at some point and they are seeing each other regularly (3-4 days/week). He said before recent situation, marriage had "normal ups and downs". Never separated in the past. Her mental health issues were primarily depression. Wife's second marriage and his first.  He says he was "terrible" at the beginning of her decline. He has been fighting depression and has to keep busy. Often watches TV, house projects and workouts at the [employer]. Is involved at the church. He has no social network and is lonely. Wife is making friends. When he is with his wife, he feels uptight out of fear of pushing her away more. Walking on eggshells because she is so sensitive to what he says.  FOO: half sister, full sister and a brother. Very close with half sister, but brother "disowned" him (as of 6-8 weeks ago) and sister won't talk with him. She disowned him for 7 years. They reconnected and then she cut him off again. Ashby Dawes  of relationship problems with sibs is not clear.  Used to play golf and bowl, but he has a bad knee that keeps him from both.  He had kidney transplant 11 years ago. Close with grandson and his 2 great grandchildren (boy and girl). No alcohol or other substances. His father, and 4 brothers were all alcoholic.  Fa. passed 30 years ago and mother 10 years ago. Never treated for any mental health issues. He is seeking to manage feelings associated with marital separation. Wife has been at [facility] [employer]' house X 3 months.   Tx. Plan/goals: Patient is seeking to reduce depressive symptoms of sadness, helplessness and despair associated the the recent unwanted marital separation. In addition, he expresses the need to better understand how to foster a better relationship with spouse to facilitate their anticipated reconciliation. Treatment will utilize systems oriented counseling and insight oriented therapy. Goal date is 3-25.  Patient was seen in a video session and he is aware of the platform limitations. He is at home and provider in his office.   Session Note: [name] wants to know if it is possible that he has done the things his wife acuses and that he doesn't remember. There is no corroborating evidence in his interactions with her or anyone else. She feels that he is gas-lighting her and provides a long list of examples that he does not recall. Has been meeting with her almost daily. He says they have great time together during the day, but then it falls apart in the evening. She tells him she wants to come home, but "isn't ready". He is most upset that she tells him she cannot trust him.  He states he is trying not to dwell on the situation and is trying to keep busy and distracted. Feels like crying much of the time. He tells her that she doesn't deserve the life she was dealt with having to deal with mental illness. He feels that his wife "is not the same person she was before February". She is more outgoing,  which he says is positive. She is more independent than he has ever seen and he likes that about her. [name] Is still going to the church and the Y regularly and it is helpful. He is feeling lonely and he misses having her home. He is also speaking to his sister Inetta Fermo every day and finds  it helpful. Wife Dewayne Hatch is unaware of this because she would not like him "telling her everything".                   Diagnoses:  Adjustment Disorder with depressed mood  Plan of Care: outpatient psychotherapy (individual) and referral to conjoint if needed   Garrel Ridgel, PhD  10:35a-11:30a 55 minutes

## 2023-02-04 NOTE — Progress Notes (Unsigned)
Assessment and Plan:  Robert Arnold was seen today for follow-up.  Diagnoses and all orders for this visit:  Chronic atrial fibrillation (HCC) Continue Valsartan and Coumadin -     Protime-INR  Thrombocytopenia (HCC) Continue Coumadin and will adjust dose based on PT/INR results -     Protime-INR  Type 2 diabetes mellitus with stage 2 chronic kidney disease, with long-term current use of insulin (HCC) Continue medications:Novolin 70/30 20 units in am , 11-12 units in PM Monitor BS and if continues to have low values will plan to lower insulin Continue diet and exercise.  Perform daily foot/skin check, notify office of any concerning changes.   Senile purpura (HCC) Use sunscreen, try to avoid injuries  Medication management Medications reviewed and Questions answered    Low Back Pain Continue to follow with orthopedics    Further disposition pending results of labs. Discussed med's effects and SE's.   Over 30 minutes of exam, counseling, chart review, and critical decision making was performed.   Future Appointments  Date Time Provider Department Center  03/07/2023  8:30 AM Haze Rushing, PhD [facility] None  07/16/2023  9:00 AM Adela Glimpse, NP [facility] None  12/02/2023 11:00 AM Raynelle Dick, NP [facility] None    ------------------------------------------------------------------------------------------------------------------   HPI BP (!) 106/58   Pulse (!) 58   Temp (!) 97.5 F (36.4 C)   Ht 5\' 7"  (1.702 m)   Wt 176 lb (79.8 kg)   SpO2 97%   BMI 27.57 kg/m  77 y.o.male presents for reevaluation of BP and to draw PT/INR.   He went to sit on a high stool and he slipped and fell on lower back.  He has been seen by ortho- and has follow up visit in 2 weeks.   BP is currently well controlled with Valsartan 80 mg every day  BP Readings from Last 3 Encounters:  02/06/23 (!) 106/58  01/17/23 134/62  01/02/23 (!) 146/66  Denies headaches, chest pain,  shortness of breath and dizziness  Taking Coumadin 5 mg daily except Sun and Thurs take 1/2 tab Lab Results  Component Value Date   INR 1.2 (H) 01/02/2023   INR 3.1 (H) 12/02/2022   INR 1.8 (H) 09/24/2022     He has been having several low blood sugars- in the 40's.  Will normally drink OJ and glucose tabs and BS returns to normal. Daytime BS 85-125 Novolin 70/30 20 units in am , 11-12 units in PM Lab Results  Component Value Date   HGBA1C 5.8 (H) 07/16/2022    Past Medical History:  Diagnosis Date   Adenomatous colon polyp    Allergy    BPH (benign prostatic hyperplasia)    CAD (coronary artery disease)    a. NSTEMI 09/2011 s/p PCI to LAD. b. CABG 01/2012 @ [facility].   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   Essential hypertension    FHx: heart disease 02/27/2018   Glaucoma    Gout    Histoplasmosis    on itraconazole for prophylaxis   Ischemic cardiomyopathy    Mild aortic stenosis    Mild dilation of ascending aorta (HCC)    NSTEMI (non-ST elevated myocardial infarction) (HCC) 11/26/2013   2013    OSA (obstructive sleep apnea)    PAF (paroxysmal atrial fibrillation) (HCC)    Testosterone deficiency 02/27/2018   Trifascicular block  Allergies  Allergen Reactions   Losartan Anaphylaxis   Crestor [Rosuvastatin] Other (See Comments)    Elevated LFT's   Lorazepam     Pt is unsure of reaction    Morphine And Codeine Other (See Comments)    Has no effect on pt     Current Outpatient Medications on File Prior to Visit  Medication Sig   allopurinol (ZYLOPRIM) 300 MG tablet TAKE 1 TABLET BY MOUTH DAILY TO PREVENT GOUT   Ascorbic Acid (VITAMIN C) 1000 MG tablet Take 1,000 mg by mouth daily.   atorvastatin (LIPITOR) 40 MG tablet Take 1 tab daily for cholesterol.   brimonidine (ALPHAGAN) 0.2 % ophthalmic solution SMARTSIG:In Eye(s)   buPROPion (WELLBUTRIN XL) 300 MG 24  hr tablet Take  1 tablet  Daily for Mood, Focus & Concentration                                         /         TAKE                      BY                      MOUTH   finasteride (PROSCAR) 5 MG tablet Take  1 tablet  Daily  for Prostate   gabapentin (NEURONTIN) 300 MG capsule Take  1 capsule  3 x /day for  Pain                                                                                              /                                                                   TAKE                                         BY                                                 MOUTH   glucose blood (FREESTYLE TEST STRIPS) test strip Check blood sugar 3 to 4 times daily for medication regulation.   insulin NPH-regular Human (NOVOLIN 70/30) (70-30) 100 UNIT/ML injection Inject 12-22 Units into the skin in the morning and at bedtime. 22 units in the AM, 12-15 units in the PM.   latanoprost (XALATAN) 0.005 % ophthalmic solution Place 1 drop into both eyes at bedtime.    Magnesium 500 MG TABS Take 1 tablet (500 mg  total) by mouth daily.   mycophenolate (MYFORTIC) 360 MG TBEC EC tablet TAKE 1 TABLET BY MOUTH TWICE DAILY   spironolactone (ALDACTONE) 25 MG tablet TAKE ONE-HALF (0.5) TABLET BY MOUTH DAILY   tacrolimus (PROGRAF) 0.5 MG capsule Take 0.5 mg by mouth 2 (two) times daily. With 1mg  capsule for total of 1.5mg  twice daily   tacrolimus (PROGRAF) 1 MG capsule Take 1 mg by mouth 2 (two) times daily. With  0.5g capsule for total of 1.5mg  twice daily   tamsulosin (FLOMAX) 0.4 MG CAPS capsule Take 0.4 mg by mouth daily.   traZODone (DESYREL) 50 MG tablet TAKE ONE TABLET BY MOUTH DAILY ONE HOUR BEFORE BEDTIME AS NEEDED FOR SLEEP   valsartan (DIOVAN) 80 MG tablet Take 1 tablet (80 mg total) by mouth daily.   warfarin (COUMADIN) 5 MG tablet Take  1 tab daily   No current facility-administered medications on file prior to visit.    ROS: all negative except above.   Physical Exam:  BP (!) 106/58   Pulse (!) 58    Temp (!) 97.5 F (36.4 C)   Ht 5\' 7"  (1.702 m)   Wt 176 lb (79.8 kg)   SpO2 97%   BMI 27.57 kg/m   General Appearance: Well nourished, in no apparent distress. Eyes: PERRLA, EOMs, conjunctiva no swelling or erythema Sinuses: No Frontal/maxillary tenderness ENT/Mouth: Ext aud canals clear, TMs without erythema, bulging. No erythema, swelling, or exudate on post pharynx.  Hard of hearing with hearing aids Neck: Supple, thyroid normal.  Respiratory: Respiratory effort normal, BS equal bilaterally without rales, rhonchi, wheezing or stridor.  Cardio: RRR with no MRGs. Brisk peripheral pulses without edema.  Abdomen: Soft, + BS.  Non tender, no guarding, rebound, hernias, masses. Lymphatics: Non tender without lymphadenopathy.  Musculoskeletal: Full ROM, 5/5 strength. Antalgic gait from fall in which he hit lower back Skin: Warm, dry , ecchymosis of arms Neuro: Cranial nerves intact. Normal muscle tone, no cerebellar symptoms. Sensation intact.  Psych: Awake and oriented X 3, normal affect, Insight and Judgment appropriate.     Raynelle Dick, NP 10:18 AM [facility]

## 2023-02-06 ENCOUNTER — Encounter: Payer: Self-pay | Admitting: Nurse Practitioner

## 2023-02-06 ENCOUNTER — Ambulatory Visit (INDEPENDENT_AMBULATORY_CARE_PROVIDER_SITE_OTHER): Payer: Medicare Other | Admitting: Nurse Practitioner

## 2023-02-06 VITALS — BP 106/58 | HR 58 | Temp 97.5°F | Ht 67.0 in | Wt 176.0 lb

## 2023-02-06 DIAGNOSIS — D692 Other nonthrombocytopenic purpura: Secondary | ICD-10-CM | POA: Diagnosis not present

## 2023-02-06 DIAGNOSIS — N182 Chronic kidney disease, stage 2 (mild): Secondary | ICD-10-CM

## 2023-02-06 DIAGNOSIS — D696 Thrombocytopenia, unspecified: Secondary | ICD-10-CM

## 2023-02-06 DIAGNOSIS — E1122 Type 2 diabetes mellitus with diabetic chronic kidney disease: Secondary | ICD-10-CM

## 2023-02-06 DIAGNOSIS — Z794 Long term (current) use of insulin: Secondary | ICD-10-CM

## 2023-02-06 DIAGNOSIS — M545 Low back pain, unspecified: Secondary | ICD-10-CM

## 2023-02-06 DIAGNOSIS — I482 Chronic atrial fibrillation, unspecified: Secondary | ICD-10-CM | POA: Diagnosis not present

## 2023-02-06 DIAGNOSIS — Z79899 Other long term (current) drug therapy: Secondary | ICD-10-CM

## 2023-02-06 NOTE — Patient Instructions (Signed)
Warfarin Information Warfarin is a blood thinner (anticoagulant). Anticoagulants help to prevent the formation of blood clots or keep them from getting bigger. Your health care provider will monitor the anticoagulation effect of warfarin closely and will adjust your medicine as needed. Who should use warfarin? Warfarin is prescribed for people who have blood clots, or who are at risk for developing harmful blood clots, such as people who: Have mechanical heart valves. Have irregular heart rhythms (atrial fibrillation). Have certain clotting disorders. Have had blood clots in the past or are currently receiving treatment for them. This includes people who have had a stroke, blood clots in the lungs (pulmonary embolism, or PE), or blood clots in the legs (deep vein thrombosis,or DVT). How is warfarin taken? Warfarin is taken by mouth (orally). Warfarin tablets come in different strengths. The strength is printed on the tablet, and each strength is a different color. If you get a new prescription and the color of your tablet is different than usual, tell your pharmacist or health care provider immediately. Take warfarin exactly as told by your health care provider, at the same time every day. Doing this helps you avoid bleeding or blood clots that could result in serious injury, pain, or disability. Contact your health care provider if a dose is forgotten or missed. Do not change or take additional dosesto make up for missed or accidental extra doses. What blood tests do I need while taking warfarin? Warfarin is a medicine that needs to be closely monitored with blood tests. It is very important to keep all lab visits and follow-up visits with your health care provider. These tests measure the blood's ability to clot and are called prothrombin tests (PT)or international normalized ratio (INR) tests. These tests can be done with a finger stick or a blood draw. What does the INR test result mean? The PT  test results will be reported as the INR. Your health care provider will tell you your target INR range. If your INR is not in your target range, your health care provider may adjust your dosage. If your INR is above your target range, there is a risk of bleeding. Your dosage of warfarin may need to be decreased. If your INR is below your target range, there is a risk of clotting. Your dosage of warfarin may need to be increased. How often is the INR test needed? When you first start warfarin, you will usually have your INR checked every few days until the health care provider determines the correct dosage of warfarin. After you have reached your target INR, your INR will be tested less often. However, you will need to have your INR checked at least once every 4-6 weeks while you take warfarin. Some people may be able to use home monitoring to check their INR. Ask your health care provider if this applies to you. What are the side effects of warfarin? Too much warfarin can cause bleeding or hemorrhage in any part of the body, such as: Bleeding from the gums. Unexplained bruises or bruises that get larger. A nosebleed that is not easily stopped. Bleeding in the brain (hemorrhagic stroke). Coughing up or vomiting blood. Blood in the urine or stools. Warfarin may also cause: Skin rash or irritations. Nausea that does not go away. Severe pain in the back or joints. Painful toes that turn blue or purple (purple toe syndrome). Painful ulcers that do not go away (skin necrosis). What precautions do I need to take while using warfarin? Wear  a medical alert bracelet or carry a card that lists what medicines you take. Make sure that all health care providers, including your dentist, know you are taking warfarin. Avoid situations that cause bleeding by: Using a softer toothbrush. Flossing with waxed floss. Shaving with an Neurosurgeon, not with a blade. Limiting your use of sharp  objects. Avoiding activities that put you at risk for injury, such as contact sports. What do I need to know about warfarin and pregnancy or breastfeeding? If you are taking warfarin and you become pregnant, or plan to become pregnant, contact your health care provider right away. Though warfarin has been associated with birth defects, it can be used in some cases after weighing risks to mother and baby. If you plan to breastfeed while taking warfarin, talk with your health care provider first. What do I need to know about warfarin and alcohol or drug use? Do not drink alcohol if: Your health care provider tells you not to drink. You are pregnant, may be pregnant, or are planning to become pregnant. If you drink alcohol: Limit how much you have to: 0-1 drink a day for women. 0-2 drinks a day for men. Know how much alcohol is in your drink. In the [place] one drink equals one 12 oz bottle of beer (355 mL), one 5 oz glass of wine (148 mL), or one 1 oz glass of hard liquor (44 mL). If you change the amount of alcohol that you drink, tell your health care provider. Your warfarin dosage may need to be changed. Do not use any products that contain nicotine or tobacco. These products include cigarettes, chewing tobacco, and vaping devices, such as e-cigarettes. If you need help quitting, ask your health care provider. If you use nicotine or tobacco products and change the amount that you use, tell your health care provider. Your warfarin dosage may need to be changed. Avoid drug use while taking warfarin. The effects of drugs on warfarin are not known. What do I need to know about warfarin and other medicines or supplements? Many prescription and over-the-counter medicines can interfere with warfarin. Talk with your health care provider or your pharmacist before starting or stopping any new medicines. This includes vitamins, herbs, supplements, and pain medicines. Some common over-the-counter medicines  that may increase the risk of dangerous bleeding while taking warfarin include: Aspirin. NSAIDs, such as ibuprofen or naproxen. Vitamin E. Fish oils. What do I need to know about warfarin and my diet? Vitamin K decreases the effect of warfarin, and it is found in many foods. Eat a consistent amount of foods that contain vitamin K. For example, you may decide to eat 2 servings of vitamin K-containing foods each day. It is important to maintain a normal, balanced diet while taking warfarin. Avoid major changes in your diet. If you are going to change your diet, talk with your health care provider before making changes. Your health care provider may recommend that you work with a dietitian. Contact a health care provider if you: Miss a dose. Take an extra dose. Plan to have any kind of surgery or procedure. Ask whether you should stop taking warfarin or change your dose before your surgery. Are unable to take your medicine due to nausea, vomiting, or diarrhea. Have any major changes in your diet, or you plan to make major changes in your diet. Start or stop any over-the-counter medicine, prescription medicine, herbal supplement, or dietary supplement. Become pregnant, plan to become pregnant, or think you may  be pregnant. Have menstrual periods that are heavier than usual. Have unusual bruising. Get help right away if you: Have signs of an allergic reaction, such as: Swelling of the lips, face, tongue, mouth, or throat. Rash or itchy, red, swollen areas of skin (hives). Trouble breathing. Chest tightness. Fall or have an accident, especially if you hit your head. Have signs that your blood is too thin, such as: Blood in your urine. Your urine may look reddish, pinkish, or tea-colored. Blood in your stool. Your stool may be black or bright red. Coughing up or vomiting blood. The blood may be bright red, or it may look like coffee grounds. Bleeding that does not stop after applying pressure  to the area for 30 minutes. Have signs of a blood clot in your leg or arm, such as: Pain or swelling in your leg or arm. Skin that is red or warm to the touch on your arm or leg. Have signs of blood in your lung, such as: Shortness of breath or difficulty breathing. Chest pain. Unexplained fever. Have any symptoms of a stroke. "BE FAST" is an easy way to remember the main warning signs of a stroke: B - Balance. Signs are dizziness, sudden trouble walking, or loss of balance. E - Eyes. Signs are trouble seeing or a sudden change in vision. F - Face. Signs are sudden weakness or numbness of the face, or the face or eyelid drooping on one side. A - Arms. Signs are weakness or numbness in an arm. This happens suddenly and usually on one side of the body. S - Speech. Signs are sudden trouble speaking, slurred speech, or trouble understanding what people say. T - Time. Time to call emergency services. Write down what time symptoms started. Have other signs of a stroke, such as: A sudden, severe headache with no known cause. Nausea or vomiting. Seizure. Have other signs of a reaction to warfarin, such as: Purple or blue toes. Skin ulcers that do not go away. These symptoms may represent a serious problem that is an emergency. Do not wait to see if the symptoms will go away. Get medical help right away. Call your local emergency services (911 in the [place] Do not drive yourself to the hospital. Summary Warfarin is a medicine that thins blood. It is used to prevent or treat blood clots. You must be monitored closely while on this medicine. Keep all follow-up visits. Make sure that you know your target INR range and your warfarin dosage. Wear or carry identification that says you are taking warfarin. Take warfarin at the same time every day. Call your health care provider if you miss a dose or if you take an extra dose. Do not change the dosage of warfarin on your own. Know the signs and symptoms  of blood clots, bleeding, and a stroke. Know when to get emergency medical help. This information is not intended to replace advice given to you by your health care provider. Make sure you discuss any questions you have with your health care provider. Document Revised: 06/14/2020 Document Reviewed: 06/14/2020 Elsevier Patient Education  2024 [employer].

## 2023-02-07 LAB — PROTIME-INR
INR: 1.8 — ABNORMAL HIGH
Prothrombin Time: 18.7 s — ABNORMAL HIGH (ref 9.0–11.5)

## 2023-02-17 ENCOUNTER — Encounter: Payer: Self-pay | Admitting: Internal Medicine

## 2023-02-17 NOTE — Progress Notes (Signed)
Future Appointments  Date Time Provider Department  02/18/2023 10:30 AM [name], MD [facility]  03/07/2023  8:30 AM Haze Rushing, PhD [facility]  03/14/2023                9:30 AM Raynelle Dick, NP [facility]  07/16/2023                    CPE  9:00 AM Adela Glimpse, NP [facility]  12/02/2023                   Wellness 11:00 AM Raynelle Dick, NP [facility]    History of Present Illness:          The patient very nice nice 77 y.o. 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  Actually he reports problems with excessive sleepiness during the day, falling asleep and "napping" for 30-40 minutes 4 to 4 x /day . 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 He has been under excess stress related to his wife's Bipolar Psychiatric illness.     Current Outpatient Medications on File Prior to Visit  Medication Sig   allopurinol (ZYLOPRIM) 300 MG tablet TAKE 1 TABLET BY MOUTH DAILY TO PREVENT GOUT   Ascorbic Acid (VITAMIN C) 1000 MG tablet Take 1,000 mg by mouth daily.   atorvastatin (LIPITOR) 40 MG tablet Take 1 tab daily for cholesterol.   brimonidine (ALPHAGAN) 0.2 % ophthalmic solution SMARTSIG:In Eye(s)   buPROPion (WELLBUTRIN XL) 300 MG 24 hr tablet Take  1 tablet  Daily for Mood, Focus & Concentration                                         /         TAKE                      BY                      MOUTH   finasteride (PROSCAR) 5 MG tablet Take  1 tablet  Daily  for Prostate   gabapentin (NEURONTIN) 300 MG capsule Take  1 capsule  3 x /day for  Pain                                                                                              /                                                                   TAKE  BY                                                 MOUTH    glucose blood (FREESTYLE TEST STRIPS) test strip Check blood sugar 3 to 4 times daily for medication regulation.   insulin NPH-regular Human (NOVOLIN 70/30) (70-30) 100 UNIT/ML injection Inject 12-22 Units into the skin in the morning and at bedtime. 22 units in the AM, 12-15 units in the PM.   latanoprost (XALATAN) 0.005 % ophthalmic solution Place 1 drop into both eyes at bedtime.    Magnesium 500 MG TABS Take 1 tablet (500 mg total) by mouth daily.   mycophenolate (MYFORTIC) 360 MG TBEC EC tablet TAKE 1 TABLET BY MOUTH TWICE DAILY   spironolactone (ALDACTONE) 25 MG tablet TAKE ONE-HALF (0.5) TABLET BY MOUTH DAILY   tacrolimus (PROGRAF) 0.5 MG capsule Take 0.5 mg by mouth 2 (two) times daily. With 1mg  capsule for total of 1.5mg  twice daily   tacrolimus (PROGRAF) 1 MG capsule Take 1 mg by mouth 2 (two) times daily. With  0.5g capsule for total of 1.5mg  twice daily   tamsulosin (FLOMAX) 0.4 MG CAPS capsule Take 0.4 mg by mouth daily.   traZODone (DESYREL) 50 MG tablet TAKE ONE TABLET BY MOUTH DAILY ONE HOUR BEFORE BEDTIME AS NEEDED FOR SLEEP   valsartan (DIOVAN) 80 MG tablet Take 1 tablet (80 mg total) by mouth daily.   warfarin (COUMADIN) 5 MG tablet Take  1 tab daily   No current facility-administered medications on file prior to visit.    Allergies  Allergen Reactions   Losartan Anaphylaxis   Crestor [Rosuvastatin] Other (See Comments)    Elevated LFT's   Lorazepam     Pt is unsure of reaction    Morphine And Codeine Other (See Comments)    Has no effect on pt      Problem list He has Chronic combined systolic and diastolic CHF (congestive heart failure) (HCC); CKD stage 2 due to type 2 diabetes mellitus (HCC); Hyperlipidemia associated with type 2 diabetes mellitus (HCC); BPH with obstruction/lower urinary tract symptoms; Gout; A-fib (HCC); Essential hypertension; Vitamin D deficiency; Anticoagulant long-term use; Renal Transplant, s/p 09/2011; Depression, major, in remission (HCC);  Thrombocytopenia (HCC); CAD (coronary artery disease) of artery bypass graft; IgA nephropathy; Essential tremor; Overweight (BMI 25.0-29.9); Senile purpura (HCC); Diabetes mellitus type 2, controlled (HCC); Former smoker (quit 1992); HNP (herniated nucleus pulposus), cervical; S/P cervical spinal fusion; Chronic hip pain, bilateral; Hypercoagulopathy (HCC); Mobitz I; A-V fistula (HCC); First degree AV block; History of skin cancer; B12 deficiency; and Acquired dilation of ascending aorta and aortic root (HCC) on their problem list.   Observations/Objective:  There were no vitals taken for this visit.  HEENT - WNL. Neck - supple.  Chest - Clear equal BS. Cor - Nl HS. RRR w/o sig MGR. PP 1(+). No edema. MS- FROM w/o deformities.  Gait Nl. Neuro -  Nl w/o focal abnormalities.   Assessment and Plan:  1. Primary narcolepsy without cataplexy  - modafinil (PROVIGIL) 200 MG tablet;    Take  1/2 to 1 tablet  every morning for  Alertness     Dispense: 30 tablet; Refill: 0  2. Essential hypertension  - CBC with Differential/Platelet - COMPLETE METABOLIC PANEL WITH GFR - Magnesium - TSH  3. Fatigue  - CBC with  Differential/Platelet - COMPLETE METABOLIC PANEL WITH GFR - Magnesium - TSH  4. Medication management  - CBC with Differential/Platelet - COMPLETE METABOLIC PANEL WITH GFR - Magnesium - TSH   Follow Up Instructions:        I discussed the assessment and treatment plan with the patient. The patient was provided an opportunity to ask questions and all were answered. The patient agreed with the plan and demonstrated an understanding of the instructions.       The patient was advised to call back or seek an in-person evaluation if the symptoms worsen or if the condition fails to improve as anticipated.    Marinus Maw, MD

## 2023-02-18 ENCOUNTER — Ambulatory Visit (INDEPENDENT_AMBULATORY_CARE_PROVIDER_SITE_OTHER): Payer: Medicare Other | Admitting: Internal Medicine

## 2023-02-18 ENCOUNTER — Encounter: Payer: Self-pay | Admitting: Internal Medicine

## 2023-02-18 VITALS — BP 140/80 | HR 74 | Temp 98.0°F | Resp 16 | Ht 67.0 in | Wt 171.2 lb

## 2023-02-18 DIAGNOSIS — I10 Essential (primary) hypertension: Secondary | ICD-10-CM | POA: Diagnosis not present

## 2023-02-18 DIAGNOSIS — Z79899 Other long term (current) drug therapy: Secondary | ICD-10-CM | POA: Diagnosis not present

## 2023-02-18 DIAGNOSIS — R5383 Other fatigue: Secondary | ICD-10-CM

## 2023-02-18 DIAGNOSIS — G47419 Narcolepsy without cataplexy: Secondary | ICD-10-CM

## 2023-02-18 MED ORDER — MODAFINIL 200 MG PO TABS: ORAL_TABLET | ORAL | 0 refills | Status: DC

## 2023-02-18 NOTE — Patient Instructions (Signed)
Narcolepsy Narcolepsy is a disorder that causes people to fall asleep suddenly and without control (have sleep attacks) during the daytime. It is a lifelong disorder. Narcolepsy disrupts the sleep cycle at night, which then causes daytime sleepiness. What are the causes? The cause of narcolepsy is not fully understood, but it may be related to: Low levels of hypocretin, a chemical (neurotransmitter) in the brain that controls sleep and wake cycles. Hypocretin imbalance may be caused by: Abnormal genes that are passed from parent to child (inherited). An autoimmune disease in which the body's defense system (immune system) attacks the brain cells that make hypocretin. Infection, tumor, or injury in the area of the brain that controls sleep. Exposure to poisons (toxins), such as heavy metals, pesticides, and secondhand smoke. What are the signs or symptoms? Symptoms of this condition include: Excessive daytime sleepiness. This is the most common symptom and is usually the first symptom you will notice. This may affect your performance at work or school. Sleep attacks. You may fall asleep in the middle of an activity, especially low-energy activities like reading or watching TV. Feeling like you cannot think clearly and having trouble focusing or remembering things. You may also feel depressed. Sudden muscle weakness (cataplexy). When this occurs, your speech may become slurred, or your knees may buckle. Cataplexy is usually triggered by surprise, anger, fear, or laughter. Losing the ability to speak or move (sleep paralysis). This may occur just as you start to fall asleep or wake up. You will be aware of the paralysis. It usually lasts for just a few seconds or minutes. Seeing, hearing, tasting, smelling, or feeling things that are not real (hallucinations). Hallucinations may occur with sleep paralysis. They can happen when you are falling asleep, waking up, or dozing. Trouble staying asleep at  night (insomnia) and restless sleep. How is this diagnosed? This condition may be diagnosed based on: A physical exam to rule out any other problems that may be causing your symptoms. You may be asked to write down your sleeping patterns for several weeks in a sleep diary. This will help your health care provider make a diagnosis. Sleep studies that measure how well your REM sleep is regulated. These tests also measure your heart rate, breathing, movement, and brain waves. These tests include: An overnight sleep study (polysomnogram). A daytime sleep study that is done while you take several naps during the day (multiple sleep latency test, MSLT). This test measures how quickly you fall asleep and how quickly you enter REM sleep. Removal of spinal fluid to measure hypocretin levels. How is this treated? There is no cure for this condition, but treatment can help relieve symptoms. Treatment may include: Lifestyle and sleeping strategies to help you cope with the condition, such as: Exercising regularly. Maintaining a regular sleep schedule. Avoiding caffeine and large meals before bed. Medicines. These may include: Medicines that help keep you awake and alert (stimulants) to fight daytime sleepiness. Medicines that treat depression (antidepressants). These may be used to treat cataplexy. Sodium oxybate. This is a strong medicine to help you relax (sedative) that you may take at night. It can help control daytime sleepiness and cataplexy. Other treatments may include mental health counseling or joining a support group. Follow these instructions at home: Sleeping habits  Get about 8 hours of sleep every night. Go to sleep and get up at about the same time every day. Keep your bedroom dark, quiet, and comfortable. When you feel very tired, take short naps. Schedule naps   so that you take them at about the same time every day. Before bedtime: Avoid bright lights and screens. Relax. Try  activities like reading or taking a warm bath. Activity Get at least 20 minutes of exercise every day. This will help you sleep better at night and reduce daytime sleepiness. Avoid exercising within 3 hours of bedtime. Do not drive or use machinery if you are sleepy. If possible, take a nap before driving. Do not swim or go out on the water without a life jacket. Eating and drinking Do not drink alcohol or caffeinated beverages within 4-5 hours of bedtime. Do not eat a large meal before bedtime. Eat meals at about the same times every day. General instructions  Take over-the-counter and prescription medicines only as told by your health care provider. Keep a sleep diary as told by your health care provider. Tell your employer or teachers that you have narcolepsy. You may be able to adjust your schedule to include time for naps. Do not use any products that contain nicotine or tobacco. These products include cigarettes, chewing tobacco, and vaping devices, such as e-cigarettes. If you need help quitting, ask your health care provider. Where to find more information [employer] of Neurological Disorders and Stroke: [email or web address] Contact a health care provider if: Your symptoms are not getting better. You have fast or irregular heartbeats (palpitations). You are having a hard time determining what is real and what is not (psychosis). Get help right away if: You hurt yourself during a sleep attack or an attack of cataplexy. You have chest pain. You have trouble breathing. These symptoms may be an emergency. Get help right away. Call 911. Do not wait to see if the symptoms will go away. Do not drive yourself to the hospital. Summary Narcolepsy is a disorder that causes people to fall asleep suddenly and without control during the daytime (sleep attacks). Narcolepsy is a lifelong disorder with no cure. Treatment can help relieve symptoms. Go to sleep and get up at about the same time  every day. Follow instructions about sleep and activities as told by your health care provider. Take over-the-counter and prescription medicines only as told by your health care provider. This information is not intended to replace advice given to you by your health care provider. Make sure you discuss any questions you have with your health care provider. Document Revised: 07/27/2021 Document Reviewed: 07/27/2021 Elsevier Patient Education  2024 [employer].

## 2023-02-19 LAB — CBC WITH DIFFERENTIAL/PLATELET
Absolute Lymphocytes: 882 {cells}/uL (ref 850–3900)
Absolute Monocytes: 390 {cells}/uL (ref 200–950)
Basophils Absolute: 42 {cells}/uL (ref 0–200)
Basophils Relative: 0.7 %
Eosinophils Absolute: 102 {cells}/uL (ref 15–500)
Eosinophils Relative: 1.7 %
HCT: 39 % (ref 38.5–50.0)
Hemoglobin: 12.7 g/dL — ABNORMAL LOW (ref 13.2–17.1)
MCH: 31.2 pg (ref 27.0–33.0)
MCHC: 32.6 g/dL (ref 32.0–36.0)
MCV: 95.8 fL (ref 80.0–100.0)
MPV: 9.5 fL (ref 7.5–12.5)
Monocytes Relative: 6.5 %
Neutro Abs: 4584 {cells}/uL (ref 1500–7800)
Neutrophils Relative %: 76.4 %
Platelets: 155 10*3/uL (ref 140–400)
RBC: 4.07 10*6/uL — ABNORMAL LOW (ref 4.20–5.80)
RDW: 12.2 % (ref 11.0–15.0)
Total Lymphocyte: 14.7 %
WBC: 6 10*3/uL (ref 3.8–10.8)

## 2023-02-19 LAB — COMPLETE METABOLIC PANEL WITH GFR
AG Ratio: 1.6 (calc) (ref 1.0–2.5)
ALT: 11 U/L (ref 9–46)
AST: 18 U/L (ref 10–35)
Albumin: 4.2 g/dL (ref 3.6–5.1)
Alkaline phosphatase (APISO): 130 U/L (ref 35–144)
BUN/Creatinine Ratio: 24 (calc) — ABNORMAL HIGH (ref 6–22)
BUN: 28 mg/dL — ABNORMAL HIGH (ref 7–25)
CO2: 28 mmol/L (ref 20–32)
Calcium: 10.1 mg/dL (ref 8.6–10.3)
Chloride: 104 mmol/L (ref 98–110)
Creat: 1.18 mg/dL (ref 0.70–1.28)
Globulin: 2.6 g/dL (ref 1.9–3.7)
Glucose, Bld: 103 mg/dL — ABNORMAL HIGH (ref 65–99)
Potassium: 5.1 mmol/L (ref 3.5–5.3)
Sodium: 138 mmol/L (ref 135–146)
Total Bilirubin: 1.6 mg/dL — ABNORMAL HIGH (ref 0.2–1.2)
Total Protein: 6.8 g/dL (ref 6.1–8.1)
eGFR: 65 mL/min/{1.73_m2} (ref >=60)

## 2023-02-19 LAB — TSH: TSH: 1.82 m[IU]/L (ref 0.40–4.50)

## 2023-02-19 LAB — MAGNESIUM: Magnesium: 2.2 mg/dL (ref 1.5–2.5)

## 2023-02-19 NOTE — Progress Notes (Signed)
<>*<>*<>*<>*<>*<>*<>*<>*<>*<>*<>*<>*<>*<>*<>*<>*<>*<>*<>*<>*<>*<>*<>*<>*<> <>*<>*<>*<>*<>*<>*<>*<>*<>*<>*<>*<>*<>*<>*<>*<>*<>*<>*<>*<>*<>*<>*<>*<>*<>  -  Test results slightly outside the reference range are not unusual. If there is anything important, I will review this with you,  otherwise it is considered normal test values.  If you have further questions,  please do not hesitate to contact me at the office or via My Chart.   <>*<>*<>*<>*<>*<>*<>*<>*<>*<>*<>*<>*<>*<>*<>*<>*<>*<>*<>*<>*<>*<>*<>*<>*<> <>*<>*<>*<>*<>*<>*<>*<>*<>*<>*<>*<>*<>*<>*<>*<>*<>*<>*<>*<>*<>*<>*<>*<>*<>  -  Kidney functions Improved from Stage 3a  to now back in Stage 2  - Great!   <>*<>*<>*<>*<>*<>*<>*<>*<>*<>*<>*<>*<>*<>*<>*<>*<>*<>*<>*<>*<>*<>*<>*<>*<> <>*<>*<>*<>*<>*<>*<>*<>*<>*<>*<>*<>*<>*<>*<>*<>*<>*<>*<>*<>*<>*<>*<>*<>*<>  -  All Else - CBC -  Electrolytes - Liver - Magnesium & Thyroid                                                                                                         - all  Normal / OK  <>*<>*<>*<>*<>*<>*<>*<>*<>*<>*<>*<>*<>*<>*<>*<>*<>*<>*<>*<>*<>*<>*<>*<>*<> <>*<>*<>*<>*<>*<>*<>*<>*<>*<>*<>*<>*<>*<>*<>*<>*<>*<>*<>*<>*<>*<>*<>*<>*<>

## 2023-02-22 ENCOUNTER — Encounter: Payer: Self-pay | Admitting: Internal Medicine

## 2023-02-23 ENCOUNTER — Encounter: Payer: Self-pay | Admitting: Internal Medicine

## 2023-02-25 ENCOUNTER — Encounter: Payer: Self-pay | Admitting: Internal Medicine

## 2023-02-25 ENCOUNTER — Ambulatory Visit (INDEPENDENT_AMBULATORY_CARE_PROVIDER_SITE_OTHER): Payer: Medicare Other | Admitting: Internal Medicine

## 2023-02-25 VITALS — BP 120/80 | HR 58 | Temp 97.9°F | Resp 16 | Ht 67.0 in | Wt 166.6 lb

## 2023-02-25 DIAGNOSIS — K219 Gastro-esophageal reflux disease without esophagitis: Secondary | ICD-10-CM

## 2023-02-25 DIAGNOSIS — R1013 Epigastric pain: Secondary | ICD-10-CM | POA: Diagnosis not present

## 2023-02-25 DIAGNOSIS — R634 Abnormal weight loss: Secondary | ICD-10-CM | POA: Diagnosis not present

## 2023-02-25 DIAGNOSIS — Z79899 Other long term (current) drug therapy: Secondary | ICD-10-CM

## 2023-02-25 MED ORDER — PANTOPRAZOLE SODIUM 40 MG PO TBEC: DELAYED_RELEASE_TABLET | ORAL | 0 refills | Status: DC

## 2023-02-25 NOTE — Progress Notes (Addendum)
[facility]  [name], M.D.        Rance Muir, D.NP      Adela Glimpse, D.NP  [facility] [address]  [place], [place]. [address] Telephone [phone] Telefax [phone]    Future Appointments  Date Time Provider Department  02/25/2023  3:30 PM [name], MD [facility]  03/10/2023  2:00 PM Haze Rushing, PhD [facility]  03/12/2023 10:30 AM [name], MD [facility]  03/14/2023  9:30 AM Raynelle Dick, NP [facility]  04/23/2023 10:30 AM [name], MD [facility]  07/16/2023                         cpe       9:00 AM Adela Glimpse, NP [facility]  12/02/2023                        wellness 11:00 AM Raynelle Dick, NP [facility]    History of Present Illness:     The patient very nice nice 77 y.o. 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 He has been under excess stress related to his wife's Bipolar Psychiatric illness.      Patient presets today with 2-3 week prodrome of increasing bilat mid to upper abdominal pain intermittently which he rates at intensity or severity of 8/10 pain. Did have associated N/emesis last night, altho typically not affected by food intake.  Abd CT scan in Dec 2022 did  show multiple tiny gall stones. Patient also is noted to have a 10 # weight loss over the last 3 weeks .  Wt Readings from Last 3 Encounters:  02/25/23 166 lb 9.6 oz (75.6 kg)  02/18/23 171 lb 3.2 oz (77.7 kg)  02/06/23 176 lb (79.8 kg)     Current Outpatient Medications on File Prior to Visit  Medication Sig   allopurinol  300 MG tablet TAKE 1 TABLET  DAILY    VITAMIN C 1000 MG tablet Take 1,000 mg  daily.   atorvastatin  40 MG tablet Take 1 tab daily for cholesterol.   ALPHAGAN 0.2 % ophth soln In Eye(s)    buPROPion  XL 300 MG  Take  1 tablet  Daily    finasteride 5 MG tablet Take  1 tablet  Daily  for Prostate   gabapentin  300 MG capsule Take  1 capsule  3 x /day    NOVOLIN 70/30 Inject 12-22 u - AM   &    12-15 u PM.   XALATAN 0.005 % ophth soln Place 1 drop into both eyes at bedtime.    Magnesium 500 MG TABS Take 1 tablet  daily.   modafinil (PROVIGIL) 200 MG tablet Take  1/2 to 1 tablet  every morning for  Alertness   mycophenolate  360 MG  TAKE 1 TABLET  TWICE DAILY   spironolactone  25 MG tablet TAKE ONE-HALF TABLET DAILY   tacrolimus (PROGRAF) 0.5 MG capsule Take 0.5 mg  2  times daily. With 1mg  capsule for total of 1.5mg  twice daily   tacrolimus (PROGRAF) 1 MG capsule Take 1 mg 2 (two) times daily. With  0.5g capsule for total of 1.5mg  twice daily   tamsulosin 0.4  MG CAPS capsule Take daily.   traZODone 50 MG tablet TAKE ONE TABLET   BEFORE BEDTIME   valsartan  80 MG tablet Take 1 tablet  daily.   warfarin  5 MG tablet Take  1 tab daily     Allergies  Allergen Reactions   Losartan Anaphylaxis   Crestor [Rosuvastatin] Other (See Comments)    Elevated LFT's   Lorazepam     Pt is unsure of reaction    Morphine And Codeine Other (See Comments)    Has no effect on pt      Problem list He has Chronic combined systolic and diastolic CHF (congestive heart failure) (HCC); CKD stage 2 due to type 2 diabetes mellitus (HCC); Hyperlipidemia associated with type 2 diabetes mellitus (HCC); BPH with obstruction/lower urinary tract symptoms; Gout; A-fib (HCC); Essential hypertension; Vitamin D deficiency; Anticoagulant long-term use; Renal Transplant, s/p 09/2011; Depression, major, in remission (HCC); Thrombocytopenia (HCC); CAD (coronary artery disease) of artery bypass graft; IgA nephropathy; Essential tremor; Overweight (BMI 25.0-29.9); Senile purpura (HCC); Diabetes mellitus type 2, controlled (HCC); Former smoker (quit 1992); HNP (herniated nucleus pulposus), cervical; S/P cervical spinal  fusion; Chronic hip pain, bilateral; Hypercoagulopathy (HCC); Mobitz I; A-V fistula (HCC); First degree AV block; History of skin cancer; B12 deficiency; and Acquired dilation of ascending aorta and aortic root (HCC) on their problem list.   Observations/Objective:  BP 120/80   Pulse (!) 58   Temp 97.9 F (36.6 C)   Resp 16   Ht 5\' 7"  (1.702 m)   Wt 166 lb 9.6 oz (75.6 kg)   SpO2 99%   BMI 26.09 kg/m   HEENT - WNL. Neck - supple.  Chest - Clear equal BS. Cor - Nl HS. RRR w/o sig MGR. PP 1(+). No edema. Abd- Soft w/o masses or organomegaly, but there is tenderness in th RUQ/EG area. No guarding or rebound & BS Nl.  MS- FROM w/o deformities.  Gait Nl. Neuro -  Nl w/o focal abnormalities.  Assessment and Plan:   1. Epigastric pain  - CBC with Differential/Platelet - COMPLETE METABOLIC PANEL WITH GFR - Lipase - Amylase - Sedimentation rate - US Abdomen Complete  2. Weight loss, unintentional  - TSH - Sedimentation rate - US Abdomen Complete  3. Medication management  - CBC with Differential/Platelet - COMPLETE METABOLIC PANEL WITH GFR - TSH - Lipase - Amylase - Sedimentation rate   Follow Up Instructions:        I discussed the assessment and treatment plan with the patient. The patient was provided an opportunity to ask questions and all were answered. The patient agreed with the plan and demonstrated an understanding of the instructions.       The patient was advised to call back or seek an in-person evaluation if the symptoms worsen or if the condition fails to improve as anticipated.    Marinus Maw, MD

## 2023-02-26 ENCOUNTER — Other Ambulatory Visit: Payer: Self-pay | Admitting: Internal Medicine

## 2023-02-26 ENCOUNTER — Ambulatory Visit (HOSPITAL_COMMUNITY)
Admission: RE | Admit: 2023-02-26 | Discharge: 2023-02-26 | Disposition: A | Discharge status: Home / Self Care | Payer: Medicare Other | Source: Ambulatory Visit | Attending: Internal Medicine | Admitting: Internal Medicine

## 2023-02-26 DIAGNOSIS — R1011 Right upper quadrant pain: Secondary | ICD-10-CM

## 2023-02-26 DIAGNOSIS — R1013 Epigastric pain: Secondary | ICD-10-CM | POA: Diagnosis present

## 2023-02-26 DIAGNOSIS — R634 Abnormal weight loss: Secondary | ICD-10-CM | POA: Insufficient documentation

## 2023-02-26 DIAGNOSIS — K8070 Calculus of gallbladder and bile duct without cholecystitis without obstruction: Secondary | ICD-10-CM

## 2023-02-26 LAB — CBC WITH DIFFERENTIAL/PLATELET
Absolute Lymphocytes: 1135 {cells}/uL (ref 850–3900)
Absolute Monocytes: 477 {cells}/uL (ref 200–950)
Basophils Absolute: 62 {cells}/uL (ref 0–200)
Basophils Relative: 1 %
Eosinophils Absolute: 87 {cells}/uL (ref 15–500)
Eosinophils Relative: 1.4 %
HCT: 40.3 % (ref 38.5–50.0)
Hemoglobin: 13 g/dL — ABNORMAL LOW (ref 13.2–17.1)
MCH: 31.1 pg (ref 27.0–33.0)
MCHC: 32.3 g/dL (ref 32.0–36.0)
MCV: 96.4 fL (ref 80.0–100.0)
MPV: 9.7 fL (ref 7.5–12.5)
Monocytes Relative: 7.7 %
Neutro Abs: 4439 {cells}/uL (ref 1500–7800)
Neutrophils Relative %: 71.6 %
Platelets: 168 10*3/uL (ref 140–400)
RBC: 4.18 10*6/uL — ABNORMAL LOW (ref 4.20–5.80)
RDW: 12.4 % (ref 11.0–15.0)
Total Lymphocyte: 18.3 %
WBC: 6.2 10*3/uL (ref 3.8–10.8)

## 2023-02-26 LAB — COMPLETE METABOLIC PANEL WITH GFR
AG Ratio: 1.5 (calc) (ref 1.0–2.5)
ALT: 10 U/L (ref 9–46)
AST: 17 U/L (ref 10–35)
Albumin: 4.1 g/dL (ref 3.6–5.1)
Alkaline phosphatase (APISO): 115 U/L (ref 35–144)
BUN/Creatinine Ratio: 25 (calc) — ABNORMAL HIGH (ref 6–22)
BUN: 32 mg/dL — ABNORMAL HIGH (ref 7–25)
CO2: 30 mmol/L (ref 20–32)
Calcium: 10 mg/dL (ref 8.6–10.3)
Chloride: 102 mmol/L (ref 98–110)
Creat: 1.28 mg/dL (ref 0.70–1.28)
Globulin: 2.7 g/dL (ref 1.9–3.7)
Glucose, Bld: 117 mg/dL — ABNORMAL HIGH (ref 65–99)
Potassium: 5.1 mmol/L (ref 3.5–5.3)
Sodium: 137 mmol/L (ref 135–146)
Total Bilirubin: 1.5 mg/dL — ABNORMAL HIGH (ref 0.2–1.2)
Total Protein: 6.8 g/dL (ref 6.1–8.1)
eGFR: 59 mL/min/{1.73_m2} — ABNORMAL LOW (ref >=60)

## 2023-02-26 LAB — LIPASE: Lipase: 12 U/L (ref 7–60)

## 2023-02-26 LAB — SEDIMENTATION RATE: Sed Rate: 9 mm/h (ref 0–20)

## 2023-02-26 LAB — AMYLASE: Amylase: 57 U/L (ref 21–101)

## 2023-02-26 LAB — TSH: TSH: 1.48 m[IU]/L (ref 0.40–4.50)

## 2023-02-26 NOTE — Progress Notes (Signed)
<>*<>*<>*<>*<>*<>*<>*<>*<>*<>*<>*<>*<>*<>*<>*<>*<>*<>*<>*<>*<>*<>*<>*<>*<> <>*<>  -   Abdominal Ultrasound shows gallstone in gallbladder without obstruction,                                   &   No obvious Gallbladder inflammation   - So the next step is to order another test to check                                                 gallbladder for function called a " HIDA test "  - If that's Negative / Normal,               then next step would be a GI Referral for Upper (stomach) endoscopy                       ( For that we would contact Dr Adela Lank )  <>*<>*<>*<>*<>*<>*<>*<>*<>*<>*<>*<>*<>*<>*<>*<>*<>*<>*<>*<>*<>*<>*<>*<>*<> <>*<>*<>*<>*<>*<>*<>*<>*<>*<>*<>*<>*<>*<>*<>*<>*<>*<>*<>*<>*<>*<>*<>*<>*<>

## 2023-02-26 NOTE — Progress Notes (Signed)
<>*<>*<>*<>*<>*<>*<>*<>*<>*<>*<>*<>*<>*<>*<>*<>*<>*<>*<>*<>*<>*<>*<>*<>*<> <>*<>*<>*<>*<>*<>*<>*<>*<>*<>*<>*<>*<>*<>*<>*<>*<>*<>*<>*<>*<>*<>*<>*<>*<>  -  Test results slightly outside the reference range are not unusual. If there is anything important, I will review this with you,  otherwise it is considered normal test values.  If you have further questions,  please do not hesitate to contact me at the office or via My Chart.   <>*<>*<>*<>*<>*<>*<>*<>*<>*<>*<>*<>*<>*<>*<>*<>*<>*<>*<>*<>*<>*<>*<>*<>*<> <>*<>*<>*<>*<>*<>*<>*<>*<>*<>*<>*<>*<>*<>*<>*<>*<>*<>*<>*<>*<>*<>*<>*<>*<>  - CBC - Normal Red cell count (no anemia)  & Normal WBC   - Kidney functions stable   - Blood chemistries show                                 Normal electrolytes,                                                     Normal  liver enzymes,                                                                   Normal Pancreas enzymes  - Thyroid test is Normal   - Sed Rate test for Inflammation is Normal    - Abdominal Ultrasound shows gallstone in gallbladder without obstruction,                                   &   No obvious Gallbladder inflammation   - So the next step is to order another test to check                                                  gallbladder for function called a " HIDA test "   - If that's Negative / Normal,                then next step would be a GI Referral for Upper (stomach) endoscopy                        ( For that we would contact Dr Adela Lank )

## 2023-02-27 NOTE — Progress Notes (Signed)
Notified patient of result of Ultrasound.  Gave patient schedule for HIDA scan with special instructions. Patient understands results and confirmed NM HIDA scan appointment.

## 2023-03-04 ENCOUNTER — Other Ambulatory Visit: Payer: Self-pay | Admitting: Internal Medicine

## 2023-03-04 ENCOUNTER — Encounter: Payer: Self-pay | Admitting: Internal Medicine

## 2023-03-04 ENCOUNTER — Telehealth: Payer: Self-pay | Admitting: Internal Medicine

## 2023-03-04 ENCOUNTER — Encounter (HOSPITAL_COMMUNITY)
Admission: RE | Admit: 2023-03-04 | Discharge: 2023-03-04 | Disposition: A | Discharge status: Home / Self Care | Payer: Medicare Other | Source: Ambulatory Visit | Attending: Internal Medicine | Admitting: Internal Medicine

## 2023-03-04 DIAGNOSIS — R1011 Right upper quadrant pain: Secondary | ICD-10-CM | POA: Diagnosis present

## 2023-03-04 DIAGNOSIS — K8070 Calculus of gallbladder and bile duct without cholecystitis without obstruction: Secondary | ICD-10-CM | POA: Insufficient documentation

## 2023-03-04 DIAGNOSIS — R1013 Epigastric pain: Secondary | ICD-10-CM

## 2023-03-04 MED ORDER — TECHNETIUM TC 99M MEBROFENIN IV KIT
5.3000 | PACK | Freq: Once | INTRAVENOUS | Status: AC
  Administered 2023-03-04: 5.3 via INTRAVENOUS

## 2023-03-04 NOTE — Telephone Encounter (Signed)
For documentation purposes: Patient called to cancel an appointment. Once the appointment was cancelled, he asked if I could fax over his lab results to a different office. Due to policy and law, I explained to him he needs the office to fax over a request for the lab results. He said "well I never had to do that before". Instead of arguing, I asked if he had the office's fax number he wants the lab results sent to. He responded "no". Then I asked him to call and get the fax number. He started to complain, "why do I have to..." in a very agitated, loud tone. In other words, he was raising his voice. I said "I don't need the attitude." He responded "I don't need yours either." Then I hung the phone up. He called back about 5 minutes later asking to speak with someone else. Lynnea Ferrier spoke with him and explained the situation.

## 2023-03-04 NOTE — Progress Notes (Signed)
[][][][][][][][][][][][][][][][][][][][][][][][][][][][][][][][][][][][][][][][][]][][][][][][][][][][][][][][][][][][][][][][][[][][][][] [][][][][][][][][][][][][][][][][][][][][][][][][][][][][][][][][][][][][][][][][]][][][][][][][][][][][][][][][][][][][][][][][[][][][][] -  Test results slightly outside the reference range are not unusual. If there is anything important, I will review this with you,  otherwise it is considered normal test values.  If you have further questions,  please do not hesitate to contact me at the office or via My Chart.  [] [] [] [] [] [] [] [] [] [] [] [] [] [] [] [] [] [] [] [] [] [] [] [] [] [] [] [] [] [] [] [] [] [] [] [] [] [] [] [] [] ][] [] [] [] [] [] [] [] [] [] [] [] [] [] [] [] [] [] [] [] [] [] [[] [] [] [] []  [] [] [] [] [] [] [] [] [] [] [] [] [] [] [] [] [] [] [] [] [] [] [] [] [] [] [] [] [] [] [] [] [] [] [] [] [] [] [] [] [] ][] [] [] [] [] [] [] [] [] [] [] [] [] [] [] [] [] [] [] [] [] [] [[] [] [] [] []   -  Gallbladder HIDA scan is Negative  or Normal , So . . . .   I have requested GI consultation with your Gastroenterologist - Dr Adela Lank  [] [] [] [] [] [] [] [] [] [] [] [] [] [] [] [] [] [] [] [] [] [] [] [] [] [] [] [] [] [] [] [] [] [] [] [] [] [] [] [] [] ][] [] [] [] [] [] [] [] [] [] [] [] [] [] [] [] [] [] [] [] [] [] [[] [] [] [] []

## 2023-03-05 ENCOUNTER — Telehealth: Payer: Self-pay | Admitting: *Deleted

## 2023-03-05 NOTE — Telephone Encounter (Signed)
Patient has been scheduled to see Hyacinth Meeker, PA-C on 03/31/23 at 9 am. Patient is in agreement with this plan.

## 2023-03-05 NOTE — Telephone Encounter (Signed)
-----   Message from Benancio Deeds sent at 03/05/2023  7:41 AM EST ----- Karen Kitchens I received this note from Dr. Oneta Rack.  I am assuming he would like our assistance for further evaluation.  Can you please book him in the office for next available clinic visit with me or APP?  Thanks ----- Message ----- From: [name], MD Sent: 03/04/2023   9:23 PM EST To: Benancio Deeds, MD

## 2023-03-07 ENCOUNTER — Ambulatory Visit: Payer: Medicare Other | Admitting: Psychology

## 2023-03-10 ENCOUNTER — Ambulatory Visit (INDEPENDENT_AMBULATORY_CARE_PROVIDER_SITE_OTHER): Payer: Medicare Other | Admitting: Psychology

## 2023-03-10 DIAGNOSIS — F4321 Adjustment disorder with depressed mood: Secondary | ICD-10-CM | POA: Diagnosis not present

## 2023-03-10 NOTE — Progress Notes (Signed)
[facility] Counselor Initial Adult Exam  Name: Robert Arnold. Date: 03/10/2023 MRN: [id number] DOB: Dec 14, 1945 PCP: [name], MD    [name]. participated from home, via video, and consented to treatment. Therapist participated from home office. We met online due to COVID pandemic.   Guardian/Payee:  N/A    Paperwork requested: No   Reason for Visit /Presenting Problem: emotional adjustment associated with recent marital separation  Mental Status Exam: Appearance:   Casual     Behavior:  Appropriate  Motor:  Normal  Speech/Language:   Normal Rate  Affect:  Appropriate  Mood:  depressed  Thought process:  normal  Thought content:    WNL  Sensory/Perceptual disturbances:    WNL  Orientation:  oriented to person, place, and situation  Attention:  Good  Concentration:  Good  Memory:  WNL  Fund of knowledge:   Good  Insight:    unknown  Judgment:   unknown  Impulse Control:  Good     Reported Symptoms:  sad, depressed, anxious  Risk Assessment: Danger to Self:  No Self-injurious Behavior: No Danger to Others: No Duty to Warn:no Physical Aggression / Violence:No  Access to Firearms a concern:  unknown Gang Involvement:No  Patient / guardian was educated about steps to take if suicide or homicide risk level increases between visits: n/a While future psychiatric events cannot be accurately predicted, the patient does not currently require acute inpatient psychiatric care and does not currently meet [facility] involuntary commitment criteria.  Substance Abuse History: Current substance abuse: No     Past Psychiatric History:   No previous psychological problems have been observed Outpatient Providers:N/A History of Psych Hospitalization: No  Psychological Testing:  N/A    Abuse History:  Victim of:  No.,  N/A    Report needed: No. Victim of Neglect:No. Perpetrator of  N/A   Witness / Exposure to Domestic Violence: No   Protective Services Involvement: No  Witness to [employer] Violence:  No   Family History:  Family History  Problem Relation Age of Onset   Diabetes Mother    COPD Mother    Heart attack Father        Unsure    Kidney disease Sister    Alcoholism Brother    Alzheimer's disease Brother    Alcohol abuse Brother    COPD Brother    Alcohol abuse Maternal Grandfather    Colon cancer Neg Hx    Stomach cancer Neg Hx    Rectal cancer Neg Hx    Esophageal cancer Neg Hx    Liver cancer Neg Hx    Pancreatic cancer Neg Hx     Living situation: the patient lives with their spouse  Sexual Orientation: Straight  Relationship Status: separated  Name of spouse / other:[name] If a parent, number of children / ages:adult step-son  Support Systems: minimal support  Financial Stress:  No   Income/Employment/Disability: Neurosurgeon:  unknown  Educational History: Education:  unknown  Religion/Sprituality/World View: unknown  Any cultural differences that may affect / interfere with treatment:  not applicable  Recreation/Hobbies: exercise  Stressors: Marital or family conflict    Strengths: unknown  Barriers:  social isolation   Legal History: Pending legal issue / charges: The patient has no significant history of legal issues. History of legal issue / charges:  N/A  Medical History/Surgical History: not reviewed Past Medical History:  Diagnosis Date   Adenomatous colon polyp    Allergy    BPH (benign prostatic hyperplasia)    CAD (coronary artery disease)    a. NSTEMI 09/2011 s/p PCI to LAD. b. CABG 01/2012 @ [facility].   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   Essential hypertension    FHx: heart disease 02/27/2018    Glaucoma    Gout    Histoplasmosis    on itraconazole for prophylaxis   Ischemic cardiomyopathy    Mild aortic stenosis    Mild dilation of ascending aorta (HCC)    NSTEMI (non-ST elevated myocardial infarction) (HCC) 11/26/2013   2013    OSA (obstructive sleep apnea)    PAF (paroxysmal atrial fibrillation) (HCC)    Testosterone deficiency 02/27/2018   Trifascicular block     Past Surgical History:  Procedure Laterality Date   ANTERIOR CERVICAL DECOMP/DISCECTOMY FUSION N/A 05/04/2018   Procedure: Cervical Five-Six Anterior cervical decompression/discectomy/fusion;  Surgeon: Shirlean Kelly, MD;  Location: [facility] OR;  Service: Neurosurgery;  Laterality: N/A;  Cervical Five-Six Anterior cervical decompression/discectomy/fusion   AV FISTULA PLACEMENT  2011   Left forearm   COLONOSCOPY     CORONARY ANGIOPLASTY WITH STENT PLACEMENT     CORONARY ARTERY BYPASS GRAFT  2013   KIDNEY TRANSPLANT  09/25/2011   LAPAROSCOPIC APPENDECTOMY N/A 04/06/2021   Procedure: APPENDECTOMY LAPAROSCOPIC;  Surgeon: Emelia Loron, MD;  Location: [facility] OR;  Service: General;  Laterality: N/A;   PERITONEAL CATHETER INSERTION  2011    Medications: Current Outpatient Medications  Medication Sig Dispense Refill   allopurinol (ZYLOPRIM) 300 MG tablet TAKE 1 TABLET BY MOUTH DAILY TO PREVENT GOUT 90 tablet 3   Ascorbic Acid (VITAMIN C) 1000 MG tablet Take 1,000 mg by mouth daily.     atorvastatin (LIPITOR) 40 MG tablet Take 1 tab daily for cholesterol. 90 tablet 3   brimonidine (ALPHAGAN) 0.2 % ophthalmic solution SMARTSIG:In Eye(s)     buPROPion (WELLBUTRIN XL) 300 MG 24 hr tablet Take  1 tablet  Daily for Mood, Focus & Concentration                                         /         TAKE                      BY                      MOUTH 90 tablet 3   finasteride (PROSCAR) 5 MG tablet Take  1 tablet  Daily  for Prostate 90 tablet 3   gabapentin (NEURONTIN) 300 MG capsule Take  1 capsule  3 x /day for  Pain                                                                                               /  TAKE                                         BY                                                 MOUTH 270 capsule 3   glucose blood (FREESTYLE TEST STRIPS) test strip Check blood sugar 3 to 4 times daily for medication regulation. 450 each PRN   insulin NPH-regular Human (NOVOLIN 70/30) (70-30) 100 UNIT/ML injection Inject 12-22 Units into the skin in the morning and at bedtime. 22 units in the AM, 12-15 units in the PM. 10 mL 99   latanoprost (XALATAN) 0.005 % ophthalmic solution Place 1 drop into both eyes at bedtime.   4   Magnesium 500 MG TABS Take 1 tablet (500 mg total) by mouth daily. 30 tablet    modafinil (PROVIGIL) 200 MG tablet Take  1/2 to 1 tablet  every morning for  Alertness 30 tablet 0   mycophenolate (MYFORTIC) 360 MG TBEC EC tablet TAKE 1 TABLET BY MOUTH TWICE DAILY 180 tablet 1   pantoprazole (PROTONIX) 40 MG tablet Take  1 tablet  2 x / day   to Prevent Heartburn &  Indigestion                                 /                                                                   TAKE                                         BY                                                 MOUTH 180 tablet 0   spironolactone (ALDACTONE) 25 MG tablet TAKE ONE-HALF (0.5) TABLET BY MOUTH DAILY 45 tablet 3   tacrolimus (PROGRAF) 0.5 MG capsule Take 0.5 mg by mouth 2 (two) times daily. With 1mg  capsule for total of 1.5mg  twice daily     tacrolimus (PROGRAF) 1 MG capsule Take 1 mg by mouth 2 (two) times daily. With  0.5g capsule for total of 1.5mg  twice daily     tamsulosin (FLOMAX) 0.4 MG CAPS capsule Take 0.4 mg by mouth daily.     traZODone (DESYREL) 50 MG tablet TAKE ONE TABLET BY MOUTH DAILY ONE HOUR BEFORE BEDTIME AS NEEDED FOR SLEEP 90 tablet 3   valsartan (DIOVAN) 80 MG tablet Take 1 tablet (80 mg total) by mouth daily. 90 tablet 3   warfarin  (COUMADIN) 5 MG tablet Take  1 tab daily     No current facility-administered medications  for this visit.    Allergies  Allergen Reactions   Losartan Anaphylaxis   Crestor [Rosuvastatin] Other (See Comments)    Elevated LFT's   Lorazepam     Pt is unsure of reaction    Morphine And Codeine Other (See Comments)    Has no effect on pt   Initial Note: Patient says his wife of 48 years, Dewayne Hatch, has always had mental health issues. In Feb., "the wheels came off" and she committed herself to psychiatric hospital in April. She was in 10-12 days and a week later she tried to commit suicide (March 31st) with pills. He called 911 and she was back in hospital at [facility] and then transferred to [facility]. She was in hospital in [place] because they said they did not have beds. He says he visited her every night. After several days, she told him not to come because their son was visiting. After discharge, she told him that she wasn't coming home and she went to live with their son. His (step)son did not initially give him any information. Their son is now giving him information. His sister in law will not return his calls. He says that his wife accuses him of things that have never happened. She claims that he has been mentally abusing her. Wife is seeing a psychologist in Dr. Alwyn Ren office. Son Leonette Most is now talking with him and tells him he knows that his mother is fabricating things. He says he loves hs wife and wants her back. She tells him she wants to come back at some point and they are seeing each other regularly (3-4 days/week). He said before recent situation, marriage had "normal ups and downs". Never separated in the past. Her mental health issues were primarily depression. Wife's second marriage and his first.  He says he was "terrible" at the beginning of her decline. He has been fighting depression and has to keep busy. Often watches TV, house projects and [facility] [employer]. Is involved at the church.  He has no social network and is lonely. Wife is making friends. When he is with his wife, he feels uptight out of fear of pushing her away more. Walking on eggshells because she is so sensitive to what he says.  FOO: half sister, full sister and a brother. Very close with half sister, but brother "disowned" him (as of 6-8 weeks ago) and sister won't talk with him. She disowned him for 7 years. They reconnected and then she cut him off again. Ashby Dawes of relationship problems with sibs is not clear.  Used to play golf and bowl, but he has a bad knee that keeps him from both.  He had kidney transplant 11 years ago. Close with grandson and his 2 great grandchildren (boy and girl). No alcohol or other substances. His father, and 4 brothers were all alcoholic.  Fa. passed 30 years ago and mother 10 years ago. Never treated for any mental health issues. He is seeking to manage feelings associated with marital separation. Wife has been at [facility]' house X 3 months.   Tx. Plan/goals: Patient is seeking to reduce depressive symptoms of sadness, helplessness and despair associated the the recent unwanted marital separation. In addition, he expresses the need to better understand how to foster a better relationship with spouse to facilitate their anticipated reconciliation. Treatment will utilize systems oriented counseling and insight oriented therapy. Goal date is 3-25.  Patient was seen in a video session and he is aware of the  platform limitations. He is at home and provider in his office.   Session Note: Robert Arnold says that today is 2 weeks that his wife has been home with him. She will bring up past issues that he doesn't recall or disputes and he simply tells her "we are not going there". He says they had a nice Thanksgiving. Wife continues to see a therapist. Robert Arnold fels that his situation, and therefore his moods, have stabilized. He is very optimistic that he will be able to successfully reconcile with wife. We  talked about potential triggers and signs that problems in the relationship are re-emerging. At this time, he states he would like to suspend our meetings. Says he will contact me if needed in the future.                        Diagnoses:  Adjustment Disorder with depressed mood  Plan of Care: outpatient psychotherapy (individual) and referral to conjoint if needed   Garrel Ridgel, PhD  2:10p-3:00p 50 minutes

## 2023-03-12 ENCOUNTER — Encounter: Payer: Self-pay | Admitting: Internal Medicine

## 2023-03-12 ENCOUNTER — Ambulatory Visit (INDEPENDENT_AMBULATORY_CARE_PROVIDER_SITE_OTHER): Payer: Medicare Other | Admitting: Internal Medicine

## 2023-03-12 VITALS — BP 112/62 | HR 67 | Temp 97.5°F | Resp 16 | Ht 67.0 in | Wt 169.4 lb

## 2023-03-12 DIAGNOSIS — R1010 Upper abdominal pain, unspecified: Secondary | ICD-10-CM | POA: Diagnosis not present

## 2023-03-12 DIAGNOSIS — G47419 Narcolepsy without cataplexy: Secondary | ICD-10-CM

## 2023-03-12 MED ORDER — MODAFINIL 200 MG PO TABS: ORAL_TABLET | ORAL | 0 refills | Status: DC

## 2023-03-12 NOTE — Progress Notes (Signed)
[place]      ADULT   &   ADOLESCENT      INTERNAL MEDICINE  [name], M.D.          Rance Muir, ANP        Adela Glimpse, FNP  [facility] [address]  [place], [place]. [address] Telephone [phone] Telefax [phone]      Future Appointments  Date Time Provider Department  03/12/2023 10:30 AM [name], MD [facility]  03/31/2023  9:00 AM Unk Lightning, PA [facility]  04/23/2023 10:30 AM [name], MD [facility]  07/16/2023  9:00 AM Adela Glimpse, NP [facility]  12/02/2023 11:00 AM Raynelle Dick, NP [facility]    History of Present Illness:                                          The patient very nice nice 77 y.o. 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       Patient returns  today for  2  week f/u of upper abdominal pain.   Abd CT scan in Dec 2022 did  show multiple tiny gall stones. Recent labs were unrevealing & Normal. Abdominal U/S 2 weeks ago  did show Cholelithiasis w/o evidence of acute Cholecystitis or bile dict obstruction. Hida scan 1 week ago was Negative & Normal . 2 weeks ago, he was empirically prescribed Protonix 40 mg bid and he reports sx's have improved to essentially resolved  . He is also scheduled for GI app't on 03/31/23 with Hyacinth Meeker PA.      Current Outpatient Medications on File Prior to Visit  Medication Sig   allopurinol (ZYLOPRIM) 300 MG tablet TAKE 1 TABLET DAILY    Ascorbic Acid (VITAMIN C) 1000 MG tablet Take 1,000 mg daily.   atorvastatin (LIPITOR) 40 MG tablet Take 1 tab daily for cholesterol.   brimonidine (ALPHAGAN) 0.2 % ophthalmic solution In Eye(s)   buPROPion (WELLBUTRIN XL) 300 MG 24 hr tablet Take  1 tablet  Daily    finasteride (PROSCAR) 5 MG tablet Take  1 tablet  Daily  for Prostate   gabapentin (NEURONTIN) 300 MG capsule Take  1 capsule  3 x /day for   Pain   insulin NPH-regular Human (NOVOLIN 70/30) (70-30) 100 UNIT/ML injection Inject 12-22 Units into the skin in the morning and at bedtime. 22 units in the AM, 12-15 units in the PM.   XALATAN ophth soln Place 1 drop into both eyes at bedtime.    Magnesium 500 MG TABS Take 1 tablet daily.   modafinil (PROVIGIL) 200 MG tablet Take  1/2 to 1 tablet  every morning for  Alertness   mycophenolate (MYFORTIC) 360 MG TBEC EC tablet TAKE 1 TABLET TWICE DAILY   pantoprazole (PROTONIX) 40 MG tablet Take  1 tablet  2 x / day   to Prevent Heartburn &  Indigestion                                   spironolactone (ALDACTONE) 25 MG tablet TAKE 1/2  TABLET  DAILY   tacrolimus (PROGRAF) 0.5 MG capsule Take 0.5 mg  2   times daily.  With 1mg  capsule for total of 1.5 mg twice daily   tacrolimus (PROGRAF) 1 MG capsule Take 1 mg   2 (times daily. With  0.5g capsule for total of 1.5mg  twice daily   tamsulosin   0.4 MG CAPS capsule Take daily.   traZODone  50 MG tablet TAKE ONE TABLET  BEFORE BEDTIME AS NEEDED FOR SLEEP   valsartan 80 MG tablet Take 1 tablet  daily.   warfarin (COUMADIN) 5 MG tablet Take  1 tab daily     Allergies  Allergen Reactions   Losartan Anaphylaxis   Crestor [Rosuvastatin] Other (See Comments)    Elevated LFT's   Lorazepam     Pt is unsure of reaction    Morphine And Codeine Other (See Comments)    Has no effect on pt      Problem list He has Chronic combined systolic and diastolic CHF (congestive heart failure) (HCC); CKD stage 2 due to type 2 diabetes mellitus (HCC); Hyperlipidemia associated with type 2 diabetes mellitus (HCC); BPH with obstruction/lower urinary tract symptoms; Gout; Paroxysmal atrial fibrillation (HCC); Essential hypertension; Vitamin D deficiency; Anticoagulant long-term use; Renal Transplant, s/p 09/2011; Depression, major, in remission (HCC); Thrombocytopenia (HCC); CAD (coronary artery disease) of artery bypass graft; IgA nephropathy; Essential tremor;  Overweight (BMI 25.0-29.9); Senile purpura (HCC); Diabetes mellitus type 2, controlled (HCC); Former smoker (quit 1992); HNP (herniated nucleus pulposus), cervical; S/P cervical spinal fusion; Chronic hip pain, bilateral; Mobitz I; A-V fistula (HCC); First degree AV block; History of skin cancer; B12 deficiency; and Acquired dilation of ascending aorta and aortic root (HCC) on their problem list.   Observations/Objective:  BP 112/62   Pulse 67   Temp (!) 97.5 F (36.4 C)   Resp 16   Ht 5\' 7"  (1.702 m)   Wt 169 lb 6.4 oz (76.8 kg)   SpO2 95%   BMI 26.53 kg/m   HEENT - WNL. Neck - supple.  Chest - Clear equal BS. Cor - Nl HS. RRR w/o sig MGR. PP 1(+). No edema. MS- FROM w/o deformities.  Gait Nl. Neuro -  Nl w/o focal abnormalities.   Assessment and Plan:   1. Pain of upper abdomen  - Advised to continue Pantoprazole 2 x /day til his upcoming GI App't.  - Discussed anti-dyspeptic diet triggers   2. Primary narcolepsy without cataplexy  - Refill modafinil (PROVIGIL) 200 MG tablet;    Take  1/2 to 1 tablet  every morning for  Alertness     Dispense: 90 tablet; Refill: 0   Follow Up Instructions:        I discussed the assessment and treatment plan with the patient. The patient was provided an opportunity to ask questions and all were answered. The patient agreed with the plan and demonstrated an understanding of the instructions.       The patient was advised to call back or seek an in-person evaluation if the symptoms worsen or if the condition fails to improve as anticipated.    Marinus Maw, MD

## 2023-03-12 NOTE — Patient Instructions (Signed)
Heartburn Heartburn is a type of pain or discomfort that can happen in the throat or chest. It is often described as a burning pain. It may also cause a bad, acid-like taste in the mouth. Heartburn may feel worse when you lie down or bend over, and it is often worse at night. Heartburn may be caused by stomach contents that move back up into the esophagus (reflux). Follow these instructions at home: Eating and drinking  Avoid certain foods and drinks as told by your health care provider. This may include: Coffee and tea, with or without caffeine. Drinks that contain alcohol. Energy drinks and sports drinks. Carbonated drinks or sodas. Chocolate and cocoa. Peppermint and mint flavorings. Garlic and onions. Horseradish. Spicy and acidic foods, including peppers, chili powder, curry powder, vinegar, hot sauces, and barbecue sauce. Citrus fruit juices and citrus fruits, such as oranges, lemons, and limes. Tomato-based foods, such as red sauce, chili, salsa, and pizza with red sauce. Fried and fatty foods, such as donuts, french fries, potato chips, and high-fat dressings. High-fat meats, such as hot dogs and fatty cuts of red and white meats, such as rib eye steak, sausage, ham, and bacon. High-fat dairy items, such as whole milk, butter, and cream cheese. Eat small, frequent meals instead of large meals. Avoid drinking large amounts of liquid with your meals. Avoid eating meals during the 2-3 hours before bedtime. Avoid lying down right after you eat. Do not exercise right after you eat. Lifestyle     If you are overweight, reduce your weight to an amount that is healthy for you. Ask your health care provider for guidance about a safe weight loss goal. Do not use any products that contain nicotine or tobacco. These products include cigarettes, chewing tobacco, and vaping devices, such as e-cigarettes. These can make symptoms worse. If you need help quitting, ask your health care  provider. Wear loose-fitting clothing. Do not wear anything tight around your waist that causes pressure on your abdomen. Raise (elevate) the head of your bed about 6 inches (15 cm) when you sleep. You can use a wedge to do this. Try to reduce your stress, such as with yoga or meditation. If you need help reducing stress, ask your health care provider. Medicines Take over-the-counter and prescription medicines only as told by your health care provider. Do not take aspirin or NSAIDs, such as ibuprofen, unless your health care provider told you to do so. Stop medicines only as told by your health care provider. If you stop taking some medicines too quickly, your symptoms may get worse. General instructions Pay attention to any changes in your symptoms. Keep all follow-up visits. This is important. Contact a health care provider if: You have new symptoms. You have unexplained weight loss. You have difficulty swallowing, or it hurts to swallow. You have wheezing or a persistent cough. Your symptoms do not improve with treatment. You have frequent heartburn for more than 2 weeks. Get help right away if: You suddenly have pain in your arms, neck, jaw, teeth, or back. You suddenly feel sweaty, dizzy, or light-headed. You have chest pain or shortness of breath. You vomit and your vomit looks like blood or coffee grounds. Your stool is bloody or black. Summary Heartburn is a type of pain or discomfort that can happen in the throat or chest. It is often described as a burning pain. It may also cause a bad, acid-like taste in the mouth. Avoid certain foods and drinks as told by your  health care provider. Take over-the-counter and prescription medicines only as told by your health care provider. Do not take aspirin or NSAIDs, such as ibuprofen, unless your health care provider told you to do so. Contact a health care provider if your symptoms do not improve or they get worse.

## 2023-03-14 ENCOUNTER — Ambulatory Visit: Payer: Medicare Other | Admitting: Nurse Practitioner

## 2023-03-19 ENCOUNTER — Other Ambulatory Visit: Payer: Self-pay | Admitting: Internal Medicine

## 2023-03-19 DIAGNOSIS — G47419 Narcolepsy without cataplexy: Secondary | ICD-10-CM

## 2023-03-19 MED ORDER — MODAFINIL 200 MG PO TABS: ORAL_TABLET | ORAL | 0 refills | Status: DC

## 2023-03-26 ENCOUNTER — Encounter: Payer: Self-pay | Admitting: Internal Medicine

## 2023-03-31 ENCOUNTER — Other Ambulatory Visit: Payer: Self-pay | Admitting: *Deleted

## 2023-03-31 ENCOUNTER — Encounter: Payer: Self-pay | Admitting: Physician Assistant

## 2023-03-31 ENCOUNTER — Ambulatory Visit: Payer: Medicare Other | Admitting: Physician Assistant

## 2023-03-31 ENCOUNTER — Other Ambulatory Visit: Payer: Self-pay | Admitting: Nurse Practitioner

## 2023-03-31 VITALS — BP 132/82 | HR 84 | Ht 67.0 in | Wt 175.8 lb

## 2023-03-31 DIAGNOSIS — R1011 Right upper quadrant pain: Secondary | ICD-10-CM | POA: Diagnosis not present

## 2023-03-31 DIAGNOSIS — R634 Abnormal weight loss: Secondary | ICD-10-CM

## 2023-03-31 DIAGNOSIS — N138 Other obstructive and reflux uropathy: Secondary | ICD-10-CM

## 2023-03-31 DIAGNOSIS — K7460 Unspecified cirrhosis of liver: Secondary | ICD-10-CM

## 2023-03-31 NOTE — Progress Notes (Signed)
Called patient to inform him of results per Hyacinth Meeker, PA. Patient agreed to have the CT of the ABD with contrast. Radiology notified.

## 2023-03-31 NOTE — Progress Notes (Signed)
Glad to hear he is feeling better and regained weight.  On [place] it suggests cirrhosis, however, which would be a new diagnosis for him. He has no reported history of cirrhosis. Platelets and LAEs have been normal. Not sure if this is an overread however his last cross sectional imaging was a few years ago. I'd recommend a CT abdomen to better evaluate for cirrhosis and make sure his liver is okay, if he is agreeable. Can you have your nurse contact him to see if he is willing? Thanks

## 2023-03-31 NOTE — Progress Notes (Signed)
Chief Complaint: Abdominal pain and weight loss  HPI:    Robert Arnold is a 77 year old male, known to Dr. Adela Lank, with a past medical history as listed below including CAD, ESRD with history of renal transplant, diabetes type 2, OSA and NSTEMI, who was referred to me by [name], MD for a complaint of weight loss and abdominal pain.      01/31/2021 colonoscopy for abnormal CT of the GI tract with 1 diminutive polyp in the proximal transverse colon, diverticulosis in the sigmoid colon and internal hemorrhoids as well as superficial mild rectal erythema with suspected bowel prep artifact.  At that time patient was [facility] for appendectomy.    02/25/2023 CMP with a BUN of 32, total bili 1.5.  CBC with a hemoglobin of 13 (around patient's baseline).    02/26/2023 abdominal ultrasound with cholelithiasis but no evidence of acute cholecystitis or bile duct obstruction, nodular liver contour suspicious for cirrhosis, right lower quadrant renal transplant with unremarkable ultrasound appearance.    03/04/2023 HIDA scan for recurrent gallbladder dyskinesia with patent cystic duct and normal ejection fraction.    Today, the patient presents to clinic accompanied by his wife and tells me he actually feels completely better.  He has no further abdominal pain and has gained 10 pounds over the past 2 to 3 weeks.  He just did not want to have to cancel his appointment.  He has no other complaints.    Denies fever, chills or weight loss.  Past Medical History:  Diagnosis Date   Adenomatous colon polyp    Allergy    BPH (benign prostatic hyperplasia)    CAD (coronary artery disease)    a. NSTEMI 09/2011 s/p PCI to LAD. b. CABG 01/2012 @ [facility].   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   Essential hypertension    FHx: heart disease 02/27/2018   Glaucoma    Gout    Histoplasmosis    on  itraconazole for prophylaxis   Ischemic cardiomyopathy    Mild aortic stenosis    Mild dilation of ascending aorta (HCC)    NSTEMI (non-ST elevated myocardial infarction) (HCC) 11/26/2013   2013    OSA (obstructive sleep apnea)    PAF (paroxysmal atrial fibrillation) (HCC)    Testosterone deficiency 02/27/2018   Trifascicular block     Past Surgical History:  Procedure Laterality Date   ANTERIOR CERVICAL DECOMP/DISCECTOMY FUSION N/A 05/04/2018   Procedure: Cervical Five-Six Anterior cervical decompression/discectomy/fusion;  Surgeon: Shirlean Kelly, MD;  Location: [facility] OR;  Service: Neurosurgery;  Laterality: N/A;  Cervical Five-Six Anterior cervical decompression/discectomy/fusion   AV FISTULA PLACEMENT  2011   Left forearm   COLONOSCOPY     CORONARY ANGIOPLASTY WITH STENT PLACEMENT     CORONARY ARTERY BYPASS GRAFT  2013   KIDNEY TRANSPLANT  09/25/2011   LAPAROSCOPIC APPENDECTOMY N/A 04/06/2021   Procedure: APPENDECTOMY LAPAROSCOPIC;  Surgeon: Emelia Loron, MD;  Location: [facility] OR;  Service: General;  Laterality: N/A;   PERITONEAL CATHETER INSERTION  2011    Current Outpatient Medications  Medication Sig Dispense Refill   allopurinol (ZYLOPRIM) 300 MG tablet TAKE 1 TABLET BY MOUTH DAILY TO PREVENT GOUT 90 tablet 3   Ascorbic Acid (VITAMIN C) 1000 MG tablet Take 1,000 mg by mouth daily. (Patient not taking: Reported on 03/12/2023)     atorvastatin (LIPITOR) 40 MG tablet Take 1 tab  daily for cholesterol. 90 tablet 3   brimonidine (ALPHAGAN) 0.2 % ophthalmic solution SMARTSIG:In Eye(s)     buPROPion (WELLBUTRIN XL) 300 MG 24 hr tablet Take  1 tablet  Daily for Mood, Focus & Concentration                                         /         TAKE                      BY                      MOUTH 90 tablet 3   finasteride (PROSCAR) 5 MG tablet Take  1 tablet  Daily  for Prostate 90 tablet 3   gabapentin (NEURONTIN) 300 MG capsule Take  1 capsule  3 x /day for  Pain                                                                                               /                                                                   TAKE                                         BY                                                 MOUTH (Patient not taking: Reported on 03/12/2023) 270 capsule 3   glucose blood (FREESTYLE TEST STRIPS) test strip Check blood sugar 3 to 4 times daily for medication regulation. 450 each PRN   insulin NPH-regular Human (NOVOLIN 70/30) (70-30) 100 UNIT/ML injection Inject 12-22 Units into the skin in the morning and at bedtime. 22 units in the AM, 12-15 units in the PM. 10 mL 99   latanoprost (XALATAN) 0.005 % ophthalmic solution Place 1 drop into both eyes at bedtime.   4   Magnesium 500 MG TABS Take 1 tablet (500 mg total) by mouth daily. 30 tablet    modafinil (PROVIGIL) 200 MG tablet Take  1/2 to 1 tablet  every morning for  Alertness 90 tablet 0   mycophenolate (MYFORTIC) 360 MG TBEC EC tablet TAKE 1 TABLET BY MOUTH TWICE DAILY 180 tablet 1   pantoprazole (PROTONIX) 40 MG tablet Take  1 tablet  2 x / day   to Prevent Heartburn &  Indigestion                                 /  TAKE                                         BY                                                 MOUTH 180 tablet 0   spironolactone (ALDACTONE) 25 MG tablet TAKE ONE-HALF (0.5) TABLET BY MOUTH DAILY 45 tablet 3   tacrolimus (PROGRAF) 0.5 MG capsule Take 0.5 mg by mouth 2 (two) times daily. With 1mg  capsule for total of 1.5mg  twice daily     tacrolimus (PROGRAF) 1 MG capsule Take 1 mg by mouth 2 (two) times daily. With  0.5g capsule for total of 1.5mg  twice daily     tamsulosin (FLOMAX) 0.4 MG CAPS capsule Take 0.4 mg by mouth daily.     traZODone (DESYREL) 50 MG tablet TAKE ONE TABLET BY MOUTH DAILY ONE HOUR BEFORE BEDTIME AS NEEDED FOR SLEEP 90 tablet 3   valsartan (DIOVAN) 80 MG tablet Take 1 tablet (80 mg total) by mouth daily. 90  tablet 3   warfarin (COUMADIN) 5 MG tablet Take  1 tab daily     No current facility-administered medications for this visit.    Allergies as of 03/31/2023 - Review Complete 03/12/2023  Allergen Reaction Noted   Losartan Anaphylaxis 04/16/2012   Crestor [rosuvastatin] Other (See Comments) 03/06/2013   Lorazepam  02/25/2014   Morphine and codeine Other (See Comments) 04/28/2018    Family History  Problem Relation Age of Onset   Diabetes Mother    COPD Mother    Heart attack Father        Unsure    Kidney disease Sister    Alcoholism Brother    Alzheimer's disease Brother    Alcohol abuse Brother    COPD Brother    Alcohol abuse Maternal Grandfather    Colon cancer Neg Hx    Stomach cancer Neg Hx    Rectal cancer Neg Hx    Esophageal cancer Neg Hx    Liver cancer Neg Hx    Pancreatic cancer Neg Hx     Social History   Socioeconomic History   Marital status: Married    Spouse name: Not on file   Number of children: 1   Years of education: Not on file   Highest education level: Not on file  Occupational History   Occupation: retired  Tobacco Use   Smoking status: Former    Current packs/day: 0.00    Average packs/day: 2.0 packs/day for 24.6 years (49.3 ttl pk-yrs)    Types: Cigarettes    Start date: 1968    Quit date: 11/29/1990    Years since quitting: 32.3   Smokeless tobacco: Never  Vaping Use   Vaping status: Never Used  Substance and Sexual Activity   Alcohol use: Yes    Comment: 1-2 a month   Drug use: No   Sexual activity: Not on file  Other Topics Concern   Not on file  Social History Narrative   Not on file   Social Drivers of Health   Financial Resource Strain: Not on file  Food Insecurity: Low Risk  (10/31/2022)   Received from [facility]   Hunger Vital Sign  Worried About Programme researcher, broadcasting/film/video in the Last Year: Never true    Ran Out of Food in the Last Year: Never true  Transportation Needs: Not on file (10/31/2022)  Physical Activity:  Not on file  Stress: Not on file  Social Connections: Not on file  Intimate Partner Violence: Not on file    Review of Systems:    Constitutional: No weight loss, fever or chills Skin: No rash  Cardiovascular: No chest pain Respiratory: No SOB Gastrointestinal: See HPI and otherwise negative Genitourinary: No dysuria Neurological: No headache, dizziness or syncope Musculoskeletal: No new muscle or joint pain Hematologic: No bleeding Psychiatric: No history of depression or anxiety   Physical Exam:  Vital signs: BP 132/82   Pulse 84   Ht 5\' 7"  (1.702 m)   Wt 175 lb 12.8 oz (79.7 kg)   BMI 27.53 kg/m    Constitutional:   Pleasant Caucasian male appears to be in NAD, Well developed, Well nourished, alert and cooperative Head:  Normocephalic and atraumatic. Eyes:   PEERL, EOMI. No icterus. Conjunctiva pink. Ears:  Normal auditory acuity. Neck:  Supple Throat: Oral cavity and pharynx without inflammation, swelling or lesion.  Respiratory: Respirations even and unlabored. Lungs clear to auscultation bilaterally.   No wheezes, crackles, or rhonchi.  Cardiovascular: Normal S1, S2. No MRG. Regular rate and rhythm. No peripheral edema, cyanosis or pallor.  Gastrointestinal:  Soft, nondistended, nontender. No rebound or guarding. Normal bowel sounds. No appreciable masses or hepatomegaly. Rectal:  Not performed.  Msk:  Symmetrical without gross deformities. Without edema, no deformity or joint abnormality.  Neurologic:  Alert and  oriented x4;  grossly normal neurologically.  Skin:   Dry and intact without significant lesions or rashes. Psychiatric: Demonstrates good judgement and reason without abnormal affect or behaviors.  RELEVANT LABS AND IMAGING: CBC    Component Value Date/Time   WBC 6.2 02/25/2023 1650   RBC 4.18 (L) 02/25/2023 1650   HGB 13.0 (L) 02/25/2023 1650   HCT 40.3 02/25/2023 1650   PLT 168 02/25/2023 1650   MCV 96.4 02/25/2023 1650   MCH 31.1 02/25/2023 1650    MCHC 32.3 02/25/2023 1650   RDW 12.4 02/25/2023 1650   LYMPHSABS 670 (L) 12/02/2022 1126   MONOABS 0.5 04/03/2021 1910   EOSABS 87 02/25/2023 1650   BASOSABS 62 02/25/2023 1650    CMP     Component Value Date/Time   NA 137 02/25/2023 1650   NA 138 11/20/2021 1012   K 5.1 02/25/2023 1650   CL 102 02/25/2023 1650   CO2 30 02/25/2023 1650   GLUCOSE 117 (H) 02/25/2023 1650   BUN 32 (H) 02/25/2023 1650   BUN 32 (H) 11/20/2021 1012   CREATININE 1.28 02/25/2023 1650   CALCIUM 10.0 02/25/2023 1650   PROT 6.8 02/25/2023 1650   PROT 6.1 03/15/2013 0744   ALBUMIN 3.9 04/03/2021 1804   ALBUMIN 4.3 03/15/2013 0744   AST 17 02/25/2023 1650   ALT 10 02/25/2023 1650   ALKPHOS 105 04/03/2021 1804   BILITOT 1.5 (H) 02/25/2023 1650   GFRNONAA >60 04/10/2021 0400   GFRNONAA 65 05/16/2020 1505   GFRAA 76 05/16/2020 1505    Assessment: 1.  Abdominal pain: Initially thought to be gallbladder, ultrasound and HIDA scan unrevealing, pain is now gone; uncertain etiology 2.  Weight loss: Patient had been losing weight but over the past 2 to 3 weeks has gained about 10 pounds  Plan: 1.  Patient doing much better now and  is actually gained some weight.  He has no other complaints today. 2.  Patient to follow in clinic with [place] as needed.  Hyacinth Meeker, PA-C [facility] 03/31/2023, 8:38 AM  Cc: [name], MD

## 2023-04-08 ENCOUNTER — Ambulatory Visit (HOSPITAL_COMMUNITY)
Admission: RE | Admit: 2023-04-08 | Discharge: 2023-04-08 | Disposition: A | Discharge status: Home / Self Care | Payer: Medicare Other | Source: Ambulatory Visit | Attending: Physician Assistant | Admitting: Physician Assistant

## 2023-04-08 DIAGNOSIS — K7460 Unspecified cirrhosis of liver: Secondary | ICD-10-CM | POA: Diagnosis present

## 2023-04-08 LAB — POCT I-STAT CREATININE: Creatinine, Ser: 1.1 mg/dL (ref 0.61–1.24)

## 2023-04-08 MED ORDER — IOHEXOL 300 MG/ML  SOLN
100.0000 mL | Freq: Once | INTRAMUSCULAR | Status: AC | PRN
  Administered 2023-04-08: 100 mL via INTRAVENOUS

## 2023-04-08 MED ORDER — SODIUM CHLORIDE (PF) 0.9 % IJ SOLN
INTRAMUSCULAR | Status: AC
  Filled 2023-04-08: qty 50

## 2023-04-16 LAB — HM DIABETES EYE EXAM

## 2023-04-22 ENCOUNTER — Telehealth: Payer: Self-pay | Admitting: Cardiovascular Disease

## 2023-04-22 NOTE — Telephone Encounter (Signed)
 Pt c/o medication issue:  1. Name of Medication:   tacrolimus  (PROGRAF ) 0.5 MG capsule  spironolactone  (ALDACTONE ) 25 MG tablet  2. How are you currently taking this medication (dosage and times per day)? As written  3. Are you having a reaction (difficulty breathing--STAT)? no  4. What is your medication issue? Pharmacy calling asking if we have checked his potassium  levels and tacroliums levels since he is taking both medications. Please give pharmacy a call back.

## 2023-04-23 ENCOUNTER — Ambulatory Visit (INDEPENDENT_AMBULATORY_CARE_PROVIDER_SITE_OTHER): Payer: Medicare Other | Admitting: Internal Medicine

## 2023-04-23 ENCOUNTER — Encounter: Payer: Self-pay | Admitting: Internal Medicine

## 2023-04-23 ENCOUNTER — Other Ambulatory Visit: Payer: Self-pay

## 2023-04-23 VITALS — BP 118/82 | HR 69 | Temp 97.7°F | Ht 67.0 in | Wt 176.0 lb

## 2023-04-23 DIAGNOSIS — I10 Essential (primary) hypertension: Secondary | ICD-10-CM | POA: Diagnosis not present

## 2023-04-23 DIAGNOSIS — E538 Deficiency of other specified B group vitamins: Secondary | ICD-10-CM

## 2023-04-23 DIAGNOSIS — E1122 Type 2 diabetes mellitus with diabetic chronic kidney disease: Secondary | ICD-10-CM

## 2023-04-23 DIAGNOSIS — E785 Hyperlipidemia, unspecified: Secondary | ICD-10-CM

## 2023-04-23 DIAGNOSIS — E1169 Type 2 diabetes mellitus with other specified complication: Secondary | ICD-10-CM

## 2023-04-23 DIAGNOSIS — I482 Chronic atrial fibrillation, unspecified: Secondary | ICD-10-CM

## 2023-04-23 DIAGNOSIS — E559 Vitamin D deficiency, unspecified: Secondary | ICD-10-CM | POA: Diagnosis not present

## 2023-04-23 DIAGNOSIS — Z79899 Other long term (current) drug therapy: Secondary | ICD-10-CM

## 2023-04-23 DIAGNOSIS — Z7901 Long term (current) use of anticoagulants: Secondary | ICD-10-CM

## 2023-04-23 DIAGNOSIS — Z794 Long term (current) use of insulin: Secondary | ICD-10-CM

## 2023-04-23 DIAGNOSIS — M1000 Idiopathic gout, unspecified site: Secondary | ICD-10-CM

## 2023-04-23 DIAGNOSIS — Z940 Kidney transplant status: Secondary | ICD-10-CM

## 2023-04-23 DIAGNOSIS — N182 Chronic kidney disease, stage 2 (mild): Secondary | ICD-10-CM

## 2023-04-23 MED ORDER — SPIRONOLACTONE 25 MG PO TABS: 12.5000 mg | ORAL_TABLET | Freq: Every day | ORAL | 2 refills | Status: DC

## 2023-04-23 NOTE — Patient Instructions (Signed)
Due to recent changes in healthcare laws, you may see the results of your imaging and laboratory studies on MyChart before your provider has had a chance to review them.  We understand that in some cases there may be results that are confusing or concerning to you. Not all laboratory results come back in the same time frame and the provider may be waiting for multiple results in order to interpret others.  Please give us 48 hours in order for your provider to thoroughly review all the results before contacting the office for clarification of your results.  ++++++++++++++++++++++++++  Vit D  & Vit C 1,000 mg   are recommended to help protect  against the Covid-19 and other Corona viruses.    Also it's recommended  to take  Zinc 50 mg  to help  protect against the Covid-19   and best place to get  is also on [employer]  and don't pay more than 6-8 cents /pill !   +++++++++++++++++++++++++++++++++++++++ Recommend Adult Low Dose Aspirin or  coated  Aspirin 81 mg daily  To reduce risk of Colon Cancer 40 %,  Skin Cancer 26 % ,  Melanoma 46%  and  Pancreatic cancer 60% +++++++++++++++++++++++++++++++++++++++++ Vitamin D goal  is between 70-100.  Please make sure that you are taking your Vitamin D as directed.  It is very important as a natural anti-inflammatory  helping hair, skin, and nails, as well as reducing stroke and heart attack risk.  It helps your bones and helps with mood. It also decreases numerous cancer risks so please take it as directed.  Low Vit D is associated with a 200-300% higher risk for CANCER  and 200-300% higher risk for HEART   ATTACK  &  STROKE.   ...................................... It is also associated with higher death rate at younger ages,  autoimmune diseases like Rheumatoid arthritis, Lupus, Multiple Sclerosis.    Also many other serious conditions, like depression, Alzheimer's Dementia, infertility, muscle aches, fatigue, fibromyalgia - just to name  a few. +++++++++++++++++++++++++++++++++++++++++ Recommend the book "The END of DIETING" by Dr Joel Fuhrman  & the book "The END of DIABETES " by Dr Joel Fuhrman At [employer] - get book & Audio CD's    Being diabetic has a  300% increased risk for heart attack, stroke, cancer, and alzheimer- type vascular dementia. It is very important that you work harder with diet by avoiding all foods that are white. Avoid white rice (brown & wild rice is OK), white potatoes (sweetpotatoes in moderation is OK), White bread or wheat bread or anything made out of white flour like bagels, donuts, rolls, buns, biscuits, cakes, pastries, cookies, pizza crust, and pasta (made from white flour & egg whites) - vegetarian pasta or spinach or wheat pasta is OK. Multigrain breads like Arnold's or [employer], or multigrain sandwich thins or flatbreads.  Diet, exercise and weight loss can reverse and cure diabetes in the early stages.  Diet, exercise and weight loss is very important in the control and prevention of complications of diabetes which affects every system in your body, ie. Brain - dementia/stroke, eyes - glaucoma/blindness, heart - heart attack/heart failure, kidneys - dialysis, stomach - gastric paralysis, intestines - malabsorption, nerves - severe painful neuritis, circulation - gangrene & loss of a leg(s), and finally cancer and Alzheimers.    I recommend avoid fried & greasy foods,  sweets/candy, white rice (brown or wild rice or Quinoa is OK), white potatoes (sweet potatoes are OK) - anything   made from white flour - bagels, doughnuts, rolls, buns, biscuits,white and wheat breads, pizza crust and traditional pasta made of white flour & egg white(vegetarian pasta or spinach or wheat pasta is OK).  Multi-grain bread is OK - like multi-grain flat bread or sandwich thins. Avoid alcohol in excess. Exercise is also important.    Eat all the vegetables you want - avoid meat, especially red meat and dairy - especially  cheese.  Cheese is the most concentrated form of trans-fats which is the worst thing to clog up our arteries. Veggie cheese is OK which can be found in the fresh produce section at [employer] or [employer] or [employer]  +++++++++++++++++++++++++++++++++++++++ DASH Eating Plan  DASH stands for "Dietary Approaches to Stop Hypertension."   The DASH eating plan is a healthy eating plan that has been shown to reduce high blood pressure (hypertension). Additional health benefits may include reducing the risk of type 2 diabetes mellitus, heart disease, and stroke. The DASH eating plan may also help with weight loss. WHAT DO I NEED TO KNOW ABOUT THE DASH EATING PLAN? For the DASH eating plan, you will follow these general guidelines: Choose foods with a percent daily value for sodium of less than 5% (as listed on the food label). Use salt-free seasonings or herbs instead of table salt or sea salt. Check with your health care provider or pharmacist before using salt substitutes. Eat lower-sodium products, often labeled as "lower sodium" or "no salt added." Eat fresh foods. Eat more vegetables, fruits, and low-fat dairy products. Choose whole grains. Look for the word "whole" as the first word in the ingredient list. Choose fish  Limit sweets, desserts, sugars, and sugary drinks. Choose heart-healthy fats. Eat veggie cheese  Eat more home-cooked food and less restaurant, buffet, and fast food. Limit fried foods. Cook foods using methods other than frying. Limit canned vegetables. If you do use them, rinse them well to decrease the sodium. When eating at a restaurant, ask that your food be prepared with less salt, or no salt if possible.                      WHAT FOODS CAN I EAT? Read Dr Joel Fuhrman's books on The End of Dieting & The End of Diabetes  Grains Whole grain or whole wheat bread. Brown rice. Whole grain or whole wheat pasta. Quinoa, bulgur, and whole grain cereals. Low-sodium  cereals. Corn or whole wheat flour tortillas. Whole grain cornbread. Whole grain crackers. Low-sodium crackers.  Vegetables Fresh or frozen vegetables (raw, steamed, roasted, or grilled). Low-sodium or reduced-sodium tomato and vegetable juices. Low-sodium or reduced-sodium tomato sauce and paste. Low-sodium or reduced-sodium canned vegetables.   Fruits All fresh, canned (in natural juice), or frozen fruits.  Protein Products  All fish and seafood.  Dried beans, peas, or lentils. Unsalted nuts and seeds. Unsalted canned beans.  Dairy Low-fat dairy products, such as skim or 1% milk, 2% or reduced-fat cheeses, low-fat ricotta or cottage cheese, or plain low-fat yogurt. Low-sodium or reduced-sodium cheeses.  Fats and Oils Tub margarines without trans fats. Light or reduced-fat mayonnaise and salad dressings (reduced sodium). Avocado. Safflower, olive, or canola oils. Natural peanut or almond butter.  Other Unsalted popcorn and pretzels. The items listed above may not be a complete list of recommended foods or beverages. Contact your dietitian for more options.  +++++++++++++++  WHAT FOODS ARE NOT RECOMMENDED? Grains/ White flour or wheat flour White bread. White pasta. White rice. Refined   cornbread. Bagels and croissants. Crackers that contain trans fat.  Vegetables  Creamed or fried vegetables. Vegetables in a . Regular canned vegetables. Regular canned tomato sauce and paste. Regular tomato and vegetable juices.  Fruits Dried fruits. Canned fruit in light or heavy syrup. Fruit juice.  Meat and Other Protein Products Meat in general - RED meat & White meat.  Fatty cuts of meat. Ribs, chicken wings, all processed meats as bacon, sausage, bologna, salami, fatback, hot dogs, bratwurst and packaged luncheon meats.  Dairy Whole or 2% milk, cream, half-and-half, and cream cheese. Whole-fat or sweetened yogurt. Full-fat cheeses or blue cheese. Non-dairy creamers and whipped toppings.  Processed cheese, cheese spreads, or cheese curds.  Condiments Onion and garlic salt, seasoned salt, table salt, and sea salt. Canned and packaged gravies. Worcestershire sauce. Tartar sauce. Barbecue sauce. Teriyaki sauce. Soy sauce, including reduced sodium. Steak sauce. Fish sauce. Oyster sauce. Cocktail sauce. Horseradish. Ketchup and mustard. Meat flavorings and tenderizers. Bouillon cubes. Hot sauce. Tabasco sauce. Marinades. Taco seasonings. Relishes.  Fats and Oils Butter, stick margarine, lard, shortening and bacon fat. Coconut, palm kernel, or palm oils. Regular salad dressings.  Pickles and olives. Salted popcorn and pretzels.  The items listed above may not be a complete list of foods and beverages to avoid.  ================================= Warfarin Coagulopathy  Warfarin coagulopathy refers to bleeding that may occur as a complication of the medicine warfarin. This can be life-threatening. Warfarin is a blood thinner (anticoagulant). Anticoagulants prevent dangerous blood clots. Bleeding is the most common and most serious complication of warfarin. While taking warfarin, you will need to have blood tests (prothrombin tests, or PT tests) regularly to measure your blood clotting time. The PT test results will be reported as the international normalized ratio (INR). The INR tells your health care provider whether your dosage of warfarin needs to be changed. The longer it takes your blood to clot, the higher the INR. Your risk of warfarin coagulopathy increases as your INR increases. What are the causes? This condition may be caused by: Taking too much warfarin (overdose). Underlying medical conditions. Dietary changes. Interactions with medicines, supplements, or alcohol. What are the signs or symptoms? Warfarin coagulopathy may cause bleeding from anywhere. Symptoms may include: Bleeding from the gums, or a nosebleed that is not easily stopped. Blood in your stool or urine.  This may look like bright red, dark, or black, tarry stool, or pink, red, or brown urine. Unusual bruising or bruising easily, or a cut that does not stop bleeding within 10 minutes. Coughing up or vomiting blood. Feeling nauseous for longer than 1 day. Broken blood vessels in the eye. Abdominal or back pain with or without bruising. Unusual vaginal bleeding. Swelling or pain at an injection site. Skin scarring due to tissue death of fatty tissue. This may cause pain in the waist, thighs, or buttocks. This is more common among women. Symptoms may also include stroke signs, such as: Sudden, severe headache. Sudden weakness or numbness of the face, arm, or leg, especially on one side of the body. Sudden confusion. Difficulty speaking or understanding speech. Sudden trouble seeing out of one or both eyes. Unexpected difficulty walking. Dizziness. Loss of balance or coordination. How is this diagnosed? This condition is diagnosed after your health care provider places you on warfarin and then tests your blood's ability to clot. Prothrombin time (PT) clotting tests are used to monitor your clotting ability. How is this treated? If you have bleeding, you may be treated with vitamin K.  Vitamin K helps the blood to clot. You may also receive donated plasma. Plasma is the liquid part of blood. It contains substances that help the blood clot. If you have life-threatening bleeding, you may be given other medicines through an IV. Follow these instructions at home:  Medicines  Take warfarin exactly as told by your health care provider, at the same time every day. Doing this helps you avoid bleeding or blood clots that could result in serious injury, pain, or disability. Contact your health care provider if a dose is forgotten or missed. Do not change or take additional doses to make up for missed or accidental extra doses. Many prescription, over-the-counter, and pain medicines, as well as vitamins,  herbs, and supplements can interfere with warfarin. Talk with your health care provider or pharmacist before starting or stopping any new medicines. Your warfarin dosage may need to be adjusted.  Eating and drinking  It is important to maintain a normal, balanced diet while taking warfarin. Avoid major changes in your diet. If you are planning to change your diet, talk with your health care provider before making changes. Your health care provider may recommend that you work with a dietitian. Vitamin K makes warfarin less effective. It is found in many foods. Eat a consistent amount of foods that contain vitamin K. For example, you may decide to eat 2 vitamin K-containing foods each day. Eating the same amount each day enables your health care provider to set the correct dose of warfarin.  Tests  Make sure to have PT tests at least once every 4-6 weeks while you are taking warfarin. Ask your health care provider what your target INR range is. Make sure you always know your target range. If your INR is not in your target range, your health care provider may adjust your dosage.  Preventing bleeding and injury  Some common over-the-counter medicines and supplements may increase the risk of bleeding while taking warfarin, including: Aspirin. NSAIDs, such as ibuprofen or naproxen. Vitamin E. Fish oils. Avoid situations that cause bleeding by: Using a softer toothbrush. Flossing with waxed floss, not unwaxed floss. Shaving with an Copy, not with a blade. Limiting your use of sharp objects. Avoiding potentially harmful activities, such as contact sports.  General instructions  Wear a medical alert bracel et or carry a card that lists what medicines you take. Make sure that all health care providers, including your dentist, know that you are taking warfarin. If you plan to breastfeed or become pregnant while taking warfarin, talk with your health care provider. Avoid alcohol, drugs,  and products that contain nicotine and tobacco. If you change the amount of nicotine, tobacco, or alcohol you use, tell your health care provider. Keep all follow-up visits, including visits for lab tests. This is important.  Contact a health care provider if  You miss a dose. You take an extra dose. You plan to have any kind of surgery or procedure. You may have to stop taking warfarin before your surgery. You are unable to take your medicine due to nausea, vomiting, or diarrhea. You have any major changes in your diet, or you plan to make major changes in your diet. You start or stop any over-the-counter medicine, prescription medicine, or dietary supplement. You become pregnant, plan to become pregnant, or think you may be pregnant. You have menstrual periods that are heavier than usual, or unusual vaginal bleeding. You have unusual bruising. You lose your appetite. You have a fever. You have  diarrhea that lasts for more than 24 hours.  Get help right away if:  You develop symptoms of an allergic reaction, such as: Swelling of the lips, face, tongue, mouth, or throat. Rash. Itching. Itchy, red, swollen areas of skin (hives). Trouble breathing. Chest tightness.  You have any symptoms of a stroke. "BE FAST" is an easy way to remember the main warning signs of a stroke: B - Balance. Signs are dizziness, sudden trouble walking, or loss of balance. E - Eyes. Signs are trouble seeing or a sudden change in vision. F - Face. Signs are sudden weakness or numbness of the face, or the face or eyelid drooping on one side. A - Arms. Signs are weakness or numbness in an arm. This happens suddenly and usually on one side of the body. S - Speech. Signs are sudden trouble speaking, slurred speech, or trouble understanding what people say. T - Time. Time to call emergency services. Write down what time symptoms started.  You have other signs of stroke, such as: A sudden, severe headache with  no known cause. Nausea or vomiting. Seizure. You have signs or symptoms of a blood clot, such as: Pain or swelling in your leg or arm. Skin that is red or warm to the touch on your arm or leg. Shortness of breath or difficulty breathing. Chest pain. Unexplained fever. You have: A fall or an accident, especially if you hit your head. Blood in your urine. Your urine may look reddish, pinkish, or tea-colored. Blood in your stool. Your stool may be black or bright red. Bleeding that does not stop after applying pressure to the area for 30 minutes. Severe pain in your joints or back. Purple or blue toes. Skin ulcers that do not go away. You vomit blood or cough up blood. The blood may be bright red, or it may look like coffee grounds.  These symptoms may represent a serious problem that is an emergency. Do not wait to see if the symptoms will go away. Get medical help right away. Call your local emergency services (911 in the [place] Do not drive yourself to the hospital.  Summary  Warfarin needs to be closely monitored with blood tests. It is very important to keep all lab visits and follow-up visits with your health care provider. Make sure you know your target INR range and your warfarin dosage. Monitor how much vitamin K you eat every day. Try to eat the same amount every day. Wear or carry identification that says you are taking warfarin. Take warfarin at the same time every day. Call your health care provider if you miss a dose or if you take an extra dose. Do not change the dosage of warfarin on your own. Know the signs and symptoms of blood clots, bleeding, and stroke. Know when to get emergency medical help.

## 2023-04-23 NOTE — Progress Notes (Signed)
[facility]      ADULT   &   ADOLESCENT      INTERNAL MEDICINE  [name], M.D.          Rance Muir, ANP        Adela Glimpse, FNP  [facility] [address]  [place], [place]. [address] Telephone [phone] Telefax [phone]   Future Appointments  Date Time Provider Department  04/23/2023                          ov 10:30 AM [name], MD [facility]  05/21/2023                         gi ov 11:00 AM Unk Lightning, PA [facility]  07/16/2023                         cpe  9:00 AM Adela Glimpse, NP [facility]  12/02/2023                         wellness 11:00 AM Raynelle Dick, NP [facility]    History of Present Illness:       This very nice nice 78 y.o. MWM  presents for Coag  follow up with HTN, ASHD - MI in June 2013-->>CABG /pAfib, HLD and Vitamin D Deficiency.    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 He was switched to Insulin in 2013 post transplant. Patient also has hx/o gout controlled on his meds .            After his Kidney Transplant in 2013, he had a post-op  acute MI with PCA/Stenting and later CABG .  Patient 's BP is at goal - 118/82 . Patient  has had no complaints of any cardiac type chest pain, palpitations, dyspnea Robert Arnold /PND, dizziness, claudication or dependent edema.                                                    He also has hx/o Afib and is on coumadin since cardiac surgery . Patient has had no complaints of any cardiac type chest pain, palpitations, dyspnea / orthopnea / PND, dizziness, claudication, or dependent edema.    Current Outpatient Medications  Medication Instructions   allopurinol(ZYLOPRIM) 300 MG tablet TAKE 1 TABLE   DAILY    atorvastatin 40 MG tablet Take 1 tab daily for cholesterol.   ALPHAGAN 0.2 % ophth soln    buPROPion  XL 300 MG 24 hr  Take  1 tablet  Daily    finasteride  5 MG  tablet TAKE 1 TABLET DAILY   gabapentin  300 MG capsule Take  1 capsule  3 x /day   NOVOLIN 70/30) Inject 12-22 Units - morning and bedtime. 22 units in the AM, 12-15 units - PM.   XALATAN 0.005 % ophth soln 1 drop Daily at bedtime   Magnesium 500 mg   Daily   PROVIGIL 200 MG tablet Take  1/2 to 1 tablet  every morning   mycophenolate (MYFORTIC) 360 MG  TAKE 1 TABLET  TWICE DAILY  Pantoprazole 40 MG tablet Take  1 tablet  2 x / day     spironolactone (ALDACTONE) 25 MG tablet TAKE 1/2  TABLET DAILY   tacrolimus (PROGRAF)   1.0 mg 2 times daily   tacrolimus (PROGRAF)  0.5 mg,  2 times daily   tamsulosin 0.4 mg,  Daily   traZODone 50 MG tablet TAKE ONE TABLET BEDTIME    valsartan 80 mg  Daily   vitamin C 1,000 mg  Daily   warfarin  5 MG tablet Take  1 tab daily     Allergies  Allergen Reactions   Losartan Anaphylaxis   Crestor [Rosuvastatin] Other (See Comments)    Elevated LFT's   Lorazepam     Pt is unsure of reaction    Morphine And Related Other (See Comments)    Has no effect on pt      Problem list He has Chronic combined systolic and diastolic CHF (congestive heart failure) (HCC); CKD stage 2 due to type 2 diabetes mellitus (HCC); Hyperlipidemia associated with type 2 diabetes mellitus (HCC); BPH with obstruction/lower urinary tract symptoms; Gout; A-fib (HCC); Essential hypertension; Vitamin D deficiency; Anticoagulant long-term use; Renal Transplant, s/p 09/2011; Depression, major, in remission (HCC); Thrombocytopenia (HCC); CAD (coronary artery disease) of artery bypass graft; IgA nephropathy; Essential tremor; Overweight (BMI 25.0-29.9); Senile purpura (HCC); Diabetes mellitus type 2, controlled (HCC); Former smoker (quit 1992); HNP (herniated nucleus pulposus), cervical; S/P cervical spinal fusion; Chronic hip pain, bilateral; Hypercoagulopathy (HCC); Mobitz I; A-V fistula (HCC); First degree AV block; History of skin cancer; B12 deficiency; and Acquired dilation of ascending  aorta and aortic root (HCC) on their problem list.   Observations/Objective:  BP 118/82   Pulse 69   Temp 97.7 F (36.5 C)   Ht 5\' 7"  (1.702 m)   Wt 176 lb (79.8 kg)   SpO2 99%   BMI 27.57 kg/m   HEENT - WNL. Neck - supple.  Chest - Clear equal BS. Cor - Nl HS. RRR w/o sig MGR. PP 1(+). No edema. MS- FROM w/o deformities.  Gait Nl. Neuro -  Nl w/o focal abnormalities.   Assessment and Plan:  1. Essential hypertension  - CBC with Differential/Platelet - COMPLETE METABOLIC PANEL WITH GFR - Protime-INR  2. Type 2 diabetes mellitus with stage 2 chronic kidney  disease, with long-term current use of insulin (HCC)  - COMPLETE METABOLIC PANEL WITH GFR  3. Renal transplant, status post  - COMPLETE METABOLIC PANEL WITH GFR  4. Chronic atrial fibrillation (HCC)  - Protime-INR  5. Anticoagulant long-term use  - Protime-INR  6. Medication management  - CBC with Differential/Platelet - COMPLETE METABOLIC PANEL WITH GFR    Follow Up Instructions:        I discussed the assessment and treatment plan with the patient. The patient was provided an opportunity to ask questions and all were answered. The patient agreed with the plan and demonstrated an understanding of the instructions.       The patient was advised to call back or seek an in-person evaluation if the symptoms worsen or if the condition fails to improve as anticipated.    Robert Maw, MD

## 2023-04-24 LAB — CBC WITH DIFFERENTIAL/PLATELET
Absolute Lymphocytes: 867 {cells}/uL (ref 850–3900)
Absolute Monocytes: 348 {cells}/uL (ref 200–950)
Basophils Absolute: 62 {cells}/uL (ref 0–200)
Basophils Relative: 1.4 %
Eosinophils Absolute: 101 {cells}/uL (ref 15–500)
Eosinophils Relative: 2.3 %
HCT: 37.1 % — ABNORMAL LOW (ref 38.5–50.0)
Hemoglobin: 12.1 g/dL — ABNORMAL LOW (ref 13.2–17.1)
MCH: 31.7 pg (ref 27.0–33.0)
MCHC: 32.6 g/dL (ref 32.0–36.0)
MCV: 97.1 fL (ref 80.0–100.0)
MPV: 9.7 fL (ref 7.5–12.5)
Monocytes Relative: 7.9 %
Neutro Abs: 3023 {cells}/uL (ref 1500–7800)
Neutrophils Relative %: 68.7 %
Platelets: 144 10*3/uL (ref 140–400)
RBC: 3.82 10*6/uL — ABNORMAL LOW (ref 4.20–5.80)
RDW: 12.4 % (ref 11.0–15.0)
Total Lymphocyte: 19.7 %
WBC: 4.4 10*3/uL (ref 3.8–10.8)

## 2023-04-24 LAB — PROTIME-INR
INR: 1.8 — ABNORMAL HIGH
Prothrombin Time: 18.2 s — ABNORMAL HIGH (ref 9.0–11.5)

## 2023-04-24 LAB — COMPLETE METABOLIC PANEL WITH GFR
AG Ratio: 1.8 (calc) (ref 1.0–2.5)
ALT: 20 U/L (ref 9–46)
AST: 21 U/L (ref 10–35)
Albumin: 3.8 g/dL (ref 3.6–5.1)
Alkaline phosphatase (APISO): 111 U/L (ref 35–144)
BUN/Creatinine Ratio: 25 (calc) — ABNORMAL HIGH (ref 6–22)
BUN: 31 mg/dL — ABNORMAL HIGH (ref 7–25)
CO2: 29 mmol/L (ref 20–32)
Calcium: 9.5 mg/dL (ref 8.6–10.3)
Chloride: 104 mmol/L (ref 98–110)
Creat: 1.22 mg/dL (ref 0.70–1.28)
Globulin: 2.1 g/dL (ref 1.9–3.7)
Glucose, Bld: 104 mg/dL — ABNORMAL HIGH (ref 65–99)
Potassium: 4.5 mmol/L (ref 3.5–5.3)
Sodium: 138 mmol/L (ref 135–146)
Total Bilirubin: 0.9 mg/dL (ref 0.2–1.2)
Total Protein: 5.9 g/dL — ABNORMAL LOW (ref 6.1–8.1)
eGFR: 62 mL/min/{1.73_m2} (ref >=60)

## 2023-04-24 LAB — LIPID PANEL
Cholesterol: 130 mg/dL (ref <200)
HDL: 64 mg/dL (ref >=40)
LDL Cholesterol (Calc): 52 mg/dL
Non-HDL Cholesterol (Calc): 66 mg/dL (ref <130)
Total CHOL/HDL Ratio: 2 (calc) (ref <5.0)
Triglycerides: 48 mg/dL (ref <150)

## 2023-04-24 LAB — TSH: TSH: 2.05 m[IU]/L (ref 0.40–4.50)

## 2023-04-24 LAB — VITAMIN D 25 HYDROXY (VIT D DEFICIENCY, FRACTURES): Vit D, 25-Hydroxy: 53 ng/mL (ref 30–100)

## 2023-04-24 LAB — HEMOGLOBIN A1C
Hgb A1c MFr Bld: 5.7 %{Hb} — ABNORMAL HIGH (ref <5.7)
Mean Plasma Glucose: 117 mg/dL
eAG (mmol/L): 6.5 mmol/L

## 2023-04-24 LAB — MAGNESIUM: Magnesium: 2.2 mg/dL (ref 1.5–2.5)

## 2023-04-24 LAB — VITAMIN B12: Vitamin B-12: 442 pg/mL (ref 200–1100)

## 2023-04-24 LAB — URIC ACID: Uric Acid, Serum: 3.9 mg/dL — ABNORMAL LOW (ref 4.0–8.0)

## 2023-04-24 NOTE — Telephone Encounter (Signed)
Returned call to pharmacy and left detailed voicemail. Also returned the fax I received from them this morning. We are aware of potential ixn between spironolactone and tacrolimus. Pt has been taking both for years and potassium level is being monitored. CMET done yesterday shows K of 4.5. Advised on both coorespondances that prescribing provider of tacrolimus should be monitoring levels of that drug.

## 2023-04-24 NOTE — Progress Notes (Signed)
- Test results slightly outside the reference range are not unusual. If there is anything important, I will review this with you,  otherwise it is considered normal test values.  If you have further questions,  please do not hesitate to contact me at the office or via My Chart.   =========================================================================  -  PT / INR = 1.8 x is on the low side - Suggest go back to 1 whole tablet / day                                 ( Goal is 2.2 to 3.0 x)   =========================================================================  -   A1c = 5.7% --Better, but still elevated in the borderline and                                                                            early or pre-diabetes range which has the same   300% increased risk for heart attack, stroke, cancer and                                                               alzheimer- type vascular dementia as full blown diabetes.   But the good news is that diet, exercise with                                                                        weight loss can cure the early diabetes at this point.  =========================================================================  -  Uric acid / Gout test is Normal - Please continue Allopurinol same   =========================================================================  -  Chol = 130   &  LDL = 52   - Both  Excellent   - Very low risk for Heart Attack  / Stroke  =========================================================================  -  Vitamin D =   53 is a little low   - Vitamin D goal is between 70-100.   - Please make sure that you are taking your Vitamin D as directed                                                                                  - Or add 5,000 units more  /day !   - It is very important as a natural anti-inflammatory and helping the  immune system protect against viral  infections, like Flu  & the Covid    - Also helps hair, skin, and nails, as well as reducing stroke and heart attack risk.   - It helps your bones  &  and helps with mood.  - It also decreases numerous cancer risks, so please                                                                                           take it as directed.   - Low Vit D is associated with a 200-300% higher risk for CANCER   and 200-300% higher risk for HEART   ATTACK  &  STROKE.    - It is also associated with higher death rate at younger ages,   autoimmune diseases like Rheumatoid arthritis, Lupus, Multiple Sclerosis.     - Also many other serious conditions, like depression, Alzheimer's Dementia                                                                             muscle aches, fatigue, fibromyalgia   =========================================================================  -  Vitamin B12 =   422 -     Very Low  (Ideal or Goal Vit B12 is between 450 - 1,100)   Low Vit B12 may be associated with Anemia , Fatigue,                                             Peripheral Neuropathy, Dementia, "Brain Fog", & Depression    - Recommend take a sub-lingual form of Vitamin B12 tablet                                      1,000 to 5,000 mcg tab that you dissolve under your tongue /   - Can get Lavonia Dana - best price at [employer] or on [employer]  =========================================================================  All Else - CBC - Kidneys - Electrolytes - Liver - Magnesium & Thyroid    - all  Normal / OK  =========================================================================

## 2023-05-21 ENCOUNTER — Ambulatory Visit (INDEPENDENT_AMBULATORY_CARE_PROVIDER_SITE_OTHER): Payer: Medicare Other | Admitting: Physician Assistant

## 2023-05-21 ENCOUNTER — Encounter: Payer: Self-pay | Admitting: Physician Assistant

## 2023-05-21 VITALS — BP 106/70 | HR 68 | Ht 66.0 in | Wt 173.0 lb

## 2023-05-21 DIAGNOSIS — K7460 Unspecified cirrhosis of liver: Secondary | ICD-10-CM

## 2023-05-21 NOTE — Patient Instructions (Addendum)
_______________________________________________________  If your blood pressure at your visit was 140/90 or greater, please contact your primary care physician to follow up on this. _______________________________________________________  If you are age 76 or older, your body mass index should be between 23-30. Your Body mass index is 27.92 kg/m. If this is out of the aforementioned range listed, please consider follow up with your Primary Care Provider. ________________________________________________________  The [facility] providers would like to encourage you to use [facility] to communicate with providers for non-urgent requests or questions.  Due to long hold times on the telephone, sending your provider a message by [facility] may be a faster and more efficient way to get a response.  Please allow 48 business hours for a response.  Please remember that this is for non-urgent requests.  _______________________________________________________  Your provider has requested that you have lab work completed.  Due to recent changes in healthcare laws, you may see the results of your imaging and laboratory studies on MyChart before your provider has had a chance to review them.  We understand that in some cases there may be results that are confusing or concerning to you. Not all laboratory results come back in the same time frame and the provider may be waiting for multiple results in order to interpret others.  Please give [place] 48 hours in order for your provider to thoroughly review all the results before contacting the office for clarification of your results.   Thank you for entrusting me with your care and choosing [facility].  Hyacinth Meeker, PA-C

## 2023-05-21 NOTE — Progress Notes (Signed)
Chief Complaint: Cirrhosis  HPI:    Robert Arnold is a 78 year old male with a past medical history as listed below including CAD, ESRD and multiple others, who presents to clinic today for follow-up of his newly diagnosed cirrhosis.    01/31/2021 colonoscopy for abnormal CT of the GI tract with 1 diminutive polyp in the proximal transverse colon, diverticulosis in the sigmoid colon and internal hemorrhoids as well as superficial mild rectal erythema with suspected bowel prep artifact.  At that time patient was [facility] for appendectomy.    02/25/2023 CMP with a BUN of 32, total bili 1.5.  CBC with a hemoglobin of 13 (around patient's baseline).    02/26/2023 abdominal ultrasound with cholelithiasis but no evidence of acute cholecystitis or bile duct obstruction, nodular liver contour suspicious for cirrhosis, right lower quadrant renal transplant with unremarkable ultrasound appearance.    03/04/2023 HIDA scan for recurrent gallbladder dyskinesia with patent cystic duct and normal ejection fraction.    03/31/2023 patient seen for abdominal pain.  At that time completely better on his abdominal pain and weight loss and had been gaining weight over the past 2 to 3 weeks.  Ultrasound did show question of cirrhosis which was new for him.  Ordered a CT for further eval.    04/08/2023 CT abdomen pelvis showed moderate cirrhosis without hepatocellular carcinoma portal venous hypertension, right iliac fossa renal transplant without complication and cholelithiasis with bilateral nephrolithiasis.    04/23/2023 CBC with normal platelets.  CMP with normal LFTs.  INR 1.8 but patient is on Coumadin.    Today, the patient continues to tell me that he feels completely well.  He is just following up from a CT that showed cirrhosis.  He does describe that his PCP told him many years ago that he had fatty liver (looking back at his CT from December 2022 patient was noted to have diffuse fatty infiltration of the  liver), but was never given any recommendations for this it was just sort of incidental at the time.  He has had low platelets in the past, but these have normalized (looks like they were last low about 5 months ago).  Denies any family history of liver disease that he is aware of.  Denies any alcohol use ever, no history of tattoos or hepatitis.    They do ask about finding a new PCP.  Used to follow with Dr. Oneta Rack.    Denies fever, chills, weight loss, abdominal pain, nausea, vomiting or change in bowel habits.  Past Medical History:  Diagnosis Date   Adenomatous colon polyp    Allergy    BPH (benign prostatic hyperplasia)    CAD (coronary artery disease)    a. NSTEMI 09/2011 s/p PCI to LAD. b. CABG 01/2012 @ [facility].   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   Essential hypertension    FHx: heart disease 02/27/2018   Glaucoma    Gout    Histoplasmosis    on itraconazole for prophylaxis   Ischemic cardiomyopathy    Mild aortic stenosis    Mild dilation of ascending aorta (HCC)    NSTEMI (non-ST elevated myocardial infarction) (HCC) 11/26/2013   2013    OSA (obstructive sleep apnea)    PAF (paroxysmal atrial fibrillation) (HCC)    Testosterone deficiency 02/27/2018   Trifascicular block     Past Surgical History:  Procedure  Laterality Date   ANTERIOR CERVICAL DECOMP/DISCECTOMY FUSION N/A 05/04/2018   Procedure: Cervical Five-Six Anterior cervical decompression/discectomy/fusion;  Surgeon: Shirlean Kelly, MD;  Location: [facility] OR;  Service: Neurosurgery;  Laterality: N/A;  Cervical Five-Six Anterior cervical decompression/discectomy/fusion   AV FISTULA PLACEMENT  2011   Left forearm   COLONOSCOPY     CORONARY ANGIOPLASTY WITH STENT PLACEMENT     CORONARY ARTERY BYPASS GRAFT  2013   KIDNEY TRANSPLANT  09/25/2011   LAPAROSCOPIC APPENDECTOMY N/A 04/06/2021   Procedure: APPENDECTOMY  LAPAROSCOPIC;  Surgeon: Emelia Loron, MD;  Location: [facility] OR;  Service: General;  Laterality: N/A;   PERITONEAL CATHETER INSERTION  2011    Current Outpatient Medications  Medication Sig Dispense Refill   allopurinol (ZYLOPRIM) 300 MG tablet TAKE 1 TABLET BY MOUTH DAILY TO PREVENT GOUT 90 tablet 3   Ascorbic Acid (VITAMIN C) 1000 MG tablet Take 1,000 mg by mouth daily.     atorvastatin (LIPITOR) 40 MG tablet Take 1 tab daily for cholesterol. 90 tablet 3   brimonidine (ALPHAGAN) 0.2 % ophthalmic solution SMARTSIG:In Eye(s)     buPROPion (WELLBUTRIN XL) 300 MG 24 hr tablet Take  1 tablet  Daily for Mood, Focus & Concentration                                         /         TAKE                      BY                      MOUTH 90 tablet 3   finasteride (PROSCAR) 5 MG tablet TAKE 1 TABLET BY MOUTH DAILY FOR PROSTATE 90 tablet 3   gabapentin (NEURONTIN) 300 MG capsule Take  1 capsule  3 x /day for  Pain                                                                                              /                                                                   TAKE                                         BY                                                 MOUTH 270 capsule 3   glucose blood (FREESTYLE  TEST STRIPS) test strip Check blood sugar 3 to 4 times daily for medication regulation. 450 each PRN   insulin NPH-regular Human (NOVOLIN 70/30) (70-30) 100 UNIT/ML injection Inject 12-22 Units into the skin in the morning and at bedtime. 22 units in the AM, 12-15 units in the PM. 10 mL 99   latanoprost (XALATAN) 0.005 % ophthalmic solution Place 1 drop into both eyes at bedtime.   4   Magnesium 500 MG TABS Take 1 tablet (500 mg total) by mouth daily. 30 tablet    modafinil (PROVIGIL) 200 MG tablet Take  1/2 to 1 tablet  every morning for  Alertness 90 tablet 0   mycophenolate (MYFORTIC) 360 MG TBEC EC tablet TAKE 1 TABLET BY MOUTH TWICE DAILY 180 tablet 1   tacrolimus (PROGRAF) 0.5 MG capsule  Take 0.5 mg by mouth 2 (two) times daily. With 1mg  capsule for total of 1.5mg  twice daily     tacrolimus (PROGRAF) 1 MG capsule Take 1 mg by mouth 2 (two) times daily. With  0.5g capsule for total of 1.5mg  twice daily     tamsulosin (FLOMAX) 0.4 MG CAPS capsule Take 0.4 mg by mouth daily.     traZODone (DESYREL) 50 MG tablet TAKE ONE TABLET BY MOUTH DAILY ONE HOUR BEFORE BEDTIME AS NEEDED FOR SLEEP 90 tablet 3   valsartan (DIOVAN) 80 MG tablet Take 1 tablet (80 mg total) by mouth daily. 90 tablet 3   warfarin (COUMADIN) 5 MG tablet Take  1 tab daily (Patient taking differently: Takes 5mg , 5 x a week and 2.5mg  2 x a week.)     No current facility-administered medications for this visit.    Allergies as of 05/21/2023 - Review Complete 05/21/2023  Allergen Reaction Noted   Losartan Anaphylaxis 04/16/2012   Crestor [rosuvastatin] Other (See Comments) 03/06/2013   Lorazepam  02/25/2014   Morphine and codeine Other (See Comments) 04/28/2018    Family History  Problem Relation Age of Onset   Diabetes Mother    COPD Mother    Heart attack Father        Unsure    Kidney disease Sister    Alcoholism Brother    Alzheimer's disease Brother    Alcohol abuse Brother    COPD Brother    Alcohol abuse Maternal Grandfather    Colon cancer Neg Hx    Stomach cancer Neg Hx    Rectal cancer Neg Hx    Esophageal cancer Neg Hx    Liver cancer Neg Hx    Pancreatic cancer Neg Hx     Social History   Socioeconomic History   Marital status: Married    Spouse name: Not on file   Number of children: 1   Years of education: Not on file   Highest education level: Not on file  Occupational History   Occupation: retired  Tobacco Use   Smoking status: Former    Current packs/day: 0.00    Average packs/day: 2.0 packs/day for 24.6 years (49.3 ttl pk-yrs)    Types: Cigarettes    Start date: 1968    Quit date: 11/29/1990    Years since quitting: 32.4   Smokeless tobacco: Never  Vaping Use   Vaping  status: Never Used  Substance and Sexual Activity   Alcohol use: Yes    Comment: 1-2 a month   Drug use: No   Sexual activity: Not on file  Other Topics Concern   Not on file  Social History Narrative  Not on file   Social Drivers of Health   Financial Resource Strain: Not on file  Food Insecurity: Low Risk  (10/31/2022)   Received from [facility]   Hunger Vital Sign    Worried About Running Out of Food in the Last Year: Never true    Ran Out of Food in the Last Year: Never true  Transportation Needs: Not on file (10/31/2022)  Physical Activity: Not on file  Stress: Not on file  Social Connections: Not on file  Intimate Partner Violence: Not on file    Review of Systems:    Constitutional: No weight loss, fever or chills Cardiovascular: No chest pain   Respiratory: No SOB  Gastrointestinal: See HPI and otherwise negative   Physical Exam:  Vital signs: BP 106/70 (BP Location: Left Arm, Patient Position: Sitting, Cuff Size: Normal)   Pulse 68   Ht 5\' 6"  (1.676 m)   Wt 173 lb (78.5 kg)   BMI 27.92 kg/m   Constitutional:   Pleasant elderly Caucasian male appears to be in NAD, Well developed, Well nourished, alert and cooperative Respiratory: Respirations even and unlabored. Lungs clear to auscultation bilaterally.   No wheezes, crackles, or rhonchi.  Cardiovascular: Normal S1, S2. No MRG. Regular rate and rhythm. No peripheral edema, cyanosis or pallor.  Gastrointestinal:  Soft, nondistended, nontender. No rebound or guarding. Normal bowel sounds. No appreciable masses or hepatomegaly. Rectal:  Not performed.  Psychiatric: Demonstrates good judgement and reason without abnormal affect or behaviors.  RELEVANT LABS AND IMAGING: CBC    Component Value Date/Time   WBC 4.4 04/23/2023 1034   RBC 3.82 (L) 04/23/2023 1034   HGB 12.1 (L) 04/23/2023 1034   HCT 37.1 (L) 04/23/2023 1034   PLT 144 04/23/2023 1034   MCV 97.1 04/23/2023 1034   MCH 31.7 04/23/2023 1034   MCHC  32.6 04/23/2023 1034   RDW 12.4 04/23/2023 1034   LYMPHSABS 670 (L) 12/02/2022 1126   MONOABS 0.5 04/03/2021 1910   EOSABS 101 04/23/2023 1034   BASOSABS 62 04/23/2023 1034    CMP     Component Value Date/Time   NA 138 04/23/2023 1034   NA 138 11/20/2021 1012   K 4.5 04/23/2023 1034   CL 104 04/23/2023 1034   CO2 29 04/23/2023 1034   GLUCOSE 104 (H) 04/23/2023 1034   BUN 31 (H) 04/23/2023 1034   BUN 32 (H) 11/20/2021 1012   CREATININE 1.22 04/23/2023 1034   CALCIUM 9.5 04/23/2023 1034   PROT 5.9 (L) 04/23/2023 1034   PROT 6.1 03/15/2013 0744   ALBUMIN 3.9 04/03/2021 1804   ALBUMIN 4.3 03/15/2013 0744   AST 21 04/23/2023 1034   ALT 20 04/23/2023 1034   ALKPHOS 105 04/03/2021 1804   BILITOT 0.9 04/23/2023 1034   GFRNONAA >60 04/10/2021 0400   GFRNONAA 65 05/16/2020 1505   GFRAA 76 05/16/2020 1505    Assessment: 1.  Cirrhosis: Identified on recent CT of the abdomen and pelvis, platelets were normal in the past but have been normal over the past 5 to 6 months, LFTs normal, INR is out of range given warfarin, did have history of fatty liver; most likely progression of fatty liver  Plan: 1.  Ordered further liver labs to rule out autoimmune disorders excetra.  Patient requested these to be printed out so he could bring them with the [facility] in about a week from now as he has other labs pending. 2.  Discussed fatty liver and recommendations for this. 3.  Patient to follow in clinic per recommendations after labs above. 4.  I did send a message to Dr. Adela Lank per his request to see if he recommended a certain PCP.  Hyacinth Meeker, PA-C [facility] 05/21/2023, 10:58 AM  Cc: [name], MD

## 2023-05-22 NOTE — Progress Notes (Signed)
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  More recently his platelets have been normal however he does have a history of thrombocytopenia over the past year.  He is on Coumadin, the question for him is if we should screen him for esophageal varices with an EGD.  To do so would require anesthesia, his last ejection fraction was 38% on a nuclear stress test in 2013.  We would want to know if he had varices in light of his anticoagulation use long-term and if he should be on beta-blockade to prevent this.  CT did not suggest any portal hypertension or obvious varices so I think it is unlikely that he has them right now.  Must weigh risks of doing the procedure versus benefits of it.  Victorino Dike I think it may be best for me to see him back in the office in the next few months for reassessment to discuss this.  Not sure if he discussed possible EGD with him but if we did pursue this, likely would be done at the hospital given his comorbidities.

## 2023-05-23 ENCOUNTER — Telehealth: Payer: Self-pay

## 2023-05-23 NOTE — Telephone Encounter (Signed)
Unk Lightning, PA  [place], [place], [place]  Can you let patient know that Dr. Adela Lank would like to see him back in clinic personally to further discuss his cirrhosis.  He would like to specifically discuss a possible EGD with him for variceal screening.  Thanks, JL L

## 2023-05-23 NOTE — Telephone Encounter (Signed)
-----   Message from Unk Lightning sent at 05/23/2023 11:48 AM EST ----- Regarding: FW: PCP Also let patient know that Dr. Adela Lank does not have any particular recommendations for PCP.  Thanks, JL L ----- Message ----- From: Benancio Deeds, MD Sent: 05/21/2023   4:07 PM EST To: Unk Lightning, PA Subject: RE: PCP                                        No one in particular, I am not sure who is taking new patients. I think anyone in [place] or [facility] would be fine. Thanks ----- Message ----- From: Unk Lightning, PA Sent: 05/21/2023  11:36 AM EST To: Benancio Deeds, MD Subject: PCP                                            Robert Arnold wanted to know if he had any recommendations personally for a new PCP.  They used to follow with Dr. Oneta Rack.  I told him I would ask.  JL L

## 2023-05-23 NOTE — Telephone Encounter (Signed)
Left message for patient to call back

## 2023-05-26 NOTE — Telephone Encounter (Signed)
Lm on vm for patient to return call. Please schedule patient for a routine follow up with Dr. Adela Lank for cirrhosis.  Patient has a new patient appt with Hazle Nordmann, DNP - Internal medicine.

## 2023-05-27 ENCOUNTER — Ambulatory Visit: Payer: Medicare Other | Admitting: Nurse Practitioner

## 2023-05-28 ENCOUNTER — Ambulatory Visit: Payer: Medicare Other | Admitting: Nurse Practitioner

## 2023-05-28 ENCOUNTER — Other Ambulatory Visit: Payer: Self-pay

## 2023-05-28 DIAGNOSIS — K219 Gastro-esophageal reflux disease without esophagitis: Secondary | ICD-10-CM

## 2023-05-28 MED ORDER — PANTOPRAZOLE SODIUM 40 MG PO TBEC: DELAYED_RELEASE_TABLET | ORAL | 0 refills | Status: DC

## 2023-05-28 NOTE — Telephone Encounter (Signed)
3rd attempt to reach patient. Left patient a voicemail requesting that he call back to schedule follow up with Dr. Adela Lank.   Letter mailed to patient with recommendations.

## 2023-06-05 ENCOUNTER — Ambulatory Visit: Payer: Medicare Other | Admitting: Orthopedic Surgery

## 2023-06-05 ENCOUNTER — Encounter: Payer: Self-pay | Admitting: Orthopedic Surgery

## 2023-06-05 ENCOUNTER — Ambulatory Visit (INDEPENDENT_AMBULATORY_CARE_PROVIDER_SITE_OTHER): Payer: Medicare Other | Admitting: Orthopedic Surgery

## 2023-06-05 VITALS — BP 124/72 | HR 73 | Temp 97.6°F | Resp 16 | Ht 67.0 in | Wt 177.6 lb

## 2023-06-05 DIAGNOSIS — M150 Primary generalized (osteo)arthritis: Secondary | ICD-10-CM

## 2023-06-05 DIAGNOSIS — Z940 Kidney transplant status: Secondary | ICD-10-CM

## 2023-06-05 DIAGNOSIS — E1122 Type 2 diabetes mellitus with diabetic chronic kidney disease: Secondary | ICD-10-CM

## 2023-06-05 DIAGNOSIS — Z7901 Long term (current) use of anticoagulants: Secondary | ICD-10-CM

## 2023-06-05 DIAGNOSIS — I10 Essential (primary) hypertension: Secondary | ICD-10-CM

## 2023-06-05 DIAGNOSIS — G47419 Narcolepsy without cataplexy: Secondary | ICD-10-CM

## 2023-06-05 DIAGNOSIS — D692 Other nonthrombocytopenic purpura: Secondary | ICD-10-CM

## 2023-06-05 DIAGNOSIS — I25810 Atherosclerosis of coronary artery bypass graft(s) without angina pectoris: Secondary | ICD-10-CM

## 2023-06-05 DIAGNOSIS — N182 Chronic kidney disease, stage 2 (mild): Secondary | ICD-10-CM

## 2023-06-05 DIAGNOSIS — F325 Major depressive disorder, single episode, in full remission: Secondary | ICD-10-CM

## 2023-06-05 DIAGNOSIS — I482 Chronic atrial fibrillation, unspecified: Secondary | ICD-10-CM

## 2023-06-05 DIAGNOSIS — N138 Other obstructive and reflux uropathy: Secondary | ICD-10-CM

## 2023-06-05 DIAGNOSIS — Z794 Long term (current) use of insulin: Secondary | ICD-10-CM

## 2023-06-05 DIAGNOSIS — K7460 Unspecified cirrhosis of liver: Secondary | ICD-10-CM

## 2023-06-05 DIAGNOSIS — N401 Enlarged prostate with lower urinary tract symptoms: Secondary | ICD-10-CM

## 2023-06-05 MED ORDER — NOVOLIN 70/30 (70-30) 100 UNIT/ML ~~LOC~~ SUSP: SUBCUTANEOUS | Status: DC

## 2023-06-05 MED ORDER — WARFARIN SODIUM 5 MG PO TABS: ORAL_TABLET | ORAL | Status: DC

## 2023-06-05 NOTE — Patient Instructions (Addendum)
 Try Zyrtec 10 mg by mouth every night> try for 30 days  Apply vaseline or Cerave lotion to skin after bathing   Rule of 2's on insulin> reduce MORNING insulin by 2 units x 2 days, if insulin is between 70-120 that is goal  Reduce evening insulin to 12 units  Eat snack in evening to reduce hypoglycemia while sleeping  Recommend repeat hearing evaluation

## 2023-06-05 NOTE — Progress Notes (Signed)
 Careteam: Patient Care Team: Octavia Heir, NP as PCP - General (Adult Health Nurse Practitioner) Nahser, Deloris Ping, MD as PCP - Cardiology (Cardiology) Fredrich Birks, OD as Referring Physician (Optometry) Bufford Buttner, MD as Consulting Physician (Nephrology) Armbruster, Willaim Rayas, MD as Consulting Physician (Gastroenterology) Aris Lot, MD as Consulting Physician (Dermatology)  Seen by: Hazle Nordmann, AGNP-C  PLACE OF SERVICE:  [facility]  Advanced Directive information Does Patient Have a Medical Advance Directive?: Yes, Type of Advance Directive: Healthcare Power of [place];Living will;Out of facility DNR (pink MOST or yellow form), Does patient want to make changes to medical advance directive?: No - Patient declined  Allergies  Allergen Reactions   Losartan Anaphylaxis   Crestor [Rosuvastatin] Other (See Comments)    Elevated LFT's   Lorazepam     Pt is unsure of reaction    Morphine And Codeine Other (See Comments)    Has no effect on pt     Discussed the use of AI scribe software for clinical note transcription with the patient, who gave verbal consent to proceed.  History of Present Illness    Robert Arnold. is a 79 year old male with type 2 diabetes, atrial fibrillation, and a history of renal transplant who presents to establish at [facility].   Wife present during encounter.   Previous provider recently passed away. Originally from [place]. Married. 1 stepson. Retired. Past profession in Airline pilot. Lives in one story town home.   He has type 2 diabetes and experiences recent episodes of hypoglycemia with blood sugar levels dropping below 50 mg/dL, leading to disorientation. He administers 22 units of 70/30 insulin in the morning and 12-15 units in the evening, monitoring his blood sugar twice daily. His A1c was 5.7% in January, indicating well-controlled diabetes. He consumes two meals and a snack daily, with snacks usually in the early  afternoon.  He has a history of atrial fibrillation and is on warfarin, taking 5 mg six days a week and 2.5 mg one day a week.   He is followed by a cardiologist and has a history of a MI the day after his renal transplant and HTN. Remains on atorvastatin, valsartan.   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 He is followed by a kidney specialist at [facility], visiting every 4-6 months. His kidney function is stable with tacrolimus management.  He was diagnosed with moderate cirrhosis a few months ago. Secondary to fatty liver disease. Past h/o obesity. No significant changes in liver function tests or platelets, and his INR has been stable on warfarin.  He has a history of orthopedic issues, including knee and hip pain, managed with gel injections. Followed by [facility]. He receives gel injections to knees and right hip. He will use tylenol prn for breakthrough pain. He plans to resume water exercises at the [facility] as the weather warms up.  He reports tinnitus and hearing issues, with occasional ear popping that temporarily improves his hearing. Wears bilateral hearing aids. He experiences hoarseness and thick phlegm in his throat, as well as frequent epistaxis, which he attributes to thin skin.  Remains on Wellbutrin and trazodone for depression  Remains on modafinil for narcolepsy.   His family history includes a mother who passed away at 36 from complications related to COPD and a father who died of a heart attack in his early 24s. He has a history of smoking, having quit  35 years ago after smoking up to two packs a day for ten years. He consumes alcohol occasionally, with no history of drug abuse or rehabilitation.  No regular exercise regimen.     Dental exam with cleaning within past year.   Eye examined twice yearly.   Review of Systems:  Review of Systems  Constitutional: Negative.   HENT:  Positive for congestion and  hearing loss. Negative for sore throat.   Eyes:  Negative for pain.  Respiratory:  Negative for cough and shortness of breath.   Cardiovascular:  Negative for chest pain and leg swelling.  Gastrointestinal:  Negative for abdominal pain.  Genitourinary:  Negative for dysuria and hematuria.  Musculoskeletal:  Positive for joint pain. Negative for falls.  Skin:        Thin skin, purpura  Neurological:  Negative for dizziness, weakness and headaches.  Psychiatric/Behavioral:  Positive for depression. Negative for memory loss. The patient has insomnia. The patient is not nervous/anxious.     Past Medical History:  Diagnosis Date   Adenomatous colon polyp    Allergy    BPH (benign prostatic hyperplasia)    CAD (coronary artery disease)    a. NSTEMI 09/2011 s/p PCI to LAD. b. CABG 01/2012 @ [facility].   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   Essential hypertension    FHx: heart disease 02/27/2018   Glaucoma    Gout    Histoplasmosis    on itraconazole for prophylaxis   Ischemic cardiomyopathy    Mild aortic stenosis    Mild dilation of ascending aorta (HCC)    NSTEMI (non-ST elevated myocardial infarction) (HCC) 11/26/2013   2013    OSA (obstructive sleep apnea)    PAF (paroxysmal atrial fibrillation) (HCC)    Testosterone deficiency 02/27/2018   Trifascicular block    Past Surgical History:  Procedure Laterality Date   ANTERIOR CERVICAL DECOMP/DISCECTOMY FUSION N/A 05/04/2018   Procedure: Cervical Five-Six Anterior cervical decompression/discectomy/fusion;  Surgeon: Shirlean Kelly, MD;  Location: [facility] OR;  Service: Neurosurgery;  Laterality: N/A;  Cervical Five-Six Anterior cervical decompression/discectomy/fusion   AV FISTULA PLACEMENT  2011   Left forearm   COLONOSCOPY     CORONARY ANGIOPLASTY WITH STENT PLACEMENT     CORONARY ARTERY BYPASS GRAFT  2013   KIDNEY TRANSPLANT   09/25/2011   LAPAROSCOPIC APPENDECTOMY N/A 04/06/2021   Procedure: APPENDECTOMY LAPAROSCOPIC;  Surgeon: Emelia Loron, MD;  Location: [facility] OR;  Service: General;  Laterality: N/A;   PERITONEAL CATHETER INSERTION  2011   Social History:   reports that he quit smoking about 32 years ago. His smoking use included cigarettes. He started smoking about 57 years ago. He has a 49.3 pack-year smoking history. He has never used smokeless tobacco. He reports current alcohol use. He reports that he does not use drugs.  Family History  Problem Relation Age of Onset   Diabetes Mother    COPD Mother    Heart attack Father        Unsure    Kidney disease Sister    Alcoholism Brother    Alzheimer's disease Brother    Alcohol abuse Brother    COPD Brother    Alcohol abuse Maternal Grandfather    Colon cancer Neg Hx    Stomach cancer Neg Hx    Rectal cancer Neg Hx    Esophageal cancer Neg Hx  Liver cancer Neg Hx    Pancreatic cancer Neg Hx     Medications: Patient's Medications  New Prescriptions   No medications on file  Previous Medications   ALLOPURINOL (ZYLOPRIM) 300 MG TABLET    TAKE 1 TABLET BY MOUTH DAILY TO PREVENT GOUT   ASCORBIC ACID (VITAMIN C) 1000 MG TABLET    Take 1,000 mg by mouth daily.   ATORVASTATIN (LIPITOR) 40 MG TABLET    Take 1 tab daily for cholesterol.   BRIMONIDINE (ALPHAGAN) 0.2 % OPHTHALMIC SOLUTION    SMARTSIG:In Eye(s)   BUPROPION (WELLBUTRIN XL) 300 MG 24 HR TABLET    Take  1 tablet  Daily for Mood, Focus & Concentration                                         /         TAKE                      BY                      MOUTH   FINASTERIDE (PROSCAR) 5 MG TABLET    TAKE 1 TABLET BY MOUTH DAILY FOR PROSTATE   GABAPENTIN (NEURONTIN) 300 MG CAPSULE    Take  1 capsule  3 x /day for  Pain                                                                                              /                                                                   TAKE                                          BY                                                 MOUTH   GLUCOSE BLOOD (FREESTYLE TEST STRIPS) TEST STRIP    Check blood sugar 3 to 4 times daily for medication regulation.   INSULIN NPH-REGULAR HUMAN (NOVOLIN 70/30) (70-30) 100 UNIT/ML INJECTION    Inject 12-22 Units into the skin in the morning and at bedtime. 22 units in the AM, 12-15 units in the PM.   LATANOPROST (XALATAN) 0.005 % OPHTHALMIC SOLUTION    Place 1 drop into both eyes at bedtime.    MAGNESIUM 500 MG TABS    Take 1 tablet (500 mg total) by  mouth daily.   MODAFINIL (PROVIGIL) 200 MG TABLET    Take  1/2 to 1 tablet  every morning for  Alertness   MYCOPHENOLATE (MYFORTIC) 360 MG TBEC EC TABLET    TAKE 1 TABLET BY MOUTH TWICE DAILY   PANTOPRAZOLE (PROTONIX) 40 MG TABLET    Take  1 tablet  2 x / day   to Prevent Heartburn &  Indigestion                                 /                                                                   TAKE                                         BY                                                 MOUTH   TACROLIMUS (PROGRAF) 0.5 MG CAPSULE    Take 0.5 mg by mouth 2 (two) times daily. With 1mg  capsule for total of 1.5mg  twice daily   TACROLIMUS (PROGRAF) 1 MG CAPSULE    Take 1 mg by mouth 2 (two) times daily. With  0.5g capsule for total of 1.5mg  twice daily   TAMSULOSIN (FLOMAX) 0.4 MG CAPS CAPSULE    Take 0.4 mg by mouth daily.   TRAZODONE (DESYREL) 50 MG TABLET    TAKE ONE TABLET BY MOUTH DAILY ONE HOUR BEFORE BEDTIME AS NEEDED FOR SLEEP   VALSARTAN (DIOVAN) 80 MG TABLET    Take 1 tablet (80 mg total) by mouth daily.   WARFARIN (COUMADIN) 5 MG TABLET    Take  1 tab daily  Modified Medications   No medications on file  Discontinued Medications   No medications on file    Physical Exam:  Vitals:   06/05/23 1021  BP: 124/72  Pulse: 73  Resp: 16  Temp: 97.6 F (36.4 C)  TempSrc: Temporal  SpO2: 95%  Weight: 177 lb 9.6 oz (80.6 kg)  Height: 5\' 7"  (1.702 m)   Body mass  index is 27.82 kg/m. Wt Readings from Last 3 Encounters:  06/05/23 177 lb 9.6 oz (80.6 kg)  05/21/23 173 lb (78.5 kg)  04/23/23 176 lb (79.8 kg)    Physical Exam Vitals reviewed.  Constitutional:      General: He is not in acute distress. HENT:     Head: Normocephalic.     Ears:     Comments: Bilateral hearing aids    Nose: Nose normal.     Mouth/Throat:     Mouth: Mucous membranes are moist.  Eyes:     General:        Right eye: No discharge.        Left eye: No discharge.  Cardiovascular:     Rate and Rhythm: Normal rate. Rhythm irregular.     Pulses: Normal  pulses.     Heart sounds: Murmur heard.  Pulmonary:     Effort: Pulmonary effort is normal.     Breath sounds: Normal breath sounds.  Abdominal:     General: Bowel sounds are normal.     Palpations: Abdomen is soft.  Musculoskeletal:     Cervical back: Neck supple.     Right lower leg: No edema.     Left lower leg: No edema.  Skin:    General: Skin is warm.     Capillary Refill: Capillary refill takes less than 2 seconds.     Comments: Scattered areas of purpura on extremities, overall skin thin and fragile, fistula to right FA  Neurological:     General: No focal deficit present.     Mental Status: He is alert and oriented to person, place, and time.  Psychiatric:        Mood and Affect: Mood normal.     Labs reviewed: Basic Metabolic Panel: Recent Labs    12/02/22 1126 02/18/23 1158 02/25/23 1650 04/08/23 0821 04/23/23 1034  NA 139 138 137  --  138  K 4.8 5.1 5.1  --  4.5  CL 105 104 102  --  104  CO2 29 28 30   --  29  GLUCOSE 95 103* 117*  --  104*  BUN 34* 28* 32*  --  31*  CREATININE 1.27 1.18 1.28 1.10 1.22  CALCIUM 9.5 10.1 10.0  --  9.5  MG 2.4 2.2  --   --  2.2  TSH  --  1.82 1.48  --  2.05   Liver Function Tests: Recent Labs    02/18/23 1158 02/25/23 1650 04/23/23 1034  AST 18 17 21   ALT 11 10 20   BILITOT 1.6* 1.5* 0.9  PROT 6.8 6.8 5.9*   Recent Labs    02/25/23 1650   LIPASE 12  AMYLASE 57   No results for input(s): "AMMONIA" in the last 8760 hours. CBC: Recent Labs    02/18/23 1158 02/25/23 1650 04/23/23 1034  WBC 6.0 6.2 4.4  NEUTROABS 4,584 4,439 3,023  HGB 12.7* 13.0* 12.1*  HCT 39.0 40.3 37.1*  MCV 95.8 96.4 97.1  PLT 155 168 144   Lipid Panel: Recent Labs    07/16/22 0934 12/02/22 1126 04/23/23 1034  CHOL 119 133 130  HDL 61 76 64  LDLCALC 45 47 52  TRIG 46 36 48  CHOLHDL 2.0 1.8 2.0   TSH: Recent Labs    02/18/23 1158 02/25/23 1650 04/23/23 1034  TSH 1.82 1.48 2.05   A1C: Lab Results  Component Value Date   HGBA1C 5.7 (H) 04/23/2023     Assessment/Plan 1. Chronic atrial fibrillation (HCC) - remains on warfarin for clot prevention - check INR today - consider coumadin clinic in future - will reach out for pharmacy support> referral made - warfarin (COUMADIN) 5 MG tablet; Takes 5mg , 6 x a week and 2.5mg  1 x a week.  2. Type 2 diabetes mellitus with stage 2 chronic kidney disease, with long-term current use of insulin (HCC) (Primary) - Recent A1c 5.7 04/23/2023 - some blood sugars in 50's> middle of night and AM - will reduce evening 70/30 to 12 units - reduce AM 70/30 to 20 units and reduce every 2 days until sugars between 70-150 - insulin NPH-regular Human (NOVOLIN 70/30) (70-30) 100 UNIT/ML injection; Inject 20 Units into the skin daily with breakfast AND 12 Units daily with supper. Inject 12-22 Units into the skin  in the morning and at bedtime. 22 units in the AM, 12-15 units in the PM.  3. Anticoagulant long-term use - see above - cont warfarin  - Protime-INR  4. Essential hypertension - controlled - BUN/creat 31/1.22 04/23/2023 - cont valsartan  5. Renal transplant, status post 2013 - followed by [facility] - tacrolimus  6. BPH with obstruction/lower urinary tract symptoms - stable with finasteride and tamsulosin  7. Depression, major, in remission (HCC) - no mood changes - cont  Wellbutrin and Trazodone  8. Coronary artery disease involving coronary bypass graft of native heart without angina pectoris - followed by cardiology - total 130, LDL 52> at goal - cont warfarin and atorvastatin  09. Senile purpura (HCC) - increased risk due to anticoagulation - education given  10. Primary osteoarthritis involving multiple joints - followed by [facility] - bilateral knees and hips most bothersome  - received gel injections - cont gabapentin and tylenol   11. Cirrhosis of liver without ascites, unspecified hepatic cirrhosis type (HCC) - followed by GI - secondary to fatty liver disease - platelets, LFTs stable  12. Primary narcolepsy without cataplexy - cont modafinil  Total time: 62 minutes. Greater than 50% of total time spent doing patient education regarding health maintenance, atrial fibrillation, HTN, T2DM, depression, renal transplant and cirrhosis including symptom/medication management.     Next appt: 07/17/2023  Bettina Gavia  [facility] [phone]

## 2023-06-06 ENCOUNTER — Other Ambulatory Visit: Payer: Self-pay | Admitting: Orthopedic Surgery

## 2023-06-06 ENCOUNTER — Encounter: Payer: Self-pay | Admitting: Orthopedic Surgery

## 2023-06-06 DIAGNOSIS — I482 Chronic atrial fibrillation, unspecified: Secondary | ICD-10-CM

## 2023-06-06 DIAGNOSIS — G47419 Narcolepsy without cataplexy: Secondary | ICD-10-CM

## 2023-06-06 LAB — PROTIME-INR
INR: 1.8 — ABNORMAL HIGH
Prothrombin Time: 18.7 s — ABNORMAL HIGH (ref 9.0–11.5)

## 2023-06-06 MED ORDER — WARFARIN SODIUM 5 MG PO TABS: ORAL_TABLET | ORAL | Status: DC

## 2023-06-08 ENCOUNTER — Other Ambulatory Visit: Payer: Self-pay | Admitting: Orthopedic Surgery

## 2023-06-08 DIAGNOSIS — I482 Chronic atrial fibrillation, unspecified: Secondary | ICD-10-CM

## 2023-06-08 MED ORDER — WARFARIN SODIUM 5 MG PO TABS: ORAL_TABLET | ORAL | Status: AC

## 2023-06-11 ENCOUNTER — Encounter: Payer: Self-pay | Admitting: Pharmacist

## 2023-06-11 ENCOUNTER — Telehealth: Payer: Self-pay | Admitting: Pharmacist

## 2023-06-11 DIAGNOSIS — I480 Paroxysmal atrial fibrillation: Secondary | ICD-10-CM

## 2023-06-11 NOTE — Progress Notes (Signed)
   06/11/2023  Patient ID: Robert Arnold., male   DOB: [birth year], 78 y.o.   MRN: [id number]  error

## 2023-06-11 NOTE — Progress Notes (Signed)
   06/11/2023  Patient ID: Robert Penna., male   DOB: [birth year], 78 y.o.   MRN: [id number]    Reason for referral: Medication Assistance  Referral source:  Hazle Nordmann, NP  Reason for call: Medication assistance -Eliquis  Outreach:  Unsuccessful telephone call attempt #1  to patient.   HIPAA compliant voicemail left requesting a return call  Plan:  -I will make another outreach attempt to patient in 3-4 business days.    Beecher Mcardle, PharmD, BCACP Clinical Pharmacist [phone]

## 2023-06-12 ENCOUNTER — Encounter: Payer: Self-pay | Admitting: Orthopedic Surgery

## 2023-06-17 ENCOUNTER — Other Ambulatory Visit: Payer: Self-pay

## 2023-06-17 DIAGNOSIS — E1169 Type 2 diabetes mellitus with other specified complication: Secondary | ICD-10-CM

## 2023-06-17 MED ORDER — TAMSULOSIN HCL 0.4 MG PO CAPS: 0.4000 mg | ORAL_CAPSULE | Freq: Every day | ORAL | 3 refills | Status: DC

## 2023-06-17 MED ORDER — ATORVASTATIN CALCIUM 40 MG PO TABS: ORAL_TABLET | ORAL | 0 refills | Status: AC

## 2023-06-18 ENCOUNTER — Other Ambulatory Visit: Payer: Self-pay | Admitting: Orthopedic Surgery

## 2023-06-18 ENCOUNTER — Telehealth: Payer: Self-pay | Admitting: Pharmacist

## 2023-06-18 DIAGNOSIS — I480 Paroxysmal atrial fibrillation: Secondary | ICD-10-CM

## 2023-06-18 DIAGNOSIS — G47419 Narcolepsy without cataplexy: Secondary | ICD-10-CM

## 2023-06-18 MED ORDER — MODAFINIL 200 MG PO TABS: ORAL_TABLET | ORAL | 1 refills | Status: DC

## 2023-06-18 NOTE — Addendum Note (Signed)
 Addended by: Maurice Small on: 06/18/2023 10:18 AM   Modules accepted: Orders

## 2023-06-18 NOTE — Telephone Encounter (Signed)
 RX converts to print despite selecting the class as normal for rx to go electronically. I will send to the PCP to see if she can send electronically

## 2023-06-18 NOTE — Progress Notes (Signed)
 06/18/2023 Name: Robert Arnold. MRN: [id number] DOB: Jun 24, 1945  Chief Complaint  Patient presents with   Medication Assistance    Eliquis    Robert Arnold. is a 78 y.o. year old male who presented for a telephone visit.   They were referred to the pharmacist by their PCP for assistance in managing medication access.    Subjective:  Care Team: Primary Care ProvideMedication Access/Adherence  Current Pharmacy:  [facility] [id number] [place], [place] - [address] [place] [address] Phone: [phone] Fax: [phone]   Patient reports affordability concerns with their medications: Yes  Eliquis--is now on Warfarin Patient reports access/transportation concerns to their pharmacy: No  Patient reports adherence concerns with their medications:  No      Spoke with Patient and completed a medication review.  Reviewed his Medicare Part D Plan.  He has Medical illustrator Plan.  He has a $573 deductible.  Generic medications are $0 but preferred brand medications have a 20% coinsurance (meaning patient has to pay 20% of the cost of the medication until he reaches $2000).  Patient was not interested in trying to go through Patient Assistance because he said he and his wife tried it last year and they did not qualify.  Even if he did qualify financially, He has not met the medication spend requirement of 3% of his household income to be approved.  A1c 5.7%  Objective:  Lab Results  Component Value Date   HGBA1C 5.7 (H) 04/23/2023    Lab Results  Component Value Date   CREATININE 1.22 04/23/2023   BUN 31 (H) 04/23/2023   NA 138 04/23/2023   K 4.5 04/23/2023   CL 104 04/23/2023   CO2 29 04/23/2023    Lab Results  Component Value Date   CHOL 130 04/23/2023   HDL 64 04/23/2023   LDLCALC 52 04/23/2023   TRIG 48 04/23/2023   CHOLHDL 2.0 04/23/2023    Medications Reviewed Today     Reviewed by Robert Arnold, [facility] (Pharmacist) on  06/18/23 at [phone]  Med List Status: <None>   Medication Order Taking? Sig Documenting Provider Last Dose Status Informant  allopurinol (ZYLOPRIM) 300 MG tablet [id number] Yes TAKE 1 TABLET BY MOUTH DAILY TO PREVENT GOUT Raynelle Dick, NP Taking Active   Ascorbic Acid (VITAMIN C) 1000 MG tablet [id number] Yes Take 1,000 mg by mouth daily. [provider] Taking Active   atorvastatin (LIPITOR) 40 MG tablet [id number] Yes Take 1 tab daily for cholesterol. Worthy Rancher B, FNP Taking Active   brimonidine [facility]) 0.2 % ophthalmic solution [id number] Yes SMARTSIG:In Eye(s) [provider] Taking Active   buPROPion (WELLBUTRIN XL) 300 MG 24 hr tablet [id number] Yes Take  1 tablet  Daily for Mood, Focus & Concentration                                         /         TAKE                      BY                      MOUTH [name], MD Taking Active   finasteride (PROSCAR) 5 MG tablet [id number] Yes TAKE 1 TABLET BY MOUTH DAILY FOR PROSTATE Aundria Rud,  Rosalva Ferron, NP Taking Active   gabapentin (NEURONTIN) 300 MG capsule [id number] Yes Take  1 capsule  3 x /day for  Pain                                                                                              /                                                                   TAKE                                         BY                                                 MOUTH  Patient taking differently: Take 300 mg by mouth at bedtime. Take  1 capsule  3 x /day for  Pain                                                                                              /                                                                   TAKE                                         BY                                                 MOUTH   [name], MD Taking Active   glucose blood (FREESTYLE TEST STRIPS) test strip [id number]  Check blood sugar 3 to 4 times daily for medication regulation. [name], MD  Active Self  insulin  NPH-regular Human (NOVOLIN 70/30) (70-30) 100 UNIT/ML injection [id number] Yes Inject 20 Units  into the skin daily with breakfast AND 12 Units daily with supper. Inject 12-22 Units into the skin in the morning and at bedtime. 22 units in the AM, 12-15 units in the PM.. Fargo, Amy E, NP Taking Active   latanoprost (XALATAN) 0.005 % ophthalmic solution [id number] Yes Place 1 drop into both eyes at bedtime.  [provider] Taking Active Self  Magnesium 500 MG TABS [id number] Yes Take 1 tablet (500 mg total) by mouth daily. Judd Gaudier, NP Taking Active   modafinil (PROVIGIL) 200 MG tablet [id number] Yes Take  1/2 to 1 tablet  every morning for  Alertness [name], MD Taking Active   mycophenolate (MYFORTIC) 360 MG TBEC EC tablet [id number] Yes TAKE 1 TABLET BY MOUTH TWICE DAILY [name], MD Taking Active   tacrolimus (PROGRAF) 0.5 MG capsule [id number] Yes Take 0.5 mg by mouth 2 (two) times daily. With 1mg  capsule for total of 1.5mg  twice daily [provider] Taking Active Self  tacrolimus (PROGRAF) 1 MG capsule [id number] Yes Take 1 mg by mouth 2 (two) times daily. With  0.5g capsule for total of 1.5mg  twice daily [provider] Taking Active Self  tamsulosin (FLOMAX) 0.4 MG CAPS capsule [id number]  Take 1 capsule (0.4 mg total) by mouth daily. Worthy Rancher B, FNP  Active   traZODone (DESYREL) 50 MG tablet [id number] Yes TAKE ONE TABLET BY MOUTH DAILY ONE HOUR BEFORE BEDTIME AS NEEDED FOR SLEEP Cranford, Tonya, NP Taking Active   valsartan (DIOVAN) 80 MG tablet [id number] Yes Take 1 tablet (80 mg total) by mouth daily. Nahser, Deloris Ping, MD Taking Active   warfarin (COUMADIN) 5 MG tablet [id number] Yes Takes 5mg  by mouth daily Monday- Saturday, reduce dose to 2.5 mg every sunday Fargo, Amy E, NP Taking Active               Assessment/Plan:   Patient said he will remain on warfarin due to the cost of Eliquis.  Follow Up Plan:    Route note to  PCP.  Robert Arnold, PharmD, BCACP Clinical Pharmacist [phone]

## 2023-06-23 ENCOUNTER — Other Ambulatory Visit: Payer: Self-pay | Admitting: Orthopedic Surgery

## 2023-06-23 DIAGNOSIS — N138 Other obstructive and reflux uropathy: Secondary | ICD-10-CM

## 2023-06-23 DIAGNOSIS — G47419 Narcolepsy without cataplexy: Secondary | ICD-10-CM

## 2023-06-23 MED ORDER — TAMSULOSIN HCL 0.4 MG PO CAPS: 0.4000 mg | ORAL_CAPSULE | Freq: Every day | ORAL | 2 refills | Status: AC

## 2023-06-23 MED ORDER — MODAFINIL 200 MG PO TABS: 200.0000 mg | ORAL_TABLET | Freq: Every day | ORAL | 1 refills | Status: AC

## 2023-06-23 NOTE — Telephone Encounter (Signed)
Message routed to PCP Fargo, Amy E, NP  

## 2023-06-24 ENCOUNTER — Encounter: Payer: Self-pay | Admitting: Orthopedic Surgery

## 2023-06-25 ENCOUNTER — Encounter: Payer: Self-pay | Admitting: Orthopedic Surgery

## 2023-06-25 ENCOUNTER — Ambulatory Visit (INDEPENDENT_AMBULATORY_CARE_PROVIDER_SITE_OTHER): Admitting: Sports Medicine

## 2023-06-25 VITALS — BP 124/87 | HR 61 | Temp 96.7°F | Resp 18 | Ht 67.0 in | Wt 177.2 lb

## 2023-06-25 DIAGNOSIS — R0981 Nasal congestion: Secondary | ICD-10-CM | POA: Diagnosis not present

## 2023-06-25 DIAGNOSIS — R051 Acute cough: Secondary | ICD-10-CM

## 2023-06-25 LAB — POCT INFLUENZA A/B
Influenza A, POC: NEGATIVE
Influenza B, POC: NEGATIVE

## 2023-06-25 LAB — POCT RAPID STREP A (OFFICE): Rapid Strep A Screen: NEGATIVE

## 2023-06-25 LAB — POC COVID19 BINAXNOW: SARS Coronavirus 2 Ag: POSITIVE — AB

## 2023-06-25 NOTE — Patient Instructions (Signed)
 Take tylenol prn for fever, headache Take otc cough medicine  Take Cepacol lozenges for sore throat Monitor for Sob and breathing gets worse needs to go to ED.

## 2023-06-25 NOTE — Progress Notes (Signed)
 Careteam: Patient Care Team: Octavia Heir, NP as PCP - General (Adult Health Nurse Practitioner) Nahser, Deloris Ping, MD as PCP - Cardiology (Cardiology) Fredrich Birks, OD as Referring Physician (Optometry) Bufford Buttner, MD as Consulting Physician (Nephrology) Armbruster, Willaim Rayas, MD as Consulting Physician (Gastroenterology) Aris Lot, MD as Consulting Physician (Dermatology)  PLACE OF SERVICE:  [facility]  Advanced Directive information    Allergies  Allergen Reactions   Losartan Anaphylaxis   Crestor [Rosuvastatin] Other (See Comments)    Elevated LFT's   Lorazepam     Pt is unsure of reaction    Morphine And Codeine Other (See Comments)    Has no effect on pt     Chief Complaint  Patient presents with   Cough     pt sent mychart message stating last night started feeling a scratchy throat and cough     Discussed the use of AI scribe software for clinical note transcription with the patient, who gave verbal consent to proceed.  History of Present Illness   Robert Kloepfer. is a 78 year old male with a history of renal transplant who presents with symptoms of COVID-19.  He has been experiencing a scratchy throat and congestion that began last night, with symptoms worsening by this morning. He denies fever but has a cough with a small amount of   phlegm. Shortness of breath has been present over the past few months but no recent change. He also reports body aches, stomach discomfort, and nausea without vomiting. His appetite is reduced, but he is able to keep food down. No dizziness, lightheadedness, headaches, ear pain, or significant sinus pain, although there is some discomfort in the sinus areas. His throat is scratchy but not painful when swallowing.  He has a history of a renal transplant and is currently on tacrolimus and mycophenolate. He is also on coumadin. He uses a humidifier and has recently started taking Zyrtec for allergies. He denies recent  exposure to anyone known to be sick but is unsure if he has been around anyone who might have been ill.         Review of Systems:  Review of Systems  Constitutional:  Positive for malaise/fatigue. Negative for chills and fever.  HENT:  Positive for congestion and sore throat. Negative for ear pain and sinus pain.   Eyes:  Negative for double vision.  Respiratory:  Positive for cough and shortness of breath (EXERTIONAL, NO RECENT CHANGE). Negative for sputum production.   Cardiovascular:  Negative for chest pain, palpitations and leg swelling.  Gastrointestinal:  Positive for nausea. Negative for abdominal pain and heartburn.  Genitourinary:  Negative for dysuria, frequency and hematuria.  Musculoskeletal:  Positive for myalgias.  Neurological:  Negative for dizziness.   Negative unless indicated in HPI.   Past Medical History:  Diagnosis Date   Adenomatous colon polyp    Allergy    BPH (benign prostatic hyperplasia)    CAD (coronary artery disease)    a. NSTEMI 09/2011 s/p PCI to LAD. b. CABG 01/2012 @ [facility].   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 for prophylaxis  Ischemic cardiomyopathy    Mild aortic stenosis    Mild dilation of ascending aorta (HCC)    NSTEMI (non-ST elevated myocardial infarction) (HCC) 11/26/2013   2013    OSA (obstructive sleep apnea)    PAF (paroxysmal atrial fibrillation) (HCC)    Testosterone deficiency 02/27/2018   Trifascicular block    Past Surgical History:  Procedure Laterality Date   ANTERIOR CERVICAL DECOMP/DISCECTOMY FUSION N/A 05/04/2018   Procedure: Cervical Five-Six Anterior cervical decompression/discectomy/fusion;  Surgeon: Shirlean Kelly, MD;  Location: [facility]  OR;  Service: Neurosurgery;  Laterality: N/A;  Cervical Five-Six Anterior cervical decompression/discectomy/fusion   APPENDECTOMY     AV FISTULA PLACEMENT  2011   Left forearm   COLONOSCOPY     CORONARY ANGIOPLASTY WITH STENT PLACEMENT     CORONARY ARTERY BYPASS GRAFT  2013   KIDNEY TRANSPLANT  09/25/2011   LAPAROSCOPIC APPENDECTOMY N/A 04/06/2021   Procedure: APPENDECTOMY LAPAROSCOPIC;  Surgeon: Emelia Loron, MD;  Location: [facility] OR;  Service: General;  Laterality: N/A;   PERITONEAL CATHETER INSERTION  2011   Social History:   reports that he quit smoking about 32 years ago. His smoking use included cigarettes. He started smoking about 57 years ago. He has a 49.3 pack-year smoking history. He has never used smokeless tobacco. He reports current alcohol use. He reports that he does not use drugs.  Family History  Problem Relation Age of Onset   Diabetes Mother    COPD Mother    Heart attack Father        Unsure    COPD Father    Kidney disease Sister    Alcoholism Brother    Alzheimer's disease Brother    Alcohol abuse Brother    COPD Brother    Alcohol abuse Maternal Grandfather    Colon cancer Neg Hx    Stomach cancer Neg Hx    Rectal cancer Neg Hx    Esophageal cancer Neg Hx    Liver cancer Neg Hx    Pancreatic cancer Neg Hx     Medications: Patient's Medications  New Prescriptions   No medications on file  Previous Medications   ALLOPURINOL (ZYLOPRIM) 300 MG TABLET    TAKE 1 TABLET BY MOUTH DAILY TO PREVENT GOUT   ASCORBIC ACID (VITAMIN C) 1000 MG TABLET    Take 1,000 mg by mouth daily.   ATORVASTATIN (LIPITOR) 40 MG TABLET    Take 1 tab daily for cholesterol.   BRIMONIDINE (ALPHAGAN) 0.2 % OPHTHALMIC SOLUTION    SMARTSIG:In Eye(s)   BUPROPION (WELLBUTRIN XL) 300 MG 24 HR TABLET    Take  1 tablet  Daily for Mood, Focus & Concentration                                         /         TAKE                      BY                      MOUTH   FINASTERIDE (PROSCAR) 5  MG TABLET    TAKE 1 TABLET BY MOUTH DAILY FOR PROSTATE   GABAPENTIN (NEURONTIN) 300 MG CAPSULE    Take  1 capsule  3 x /day for  Pain                                                                                              /  TAKE                                         BY                                                 MOUTH   GLUCOSE BLOOD (FREESTYLE TEST STRIPS) TEST STRIP    Check blood sugar 3 to 4 times daily for medication regulation.   INSULIN NPH-REGULAR HUMAN (NOVOLIN 70/30) (70-30) 100 UNIT/ML INJECTION    Inject 20 Units into the skin daily with breakfast AND 12 Units daily with supper. Inject 12-22 Units into the skin in the morning and at bedtime. 22 units in the AM, 12-15 units in the PM..   LATANOPROST (XALATAN) 0.005 % OPHTHALMIC SOLUTION    Place 1 drop into both eyes at bedtime.    MAGNESIUM 500 MG TABS    Take 1 tablet (500 mg total) by mouth daily.   MODAFINIL (PROVIGIL) 200 MG TABLET    Take 1 tablet (200 mg total) by mouth daily.   MYCOPHENOLATE (MYFORTIC) 360 MG TBEC EC TABLET    TAKE 1 TABLET BY MOUTH TWICE DAILY   TACROLIMUS (PROGRAF) 0.5 MG CAPSULE    Take 0.5 mg by mouth 2 (two) times daily. With 1mg  capsule for total of 1.5mg  twice daily   TACROLIMUS (PROGRAF) 1 MG CAPSULE    Take 1 mg by mouth 2 (two) times daily. With  0.5g capsule for total of 1.5mg  twice daily   TAMSULOSIN (FLOMAX) 0.4 MG CAPS CAPSULE    Take 1 capsule (0.4 mg total) by mouth daily.   TRAZODONE (DESYREL) 50 MG TABLET    TAKE ONE TABLET BY MOUTH DAILY ONE HOUR BEFORE BEDTIME AS NEEDED FOR SLEEP   VALSARTAN (DIOVAN) 80 MG TABLET    Take 1 tablet (80 mg total) by mouth daily.   WARFARIN (COUMADIN) 5 MG TABLET    Takes 5mg  by mouth daily Monday- Saturday, reduce dose to 2.5 mg every sunday  Modified Medications   No medications on file  Discontinued Medications   No medications on file    Physical Exam: Vitals:   06/25/23 1341   BP: 124/87  Pulse: 61  Resp: 18  Temp: (!) 96.7 F (35.9 C)  SpO2: 97%  Weight: 177 lb 3.2 oz (80.4 kg)  Height: 5\' 7"  (1.702 m)   Body mass index is 27.75 kg/m. BP Readings from Last 3 Encounters:  06/25/23 124/87  06/05/23 124/72  05/21/23 106/70   Wt Readings from Last 3 Encounters:  06/25/23 177 lb 3.2 oz (80.4 kg)  06/05/23 177 lb 9.6 oz (80.6 kg)  05/21/23 173 lb (78.5 kg)    Physical Exam Constitutional:      Appearance: Normal appearance.  HENT:     Head: Normocephalic and atraumatic.  Cardiovascular:     Rate and Rhythm: Normal rate and regular rhythm.     Pulses: Normal pulses.     Heart sounds: Normal heart sounds.  Pulmonary:     Effort: No respiratory distress.     Breath sounds: No stridor. No wheezing or rales.  Abdominal:     General: Bowel sounds are normal. There is no distension.     Palpations: Abdomen is soft.  Tenderness: There is no abdominal tenderness. There is no guarding.  Musculoskeletal:        General: No swelling.  Neurological:     Mental Status: He is alert. Mental status is at baseline.     Labs reviewed: Basic Metabolic Panel: Recent Labs    12/02/22 1126 02/18/23 1158 02/25/23 1650 04/08/23 0821 04/23/23 1034  NA 139 138 137  --  138  K 4.8 5.1 5.1  --  4.5  CL 105 104 102  --  104  CO2 29 28 30   --  29  GLUCOSE 95 103* 117*  --  104*  BUN 34* 28* 32*  --  31*  CREATININE 1.27 1.18 1.28 1.10 1.22  CALCIUM 9.5 10.1 10.0  --  9.5  MG 2.4 2.2  --   --  2.2  TSH  --  1.82 1.48  --  2.05   Liver Function Tests: Recent Labs    02/18/23 1158 02/25/23 1650 04/23/23 1034  AST 18 17 21   ALT 11 10 20   BILITOT 1.6* 1.5* 0.9  PROT 6.8 6.8 5.9*   Recent Labs    02/25/23 1650  LIPASE 12  AMYLASE 57   No results for input(s): "AMMONIA" in the last 8760 hours. CBC: Recent Labs    02/18/23 1158 02/25/23 1650 04/23/23 1034  WBC 6.0 6.2 4.4  NEUTROABS 4,584 4,439 3,023  HGB 12.7* 13.0* 12.1*  HCT 39.0  40.3 37.1*  MCV 95.8 96.4 97.1  PLT 155 168 144   Lipid Panel: Recent Labs    07/16/22 0934 12/02/22 1126 04/23/23 1034  CHOL 119 133 130  HDL 61 76 64  LDLCALC 45 47 52  TRIG 46 36 48  CHOLHDL 2.0 1.8 2.0   TSH: Recent Labs    02/18/23 1158 02/25/23 1650 04/23/23 1034  TSH 1.82 1.48 2.05   A1C: Lab Results  Component Value Date   HGBA1C 5.7 (H) 04/23/2023    Assessment and Plan    COVID-19 infection  COVID-19 +    Paxlovid not prescribed due to interactions with tacrolimus and mycophenolate. Advised symptomatic treatment and monitoring for worsening symptoms. - Symptomatic treatment with acetaminophen for fever, over-the-counter cough medicine, and Cepacol lozenges for sore throat. - Recommend Flonase nasal spray or antihistamines for secretion clearance. - Instruct to monitor for worsening symptoms, such as increased dyspnea or oxygen levels dropping below 90%, and seek hospital care if these occur.  Sinus congestion Sinus congestion with mild pain, possibly causing headaches. Current use of cetirizine and humidifier may alleviate symptoms. - Continue cetirizine and humidifier use. - Suggest Flonase nasal spray for sinus congestion relief.         Return follow up with PCP.:

## 2023-06-26 ENCOUNTER — Ambulatory Visit: Payer: Medicare Other | Admitting: Nurse Practitioner

## 2023-07-15 ENCOUNTER — Encounter: Payer: Self-pay | Admitting: Orthopedic Surgery

## 2023-07-15 MED ORDER — TRAZODONE HCL 50 MG PO TABS: ORAL_TABLET | ORAL | 3 refills | Status: AC

## 2023-07-16 ENCOUNTER — Encounter: Payer: Medicare Other | Admitting: Internal Medicine

## 2023-07-16 ENCOUNTER — Encounter: Payer: Medicare Other | Admitting: Nurse Practitioner

## 2023-07-17 ENCOUNTER — Encounter: Payer: Self-pay | Admitting: Gastroenterology

## 2023-07-17 ENCOUNTER — Ambulatory Visit: Admitting: Gastroenterology

## 2023-07-17 ENCOUNTER — Encounter: Payer: Self-pay | Admitting: Orthopedic Surgery

## 2023-07-17 ENCOUNTER — Ambulatory Visit (INDEPENDENT_AMBULATORY_CARE_PROVIDER_SITE_OTHER): Payer: Medicare Other | Admitting: Orthopedic Surgery

## 2023-07-17 ENCOUNTER — Other Ambulatory Visit

## 2023-07-17 VITALS — BP 128/60 | HR 68 | Ht 68.0 in | Wt 176.0 lb

## 2023-07-17 VITALS — BP 102/60 | HR 60 | Temp 97.7°F | Resp 16 | Ht 67.0 in | Wt 178.2 lb

## 2023-07-17 DIAGNOSIS — E1122 Type 2 diabetes mellitus with diabetic chronic kidney disease: Secondary | ICD-10-CM

## 2023-07-17 DIAGNOSIS — I482 Chronic atrial fibrillation, unspecified: Secondary | ICD-10-CM

## 2023-07-17 DIAGNOSIS — I480 Paroxysmal atrial fibrillation: Secondary | ICD-10-CM | POA: Diagnosis not present

## 2023-07-17 DIAGNOSIS — Z794 Long term (current) use of insulin: Secondary | ICD-10-CM

## 2023-07-17 DIAGNOSIS — Z7901 Long term (current) use of anticoagulants: Secondary | ICD-10-CM

## 2023-07-17 DIAGNOSIS — K7460 Unspecified cirrhosis of liver: Secondary | ICD-10-CM | POA: Diagnosis not present

## 2023-07-17 DIAGNOSIS — I2510 Atherosclerotic heart disease of native coronary artery without angina pectoris: Secondary | ICD-10-CM | POA: Diagnosis not present

## 2023-07-17 DIAGNOSIS — N182 Chronic kidney disease, stage 2 (mild): Secondary | ICD-10-CM

## 2023-07-17 DIAGNOSIS — I10 Essential (primary) hypertension: Secondary | ICD-10-CM | POA: Diagnosis not present

## 2023-07-17 DIAGNOSIS — I5042 Chronic combined systolic (congestive) and diastolic (congestive) heart failure: Secondary | ICD-10-CM

## 2023-07-17 DIAGNOSIS — Z940 Kidney transplant status: Secondary | ICD-10-CM

## 2023-07-17 DIAGNOSIS — Z87891 Personal history of nicotine dependence: Secondary | ICD-10-CM

## 2023-07-17 MED ORDER — NOVOLIN 70/30 (70-30) 100 UNIT/ML ~~LOC~~ SUSP: SUBCUTANEOUS | Status: DC

## 2023-07-17 NOTE — Patient Instructions (Signed)
 Remember ice and pressure if you have a cut/abrasion  Flush right ear in shower  Schedule lab visit in June 2025 to recheck A1c  If PT/INR not at goal we will refer to Coumadin/Warfarin clinic

## 2023-07-17 NOTE — Progress Notes (Signed)
 Agree with assessment and plan as outlined.  He has cirrhosis with a platelet count less than 150.  It has fluctuated to low 100s in recent years.  Given he is on anticoagulation with Coumadin, if you think he can tolerate anesthesia, I think reasonable to do an EGD to screen for varices.  I think it would be good to have Robert Arnold review his chart to see if cleared for anesthesia in the [facility], his EF has been above 35% on nuclear stress and on echo but want to get his opinion before we put him on the schedule.  He is status post renal transplant as well.  Robert Arnold can you take a look at this patient's chart and help [place] determine if he is appropriate for the [facility] or would you prefer him to be done at the hospital?  Thanks  Otherwise, agree with serologic workup otherwise for this.

## 2023-07-17 NOTE — Progress Notes (Signed)
 Chief Complaint: Primary GI MD:  HPI: Robert Arnold is a 78 year old male with a past medical history as listed below including CAD, ESRD and multiple others, who presents to clinic today for follow-up of his newly diagnosed cirrhosis.    01/31/2021 colonoscopy for abnormal CT of the GI tract with 1 diminutive polyp in the proximal transverse colon, diverticulosis in the sigmoid colon and internal hemorrhoids as well as superficial mild rectal erythema with suspected bowel prep artifact.  At that time patient was [facility] for appendectomy.    02/25/2023 CMP with a BUN of 32, total bili 1.5.  CBC with a hemoglobin of 13 (around patient's baseline).    02/26/2023 abdominal ultrasound with cholelithiasis but no evidence of acute cholecystitis or bile duct obstruction, nodular liver contour suspicious for cirrhosis, right lower quadrant renal transplant with unremarkable ultrasound appearance.    03/04/2023 HIDA scan for recurrent gallbladder dyskinesia with patent cystic duct and normal ejection fraction.    03/31/2023 patient seen for abdominal pain.  At that time completely better on his abdominal pain and weight loss and had been gaining weight over the past 2 to 3 weeks.  Ultrasound did show question of cirrhosis which was new for him.  Ordered a CT for further eval.    04/08/2023 CT abdomen pelvis showed moderate cirrhosis without hepatocellular carcinoma portal venous hypertension, right iliac fossa renal transplant without complication and cholelithiasis with bilateral nephrolithiasis.    04/23/2023 CBC with normal platelets.  CMP with normal LFTs.  INR 1.8 but patient is on Coumadin.      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 no evidence of portal hypertension.  LFTs normal. INR out of range  on coumadin.  Ordered liver labs to rule out autoimmune disorders with cemented problem to be completed at lab work (we do not  have these test available).    Dr. Adela Lank reviewed case and recommended appt with him to discuss possible EGD. Though high risk given EF 38% and likely low yield with normal platelets and CT without signs of portal hypertension/varices.  MELD 3.0: 23 April 2023  Patient presents today with his wife and has no issues.  No complaints.  Doing well.  Denies yellowing of skin or eyes, fluid buildup in legs or belly, confusion.  He did not have labs drawn at last visit to rule out autoimmune/genetic causes of cirrhosis   Past Medical History:  Diagnosis Date   Adenomatous colon polyp    Allergy    BPH (benign prostatic hyperplasia)    CAD (coronary artery disease)    a. NSTEMI 09/2011 s/p PCI to LAD. b. CABG 01/2012 @ [facility].   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   Trifascicular block     Past Surgical  History:  Procedure Laterality Date   ANTERIOR CERVICAL DECOMP/DISCECTOMY FUSION N/A 05/04/2018   Procedure: Cervical Five-Six Anterior cervical decompression/discectomy/fusion;  Surgeon: Shirlean Kelly, MD;  Location: [facility] OR;  Service: Neurosurgery;  Laterality: N/A;  Cervical Five-Six Anterior cervical decompression/discectomy/fusion   APPENDECTOMY     AV FISTULA PLACEMENT  2011   Left forearm   COLONOSCOPY     CORONARY ANGIOPLASTY WITH STENT PLACEMENT     CORONARY ARTERY BYPASS GRAFT  2013   KIDNEY  TRANSPLANT  09/25/2011   LAPAROSCOPIC APPENDECTOMY N/A 04/06/2021   Procedure: APPENDECTOMY LAPAROSCOPIC;  Surgeon: Emelia Loron, MD;  Location: [facility] OR;  Service: General;  Laterality: N/A;   PERITONEAL CATHETER INSERTION  2011    Current Outpatient Medications  Medication Sig Dispense Refill   allopurinol (ZYLOPRIM) 300 MG tablet TAKE 1 TABLET BY MOUTH DAILY TO PREVENT GOUT 90 tablet 3   Ascorbic Acid (VITAMIN C) 1000 MG tablet Take 1,000 mg by mouth daily.     atorvastatin (LIPITOR) 40 MG tablet Take 1 tab daily for cholesterol. 90 tablet 0   brimonidine (ALPHAGAN) 0.2 % ophthalmic solution SMARTSIG:In Eye(s)     buPROPion (WELLBUTRIN XL) 300 MG 24 hr tablet Take  1 tablet  Daily for Mood, Focus & Concentration                                         /         TAKE                      BY                      MOUTH 90 tablet 3   Cyanocobalamin (VITAMIN B 12 PO) Take 1 tablet by mouth daily.     Cyanocobalamin (VITAMIN B12 PO) Take 1 tablet by mouth daily.     finasteride (PROSCAR) 5 MG tablet TAKE 1 TABLET BY MOUTH DAILY FOR PROSTATE 90 tablet 3   gabapentin (NEURONTIN) 300 MG capsule Take  1 capsule  3 x /day for  Pain                                                                                              /                                                                   TAKE                                         BY  MOUTH (Patient taking differently: Take 300 mg by mouth at bedtime. Take  1 capsule  3 x /day for  Pain                                                                                              /                                                                   TAKE                                         BY                                                 MOUTH) 270 capsule 3   glucose blood (FREESTYLE TEST STRIPS) test strip Check blood sugar 3 to 4 times daily for medication regulation. 450 each PRN   insulin  NPH-regular Human (NOVOLIN 70/30) (70-30) 100 UNIT/ML injection Inject 18 Units into the skin daily with breakfast AND 10 Units daily with supper.     latanoprost (XALATAN) 0.005 % ophthalmic solution Place 1 drop into both eyes at bedtime.   4   Magnesium 500 MG TABS Take 1 tablet (500 mg total) by mouth daily. 30 tablet    modafinil (PROVIGIL) 200 MG tablet Take 1 tablet (200 mg total) by mouth daily. 90 tablet 1   mycophenolate (MYFORTIC) 360 MG TBEC EC tablet TAKE 1 TABLET BY MOUTH TWICE DAILY 180 tablet 1   tacrolimus (PROGRAF) 0.5 MG capsule Take 0.5 mg by mouth 2 (two) times daily. With 1mg  capsule for total of 1.5mg  twice daily     tacrolimus (PROGRAF) 1 MG capsule Take 1 mg by mouth 2 (two) times daily. With  0.5g capsule for total of 1.5mg  twice daily     tamsulosin (FLOMAX) 0.4 MG CAPS capsule Take 1 capsule (0.4 mg total) by mouth daily. 90 capsule 2   traZODone (DESYREL) 50 MG tablet TAKE ONE TABLET BY MOUTH DAILY ONE HOUR BEFORE BEDTIME AS NEEDED FOR SLEEP 90 tablet 3   valsartan (DIOVAN) 80 MG tablet Take 1 tablet (80 mg total) by mouth daily. 90 tablet 3   warfarin (COUMADIN) 5 MG tablet Takes 5mg  by mouth daily Monday- Saturday, reduce dose to 2.5 mg every sunday     No current facility-administered medications for this visit.    Allergies as of 07/17/2023 - Review Complete 07/17/2023  Allergen Reaction Noted   Losartan Anaphylaxis 04/16/2012   Crestor [rosuvastatin] Other (See Comments) 03/06/2013   Lorazepam  02/25/2014   Morphine and codeine Other (See Comments) 04/28/2018    Family History  Problem Relation Age of Onset   Diabetes Mother    COPD Mother    Heart attack Father        Unsure    COPD Father    Kidney disease Sister    Alcoholism Brother    Alzheimer's disease Brother    Alcohol abuse Brother    COPD Brother    Alcohol abuse Maternal Grandfather    Colon cancer Neg Hx    Stomach cancer Neg Hx    Rectal cancer Neg Hx    Esophageal cancer Neg Hx     Liver cancer Neg Hx    Pancreatic cancer Neg Hx     Social History   Socioeconomic History   Marital status: Married    Spouse name: Not on file   Number of children: 1   Years of education: Not on file   Highest education level: Some college, no degree  Occupational History   Occupation: retired  Tobacco Use   Smoking status: Former    Current packs/day: 0.00    Average packs/day: 2.0 packs/day for 24.6 years (49.3 ttl pk-yrs)    Types: Cigarettes    Start date: 1968    Quit date: 11/29/1990    Years since quitting: 32.6   Smokeless tobacco: Never  Vaping Use   Vaping status: Never Used  Substance and Sexual Activity   Alcohol use: Yes    Comment: rarely   Drug use: No   Sexual activity: Not on file  Other Topics Concern   Not on file  Social History Narrative   Tobacco use, amount per day now: None   Past tobacco use, amount per day: 2 packs of cigarettes   How many years did you use tobacco: 20 years    Alcohol use (drinks per week): None   Diet: None   Do you drink/eat things with caffeine: Yes coffee   Marital status:  Married                                What year were you married? 1976   Do you live in a house, apartment, assisted living, condo, trailer, etc.? House   Is it one or more stories? 1 story    How many persons live in your home? 2   Do you have pets in your home?( please list) 1 cat   Highest Level of education completed? 2 years of college   Current or past profession: Sales    Do you exercise?  No                                Type and how often? No   Do you have a living will? Yes   Do you have a DNR form? Yes                                  If not, do you want to discuss one?   Do you have signed POA/HPOA forms? Yes                        If so, please bring to you appointment      Do you have any difficulty bathing or dressing yourself? No   Do you have any difficulty preparing food  or eating? No   Do you have any difficulty managing  your medications? No   Do you have any difficulty managing your finances? No   Do you have any difficulty affording your medications? No   Social Drivers of Corporate investment banker Strain: Low Risk  (06/25/2023)   Overall Financial Resource Strain (CARDIA)    Difficulty of Paying Living Expenses: Not hard at all  Food Insecurity: No Food Insecurity (06/25/2023)   Hunger Vital Sign    Worried About Running Out of Food in the Last Year: Never true    Ran Out of Food in the Last Year: Never true  Transportation Needs: No Transportation Needs (06/25/2023)   PRAPARE - Administrator, Civil Service (Medical): No    Lack of Transportation (Non-Medical): No  Physical Activity: Unknown (06/25/2023)   Exercise Vital Sign    Days of Exercise per Week: 0 days    Minutes of Exercise per Session: Not on file  Stress: Stress Concern Present (06/25/2023)   [employer] of Occupational Health - Occupational Stress Questionnaire    Feeling of Stress : To some extent  Social Connections: Unknown (06/25/2023)   Social Connection and Isolation Panel [NHANES]    Frequency of Communication with Friends and Family: Once a week    Frequency of Social Gatherings with Friends and Family: Patient declined    Attends Religious Services: More than 4 times per year    Active Member of [employer] or Organizations: Yes    Attends Engineer, structural: More than 4 times per year    Marital Status: Married  Catering manager Violence: Not At Risk (07/17/2023)   Humiliation, Afraid, Rape, and Kick questionnaire    Fear of Current or Ex-Partner: No    Emotionally Abused: No    Physically Abused: No    Sexually Abused: No    Review of Systems:    Constitutional: No weight loss, fever, chills, weakness or fatigue HEENT: Eyes: No change in vision               Ears, Nose, Throat:  No change in hearing or congestion Skin: No rash or itching Cardiovascular: No chest pain, chest pressure or  palpitations   Respiratory: No SOB or cough Gastrointestinal: See HPI and otherwise negative Genitourinary: No dysuria or change in urinary frequency Neurological: No headache, dizziness or syncope Musculoskeletal: No new muscle or joint pain Hematologic: No bleeding or bruising Psychiatric: No history of depression or anxiety    Physical Exam:  Vital signs: BP 128/60   Pulse 68   Ht 5\' 8"  (1.727 m)   Wt 176 lb (79.8 kg)   BMI 26.76 kg/m   Constitutional: NAD, Well developed, Well nourished, alert and cooperative Head:  Normocephalic and atraumatic. Eyes:   PEERL, EOMI. No icterus. Conjunctiva pink. Respiratory: Respirations even and unlabored. Lungs clear to auscultation bilaterally.   No wheezes, crackles, or rhonchi.  Cardiovascular:  Regular rate and rhythm. No peripheral edema, cyanosis or pallor.  Gastrointestinal:  Soft, nondistended, nontender. No rebound or guarding. Normal bowel sounds. No appreciable masses or hepatomegaly. Rectal:  Not performed.  Msk:  Symmetrical without gross deformities. Without edema, no deformity or joint abnormality.  Neurologic:  Alert and  oriented x4;  grossly normal neurologically.  Skin:   Dry and intact without significant lesions or rashes. Psychiatric: Oriented to person, place and time. Demonstrates good judgement and reason without abnormal affect or behaviors.  RELEVANT LABS AND IMAGING: CBC  Component Value Date/Time   WBC 4.4 04/23/2023 1034   RBC 3.82 (L) 04/23/2023 1034   HGB 12.1 (L) 04/23/2023 1034   HCT 37.1 (L) 04/23/2023 1034   PLT 144 04/23/2023 1034   MCV 97.1 04/23/2023 1034   MCH 31.7 04/23/2023 1034   MCHC 32.6 04/23/2023 1034   RDW 12.4 04/23/2023 1034   LYMPHSABS 670 (L) 12/02/2022 1126   MONOABS 0.5 04/03/2021 1910   EOSABS 101 04/23/2023 1034   BASOSABS 62 04/23/2023 1034    CMP     Component Value Date/Time   NA 138 04/23/2023 1034   NA 138 11/20/2021 1012   K 4.5 04/23/2023 1034   CL 104  04/23/2023 1034   CO2 29 04/23/2023 1034   GLUCOSE 104 (H) 04/23/2023 1034   BUN 31 (H) 04/23/2023 1034   BUN 32 (H) 11/20/2021 1012   CREATININE 1.22 04/23/2023 1034   CALCIUM 9.5 04/23/2023 1034   PROT 5.9 (L) 04/23/2023 1034   PROT 6.1 03/15/2013 0744   ALBUMIN 3.9 04/03/2021 1804   ALBUMIN 4.3 03/15/2013 0744   AST 21 04/23/2023 1034   ALT 20 04/23/2023 1034   ALKPHOS 105 04/03/2021 1804   BILITOT 0.9 04/23/2023 1034   GFRNONAA >60 04/10/2021 0400   GFRNONAA 65 05/16/2020 1505   GFRAA 76 05/16/2020 1505     Assessment/Plan:   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  Normal LFTs.  INR elevated in the setting of Coumadin.  He appears to be in great shape for his age, though with his ejection fraction being 38% on nuclear stress test 2013 with recent echocardiogram January 2023 showing EF 45 to 50%. no obvious evidence of varices on CT. risk versus benefits of EGD to rule out varices must be considered.  Discussed this in detail with him and his wife.  Also spent extensive time discussing cirrhosis and providing patient education handouts.  They would like Dr. Lanetta Inch recommendation - Proceed with serologic workup for autoimmune and genetic etiology - CBC, CMP, PT/INR to recheck MELD - Due for [facility] screening 09/2023 - Will discuss with Dr. Adela Lank plans for EGD  ESRD s/p renal transplant 2013  CAD s/p PCI 2013 EF 38% on nuclear stress test 2013, EF 45 to 50% January 2023 on echocardiogram  PAF On Coumadin   This visit required 38 minutes of patient care (this includes precharting, chart review, review of results, face-to-face time used for counseling as well as treatment plan and follow-up. The patient was provided an opportunity to ask questions and all were answered. The patient agreed with the plan and demonstrated an understanding of the instructions.   Lara Mulch [facility] 07/17/2023, 3:27 PM  Cc: Octavia Heir, NP

## 2023-07-17 NOTE — Patient Instructions (Signed)
 Your provider has requested that you go to the basement level for lab work before leaving today. Press "B" on the elevator. The lab is located at the first door on the left as you exit the elevator.  _______________________________________________________  If your blood pressure at your visit was 140/90 or greater, please contact your primary care physician to follow up on this.  _______________________________________________________  If you are age 76 or older, your body mass index should be between 23-30. Your Body mass index is 26.76 kg/m. If this is out of the aforementioned range listed, please consider follow up with your Primary Care Provider.  If you are age 32 or younger, your body mass index should be between 19-25. Your Body mass index is 26.76 kg/m. If this is out of the aformentioned range listed, please consider follow up with your Primary Care Provider.   ________________________________________________________  The [facility] providers would like to encourage you to use [facility] to communicate with providers for non-urgent requests or questions.  Due to long hold times on the telephone, sending your provider a message by [facility] may be a faster and more efficient way to get a response.  Please allow 48 business hours for a response.  Please remember that this is for non-urgent requests.  _______________________________________________________  Thank you for trusting me with your gastrointestinal care!   Boone Master, PA

## 2023-07-17 NOTE — Progress Notes (Unsigned)
 Careteam: Patient Care Team: Octavia Heir, NP as PCP - General (Adult Health Nurse Practitioner) Nahser, Deloris Ping, MD as PCP - Cardiology (Cardiology) Fredrich Birks, OD as Referring Physician (Optometry) Bufford Buttner, MD as Consulting Physician (Nephrology) Armbruster, Willaim Rayas, MD as Consulting Physician (Gastroenterology) Aris Lot, MD as Consulting Physician (Dermatology)  Seen by: Hazle Nordmann, AGNP-C  PLACE OF SERVICE:  [facility]  Advanced Directive information    Allergies  Allergen Reactions   Losartan Anaphylaxis   Crestor [Rosuvastatin] Other (See Comments)    Elevated LFT's   Lorazepam     Pt is unsure of reaction    Morphine And Codeine Other (See Comments)    Has no effect on pt     Chief Complaint  Patient presents with   Medical Management of Chronic Issues    6 week new patient follow up.      HPI: Patient is a 78 y.o. male seen today for medical management of chronic conditions.   A1c 5.7 04/23/2023. Hypoglycemia improved since reducing 70/30 insulin. He is currently taking 18 unit in AM and 10 units with dinner. He does have glucose tablets if needed. Diabetic ye exam 04/16/2023> no retinopathy.   Pharmacy referral made last encounter to consider DOAC instead of warfarin for chronic afib. Insurance does not cover DOAC at this time. Pharmacy did recommend he follow coumadin clinic due to INR averaging 1.8. INR goal 2-3. Rechecked INR was 3.0. He is not eating green leafy vegetables.   Followed by [facility] due to past renal transplant s/p 2013. Remains on tacrolimus and mycophenolate.   Recently seen bu GI due to cirrhosis. Recent CT abdomen noted progression of MASH, no portal HTN. LFT's have been normal. He does have low platelets, but is on coumadin. GI recommending EGD to r/o varices and autoimmune/genetic workup.   03/19 + covid. Paxlovid contraindicated due to tacrolimus and coumadin use. Denies long term symptoms at this time.    Review of Systems:  Review of Systems  Constitutional: Negative.   HENT: Negative.    Eyes: Negative.   Respiratory: Negative.    Cardiovascular: Negative.   Gastrointestinal: Negative.   Genitourinary: Negative.   Musculoskeletal: Negative.   Skin: Negative.   Neurological: Negative.   Psychiatric/Behavioral: Negative.      Past Medical History:  Diagnosis Date   Adenomatous colon polyp    Allergy    BPH (benign prostatic hyperplasia)    CAD (coronary artery disease)    a. NSTEMI 09/2011 s/p PCI to LAD. b. CABG 01/2012 @ [facility].   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   Trifascicular block    Past Surgical History:  Procedure Laterality Date   ANTERIOR CERVICAL DECOMP/DISCECTOMY FUSION N/A 05/04/2018   Procedure: Cervical Five-Six Anterior cervical decompression/discectomy/fusion;  Surgeon: Shirlean Kelly, MD;  Location: [facility] OR;  Service: Neurosurgery;  Laterality: N/A;  Cervical Five-Six Anterior cervical decompression/discectomy/fusion   APPENDECTOMY     AV FISTULA PLACEMENT  2011   Left forearm   COLONOSCOPY     CORONARY ANGIOPLASTY WITH STENT PLACEMENT     CORONARY ARTERY BYPASS GRAFT  2013   KIDNEY TRANSPLANT  09/25/2011   LAPAROSCOPIC APPENDECTOMY N/A 04/06/2021   Procedure: APPENDECTOMY LAPAROSCOPIC;  Surgeon: Emelia Loron, MD;  Location: [facility] OR;  Service: General;  Laterality: N/A;    PERITONEAL CATHETER INSERTION  2011   Social History:   reports that he quit smoking about 32 years ago. His smoking use included cigarettes. He started smoking about 57 years ago. He has a 49.3 pack-year smoking history. He has never used smokeless tobacco. He reports current alcohol use. He reports that he does not use drugs.  Family History  Problem Relation Age of Onset   Diabetes Mother    COPD Mother    Heart attack Father        Unsure    COPD Father    Kidney disease Sister    Alcoholism Brother    Alzheimer's disease Brother    Alcohol abuse Brother    COPD Brother    Alcohol abuse Maternal Grandfather    Colon cancer Neg Hx    Stomach cancer Neg Hx    Rectal cancer Neg Hx    Esophageal cancer Neg Hx    Liver cancer Neg Hx    Pancreatic cancer Neg Hx     Medications: Patient's Medications  New Prescriptions   No medications on file  Previous Medications   ALLOPURINOL (ZYLOPRIM) 300 MG TABLET    TAKE 1 TABLET BY MOUTH DAILY TO PREVENT GOUT   ASCORBIC ACID (VITAMIN C) 1000 MG TABLET    Take 1,000 mg by mouth daily.   ATORVASTATIN (LIPITOR) 40 MG TABLET    Take 1 tab daily for cholesterol.   BRIMONIDINE (ALPHAGAN) 0.2 % OPHTHALMIC SOLUTION    SMARTSIG:In Eye(s)   BUPROPION (WELLBUTRIN XL) 300 MG 24 HR TABLET    Take  1 tablet  Daily for Mood, Focus & Concentration                                         /         TAKE                      BY                      MOUTH   CYANOCOBALAMIN (VITAMIN B12 PO)    Take 1 tablet by mouth daily.   FINASTERIDE (PROSCAR) 5 MG TABLET    TAKE 1 TABLET BY MOUTH DAILY FOR PROSTATE   GABAPENTIN (NEURONTIN) 300 MG CAPSULE    Take  1 capsule  3 x /day for  Pain                                                                                              /  TAKE                                         BY                                                 MOUTH   GLUCOSE BLOOD  (FREESTYLE TEST STRIPS) TEST STRIP    Check blood sugar 3 to 4 times daily for medication regulation.   INSULIN NPH-REGULAR HUMAN (NOVOLIN 70/30) (70-30) 100 UNIT/ML INJECTION    Inject 20 Units into the skin daily with breakfast AND 12 Units daily with supper. Inject 12-22 Units into the skin in the morning and at bedtime. 22 units in the AM, 12-15 units in the PM..   LATANOPROST (XALATAN) 0.005 % OPHTHALMIC SOLUTION    Place 1 drop into both eyes at bedtime.    MAGNESIUM 500 MG TABS    Take 1 tablet (500 mg total) by mouth daily.   MODAFINIL (PROVIGIL) 200 MG TABLET    Take 1 tablet (200 mg total) by mouth daily.   MYCOPHENOLATE (MYFORTIC) 360 MG TBEC EC TABLET    TAKE 1 TABLET BY MOUTH TWICE DAILY   TACROLIMUS (PROGRAF) 0.5 MG CAPSULE    Take 0.5 mg by mouth 2 (two) times daily. With 1mg  capsule for total of 1.5mg  twice daily   TACROLIMUS (PROGRAF) 1 MG CAPSULE    Take 1 mg by mouth 2 (two) times daily. With  0.5g capsule for total of 1.5mg  twice daily   TAMSULOSIN (FLOMAX) 0.4 MG CAPS CAPSULE    Take 1 capsule (0.4 mg total) by mouth daily.   TRAZODONE (DESYREL) 50 MG TABLET    TAKE ONE TABLET BY MOUTH DAILY ONE HOUR BEFORE BEDTIME AS NEEDED FOR SLEEP   VALSARTAN (DIOVAN) 80 MG TABLET    Take 1 tablet (80 mg total) by mouth daily.   WARFARIN (COUMADIN) 5 MG TABLET    Takes 5mg  by mouth daily Monday- Saturday, reduce dose to 2.5 mg every sunday  Modified Medications   No medications on file  Discontinued Medications   No medications on file    Physical Exam:  Vitals:   07/17/23 1304  BP: 102/60  Pulse: 60  Resp: 16  Temp: 97.7 F (36.5 C)  SpO2: 98%  Weight: 178 lb 3.2 oz (80.8 kg)  Height: 5\' 7"  (1.702 m)   Body mass index is 27.91 kg/m. Wt Readings from Last 3 Encounters:  07/17/23 178 lb 3.2 oz (80.8 kg)  06/25/23 177 lb 3.2 oz (80.4 kg)  06/05/23 177 lb 9.6 oz (80.6 kg)    Physical Exam Vitals reviewed.  Constitutional:      General: He is not in acute  distress. HENT:     Head: Normocephalic.  Eyes:     General:        Right eye: No discharge.        Left eye: No discharge.  Cardiovascular:     Rate and Rhythm: Normal rate. Rhythm irregular.     Pulses: Normal pulses.     Heart sounds: Murmur heard.  Pulmonary:     Effort: Pulmonary effort is normal.     Breath sounds: Normal breath sounds.  Abdominal:     General: Bowel sounds  are normal.     Palpations: Abdomen is soft.  Musculoskeletal:     Cervical back: Neck supple.  Skin:    Capillary Refill: Capillary refill takes less than 2 seconds.     Findings: No lesion or rash.  Neurological:     General: No focal deficit present.     Mental Status: He is alert and oriented to person, place, and time.  Psychiatric:        Mood and Affect: Mood normal.     Labs reviewed: Basic Metabolic Panel: Recent Labs    12/02/22 1126 02/18/23 1158 02/25/23 1650 04/08/23 0821 04/23/23 1034  NA 139 138 137  --  138  K 4.8 5.1 5.1  --  4.5  CL 105 104 102  --  104  CO2 29 28 30   --  29  GLUCOSE 95 103* 117*  --  104*  BUN 34* 28* 32*  --  31*  CREATININE 1.27 1.18 1.28 1.10 1.22  CALCIUM 9.5 10.1 10.0  --  9.5  MG 2.4 2.2  --   --  2.2  TSH  --  1.82 1.48  --  2.05   Liver Function Tests: Recent Labs    02/18/23 1158 02/25/23 1650 04/23/23 1034  AST 18 17 21   ALT 11 10 20   BILITOT 1.6* 1.5* 0.9  PROT 6.8 6.8 5.9*   Recent Labs    02/25/23 1650  LIPASE 12  AMYLASE 57   No results for input(s): "AMMONIA" in the last 8760 hours. CBC: Recent Labs    02/18/23 1158 02/25/23 1650 04/23/23 1034  WBC 6.0 6.2 4.4  NEUTROABS 4,584 4,439 3,023  HGB 12.7* 13.0* 12.1*  HCT 39.0 40.3 37.1*  MCV 95.8 96.4 97.1  PLT 155 168 144   Lipid Panel: Recent Labs    12/02/22 1126 04/23/23 1034  CHOL 133 130  HDL 76 64  LDLCALC 47 52  TRIG 36 48  CHOLHDL 1.8 2.0   TSH: Recent Labs    02/18/23 1158 02/25/23 1650 04/23/23 1034  TSH 1.82 1.48 2.05   A1C: Lab  Results  Component Value Date   HGBA1C 5.7 (H) 04/23/2023     Assessment/Plan 1. Type 2 diabetes mellitus with stage 2 chronic kidney disease, with long-term current use of insulin (HCC) - A1c 5.7 04/2023 - improved hypoglycemia with reduced 70/30 insulin - now taking 18 units in AM and 10 with dinner - UTD on diabetic eye exam - insulin NPH-regular Human (NOVOLIN 70/30) (70-30) 100 UNIT/ML injection; Inject 18 Units into the skin daily with breakfast AND 10 Units daily with supper. - Hemoglobin A1c; Future  2. Anticoagulant long-term use (Primary) - pharmacy referral completed> insurance does not cover DOAC - coumadin clinic recommended  - recent INR 3, at goal 2-3 - cont coumadin - Protime-INR - Protime-INR; Future  3. Chronic atrial fibrillation (HCC) - HR < 100  - cont warfarin for clot prevention - Protime-INR; Future  4. Essential hypertension - controlled with valsartan  5. Renal transplant, status post - followed by [facility] - cont tacrolimus and mycophenolate  6. Cirrhosis of liver without ascites, unspecified hepatic cirrhosis type (HCC) - followed by GI - Recent CT abdomen showed progression of MASH - LFT's stable, platelets stable - con coumadin - GI recommends EGD to r/o varices and autoimmune/genetic workup  Total time: 34 minutes. Greater than 50% of total time spent doing patient education regarding health maintenance, Afib, anticoagulant use, HTN, kidney health including symptom/medication  management.     Next appt: 11/20/2023  Hazle Nordmann, Juel Burrow  [facility] [phone]

## 2023-07-18 ENCOUNTER — Telehealth: Payer: Self-pay | Admitting: *Deleted

## 2023-07-18 ENCOUNTER — Encounter: Payer: Self-pay | Admitting: Orthopedic Surgery

## 2023-07-18 LAB — PROTIME-INR
INR: 3 — ABNORMAL HIGH
Prothrombin Time: 29.2 s — ABNORMAL HIGH (ref 9.0–11.5)

## 2023-07-18 NOTE — Telephone Encounter (Signed)
 Left voicemail for patient to call back.

## 2023-07-18 NOTE — Telephone Encounter (Signed)
 Author: Benancio Deeds, MD Service: Gastroenterology Author Type: Physician  Filed: 07/18/2023 10:29 AM Encounter Date: 07/17/2023 Status: Signed  Editor: Armbruster, Willaim Rayas, MD (Physician)   Randie Heinz, thank you Jonny Ruiz.   [facility] / Pod A RN can you let the patient know we can do an EGD at the [facility] to screen for varices, if he is willing. I think okay to do a diagnostic exam ON coumadin, to screen for varices. Thanks

## 2023-07-18 NOTE — Progress Notes (Signed)
 Great, thank you John.  [facility] / Pod A RN can you let the patient know we can do an EGD at the [facility] to screen for varices, if he is willing. I think okay to do a diagnostic exam ON coumadin, to screen for varices. Thanks

## 2023-07-18 NOTE — Telephone Encounter (Signed)
-----   Message from Benancio Deeds sent at 07/18/2023 10:28 AM EDT -----    ----- Message ----- From: Cathlyn Parsons, CRNA Sent: 07/18/2023  10:18 AM EDT To: Legrand Como, PA-C; #  Dr. Adela Lank,  This pt is cleared for anesthetic care at [facility].  Thanks,  Cyndee Brightly ----- Message ----- From: Benancio Deeds, MD Sent: 07/17/2023   5:32 PM EDT To: Cathlyn Parsons, CRNA; Legrand Como, PA-C     ----- Message ----- From: Legrand Como, PA-C Sent: 07/17/2023   3:30 PM EDT To: Benancio Deeds, MD  Very sweet couple.  Patient appears much better than chart depicts.  Active and younger than stated age.  They were supposed to see you to discuss endoscopy per your request but mix up put him on my schedule.  Please let me know your recommendations per my note.

## 2023-07-21 NOTE — Telephone Encounter (Signed)
 Left voicemail for patient to call back.

## 2023-07-23 NOTE — Telephone Encounter (Signed)
 Left message for patient to call back. I have also sent a mychart message asking patient to call back since we have been unable to speak with him by phone thus far.

## 2023-07-30 ENCOUNTER — Encounter: Payer: Medicare Other | Admitting: Nurse Practitioner

## 2023-08-01 ENCOUNTER — Other Ambulatory Visit: Payer: Self-pay | Admitting: Cardiovascular Disease

## 2023-08-01 DIAGNOSIS — I5042 Chronic combined systolic (congestive) and diastolic (congestive) heart failure: Secondary | ICD-10-CM

## 2023-08-01 DIAGNOSIS — R0989 Other specified symptoms and signs involving the circulatory and respiratory systems: Secondary | ICD-10-CM

## 2023-08-05 ENCOUNTER — Other Ambulatory Visit: Payer: Self-pay

## 2023-08-05 ENCOUNTER — Other Ambulatory Visit (HOSPITAL_COMMUNITY): Payer: Self-pay | Admitting: Cardiovascular Disease

## 2023-08-05 DIAGNOSIS — I6523 Occlusion and stenosis of bilateral carotid arteries: Secondary | ICD-10-CM

## 2023-08-05 DIAGNOSIS — M1A3790 Chronic gout due to renal impairment, unspecified ankle and foot, without tophus (tophi): Secondary | ICD-10-CM

## 2023-08-05 MED ORDER — ALLOPURINOL 300 MG PO TABS: ORAL_TABLET | ORAL | 3 refills | Status: AC

## 2023-08-05 NOTE — Telephone Encounter (Signed)
 Patient has request refill on medication. High risk warnings appear with medication. Pend and sent to PCP Arnetha Bhat, NP for approval.

## 2023-08-26 ENCOUNTER — Ambulatory Visit: Payer: Self-pay | Admitting: Internal Medicine

## 2023-08-26 ENCOUNTER — Ambulatory Visit (HOSPITAL_COMMUNITY)
Admission: RE | Admit: 2023-08-26 | Discharge: 2023-08-26 | Disposition: A | Discharge status: Home / Self Care | Source: Ambulatory Visit | Attending: Vascular Surgery | Admitting: Vascular Surgery

## 2023-08-26 DIAGNOSIS — I6523 Occlusion and stenosis of bilateral carotid arteries: Secondary | ICD-10-CM

## 2023-08-29 ENCOUNTER — Ambulatory Visit: Payer: Medicare Other | Admitting: Nurse Practitioner

## 2023-09-09 ENCOUNTER — Other Ambulatory Visit

## 2023-09-11 ENCOUNTER — Emergency Department (HOSPITAL_COMMUNITY)

## 2023-09-11 ENCOUNTER — Encounter (HOSPITAL_COMMUNITY): Payer: Self-pay | Admitting: *Deleted

## 2023-09-11 ENCOUNTER — Other Ambulatory Visit: Payer: Self-pay

## 2023-09-11 ENCOUNTER — Observation Stay (HOSPITAL_COMMUNITY)
Admission: EM | Admit: 2023-09-11 | Discharge: 2023-09-12 | Disposition: A | Discharge status: Home / Self Care | Attending: Internal Medicine | Admitting: Internal Medicine

## 2023-09-11 DIAGNOSIS — I132 Hypertensive heart and chronic kidney disease with heart failure and with stage 5 chronic kidney disease, or end stage renal disease: Secondary | ICD-10-CM | POA: Insufficient documentation

## 2023-09-11 DIAGNOSIS — Z794 Long term (current) use of insulin: Secondary | ICD-10-CM | POA: Insufficient documentation

## 2023-09-11 DIAGNOSIS — I509 Heart failure, unspecified: Secondary | ICD-10-CM

## 2023-09-11 DIAGNOSIS — R0602 Shortness of breath: Principal | ICD-10-CM

## 2023-09-11 DIAGNOSIS — E1122 Type 2 diabetes mellitus with diabetic chronic kidney disease: Secondary | ICD-10-CM | POA: Diagnosis not present

## 2023-09-11 DIAGNOSIS — N400 Enlarged prostate without lower urinary tract symptoms: Secondary | ICD-10-CM | POA: Insufficient documentation

## 2023-09-11 DIAGNOSIS — Z940 Kidney transplant status: Secondary | ICD-10-CM | POA: Diagnosis not present

## 2023-09-11 DIAGNOSIS — F10920 Alcohol use, unspecified with intoxication, uncomplicated: Secondary | ICD-10-CM | POA: Insufficient documentation

## 2023-09-11 DIAGNOSIS — N186 End stage renal disease: Secondary | ICD-10-CM | POA: Insufficient documentation

## 2023-09-11 DIAGNOSIS — I2510 Atherosclerotic heart disease of native coronary artery without angina pectoris: Secondary | ICD-10-CM | POA: Diagnosis not present

## 2023-09-11 DIAGNOSIS — F39 Unspecified mood [affective] disorder: Secondary | ICD-10-CM | POA: Insufficient documentation

## 2023-09-11 DIAGNOSIS — I480 Paroxysmal atrial fibrillation: Secondary | ICD-10-CM | POA: Insufficient documentation

## 2023-09-11 DIAGNOSIS — I5043 Acute on chronic combined systolic (congestive) and diastolic (congestive) heart failure: Secondary | ICD-10-CM | POA: Diagnosis not present

## 2023-09-11 DIAGNOSIS — I5042 Chronic combined systolic (congestive) and diastolic (congestive) heart failure: Secondary | ICD-10-CM | POA: Diagnosis not present

## 2023-09-11 DIAGNOSIS — E785 Hyperlipidemia, unspecified: Secondary | ICD-10-CM | POA: Diagnosis not present

## 2023-09-11 DIAGNOSIS — M109 Gout, unspecified: Secondary | ICD-10-CM | POA: Insufficient documentation

## 2023-09-11 DIAGNOSIS — E8770 Fluid overload, unspecified: Secondary | ICD-10-CM

## 2023-09-11 LAB — PROTIME-INR
INR: 2.9 — ABNORMAL HIGH (ref 0.8–1.2)
Prothrombin Time: 30.3 s — ABNORMAL HIGH (ref 11.4–15.2)

## 2023-09-11 LAB — TROPONIN I (HIGH SENSITIVITY)
Troponin I (High Sensitivity): 58 ng/L — ABNORMAL HIGH (ref <18)
Troponin I (High Sensitivity): 53 ng/L — ABNORMAL HIGH (ref <18)

## 2023-09-11 LAB — HEPATIC FUNCTION PANEL
ALT: 51 U/L — ABNORMAL HIGH (ref 0–44)
AST: 52 U/L — ABNORMAL HIGH (ref 15–41)
Albumin: 3.5 g/dL (ref 3.5–5.0)
Alkaline Phosphatase: 138 U/L — ABNORMAL HIGH (ref 38–126)
Bilirubin, Direct: 0.3 mg/dL — ABNORMAL HIGH (ref 0.0–0.2)
Indirect Bilirubin: 1.2 mg/dL — ABNORMAL HIGH (ref 0.3–0.9)
Total Bilirubin: 1.5 mg/dL — ABNORMAL HIGH (ref 0.0–1.2)
Total Protein: 6.7 g/dL (ref 6.5–8.1)

## 2023-09-11 LAB — BASIC METABOLIC PANEL WITH GFR
Anion gap: 7 (ref 5–15)
BUN: 32 mg/dL — ABNORMAL HIGH (ref 8–23)
CO2: 25 mmol/L (ref 22–32)
Calcium: 9.5 mg/dL (ref 8.9–10.3)
Chloride: 103 mmol/L (ref 98–111)
Creatinine, Ser: 1.04 mg/dL (ref 0.61–1.24)
GFR, Estimated: >60 mL/min (ref >60)
Glucose, Bld: 106 mg/dL — ABNORMAL HIGH (ref 70–99)
Potassium: 4.5 mmol/L (ref 3.5–5.1)
Sodium: 135 mmol/L (ref 135–145)

## 2023-09-11 LAB — CBC
HCT: 40.4 % (ref 39.0–52.0)
Hemoglobin: 13.1 g/dL (ref 13.0–17.0)
MCH: 32.3 pg (ref 26.0–34.0)
MCHC: 32.4 g/dL (ref 30.0–36.0)
MCV: 99.8 fL (ref 80.0–100.0)
Platelets: 143 10*3/uL — ABNORMAL LOW (ref 150–400)
RBC: 4.05 MIL/uL — ABNORMAL LOW (ref 4.22–5.81)
RDW: 13.4 % (ref 11.5–15.5)
WBC: 7 10*3/uL (ref 4.0–10.5)
nRBC: 0 % (ref 0.0–0.2)

## 2023-09-11 LAB — GLUCOSE, CAPILLARY: Glucose-Capillary: 150 mg/dL — ABNORMAL HIGH (ref 70–99)

## 2023-09-11 LAB — BRAIN NATRIURETIC PEPTIDE: B Natriuretic Peptide: 1190.5 pg/mL — ABNORMAL HIGH (ref 0.0–100.0)

## 2023-09-11 MED ORDER — BUPROPION HCL ER (XL) 300 MG PO TB24
300.0000 mg | ORAL_TABLET | Freq: Every day | ORAL | Status: DC
  Administered 2023-09-12: 300 mg via ORAL
  Filled 2023-09-11: qty 1

## 2023-09-11 MED ORDER — TAMSULOSIN HCL 0.4 MG PO CAPS
0.4000 mg | ORAL_CAPSULE | Freq: Every day | ORAL | Status: DC
  Administered 2023-09-11: 0.4 mg via ORAL
  Filled 2023-09-11: qty 1

## 2023-09-11 MED ORDER — SENNOSIDES-DOCUSATE SODIUM 8.6-50 MG PO TABS: 1.0000 | ORAL_TABLET | Freq: Every evening | ORAL | Status: DC | PRN

## 2023-09-11 MED ORDER — ATORVASTATIN CALCIUM 40 MG PO TABS
40.0000 mg | ORAL_TABLET | Freq: Every day | ORAL | Status: DC
  Administered 2023-09-12: 40 mg via ORAL
  Filled 2023-09-11: qty 1

## 2023-09-11 MED ORDER — PANTOPRAZOLE SODIUM 40 MG PO TBEC
40.0000 mg | DELAYED_RELEASE_TABLET | Freq: Every day | ORAL | Status: DC
  Administered 2023-09-12: 40 mg via ORAL
  Filled 2023-09-11: qty 1

## 2023-09-11 MED ORDER — ALLOPURINOL 100 MG PO TABS
300.0000 mg | ORAL_TABLET | Freq: Every day | ORAL | Status: DC
  Administered 2023-09-12: 300 mg via ORAL
  Filled 2023-09-11: qty 3

## 2023-09-11 MED ORDER — WARFARIN - PHARMACIST DOSING INPATIENT: Freq: Every day | Status: DC

## 2023-09-11 MED ORDER — FUROSEMIDE 10 MG/ML IJ SOLN
40.0000 mg | Freq: Every day | INTRAMUSCULAR | Status: DC
  Administered 2023-09-12: 40 mg via INTRAVENOUS
  Filled 2023-09-11: qty 4

## 2023-09-11 MED ORDER — TACROLIMUS 1 MG PO CAPS
1.0000 mg | ORAL_CAPSULE | Freq: Two times a day (BID) | ORAL | Status: DC
  Administered 2023-09-11 – 2023-09-12 (×2): 1 mg via ORAL
  Filled 2023-09-11 (×3): qty 1

## 2023-09-11 MED ORDER — MYCOPHENOLATE SODIUM 180 MG PO TBEC
360.0000 mg | DELAYED_RELEASE_TABLET | Freq: Two times a day (BID) | ORAL | Status: DC
  Administered 2023-09-11 – 2023-09-12 (×2): 360 mg via ORAL
  Filled 2023-09-11 (×3): qty 2

## 2023-09-11 MED ORDER — BRIMONIDINE TARTRATE 0.2 % OP SOLN
1.0000 [drp] | Freq: Two times a day (BID) | OPHTHALMIC | Status: DC
  Administered 2023-09-11 – 2023-09-12 (×2): 1 [drp] via OPHTHALMIC
  Filled 2023-09-11: qty 5

## 2023-09-11 MED ORDER — TACROLIMUS 0.5 MG PO CAPS
0.5000 mg | ORAL_CAPSULE | Freq: Two times a day (BID) | ORAL | Status: DC
  Administered 2023-09-11 – 2023-09-12 (×2): 0.5 mg via ORAL
  Filled 2023-09-11 (×3): qty 1

## 2023-09-11 MED ORDER — IRBESARTAN 75 MG PO TABS
75.0000 mg | ORAL_TABLET | Freq: Every day | ORAL | Status: DC
  Administered 2023-09-12: 75 mg via ORAL
  Filled 2023-09-11: qty 1

## 2023-09-11 MED ORDER — FUROSEMIDE 10 MG/ML IJ SOLN
40.0000 mg | Freq: Once | INTRAMUSCULAR | Status: AC
  Administered 2023-09-11: 40 mg via INTRAVENOUS
  Filled 2023-09-11: qty 4

## 2023-09-11 MED ORDER — IOHEXOL 350 MG/ML SOLN
75.0000 mL | Freq: Once | INTRAVENOUS | Status: AC | PRN
  Administered 2023-09-11: 75 mL via INTRAVENOUS

## 2023-09-11 MED ORDER — MODAFINIL 200 MG PO TABS
200.0000 mg | ORAL_TABLET | Freq: Every day | ORAL | Status: DC
  Administered 2023-09-12: 200 mg via ORAL
  Filled 2023-09-11: qty 1

## 2023-09-11 MED ORDER — ACETAMINOPHEN 650 MG RE SUPP: 650.0000 mg | Freq: Four times a day (QID) | RECTAL | Status: DC | PRN

## 2023-09-11 MED ORDER — INSULIN ASPART 100 UNIT/ML IJ SOLN
0.0000 [IU] | Freq: Three times a day (TID) | INTRAMUSCULAR | Status: DC
  Administered 2023-09-12: 2 [IU] via SUBCUTANEOUS
  Administered 2023-09-12: 1 [IU] via SUBCUTANEOUS
  Filled 2023-09-11: qty 0.09

## 2023-09-11 MED ORDER — ONDANSETRON HCL 4 MG PO TABS: 4.0000 mg | ORAL_TABLET | Freq: Four times a day (QID) | ORAL | Status: DC | PRN

## 2023-09-11 MED ORDER — FINASTERIDE 5 MG PO TABS
5.0000 mg | ORAL_TABLET | Freq: Every day | ORAL | Status: DC
  Administered 2023-09-12: 5 mg via ORAL
  Filled 2023-09-11: qty 1

## 2023-09-11 MED ORDER — INSULIN ASPART 100 UNIT/ML IJ SOLN
0.0000 [IU] | Freq: Every day | INTRAMUSCULAR | Status: DC
  Filled 2023-09-11: qty 0.05

## 2023-09-11 MED ORDER — LATANOPROST 0.005 % OP SOLN
1.0000 [drp] | Freq: Every day | OPHTHALMIC | Status: DC
  Administered 2023-09-11: 1 [drp] via OPHTHALMIC
  Filled 2023-09-11: qty 2.5

## 2023-09-11 MED ORDER — GABAPENTIN 300 MG PO CAPS
300.0000 mg | ORAL_CAPSULE | Freq: Every day | ORAL | Status: DC
  Administered 2023-09-11: 300 mg via ORAL
  Filled 2023-09-11: qty 1

## 2023-09-11 MED ORDER — ACETAMINOPHEN 325 MG PO TABS
650.0000 mg | ORAL_TABLET | Freq: Four times a day (QID) | ORAL | Status: DC | PRN
Start: 2023-09-11 — End: 2023-09-12

## 2023-09-11 MED ORDER — ONDANSETRON HCL 4 MG/2ML IJ SOLN
4.0000 mg | Freq: Four times a day (QID) | INTRAMUSCULAR | Status: DC | PRN
Start: 2023-09-11 — End: 2023-09-12

## 2023-09-11 MED ORDER — TRAZODONE HCL 100 MG PO TABS
50.0000 mg | ORAL_TABLET | Freq: Every day | ORAL | Status: DC
  Administered 2023-09-11: 50 mg via ORAL
  Filled 2023-09-11: qty 1

## 2023-09-11 NOTE — Hospital Course (Addendum)
 78 year old man history of diabetes, BPH, hyperlipidemia, GERD, hypertension, diabetes previous history of CKD liver cirrhosis,, A-fib on Coumadin  presented to the ED with shortness of breath going on for couple of weeks, symptoms worse with laying flat and on walking. Symptoms are worsening for sometime. He had some balance issues. Patient otherwise denies any nausea, vomiting, chest pain,fever, chills, headache, focal weakness, numbness tingling, speech difficulties  In the ED: Vitals tachycardic 119 afebrile on room air.  Labs with BNP 1190 troponin 58> 53 flat stable CBC blood glucose 106 AST ALT in 50s with T. bili 1.5.Chest x-ray no pneumonia, CT scan no PE, has finding of pulmonary edema, Lasix IV was given and admission was requested for further management Patient fluid status improved much felt symptomatically improved after IV Lasix. Echo resulted w/ ef   35-40%-patient clinically improved after IV Lasix.  Discussed with Dr. Arlene Ben from cardiology, at this point since patient clinically improved, plan for discharge home address GDMT as outpatient given patient could not afford Entresto  on last cardiology encounter  Subjective: Seen and examined Overnight BP stable on room air Labs remained stable INR 2.3. Weight down to 167 5 from 170 on admission  Discharge diagnosis   Acute on chronic combined CHF CT chest with concern for pulmonary edema but no PE.Followed by cardiology as outpatient, EF from January 23 with 45-50% In cardiology note from 2024 it seems she was placed on Entresto  but unable to afford  Echo obtained shows EF 35-40% moderately decreased function with global hypokinesia diastolic parameters normal LA moderately dilated trivial MR aortic valve tricuspid moderate calcification, global hypokinesia worse in the posterior lateral wall and septum. Will continue on diuresis PO.  Volume status improved patient is eager to go home, discussed with cardiology,, no need for inpatient  consult they have arranged outpatient follow-up and continue Lasix at home CHF instruction provided to the patient.  History of liver cirrhosis with mild transaminitis: Liver cirrhosis appears compensated with mild transaminitis element stable   CAD status post stenting June 20,013 and CABG in 2013 Elevated troponin: Troponins elevated but flat without ischemic changes/delta changes.  Continue Coumadin .  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   Hyperlipidemia: Continue statin   Diabetes: Blood sugar well-controlled, cont ssi,    BPH: Cont home Proscar  and Flomax 

## 2023-09-11 NOTE — Progress Notes (Signed)
 PHARMACY - ANTICOAGULATION CONSULT NOTE  Pharmacy Consult for Warfarin Indication: atrial fibrillation  Allergies  Allergen Reactions   Losartan Anaphylaxis   Tape Other (See Comments)    SKIN IS VERY DELICATE AND WILL TEAR AND BRUISE EASILY!!!   Morphine  Other (See Comments)    This is INEFFECTIVE   Crestor [Rosuvastatin] Other (See Comments)    Elevated LFT's   Lorazepam Other (See Comments)    Pt is unsure of reaction     Patient Measurements: Height: 5' 7.5" (171.5 cm) Weight: 77.1 kg (170 lb) IBW/kg (Calculated) : 67.25 HEPARIN  DW (KG): 77.1  Vital Signs: Temp: 97.9 F (36.6 C) (06/05 1333) Temp Source: Oral (06/05 1333) BP: 137/67 (06/05 1326) Pulse Rate: 119 (06/05 1333)  Labs: Recent Labs    09/11/23 1338 09/11/23 1501  HGB 13.1  --   HCT 40.4  --   PLT 143*  --   LABPROT  --  30.3*  INR  --  2.9*  CREATININE 1.04  --   TROPONINIHS 58* 53*    Estimated Creatinine Clearance: 58.4 mL/min (by C-G formula based on SCr of 1.04 mg/dL).   Medical History: Past Medical History:  Diagnosis Date   Adenomatous colon polyp    Allergy    BPH (benign prostatic hyperplasia)    CAD (coronary artery disease)    a. NSTEMI 09/2011 s/p PCI to LAD. b. CABG 01/2012 @ [facility].   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   Trifascicular block     Medications:  (Not in a hospital admission)   Scheduled:   [START ON 09/12/2023] allopurinol   300 mg Oral Daily   [START ON 09/12/2023] atorvastatin   40 mg Oral Daily   brimonidine  1 drop Both Eyes BID   [START ON 09/12/2023] buPROPion   300 mg Oral Daily   [START ON 09/12/2023] finasteride   5 mg Oral Daily   [START ON 09/12/2023] furosemide  40 mg Intravenous Daily   gabapentin   300 mg Oral QHS   insulin  aspart  0-5 Units Subcutaneous QHS   [START ON 09/12/2023] insulin  aspart  0-9 Units Subcutaneous TID WC   [START ON 09/12/2023] irbesartan  75 mg Oral Daily   latanoprost   1 drop Both Eyes QHS   [START ON 09/12/2023] modafinil   200 mg Oral Daily   mycophenolate   360 mg Oral BID   [START ON 09/12/2023] pantoprazole   40 mg Oral QAC breakfast   tacrolimus   0.5 mg Oral Q12H   tacrolimus   1 mg Oral Q12H   tamsulosin   0.4 mg Oral QHS   traZODone   50 mg Oral QHS    Assessment: 77 yoM admitted on 6/5 with SOB.  PMH includes AFib on chronic warfarin anticoagulation.  Pharmacy is consulted to continue warfarin.  PTA warfarin 5mg  PO daily - last dose on 6/5 AM  Admit INR 2.9 CBC: Hgb 13.1, Plt 143.  No bleeding or complications reported. AST/ALT 52/51 and Tbili 1.5 mildly elevated.   Goal of Therapy:  INR 2-3 Monitor platelets by anticoagulation protocol: Yes   Plan:  Warfarin already taken today, no further doses.  Daily PT/INR. Monitor for signs and symptoms of bleeding.   Kendall Pauls PharmD, BCPS [facility] main pharmacy [phone] 09/11/2023 5:35 PM

## 2023-09-11 NOTE — H&P (Signed)
 History and Physical    Robert Arnold. JYN:[id number] DOB: 19-Dec-1945 DOA: 09/11/2023  PCP: Arnetha Bhat, NP   Patient coming from: Home. At baseline at home with wife and ambulatory.  Chief Complaint  Patient presents with   Shortness of Breath      HPI: 78 year old man history of diabetes, BPH, hyperlipidemia, GERD, hypertension, diabetes previous history of CKD liver cirrhosis,, A-fib on Coumadin  presented to the ED with shortness of breath going on for couple of weeks, symptoms worse with laying flat and on walking. Symptoms are worsening for sometime. He had some balance issues. Patient otherwise denies any nausea, vomiting, chest pain,fever, chills, headache, focal weakness, numbness tingling, speech difficulties  In the ED: Vitals tachycardic 119 afebrile on room air.  Labs with BNP 1190 troponin 58> 53 flat stable CBC blood glucose 106 AST ALT in 50s with T. 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  Will continue IV Lasix CT chest with concern for pulmonary edema but no PE.   Followed by cardiology as outpatient, EF from January 23 with 45-50% In cardiology note from 2024 it seems she was placed on Entresto  but currently not in active med list. Obtain echocardiogram, keep salt and fluid restricted diet monitor intake output and daily weight and monitor renal function. Liver cirrhosis appears compensated with mild transaminitis element stable  CAD status post stenting June 20,013 and CABG in 2013 Elevated troponin: Troponins flat without ischemic changes delta changes.  Already on Coumadin  and anticoagulated.  Obtain echocardiogram as above.  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  Hyperlipidemia: Continue statin  Diabetes: Resume insulin  sliding scale insulin / hold mix insulin  for now.  BPH: Resume home Proscar  and Flomax   Mood disorder: On Neurontin  Provigil  Wellbutrin  continue  Gout: Continue allopurinol   Severity of Illness: The appropriate patient status for this patient is OBSERVATION. Observation status is judged to be reasonable and necessary in order to provide the required intensity of service to ensure the patient's safety. The patient's presenting symptoms, physical exam findings, and initial radiographic and laboratory data in the context of their medical condition is felt to place them at decreased risk for further clinical deterioration. Furthermore, it is anticipated that the patient will be medically stable for discharge from the hospital within 2 midnights of admission.    DVT prophylaxis: coumadin  Code Status:   Code Status: Full Code  Family Communication: Admission, patients condition and plan of care including tests being ordered have been discussed with the patient and wife  who indicate understanding and agree with the plan and Code Status.  Consults called:  None  Review of Systems: All systems were reviewed and were negative except as mentioned in HPI above. Negative for fever Negative for chest pain  Past Medical History:  Diagnosis Date   Adenomatous colon polyp    Allergy    BPH (benign prostatic hyperplasia)    CAD (coronary artery disease)    a. NSTEMI 09/2011 s/p PCI to LAD. b. CABG 01/2012 @ [facility].   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  for prophylaxis   Ischemic cardiomyopathy    Mild aortic stenosis    Mild dilation of ascending aorta (HCC)    NSTEMI (non-ST elevated myocardial infarction) (HCC) 11/26/2013   2013    OSA (obstructive sleep apnea)    PAF (paroxysmal atrial fibrillation) (HCC)    Testosterone  deficiency 02/27/2018   Trifascicular block     Past Surgical History:  Procedure Laterality Date   ANTERIOR CERVICAL DECOMP/DISCECTOMY FUSION N/A 05/04/2018   Procedure: Cervical Five-Six Anterior cervical decompression/discectomy/fusion;  Surgeon: Yvonna Herder, MD;  Location: [facility] OR;  Service: Neurosurgery;  Laterality: N/A;  Cervical Five-Six Anterior cervical decompression/discectomy/fusion   APPENDECTOMY     AV FISTULA PLACEMENT  2011   Left forearm   COLONOSCOPY     CORONARY ANGIOPLASTY WITH STENT PLACEMENT     CORONARY ARTERY BYPASS GRAFT  2013   KIDNEY TRANSPLANT  09/25/2011   LAPAROSCOPIC APPENDECTOMY N/A 04/06/2021   Procedure: APPENDECTOMY LAPAROSCOPIC;  Surgeon: Enid Harry, MD;  Location: [facility] OR;  Service: General;  Laterality: N/A;   PERITONEAL CATHETER INSERTION  2011     reports that he quit smoking about 32 years ago. His smoking use included cigarettes. He started smoking about 57 years ago. He has a 49.3 pack-year smoking history. He has never used smokeless tobacco. He reports current alcohol use. He reports that he does not use drugs.  Allergies  Allergen Reactions   Losartan Anaphylaxis   Tape Other (See Comments)    SKIN IS VERY DELICATE AND WILL TEAR AND BRUISE EASILY!!!   Morphine  Other (See Comments)    This is INEFFECTIVE   Crestor [Rosuvastatin] Other (See Comments)    Elevated LFT's   Lorazepam Other (See Comments)    Pt is unsure of reaction     Family History  Problem Relation Age of Onset   Diabetes Mother    COPD Mother    Heart attack Father        Unsure    COPD Father     Kidney disease Sister    Alcoholism Brother    Alzheimer's disease Brother    Alcohol abuse Brother    COPD Brother    Alcohol abuse Maternal Grandfather    Colon cancer Neg Hx    Stomach cancer Neg Hx    Rectal cancer Neg Hx    Esophageal cancer Neg Hx    Liver cancer Neg Hx    Pancreatic cancer Neg Hx      Prior to Admission medications   Medication Sig Start Date End Date Taking? Authorizing Provider  allopurinol  (ZYLOPRIM ) 300 MG tablet TAKE 1 TABLET BY MOUTH DAILY TO PREVENT GOUT 08/05/23  Yes Fargo, Amy E, NP  atorvastatin  (LIPITOR) 40 MG tablet Take 1 tab daily for cholesterol. Patient taking differently: Take 40 mg by mouth in the morning. 06/17/23  Yes Webb, Padonda B, FNP  brimonidine (ALPHAGAN) 0.2 % ophthalmic solution Place 1 drop into both eyes 2 (two) times daily. 03/27/22  Yes [provider]  buPROPion  (WELLBUTRIN  XL) 300 MG 24 hr tablet Take  1 tablet  Daily for Mood, Focus & Concentration                                         /  TAKE                      BY                      MOUTH Patient taking differently: Take 300 mg by mouth in the morning. 12/24/22  Yes Vangie Genet, MD  Cyanocobalamin (VITAMIN B12 PO) Take 1 tablet by mouth daily.   Yes [provider]  finasteride  (PROSCAR ) 5 MG tablet TAKE 1 TABLET BY MOUTH DAILY FOR PROSTATE 03/31/23  Yes Wilkinson, Dana E, FNP  gabapentin  (NEURONTIN ) 300 MG capsule Take  1 capsule  3 x /day for  Pain                                                                                              /                                                                   TAKE                                         BY                                                 MOUTH Patient taking differently: Take 300 mg by mouth at bedtime. 10/11/22  Yes Vangie Genet, MD  latanoprost  (XALATAN ) 0.005 % ophthalmic solution Place 1 drop into both eyes at bedtime.  04/11/16  Yes [provider]  Magnesium  500 MG  TABS Take 1 tablet (500 mg total) by mouth daily. 07/05/21  Yes [place] Bureau, NP  modafinil  (PROVIGIL ) 200 MG tablet Take 1 tablet (200 mg total) by mouth daily. 06/23/23  Yes Fargo, Amy E, NP  mycophenolate  (MYFORTIC ) 360 MG TBEC EC tablet TAKE 1 TABLET BY MOUTH TWICE DAILY 05/06/16  Yes Vangie Genet, MD  NOVOLOG  FLEXPEN 100 UNIT/ML FlexPen Inject 2-3 Units into the skin See admin instructions. Inject 2-3 units into the skin three times a day before meals, per sliding scale   Yes [provider]  pantoprazole  (PROTONIX ) 40 MG tablet Take 40 mg by mouth daily before breakfast.   Yes [provider]  tacrolimus  (PROGRAF ) 0.5 MG capsule Take 0.5 mg by mouth every 12 (twelve) hours.   Yes [provider]  tacrolimus  (PROGRAF ) 1 MG capsule Take 1 mg by mouth every 12 (twelve) hours.   Yes [provider]  tamsulosin  (FLOMAX ) 0.4 MG CAPS capsule Take 1 capsule (0.4 mg total) by mouth daily. Patient taking differently: Take 0.4 mg by mouth at bedtime. 06/23/23  Yes Fargo, Amy E,  NP  traZODone  (DESYREL ) 50 MG tablet TAKE ONE TABLET BY MOUTH DAILY ONE HOUR BEFORE BEDTIME AS NEEDED FOR SLEEP Patient taking differently: Take 50 mg by mouth at bedtime. 07/15/23  Yes Fargo, Amy E, NP  valsartan  (DIOVAN ) 80 MG tablet TAKE 1 TABLET BY MOUTH DAILY 08/04/23  Yes Nahser, Lela Purple, MD  warfarin (COUMADIN ) 5 MG tablet Takes 5mg  by mouth daily Monday- Saturday, reduce dose to 2.5 mg every sunday Patient taking differently: Take 5 mg by mouth in the morning. 06/08/23  Yes Fargo, Amy E, NP  glucose blood (FREESTYLE TEST STRIPS) test strip Check blood sugar 3 to 4 times daily for medication regulation. 12/04/15   Vangie Genet, MD    Physical Exam: Vitals:   09/11/23 1326 09/11/23 1327 09/11/23 1333 09/11/23 1354  BP: 137/67     Pulse:   (!) 119   Resp: (!) 22  16   Temp: 97.6 F (36.4 C)  97.9 F (36.6 C)   TempSrc: Oral  Oral   SpO2: 98%  100%   Weight:  77.1 kg  77.1 kg   Height:  5' 7.5" (1.715 m)  5' 7.5" (1.715 m)    General exam: AAO x 3, NAD, weak appearing. HEENT:Oral mucosa moist, Ear/Nose WNL grossly, dentition normal. Respiratory system: bilaterally crackles,no use of accessory muscle Cardiovascular system: S1 & S2 +, No JVD,. Gastrointestinal system: Abdomen soft, NT,ND, BS+ Nervous System:Alert, awake, moving extremities and grossly nonfocal Extremities: No edema, distal peripheral pulses palpable.  Skin: No rashes,no icterus. MSK: Normal muscle bulk,tone, power   Labs on Admission: I have personally reviewed following labs and imaging studies  CBC: Recent Labs  Lab 09/11/23 1338  WBC 7.0  HGB 13.1  HCT 40.4  MCV 99.8  PLT 143*   Basic Metabolic Panel: Recent Labs  Lab 09/11/23 1338  NA 135  K 4.5  CL 103  CO2 25  GLUCOSE 106*  BUN 32*  CREATININE 1.04  CALCIUM  9.5   Estimated Creatinine Clearance: 58.4 mL/min (by C-G formula based on SCr of 1.04 mg/dL). Recent Labs  Lab 09/11/23 1501  AST 52*  ALT 51*  ALKPHOS 138*  BILITOT 1.5*  PROT 6.7  ALBUMIN 3.5   No results for input(s): "LIPASE", "AMYLASE" in the last 168 hours. No results for input(s): "AMMONIA" in the last 168 hours. Coagulation Profile: Recent Labs  Lab 09/11/23 1501  INR 2.9*  Cardiac Panel (last 3 results) Recent Labs    09/11/23 1338 09/11/23 1501  TROPONINIHS 58* 53*    No results for input(s): "TSH", "T4TOTAL", "FREET4", "T3FREE", "THYROIDAB" in the last 72 hours. Urine analysis:    Component Value Date/Time   COLORURINE YELLOW 07/04/2021 0931   APPEARANCEUR CLEAR 07/04/2021 0931   LABSPEC 1.017 07/04/2021 0931   PHURINE 5.5 07/04/2021 0931   GLUCOSEU NEGATIVE 07/04/2021 0931   HGBUR NEGATIVE 07/04/2021 0931   BILIRUBINUR NEGATIVE 04/03/2021 2205   KETONESUR NEGATIVE 07/04/2021 0931   PROTEINUR NEGATIVE 07/04/2021 0931   UROBILINOGEN 0.2 09/06/2013 1706   NITRITE NEGATIVE 07/04/2021 0931   LEUKOCYTESUR NEGATIVE 07/04/2021 0931     Radiological Exams on Admission: CT Angio Chest PE W and/or Wo Contrast Result Date: 09/11/2023 CLINICAL DATA:  Pulmonary embolism (PE) suspected, high prob Shortness of breath. EXAM: CT ANGIOGRAPHY CHEST WITH CONTRAST TECHNIQUE: Multidetector CT imaging of the chest was performed using the standard protocol during bolus administration of intravenous contrast. Multiplanar CT image reconstructions and MIPs were obtained to evaluate the vascular anatomy. RADIATION DOSE  REDUCTION: This exam was performed according to the departmental dose-optimization program which includes automated exposure control, adjustment of the mA and/or kV according to patient size and/or use of iterative reconstruction technique. CONTRAST:  75mL OMNIPAQUE  IOHEXOL  350 MG/ML SOLN COMPARISON:  Radiograph earlier today. FINDINGS: Cardiovascular: There are no filling defects within the pulmonary arteries to suggest pulmonary embolus. The main pulmonary artery is dilated at 3.5 cm. Median sternotomy and CABG. Aortic atherosclerosis. The heart is enlarged. Contrast refluxes into the hepatic veins and IVC. Mediastinum/Nodes: Shotty mediastinal and hilar lymph nodes, likely reactive. Reference right infrahilar node measuring 10 mm series 4, image 65. 11 mm subcarinal node series 4, image 53. unremarkable esophagus. No visible thyroid nodule. Lungs/Pleura: Heterogeneous pulmonary parenchyma with geographic areas of ground-glass. There is mild septal thickening at the apices. Mild central bronchial thickening. No confluent airspace disease. No significant pleural effusion. Upper Abdomen: Contrast refluxes into the hepatic veins and IVC. Gallstones. Nodular contours of the liver raise concern for cirrhosis. Multiple splenic granuloma. Musculoskeletal: Median sternotomy. There are no acute or suspicious osseous abnormalities. Thoracic spondylosis. Review of the MIP images confirms the above findings. IMPRESSION: 1. No pulmonary embolus. 2.  Heterogeneous pulmonary parenchyma with geographic areas of ground-glass. Mild septal thickening at the apices. Findings may represent pulmonary edema, inflammatory change or less likely atypical infection. 3. Cardiomegaly. Contrast refluxes into the hepatic veins and IVC, suggesting right heart dysfunction. 4. Dilated main pulmonary artery, can be seen with pulmonary arterial hypertension. 5. Cirrhosis. Cholelithiasis. Aortic Atherosclerosis (ICD10-I70.0). Electronically Signed   By: Chadwick Colonel M.D.   On: 09/11/2023 16:15   DG Chest 2 View Result Date: 09/11/2023 CLINICAL DATA:  Shortness of breath for the past 2 days. EXAM: CHEST - 2 VIEW COMPARISON:  04/09/2021 FINDINGS: Poor inspiration. Mildly enlarged cardiac silhouette without significant change. Interval small amount of linear atelectasis or scarring in the left mid to lower lung zone. Otherwise, clear lungs with normal vascularity. Stable minimal chronic interstitial prominence. No pleural fluid. Post CABG changes with post median sternotomy fixation. Cervical spine fixation hardware. IMPRESSION: 1. Interval small amount of linear atelectasis or scarring in the left mid to lower lung zone. 2. Stable mild cardiomegaly. 3. No acute abnormality. Electronically Signed   By: Catherin Closs M.D.   On: 09/11/2023 14:19    Lesa Rape MD [facility]  If 7PM-7AM, please contact night-coverage [email or web address]  09/11/2023, 5:36 PM

## 2023-09-11 NOTE — ED Notes (Signed)
 Pt CT

## 2023-09-11 NOTE — ED Notes (Signed)
 Floor called and made aware that Pt is heading up.

## 2023-09-11 NOTE — ED Provider Notes (Signed)
 [place] EMERGENCY DEPARTMENT AT [facility] Provider Note   CSN: [id number] Arrival date & time: 09/11/23  1316     History  Chief Complaint  Patient presents with   Shortness of Breath    Robert Stockley. is a 78 y.o. male.  Patient here with shortness of breath.  States ongoing on and off here recently but worse over the last few days.  He is on Coumadin  for A-fib.  Denies any chest pain.  He has had some runny nose congestion at times here recently 2.  Denies any major cough or sputum production.  Denies any fever.  Denies any leg swelling.  History of diabetes BPH paroxysmal A-fib ESRD CAD CKD cirrhosis of the liver.  Denies any abdominal pain or abdominal swelling.  He is never had ascites.  The history is provided by the patient.       Home Medications Prior to Admission medications   Medication Sig Start Date End Date Taking? Authorizing Provider  allopurinol  (ZYLOPRIM ) 300 MG tablet TAKE 1 TABLET BY MOUTH DAILY TO PREVENT GOUT 08/05/23  Yes Fargo, Amy E, NP  atorvastatin  (LIPITOR) 40 MG tablet Take 1 tab daily for cholesterol. Patient taking differently: Take 40 mg by mouth in the morning. 06/17/23  Yes Webb, Padonda B, FNP  brimonidine (ALPHAGAN) 0.2 % ophthalmic solution Place 1 drop into both eyes 2 (two) times daily. 03/27/22  Yes [provider]  buPROPion  (WELLBUTRIN  XL) 300 MG 24 hr tablet Take  1 tablet  Daily for Mood, Focus & Concentration                                         /         TAKE                      BY                      MOUTH Patient taking differently: Take 300 mg by mouth in the morning. 12/24/22  Yes Vangie Genet, MD  Cyanocobalamin (VITAMIN B12 PO) Take 1 tablet by mouth daily.   Yes [provider]  finasteride  (PROSCAR ) 5 MG tablet TAKE 1 TABLET BY MOUTH DAILY FOR PROSTATE 03/31/23  Yes Wilkinson, Dana E, FNP  gabapentin  (NEURONTIN ) 300 MG capsule Take  1 capsule  3 x /day for  Pain                                                                                               /                                                                   TAKE  BY                                                 MOUTH Patient taking differently: Take 300 mg by mouth at bedtime. 10/11/22  Yes Vangie Genet, MD  latanoprost  (XALATAN ) 0.005 % ophthalmic solution Place 1 drop into both eyes at bedtime.  04/11/16  Yes [provider]  Magnesium  500 MG TABS Take 1 tablet (500 mg total) by mouth daily. 07/05/21  Yes Gage Bureau, NP  modafinil  (PROVIGIL ) 200 MG tablet Take 1 tablet (200 mg total) by mouth daily. 06/23/23  Yes Fargo, Amy E, NP  mycophenolate  (MYFORTIC ) 360 MG TBEC EC tablet TAKE 1 TABLET BY MOUTH TWICE DAILY 05/06/16  Yes Vangie Genet, MD  NOVOLOG  FLEXPEN 100 UNIT/ML FlexPen Inject 2-3 Units into the skin See admin instructions. Inject 2-3 units into the skin three times a day before meals, per sliding scale   Yes [provider]  pantoprazole  (PROTONIX ) 40 MG tablet Take 40 mg by mouth daily before breakfast.   Yes [provider]  tacrolimus  (PROGRAF ) 0.5 MG capsule Take 0.5 mg by mouth every 12 (twelve) hours.   Yes [provider]  tacrolimus  (PROGRAF ) 1 MG capsule Take 1 mg by mouth every 12 (twelve) hours.   Yes [provider]  tamsulosin  (FLOMAX ) 0.4 MG CAPS capsule Take 1 capsule (0.4 mg total) by mouth daily. Patient taking differently: Take 0.4 mg by mouth at bedtime. 06/23/23  Yes Fargo, Amy E, NP  traZODone  (DESYREL ) 50 MG tablet TAKE ONE TABLET BY MOUTH DAILY ONE HOUR BEFORE BEDTIME AS NEEDED FOR SLEEP Patient taking differently: Take 50 mg by mouth at bedtime. 07/15/23  Yes Fargo, Amy E, NP  valsartan  (DIOVAN ) 80 MG tablet TAKE 1 TABLET BY MOUTH DAILY 08/04/23  Yes Nahser, Lela Purple, MD  warfarin (COUMADIN ) 5 MG tablet Takes 5mg  by mouth daily Monday- Saturday, reduce dose to 2.5 mg every sunday Patient taking  differently: Take 5 mg by mouth in the morning. 06/08/23  Yes Fargo, Amy E, NP  glucose blood (FREESTYLE TEST STRIPS) test strip Check blood sugar 3 to 4 times daily for medication regulation. 12/04/15   Vangie Genet, MD  insulin  NPH-regular Human (NOVOLIN  70/30) (70-30) 100 UNIT/ML injection Inject 18 Units into the skin daily with breakfast AND 10 Units daily with supper. Patient not taking: Reported on 09/11/2023 07/17/23   Arnetha Bhat, NP      Allergies    Losartan, Tape, Morphine , Crestor [rosuvastatin], and Lorazepam    Review of Systems   Review of Systems  Physical Exam Updated Vital Signs BP 137/67 (BP Location: Right Arm)   Pulse (!) 119   Temp 97.9 F (36.6 C) (Oral)   Resp 16   Ht 5' 7.5" (1.715 m)   Wt 77.1 kg   SpO2 100%   BMI 26.23 kg/m  Physical Exam Vitals and nursing note reviewed.  Constitutional:      General: He is not in acute distress.    Appearance: He is well-developed.  HENT:     Head: Normocephalic and atraumatic.     Mouth/Throat:     Mouth: Mucous membranes are moist.     Pharynx: No pharyngeal swelling or oropharyngeal exudate.  Eyes:     Conjunctiva/sclera: Conjunctivae normal.     Pupils: Pupils are equal, round, and  reactive to light.  Cardiovascular:     Rate and Rhythm: Normal rate and regular rhythm.     Pulses: Normal pulses.     Heart sounds: Normal heart sounds. No murmur heard. Pulmonary:     Effort: Pulmonary effort is normal. No respiratory distress.     Breath sounds: Normal breath sounds. No decreased breath sounds, wheezing or rhonchi.  Chest:     Chest wall: No tenderness.  Abdominal:     Palpations: Abdomen is soft.     Tenderness: There is no abdominal tenderness.     Comments: No abdominal distention no tenderness no fluid wave  Musculoskeletal:        General: No swelling. Normal range of motion.     Cervical back: Normal range of motion and neck supple.     Right lower leg: No edema.     Left lower leg: No edema.   Skin:    General: Skin is warm and dry.     Capillary Refill: Capillary refill takes less than 2 seconds.  Neurological:     General: No focal deficit present.     Mental Status: He is alert.  Psychiatric:        Mood and Affect: Mood normal.     ED Results / Procedures / Treatments   Labs (all labs ordered are listed, but only abnormal results are displayed) Labs Reviewed  BASIC METABOLIC PANEL WITH GFR - Abnormal; Notable for the following components:      Result Value   Glucose, Bld 106 (*)    BUN 32 (*)    All other components within normal limits  CBC - Abnormal; Notable for the following components:   RBC 4.05 (*)    Platelets 143 (*)    All other components within normal limits  BRAIN NATRIURETIC PEPTIDE - Abnormal; Notable for the following components:   B Natriuretic Peptide 1,190.5 (*)    All other components within normal limits  PROTIME-INR - Abnormal; Notable for the following components:   Prothrombin Time 30.3 (*)    INR 2.9 (*)    All other components within normal limits  TROPONIN I (HIGH SENSITIVITY) - Abnormal; Notable for the following components:   Troponin I (High Sensitivity) 58 (*)    All other components within normal limits  HEPATIC FUNCTION PANEL  TROPONIN I (HIGH SENSITIVITY)    EKG EKG Interpretation Date/Time:  Thursday September 11 2023 13:34:36 EDT Ventricular Rate:  51 PR Interval:  89 QRS Duration:  154 QT Interval:  452 QTC Calculation: 417 R Axis:   270  Text Interpretation: Sinus rhythm Confirmed by Lowery Rue [phone]) on 09/11/2023 2:39:40 PM  Radiology CT Angio Chest PE W and/or Wo Contrast Result Date: 09/11/2023 CLINICAL DATA:  Pulmonary embolism (PE) suspected, high prob Shortness of breath. EXAM: CT ANGIOGRAPHY CHEST WITH CONTRAST TECHNIQUE: Multidetector CT imaging of the chest was performed using the standard protocol during bolus administration of intravenous contrast. Multiplanar CT image reconstructions and MIPs were  obtained to evaluate the vascular anatomy. RADIATION DOSE REDUCTION: This exam was performed according to the departmental dose-optimization program which includes automated exposure control, adjustment of the mA and/or kV according to patient size and/or use of iterative reconstruction technique. CONTRAST:  75mL OMNIPAQUE  IOHEXOL  350 MG/ML SOLN COMPARISON:  Radiograph earlier today. FINDINGS: Cardiovascular: There are no filling defects within the pulmonary arteries to suggest pulmonary embolus. The main pulmonary artery is dilated at 3.5 cm. Median sternotomy and CABG. Aortic atherosclerosis. The heart  is enlarged. Contrast refluxes into the hepatic veins and IVC. Mediastinum/Nodes: Shotty mediastinal and hilar lymph nodes, likely reactive. Reference right infrahilar node measuring 10 mm series 4, image 65. 11 mm subcarinal node series 4, image 53. unremarkable esophagus. No visible thyroid nodule. Lungs/Pleura: Heterogeneous pulmonary parenchyma with geographic areas of ground-glass. There is mild septal thickening at the apices. Mild central bronchial thickening. No confluent airspace disease. No significant pleural effusion. Upper Abdomen: Contrast refluxes into the hepatic veins and IVC. Gallstones. Nodular contours of the liver raise concern for cirrhosis. Multiple splenic granuloma. Musculoskeletal: Median sternotomy. There are no acute or suspicious osseous abnormalities. Thoracic spondylosis. Review of the MIP images confirms the above findings. IMPRESSION: 1. No pulmonary embolus. 2. Heterogeneous pulmonary parenchyma with geographic areas of ground-glass. Mild septal thickening at the apices. Findings may represent pulmonary edema, inflammatory change or less likely atypical infection. 3. Cardiomegaly. Contrast refluxes into the hepatic veins and IVC, suggesting right heart dysfunction. 4. Dilated main pulmonary artery, can be seen with pulmonary arterial hypertension. 5. Cirrhosis. Cholelithiasis.  Aortic Atherosclerosis (ICD10-I70.0). Electronically Signed   By: Chadwick Colonel M.D.   On: 09/11/2023 16:15   DG Chest 2 View Result Date: 09/11/2023 CLINICAL DATA:  Shortness of breath for the past 2 days. EXAM: CHEST - 2 VIEW COMPARISON:  04/09/2021 FINDINGS: Poor inspiration. Mildly enlarged cardiac silhouette without significant change. Interval small amount of linear atelectasis or scarring in the left mid to lower lung zone. Otherwise, clear lungs with normal vascularity. Stable minimal chronic interstitial prominence. No pleural fluid. Post CABG changes with post median sternotomy fixation. Cervical spine fixation hardware. IMPRESSION: 1. Interval small amount of linear atelectasis or scarring in the left mid to lower lung zone. 2. Stable mild cardiomegaly. 3. No acute abnormality. Electronically Signed   By: Catherin Closs M.D.   On: 09/11/2023 14:19    Procedures Procedures    Medications Ordered in ED Medications  furosemide (LASIX) injection 40 mg (has no administration in time range)  iohexol  (OMNIPAQUE ) 350 MG/ML injection 75 mL (75 mLs Intravenous Contrast Given 09/11/23 1542)    ED Course/ Medical Decision Making/ A&P                                 Medical Decision Making Amount and/or Complexity of Data Reviewed Labs: ordered. Radiology: ordered.  Risk Prescription drug management. Decision regarding hospitalization.   Robert E Nhan Jr. is here with shortness of breath.  Symptoms for the last couple weeks worse when he lies flat worse when he walks.  History of CAD ESRD CKD A-fib on Coumadin .  History of cirrhosis.  Patient mildly tachycardic upon arrival but otherwise normal vitals.  He has been compliant with his Coumadin .  He does not have any signs of volume overload obviously on exam.  No edema.  Does not appear to have ascites.  He has had some congestion.  Differential diagnosis could be ACS versus less likely PE versus may be volume overload versus anemia versus  infectious process.  Will check CBC BMP troponin INR BNP chest x-ray.  EKG shows sinus rhythm.  No obvious ischemic changes.  Patient not having any chest pain currently.  Per my review interpretation labs he does have a troponin of 58 which seems to be his baseline.  BNP is about 1200.  Does not take any diuretics.  Chest x-ray showed no pneumonia.  INR at goal at 2.9.  Overall  got a CT scan of the chest to further evaluate for pneumonia/PE but seems less likely to be this.  I do think that this could be some new heart failure.  He is not on any diuretics.  Could be ACS still as well.  I think he benefit from diuresis echocardiogram and further cardiac workup.  Will admit to hospitalist for further care.  CT scan pending.  CT scan per radiology report shows no pulmonary embolism but does show findings that likely represent pulmonary edema which fits clinically with other labs as well.  Could be pulmonary hypertension as well.  Patient given dose IV Lasix.  Will admit to hospitalist for further care.  This chart was dictated using voice recognition software.  Despite best efforts to proofread,  errors can occur which can change the documentation meaning.         Final Clinical Impression(s) / ED Diagnoses Final diagnoses:  SOB (shortness of breath)  Hypervolemia, unspecified hypervolemia type    Rx / DC Orders ED Discharge Orders     None         Lowery Rue, DO 09/11/23 1619

## 2023-09-11 NOTE — ED Triage Notes (Signed)
 Pt states he has been [facility] the last couple of days. No history of resp issues. No other symptoms.

## 2023-09-12 ENCOUNTER — Other Ambulatory Visit (HOSPITAL_COMMUNITY): Payer: Self-pay

## 2023-09-12 ENCOUNTER — Observation Stay (HOSPITAL_COMMUNITY)

## 2023-09-12 DIAGNOSIS — I5042 Chronic combined systolic (congestive) and diastolic (congestive) heart failure: Secondary | ICD-10-CM | POA: Diagnosis not present

## 2023-09-12 DIAGNOSIS — I5043 Acute on chronic combined systolic (congestive) and diastolic (congestive) heart failure: Secondary | ICD-10-CM | POA: Diagnosis not present

## 2023-09-12 LAB — ECHOCARDIOGRAM COMPLETE
AR max vel: 2.92 cm2
AV Area VTI: 2.62 cm2
AV Area mean vel: 3.02 cm2
AV Mean grad: 9 mmHg
AV Peak grad: 15.9 mmHg
Ao pk vel: 2 m/s
Area-P 1/2: 4.06 cm2
Height: 67.5 in
S' Lateral: 4 cm
Weight: 2680.79 [oz_av]

## 2023-09-12 LAB — BASIC METABOLIC PANEL WITH GFR
Anion gap: 9 (ref 5–15)
BUN: 29 mg/dL — ABNORMAL HIGH (ref 8–23)
CO2: 26 mmol/L (ref 22–32)
Calcium: 10 mg/dL (ref 8.9–10.3)
Chloride: 103 mmol/L (ref 98–111)
Creatinine, Ser: 1.11 mg/dL (ref 0.61–1.24)
GFR, Estimated: >60 mL/min (ref >60)
Glucose, Bld: 131 mg/dL — ABNORMAL HIGH (ref 70–99)
Potassium: 4.3 mmol/L (ref 3.5–5.1)
Sodium: 138 mmol/L (ref 135–145)

## 2023-09-12 LAB — GLUCOSE, CAPILLARY
Glucose-Capillary: 137 mg/dL — ABNORMAL HIGH (ref 70–99)
Glucose-Capillary: 162 mg/dL — ABNORMAL HIGH (ref 70–99)

## 2023-09-12 LAB — CBC
HCT: 40.6 % (ref 39.0–52.0)
Hemoglobin: 13.2 g/dL (ref 13.0–17.0)
MCH: 32.3 pg (ref 26.0–34.0)
MCHC: 32.5 g/dL (ref 30.0–36.0)
MCV: 99.3 fL (ref 80.0–100.0)
Platelets: 133 10*3/uL — ABNORMAL LOW (ref 150–400)
RBC: 4.09 MIL/uL — ABNORMAL LOW (ref 4.22–5.81)
RDW: 13.3 % (ref 11.5–15.5)
WBC: 6.4 10*3/uL (ref 4.0–10.5)
nRBC: 0 % (ref 0.0–0.2)

## 2023-09-12 LAB — PROTIME-INR
INR: 2.3 — ABNORMAL HIGH (ref 0.8–1.2)
Prothrombin Time: 25.7 s — ABNORMAL HIGH (ref 11.4–15.2)

## 2023-09-12 MED ORDER — FUROSEMIDE 20 MG PO TABS
20.0000 mg | ORAL_TABLET | Freq: Every day | ORAL | 0 refills | Status: AC
Start: 2023-09-12 — End: 2023-10-12
  Filled 2023-09-12: qty 30, 30d supply, fill #0

## 2023-09-12 MED ORDER — WARFARIN SODIUM 5 MG PO TABS: 5.0000 mg | ORAL_TABLET | Freq: Every day | ORAL | Status: DC

## 2023-09-12 NOTE — Plan of Care (Signed)

## 2023-09-12 NOTE — Progress Notes (Signed)
  Echocardiogram 2D Echocardiogram has been performed.  Fain Home RDCS 09/12/2023, 1:34 PM

## 2023-09-12 NOTE — Discharge Summary (Signed)
 Physician Discharge Summary  Robert Arnold. NWG:[id number] DOB: 1945-12-23 DOA: 09/11/2023  PCP: Arnetha Bhat, NP  Admit date: 09/11/2023 Discharge date: 09/12/2023 Recommendations for Outpatient Follow-up:  Follow up with PCP in 1 weeks-call for appointment Follow-up with cardiology as arranged Please obtain BMP/CBC in one week  Discharge Dispo: Home Discharge Condition: Stable Code Status:   Code Status: Full Code Diet recommendation:  Diet Order             Diet heart healthy/carb modified Room service appropriate? Yes; Fluid consistency: Thin; Fluid restriction: 1200 mL Fluid  Diet effective now                    Brief/Interim Summary: 78 year old man history of diabetes, BPH, hyperlipidemia, GERD, hypertension, diabetes previous history of CKD liver cirrhosis,, A-fib on Coumadin  presented to the ED with shortness of breath going on for couple of weeks, symptoms worse with laying flat and on walking. Symptoms are worsening for sometime. He had some balance issues. Patient otherwise denies any nausea, vomiting, chest pain,fever, chills, headache, focal weakness, numbness tingling, speech difficulties  In the ED: Vitals tachycardic 119 afebrile on room air.  Labs with BNP 1190 troponin 58> 53 flat stable CBC blood glucose 106 AST ALT in 50s with T. bili 1.5.Chest x-ray no pneumonia, CT scan no PE, has finding of pulmonary edema, Lasix IV was given and admission was requested for further management Patient fluid status improved much felt symptomatically improved after IV Lasix. Echo resulted w/ ef   35-40%-patient clinically improved after IV Lasix.  Discussed with Dr. Arlene Ben from cardiology, at this point since patient clinically improved, plan for discharge home address GDMT as outpatient given patient could not afford Entresto  on last cardiology encounter  Subjective: Seen and examined Overnight BP stable on room air Labs remained stable INR 2.3. Weight down to 167 5 from  170 on admission  Discharge diagnosis   Acute on chronic combined CHF CT chest with concern for pulmonary edema but no PE.Followed by cardiology as outpatient, EF from January 23 with 45-50% In cardiology note from 2024 it seems she was placed on Entresto  but unable to afford  Echo obtained shows EF 35-40% moderately decreased function with global hypokinesia diastolic parameters normal LA moderately dilated trivial MR aortic valve tricuspid moderate calcification, global hypokinesia worse in the posterior lateral wall and septum. Will continue on diuresis PO.  Volume status improved patient is eager to go home, discussed with cardiology,, no need for inpatient consult they have arranged outpatient follow-up and continue Lasix at home CHF instruction provided to the patient.  History of liver cirrhosis with mild transaminitis: Liver cirrhosis appears compensated with mild transaminitis element stable   CAD status post stenting June 20,013 and CABG in 2013 Elevated troponin: Troponins elevated but flat without ischemic changes/delta changes.  Continue Coumadin .  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   Hyperlipidemia: Continue statin   Diabetes: Blood sugar well-controlled, cont ssi,    BPH: Cont home Proscar  and Flomax    Discharge Exam: Vitals:   09/12/23 0425 09/12/23 0900  BP: 134/69 137/84  Pulse: 69 79  Resp: 20   Temp: 98.6 F (37 C) 98.7 F (37.1 C)  SpO2: 93% 94%   General: Pt is alert, awake, not in acute distress Cardiovascular: RRR, S1/S2 +,  no rubs, no gallops Respiratory: CTA bilaterally, no wheezing, no rhonchi Abdominal: Soft, NT, ND, bowel sounds + Extremities: no edema, no cyanosis  Discharge Instructions  Discharge Instructions     (HEART FAILURE PATIENTS) Call MD:   Anytime you have any of the following symptoms: 1) 3 pound weight gain in 24 hours or 5 pounds in 1 week 2) shortness of breath, with or without a dry hacking cough 3) swelling in the hands, feet or stomach 4) if you have to sleep on extra pillows at night in order to breathe.   Complete by: As directed    Discharge instructions   Complete by: As directed    Please call call MD or return to ER for similar or worsening recurring problem that brought you to hospital or if any fever,nausea/vomiting,abdominal pain, uncontrolled pain, chest pain,  shortness of breath or any other alarming symptoms.  Please follow-up your doctor as instructed in a week time and call the office for appointment.  Please avoid alcohol, smoking, or any other illicit substance and maintain healthy habits including taking your regular medications as prescribed.  You were cared for by a hospitalist during your hospital stay. If you have any questions about your discharge medications or the care you received while you were in the hospital after you are discharged, you can call the unit and ask to speak with the hospitalist on call if the hospitalist that took care of you is not available.  Once you are discharged, your primary care physician will handle any further medical issues. Please note that NO REFILLS for any discharge medications will be authorized once you are discharged, as it is imperative that you return to your primary care physician (or establish a relationship with a primary care physician if you do not have one) for your aftercare needs so that they can reassess your need for medications and monitor your lab values   Increase activity slowly   Complete by: As directed       Allergies as of 09/12/2023       Reactions   Losartan Anaphylaxis   Tape Other (See Comments)   SKIN IS VERY DELICATE AND WILL TEAR AND BRUISE EASILY!!!   Morphine  Other (See Comments)   This is INEFFECTIVE   Crestor [rosuvastatin] Other  (See Comments)   Elevated LFT's   Lorazepam Other (See Comments)   Pt is unsure of reaction         Medication List     TAKE these medications    allopurinol  300 MG tablet Commonly known as: ZYLOPRIM  TAKE 1 TABLET BY MOUTH DAILY TO PREVENT GOUT   atorvastatin  40 MG tablet Commonly known as: Lipitor Take 1 tab daily for cholesterol. What changed:  how much to take how to take this when to take this additional instructions   brimonidine 0.2 % ophthalmic solution Commonly known as: ALPHAGAN Place 1 drop into both eyes 2 (two) times daily.   buPROPion  300 MG 24 hr tablet Commonly known as: WELLBUTRIN  XL Take  1 tablet  Daily for Mood, Focus & Concentration                                         /         TAKE  BY                      MOUTH What changed:  how much to take how to take this when to take this additional instructions   finasteride  5 MG tablet Commonly known as: PROSCAR  TAKE 1 TABLET BY MOUTH DAILY FOR PROSTATE   furosemide 20 MG tablet Commonly known as: Lasix Take 1 tablet (20 mg total) by mouth daily.   gabapentin  300 MG capsule Commonly known as: NEURONTIN  Take  1 capsule  3 x /day for  Pain                                                                                              /                                                                   TAKE                                         BY                                                 MOUTH What changed:  how much to take how to take this when to take this additional instructions   glucose blood test strip Commonly known as: FREESTYLE TEST STRIPS Check blood sugar 3 to 4 times daily for medication regulation.   latanoprost  0.005 % ophthalmic solution Commonly known as: XALATAN  Place 1 drop into both eyes at bedtime.   Magnesium  500 MG Tabs Take 1 tablet (500 mg total) by mouth daily.   modafinil  200 MG tablet Commonly known as: Provigil  Take 1 tablet (200 mg  total) by mouth daily.   mycophenolate  360 MG Tbec EC tablet Commonly known as: MYFORTIC  TAKE 1 TABLET BY MOUTH TWICE DAILY   NovoLOG  FlexPen 100 UNIT/ML FlexPen Generic drug: insulin  aspart Inject 2-3 Units into the skin See admin instructions. Inject 2-3 units into the skin three times a day before meals, per sliding scale   pantoprazole  40 MG tablet Commonly known as: PROTONIX  Take 40 mg by mouth daily before breakfast.   tacrolimus  1 MG capsule Commonly known as: PROGRAF  Take 1 mg by mouth every 12 (twelve) hours.   tacrolimus  0.5 MG capsule Commonly known as: PROGRAF  Take 0.5 mg by mouth every 12 (twelve) hours.   tamsulosin  0.4 MG Caps capsule Commonly known as: FLOMAX  Take 1 capsule (0.4 mg total) by mouth daily. What changed: when to take this   traZODone  50 MG tablet Commonly known as: DESYREL  TAKE ONE TABLET BY MOUTH DAILY ONE HOUR BEFORE BEDTIME AS  NEEDED FOR SLEEP What changed:  how much to take how to take this when to take this additional instructions   valsartan  80 MG tablet Commonly known as: DIOVAN  TAKE 1 TABLET BY MOUTH DAILY   VITAMIN B12 PO Take 1 tablet by mouth daily.   warfarin 5 MG tablet Commonly known as: COUMADIN  Takes 5mg  by mouth daily Monday- Saturday, reduce dose to 2.5 mg every "sunday What changed:  how much to take how to take this when to take this additional instructions        Follow-up Information     Fargo, Amy E, NP Follow up in 1 week(s).   Specialty: Adult Health Nurse Practitioner Contact information: [address] [place] [address] [phone]                Allergies  Allergen Reactions   Losartan Anaphylaxis   Tape Other (See Comments)    SKIN IS VERY DELICATE AND WILL TEAR AND BRUISE EASILY!!!   Morphine Other (See Comments)    This is INEFFECTIVE   Crestor [Rosuvastatin] Other (See Comments)    Elevated LFT's   Lorazepam Other (See Comments)    Pt is unsure of reaction     The  results of significant diagnostics from this hospitalization (including imaging, microbiology, ancillary and laboratory) are listed below for reference.    Microbiology: No results found for this or any previous visit (from the past 240 hours).  Procedures/Studies: ECHOCARDIOGRAM COMPLETE Result Date: 09/12/2023    ECHOCARDIOGRAM REPORT   Patient Name:   Robert E Blumberg Jr. Date of Exam: 09/12/2023 Medical Rec #:  [id number]           Height:       67.5 in Accession #:    [id number]          Weight:       167.5 lb Date of Birth:  10/11/1945          BSA:          1.886 m Patient Age:    77 years            BP:           134/69 mmHg Patient Gender: M                   HR:           62"  bpm. Exam Location:  Inpatient Procedure: 2D Echo, Cardiac Doppler and Color Doppler (Both Spectral and Color            Flow Doppler were utilized during procedure). Indications:    CHF I50.9  History:        Patient has prior history of Echocardiogram examinations, most                 recent 04/10/2021.  Sonographer:    Hersey Lorenzo RDCS Referring Phys: [id number] Emrie Gayle IMPRESSIONS  1. Global hypokinesis worse in the posterior lateral wall and septum. Left ventricular ejection fraction, by estimation, is 35 to 40%. The left ventricle has moderately decreased function. The left ventricle demonstrates global hypokinesis. The left ventricular internal cavity size was moderately dilated. There is moderate left ventricular hypertrophy. Left ventricular diastolic parameters were normal.  2. Right ventricular systolic function is normal. The right ventricular size is normal.  3. Left atrial size was moderately dilated.  4. The mitral valve is abnormal. Trivial mitral valve regurgitation. No evidence of mitral stenosis.  5. Calcified mean  gradient 6 peak 9 mmHg but AVA 2.5 cm2. The aortic valve is tricuspid. There is moderate calcification of the aortic valve. There is moderate thickening of the aortic valve. Aortic valve regurgitation is  trivial. Aortic valve sclerosis/calcification is present, without any evidence of aortic stenosis.  6. The inferior vena cava is normal in size with greater than 50% respiratory variability, suggesting right atrial pressure of 3 mmHg. FINDINGS  Left Ventricle: Global hypokinesis worse in the posterior lateral wall and septum. Left ventricular ejection fraction, by estimation, is 35 to 40%. The left ventricle has moderately decreased function. The left ventricle demonstrates global hypokinesis.  Strain was performed and the global longitudinal strain is indeterminate. The left ventricular internal cavity size was moderately dilated. There is moderate left ventricular hypertrophy. Left ventricular diastolic parameters were normal. Right Ventricle: The right ventricular size is normal. No increase in right ventricular wall thickness. Right ventricular systolic function is normal. Left Atrium: Left atrial size was moderately dilated. Right Atrium: Right atrial size was normal in size. Pericardium: There is no evidence of pericardial effusion. Mitral Valve: The mitral valve is abnormal. There is mild thickening of the mitral valve leaflet(s). There is mild calcification of the mitral valve leaflet(s). Mild mitral annular calcification. Trivial mitral valve regurgitation. No evidence of mitral valve stenosis. Tricuspid Valve: The tricuspid valve is normal in structure. Tricuspid valve regurgitation is trivial. No evidence of tricuspid stenosis. Aortic Valve: Calcified mean gradient 6 peak 9 mmHg but AVA 2.5 cm2. The aortic valve is tricuspid. There is moderate calcification of the aortic valve. There is moderate thickening of the aortic valve. Aortic valve regurgitation is trivial. Aortic valve  sclerosis/calcification is present, without any evidence of aortic stenosis. Aortic valve mean gradient measures 9.0 mmHg. Aortic valve peak gradient measures 15.9 mmHg. Aortic valve area, by VTI measures 2.62 cm. Pulmonic Valve:  The pulmonic valve was normal in structure. Pulmonic valve regurgitation is not visualized. No evidence of pulmonic stenosis. Aorta: The aortic root is normal in size and structure. Venous: The inferior vena cava is normal in size with greater than 50% respiratory variability, suggesting right atrial pressure of 3 mmHg. IAS/Shunts: No atrial level shunt detected by color flow Doppler. Additional Comments: 3D was performed not requiring image post processing on an independent workstation and was indeterminate.  LEFT VENTRICLE PLAX 2D LVIDd:         5.50 cm   Diastology LVIDs:         4.00 cm   LV e' lateral:   8.67 cm/s LV PW:         1.30 cm   LV E/e' lateral: 11.3 LV IVS:        1.30 cm LVOT diam:     2.20 cm LV SV:         101 LV SV Index:   54 LVOT Area:     3.80 cm  RIGHT VENTRICLE            IVC RV S prime:     6.41 cm/s  IVC diam: 2.10 cm TAPSE (M-mode): 1.2 cm LEFT ATRIUM             Index LA diam:        4.40 cm 2.33 cm/m LA Vol (A2C):   62.3 ml 33.03 ml/m LA Vol (A4C):   71.2 ml 37.75 ml/m LA Biplane Vol: 72.8 ml 38.60 ml/m  AORTIC VALVE AV Area (Vmax):    2.92 cm AV Area (Vmean):   3.02  cm AV Area (VTI):     2.62 cm AV Vmax:           199.50 cm/s AV Vmean:          141.000 cm/s AV VTI:            0.387 m AV Peak Grad:      15.9 mmHg AV Mean Grad:      9.0 mmHg LVOT Vmax:         153.00 cm/s LVOT Vmean:        112.000 cm/s LVOT VTI:          0.267 m LVOT/AV VTI ratio: 0.69  AORTA Ao Root diam: 3.40 cm Ao Asc diam:  3.50 cm MITRAL VALVE MV Area (PHT): 4.06 cm    SHUNTS MV Decel Time: 187 msec    Systemic VTI:  0.27 m MV E velocity: 98.10 cm/s  Systemic Diam: 2.20 cm MV A velocity: 91.30 cm/s MV E/A ratio:  1.07 Janelle Mediate MD Electronically signed by Janelle Mediate MD Signature Date/Time: 09/12/2023/1:58:20 PM    Final    CT Angio Chest PE W and/or Wo Contrast Result Date: 09/11/2023 CLINICAL DATA:  Pulmonary embolism (PE) suspected, high prob Shortness of breath. EXAM: CT ANGIOGRAPHY CHEST WITH  CONTRAST TECHNIQUE: Multidetector CT imaging of the chest was performed using the standard protocol during bolus administration of intravenous contrast. Multiplanar CT image reconstructions and MIPs were obtained to evaluate the vascular anatomy. RADIATION DOSE REDUCTION: This exam was performed according to the departmental dose-optimization program which includes automated exposure control, adjustment of the mA and/or kV according to patient size and/or use of iterative reconstruction technique. CONTRAST:  75mL OMNIPAQUE  IOHEXOL  350 MG/ML SOLN COMPARISON:  Radiograph earlier today. FINDINGS: Cardiovascular: There are no filling defects within the pulmonary arteries to suggest pulmonary embolus. The main pulmonary artery is dilated at 3.5 cm. Median sternotomy and CABG. Aortic atherosclerosis. The heart is enlarged. Contrast refluxes into the hepatic veins and IVC. Mediastinum/Nodes: Shotty mediastinal and hilar lymph nodes, likely reactive. Reference right infrahilar node measuring 10 mm series 4, image 65. 11 mm subcarinal node series 4, image 53. unremarkable esophagus. No visible thyroid nodule. Lungs/Pleura: Heterogeneous pulmonary parenchyma with geographic areas of ground-glass. There is mild septal thickening at the apices. Mild central bronchial thickening. No confluent airspace disease. No significant pleural effusion. Upper Abdomen: Contrast refluxes into the hepatic veins and IVC. Gallstones. Nodular contours of the liver raise concern for cirrhosis. Multiple splenic granuloma. Musculoskeletal: Median sternotomy. There are no acute or suspicious osseous abnormalities. Thoracic spondylosis. Review of the MIP images confirms the above findings. IMPRESSION: 1. No pulmonary embolus. 2. Heterogeneous pulmonary parenchyma with geographic areas of ground-glass. Mild septal thickening at the apices. Findings may represent pulmonary edema, inflammatory change or less likely atypical infection. 3. Cardiomegaly.  Contrast refluxes into the hepatic veins and IVC, suggesting right heart dysfunction. 4. Dilated main pulmonary artery, can be seen with pulmonary arterial hypertension. 5. Cirrhosis. Cholelithiasis. Aortic Atherosclerosis (ICD10-I70.0). Electronically Signed   By: Chadwick Colonel M.D.   On: 09/11/2023 16:15   DG Chest 2 View Result Date: 09/11/2023 CLINICAL DATA:  Shortness of breath for the past 2 days. EXAM: CHEST - 2 VIEW COMPARISON:  04/09/2021 FINDINGS: Poor inspiration. Mildly enlarged cardiac silhouette without significant change. Interval small amount of linear atelectasis or scarring in the left mid to lower lung zone. Otherwise, clear lungs with normal vascularity. Stable minimal chronic interstitial prominence. No pleural fluid. Post CABG changes with post  median sternotomy fixation. Cervical spine fixation hardware. IMPRESSION: 1. Interval small amount of linear atelectasis or scarring in the left mid to lower lung zone. 2. Stable mild cardiomegaly. 3. No acute abnormality. Electronically Signed   By: Catherin Closs M.D.   On: 09/11/2023 14:19   VAS US  CAROTID Result Date: 08/28/2023 Carotid Arterial Duplex Study Patient Name:  Robert Arnold.  Date of Exam:   08/26/2023 Medical Rec #: [id number]            Accession #:    [id number] Date of Birth: 07-10-1945           Patient Gender: M Patient Age:   34 years Exam Location:  [address] Procedure:      VAS US  CAROTID Referring Phys: Ahmad Alert --------------------------------------------------------------------------------  Indications:   Carotid artery disease follow-up. Patient denies any                cerebrovascular symptoms at this time. Risk Factors:  Hypertension, hyperlipidemia, Diabetes, past history of smoking,                coronary artery disease. Other Factors: CHF. Performing Technologist: Harless Lien RVT  Examination Guidelines: A complete evaluation includes B-mode imaging, spectral Doppler, color Doppler, and power  Doppler as needed of all accessible portions of each vessel. Bilateral testing is considered an integral part of a complete examination. Limited examinations for reoccurring indications may be performed as noted.  Right Carotid Findings: +----------+--------+--------+--------+---------------------+--------+           PSV cm/sEDV cm/sStenosisPlaque Description   Comments +----------+--------+--------+--------+---------------------+--------+ CCA Prox  110     13                                            +----------+--------+--------+--------+---------------------+--------+ CCA Distal58      6       <50%                                  +----------+--------+--------+--------+---------------------+--------+ ICA Prox  56      11      1-39%   focal and hyperechoic         +----------+--------+--------+--------+---------------------+--------+ ICA Mid   69      12                                            +----------+--------+--------+--------+---------------------+--------+ ICA Distal76      17                                            +----------+--------+--------+--------+---------------------+--------+ ECA       75      1                                             +----------+--------+--------+--------+---------------------+--------+ +----------+--------+-------+----------------+-------------------+           PSV cm/sEDV cmsDescribe        Arm Pressure (mmHG) +----------+--------+-------+----------------+-------------------+ [id number]  Multiphasic, [id number]                 +----------+--------+-------+----------------+-------------------+ +---------+--------+--+--------+--+---------+ VertebralPSV cm/s84EDV cm/s16Antegrade +---------+--------+--+--------+--+---------+  Left Carotid Findings: +----------+--------+--------+--------+------------------+--------+           PSV cm/sEDV cm/sStenosisPlaque DescriptionComments  +----------+--------+--------+--------+------------------+--------+ CCA Prox  115     11                                         +----------+--------+--------+--------+------------------+--------+ CCA Distal67      9       <50%                               +----------+--------+--------+--------+------------------+--------+ ICA Prox  71      16      1-39%   heterogenous               +----------+--------+--------+--------+------------------+--------+ ICA Mid   81      17                                         +----------+--------+--------+--------+------------------+--------+ ICA Distal96      21                                         +----------+--------+--------+--------+------------------+--------+ ECA       166     166                                        +----------+--------+--------+--------+------------------+--------+ +----------+--------+--------+---------+-------------------+           PSV cm/sEDV cm/sDescribe Arm Pressure (mmHG) +----------+--------+--------+---------+-------------------+ Subclavian250     55      Turbulent                    +----------+--------+--------+---------+-------------------+ +---------+--------+--+--------+-+---------+ VertebralPSV cm/s44EDV cm/s9Antegrade +---------+--------+--+--------+-+---------+ Subclavian artery demonstrates increased end diastolic flow and turbulence consistent with fistula. Left arm BP not obtained due to presence of AV fistula.  Summary: Right Carotid: Velocities in the right ICA are consistent with a 1-39% stenosis.                Non-hemodynamically significant plaque <50% noted in the CCA. Left Carotid: Velocities in the left ICA are consistent with a 1-39% stenosis.               Non-hemodynamically significant plaque <50% noted in the CCA. The               ECA appears >50% stenosed. Vertebrals:  Bilateral vertebral arteries demonstrate antegrade flow. Subclavians: Normal flow  hemodynamics were seen in the right subclavian artery.              Elevated velocities and turbulent flow noted in the left subclavian              artery due to left arm fistula. *See table(s) above for measurements and observations.  Electronically signed by Lauro Portal MD on 08/28/2023 at 12:59:06 PM.    Final     Labs: BNP (last 3 results) Recent Labs    09/11/23 1338  BNP 1,190.5*   Basic Metabolic Panel: Recent Labs  Lab 09/11/23 1338 09/12/23 0340  NA 135 138  K 4.5 4.3  CL 103 103  CO2 25 26  GLUCOSE 106* 131*  BUN 32* 29*  CREATININE 1.04 1.11  CALCIUM  9.5 10.0   Liver Function Tests: Recent Labs  Lab 09/11/23 1501  AST 52*  ALT 51*  ALKPHOS 138*  BILITOT 1.5*  PROT 6.7  ALBUMIN 3.5   No results for input(s): "LIPASE", "AMYLASE" in the last 168 hours. No results for input(s): "AMMONIA" in the last 168 hours. CBC: Recent Labs  Lab 09/11/23 1338 09/12/23 0340  WBC 7.0 6.4  HGB 13.1 13.2  HCT 40.4 40.6  MCV 99.8 99.3  PLT 143* 133*  CBG: Recent Labs  Lab 09/11/23 2102 09/12/23 0747 09/12/23 1125  GLUCAP 150* 137* 162*  Anemia work up No results for input(s): "VITAMINB12", "FOLATE", "FERRITIN", "TIBC", "IRON", "RETICCTPCT" in the last 72 hours. Urinalysis    Component Value Date/Time   COLORURINE YELLOW 07/04/2021 0931   APPEARANCEUR CLEAR 07/04/2021 0931   LABSPEC 1.017 07/04/2021 0931   PHURINE 5.5 07/04/2021 0931   GLUCOSEU NEGATIVE 07/04/2021 0931   HGBUR NEGATIVE 07/04/2021 0931   BILIRUBINUR NEGATIVE 04/03/2021 2205   KETONESUR NEGATIVE 07/04/2021 0931   PROTEINUR NEGATIVE 07/04/2021 0931   UROBILINOGEN 0.2 09/06/2013 1706   NITRITE NEGATIVE 07/04/2021 0931   LEUKOCYTESUR NEGATIVE 07/04/2021 0931  Sepsis Labs Recent Labs  Lab 09/11/23 1338 09/12/23 0340  WBC 7.0 6.4  Microbiology No results found for this or any previous visit (from the past 240 hours). Time coordinating discharge: 25 minutes  SIGNED: Lesa Rape,  MD  [facility] 09/12/2023, 3:00 PM  If 7PM-7AM, please contact night-coverage [email or web address]

## 2023-09-12 NOTE — Progress Notes (Signed)
 PHARMACY - ANTICOAGULATION CONSULT NOTE  Pharmacy Consult for Warfarin Indication: atrial fibrillation  Allergies  Allergen Reactions   Losartan Anaphylaxis   Tape Other (See Comments)    SKIN IS VERY DELICATE AND WILL TEAR AND BRUISE EASILY!!!   Morphine  Other (See Comments)    This is INEFFECTIVE   Crestor [Rosuvastatin] Other (See Comments)    Elevated LFT's   Lorazepam Other (See Comments)    Pt is unsure of reaction     Patient Measurements: Height: 5' 7.5" (171.5 cm) Weight: 76 kg (167 lb 8.8 oz) IBW/kg (Calculated) : 67.25 HEPARIN  DW (KG): 77.1  Vital Signs: Temp: 98.6 F (37 C) (06/06 0425) Temp Source: Oral (06/06 0425) BP: 134/69 (06/06 0425) Pulse Rate: 69 (06/06 0425)  Labs: Recent Labs    09/11/23 1338 09/11/23 1501 09/12/23 0340  HGB 13.1  --  13.2  HCT 40.4  --  40.6  PLT 143*  --  133*  LABPROT  --  30.3* 25.7*  INR  --  2.9* 2.3*  CREATININE 1.04  --  1.11  TROPONINIHS 58* 53*  --     Estimated Creatinine Clearance: 54.7 mL/min (by C-G formula based on SCr of 1.11 mg/dL).   Medical History: Past Medical History:  Diagnosis Date   Adenomatous colon polyp    Allergy    BPH (benign prostatic hyperplasia)    CAD (coronary artery disease)    a. NSTEMI 09/2011 s/p PCI to LAD. b. CABG 01/2012 @ [facility].   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 CT neg for PE - CBC remains WNL. Plts 133  - INR 2.9>2.3 today  Goal of Therapy:  INR 2-3 Monitor platelets by anticoagulation protocol: Yes   Plan:  Warfarin 5mg  po daily Daily INR   Khiyan Crace Darcel Early, PharmD, BCPS Clinical Staff Pharmacist Enis Harsh Stillinger 09/12/2023,7:30 AM

## 2023-09-12 NOTE — Progress Notes (Signed)
 Heart Failure Navigator Progress Note  Assessed for Heart & Vascular TOC clinic readiness.  Patient does not meet criteria due to has a scheduled [facility] appointment on 09/25/2023. .   Navigator will sign off at this time.   Randie Bustle, BSN, Scientist, clinical (histocompatibility and immunogenetics) Only

## 2023-09-12 NOTE — Progress Notes (Signed)
 Discharge instructions given to patient and wife, questions asked and answered. Discharge medications delivered to patient at bedside [facility]

## 2023-09-12 NOTE — Progress Notes (Signed)
 Mobility Specialist - Progress Note   09/12/23 1038  Mobility  Activity Transferred from bed to chair  Level of Assistance Independent after set-up  Assistive Device None  Distance Ambulated (ft) 10 ft  Range of Motion/Exercises Active  Activity Response Tolerated well  Mobility Referral Yes  Mobility visit 1 Mobility  Mobility Specialist Start Time (ACUTE ONLY) 1030  Mobility Specialist Stop Time (ACUTE ONLY) 1038  Mobility Specialist Time Calculation (min) (ACUTE ONLY) 8 min   Pt was found in bed and agreeable to mobilize. Agreeable to transfer to recliner chair and was left with all needs met. Call bell in reach.  Lorna Rose,  Mobility Specialist Can be reached via Secure Chat

## 2023-09-12 NOTE — TOC Initial Note (Signed)
 Transition of Care (TOC) - Initial/Assessment Note   Patient Details  Name: Robert Arnold. MRN: [id number] Date of Birth: [birth year]  Transition of Care [facility]) CM/SW Contact:    Zenon Hilda, LCSW Phone Number: 09/12/2023, 9:08 AM  Clinical Narrative: [facility] consulted for heart failure screening. Patient has already been referred to the heart failure navigation team, so consult will be cleared at this time.  Expected Discharge Plan: Home/Self Care Barriers to Discharge: Continued Medical Work up  Patient Goals and CMS Choice Choice offered to / list presented to : NA  Expected Discharge Plan and Services In-house Referral: Clinical Social Work Post Acute Care Choice: NA Living arrangements for the past 2 months: Single Family Home             DME Arranged: N/A DME Agency: NA  Prior Living Arrangements/Services Living arrangements for the past 2 months: Single Family Home Lives with:: Spouse Patient language and need for interpreter reviewed:: Yes Do you feel safe going back to the place where you live?: Yes      Need for Family Participation in Patient Care: No (Comment) Care giver support system in place?: Yes (comment) Criminal Activity/Legal Involvement Pertinent to Current Situation/Hospitalization: No - Comment as needed  Activities of Daily Living ADL Screening (condition at time of admission) Independently performs ADLs?: Yes (appropriate for developmental age) Is the patient deaf or have difficulty hearing?: No Does the patient have difficulty seeing, even when wearing glasses/contacts?: No Does the patient have difficulty concentrating, remembering, or making decisions?: No  Emotional Assessment Alcohol / Substance Use: Not Applicable Psych Involvement: No (comment)  Admission diagnosis:  SOB (shortness of breath) [R06.02] Congestive heart failure (CHF) (HCC) [I50.9] Hypervolemia, unspecified hypervolemia type [E87.70] Patient Active Problem List   Diagnosis  Date Noted   Congestive heart failure (CHF) (HCC) 09/11/2023   Acquired dilation of ascending aorta and aortic root (HCC) 04/17/2021   B12 deficiency 05/17/2020   History of skin cancer 05/16/2020   First degree AV block 05/12/2020   A-V fistula (HCC) 09/15/2019   Mobitz I 04/26/2019   Chronic hip pain, bilateral 09/03/2018   S/P cervical spinal fusion 05/23/2018   HNP (herniated nucleus pulposus), cervical 05/04/2018   Diabetes mellitus type 2, controlled (HCC) 02/27/2018   Former smoker (quit 1992) 02/27/2018   Senile purpura (HCC) 01/23/2018   Overweight (BMI 25.0-29.9) 08/28/2017   Essential tremor 04/03/2017   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   CAD (coronary artery disease) of artery bypass graft 01/11/2016   Thrombocytopenia (HCC) 09/26/2015   Depression, major, 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   Renal Transplant, s/p 09/2011 11/26/2013   Vitamin D  deficiency 08/17/2013   Anticoagulant long-term use 08/17/2013   CKD stage 2 due to type 2 diabetes mellitus (HCC)    Hyperlipidemia associated with type 2 diabetes mellitus (HCC)    BPH with obstruction/lower urinary tract symptoms    Gout    Paroxysmal atrial fibrillation (HCC)    Essential hypertension    Chronic combined systolic and diastolic CHF (congestive heart failure) (HCC) 11/30/2011   PCP:  Arnetha Bhat, NP Pharmacy:   [facility] [id number] - [place], [place] - [address] [place] [address] Phone: [phone] Fax: [phone]  Social Drivers of Health (SDOH) Social History: SDOH Screenings   Food Insecurity: No Food Insecurity (09/11/2023)  Housing: Low Risk  (09/11/2023)  Transportation Needs: No Transportation Needs (09/11/2023)  Utilities: Not At Risk (09/11/2023)  Alcohol Screen: Low Risk  (06/25/2023)  Depression (PHQ2-9): Low Risk  (06/25/2023)  Financial Resource Strain: Low Risk  (06/25/2023)  Physical Activity: Unknown (06/25/2023)  Social Connections: Moderately Integrated (09/11/2023)   Stress: Stress Concern Present (06/25/2023)  Tobacco Use: Medium Risk (09/11/2023)   SDOH Interventions:    Readmission Risk Interventions     No data to display

## 2023-09-14 ENCOUNTER — Other Ambulatory Visit: Payer: Self-pay | Admitting: Family

## 2023-09-14 DIAGNOSIS — E1169 Type 2 diabetes mellitus with other specified complication: Secondary | ICD-10-CM

## 2023-09-22 ENCOUNTER — Emergency Department (HOSPITAL_COMMUNITY)

## 2023-09-22 ENCOUNTER — Encounter (HOSPITAL_COMMUNITY): Payer: Self-pay

## 2023-09-22 ENCOUNTER — Emergency Department (HOSPITAL_COMMUNITY): Admission: EM | Admit: 2023-09-22 | Discharge: 2023-09-22 | Disposition: A | Discharge status: Home / Self Care

## 2023-09-22 DIAGNOSIS — R0602 Shortness of breath: Secondary | ICD-10-CM | POA: Diagnosis present

## 2023-09-22 DIAGNOSIS — I509 Heart failure, unspecified: Secondary | ICD-10-CM | POA: Insufficient documentation

## 2023-09-22 LAB — BASIC METABOLIC PANEL WITH GFR
Anion gap: 7 (ref 5–15)
BUN: 34 mg/dL — ABNORMAL HIGH (ref 8–23)
CO2: 28 mmol/L (ref 22–32)
Calcium: 9.7 mg/dL (ref 8.9–10.3)
Chloride: 103 mmol/L (ref 98–111)
Creatinine, Ser: 1.12 mg/dL (ref 0.61–1.24)
GFR, Estimated: >60 mL/min (ref >60)
Glucose, Bld: 173 mg/dL — ABNORMAL HIGH (ref 70–99)
Potassium: 4.9 mmol/L (ref 3.5–5.1)
Sodium: 138 mmol/L (ref 135–145)

## 2023-09-22 LAB — CBC
HCT: 43.7 % (ref 39.0–52.0)
Hemoglobin: 13.9 g/dL (ref 13.0–17.0)
MCH: 32 pg (ref 26.0–34.0)
MCHC: 31.8 g/dL (ref 30.0–36.0)
MCV: 100.7 fL — ABNORMAL HIGH (ref 80.0–100.0)
Platelets: 143 10*3/uL — ABNORMAL LOW (ref 150–400)
RBC: 4.34 MIL/uL (ref 4.22–5.81)
RDW: 13.3 % (ref 11.5–15.5)
WBC: 8.2 10*3/uL (ref 4.0–10.5)
nRBC: 0 % (ref 0.0–0.2)

## 2023-09-22 LAB — BRAIN NATRIURETIC PEPTIDE: B Natriuretic Peptide: 947.1 pg/mL — ABNORMAL HIGH (ref 0.0–100.0)

## 2023-09-22 LAB — TROPONIN I (HIGH SENSITIVITY)
Troponin I (High Sensitivity): 61 ng/L — ABNORMAL HIGH (ref <18)
Troponin I (High Sensitivity): 61 ng/L — ABNORMAL HIGH (ref <18)

## 2023-09-22 MED ORDER — FUROSEMIDE 40 MG PO TABS
40.0000 mg | ORAL_TABLET | Freq: Every day | ORAL | 0 refills | Status: DC
Start: 2023-09-22 — End: 2023-09-25

## 2023-09-22 MED ORDER — FUROSEMIDE 10 MG/ML IJ SOLN
40.0000 mg | Freq: Once | INTRAMUSCULAR | Status: AC
  Filled 2023-09-22: qty 4

## 2023-09-22 NOTE — ED Notes (Signed)
 During first IV attempt, the IV catheter went through the FA vein and caused a hematoma. Bruising noted, ice pack applied. Pt denied pain. Second IV placed in [facility] and was successful.Robert Arnold

## 2023-09-22 NOTE — ED Triage Notes (Signed)
 Pt c/o continued SOB.  Pt reports he was recently admitted for same but symptoms have gotten worse.  Pt reports taking Lasix  w/o relief.  Denies other complaints. Denies extremity swelling.   Pt is easily speaking full sentences, but sts SOB gets worse when he lays down.

## 2023-09-22 NOTE — Discharge Instructions (Signed)
 Take 40 mg of Lasix  daily.  Follow-up with your primary care doctor and your cardiologist.  Return to the ER for new or worsening symptoms.

## 2023-09-22 NOTE — ED Provider Notes (Signed)
  EMERGENCY DEPARTMENT AT [facility] Provider Note   CSN: [id number] Arrival date & time: 09/22/23  1218     Patient presents with: Shortness of Breath   Robert Arnold. is a 78 y.o. male.   78 year old male presents for evaluation of shortness of breath.  States he was recently admitted for fluid on his lungs and his heart.  He states he was started on Lasix  at home and has only had a slight increase in urination but is not noticed leg swelling.  States the last few nights has been unable to lay flat at home.  He is on Lasix  20 mg daily.  He denies chest pain or any other symptoms or concerns at this time.  Has mild dyspnea on exertion.   Shortness of Breath Associated symptoms: no abdominal pain, no chest pain, no cough, no ear pain, no fever, no rash, no sore throat and no vomiting        Prior to Admission medications   Medication Sig Start Date End Date Taking? Authorizing Provider  furosemide  (LASIX ) 40 MG tablet Take 1 tablet (40 mg total) by mouth daily. 09/22/23 10/22/23 Yes Marleta Lapierre L, DO  allopurinol  (ZYLOPRIM ) 300 MG tablet TAKE 1 TABLET BY MOUTH DAILY TO PREVENT GOUT 08/05/23   Fargo, Amy E, NP  atorvastatin  (LIPITOR) 40 MG tablet Take 1 tab daily for cholesterol. Patient taking differently: Take 40 mg by mouth in the morning. 06/17/23   Webb, Padonda B, FNP  brimonidine  (ALPHAGAN ) 0.2 % ophthalmic solution Place 1 drop into both eyes 2 (two) times daily. 03/27/22   [provider]  buPROPion  (WELLBUTRIN  XL) 300 MG 24 hr tablet Take  1 tablet  Daily for Mood, Focus & Concentration                                         /         TAKE                      BY                      MOUTH Patient taking differently: Take 300 mg by mouth in the morning. 12/24/22   Vangie Genet, MD  Cyanocobalamin (VITAMIN B12 PO) Take 1 tablet by mouth daily.    [provider]  finasteride  (PROSCAR ) 5 MG tablet TAKE 1 TABLET BY MOUTH DAILY  FOR PROSTATE 03/31/23   Wilkinson, Dana E, FNP  gabapentin  (NEURONTIN ) 300 MG capsule Take  1 capsule  3 x /day for  Pain                                                                                              /  TAKE                                         BY                                                 MOUTH Patient taking differently: Take 300 mg by mouth at bedtime. 10/11/22   Vangie Genet, MD  glucose blood (FREESTYLE TEST STRIPS) test strip Check blood sugar 3 to 4 times daily for medication regulation. 12/04/15   Vangie Genet, MD  latanoprost  (XALATAN ) 0.005 % ophthalmic solution Place 1 drop into both eyes at bedtime.  04/11/16   [provider]  Magnesium  500 MG TABS Take 1 tablet (500 mg total) by mouth daily. 07/05/21   [place] Bureau, NP  modafinil  (PROVIGIL ) 200 MG tablet Take 1 tablet (200 mg total) by mouth daily. 06/23/23   Fargo, Amy E, NP  mycophenolate  (MYFORTIC ) 360 MG TBEC EC tablet TAKE 1 TABLET BY MOUTH TWICE DAILY 05/06/16   Vangie Genet, MD  NOVOLOG  FLEXPEN 100 UNIT/ML FlexPen Inject 2-3 Units into the skin See admin instructions. Inject 2-3 units into the skin three times a day before meals, per sliding scale    [provider]  pantoprazole  (PROTONIX ) 40 MG tablet Take 40 mg by mouth daily before breakfast.    [provider]  tacrolimus  (PROGRAF ) 0.5 MG capsule Take 0.5 mg by mouth every 12 (twelve) hours.    [provider]  tacrolimus  (PROGRAF ) 1 MG capsule Take 1 mg by mouth every 12 (twelve) hours.    [provider]  tamsulosin  (FLOMAX ) 0.4 MG CAPS capsule Take 1 capsule (0.4 mg total) by mouth daily. Patient taking differently: Take 0.4 mg by mouth at bedtime. 06/23/23   Fargo, Amy E, NP  traZODone  (DESYREL ) 50 MG tablet TAKE ONE TABLET BY MOUTH DAILY ONE HOUR BEFORE BEDTIME AS NEEDED FOR SLEEP Patient taking differently: Take 50 mg by mouth at  bedtime. 07/15/23   Arnetha Bhat, NP  valsartan  (DIOVAN ) 80 MG tablet TAKE 1 TABLET BY MOUTH DAILY 08/04/23   Nahser, Lela Purple, MD  warfarin (COUMADIN ) 5 MG tablet Takes 5mg  by mouth daily Monday- Saturday, reduce dose to 2.5 mg every sunday Patient taking differently: Take 5 mg by mouth in the morning. 06/08/23   Fargo, Amy E, NP    Allergies: Losartan, Tape, Morphine , Crestor [rosuvastatin], and Lorazepam    Review of Systems  Constitutional:  Negative for chills and fever.  HENT:  Negative for ear pain and sore throat.   Eyes:  Negative for pain and visual disturbance.  Respiratory:  Positive for shortness of breath. Negative for cough.   Cardiovascular:  Negative for chest pain and palpitations.  Gastrointestinal:  Negative for abdominal pain and vomiting.  Genitourinary:  Negative for dysuria and hematuria.  Musculoskeletal:  Negative for arthralgias and back pain.  Skin:  Negative for color change and rash.  Neurological:  Negative for seizures and syncope.  All other systems reviewed and are negative.   Updated Vital Signs BP 126/69 (BP Location: Right Arm)   Pulse 80 Comment: ambulatory  Temp 97.7 F (36.5 C) (Oral)   Resp 18   Ht 5' 7 (1.702 m)   Wt 75.8 kg  SpO2 96% Comment: ambulatory, wearing surgical mask  BMI 26.16 kg/m   Physical Exam Vitals and nursing note reviewed.  Constitutional:      General: He is not in acute distress.    Appearance: He is well-developed. He is not ill-appearing.  HENT:     Head: Normocephalic and atraumatic.   Eyes:     Conjunctiva/sclera: Conjunctivae normal.    Cardiovascular:     Rate and Rhythm: Normal rate and regular rhythm.     Heart sounds: No murmur heard. Pulmonary:     Effort: Pulmonary effort is normal. No tachypnea or respiratory distress.     Breath sounds: Normal breath sounds. No wheezing, rhonchi or rales.  Abdominal:     Palpations: Abdomen is soft.     Tenderness: There is no abdominal tenderness.    Musculoskeletal:        General: No swelling.     Cervical back: Neck supple.     Right lower leg: No edema.     Left lower leg: No edema.   Skin:    General: Skin is warm and dry.     Capillary Refill: Capillary refill takes less than 2 seconds.   Neurological:     General: No focal deficit present.     Mental Status: He is alert.   Psychiatric:        Mood and Affect: Mood normal.     (all labs ordered are listed, but only abnormal results are displayed) Labs Reviewed  BASIC METABOLIC PANEL WITH GFR - Abnormal; Notable for the following components:      Result Value   Glucose, Bld 173 (*)    BUN 34 (*)    All other components within normal limits  CBC - Abnormal; Notable for the following components:   MCV 100.7 (*)    Platelets 143 (*)    All other components within normal limits  BRAIN NATRIURETIC PEPTIDE - Abnormal; Notable for the following components:   B Natriuretic Peptide 947.1 (*)    All other components within normal limits  TROPONIN I (HIGH SENSITIVITY) - Abnormal; Notable for the following components:   Troponin I (High Sensitivity) 61 (*)    All other components within normal limits  TROPONIN I (HIGH SENSITIVITY) - Abnormal; Notable for the following components:   Troponin I (High Sensitivity) 61 (*)    All other components within normal limits    EKG: EKG Interpretation Date/Time:  Monday September 22 2023 12:24:44 EDT Ventricular Rate:  64 PR Interval:  374 QRS Duration:  141 QT Interval:  425 QTC Calculation: 439 R Axis:   251  Text Interpretation: Sinus rhythm Atrial premature complex Prolonged PR interval Right bundle branch block No significant change since last tracing Confirmed by Trish Furl [phone]) on 09/22/2023 12:31:17 PM  Radiology: DG Chest 2 View Result Date: 09/22/2023 CLINICAL DATA:  Shortness of breath EXAM: CHEST - 2 VIEW COMPARISON:  September 11, 2023 FINDINGS: Persistent prominence of the interstitial markings may correlate with  minimal residual congestive changes without consolidations or pleural effusions Heart and mediastinum normal. IMPRESSION: Subtle residual congestive changes Electronically Signed   By: Fredrich Jefferson M.D.   On: 09/22/2023 13:26     Procedures   Medications Ordered in the ED  furosemide  (LASIX ) injection 40 mg (40 mg Intravenous Given 09/22/23 1438)  Medical Decision Making Medical Decision Making Nursing notes are reviewed. Differential diagnosis for this patient would include but not limited to: Fluid overload, CHF exacerbation, ACS, pneumonia, other  Records reviewed: Recent hospital visit from 6/5-25 reviewed and patient was seen here and treated for CHF and started on Lasix , he had an echocardiogram performed at that time  Studies: Chest x-ray interpreted independently and shows evidence of vascular congestion that is unchanged when compared to prior  EKG interpretation: Interpreted by me in the absence of cardiology and shows no acute changes when compared to prior, no STEMI, sinus rhythm  Cardiac monitor interpretation: Sinus rhythm, no ectopy  Emergency Department Course:  Vital signs and pulse oximetry are reviewed, evaluated by myself and found to be within normal limits prior to final disposition. Findings of laboratory testing and medical imaging are discussed with patient and family that is available. Patient agrees with the medical care plan as follows: Patient's lab workup reviewed by me and BNP is improved from previous visit, his troponins are elevated but this seems to be stable he has a persistent troponin leak.  He is only on Lasix  20 mg daily.  Will increase this to 40 mg.  He was given 1 dose of Lasix  IV in the ER.  Patient was able to ambulate around the ER with pulse ox on and never got below 96% saturation on room air.  He is otherwise feeling well has a follow-up with his primary care doctor tomorrow and on Thursday with his  cardiologist.  Advised to go to those appointments otherwise return for new or worsening symptoms.  Feels control being discharged.  Problems Addressed: Congestive heart failure, unspecified HF chronicity, unspecified heart failure type [facility]): chronic illness or injury with exacerbation, progression, or side effects of treatment  Amount and/or Complexity of Data Reviewed External Data Reviewed: notes. Labs: ordered. Decision-making details documented in ED Course. Radiology: ordered and independent interpretation performed. Decision-making details documented in ED Course. ECG/medicine tests: ordered and independent interpretation performed. Decision-making details documented in ED Course.  Risk OTC drugs. Prescription drug management. Drug therapy requiring intensive monitoring for toxicity.     Final diagnoses:  Congestive heart failure, unspecified HF chronicity, unspecified heart failure type [facility])    ED Discharge Orders          Ordered    furosemide  (LASIX ) 40 MG tablet  Daily        09/22/23 1643               Kourtnie Sachs L, DO 09/22/23 1646

## 2023-09-24 ENCOUNTER — Other Ambulatory Visit: Payer: Self-pay | Admitting: Orthopedic Surgery

## 2023-09-24 DIAGNOSIS — G47419 Narcolepsy without cataplexy: Secondary | ICD-10-CM

## 2023-09-25 ENCOUNTER — Encounter: Payer: Self-pay | Admitting: Nurse Practitioner

## 2023-09-25 ENCOUNTER — Ambulatory Visit: Attending: Nurse Practitioner | Admitting: Nurse Practitioner

## 2023-09-25 VITALS — BP 136/72 | HR 84 | Ht 67.5 in | Wt 176.0 lb

## 2023-09-25 DIAGNOSIS — I480 Paroxysmal atrial fibrillation: Secondary | ICD-10-CM | POA: Diagnosis present

## 2023-09-25 DIAGNOSIS — I10 Essential (primary) hypertension: Secondary | ICD-10-CM

## 2023-09-25 DIAGNOSIS — E785 Hyperlipidemia, unspecified: Secondary | ICD-10-CM

## 2023-09-25 DIAGNOSIS — I779 Disorder of arteries and arterioles, unspecified: Secondary | ICD-10-CM | POA: Diagnosis present

## 2023-09-25 DIAGNOSIS — G4733 Obstructive sleep apnea (adult) (pediatric): Secondary | ICD-10-CM | POA: Diagnosis present

## 2023-09-25 DIAGNOSIS — I5020 Unspecified systolic (congestive) heart failure: Secondary | ICD-10-CM | POA: Diagnosis present

## 2023-09-25 DIAGNOSIS — I2510 Atherosclerotic heart disease of native coronary artery without angina pectoris: Secondary | ICD-10-CM | POA: Diagnosis present

## 2023-09-25 DIAGNOSIS — E1165 Type 2 diabetes mellitus with hyperglycemia: Secondary | ICD-10-CM | POA: Insufficient documentation

## 2023-09-25 MED ORDER — FUROSEMIDE 40 MG PO TABS: 60.0000 mg | ORAL_TABLET | Freq: Every day | ORAL | 1 refills | Status: DC

## 2023-09-25 NOTE — Patient Instructions (Addendum)
 Medication Instructions:  Increase the Lasix  to 60mg  daily.  *If you need a refill on your cardiac medications before your next appointment, please call your pharmacy*   Lab Work: Return for labs in 7-10 days ............. BMET If you have labs (blood work) drawn today and your tests are completely normal, you will receive your results only by: MyChart Message (if you have MyChart) OR A paper copy in the mail If you have any lab test that is abnormal or we need to change your treatment, we will call you to review the results.   Testing/Procedures: No procedures were ordered during today's visit.    Follow-Up: At [facility], you and your health needs are our priority.  As part of our continuing mission to provide you with exceptional heart care, we have created designated Provider Care Teams.  These Care Teams include your primary Cardiologist (physician) and Advanced Practice Providers (APPs -  Physician Assistants and Nurse Practitioners) who all work together to provide you with the care you need, when you need it.  We recommend signing up for the patient portal called MyChart.  Sign up information is provided on this After Visit Summary.  MyChart is used to connect with patients for Virtual Visits (Telemedicine).  Patients are able to view lab/test results, encounter notes, upcoming appointments, etc.  Non-urgent messages can be sent to your provider as well.   To learn more about what you can do with MyChart, go to [email or web address].    Your next appointment:   1 month(s)  Provider:   Marlana Silvan, NP          Other Instructions Thank you for choosing [facility]!

## 2023-09-25 NOTE — Progress Notes (Signed)
 Office Visit    Patient Name: Robert Arnold. 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   Past Medical History    Past Medical History:  Diagnosis Date   Adenomatous colon polyp    Allergy    BPH (benign prostatic hyperplasia)    CAD (coronary artery disease)    a. NSTEMI 09/2011 s/p PCI to LAD. b. CABG 01/2012 @ [facility].   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   Trifascicular block    Past Surgical History:  Procedure Laterality Date   ANTERIOR CERVICAL DECOMP/DISCECTOMY FUSION N/A 05/04/2018   Procedure: Cervical Five-Six Anterior cervical decompression/discectomy/fusion;  Surgeon: Yvonna Herder, MD;  Location: [facility] OR;  Service: Neurosurgery;  Laterality: N/A;  Cervical Five-Six Anterior cervical decompression/discectomy/fusion   APPENDECTOMY     AV FISTULA PLACEMENT  2011   Left forearm    COLONOSCOPY     CORONARY ANGIOPLASTY WITH STENT PLACEMENT     CORONARY ARTERY BYPASS GRAFT  2013   KIDNEY TRANSPLANT  09/25/2011   LAPAROSCOPIC APPENDECTOMY N/A 04/06/2021   Procedure: APPENDECTOMY LAPAROSCOPIC;  Surgeon: Enid Harry, MD;  Location: [facility] OR;  Service: General;  Laterality: N/A;   PERITONEAL CATHETER INSERTION  2011    Allergies  Allergies  Allergen Reactions   Losartan Anaphylaxis   Tape Other (See Comments)    SKIN IS VERY DELICATE AND WILL TEAR AND BRUISE EASILY!!!   Morphine  Other (See Comments)    This is INEFFECTIVE   Crestor [Rosuvastatin] Other (See Comments)    Elevated LFT's   Lorazepam Other (See Comments)    Pt is unsure of reaction      Labs/Other Studies Reviewed    The following studies were reviewed today:  Cardiac Studies & Procedures   ______________________________________________________________________________________________   STRESS TESTS  MYOCARDIAL PERFUSION IMAGING 06/06/2021  Narrative   Findings are consistent with prior myocardial infarction. The study is high risk.   No ST deviation was noted.   LV perfusion is abnormal. Defect 1: There is a medium defect with moderate reduction in uptake present in the apical to basal anterolateral location(s) that is fixed.   Left ventricular function is abnormal. Global function is severely reduced. Nuclear stress EF: 38 %. The left ventricular ejection fraction is moderately decreased (30-44%). End diastolic cavity size is severely enlarged. End systolic cavity size is severely enlarged.  Prior study available for comparison from 01/08/2012.  High risk scan due to LvE and EF 38% Diffuse hypokinesis worse in the anterolateral wall Fixed infarct in anterolateral wall from apex to base no ischemia ? ECG shows sinus with very long first degree block   ECHOCARDIOGRAM  ECHOCARDIOGRAM COMPLETE 09/12/2023  Narrative ECHOCARDIOGRAM REPORT    Patient Name:   Robert Arnold. Date of  Exam: 09/12/2023 Medical Rec #:  [id number]           Height:       67.5 in Accession #:    [id number]          Weight:       167.5 lb Date of Birth:  [birth year]          BSA:          1.886 m Patient Age:    77 years            BP:           134/69 mmHg Patient Gender: M                   HR:           62 bpm. Exam Location:  Inpatient  Procedure: 2D Echo, Cardiac Doppler and Color Doppler (Both Spectral and Color Flow Doppler were utilized during procedure).  Indications:    CHF I50.9  History:        Patient has prior history of Echocardiogram examinations, most recent 04/10/2021.  Sonographer:    Hersey Lorenzo RDCS Referring Phys: [id number] RAMESH KC  IMPRESSIONS   1. Global hypokinesis worse in the posterior lateral wall and septum. Left ventricular ejection fraction, by estimation, is 35 to 40%. The left ventricle has moderately decreased function. The left ventricle demonstrates global hypokinesis. The left ventricular internal cavity size was moderately dilated. There is moderate left ventricular hypertrophy. Left ventricular diastolic parameters were normal. 2. Right ventricular systolic function is normal. The right ventricular size is normal. 3. Left atrial size was moderately dilated. 4. The mitral valve is abnormal. Trivial mitral valve regurgitation. No evidence of mitral stenosis. 5. Calcified mean gradient 6 peak 9 mmHg but AVA 2.5 cm2. The aortic valve is tricuspid. There is moderate calcification of the aortic valve. There is moderate thickening of the aortic valve. Aortic valve regurgitation is trivial. Aortic valve sclerosis/calcification is present, without any evidence of aortic stenosis. 6. The inferior vena cava is normal in size with greater than 50% respiratory variability, suggesting right atrial pressure of 3 mmHg.  FINDINGS Left Ventricle: Global hypokinesis worse in the posterior lateral wall and septum. Left ventricular ejection fraction, by estimation, is 35  to 40%. The left ventricle has moderately decreased function. The left ventricle demonstrates global hypokinesis. Strain was performed and the global longitudinal strain is indeterminate. The left ventricular internal cavity size was moderately dilated. There is moderate left ventricular hypertrophy. Left ventricular diastolic parameters were normal.  Right Ventricle: The right ventricular size is normal. No increase in right ventricular wall thickness. Right ventricular systolic function is normal.  Left Atrium: Left atrial size was moderately dilated.  Right Atrium: Right atrial size was normal in size.  Pericardium: There is no evidence of pericardial effusion.  Mitral Valve: The mitral valve is abnormal. There is mild thickening of the mitral valve leaflet(s). There is mild calcification of the mitral valve leaflet(s). Mild mitral annular calcification. Trivial mitral valve regurgitation. No evidence of mitral valve stenosis.  Tricuspid Valve:  The tricuspid valve is normal in structure. Tricuspid valve regurgitation is trivial. No evidence of tricuspid stenosis.  Aortic Valve: Calcified mean gradient 6 peak 9 mmHg but AVA 2.5 cm2. The aortic valve is tricuspid. There is moderate calcification of the aortic valve. There is moderate thickening of the aortic valve. Aortic valve regurgitation is trivial. Aortic valve sclerosis/calcification is present, without any evidence of aortic stenosis. Aortic valve mean gradient measures 9.0 mmHg. Aortic valve peak gradient measures 15.9 mmHg. Aortic valve area, by VTI measures 2.62 cm.  Pulmonic Valve: The pulmonic valve was normal in structure. Pulmonic valve regurgitation is not visualized. No evidence of pulmonic stenosis.  Aorta: The aortic root is normal in size and structure.  Venous: The inferior vena cava is normal in size with greater than 50% respiratory variability, suggesting right atrial pressure of 3 mmHg.  IAS/Shunts: No atrial level  shunt detected by color flow Doppler.  Additional Comments: 3D was performed not requiring image post processing on an independent workstation and was indeterminate.   LEFT VENTRICLE PLAX 2D LVIDd:         5.50 cm   Diastology LVIDs:         4.00 cm   LV e' lateral:   8.67 cm/s LV PW:         1.30 cm   LV E/e' lateral: 11.3 LV IVS:        1.30 cm LVOT diam:     2.20 cm LV SV:         101 LV SV Index:   54 LVOT Area:     3.80 cm   RIGHT VENTRICLE            IVC RV S prime:     6.41 cm/s  IVC diam: 2.10 cm TAPSE (M-mode): 1.2 cm  LEFT ATRIUM             Index LA diam:        4.40 cm 2.33 cm/m LA Vol (A2C):   62.3 ml 33.03 ml/m LA Vol (A4C):   71.2 ml 37.75 ml/m LA Biplane Vol: 72.8 ml 38.60 ml/m AORTIC VALVE AV Area (Vmax):    2.92 cm AV Area (Vmean):   3.02 cm AV Area (VTI):     2.62 cm AV Vmax:           199.50 cm/s AV Vmean:          141.000 cm/s AV VTI:            0.387 m AV Peak Grad:      15.9 mmHg AV Mean Grad:      9.0 mmHg LVOT Vmax:         153.00 cm/s LVOT Vmean:        112.000 cm/s LVOT VTI:          0.267 m LVOT/AV VTI ratio: 0.69  AORTA Ao Root diam: 3.40 cm Ao Asc diam:  3.50 cm  MITRAL VALVE MV Area (PHT): 4.06 cm    SHUNTS MV Decel Time: 187 msec    Systemic VTI:  0.27 m MV E velocity: 98.10 cm/s  Systemic Diam: 2.20 cm MV A velocity: 91.30 cm/s MV E/A ratio:  1.07  Janelle Mediate MD Electronically signed by Janelle Mediate MD Signature Date/Time: 09/12/2023/1:58:20 PM    Final          ______________________________________________________________________________________________     Recent Labs: 04/23/2023: Magnesium  2.2; TSH 2.05 09/11/2023: ALT 51 09/22/2023: B Natriuretic Peptide 947.1; BUN 34;  Creatinine, Ser 1.12; Hemoglobin 13.9; Platelets 143; Potassium 4.9; Sodium 138  Recent Lipid Panel    Component Value Date/Time   CHOL 130 04/23/2023 1034   CHOL 158 03/15/2013 0744   TRIG 48 04/23/2023 1034   HDL 64 04/23/2023 1034    HDL 61 03/15/2013 0744   CHOLHDL 2.0 04/23/2023 1034   VLDL 17 09/09/2016 1042   LDLCALC 52 04/23/2023 1034    History of Present Illness    78 year old male with the above past medical history including CAD s/p DES-LAD in 09/2011, s/p CABG x4 (performed at [facility], unclear anatomy per chart review), ICM, chronic HFrEF, paroxysmal atrial fibrillation, trifascicular block, carotid artery stenosis, hypertension, hyperlipidemia, 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   He has a history of CAD with prior stenting, CABG x 4 in 2013.  Developed postop atrial fibrillation.  He was started on Coumadin .  He has a history of trifascicular heart block.  Echocardiogram in 2023 showed EF 45 to 50%, moderate aortic insufficiency, mild aortic stenosis, mild dilation of ascending aorta measuring 41 mm.  Previously unable to afford Entresto .  Myoview  in 06/2021 showed evidence of prior infarction, no reversible ischemia.  He has a history of mild bilateral carotid artery stenosis.  Most recent carotid duplex in 08/2023 showed 1 to 39% B ICA stenosis.  He was last seen in the office on 01/17/2023 and was stable from a cardiac standpoint.  He denied symptoms concerning for angina.  He was exercising regularly.  He was hospitalized from 09/11/2023 to 09/12/2023 in the setting of acute on chronic systolic heart failure.  BNP was elevated.  Troponin was elevated with flat trend.  Echocardiogram showed EF 35 to 40%, moderately decreased LV function, moderate LVH, normal RV systolic function, no significant valvular abnormalities.  He was diuresed with IV Lasix .  He was advised to follow-up with cardiology as an outpatient.  He returned to the ED on 09/22/2023 with concern for increased dyspnea, orthopnea.  Lasix  was increased to 40 mg daily.  He presents today for follow-up accompanied by his wife. Since his last visit and since his recent hospitalization he has been  stable from a cardiac standpoint.  His weight is up by our scale, he states that this weight is inaccurate.  He reports weighing himself daily on his home scale, per pt, his weight has been stable at 169 lbs per home scale. He reports stable mild dyspnea, overall improved, intermittent lightheadedness.  He denies any chest pain, palpitations, dizziness, presyncope, syncope, edema, PND, orthopnea, weight gain.  He did not see much improvement in his symptoms with Lasix  40 mg daily.  He has been taking additional Lasix  20 mg daily, and with this change, he has noticed significant improvement.  He reports that his PCP mentioned that he may need a pacemaker in the future. He would like to know Dr. Letta Raw opinion on this.  Home Medications    Current Outpatient Medications  Medication Sig Dispense Refill   allopurinol  (ZYLOPRIM ) 300 MG tablet TAKE 1 TABLET BY MOUTH DAILY TO PREVENT GOUT 90 tablet 3   atorvastatin  (LIPITOR) 40 MG tablet Take 1 tab daily for cholesterol. (Patient taking differently: Take 40 mg by mouth in the morning.) 90 tablet 0   brimonidine  (ALPHAGAN ) 0.2 % ophthalmic solution Place 1 drop into both eyes 2 (two) times daily.     buPROPion  (WELLBUTRIN  XL) 300 MG 24 hr tablet Take  1  tablet  Daily for Mood, Focus & Concentration                                         /         TAKE                      BY                      MOUTH (Patient taking differently: Take 300 mg by mouth in the morning.) 90 tablet 3   Cyanocobalamin (VITAMIN B12 PO) Take 1 tablet by mouth daily.     finasteride  (PROSCAR ) 5 MG tablet TAKE 1 TABLET BY MOUTH DAILY FOR PROSTATE 90 tablet 3   gabapentin  (NEURONTIN ) 300 MG capsule Take  1 capsule  3 x /day for  Pain                                                                                              /                                                                   TAKE                                         BY                                                  MOUTH (Patient taking differently: Take 300 mg by mouth at bedtime.) 270 capsule 3   glucose blood (FREESTYLE TEST STRIPS) test strip Check blood sugar 3 to 4 times daily for medication regulation. 450 each PRN   latanoprost  (XALATAN ) 0.005 % ophthalmic solution Place 1 drop into both eyes at bedtime.   4   Magnesium  500 MG TABS Take 1 tablet (500 mg total) by mouth daily. 30 tablet    modafinil  (PROVIGIL ) 200 MG tablet Take 1 tablet (200 mg total) by mouth daily. 90 tablet 1   mycophenolate  (MYFORTIC ) 360 MG TBEC EC tablet TAKE 1 TABLET BY MOUTH TWICE DAILY 180 tablet 1   NOVOLOG  FLEXPEN 100 UNIT/ML FlexPen Inject 2-3 Units into the skin See admin instructions. Inject 2-3 units into the skin three times a day before meals, per sliding scale     tacrolimus  (PROGRAF ) 0.5 MG capsule Take 0.5 mg by mouth every 12 (twelve) hours.     tacrolimus  (PROGRAF ) 1 MG capsule Take 1 mg by mouth every 12 (twelve) hours.  tamsulosin  (FLOMAX ) 0.4 MG CAPS capsule Take 1 capsule (0.4 mg total) by mouth daily. (Patient taking differently: Take 0.4 mg by mouth at bedtime.) 90 capsule 2   traZODone  (DESYREL ) 50 MG tablet TAKE ONE TABLET BY MOUTH DAILY ONE HOUR BEFORE BEDTIME AS NEEDED FOR SLEEP (Patient taking differently: Take 50 mg by mouth at bedtime.) 90 tablet 3   valsartan  (DIOVAN ) 80 MG tablet TAKE 1 TABLET BY MOUTH DAILY 90 tablet 1   warfarin (COUMADIN ) 5 MG tablet Takes 5mg  by mouth daily Monday- Saturday, reduce dose to 2.5 mg every sunday (Patient taking differently: Take 5 mg by mouth in the morning.)     furosemide  (LASIX ) 40 MG tablet Take 1.5 tablets (60 mg total) by mouth daily. 30 tablet 1   pantoprazole  (PROTONIX ) 40 MG tablet Take 40 mg by mouth daily before breakfast.     No current facility-administered medications for this visit.     Review of Systems    He denies chest pain, palpitations, pnd, orthopnea, n, v, dizziness, syncope, edema, weight gain, or early satiety. All other systems  reviewed and are otherwise negative except as noted above.     Physical Exam    VS:  BP 136/72   Pulse 84   Ht 5' 7.5 (1.715 m)   Wt 176 lb (79.8 kg)   SpO2 94%   BMI 27.16 kg/m  GEN: Well nourished, well developed, in no acute distress. HEENT: normal. Neck: Supple, no JVD, carotid bruits, or masses. Cardiac: RRR, no murmurs, rubs, or gallops. No clubbing, cyanosis, edema.  Radials/DP/PT 2+ and equal bilaterally.  Respiratory:  Respirations regular and unlabored, clear to auscultation bilaterally. GI: Soft, nontender, nondistended, BS + x 4. MS: no deformity or atrophy. Skin: warm and dry, no rash. Neuro:  Strength and sensation are intact. Psych: Normal affect.  Accessory Clinical Findings    ECG personally reviewed by me today - EKG Interpretation Date/Time:  Thursday September 25 2023 08:48:04 EDT Ventricular Rate:  94 PR Interval:    QRS Duration:  144 QT Interval:  392 QTC Calculation: 490 R Axis:   -79  Text Interpretation: Normal sinus rhythm Left axis deviation Right bundle branch block Septal infarct , age undetermined When compared with ECG of 22-Sep-2023 12:24, PREVIOUS ECG IS PRESENT Confirmed by Marlana Silvan ([id number]) on 09/25/2023 8:58:09 AM  - no acute changes.   Lab Results  Component Value Date   WBC 8.2 09/22/2023   HGB 13.9 09/22/2023   HCT 43.7 09/22/2023   MCV 100.7 (H) 09/22/2023   PLT 143 (L) 09/22/2023   Lab Results  Component Value Date   CREATININE 1.12 09/22/2023   BUN 34 (H) 09/22/2023   NA 138 09/22/2023   K 4.9 09/22/2023   CL 103 09/22/2023   CO2 28 09/22/2023   Lab Results  Component Value Date   ALT 51 (H) 09/11/2023   AST 52 (H) 09/11/2023   ALKPHOS 138 (H) 09/11/2023   BILITOT 1.5 (H) 09/11/2023   Lab Results  Component Value Date   CHOL 130 04/23/2023   HDL 64 04/23/2023   LDLCALC 52 04/23/2023   TRIG 48 04/23/2023   CHOLHDL 2.0 04/23/2023    Lab Results  Component Value Date   HGBA1C 5.7 (H) 04/23/2023     Assessment & Plan    1. Chronic HFrEF: Prior EF 45 to 50%. Recent hospitalization in the setting of acute on chronic systolic heart failure.  BNP was elevated.  Troponin was elevated with flat  trend.  Echocardiogram showed EF 35 to 40%, moderately decreased LV function, moderate LVH, normal RV systolic function, no significant valvular abnormalities.  Lasix  was increased to 40 mg daily.  However, he has required an additional Lasix  20 mg daily.  With this change, he has noticed an improvement in his shortness of breath, orthopnea.  Euvolemic and well compensated on exam.  Will increase Lasix  to 60 mg daily, to reflect accurate dose.  Will repeat BMET in 7 to 10 days.  Previously unable to afford SGLT2 inhibitor. Spironolactone  was discontinued due to drug-drug interaction with Prograf .  He is not on beta-blocker therapy in the setting of heart block.  His PCP told him that may he may require a pacemaker in the future.  I am not sure that he meets the criteria for CRT therapy at this time, however, I will reach out to Dr. Alroy Aspen to see if he would like for patient to establish with EP.  Reviewed daily weights, ED precautions.  Continue valsartan , Lasix .  2. CAD: S/p DES-LAD in 09/2011, s/p CABG x4 (performed at [facility], unclear anatomy per chart review).  Recent elevation in troponin with flat trend, most recent echocardiogram with decreased EF as above.  He has mild dyspnea on exertion, overall improved with diuresis.  Denies chest pain or symptoms similar to prior anginal equivalent.  Should his symptoms persist despite increased diuresis as above, consider need for ischemic evaluation, he wishes to defer for now.  No ASA in the setting of chronic warfarin.  Continue valsartan , Lipitor.  3. Paroxysmal atrial fibrillation/Trifascicular block: EKG difficult to interpret today, however, appears to show sinus rhythm with prolonged PR interval, RBBB.  Denies any palpitations, dizziness, presyncope or syncope.   Continue warfarin, this has been monitored by his PCP.  4. Carotid artery stenosis: Most recent carotid duplex in 08/2023 showed 1 to 39% B ICA stenosis.  Asymptomatic.  Consider repeat study as: Indicated.  Continue Lipitor.  5. Hypertension: BP well controlled. Continue current antihypertensive regimen.   6. Hyperlipidemia: LDL was 52 in 04/2023.  Continue Lipitor.  7. ESRD: Secondary to IgA nephropathy.  S/p renal transplant (his grandson was his kidney donor). Followed by nephrology.   8. Type 2 diabetes: A1c was 5.7 in 04/2023. Followed by endocrinology.   9. Cirrhosis: Followed by GI.   9. OSA: Not on CPAP.   10. Disposition: Follow-up in 1 month. I will reach out to Dr. Alroy Aspen per pt's request for recommendations for cardiologist going forward in the setting of his retirement.      Jude Norton, NP 09/25/2023, 12:26 PM

## 2023-10-02 ENCOUNTER — Ambulatory Visit: Payer: Medicare Other | Admitting: Nurse Practitioner

## 2023-10-03 LAB — BASIC METABOLIC PANEL WITH GFR
BUN/Creatinine Ratio: 33 — ABNORMAL HIGH (ref 10–24)
BUN: 43 mg/dL — ABNORMAL HIGH (ref 8–27)
CO2: 24 mmol/L (ref 20–29)
Calcium: 9.7 mg/dL (ref 8.6–10.2)
Chloride: 102 mmol/L (ref 96–106)
Creatinine, Ser: 1.3 mg/dL — ABNORMAL HIGH (ref 0.76–1.27)
Glucose: 136 mg/dL — ABNORMAL HIGH (ref 70–99)
Potassium: 4.6 mmol/L (ref 3.5–5.2)
Sodium: 142 mmol/L (ref 134–144)
eGFR: 57 mL/min/{1.73_m2} — ABNORMAL LOW (ref >59)

## 2023-10-06 ENCOUNTER — Ambulatory Visit: Payer: Self-pay | Admitting: Nurse Practitioner

## 2023-10-20 NOTE — Progress Notes (Unsigned)
 HPI :  78 year old male with a past medical history as listed below including CAD, ESRD and multiple others, who presents to clinic today for follow-up of his newly diagnosed cirrhosis.    01/31/2021 colonoscopy for abnormal CT of the GI tract with 1 diminutive polyp in the proximal transverse colon, diverticulosis in the sigmoid colon and internal hemorrhoids as well as superficial mild rectal erythema with suspected bowel prep artifact.  At that time patient was [facility] for appendectomy.    02/25/2023 CMP with a BUN of 32, total bili 1.5.  CBC with a hemoglobin of 13 (around patient's baseline).    02/26/2023 abdominal ultrasound with cholelithiasis but no evidence of acute cholecystitis or bile duct obstruction, nodular liver contour suspicious for cirrhosis, right lower quadrant renal transplant with unremarkable ultrasound appearance.    03/04/2023 HIDA scan for recurrent gallbladder dyskinesia with patent cystic duct and normal ejection fraction.    03/31/2023 patient seen for abdominal pain.  At that time completely better on his abdominal pain and weight loss and had been gaining weight over the past 2 to 3 weeks.  Ultrasound did show question of cirrhosis which was new for him.  Ordered a CT for further eval.    04/08/2023 CT abdomen pelvis showed moderate cirrhosis without hepatocellular carcinoma portal venous hypertension, right iliac fossa renal transplant without complication and cholelithiasis with bilateral nephrolithiasis.    04/23/2023 CBC with normal platelets.  CMP with normal LFTs.  INR 1.8 but patient is on Coumadin .       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 no evidence of portal hypertension.  LFTs normal. INR out of range  on coumadin .  Ordered liver labs to rule out autoimmune disorders with cemented problem to be completed at lab work (we do not have these test available).     Dr. Leigh  reviewed case and recommended appt with him to discuss possible EGD. Though high risk given EF 38% and likely low yield with normal platelets and CT without signs of portal hypertension/varices.   MELD 3.0: 23 April 2023   Patient presents today with his wife and has no issues.  No complaints.  Doing well.  Denies yellowing of skin or eyes, fluid buildup in legs or belly, confusion.  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  Normal LFTs.  INR elevated in the setting of Coumadin .  He appears to be in great shape for his age, though with his ejection fraction being 38% on nuclear stress test 2013 with recent echocardiogram January 2023 showing EF 45 to 50%. no obvious evidence of varices on CT. risk versus benefits of EGD to rule out varices must be considered.  Discussed this in detail with him and his wife.  Also spent extensive time discussing cirrhosis and providing patient education handouts.  They would like Dr. Hassan recommendation - Proceed with serologic workup for autoimmune and genetic etiology - CBC, CMP, PT/INR to recheck MELD - Due for [facility] screening 09/2023 - Will discuss with Dr. Leigh plans for EGD   ESRD s/p renal transplant 2013   CAD s/p PCI 2013 EF 38% on nuclear stress test 2013, EF 45 to 50% January 2023 on echocardiogram   PAF On Coumadin      Agree with  assessment and plan as outlined.   He has cirrhosis with a platelet count less than 150.  It has fluctuated to low 100s in recent years.  Given he is on anticoagulation with Coumadin , if you think he can tolerate anesthesia, I think reasonable to do an EGD to screen for varices.   I think it would be good to have Norleen Schillings review his chart to see if cleared for anesthesia in the [facility], his EF has been above 35% on nuclear  stress and on echo but want to get his opinion before we put him on the schedule.  He is status post renal transplant as well.   John can you take a look at this patient's chart and help us  determine if he is appropriate for the [facility] or would you prefer him to be done at the hospital?  Thanks   Otherwise, agree with serologic workup otherwise for this.  Great, thank you John.   Bayley / Pod A RN can you let the patient know we can do an EGD at the [facility] to screen for varices, if he is willing. I think okay to do a diagnostic exam ON coumadin , to screen for varices. Thanks   NEEDS EGD    Past Medical History:  Diagnosis Date   Adenomatous colon polyp    Allergy    BPH (benign prostatic hyperplasia)    CAD (coronary artery disease)    a. NSTEMI 09/2011 s/p PCI to LAD. b. CABG 01/2012 @ [facility].   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   Trifascicular block      Past Surgical History:  Procedure Laterality Date   ANTERIOR CERVICAL DECOMP/DISCECTOMY FUSION N/A 05/04/2018   Procedure: Cervical Five-Six Anterior cervical decompression/discectomy/fusion;  Surgeon: Alix Charleston, MD;  Location: [facility] OR;  Service: Neurosurgery;  Laterality: N/A;  Cervical Five-Six Anterior cervical decompression/discectomy/fusion   APPENDECTOMY     AV FISTULA PLACEMENT  2011   Left forearm   COLONOSCOPY     CORONARY ANGIOPLASTY WITH  STENT PLACEMENT     CORONARY ARTERY BYPASS GRAFT  2013   KIDNEY TRANSPLANT  09/25/2011   LAPAROSCOPIC APPENDECTOMY N/A 04/06/2021   Procedure: APPENDECTOMY LAPAROSCOPIC;  Surgeon: Ebbie Cough, MD;  Location: [facility] OR;  Service: General;  Laterality: N/A;   PERITONEAL CATHETER INSERTION  2011   Family History  Problem Relation Age of Onset   Diabetes Mother    COPD Mother    Heart attack Father        Unsure    COPD Father    Kidney disease Sister    Alcoholism Brother    Alzheimer's disease Brother    Alcohol abuse Brother    COPD Brother    Alcohol abuse Maternal Grandfather    Colon cancer Neg Hx    Stomach cancer Neg Hx  Rectal cancer Neg Hx    Esophageal cancer Neg Hx    Liver cancer Neg Hx    Pancreatic cancer Neg Hx    Social History   Tobacco Use   Smoking status: Former    Current packs/day: 0.00    Average packs/day: 2.0 packs/day for 24.6 years (49.3 ttl pk-yrs)    Types: Cigarettes    Start date: 75    Quit date: 11/29/1990    Years since quitting: 32.9   Smokeless tobacco: Never  Vaping Use   Vaping status: Never Used  Substance Use Topics   Alcohol use: Yes    Comment: rarely   Drug use: No   Current Outpatient Medications  Medication Sig Dispense Refill   allopurinol  (ZYLOPRIM ) 300 MG tablet TAKE 1 TABLET BY MOUTH DAILY TO PREVENT GOUT 90 tablet 3   atorvastatin  (LIPITOR) 40 MG tablet Take 1 tab daily for cholesterol. (Patient taking differently: Take 40 mg by mouth in the morning.) 90 tablet 0   brimonidine  (ALPHAGAN ) 0.2 % ophthalmic solution Place 1 drop into both eyes 2 (two) times daily.     buPROPion  (WELLBUTRIN  XL) 300 MG 24 hr tablet Take  1 tablet  Daily for Mood, Focus & Concentration                                         /         TAKE                      BY                      MOUTH (Patient taking differently: Take 300 mg by mouth in the morning.) 90 tablet 3   Cyanocobalamin (VITAMIN B12 PO) Take 1 tablet by mouth daily.      finasteride  (PROSCAR ) 5 MG tablet TAKE 1 TABLET BY MOUTH DAILY FOR PROSTATE 90 tablet 3   furosemide  (LASIX ) 40 MG tablet Take 1.5 tablets (60 mg total) by mouth daily. 30 tablet 1   gabapentin  (NEURONTIN ) 300 MG capsule Take  1 capsule  3 x /day for  Pain                                                                                              /                                                                   TAKE                                         BY  MOUTH (Patient taking differently: Take 300 mg by mouth at bedtime.) 270 capsule 3   glucose blood (FREESTYLE TEST STRIPS) test strip Check blood sugar 3 to 4 times daily for medication regulation. 450 each PRN   latanoprost  (XALATAN ) 0.005 % ophthalmic solution Place 1 drop into both eyes at bedtime.   4   Magnesium  500 MG TABS Take 1 tablet (500 mg total) by mouth daily. 30 tablet    modafinil  (PROVIGIL ) 200 MG tablet Take 1 tablet (200 mg total) by mouth daily. 90 tablet 1   mycophenolate  (MYFORTIC ) 360 MG TBEC EC tablet TAKE 1 TABLET BY MOUTH TWICE DAILY 180 tablet 1   NOVOLOG  FLEXPEN 100 UNIT/ML FlexPen Inject 2-3 Units into the skin See admin instructions. Inject 2-3 units into the skin three times a day before meals, per sliding scale     pantoprazole  (PROTONIX ) 40 MG tablet Take 40 mg by mouth daily before breakfast.     tacrolimus  (PROGRAF ) 0.5 MG capsule Take 0.5 mg by mouth every 12 (twelve) hours.     tacrolimus  (PROGRAF ) 1 MG capsule Take 1 mg by mouth every 12 (twelve) hours.     tamsulosin  (FLOMAX ) 0.4 MG CAPS capsule Take 1 capsule (0.4 mg total) by mouth daily. (Patient taking differently: Take 0.4 mg by mouth at bedtime.) 90 capsule 2   traZODone  (DESYREL ) 50 MG tablet TAKE ONE TABLET BY MOUTH DAILY ONE HOUR BEFORE BEDTIME AS NEEDED FOR SLEEP (Patient taking differently: Take 50 mg by mouth at bedtime.) 90 tablet 3   valsartan  (DIOVAN ) 80 MG tablet TAKE 1 TABLET BY MOUTH DAILY 90  tablet 1   warfarin (COUMADIN ) 5 MG tablet Takes 5mg  by mouth daily Monday- Saturday, reduce dose to 2.5 mg every sunday (Patient taking differently: Take 5 mg by mouth in the morning.)     No current facility-administered medications for this visit.   Allergies  Allergen Reactions   Losartan Anaphylaxis   Tape Other (See Comments)    SKIN IS VERY DELICATE AND WILL TEAR AND BRUISE EASILY!!!   Morphine  Other (See Comments)    This is INEFFECTIVE   Crestor [Rosuvastatin] Other (See Comments)    Elevated LFT's   Lorazepam Other (See Comments)    Pt is unsure of reaction      Review of Systems: All systems reviewed and negative except where noted in HPI.    DG Chest 2 View Result Date: 09/22/2023 CLINICAL DATA:  Shortness of breath EXAM: CHEST - 2 VIEW COMPARISON:  September 11, 2023 FINDINGS: Persistent prominence of the interstitial markings may correlate with minimal residual congestive changes without consolidations or pleural effusions Heart and mediastinum normal. IMPRESSION: Subtle residual congestive changes Electronically Signed   By: Franky Chard M.D.   On: 09/22/2023 13:26    Physical Exam: There were no vitals taken for this visit. Constitutional: Pleasant,well-developed, ***male in no acute distress. HEENT: Normocephalic and atraumatic. Conjunctivae are normal. No scleral icterus. Neck supple.  Cardiovascular: Normal rate, regular rhythm.  Pulmonary/chest: Effort normal and breath sounds normal. No wheezing, rales or rhonchi. Abdominal: Soft, nondistended, nontender. Bowel sounds active throughout. There are no masses palpable. No hepatomegaly. Extremities: no edema Lymphadenopathy: No cervical adenopathy noted. Neurological: Alert and oriented to person place and time. Skin: Skin is warm and dry. No rashes noted. Psychiatric: Normal mood and affect. Behavior is normal.   ASSESSMENT: 78 y.o. male here for assessment of the following  No diagnosis  found.  PLAN:   Gil Greig BRAVO, NP

## 2023-10-21 ENCOUNTER — Other Ambulatory Visit (INDEPENDENT_AMBULATORY_CARE_PROVIDER_SITE_OTHER)

## 2023-10-21 ENCOUNTER — Ambulatory Visit (INDEPENDENT_AMBULATORY_CARE_PROVIDER_SITE_OTHER): Admitting: Gastroenterology

## 2023-10-21 ENCOUNTER — Encounter: Payer: Self-pay | Admitting: Gastroenterology

## 2023-10-21 VITALS — BP 126/68 | HR 55 | Ht 68.0 in | Wt 173.4 lb

## 2023-10-21 DIAGNOSIS — K7460 Unspecified cirrhosis of liver: Secondary | ICD-10-CM

## 2023-10-21 DIAGNOSIS — Z7901 Long term (current) use of anticoagulants: Secondary | ICD-10-CM

## 2023-10-21 NOTE — Patient Instructions (Addendum)
 Please go to the lab in the basement of our building to have lab work done as you leave today. Hit B for basement when you get on the elevator.  When the doors open the lab is on your left.  We will call you with the results. Thank you.  You have been scheduled for an endoscopy at [facility] on 8-14. Please follow written instructions given to you at your visit today.  If you use inhalers (even only as needed), please bring them with you on the day of your procedure.  If you take any of the following medications, they will need to be adjusted prior to your procedure:   DO NOT TAKE 7 DAYS PRIOR TO TEST- Trulicity (dulaglutide) Ozempic, Wegovy (semaglutide) Mounjaro (tirzepatide) Bydureon Bcise (exanatide extended release)  DO NOT TAKE 1 DAY PRIOR TO YOUR TEST Rybelsus (semaglutide) Adlyxin (lixisenatide) Victoza (liraglutide) Byetta (exanatide) ___________________________________________________________________________    Robert Arnold may continue taking your Coumadin .  You will be contacted by [facility] Scheduling in the next 2 days to arrange a RUQ ultrasound.  The number on your caller ID will be [phone], please answer when they call.  If you have not heard from them in 2 days please call [phone] to schedule.     You have been scheduled for an abdominal ultrasound at [facility] (1st floor of hospital) on __________________ at ___________. Please arrive 30 minutes prior to your appointment for registration. Make certain not to have anything to eat or drink 6 hours prior to your appointment. Should you need to reschedule your appointment, please contact radiology at [phone]. This test typically takes about 30 minutes to perform.   Please follow up in 6 months (January 2026).  Thank you for entrusting me with your care and for choosing [facility], Dr. Elspeth Naval    If your blood pressure at your visit was 140/90 or greater, please contact  your primary care physician to follow up on this. ______________________________________________________  If you are age 39 or older, your body mass index should be between 23-30. Your Body mass index is 26.36 kg/m. If this is out of the aforementioned range listed, please consider follow up with your Primary Care Provider.  If you are age 1 or younger, your body mass index should be between 19-25. Your Body mass index is 26.36 kg/m. If this is out of the aformentioned range listed, please consider follow up with your Primary Care Provider.  ________________________________________________________  The [facility] providers would like to encourage you to use MYCHART to communicate with providers for non-urgent requests or questions.  Due to long hold times on the telephone, sending your provider a message by [facility] may be a faster and more efficient way to get a response.  Please allow 48 business hours for a response.  Please remember that this is for non-urgent requests.  _______________________________________________________  Due to recent changes in healthcare laws, you may see the results of your imaging and laboratory studies on MyChart before your provider has had a chance to review them.  We understand that in some cases there may be results that are confusing or concerning to you. Not all laboratory results come back in the same time frame and the provider may be waiting for multiple results in order to interpret others.  Please give us  48 hours in order for your provider to thoroughly review all the results before contacting the office for clarification of your results.

## 2023-10-25 ENCOUNTER — Ambulatory Visit: Payer: Self-pay | Admitting: Gastroenterology

## 2023-10-25 LAB — HEPATITIS A ANTIBODY, TOTAL: Hepatitis A AB,Total: NONREACTIVE

## 2023-10-25 LAB — HEPATITIS B SURFACE ANTIBODY,QUALITATIVE: Hep B S Ab: NONREACTIVE

## 2023-10-25 LAB — ANTI-SMOOTH MUSCLE ANTIBODY, IGG: Actin (Smooth Muscle) Antibody (IGG): <20 U (ref <20)

## 2023-10-25 LAB — ANA: Anti Nuclear Antibody (ANA): POSITIVE — AB

## 2023-10-25 LAB — HEPATITIS C ANTIBODY: Hepatitis C Ab: NONREACTIVE

## 2023-10-25 LAB — HEPATITIS B SURFACE ANTIGEN: Hepatitis B Surface Ag: NONREACTIVE

## 2023-10-25 LAB — AFP TUMOR MARKER: AFP-Tumor Marker: 2.1 ng/mL (ref <6.1)

## 2023-10-25 LAB — IGG: IgG (Immunoglobin G), Serum: 1066 mg/dL (ref 600–1540)

## 2023-10-25 LAB — ANTI-NUCLEAR AB-TITER (ANA TITER): ANA Titer 1: 1:40 {titer} — ABNORMAL HIGH

## 2023-10-25 LAB — ALPHA-1-ANTITRYPSIN: A-1 Antitrypsin, Ser: 198 mg/dL (ref 83–199)

## 2023-10-25 LAB — MITOCHONDRIAL ANTIBODIES: Mitochondrial M2 Ab, IgG: <=20 U (ref <=20.0)

## 2023-10-27 ENCOUNTER — Encounter: Payer: Self-pay | Admitting: Nurse Practitioner

## 2023-10-27 ENCOUNTER — Ambulatory Visit: Attending: Nurse Practitioner | Admitting: Nurse Practitioner

## 2023-10-27 VITALS — BP 130/54 | HR 62 | Ht 69.0 in | Wt 174.0 lb

## 2023-10-27 DIAGNOSIS — I5020 Unspecified systolic (congestive) heart failure: Secondary | ICD-10-CM | POA: Diagnosis not present

## 2023-10-27 DIAGNOSIS — I10 Essential (primary) hypertension: Secondary | ICD-10-CM | POA: Insufficient documentation

## 2023-10-27 DIAGNOSIS — G4733 Obstructive sleep apnea (adult) (pediatric): Secondary | ICD-10-CM | POA: Insufficient documentation

## 2023-10-27 DIAGNOSIS — E785 Hyperlipidemia, unspecified: Secondary | ICD-10-CM | POA: Insufficient documentation

## 2023-10-27 DIAGNOSIS — I779 Disorder of arteries and arterioles, unspecified: Secondary | ICD-10-CM | POA: Insufficient documentation

## 2023-10-27 DIAGNOSIS — I2510 Atherosclerotic heart disease of native coronary artery without angina pectoris: Secondary | ICD-10-CM | POA: Diagnosis not present

## 2023-10-27 DIAGNOSIS — E1165 Type 2 diabetes mellitus with hyperglycemia: Secondary | ICD-10-CM | POA: Insufficient documentation

## 2023-10-27 DIAGNOSIS — R011 Cardiac murmur, unspecified: Secondary | ICD-10-CM | POA: Diagnosis present

## 2023-10-27 NOTE — Patient Instructions (Addendum)
 Medication Instructions:  Your physician recommends that you continue on your current medications as directed. Please refer to the Current Medication list given to you today.  *If you need a refill on your cardiac medications before your next appointment, please call your pharmacy*  Lab Work: NONE ordered at this time of appointment   Testing/Procedures: Your physician has requested that you have an echocardiogram in 3-4 months. Echocardiography is a painless test that uses sound waves to create images of your heart. It provides your doctor with information about the size and shape of your heart and how well your heart's chambers and valves are working. This procedure takes approximately one hour. There are no restrictions for this procedure. Please do NOT wear cologne, perfume, aftershave, or lotions (deodorant is allowed). Please arrive 15 minutes prior to your appointment time.  Please note: We ask at that you not bring children with you during ultrasound (echo/ vascular) testing. Due to room size and safety concerns, children are not allowed in the ultrasound rooms during exams. Our front office staff cannot provide observation of children in our lobby area while testing is being conducted. An adult accompanying a patient to their appointment will only be allowed in the ultrasound room at the discretion of the ultrasound technician under special circumstances. We apologize for any inconvenience.    Follow-Up: At [facility], you and your health needs are our priority.  As part of our continuing mission to provide you with exceptional heart care, our providers are all part of one team.  This team includes your primary Cardiologist (physician) and Advanced Practice Providers or APPs (Physician Assistants and Nurse Practitioners) who all work together to provide you with the care you need, when you need it.  Your next appointment:   4-6 month(s)  Provider:   Dr. Barbaraann     We  recommend signing up for the patient portal called MyChart.  Sign up information is provided on this After Visit Summary.  MyChart is used to connect with patients for Virtual Visits (Telemedicine).  Patients are able to view lab/test results, encounter notes, upcoming appointments, etc.  Non-urgent messages can be sent to your provider as well.   To learn more about what you can do with MyChart, go to [email or web address].

## 2023-10-27 NOTE — Progress Notes (Addendum)
 Office Visit    Patient Name: Robert Arnold. 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   Past Medical History    Past Medical History:  Diagnosis Date   Adenomatous colon polyp    Allergy    BPH (benign prostatic hyperplasia)    CAD (coronary artery disease)    a. NSTEMI 09/2011 s/p PCI to LAD. b. CABG 01/2012 @ [facility].   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   Trifascicular block    Past Surgical History:  Procedure Laterality Date   ANTERIOR CERVICAL DECOMP/DISCECTOMY FUSION N/A 05/04/2018   Procedure: Cervical Five-Six Anterior cervical decompression/discectomy/fusion;  Surgeon: Alix Charleston, MD;  Location: [facility] OR;  Service: Neurosurgery;  Laterality: N/A;  Cervical Five-Six Anterior cervical decompression/discectomy/fusion   APPENDECTOMY     AV FISTULA PLACEMENT  2011   Left forearm    COLONOSCOPY     CORONARY ANGIOPLASTY WITH STENT PLACEMENT     CORONARY ARTERY BYPASS GRAFT  2013   KIDNEY TRANSPLANT  09/25/2011   LAPAROSCOPIC APPENDECTOMY N/A 04/06/2021   Procedure: APPENDECTOMY LAPAROSCOPIC;  Surgeon: Ebbie Cough, MD;  Location: [facility] OR;  Service: General;  Laterality: N/A;   PERITONEAL CATHETER INSERTION  2011    Allergies  Allergies  Allergen Reactions   Losartan Anaphylaxis   Tape Other (See Comments)    SKIN IS VERY DELICATE AND WILL TEAR AND BRUISE EASILY!!!   Morphine  Other (See Comments)    This is INEFFECTIVE   Crestor [Rosuvastatin] Other (See Comments)    Elevated LFT's   Lorazepam Other (See Comments)    Pt is unsure of reaction      Labs/Other Studies Reviewed    The following studies were reviewed today:  Cardiac Studies & Procedures   ______________________________________________________________________________________________   STRESS TESTS  MYOCARDIAL PERFUSION IMAGING 06/06/2021  Interpretation Summary   Findings are consistent with prior myocardial infarction. The study is high risk.   No ST deviation was noted.   LV perfusion is abnormal. Defect 1: There is a medium defect with moderate reduction in uptake present in the apical to basal anterolateral location(s) that is fixed.   Left ventricular function is abnormal. Global function is severely reduced. Nuclear stress EF: 38 %. The left ventricular ejection fraction is moderately decreased (30-44%). End diastolic cavity size is severely enlarged. End systolic cavity size is severely enlarged.  Prior study available for comparison from 01/08/2012.  High risk scan due to LvE and EF 38% Diffuse hypokinesis worse in the anterolateral wall Fixed infarct in anterolateral wall from apex to base no ischemia ? ECG shows sinus with very long first degree block   ECHOCARDIOGRAM  ECHOCARDIOGRAM COMPLETE 09/12/2023  Narrative ECHOCARDIOGRAM REPORT    Patient Name:   Robert Arnold. Date of Exam: 09/12/2023 Medical Rec #:  [id number]           Height:       67.5 in Accession #:    [id number]          Weight:       167.5 lb Date of Birth:  [birth year]          BSA:          1.886 m Patient Age:    77 years            BP:           134/69 mmHg Patient Gender: M                   HR:           62 bpm. Exam Location:  Inpatient  Procedure: 2D Echo, Cardiac Doppler and Color Doppler (Both Spectral and Color Flow Doppler were utilized during procedure).  Indications:    CHF I50.9  History:        Patient has prior history of Echocardiogram examinations, most recent 04/10/2021.  Sonographer:    Tinnie Gosling RDCS Referring Phys: [id number] RAMESH KC  IMPRESSIONS   1. Global hypokinesis worse in the posterior lateral wall and septum. Left ventricular ejection fraction, by estimation, is 35 to 40%. The left ventricle has moderately decreased function. The left ventricle demonstrates global hypokinesis. The left ventricular internal cavity size was moderately dilated. There is moderate left ventricular hypertrophy. Left ventricular diastolic parameters were normal. 2. Right ventricular systolic function is normal. The right ventricular size is normal. 3. Left atrial size was moderately dilated. 4. The mitral valve is abnormal. Trivial mitral valve regurgitation. No evidence of mitral stenosis. 5. Calcified mean gradient 6 peak 9 mmHg but AVA 2.5 cm2. The aortic valve is tricuspid. There is moderate calcification of the aortic valve. There is moderate thickening of the aortic valve. Aortic valve regurgitation is trivial. Aortic valve sclerosis/calcification is present, without any evidence of aortic stenosis. 6. The inferior vena cava is normal in size with greater than 50% respiratory variability, suggesting right atrial pressure of 3 mmHg.  FINDINGS Left Ventricle: Global hypokinesis worse in the posterior lateral wall and septum. Left ventricular ejection fraction, by  estimation, is 35 to 40%. The left ventricle has moderately decreased function. The left ventricle demonstrates global hypokinesis. Strain was performed and the global longitudinal strain is indeterminate. The left ventricular internal cavity size was moderately dilated. There is moderate left ventricular hypertrophy. Left ventricular diastolic parameters were normal.  Right Ventricle: The right ventricular size is normal. No increase in right ventricular wall thickness. Right ventricular systolic function is normal.  Left Atrium: Left atrial size was moderately dilated.  Right Atrium: Right atrial size was normal in size.  Pericardium: There is no evidence of pericardial effusion.  Mitral Valve: The mitral valve is abnormal. There is mild thickening of the mitral valve leaflet(s). There is mild calcification of the mitral valve leaflet(s). Mild mitral annular calcification. Trivial mitral valve regurgitation. No evidence of mitral valve stenosis.  Tricuspid Valve:  The tricuspid valve is normal in structure. Tricuspid valve regurgitation is trivial. No evidence of tricuspid stenosis.  Aortic Valve: Calcified mean gradient 6 peak 9 mmHg but AVA 2.5 cm2. The aortic valve is tricuspid. There is moderate calcification of the aortic valve. There is moderate thickening of the aortic valve. Aortic valve regurgitation is trivial. Aortic valve sclerosis/calcification is present, without any evidence of aortic stenosis. Aortic valve mean gradient measures 9.0 mmHg. Aortic valve peak gradient measures 15.9 mmHg. Aortic valve area, by VTI measures 2.62 cm.  Pulmonic Valve: The pulmonic valve was normal in structure. Pulmonic valve regurgitation is not visualized. No evidence of pulmonic stenosis.  Aorta: The aortic root is normal in size and structure.  Venous: The inferior vena cava is normal in size with greater than 50% respiratory variability, suggesting right atrial pressure of 3  mmHg.  IAS/Shunts: No atrial level shunt detected by color flow Doppler.  Additional Comments: 3D was performed not requiring image post processing on an independent workstation and was indeterminate.   LEFT VENTRICLE PLAX 2D LVIDd:         5.50 cm   Diastology LVIDs:         4.00 cm   LV e' lateral:   8.67 cm/s LV PW:         1.30 cm   LV E/e' lateral: 11.3 LV IVS:        1.30 cm LVOT diam:     2.20 cm LV SV:         101 LV SV Index:   54 LVOT Area:     3.80 cm   RIGHT VENTRICLE            IVC RV S prime:     6.41 cm/s  IVC diam: 2.10 cm TAPSE (M-mode): 1.2 cm  LEFT ATRIUM             Index LA diam:        4.40 cm 2.33 cm/m LA Vol (A2C):   62.3 ml 33.03 ml/m LA Vol (A4C):   71.2 ml 37.75 ml/m LA Biplane Vol: 72.8 ml 38.60 ml/m AORTIC VALVE AV Area (Vmax):    2.92 cm AV Area (Vmean):   3.02 cm AV Area (VTI):     2.62 cm AV Vmax:           199.50 cm/s AV Vmean:          141.000 cm/s AV VTI:            0.387 m AV Peak Grad:      15.9 mmHg AV Mean Grad:      9.0 mmHg LVOT Vmax:         153.00 cm/s LVOT Vmean:        112.000 cm/s LVOT VTI:          0.267 m LVOT/AV VTI ratio: 0.69  AORTA Ao Root diam: 3.40 cm Ao Asc diam:  3.50 cm  MITRAL VALVE MV Area (PHT): 4.06 cm    SHUNTS MV Decel Time: 187 msec    Systemic VTI:  0.27 m MV E velocity: 98.10 cm/s  Systemic Diam: 2.20 cm MV A velocity: 91.30 cm/s MV E/A ratio:  1.07  Maude Emmer MD Electronically signed by Maude Emmer MD Signature Date/Time: 09/12/2023/1:58:20 PM    Final          ______________________________________________________________________________________________     Recent Labs: 04/23/2023: Magnesium  2.2; TSH 2.05 09/11/2023: ALT 51 09/22/2023: B Natriuretic Peptide 947.1; Hemoglobin 13.9;  Platelets 143 10/02/2023: BUN 43; Creatinine, Ser 1.30; Potassium 4.6; Sodium 142  Recent Lipid Panel    Component Value Date/Time   CHOL 130 04/23/2023 1034   CHOL 158 03/15/2013 0744    TRIG 48 04/23/2023 1034   HDL 64 04/23/2023 1034   HDL 61 03/15/2013 0744   CHOLHDL 2.0 04/23/2023 1034   VLDL 17 09/09/2016 1042   LDLCALC 52 04/23/2023 1034    History of Present Illness    78 year old male with the above past medical history including CAD s/p DES-LAD in 09/2011, s/p CABG x4 (performed at [facility], unclear anatomy per chart review), ICM, chronic HFrEF, paroxysmal atrial fibrillation, trifascicular block, carotid artery stenosis, hypertension, hyperlipidemia, 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    He has a history of CAD with prior stenting, CABG x 4 in 2013.  Developed postop atrial fibrillation.  He was started on Coumadin .  He has a history of trifascicular heart block.  Echocardiogram in 2023 showed EF 45 to 50%, moderate aortic insufficiency, mild aortic stenosis, mild dilation of ascending aorta measuring 41 mm.  Previously unable to afford Entresto .  Myoview  in 06/2021 showed evidence of prior infarction, no reversible ischemia.  He has a history of mild bilateral carotid artery stenosis.  Most recent carotid duplex in 08/2023 showed 1 to 39% B ICA stenosis.  He was hospitalized from 09/11/2023 to 09/12/2023 in the setting of acute on chronic systolic heart failure.  BNP was elevated.  Troponin was elevated with flat trend.  Echocardiogram showed EF 35 to 40%, moderately decreased LV function, moderate LVH, normal RV systolic function, no significant valvular abnormalities.  He was diuresed with IV Lasix . He returned to the ED on 09/22/2023 with concern for increased dyspnea, orthopnea.  Lasix  was increased to 40 mg daily. He was last seen in the office on 09/25/2023 and reported stable mild dyspnea, overall improved, intermittent lightheadedness.  Lasix  was increased to 60 mg daily.   He presents today for follow-up accompanied by his wife. Since his last visit he has done well from a cardiac standpoint.  He does note some  occasional lightheadedness with position changes, he denies any significant palpitations, presyncope, syncope. He reports stable dyspnea on exertion, occasional orthopnea, generalized fatigue.  He also reports mild gait instability.  He did trip and fall recently, he denies any head injury.  He is following with GI in the setting of cirrhosis, pending MRI and endoscopy.   Home Medications    Current Outpatient Medications  Medication Sig Dispense Refill   allopurinol  (ZYLOPRIM ) 300 MG tablet TAKE 1 TABLET BY MOUTH DAILY TO PREVENT GOUT 90 tablet 3   atorvastatin  (LIPITOR) 40 MG tablet Take 1 tab daily for cholesterol. (Patient taking differently: Take 40 mg by mouth in the morning.) 90 tablet 0   brimonidine  (ALPHAGAN ) 0.2 % ophthalmic solution Place 1 drop into both eyes 2 (two) times daily.     buPROPion  (WELLBUTRIN  XL) 300 MG 24 hr tablet Take  1 tablet  Daily for Mood, Focus & Concentration                                         /         TAKE  BY                      MOUTH (Patient taking differently: Take 300 mg by mouth in the morning.) 90 tablet 3   Cyanocobalamin (VITAMIN B12 PO) Take 1 tablet by mouth daily.     finasteride  (PROSCAR ) 5 MG tablet TAKE 1 TABLET BY MOUTH DAILY FOR PROSTATE 90 tablet 3   furosemide  (LASIX ) 40 MG tablet Take 1.5 tablets (60 mg total) by mouth daily. 30 tablet 1   gabapentin  (NEURONTIN ) 300 MG capsule Take  1 capsule  3 x /day for  Pain                                                                                              /                                                                   TAKE                                         BY                                                 MOUTH (Patient taking differently: Take 300 mg by mouth at bedtime.) 270 capsule 3   glucose blood (FREESTYLE TEST STRIPS) test strip Check blood sugar 3 to 4 times daily for medication regulation. 450 each PRN   latanoprost  (XALATAN ) 0.005 % ophthalmic  solution Place 1 drop into both eyes at bedtime.   4   Magnesium  500 MG TABS Take 1 tablet (500 mg total) by mouth daily. 30 tablet    modafinil  (PROVIGIL ) 200 MG tablet Take 1 tablet (200 mg total) by mouth daily. 90 tablet 1   mycophenolate  (MYFORTIC ) 360 MG TBEC EC tablet TAKE 1 TABLET BY MOUTH TWICE DAILY 180 tablet 1   NOVOLOG  FLEXPEN 100 UNIT/ML FlexPen Inject 2-3 Units into the skin See admin instructions. Inject 2-3 units into the skin three times a day before meals, per sliding scale     tacrolimus  (PROGRAF ) 0.5 MG capsule Take 0.5 mg by mouth every 12 (twelve) hours.     tacrolimus  (PROGRAF ) 1 MG capsule Take 1 mg by mouth every 12 (twelve) hours.     tamsulosin  (FLOMAX ) 0.4 MG CAPS capsule Take 1 capsule (0.4 mg total) by mouth daily. (Patient taking differently: Take 0.4 mg by mouth at bedtime.) 90 capsule 2   traZODone  (DESYREL ) 50 MG tablet TAKE ONE TABLET BY MOUTH DAILY ONE HOUR BEFORE BEDTIME AS NEEDED FOR SLEEP (Patient taking differently: Take 50 mg by mouth at bedtime.) 90  tablet 3   valsartan  (DIOVAN ) 80 MG tablet TAKE 1 TABLET BY MOUTH DAILY 90 tablet 1   warfarin (COUMADIN ) 5 MG tablet Takes 5mg  by mouth daily Monday- Saturday, reduce dose to 2.5 mg every sunday (Patient taking differently: Take 5 mg by mouth in the morning.)     pantoprazole  (PROTONIX ) 40 MG tablet Take 40 mg by mouth daily before breakfast.     No current facility-administered medications for this visit.     Review of Systems   He denies chest pain, palpitations, pnd, orthopnea, n, v, dizziness, syncope, edema, weight gain, or early satiety. All other systems reviewed and are otherwise negative except as noted above.   Physical Exam    VS:  BP (!) 130/54 (BP Location: Right Arm, Patient Position: Sitting)   Pulse 62   Ht 5' 9 (1.753 m)   Wt 174 lb (78.9 kg)   SpO2 96%   BMI 25.70 kg/m  GEN: Well nourished, well developed, in no acute distress. HEENT: normal. Neck: Supple, no JVD, carotid  bruits, or masses. Cardiac: RRR, 3/6 murmur, no rubs, or gallops. No clubbing, cyanosis, edema.  Radials/DP/PT 2+ and equal bilaterally.  Respiratory:  Respirations regular and unlabored, clear to auscultation bilaterally. GI: Soft, nontender, nondistended, BS + x 4. MS: no deformity or atrophy. Skin: warm and dry, no rash. Neuro:  Strength and sensation are intact. Psych: Normal affect.  Accessory Clinical Findings    ECG personally reviewed by me today - EKG Interpretation Date/Time:  Monday October 27 2023 11:08:16 EDT Ventricular Rate:  62 PR Interval:  480 QRS Duration:  142 QT Interval:  450 QTC Calculation: 456 R Axis:   -81  Text Interpretation: Sinus rhythm with sinus arrhythmia with 1st degree A-V block Right bundle branch block Left anterior fascicular block Bifascicular block Minimal voltage criteria for LVH, may be normal variant ( R in aVL ) Septal infarct , age undetermined When compared with ECG of 25-Sep-2023 08:48, Sinus rhythm has replaced Wide QRS rhythm Vent. rate has decreased BY  32 BPM Confirmed by Daneen Perkins ([id number]) on 10/27/2023 11:17:04 AM  - no acute changes.   Lab Results  Component Value Date   WBC 8.2 09/22/2023   HGB 13.9 09/22/2023   HCT 43.7 09/22/2023   MCV 100.7 (H) 09/22/2023   PLT 143 (L) 09/22/2023   Lab Results  Component Value Date   CREATININE 1.30 (H) 10/02/2023   BUN 43 (H) 10/02/2023   NA 142 10/02/2023   K 4.6 10/02/2023   CL 102 10/02/2023   CO2 24 10/02/2023   Lab Results  Component Value Date   ALT 51 (H) 09/11/2023   AST 52 (H) 09/11/2023   ALKPHOS 138 (H) 09/11/2023   BILITOT 1.5 (H) 09/11/2023   Lab Results  Component Value Date   CHOL 130 04/23/2023   HDL 64 04/23/2023   LDLCALC 52 04/23/2023   TRIG 48 04/23/2023   CHOLHDL 2.0 04/23/2023    Lab Results  Component Value Date   HGBA1C 5.7 (H) 04/23/2023    Assessment & Plan    1. Chronic HFrEF/murmur: Prior EF 45 to 50%. Recent hospitalization in the setting  of acute on chronic systolic heart failure.  BNP was elevated.  Troponin was elevated with flat trend.  Echocardiogram showed EF 35 to 40%, moderately decreased LV function, moderate LVH, normal RV systolic function, no significant valvular abnormalities.  He continues to note dyspnea on exertion, intermittent orthopnea, generalized fatigue.  Euvolemic and well  compensated on exam. Previously unable to afford SGLT2 inhibitor. Spironolactone  was discontinued due to drug-drug interaction with Prograf .  He is not on beta-blocker therapy in the setting of heart block.  Entresto  was cost prohibitive.  His PCP told him that may he may require a pacemaker in the future, he does not meet criteria for CRT therapy at this time. He does have a murmur on exam today.  Prior echo with no significant valvular disease.  Will update echocardiogram prior to next office visit.  Reviewed daily weights, ED precautions.  Continue valsartan , Lasix .   2. CAD: S/p DES-LAD in 09/2011, s/p CABG x4 (performed at [facility], unclear anatomy per chart review).  Recent elevation in troponin with flat trend, most recent echocardiogram with decreased EF as above. He has mild dyspnea on exertion, generalized fatigue. He denies chest pain.  Repeat echo pending as above, if symptoms progress, consider need for ischemic evaluation, will defer for now.  No ASA in the setting of chronic warfarin.  Continue valsartan , Lipitor.   3. Paroxysmal atrial fibrillation/Trifascicular block: Maintaining sinus rhythm. Denies any palpitations, dizziness, presyncope or syncope.  Continue warfarin, this has been monitored by his PCP.   4. Carotid artery stenosis: Most recent carotid duplex in 08/2023 showed 1 to 39% B ICA stenosis.  Consider repeat study as clinically indicated.  Continue Lipitor.   5. Hypertension: BP well controlled. Continue current antihypertensive regimen.    6. Hyperlipidemia: LDL was 52 in 04/2023.  Continue Lipitor.   7. ESRD: Secondary to  IgA nephropathy.  S/p renal transplant (his grandson was his kidney donor). Followed by nephrology.    8. Type 2 diabetes: A1c was 5.7 in 04/2023. Followed by endocrinology.    9. Cirrhosis: Followed by GI. Pending MRI and endoscopy. He remains on statin therapy, however, if GI recommends discontinuation of statin therapy, consider need for PCSK9 inhibitor.   9. OSA: Not on CPAP.    10. Disposition: Former patient of Dr. Alveta.  Will have him establish with Dr. Barbaraann.  Follow-up in 4 to 6 months, sooner if needed.      Damien JAYSON Braver, NP 10/27/2023, 12:23 PM

## 2023-10-27 NOTE — Telephone Encounter (Signed)
 Patient returning call.

## 2023-10-28 NOTE — Telephone Encounter (Signed)
 Called patient and left another message to call back to discuss hep A and Heplisav (Hep B) vaccines. He can schedule a nurse visit for the 1st Hep A and Hep B injection if he calls back

## 2023-10-30 ENCOUNTER — Ambulatory Visit (HOSPITAL_COMMUNITY)
Admission: RE | Admit: 2023-10-30 | Discharge: 2023-10-30 | Disposition: A | Discharge status: Home / Self Care | Source: Ambulatory Visit | Attending: Gastroenterology | Admitting: Gastroenterology

## 2023-10-30 DIAGNOSIS — K7460 Unspecified cirrhosis of liver: Secondary | ICD-10-CM | POA: Insufficient documentation

## 2023-10-30 DIAGNOSIS — Z7901 Long term (current) use of anticoagulants: Secondary | ICD-10-CM | POA: Diagnosis present

## 2023-11-03 ENCOUNTER — Ambulatory Visit: Payer: Medicare Other | Admitting: Internal Medicine

## 2023-11-04 ENCOUNTER — Other Ambulatory Visit: Payer: Self-pay | Admitting: Nurse Practitioner

## 2023-11-06 ENCOUNTER — Telehealth: Payer: Self-pay

## 2023-11-06 ENCOUNTER — Ambulatory Visit (INDEPENDENT_AMBULATORY_CARE_PROVIDER_SITE_OTHER)

## 2023-11-06 DIAGNOSIS — Z23 Encounter for immunization: Secondary | ICD-10-CM

## 2023-11-06 DIAGNOSIS — K7460 Unspecified cirrhosis of liver: Secondary | ICD-10-CM

## 2023-11-06 NOTE — Telephone Encounter (Signed)
 Called and LM for patient to see if he can come at 6 am instead of 6:30 am for his EGD on 8-14 at [facility]. Asked patient to call back to confirm

## 2023-11-06 NOTE — Telephone Encounter (Signed)
 Great, thanks. I would confirm with endo they are okay for 730 start, just to make sure there is nothing prohibiting a 730 start that day. Thank you!

## 2023-11-10 ENCOUNTER — Other Ambulatory Visit (HOSPITAL_BASED_OUTPATIENT_CLINIC_OR_DEPARTMENT_OTHER): Payer: Self-pay

## 2023-11-10 DIAGNOSIS — I480 Paroxysmal atrial fibrillation: Secondary | ICD-10-CM

## 2023-11-10 DIAGNOSIS — I5020 Unspecified systolic (congestive) heart failure: Secondary | ICD-10-CM

## 2023-11-10 DIAGNOSIS — I2510 Atherosclerotic heart disease of native coronary artery without angina pectoris: Secondary | ICD-10-CM

## 2023-11-10 DIAGNOSIS — I10 Essential (primary) hypertension: Secondary | ICD-10-CM

## 2023-11-10 DIAGNOSIS — Z79899 Other long term (current) drug therapy: Secondary | ICD-10-CM

## 2023-11-10 NOTE — Telephone Encounter (Signed)
 Called and confirmed OK to move patient to 7:30 am.

## 2023-11-10 NOTE — Telephone Encounter (Signed)
 Return to Lasix  60mg  daily. Take additional 20mg  only AS NEEDED for weight gain of 2 lbs overnight or 5 lbs in one week. Recommend udpated BMET/BNP this week or next to assess volume status and kidney function.  Robert Arnold S Milfred Krammes, NP

## 2023-11-10 NOTE — Telephone Encounter (Signed)
 Mychart message sent to pt with recommendations. Lab orders placed.

## 2023-11-12 ENCOUNTER — Encounter (HOSPITAL_COMMUNITY): Payer: Self-pay | Admitting: Gastroenterology

## 2023-11-12 ENCOUNTER — Telehealth: Payer: Self-pay | Admitting: Gastroenterology

## 2023-11-12 NOTE — Telephone Encounter (Signed)
 Noted. Thanks.

## 2023-11-12 NOTE — Telephone Encounter (Signed)
 Patient called back and stated that yes is aware the he has a procedure scheduled for August the 14 th at the [facility]. Patient was informed that his arrival time is at 6 am. Patient has no concern in regards to his prep or procedure. Patient does have a Care partner and transportation for this day. Patient has no additional concerns.

## 2023-11-12 NOTE — Telephone Encounter (Signed)
 Procedure:Endoscopy Procedure date: 11/20/23 Procedure location: [facility] Arrival Time: 6:00 am Spoke with the patient Y/N:   No, I left a detailed message on [phone] on 11/12/23 @ 9:13 am for the patient to return call    Any prep concerns? ___  Has the patient obtained the prep from the pharmacy ? No prep needed Do you have a care partner and transportation: ___ Any additional concerns? ___

## 2023-11-20 ENCOUNTER — Other Ambulatory Visit: Payer: Self-pay

## 2023-11-20 ENCOUNTER — Encounter (HOSPITAL_COMMUNITY): Payer: Self-pay | Admitting: Gastroenterology

## 2023-11-20 ENCOUNTER — Ambulatory Visit (HOSPITAL_COMMUNITY)
Admission: RE | Admit: 2023-11-20 | Discharge: 2023-11-20 | Disposition: A | Discharge status: Home / Self Care | Attending: Gastroenterology | Admitting: Gastroenterology

## 2023-11-20 ENCOUNTER — Ambulatory Visit (HOSPITAL_COMMUNITY): Admitting: Anesthesiology

## 2023-11-20 ENCOUNTER — Ambulatory Visit: Admitting: Orthopedic Surgery

## 2023-11-20 ENCOUNTER — Encounter (HOSPITAL_COMMUNITY)
Admission: RE | Disposition: A | Discharge status: Home / Self Care | Payer: Self-pay | Source: Home / Self Care | Attending: Gastroenterology

## 2023-11-20 ENCOUNTER — Ambulatory Visit (HOSPITAL_BASED_OUTPATIENT_CLINIC_OR_DEPARTMENT_OTHER): Admitting: Anesthesiology

## 2023-11-20 ENCOUNTER — Ambulatory Visit (HOSPITAL_BASED_OUTPATIENT_CLINIC_OR_DEPARTMENT_OTHER): Payer: Self-pay | Admitting: Family

## 2023-11-20 DIAGNOSIS — Z940 Kidney transplant status: Secondary | ICD-10-CM | POA: Insufficient documentation

## 2023-11-20 DIAGNOSIS — I480 Paroxysmal atrial fibrillation: Secondary | ICD-10-CM | POA: Diagnosis not present

## 2023-11-20 DIAGNOSIS — Z87891 Personal history of nicotine dependence: Secondary | ICD-10-CM | POA: Insufficient documentation

## 2023-11-20 DIAGNOSIS — N186 End stage renal disease: Secondary | ICD-10-CM | POA: Diagnosis not present

## 2023-11-20 DIAGNOSIS — Z794 Long term (current) use of insulin: Secondary | ICD-10-CM | POA: Diagnosis not present

## 2023-11-20 DIAGNOSIS — I8510 Secondary esophageal varices without bleeding: Secondary | ICD-10-CM

## 2023-11-20 DIAGNOSIS — I2510 Atherosclerotic heart disease of native coronary artery without angina pectoris: Secondary | ICD-10-CM | POA: Diagnosis not present

## 2023-11-20 DIAGNOSIS — I132 Hypertensive heart and chronic kidney disease with heart failure and with stage 5 chronic kidney disease, or end stage renal disease: Secondary | ICD-10-CM | POA: Insufficient documentation

## 2023-11-20 DIAGNOSIS — F32A Depression, unspecified: Secondary | ICD-10-CM | POA: Diagnosis not present

## 2023-11-20 DIAGNOSIS — I252 Old myocardial infarction: Secondary | ICD-10-CM

## 2023-11-20 DIAGNOSIS — Z13810 Encounter for screening for upper gastrointestinal disorder: Secondary | ICD-10-CM | POA: Insufficient documentation

## 2023-11-20 DIAGNOSIS — N189 Chronic kidney disease, unspecified: Secondary | ICD-10-CM | POA: Diagnosis not present

## 2023-11-20 DIAGNOSIS — I8500 Esophageal varices without bleeding: Secondary | ICD-10-CM | POA: Diagnosis not present

## 2023-11-20 DIAGNOSIS — I5042 Chronic combined systolic (congestive) and diastolic (congestive) heart failure: Secondary | ICD-10-CM

## 2023-11-20 DIAGNOSIS — G4733 Obstructive sleep apnea (adult) (pediatric): Secondary | ICD-10-CM | POA: Diagnosis not present

## 2023-11-20 DIAGNOSIS — K7460 Unspecified cirrhosis of liver: Secondary | ICD-10-CM | POA: Diagnosis not present

## 2023-11-20 DIAGNOSIS — I350 Nonrheumatic aortic (valve) stenosis: Secondary | ICD-10-CM | POA: Insufficient documentation

## 2023-11-20 DIAGNOSIS — Z7901 Long term (current) use of anticoagulants: Secondary | ICD-10-CM | POA: Insufficient documentation

## 2023-11-20 DIAGNOSIS — Z79899 Other long term (current) drug therapy: Secondary | ICD-10-CM

## 2023-11-20 DIAGNOSIS — E1122 Type 2 diabetes mellitus with diabetic chronic kidney disease: Secondary | ICD-10-CM | POA: Insufficient documentation

## 2023-11-20 DIAGNOSIS — I10 Essential (primary) hypertension: Secondary | ICD-10-CM

## 2023-11-20 DIAGNOSIS — I509 Heart failure, unspecified: Secondary | ICD-10-CM | POA: Insufficient documentation

## 2023-11-20 DIAGNOSIS — I5022 Chronic systolic (congestive) heart failure: Secondary | ICD-10-CM

## 2023-11-20 HISTORY — PX: ESOPHAGOGASTRODUODENOSCOPY: SHX5428

## 2023-11-20 LAB — BASIC METABOLIC PANEL WITH GFR
BUN/Creatinine Ratio: 32 — ABNORMAL HIGH (ref 10–24)
BUN: 46 mg/dL — ABNORMAL HIGH (ref 8–27)
Creatinine, Ser: 1.42 mg/dL — ABNORMAL HIGH (ref 0.76–1.27)
Glucose: 152 mg/dL — ABNORMAL HIGH (ref 70–99)
eGFR: 52 mL/min/1.73 — ABNORMAL LOW (ref >59)

## 2023-11-20 LAB — GLUCOSE, CAPILLARY: Glucose-Capillary: 143 mg/dL — ABNORMAL HIGH (ref 70–99)

## 2023-11-20 LAB — BRAIN NATRIURETIC PEPTIDE: BNP: 208.9 pg/mL — ABNORMAL HIGH (ref 0.0–100.0)

## 2023-11-20 SURGERY — EGD (ESOPHAGOGASTRODUODENOSCOPY)
Anesthesia: Monitor Anesthesia Care

## 2023-11-20 MED ORDER — SODIUM CHLORIDE 0.9 % IV SOLN: INTRAVENOUS | Status: DC

## 2023-11-20 MED ORDER — PROPOFOL 500 MG/50ML IV EMUL
INTRAVENOUS | Status: DC | PRN
  Administered 2023-11-20: 20 mg via INTRAVENOUS
  Administered 2023-11-20: 180 ug/kg/min via INTRAVENOUS
  Administered 2023-11-20: 20 mg via INTRAVENOUS

## 2023-11-20 MED ORDER — PHENYLEPHRINE 80 MCG/ML (10ML) SYRINGE FOR IV PUSH (FOR BLOOD PRESSURE SUPPORT)
PREFILLED_SYRINGE | INTRAVENOUS | Status: DC | PRN
  Administered 2023-11-20: 160 ug via INTRAVENOUS

## 2023-11-20 NOTE — Transfer of Care (Signed)
 Immediate Anesthesia Transfer of Care Note  Patient: Robert E Hsu Jr.  Procedure(s) Performed: EGD (ESOPHAGOGASTRODUODENOSCOPY)  Patient Location: PACU  Anesthesia Type:MAC  Level of Consciousness: drowsy  Airway & Oxygen Therapy: Patient Spontanous Breathing  Post-op Assessment: Report given to RN  Post vital signs: Reviewed and stable  Last Vitals:  Vitals Value Taken Time  BP 96/49 11/20/23 07:45  Temp    Pulse 64 11/20/23 07:48  Resp 22 11/20/23 07:48  SpO2 91 % 11/20/23 07:48  Vitals shown include unfiled device data.  Last Pain:  Vitals:   11/20/23 0658  TempSrc: Temporal  PainSc: 0-No pain         Complications: No notable events documented.

## 2023-11-20 NOTE — H&P (Signed)
 [facility] History and Physical   Primary Care Physician:  Chrystal Lamarr RAMAN, MD   Reason for Procedure:   Screening for varices / cirrhosis  Plan:    EGD     HPI: Robert Allerton. is a 78 y.o. male  here for EGD to screen for varices. He has cirrhosis, also on coumadin  for history of AF. He has some mild baseline thrombocytopenia. No prior EGD. EGD done to screen for varices in the setting of coumadin  use to stratify his risk for bleeding if he continues coumadin . Case done ON coumadin  today. Case done at the hospital for anesthesia support given his comorbidities and history of CHF. Doing better in this regard, on diuretics his CHF has improved in recent months. Otherwise feels well without any cardiopulmonary symptoms today.  I have discussed risks / benefits of anesthesia and endoscopic procedure with Robert E Ehrich Jr. and they wish to proceed with the exams as outlined today.    Past Medical History:  Diagnosis Date   Adenomatous colon polyp    Allergy    BPH (benign prostatic hyperplasia)    CAD (coronary artery disease)    a. NSTEMI 09/2011 s/p PCI to LAD. b. CABG 01/2012 @ [facility].   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   Trifascicular block     Past Surgical History:  Procedure Laterality Date   ANTERIOR CERVICAL DECOMP/DISCECTOMY FUSION N/A 05/04/2018   Procedure: Cervical  Five-Six Anterior cervical decompression/discectomy/fusion;  Surgeon: Alix Charleston, MD;  Location: [facility] OR;  Service: Neurosurgery;  Laterality: N/A;  Cervical Five-Six Anterior cervical decompression/discectomy/fusion   APPENDECTOMY     AV FISTULA PLACEMENT  2011   Left forearm   COLONOSCOPY     CORONARY ANGIOPLASTY WITH STENT PLACEMENT     CORONARY ARTERY BYPASS GRAFT  2013   KIDNEY TRANSPLANT  09/25/2011   LAPAROSCOPIC APPENDECTOMY N/A 04/06/2021   Procedure: APPENDECTOMY LAPAROSCOPIC;  Surgeon: Ebbie Cough, MD;  Location: [facility] OR;  Service: General;  Laterality: N/A;   PERITONEAL CATHETER INSERTION  2011    Prior to Admission medications   Medication Sig Start Date End Date Taking? Authorizing Provider  allopurinol  (ZYLOPRIM ) 300 MG tablet TAKE 1 TABLET BY MOUTH DAILY TO PREVENT GOUT 08/05/23  Yes Fargo, Amy E, NP  atorvastatin  (LIPITOR) 40 MG tablet Take 1 tab daily for cholesterol. Patient taking differently: Take 40 mg by mouth in the morning. 06/17/23  Yes Webb, Padonda B, FNP  brimonidine  (ALPHAGAN ) 0.2 % ophthalmic solution Place 1 drop into both eyes 2 (two) times daily. 03/27/22  Yes [provider]  buPROPion  (WELLBUTRIN  XL) 300 MG 24 hr tablet Take  1 tablet  Daily for Mood, Focus & Concentration                                         /  TAKE                      BY                      MOUTH Patient taking differently: Take 300 mg by mouth in the morning. 12/24/22  Yes Tonita Fallow, MD  Cyanocobalamin (VITAMIN B12 PO) Take 1 tablet by mouth daily.   Yes [provider]  finasteride  (PROSCAR ) 5 MG tablet TAKE 1 TABLET BY MOUTH DAILY FOR PROSTATE 03/31/23  Yes Wilkinson, Dana E, NP  furosemide  (LASIX ) 40 MG tablet TAKE ONE AND ONE-HALF (1.5) TABLETS BY MOUTH DAILY 11/06/23  Yes [place], Peter M, MD  gabapentin  (NEURONTIN ) 300 MG capsule Take  1 capsule  3 x /day for  Pain                                                                                               /                                                                   TAKE                                         BY                                                 MOUTH Patient taking differently: Take 300 mg by mouth at bedtime. 10/11/22  Yes Tonita Fallow, MD  glucose blood (FREESTYLE TEST STRIPS) test strip Check blood sugar 3 to 4 times daily for medication regulation. 12/04/15  Yes Tonita Fallow, MD  latanoprost  (XALATAN ) 0.005 % ophthalmic solution Place 1 drop into both eyes at bedtime.  04/11/16  Yes [provider]  Magnesium  500 MG TABS Take 1 tablet (500 mg total) by mouth daily. 07/05/21  Yes Jeanine Knee, NP  modafinil  (PROVIGIL ) 200 MG tablet Take 1 tablet (200 mg total) by mouth daily. 06/23/23  Yes Fargo, Amy E, NP  mycophenolate  (MYFORTIC ) 360 MG TBEC EC tablet TAKE 1 TABLET BY MOUTH TWICE DAILY 05/06/16  Yes Tonita Fallow, MD  NOVOLOG  FLEXPEN 100 UNIT/ML FlexPen Inject 2-3 Units into the skin See admin instructions. Inject 2-3 units into the skin three times a day before meals, per sliding scale   Yes [provider]  tacrolimus  (PROGRAF ) 0.5 MG capsule Take 0.5 mg by mouth every 12 (twelve) hours.   Yes [provider]  tacrolimus  (PROGRAF ) 1 MG capsule Take 1 mg by mouth every 12 (twelve) hours.   Yes [provider]  tamsulosin  (FLOMAX ) 0.4 MG  CAPS capsule Take 1 capsule (0.4 mg total) by mouth daily. Patient taking differently: Take 0.4 mg by mouth at bedtime. 06/23/23  Yes Fargo, Amy E, NP  traZODone  (DESYREL ) 50 MG tablet TAKE ONE TABLET BY MOUTH DAILY ONE HOUR BEFORE BEDTIME AS NEEDED FOR SLEEP Patient taking differently: Take 50 mg by mouth at bedtime. 07/15/23  Yes Fargo, Amy E, NP  valsartan  (DIOVAN ) 80 MG tablet TAKE 1 TABLET BY MOUTH DAILY 08/04/23  Yes Nahser, Aleene PARAS, MD  warfarin (COUMADIN ) 5 MG tablet Takes 5mg  by mouth daily Monday- Saturday, reduce dose to 2.5 mg every sunday Patient taking differently: Take 5 mg  by mouth in the morning. 06/08/23  Yes Fargo, Amy E, NP  pantoprazole  (PROTONIX ) 40 MG tablet Take 40 mg by mouth daily before breakfast.    [provider]    Current Facility-Administered Medications  Medication Dose Route Frequency Provider Last Rate Last Admin   0.9 %  sodium chloride  infusion   Intravenous Continuous Jusiah Aguayo, Elspeth SQUIBB, MD 20 mL/hr at 11/20/23 0703 New Bag at 11/20/23 0703    Allergies as of 10/21/2023 - Review Complete 10/21/2023  Allergen Reaction Noted   Losartan Anaphylaxis 04/16/2012   Tape Other (See Comments) 09/11/2023   Morphine  Other (See Comments) 09/11/2023   Crestor [rosuvastatin] Other (See Comments) 03/06/2013   Lorazepam Other (See Comments) 02/25/2014    Family History  Problem Relation Age of Onset   Diabetes Mother    COPD Mother    Heart attack Father        Unsure    COPD Father    Kidney disease Sister    Alcoholism Brother    Alzheimer's disease Brother    Alcohol abuse Brother    COPD Brother    Alcohol abuse Maternal Grandfather    Colon cancer Neg Hx    Stomach cancer Neg Hx    Rectal cancer Neg Hx    Esophageal cancer Neg Hx    Liver cancer Neg Hx    Pancreatic cancer Neg Hx     Social History   Socioeconomic History   Marital status: Married    Spouse name: Not on file   Number of children: 1   Years of education: Not on file   Highest education level: Some college, no degree  Occupational History   Occupation: retired  Tobacco Use   Smoking status: Former    Current packs/day: 0.00    Average packs/day: 2.0 packs/day for 24.6 years (49.3 ttl pk-yrs)    Types: Cigarettes    Start date: 1968    Quit date: 11/29/1990    Years since quitting: 32.9   Smokeless tobacco: Never  Vaping Use   Vaping status: Never Used  Substance and Sexual Activity   Alcohol use: Yes    Comment: rarely   Drug use: No   Sexual activity: Not on file  Other Topics Concern   Not on file  Social History Narrative    Tobacco use, amount per day now: None   Past tobacco use, amount per day: 2 packs of cigarettes   How many years did you use tobacco: 20 years    Alcohol use (drinks per week): None   Diet: None   Do you drink/eat things with caffeine: Yes coffee   Marital status:  Married  What year were you married? 1976   Do you live in a house, apartment, assisted living, condo, trailer, etc.? House   Is it one or more stories? 1 story    How many persons live in your home? 2   Do you have pets in your home?( please list) 1 cat   Highest Level of education completed? 2 years of college   Current or past profession: Sales    Do you exercise?  No                                Type and how often? No   Do you have a living will? Yes   Do you have a DNR form? Yes                                  If not, do you want to discuss one?   Do you have signed POA/HPOA forms? Yes                        If so, please bring to you appointment      Do you have any difficulty bathing or dressing yourself? No   Do you have any difficulty preparing food or eating? No   Do you have any difficulty managing your medications? No   Do you have any difficulty managing your finances? No   Do you have any difficulty affording your medications? No   Social Drivers of Corporate investment banker Strain: Low Risk  (06/25/2023)   Overall Financial Resource Strain (CARDIA)    Difficulty of Paying Living Expenses: Not hard at all  Food Insecurity: No Food Insecurity (09/11/2023)   Hunger Vital Sign    Worried About Running Out of Food in the Last Year: Never true    Ran Out of Food in the Last Year: Never true  Transportation Needs: No Transportation Needs (09/11/2023)   PRAPARE - Administrator, Civil Service (Medical): No    Lack of Transportation (Non-Medical): No  Physical Activity: Unknown (06/25/2023)   Exercise Vital Sign    Days of Exercise per Week: 0 days    Minutes of Exercise  per Session: Not on file  Stress: Stress Concern Present (06/25/2023)   [employer] of Occupational Health - Occupational Stress Questionnaire    Feeling of Stress : To some extent  Social Connections: Moderately Integrated (09/11/2023)   Social Connection and Isolation Panel    Frequency of Communication with Friends and Family: Once a week    Frequency of Social Gatherings with Friends and Family: Never    Attends Religious Services: More than 4 times per year    Active Member of [employer] or Organizations: Yes    Attends Engineer, structural: More than 4 times per year    Marital Status: Married  Catering manager Violence: Not At Risk (09/11/2023)   Humiliation, Afraid, Rape, and Kick questionnaire    Fear of Current or Ex-Partner: No    Emotionally Abused: No    Physically Abused: No    Sexually Abused: No    Review of Systems: All other review of systems negative except as mentioned in the HPI.  Physical Exam: Vital signs BP (!) 154/69   Pulse 66   Temp (!) 97.3 F (36.3 C) (Temporal)  Resp 14   Ht 5' 8 (1.727 m)   Wt 75.8 kg   SpO2 97%   BMI 25.39 kg/m   General:   Alert,  Well-developed, pleasant and cooperative in NAD Lungs:  Clear throughout to auscultation.   Heart:  Regular rate and rhythm Abdomen:  Soft, nontender and nondistended.   Neuro/Psych:  Alert and cooperative. Normal mood and affect. A and O x 3  Robert Naval, MD [facility]

## 2023-11-20 NOTE — Discharge Instructions (Signed)

## 2023-11-20 NOTE — Progress Notes (Signed)
 Crna aware and treated bp

## 2023-11-20 NOTE — Anesthesia Preprocedure Evaluation (Addendum)
 Anesthesia Evaluation  Patient identified by MRN, date of birth, ID band Patient awake    Reviewed: Allergy & Precautions, NPO status , Patient's Chart, lab work & pertinent test results  Airway Mallampati: II  TM Distance: >3 FB Neck ROM: Full    Dental no notable dental hx.    Pulmonary sleep apnea , former smoker   Pulmonary exam normal        Cardiovascular hypertension, Pt. on medications + CAD, + Past MI, + CABG and +CHF  Normal cardiovascular exam+ dysrhythmias Atrial Fibrillation + Valvular Problems/Murmurs   ECHO: 1. Global hypokinesis worse in the posterior lateral wall and septum.  Left ventricular ejection fraction, by estimation, is 35 to 40%. The left  ventricle has moderately decreased function. The left ventricle  demonstrates global hypokinesis. The left  ventricular internal cavity size was moderately dilated. There is moderate  left ventricular hypertrophy. Left ventricular diastolic parameters were  normal.     Neuro/Psych  PSYCHIATRIC DISORDERS  Depression    negative neurological ROS     GI/Hepatic negative GI ROS,,,(+) Cirrhosis         Endo/Other  diabetes, Insulin  Dependent    Renal/GU Renal disease     Musculoskeletal negative musculoskeletal ROS (+)    Abdominal   Peds  Hematology  (+) Blood dyscrasia (Coumadin )   Anesthesia Other Findings Hepatic cirrhosis  Reproductive/Obstetrics                              Anesthesia Physical Anesthesia Plan  ASA: 3  Anesthesia Plan: MAC   Post-op Pain Management:    Induction:   PONV Risk Score and Plan: 1 and Propofol  infusion and Treatment may vary due to age or medical condition  Airway Management Planned: Nasal Cannula  Additional Equipment:   Intra-op Plan:   Post-operative Plan:   Informed Consent: I have reviewed the patients History and Physical, chart, labs and discussed the procedure  including the risks, benefits and alternatives for the proposed anesthesia with the patient or authorized representative who has indicated his/her understanding and acceptance.     Dental advisory given  Plan Discussed with: CRNA  Anesthesia Plan Comments:          Anesthesia Quick Evaluation

## 2023-11-20 NOTE — Anesthesia Postprocedure Evaluation (Signed)
 Anesthesia Post Note  Patient: Ezreal E Turan Jr.  Procedure(s) Performed: EGD (ESOPHAGOGASTRODUODENOSCOPY)     Patient location during evaluation: Endoscopy Anesthesia Type: MAC Level of consciousness: awake Pain management: pain level controlled Vital Signs Assessment: post-procedure vital signs reviewed and stable Respiratory status: spontaneous breathing, nonlabored ventilation and respiratory function stable Cardiovascular status: blood pressure returned to baseline and stable Postop Assessment: no apparent nausea or vomiting Anesthetic complications: no   No notable events documented.  Last Vitals:  Vitals:   11/20/23 0802 11/20/23 0803  BP: 119/72   Pulse: (!) 56 (!) 55  Resp: 17 14  Temp:    SpO2: 95% 98%    Last Pain:  Vitals:   11/20/23 0802  TempSrc:   PainSc: 0-No pain                 Pharoah Goggins P Gurfateh Mcclain

## 2023-11-20 NOTE — Op Note (Signed)
 [facility] Patient Name: Robert Arnold Procedure Date: 11/20/2023 MRN: [id number] Attending MD: Elspeth SQUIBB. Leigh , MD, [id number] Date of Birth: March 11, 1946 CSN: [id number] Age: 78 Admit Type: Outpatient Procedure:                Upper GI endoscopy Indications:              Cirrhosis - on coumadin  - screening for esophageal                            varices Providers:                Elspeth P. Leigh, MD, Willy Hummer, RN Referring MD:              Medicines:                Monitored Anesthesia Care Complications:            No immediate complications. Estimated blood loss:                            None. Estimated Blood Loss:     Estimated blood loss: none. Procedure:                Pre-Anesthesia Assessment:                           - Prior to the procedure, a History and Physical                            was performed, and patient medications and                            allergies were reviewed. The patient's tolerance of                            previous anesthesia was also reviewed. The risks                            and benefits of the procedure and the sedation                            options and risks were discussed with the patient.                            All questions were answered, and informed consent                            was obtained. Prior Anticoagulants: The patient has                            taken Coumadin  (warfarin), last dose was 1 day                            prior to procedure. ASA Grade Assessment: III - A  patient with severe systemic disease. After                            reviewing the risks and benefits, the patient was                            deemed in satisfactory condition to undergo the                            procedure.                           After obtaining informed consent, the endoscope was                            passed under direct vision. Throughout the                             procedure, the patient's blood pressure, pulse, and                            oxygen saturations were monitored continuously. The                            [id number]-[id number] ([id number]) Olympus endoscope was introduced                            through the mouth, and advanced to the second part                            of duodenum. The upper GI endoscopy was                            accomplished without difficulty. The patient                            tolerated the procedure well. Scope In: Scope Out: Findings:      Esophagogastric landmarks were identified: the Z-line was found at 39       cm, the gastroesophageal junction was found at 39 cm and the upper       extent of the gastric folds was found at 39 cm from the incisors.      One ? column of a trace varix was found in the middle third of the       esophagus. It easily flattened with insufflation and there is no high       risk stigmata for bleeding.      The exam of the esophagus was otherwise normal.      The entire examined stomach was normal.      The examined duodenum was normal. Impression:               - Esophagogastric landmarks identified.                           - One ? column of trace varix - no concerning  varices overall, risk for bleeding from this is                            quite low.                           - Normal stomach.                           - Normal examined duodenum.                           Overall, no high risk pathology to put him at risk                            for bleeding in the setting of Coumadin . One                            possible trace varix, no stigmata for bleeding,                            safe to continue Coumadin  at this time from                            bleeding perspective. Moderate Sedation:      No moderate sedation, case performed with MAC Recommendation:           - Patient has a contact number available for                             emergencies. The signs and symptoms of potential                            delayed complications were discussed with the                            patient. Return to normal activities tomorrow.                            Written discharge instructions were provided to the                            patient.                           - Resume previous diet.                           - Continue present medications.                           - Continue coumadin                            - Repeat upper endoscopy in 1 to 2 years to  re-evaluate for interval changes Procedure Code(s):        --- Professional ---                           [id number], Esophagogastroduodenoscopy, flexible,                            transoral; diagnostic, including collection of                            specimen(s) by brushing or washing, when performed                            (separate procedure) Diagnosis Code(s):        --- Professional ---                           K74.60, Unspecified cirrhosis of liver                           I85.10, 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 All rights reserved. The codes documented in this report are preliminary and upon coder review may  be revised to meet current compliance requirements. Elspeth P. Shiann Kam, MD 11/20/2023 7:45:55 AM This report has been signed electronically. Number of Addenda: 0

## 2023-11-21 MED ORDER — FUROSEMIDE 20 MG PO TABS: ORAL_TABLET | ORAL | 0 refills | Status: DC

## 2023-11-21 NOTE — Telephone Encounter (Signed)
 The patient has been notified of the result and verbalized understanding.  All questions (if any) were answered.  Pt aware that per Reche Finder NP, we can adjust his lasix  to taking 40 mg and 60 mg every other day with repeat BMET in 1-2 weeks.  Pt aware we will call in the 20 mg tablets of lasix  with instructions as so written on the prescription:  Take 40 mg po daily on Mon, Wed, Fri, and Sunday, and take 60 mg po daily on Tue, Thursday, and Saturday.    Pt aware if sob worsens/persist, then he should let us  know so that we can get sooner follow-up with Damien Braver NP or his Primary Cards.   Confirmed the pharmacy of choice with the pt.   Pt verbalized understanding and agrees with this plan.

## 2023-11-21 NOTE — Telephone Encounter (Signed)
 He can instead adjust to 40mg  and 60mg  every other day with labs as previously advised. If noting more SOB, would recommend sooner follow up with Damien Braver, NP (who he saw last) or his primary cardiologist.   Reche GORMAN Finder, NP

## 2023-11-24 ENCOUNTER — Encounter (HOSPITAL_COMMUNITY): Payer: Self-pay | Admitting: Gastroenterology

## 2023-12-01 ENCOUNTER — Ambulatory Visit (INDEPENDENT_AMBULATORY_CARE_PROVIDER_SITE_OTHER)

## 2023-12-01 DIAGNOSIS — Z23 Encounter for immunization: Secondary | ICD-10-CM | POA: Diagnosis not present

## 2023-12-01 DIAGNOSIS — K7460 Unspecified cirrhosis of liver: Secondary | ICD-10-CM

## 2023-12-02 ENCOUNTER — Ambulatory Visit: Payer: Medicare Other | Admitting: Nurse Practitioner

## 2023-12-04 ENCOUNTER — Ambulatory Visit: Payer: Medicare Other | Admitting: Nurse Practitioner

## 2023-12-26 ENCOUNTER — Other Ambulatory Visit: Payer: Self-pay | Admitting: Orthopedic Surgery

## 2023-12-26 DIAGNOSIS — G47419 Narcolepsy without cataplexy: Secondary | ICD-10-CM

## 2023-12-28 ENCOUNTER — Other Ambulatory Visit: Payer: Self-pay | Admitting: Orthopedic Surgery

## 2023-12-28 DIAGNOSIS — G47419 Narcolepsy without cataplexy: Secondary | ICD-10-CM

## 2023-12-29 LAB — LAB REPORT - SCANNED: EGFR: 49

## 2024-01-05 ENCOUNTER — Telehealth: Payer: Self-pay | Admitting: Cardiovascular Disease

## 2024-01-05 NOTE — Telephone Encounter (Signed)
 Caller Wyman) stated Dr. Chrystal want patient to be seen sooner due to episode dyspnea and heart history.  Caller noted they will be faxing notes from patient's visit with Dr. Chrystal.

## 2024-01-05 NOTE — Telephone Encounter (Signed)
 Called and spoke to patient. Schedule for an appointment 01/09/24 at 2:45 pm with Josefa Beauvais NP.   Patient is aware will send message to Dr MALVATHEORA Mulch if wants to see rather than APP.  Next available would be 01/21/24.  Patient verbalized understanding

## 2024-01-06 ENCOUNTER — Ambulatory Visit: Payer: Medicare Other | Admitting: Nurse Practitioner

## 2024-01-07 NOTE — Progress Notes (Unsigned)
 Cardiology Clinic Note   Patient Name: Robert Arnold. Date of Encounter: 01/09/2024  Primary Care Provider:  Chrystal Lamarr RAMAN, MD Primary Cardiologist:  Aleene Passe, MD (Inactive)  Patient Profile    Robert Arnold. 78 year old male presents to the clinic today for evaluation of his increased shortness of breath/DOE.  Past Medical History    Past Medical History:  Diagnosis Date   Adenomatous colon polyp    Allergy    BPH (benign prostatic hyperplasia)    CAD (coronary artery disease)    a. NSTEMI 09/2011 s/p PCI to LAD. b. CABG 01/2012 @ [facility].   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   Trifascicular block    Past Surgical History:  Procedure Laterality Date   ANTERIOR CERVICAL DECOMP/DISCECTOMY FUSION N/A 05/04/2018   Procedure: Cervical Five-Six Anterior cervical decompression/discectomy/fusion;  Surgeon: Alix Charleston, MD;  Location: [facility] OR;  Service: Neurosurgery;  Laterality: N/A;  Cervical Five-Six Anterior cervical decompression/discectomy/fusion   APPENDECTOMY     AV FISTULA PLACEMENT  2011   Left forearm   COLONOSCOPY     CORONARY ANGIOPLASTY WITH STENT PLACEMENT     CORONARY ARTERY BYPASS GRAFT  2013   ESOPHAGOGASTRODUODENOSCOPY N/A 11/20/2023   Procedure: EGD (ESOPHAGOGASTRODUODENOSCOPY);  Surgeon: Leigh Elspeth SQUIBB, MD;  Location: [facility];  Service: Gastroenterology;   Laterality: N/A;   KIDNEY TRANSPLANT  09/25/2011   LAPAROSCOPIC APPENDECTOMY N/A 04/06/2021   Procedure: APPENDECTOMY LAPAROSCOPIC;  Surgeon: Ebbie Cough, MD;  Location: [facility] OR;  Service: General;  Laterality: N/A;   PERITONEAL CATHETER INSERTION  2011    Allergies  Allergies  Allergen Reactions   Losartan Anaphylaxis   Tape Other (See Comments)    SKIN IS VERY DELICATE AND WILL TEAR AND BRUISE EASILY!!!   Morphine  Other (See Comments)    This is INEFFECTIVE   Crestor [Rosuvastatin] Other (See Comments)    Elevated LFT's   Lorazepam Other (See Comments)    Pt is unsure of reaction     History of Present Illness    Robert Arnold. has a PMH of combined systolic and diastolic CHF, paroxysmal atrial fibrillation, coronary artery disease, essential hypertension, hyperlipidemia, AV fistula, first-degree AV block, and hepatic cirrhosis.  His PMH also includes CKD stage II, depression and gout.  He underwent cardiac catheterization with PCI and DES to his LAD 6/13.  He had CABG times 07/27/2011 which was performed at [facility]  He has also status post renal transplant in 2013.  Postoperatively he developed atrial fibrillation.  He was started on Coumadin .  His echocardiogram 2013 showed an LVEF of 45-50%, moderate aortic insufficiency, mild aortic stenosis, mild dilation of ascending aorta measuring 41 mm.  His stress testing 3/23 showed no evidence of infarction or ischemia.  He was hospitalized 09/11/2023 through 09/12/2023 in the setting  of acute on chronic systolic CHF.  His BNP was noted to be elevated.  His cardiac troponins were elevated but flat.  His echocardiogram at that time showed an LVEF of 35-40%, moderately decreased LV function, moderate LVH, normal RV systolic function and no significant valvular abnormalities.  He received IV diuresis.  He returned to the emergency department on 09/22/2023.  He was concern for increased dyspnea, orthopnea and received increased Lasix  of 40 mg  daily.  He was seen in the office 09/25/2023.  He reported more stable dyspnea and overall improvement.  He noted intermittent episodes of lightheadedness.  His Lasix  was increased to 60 mg daily.  He was seen in follow-up by [facility] NP on 10/27/2023.  During that time he presented with his wife.  He remained stable from a cardiac standpoint.  He did note occasional episodes of lightheadedness with position changes.  He denied syncope, presyncope and palpitations.  He reported stable dyspnea on exertion.  He did note mild gait instability.  He reported a recent fall at that time.  He denied head injury.  He was following with GI for his cirrhosis and was pending MRI and endoscopy.  He was seen by his PCP.  He noted dyspnea.  Sooner follow-up was recommended for evaluation of his dyspnea and due to his heart history.  He presents to the clinic today for evaluation and states he has been noticing shortness of breath decree stamina and intermittent dizziness for the past couple months.  He followed up with his PCP.  He had lab work drawn at that time.  Echocardiogram was recommended.  He had echocardiogram yesterday which showed stable LV function and increased aortic stenosis.  He reports that he feels some better with 60 mg of Lasix  daily.  We reviewed his recent echocardiogram.  He and his wife expressed understanding.  I will request labs from his PCP and refer to structural heart for consideration of TAVR procedure.  Will plan follow-up in 4 to 6 months.  Today he denies chest pain, lower extremity edema, fatigue, palpitations, melena, hematuria, and hemoptysis.    Home Medications    Prior to Admission medications   Medication Sig Start Date End Date Taking? Authorizing Provider  allopurinol  (ZYLOPRIM ) 300 MG tablet TAKE 1 TABLET BY MOUTH DAILY TO PREVENT GOUT 08/05/23   Fargo, Amy E, NP  atorvastatin  (LIPITOR) 40 MG tablet Take 1 tab daily for cholesterol. Patient taking differently: Take 40 mg by  mouth in the morning. 06/17/23   Webb, Padonda B, FNP  brimonidine  (ALPHAGAN ) 0.2 % ophthalmic solution Place 1 drop into both eyes 2 (two) times daily. 03/27/22   [provider]  buPROPion  (WELLBUTRIN  XL) 300 MG 24 hr tablet Take  1 tablet  Daily for Mood, Focus & Concentration                                         /         TAKE                      BY                      MOUTH Patient taking differently: Take 300 mg by mouth in the morning. 12/24/22   Tonita Fallow, MD  Cyanocobalamin (VITAMIN B12 PO) Take 1 tablet by mouth daily.  [provider]  finasteride  (PROSCAR ) 5 MG tablet TAKE 1 TABLET BY MOUTH DAILY FOR PROSTATE 03/31/23   Wilkinson, Dana E, NP  furosemide  (LASIX ) 20 MG tablet Take 2 tablets (40 mg total) by mouth daily on Mon, Wed, Fri, Sunday, and take 3 tablets (60 mg total) by mouth daily on Tue, Thurs, and Saturday. 11/21/23   Vannie Reche RAMAN, NP  gabapentin  (NEURONTIN ) 300 MG capsule Take  1 capsule  3 x /day for  Pain                                                                                              /                                                                   TAKE                                         BY                                                 MOUTH Patient taking differently: Take 300 mg by mouth at bedtime. 10/11/22   Tonita Fallow, MD  glucose blood (FREESTYLE TEST STRIPS) test strip Check blood sugar 3 to 4 times daily for medication regulation. 12/04/15   Tonita Fallow, MD  latanoprost  (XALATAN ) 0.005 % ophthalmic solution Place 1 drop into both eyes at bedtime.  04/11/16   [provider]  Magnesium  500 MG TABS Take 1 tablet (500 mg total) by mouth daily. 07/05/21   Jeanine Knee, NP  modafinil  (PROVIGIL ) 200 MG tablet Take 1 tablet (200 mg total) by mouth daily. 06/23/23   Fargo, Amy E, NP  mycophenolate  (MYFORTIC ) 360 MG TBEC EC tablet TAKE 1 TABLET BY MOUTH TWICE DAILY 05/06/16   Tonita Fallow, MD  NOVOLOG   FLEXPEN 100 UNIT/ML FlexPen Inject 2-3 Units into the skin See admin instructions. Inject 2-3 units into the skin three times a day before meals, per sliding scale    [provider]  pantoprazole  (PROTONIX ) 40 MG tablet Take 40 mg by mouth daily before breakfast.    [provider]  tacrolimus  (PROGRAF ) 0.5 MG capsule Take 0.5 mg by mouth every 12 (twelve) hours.    [provider]  tacrolimus  (PROGRAF ) 1 MG capsule Take 1 mg by mouth every 12 (twelve) hours.    [provider]  tamsulosin  (FLOMAX ) 0.4 MG CAPS capsule Take 1 capsule (0.4 mg total) by mouth daily. Patient taking differently: Take 0.4 mg by mouth at bedtime. 06/23/23   Fargo, Amy E, NP  traZODone  (DESYREL ) 50 MG tablet TAKE ONE  TABLET BY MOUTH DAILY ONE HOUR BEFORE BEDTIME AS NEEDED FOR SLEEP Patient taking differently: Take 50 mg by mouth at bedtime. 07/15/23   Gil Greig BRAVO, NP  valsartan  (DIOVAN ) 80 MG tablet TAKE 1 TABLET BY MOUTH DAILY 08/04/23   Nahser, Aleene PARAS, MD  warfarin (COUMADIN ) 5 MG tablet Takes 5mg  by mouth daily Monday- Saturday, reduce dose to 2.5 mg every sunday Patient taking differently: Take 5 mg by mouth in the morning. 06/08/23   Gil Greig BRAVO, NP    Family History    Family History  Problem Relation Age of Onset   Diabetes Mother    COPD Mother    Heart attack Father        Unsure    COPD Father    Kidney disease Sister    Alcoholism Brother    Alzheimer's disease Brother    Alcohol abuse Brother    COPD Brother    Alcohol abuse Maternal Grandfather    Colon cancer Neg Hx    Stomach cancer Neg Hx    Rectal cancer Neg Hx    Esophageal cancer Neg Hx    Liver cancer Neg Hx    Pancreatic cancer Neg Hx    He indicated that his mother is deceased. He indicated that his father is deceased. He indicated that both of his sisters are alive. He indicated that only one of his three brothers is alive. He indicated that his maternal grandmother is deceased. He indicated that  his maternal grandfather is deceased. He indicated that his paternal grandmother is deceased. He indicated that his paternal grandfather is deceased. He indicated that the status of his neg hx is unknown.  Social History    Social History   Socioeconomic History   Marital status: Married    Spouse name: Not on file   Number of children: 1   Years of education: Not on file   Highest education level: Some college, no degree  Occupational History   Occupation: retired  Tobacco Use   Smoking status: Former    Current packs/day: 0.00    Average packs/day: 2.0 packs/day for 24.6 years (49.3 ttl pk-yrs)    Types: Cigarettes    Start date: 1968    Quit date: 11/29/1990    Years since quitting: 33.1   Smokeless tobacco: Never  Vaping Use   Vaping status: Never Used  Substance and Sexual Activity   Alcohol use: Yes    Comment: rarely   Drug use: No   Sexual activity: Not on file  Other Topics Concern   Not on file  Social History Narrative   Tobacco use, amount per day now: None   Past tobacco use, amount per day: 2 packs of cigarettes   How many years did you use tobacco: 20 years    Alcohol use (drinks per week): None   Diet: None   Do you drink/eat things with caffeine: Yes coffee   Marital status:  Married                                What year were you married? 1976   Do you live in a house, apartment, assisted living, condo, trailer, etc.? House   Is it one or more stories? 1 story    How many persons live in your home? 2   Do you have pets in your home?( please list) 1 cat   Highest Level of  education completed? 2 years of college   Current or past profession: Sales    Do you exercise?  No                                Type and how often? No   Do you have a living will? Yes   Do you have a DNR form? Yes                                  If not, do you want to discuss one?   Do you have signed POA/HPOA forms? Yes                        If so, please bring to you  appointment      Do you have any difficulty bathing or dressing yourself? No   Do you have any difficulty preparing food or eating? No   Do you have any difficulty managing your medications? No   Do you have any difficulty managing your finances? No   Do you have any difficulty affording your medications? No   Social Drivers of Corporate investment banker Strain: Low Risk  (06/25/2023)   Overall Financial Resource Strain (CARDIA)    Difficulty of Paying Living Expenses: Not hard at all  Food Insecurity: No Food Insecurity (09/11/2023)   Hunger Vital Sign    Worried About Running Out of Food in the Last Year: Never true    Ran Out of Food in the Last Year: Never true  Transportation Needs: No Transportation Needs (09/11/2023)   PRAPARE - Administrator, Civil Service (Medical): No    Lack of Transportation (Non-Medical): No  Physical Activity: Unknown (06/25/2023)   Exercise Vital Sign    Days of Exercise per Week: 0 days    Minutes of Exercise per Session: Not on file  Stress: Stress Concern Present (06/25/2023)   [employer] of Occupational Health - Occupational Stress Questionnaire    Feeling of Stress : To some extent  Social Connections: Moderately Integrated (09/11/2023)   Social Connection and Isolation Panel    Frequency of Communication with Friends and Family: Once a week    Frequency of Social Gatherings with Friends and Family: Never    Attends Religious Services: More than 4 times per year    Active Member of [employer] or Organizations: Yes    Attends Engineer, structural: More than 4 times per year    Marital Status: Married  Catering manager Violence: Not At Risk (09/11/2023)   Humiliation, Afraid, Rape, and Kick questionnaire    Fear of Current or Ex-Partner: No    Emotionally Abused: No    Physically Abused: No    Sexually Abused: No     Review of Systems    General:  No chills, fever, night sweats or weight changes.  Cardiovascular:  No chest  pain, dyspnea on exertion, edema, orthopnea, palpitations, paroxysmal nocturnal dyspnea. Dermatological: No rash, lesions/masses Respiratory: No cough, dyspnea Urologic: No hematuria, dysuria Abdominal:   No nausea, vomiting, diarrhea, bright red blood per rectum, melena, or hematemesis Neurologic:  No visual changes, wkns, changes in mental status. All other systems reviewed and are otherwise negative except as noted above.  Physical Exam    VS:  BP 118/62   Pulse (!) 54  Ht 5' 8 (1.727 m)   Wt 170 lb (77.1 kg)   SpO2 96%   BMI 25.85 kg/m  , BMI Body mass index is 25.85 kg/m. GEN: Well nourished, well developed, in no acute distress. HEENT: normal. Neck: Supple, no JVD, carotid bruits, or masses. Cardiac: RRR, 3/6 systolic murmur heard along RSB, rubs, or gallops. No clubbing, cyanosis, edema.  Radials/DP/PT 2+ and equal bilaterally.  Respiratory:  Respirations regular and unlabored, clear to auscultation bilaterally. GI: Soft, nontender, nondistended, BS + x 4. MS: no deformity or atrophy. Skin: warm and dry, no rash. Neuro:  Strength and sensation are intact. Psych: Normal affect.  Accessory Clinical Findings    Recent Labs: 04/23/2023: Magnesium  2.2; TSH 2.05 09/11/2023: ALT 51 09/22/2023: Hemoglobin 13.9; Platelets 143 11/19/2023: BNP 208.9; BUN 46; Creatinine, Ser 1.42; Potassium CANCELED; Sodium CANCELED   Recent Lipid Panel    Component Value Date/Time   CHOL 130 04/23/2023 1034   CHOL 158 03/15/2013 0744   TRIG 48 04/23/2023 1034   HDL 64 04/23/2023 1034   HDL 61 03/15/2013 0744   CHOLHDL 2.0 04/23/2023 1034   VLDL 17 09/09/2016 1042   LDLCALC 52 04/23/2023 1034         ECG personally reviewed by me today-none today.     Echocardiogram 09/12/2023   IMPRESSIONS     1. Global hypokinesis worse in the posterior lateral wall and septum.  Left ventricular ejection fraction, by estimation, is 35 to 40%. The left  ventricle has moderately decreased  function. The left ventricle  demonstrates global hypokinesis. The left  ventricular internal cavity size was moderately dilated. There is moderate  left ventricular hypertrophy. Left ventricular diastolic parameters were  normal.   2. Right ventricular systolic function is normal. The right ventricular  size is normal.   3. Left atrial size was moderately dilated.   4. The mitral valve is abnormal. Trivial mitral valve regurgitation. No  evidence of mitral stenosis.   5. Calcified mean gradient 6 peak 9 mmHg but AVA 2.5 cm2. The aortic  valve is tricuspid. There is moderate calcification of the aortic valve.  There is moderate thickening of the aortic valve. Aortic valve  regurgitation is trivial. Aortic valve  sclerosis/calcification is present, without any evidence of aortic  stenosis.   6. The inferior vena cava is normal in size with greater than 50%  respiratory variability, suggesting right atrial pressure of 3 mmHg.   FINDINGS   Left Ventricle: Global hypokinesis worse in the posterior lateral wall  and septum. Left ventricular ejection fraction, by estimation, is 35 to  40%. The left ventricle has moderately decreased function. The left  ventricle demonstrates global hypokinesis.   Strain was performed and the global longitudinal strain is indeterminate.  The left ventricular internal cavity size was moderately dilated. There is  moderate left ventricular hypertrophy. Left ventricular diastolic  parameters were normal.   Right Ventricle: The right ventricular size is normal. No increase in  right ventricular wall thickness. Right ventricular systolic function is  normal.   Left Atrium: Left atrial size was moderately dilated.   Right Atrium: Right atrial size was normal in size.   Pericardium: There is no evidence of pericardial effusion.   Mitral Valve: The mitral valve is abnormal. There is mild thickening of  the mitral valve leaflet(s). There is mild calcification  of the mitral  valve leaflet(s). Mild mitral annular calcification. Trivial mitral valve  regurgitation. No evidence of mitral  valve stenosis.  Tricuspid Valve: The tricuspid valve is normal in structure. Tricuspid  valve regurgitation is trivial. No evidence of tricuspid stenosis.   Aortic Valve: Calcified mean gradient 6 peak 9 mmHg but AVA 2.5 cm2. The  aortic valve is tricuspid. There is moderate calcification of the aortic  valve. There is moderate thickening of the aortic valve. Aortic valve  regurgitation is trivial. Aortic valve   sclerosis/calcification is present, without any evidence of aortic  stenosis. Aortic valve mean gradient measures 9.0 mmHg. Aortic valve peak  gradient measures 15.9 mmHg. Aortic valve area, by VTI measures 2.62 cm.   Pulmonic Valve: The pulmonic valve was normal in structure. Pulmonic valve  regurgitation is not visualized. No evidence of pulmonic stenosis.   Aorta: The aortic root is normal in size and structure.   Venous: The inferior vena cava is normal in size with greater than 50%  respiratory variability, suggesting right atrial pressure of 3 mmHg.   IAS/Shunts: No atrial level shunt detected by color flow Doppler.   Additional Comments: 3D was performed not requiring image post processing  on an independent workstation and was indeterminate.   Echocardiogram on 01/08/2024  IMPRESSIONS     1. Hypokinesis of the mid/distal lateral, mid/distal anterior, distal  inferior and apical walls . Left ventricular ejection fraction, by  estimation, is 35 to 40%. Left ventricular ejection fraction by 3D volume  is 45 %. The left ventricle has moderately   decreased function. There is mild left ventricular hypertrophy. Left  ventricular diastolic parameters are consistent with Grade I diastolic  dysfunction (impaired relaxation). Elevated left atrial pressure. The  average left ventricular global  longitudinal strain is -13.9 %. The global  longitudinal strain is  abnormal.   2. Right ventricular systolic function is normal. The right ventricular  size is normal.   3. Left atrial size was mild to moderately dilated.   4. Mild mitral valve regurgitation.   5. AV is thickened, calcified with restricted motion. Peak and mean  gradients through the AV are 29 and 17 mm Hg respectively AVA (VTI) is 1.2  cm2. Dimensionless index is 0.36 consistent with moderate AS. COmpared to  previous echo from June 2025, mean  gradient is increased. . The aortic valve is tricuspid. Aortic valve  regurgitation is mild.   FINDINGS   Left Ventricle: Hypokinesis of the mid/distal lateral, mid/distal  anterior, distal inferior and apical walls. Left ventricular ejection  fraction, by estimation, is 35 to 40%. Left ventricular ejection fraction  by 3D volume is 45 %. The left ventricle  has moderately decreased function. The average left ventricular global  longitudinal strain is -13.9 %. Strain was performed and the global  longitudinal strain is abnormal. The left ventricular internal cavity size  was normal in size. There is mild left  ventricular hypertrophy. Left ventricular diastolic parameters are  consistent with Grade I diastolic dysfunction (impaired relaxation).  Elevated left atrial pressure.   Right Ventricle: The right ventricular size is normal. Right vetricular  wall thickness was not assessed. Right ventricular systolic function is  normal.   Left Atrium: Left atrial size was mild to moderately dilated.   Right Atrium: Right atrial size was normal in size.   Pericardium: There is no evidence of pericardial effusion.   Mitral Valve: There is mild thickening of the mitral valve leaflet(s).  Mild mitral annular calcification. Mild mitral valve regurgitation.   Tricuspid Valve: The tricuspid valve is normal in structure. Tricuspid  valve regurgitation is mild.  Aortic Valve: AV is thickened, calcified with restricted  motion. Peak and  mean gradients through the AV are 29 and 17 mm Hg respectively AVA (VTI)  is 1.2 cm2. Dimensionless index is 0.36 consistent with moderate AS.  COmpared to previous echo from June  2025, mean gradient is increased. The aortic valve is tricuspid. Aortic  valve regurgitation is mild. Aortic regurgitation PHT measures 667 msec.  Aortic valve mean gradient measures 15.8 mmHg. Aortic valve peak gradient  measures 26.7 mmHg. Aortic valve  area, by VTI measures 1.24 cm.   Pulmonic Valve: The pulmonic valve was normal in structure. Pulmonic valve  regurgitation is trivial.   Aorta: The aortic root and ascending aorta are structurally normal, with  no evidence of dilitation.   IAS/Shunts: No atrial level shunt detected by color flow Doppler.     Assessment & Plan   1.  DOE-reports this is increased over the last 2 to 3 months.  Echocardiogram 10-25 showed stable LVEF and moderate AS. Heart healthy low-sodium diet Increase physical activity as tolerated Refer to structural heart  Coronary artery disease-no chest pain today.  He is status post CABG x 4 in 2013. Continue atorvastatin  Heart healthy low-sodium diet Maintain physical activity  Hyperlipidemia-LDL 52 on 04/23/2023. High-fiber diet Increase physical activity as tolerated Continue atorvastatin   Paroxysmal atrial fibrillation-heart rate today 54 bpm.  Reports compliance with warfarin.  Denies bleeding issues.  Denies episodes of increased or accelerated heart rate. Continue warfarin  Combined systolic and diastolic CHF, cardiac murmur-3/6 systolic murmur heard along right sternal border. Heart healthy low-sodium diet Increase physical activity as tolerated Continue valsartan , furosemide   Disposition: Follow-up with Dr. Kate or me in 4 months.   Josefa HERO. Johnay Mano NP-C     01/09/2024, 3:45 PM [facility] [address] [place], [place] [address] Office [phone]    Notice: This dictation was prepared with Dragon dictation along with smaller phrase technology. Any transcriptional errors that result from this process are unintentional and may not be corrected upon review.   I spent 14 minutes examining this patient, reviewing medications, and using patient centered shared decision making involving their cardiac care.   I spent  20 minutes reviewing past medical history,  medications, and prior cardiac tests.

## 2024-01-08 ENCOUNTER — Ambulatory Visit (HOSPITAL_COMMUNITY)
Admission: RE | Admit: 2024-01-08 | Discharge: 2024-01-08 | Disposition: A | Discharge status: Home / Self Care | Source: Ambulatory Visit | Attending: Cardiology | Admitting: Cardiology

## 2024-01-08 DIAGNOSIS — I5020 Unspecified systolic (congestive) heart failure: Secondary | ICD-10-CM | POA: Insufficient documentation

## 2024-01-08 DIAGNOSIS — R011 Cardiac murmur, unspecified: Secondary | ICD-10-CM | POA: Diagnosis not present

## 2024-01-08 LAB — ECHOCARDIOGRAM COMPLETE
AR max vel: 1.46 cm2
AV Area VTI: 1.24 cm2
AV Area mean vel: 1.28 cm2
AV Mean grad: 15.8 mmHg
AV Peak grad: 26.7 mmHg
Ao pk vel: 2.58 m/s
Area-P 1/2: 2.75 cm2
P 1/2 time: 667 ms
S' Lateral: 4.2 cm

## 2024-01-09 ENCOUNTER — Ambulatory Visit: Payer: Self-pay | Admitting: Nurse Practitioner

## 2024-01-09 ENCOUNTER — Ambulatory Visit: Attending: General Practice | Admitting: General Practice

## 2024-01-09 ENCOUNTER — Encounter: Payer: Self-pay | Admitting: General Practice

## 2024-01-09 VITALS — BP 118/62 | HR 54 | Ht 68.0 in | Wt 170.0 lb

## 2024-01-09 DIAGNOSIS — I2510 Atherosclerotic heart disease of native coronary artery without angina pectoris: Secondary | ICD-10-CM | POA: Insufficient documentation

## 2024-01-09 DIAGNOSIS — I5042 Chronic combined systolic (congestive) and diastolic (congestive) heart failure: Secondary | ICD-10-CM | POA: Diagnosis present

## 2024-01-09 DIAGNOSIS — R0609 Other forms of dyspnea: Secondary | ICD-10-CM | POA: Insufficient documentation

## 2024-01-09 DIAGNOSIS — I10 Essential (primary) hypertension: Secondary | ICD-10-CM | POA: Diagnosis not present

## 2024-01-09 DIAGNOSIS — Z79899 Other long term (current) drug therapy: Secondary | ICD-10-CM | POA: Insufficient documentation

## 2024-01-09 NOTE — Patient Instructions (Addendum)
 Medication Instructions:  Your physician recommends that you continue on your current medications as directed. Please refer to the Current Medication list given to you today. *If you need a refill on your cardiac medications before your next appointment, please call your pharmacy*  Lab Work: We will request a copy of your most recent lab work from your pcp If you have labs (blood work) drawn today and your tests are completely normal, you will receive your results only by: MyChart Message (if you have MyChart) OR A paper copy in the mail If you have any lab test that is abnormal or we need to change your treatment, we will call you to review the results.  Testing/Procedures: None ordered  Follow-Up: At [facility], you and your health needs are our priority.  As part of our continuing mission to provide you with exceptional heart care, our providers are all part of one team.  This team includes your primary Cardiologist (physician) and Advanced Practice Providers or APPs (Physician Assistants and Nurse Practitioners) who all work together to provide you with the care you need, when you need it.  Your next appointment:   4-6 month(s)  Provider:   Medford Nanas, MD or Josefa Beauvais, NP  We recommend signing up for the patient portal called MyChart.  Sign up information is provided on this After Visit Summary.  MyChart is used to connect with patients for Virtual Visits (Telemedicine).  Patients are able to view lab/test results, encounter notes, upcoming appointments, etc.  Non-urgent messages can be sent to your provider as well.   To learn more about what you can do with MyChart, go to [email or web address].   Other Instructions You have been referred to Structural Heart.

## 2024-01-13 ENCOUNTER — Ambulatory Visit: Attending: Cardiovascular Disease | Admitting: Cardiovascular Disease

## 2024-01-13 ENCOUNTER — Encounter: Payer: Self-pay | Admitting: Cardiovascular Disease

## 2024-01-13 VITALS — BP 114/60 | HR 60 | Ht 68.0 in | Wt 167.4 lb

## 2024-01-13 DIAGNOSIS — I480 Paroxysmal atrial fibrillation: Secondary | ICD-10-CM | POA: Insufficient documentation

## 2024-01-13 DIAGNOSIS — I10 Essential (primary) hypertension: Secondary | ICD-10-CM | POA: Insufficient documentation

## 2024-01-13 DIAGNOSIS — Z87891 Personal history of nicotine dependence: Secondary | ICD-10-CM

## 2024-01-13 DIAGNOSIS — I350 Nonrheumatic aortic (valve) stenosis: Secondary | ICD-10-CM | POA: Diagnosis not present

## 2024-01-13 DIAGNOSIS — I440 Atrioventricular block, first degree: Secondary | ICD-10-CM

## 2024-01-13 DIAGNOSIS — I25700 Atherosclerosis of coronary artery bypass graft(s), unspecified, with unstable angina pectoris: Secondary | ICD-10-CM

## 2024-01-13 NOTE — Progress Notes (Unsigned)
 Cardiology Office Note:    Date:  01/14/2024   ID:  Robert FORBES Pama Mickey., DOB Apr 14, 1945, MRN [id number]  PCP:  Chrystal Lamarr RAMAN, MD   [facility] Providers Cardiologist:  Aleene Passe, MD (Inactive)     Referring MD: Chrystal Lamarr RAMAN, *   Chief Complaint  Patient presents with   Aortic Stenosis    History of Present Illness:    Robert Pietrzak. is a 78 y.o. male presenting for evaluation of aortic stenosis.  The patient has a complex cardiovascular history with chronic combined systolic and diastolic heart failure, paroxysmal atrial fibrillation, coronary artery disease status post PCI and CABG, and renal transplantation.  He has had some functional limitation related to heart failure with exertional dyspnea and fatigue.  The patient denies any chest pain or pressure.  He occasionally has lightheadedness but has not had frank syncope or presyncope.  No heart palpitations.  The patient complains of gait unsteadiness.  The patient is referred for structural heart consultation after an echocardiogram 01/08/2024 showed moderate LV systolic dysfunction with LVEF 35 to 40% with segmental LV dysfunction.  RV function is normal.  Aortic valve is thickened and calcified with peak and mean gradients of 29 and 17 mmHg, respectively and aortic valve area calculated at 1.2 cm with a dimensionless index of 0.36.  The gradient is higher than the previous study just a few months earlier but remains in the moderate range.  Past Medical History:  Diagnosis Date   Adenomatous colon polyp    Allergy    BPH (benign prostatic hyperplasia)    CAD (coronary artery disease)    a. NSTEMI 09/2011 s/p PCI to LAD. b. CABG 01/2012 @ [facility].   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   Trifascicular block    Past Surgical History:  Procedure Laterality Date   ANTERIOR CERVICAL DECOMP/DISCECTOMY FUSION N/A 05/04/2018   Procedure: Cervical Five-Six Anterior cervical decompression/discectomy/fusion;  Surgeon: Alix Charleston, MD;  Location: [facility] OR;  Service: Neurosurgery;  Laterality: N/A;  Cervical Five-Six Anterior cervical decompression/discectomy/fusion   APPENDECTOMY     AV FISTULA PLACEMENT  2011   Left forearm   COLONOSCOPY     CORONARY ANGIOPLASTY WITH STENT PLACEMENT     CORONARY ARTERY BYPASS GRAFT  2013   ESOPHAGOGASTRODUODENOSCOPY N/A 11/20/2023   Procedure: EGD (ESOPHAGOGASTRODUODENOSCOPY);  Surgeon: Leigh Elspeth SQUIBB, MD;  Location: [facility];  Service: Gastroenterology;  Laterality: N/A;   KIDNEY TRANSPLANT  09/25/2011   LAPAROSCOPIC APPENDECTOMY N/A 04/06/2021   Procedure: APPENDECTOMY LAPAROSCOPIC;  Surgeon: Ebbie Cough, MD;  Location: [facility] OR;  Service: General;  Laterality: N/A;   PERITONEAL CATHETER INSERTION  2011    Current Medications: Current Meds  Medication Sig   allopurinol  (ZYLOPRIM ) 300 MG tablet TAKE 1 TABLET BY MOUTH DAILY TO PREVENT GOUT  aspirin  EC 81 MG tablet Take 81 mg by mouth daily.   atorvastatin  (LIPITOR) 40 MG tablet Take 1 tab daily for cholesterol.   brimonidine  (ALPHAGAN ) 0.2 % ophthalmic solution Place 1 drop into both eyes 2 (two) times daily.   buPROPion  (WELLBUTRIN  XL) 300 MG 24 hr tablet Take  1 tablet  Daily for Mood, Focus & Concentration                                         /         TAKE                      BY                      MOUTH    Cyanocobalamin (VITAMIN B12 PO) Take 1 tablet by mouth daily.   DROPLET PEN NEEDLES 32G X 4 MM MISC    finasteride  (PROSCAR ) 5 MG tablet TAKE 1 TABLET BY MOUTH DAILY FOR PROSTATE   furosemide  (LASIX ) 20 MG tablet Take 2 tablets (40 mg total) by mouth daily on Mon, Wed, Fri, Sunday, and take 3 tablets (60 mg total) by mouth daily on Tue, Thurs, and Saturday. (Patient taking differently: Take 20 mg by mouth daily. Per patient taking 60 mg daily.)   gabapentin  (NEURONTIN ) 300 MG capsule Take  1 capsule  3 x /day for  Pain                                                                                              /                                                                   TAKE                                         BY                                                 MOUTH   latanoprost  (XALATAN ) 0.005 % ophthalmic solution Place 1 drop into both eyes at bedtime.    Magnesium  500 MG TABS Take 1 tablet (500 mg total) by mouth daily.   modafinil  (PROVIGIL ) 200 MG tablet Take 1 tablet (200 mg total) by mouth daily. (Patient taking differently: Take 200 mg by mouth daily. Per patient taking 1/2 tablet 100 mg daily)   mycophenolate  (MYFORTIC ) 360 MG TBEC EC tablet TAKE 1 TABLET BY MOUTH TWICE DAILY   NOVOLOG   FLEXPEN 100 UNIT/ML FlexPen Inject 2-3 Units into the skin See admin instructions. Inject 2-3 units into the skin three times a day before meals, per sliding scale   tacrolimus  (PROGRAF ) 0.5 MG capsule Take 0.5 mg by mouth every 12 (twelve) hours.   tacrolimus  (PROGRAF ) 1 MG capsule Take 1 mg by mouth every 12 (twelve) hours.   tamsulosin  (FLOMAX ) 0.4 MG CAPS capsule Take 1 capsule (0.4 mg total) by mouth daily.   TOUJEO  SOLOSTAR 300 UNIT/ML Solostar Pen Inject 7 Units into the skin daily.   traZODone  (DESYREL ) 50 MG tablet TAKE ONE TABLET BY MOUTH DAILY ONE HOUR BEFORE BEDTIME AS NEEDED FOR SLEEP   valsartan  (DIOVAN ) 80 MG tablet TAKE 1 TABLET BY MOUTH DAILY   warfarin (COUMADIN ) 5 MG tablet  Takes 5mg  by mouth daily Monday- Saturday, reduce dose to 2.5 mg every sunday     Allergies:   Losartan, Tape, Morphine, Crestor [rosuvastatin], and Lorazepam   ROS:   Please see the history of present illness.    All other systems reviewed and are negative.  EKGs/Labs/Other Studies Reviewed:    The following studies were reviewed today: Cardiac Studies & Procedures   ______________________________________________________________________________________________   STRESS TESTS  MYOCARDIAL PERFUSION IMAGING 06/06/2021  Interpretation Summary   Findings are consistent with prior myocardial infarction. The study is high risk.   No ST deviation was noted.   LV perfusion is abnormal. Defect 1: There is a medium defect with moderate reduction in uptake present in the apical to basal anterolateral location(s) that is fixed.   Left ventricular function is abnormal. Global function is severely reduced. Nuclear stress EF: 38 %. The left ventricular ejection fraction is moderately decreased (30-44%). End diastolic cavity size is severely enlarged. End systolic cavity size is severely enlarged.   Prior study available for comparison from 01/08/2012.  High risk scan due to LvE and EF 38% Diffuse hypokinesis worse in the anterolateral wall Fixed infarct in anterolateral wall from apex to base no ischemia ? ECG shows sinus with very long first degree block   ECHOCARDIOGRAM  ECHOCARDIOGRAM COMPLETE 01/08/2024  Narrative ECHOCARDIOGRAM REPORT    Patient Name:   Robert E Bodin Jr. Date of Exam: 01/08/2024 Medical Rec #:  [id number]           Height:       68.0 in Accession #:    [id number]          Weight:       167.0 lb Date of Birth:  01/26/1946          BSA:          1.893 m Patient Age:    77 years            BP:           142/82 mmHg Patient Gender: M                   HR:           63  bpm. Exam Location:  Outpatient  Procedure: 2D Echo (Both Spectral and Color Flow Doppler were  utilized during procedure).  Indications:    HFrEF (heart failure with reduced ejection fraction) (HCC) [I50.20 (ICD-10-CM)]; Murmur [R01.1 (ICD-10-CM)]  History:        Patient has prior history of Echocardiogram examinations, most recent 09/12/2023. CAD, Prior CABG, Arrythmias:Atrial Fibrillation; Risk Factors:Hypertension, Dyslipidemia, Diabetes and Sleep Apnea. ESRD s/p renal transplant.  Sonographer:    Nolon Berg BA, RDCS Referring  Phys: [id number] EMILY C MONGE  IMPRESSIONS   1. Hypokinesis of the mid/distal lateral, mid/distal anterior, distal inferior and apical walls . Left ventricular ejection fraction, by estimation, is 35 to 40%. Left ventricular ejection fraction by 3D volume is 45 %. The left ventricle has moderately decreased function. There is mild left ventricular hypertrophy. Left ventricular diastolic parameters are consistent with Grade I diastolic dysfunction (impaired relaxation). Elevated left atrial pressure. The average left ventricular global longitudinal strain is -13.9 %. The global longitudinal strain is abnormal. 2. Right ventricular systolic function is normal. The right ventricular size is normal. 3. Left atrial size was mild to moderately dilated. 4. Mild mitral valve regurgitation. 5. AV is thickened, calcified with restricted motion. Peak and mean gradients through the AV are 29 and 17 mm Hg respectively AVA (VTI) is 1.2 cm2. Dimensionless index is 0.36 consistent with moderate AS. COmpared to previous echo from June 2025, mean gradient is increased. . The aortic valve is tricuspid. Aortic valve regurgitation is mild.  FINDINGS Left Ventricle: Hypokinesis of the mid/distal lateral, mid/distal anterior, distal inferior and apical walls. Left ventricular ejection fraction, by estimation, is 35 to 40%. Left ventricular ejection fraction by 3D volume is 45 %. The left ventricle has moderately decreased function. The average left ventricular global longitudinal  strain is -13.9 %. Strain was performed and the global longitudinal strain is abnormal. The left ventricular internal cavity size was normal in size. There is mild left ventricular hypertrophy. Left ventricular diastolic parameters are consistent with Grade I diastolic dysfunction (impaired relaxation). Elevated left atrial pressure.  Right Ventricle: The right ventricular size is normal. Right vetricular wall thickness was not assessed. Right ventricular systolic function is normal.  Left Atrium: Left atrial size was mild to moderately dilated.  Right Atrium: Right atrial size was normal in size.  Pericardium: There is no evidence of pericardial effusion.  Mitral Valve: There is mild thickening of the mitral valve leaflet(s). Mild mitral annular calcification. Mild mitral valve regurgitation.  Tricuspid Valve: The tricuspid valve is normal in structure. Tricuspid valve regurgitation is mild.  Aortic Valve: AV is thickened, calcified with restricted motion. Peak and mean gradients through the AV are 29 and 17 mm Hg respectively AVA (VTI) is 1.2 cm2. Dimensionless index is 0.36 consistent with moderate AS. COmpared to previous echo from June 2025, mean gradient is increased. The aortic valve is tricuspid. Aortic valve regurgitation is mild. Aortic regurgitation PHT measures 667 msec. Aortic valve mean gradient measures 15.8 mmHg. Aortic valve peak gradient measures 26.7 mmHg. Aortic valve area, by VTI measures 1.24 cm.  Pulmonic Valve: The pulmonic valve was normal in structure. Pulmonic valve regurgitation is trivial.  Aorta: The aortic root and ascending aorta are structurally normal, with no evidence of dilitation.  IAS/Shunts: No atrial level shunt detected by color flow Doppler.   LEFT VENTRICLE PLAX 2D LVIDd:         5.10 cm         Diastology LVIDs:         4.20 cm         LV e' medial:    5.55 cm/s LV PW:         1.30 cm         LV E/e' medial:  15.7 LV IVS:        1.10 cm          LV e' lateral:   6.05 cm/s LVOT diam:     2.10 cm  LV E/e' lateral: 14.4 LV SV:         68 LV SV Index:   36              2D Longitudinal LVOT Area:     3.46 cm        Strain LV IVRT:       77 msec         2D Strain GLS   -13.9 % Avg:  3D Volume EF LV 3D EF:    Left ventricul ar ejection fraction by 3D volume is 45 %.  3D Volume EF: 3D EF:        45 % LV EDV:       201 ml LV ESV:       110 ml LV SV:        91 ml  RIGHT VENTRICLE            IVC RV Basal diam:  4.10 cm    IVC diam: 1.90 cm RV Mid diam:    3.60 cm RV S prime:     7.98 cm/s  PULMONARY VEINS TAPSE (M-mode): 2.0 cm     A Reversal Velocity: 18.20 cm/s Diastolic Velocity:  52.90 cm/s S/D Velocity:        1.40 Systolic Velocity:   76.70 cm/s  LEFT ATRIUM             Index        RIGHT ATRIUM           Index LA diam:        4.40 cm 2.32 cm/m   RA Area:     19.60 cm LA Vol (A2C):   82.6 ml 43.64 ml/m  RA Volume:   52.60 ml  27.79 ml/m LA Vol (A4C):   62.6 ml 33.07 ml/m LA Biplane Vol: 74.5 ml 39.36 ml/m AORTIC VALVE AV Area (Vmax):    1.46 cm AV Area (Vmean):   1.28 cm AV Area (VTI):     1.24 cm AV Vmax:           258.20 cm/s AV Vmean:          187.400 cm/s AV VTI:            0.553 m AV Peak Grad:      26.7 mmHg AV Mean Grad:      15.8 mmHg LVOT Vmax:         109.00 cm/s LVOT Vmean:        69.200 cm/s LVOT VTI:          0.197 m LVOT/AV VTI ratio: 0.36 AI PHT:            667 msec  AORTA Ao Root diam: 3.80 cm Ao Asc diam:  3.60 cm  MITRAL VALVE               TRICUSPID VALVE MV Area (PHT): 2.75 cm    TR Peak grad:   24.2 mmHg MV Decel Time: 276 msec    TR Vmax:        246.00 cm/s MV E velocity: 86.97 cm/s MV A velocity: 86.72 cm/s  SHUNTS MV E/A ratio:  1.00        Systemic VTI:  0.20 m Systemic Diam: 2.10 cm  Vina Gull MD Electronically signed by Vina Gull MD Signature Date/Time: 01/08/2024/10:23:24 PM    Final           ______________________________________________________________________________________________      EKG:  EKG Interpretation Date/Time:  Tuesday January 13 2024 16:08:05 EDT Ventricular Rate:  60 PR Interval:  480 QRS Duration:  140 QT Interval:  466 QTC Calculation: 466 R Axis:   -83  Text Interpretation: Sinus rhythm with 1st degree A-V block Right bundle branch block Left anterior fascicular block Bifascicular block Minimal voltage criteria for LVH, may be normal variant ( R in aVL ) When compared with ECG of 27-Oct-2023 11:08, Criteria for Septal infarct are no longer Present Confirmed by Wonda Sharper [phone]) on 01/13/2024 4:26:14 PM    Recent Labs: 04/23/2023: Magnesium  2.2; TSH 2.05 09/11/2023: ALT 51 09/22/2023: Hemoglobin 13.9; Platelets 143 11/19/2023: BNP 208.9; BUN 46; Creatinine, Ser 1.42; Potassium CANCELED; Sodium CANCELED  Recent Lipid Panel    Component Value Date/Time   CHOL 130 04/23/2023 1034   CHOL 158 03/15/2013 0744   TRIG 48 04/23/2023 1034   HDL 64 04/23/2023 1034   HDL 61 03/15/2013 0744   CHOLHDL 2.0 04/23/2023 1034   VLDL 17 09/09/2016 1042   LDLCALC 52 04/23/2023 1034               Physical Exam:    VS:  BP 114/60   Pulse 60   Ht 5' 8 (1.727 m)   Wt 167 lb 6.4 oz (75.9 kg)   SpO2 96%   BMI 25.45 kg/m     Wt Readings from Last 3 Encounters:  01/13/24 167 lb 6.4 oz (75.9 kg)  01/09/24 170 lb (77.1 kg)  11/20/23 167 lb (75.8 kg)     GEN: Pleasant elderly appearing male in no acute distress HEENT: Normal NECK: No JVD; No carotid bruits LYMPHATICS: No lymphadenopathy CARDIAC: RRR, 2/6 early to mid peaking crescendo decrescendo murmur at the right upper sternal border, no diastolic murmur RESPIRATORY:  Clear to auscultation without rales, wheezing or rhonchi  ABDOMEN: Soft, non-tender, non-distended MUSCULOSKELETAL:  No edema; No deformity  SKIN: Warm and dry NEUROLOGIC:  Alert and oriented x 3 PSYCHIATRIC:  Normal affect    Assessment & Plan Nonrheumatic aortic (valve) stenosis The patient has moderate aortic stenosis.  I suspect his symptoms of dyspnea and fatigue are multifactorial.  I personally reviewed his echo images and 2D imaging suggests only mild to moderate aortic stenosis.  His aortic valve in the long axis view actually opens fairly well.  There is thickening and calcification but clearly not severe.  All of the Doppler data suggest moderate aortic stenosis.  I think continued surveillance is appropriate.  I discussed the natural history of aortic stenosis with the patient and his wife today.  They understand that this is a progressive problem that may require intervention over the next several years, but he does not currently meet any indication for aortic valve replacement.  The patient has scheduled follow-up with Dr. Barbaraann and I will see him back as needed.  He should have an annual surveillance echocardiogram and I would be happy to see him back if he progresses into the severe aortic stenosis range in the future. Paroxysmal atrial fibrillation (HCC) Patient anticoagulated with warfarin Essential hypertension Blood pressure well-controlled, treated with valsartan  and furosemide .            Medication Adjustments/Labs and Tests Ordered: Current medicines are reviewed at length with the patient today.  Concerns regarding medicines are outlined above.  Orders Placed This Encounter  Procedures   EKG 12-Lead   No orders of the defined types were placed in this encounter.   Patient Instructions  Medication Instructions:  No medication changes were made at this visit. Continue current regimen.   *If you need a refill on your cardiac medications before your next appointment, please call your pharmacy*  Lab Work: None ordered today. If you have labs (blood work) drawn today and your tests are completely normal, you will receive your results only by: MyChart Message (if you have MyChart)  OR A paper copy in the mail If you have any lab test that is abnormal or we need to change your treatment, we will call you to review the results.  Testing/Procedures: None ordered today.  Follow-Up: At [facility], you and your health needs are our priority.  As part of our continuing mission to provide you with exceptional heart care, our providers are all part of one team.  This team includes your primary Cardiologist (physician) and Advanced Practice Providers or APPs (Physician Assistants and Nurse Practitioners) who all work together to provide you with the care you need, when you need it.  Your next appointment:   02/26/24 at 9:40 AM  Provider:   Dr. Barbaraann      Signed, Ozell Fell, MD  01/14/2024 4:45 PM    [facility]

## 2024-01-13 NOTE — Patient Instructions (Signed)
 Medication Instructions:  No medication changes were made at this visit. Continue current regimen.   *If you need a refill on your cardiac medications before your next appointment, please call your pharmacy*  Lab Work: None ordered today. If you have labs (blood work) drawn today and your tests are completely normal, you will receive your results only by: MyChart Message (if you have MyChart) OR A paper copy in the mail If you have any lab test that is abnormal or we need to change your treatment, we will call you to review the results.  Testing/Procedures: None ordered today.  Follow-Up: At [facility], you and your health needs are our priority.  As part of our continuing mission to provide you with exceptional heart care, our providers are all part of one team.  This team includes your primary Cardiologist (physician) and Advanced Practice Providers or APPs (Physician Assistants and Nurse Practitioners) who all work together to provide you with the care you need, when you need it.  Your next appointment:   02/26/24 at 9:40 AM  Provider:   Dr. Barbaraann

## 2024-01-14 ENCOUNTER — Encounter: Payer: Self-pay | Admitting: Cardiovascular Disease

## 2024-01-14 NOTE — Assessment & Plan Note (Signed)
 Blood pressure well-controlled, treated with valsartan  and furosemide .

## 2024-01-14 NOTE — Assessment & Plan Note (Signed)
 Patient anticoagulated with warfarin

## 2024-01-27 ENCOUNTER — Other Ambulatory Visit (HOSPITAL_COMMUNITY)

## 2024-01-28 ENCOUNTER — Other Ambulatory Visit: Payer: Self-pay

## 2024-01-28 DIAGNOSIS — I5042 Chronic combined systolic (congestive) and diastolic (congestive) heart failure: Secondary | ICD-10-CM

## 2024-01-28 DIAGNOSIS — R0989 Other specified symptoms and signs involving the circulatory and respiratory systems: Secondary | ICD-10-CM

## 2024-01-30 ENCOUNTER — Ambulatory Visit: Admitting: Podiatry

## 2024-01-30 MED ORDER — VALSARTAN 80 MG PO TABS: 80.0000 mg | ORAL_TABLET | Freq: Every day | ORAL | 3 refills | Status: AC

## 2024-02-05 ENCOUNTER — Encounter: Payer: Self-pay | Admitting: Podiatry

## 2024-02-05 ENCOUNTER — Ambulatory Visit (INDEPENDENT_AMBULATORY_CARE_PROVIDER_SITE_OTHER)

## 2024-02-05 ENCOUNTER — Ambulatory Visit: Admitting: Podiatry

## 2024-02-05 ENCOUNTER — Ambulatory Visit (INDEPENDENT_AMBULATORY_CARE_PROVIDER_SITE_OTHER): Admitting: Podiatry

## 2024-02-05 ENCOUNTER — Ambulatory Visit: Payer: Medicare Other | Admitting: Internal Medicine

## 2024-02-05 VITALS — Ht 68.0 in | Wt 167.4 lb

## 2024-02-05 DIAGNOSIS — E559 Vitamin D deficiency, unspecified: Secondary | ICD-10-CM | POA: Diagnosis not present

## 2024-02-05 DIAGNOSIS — S92414A Nondisplaced fracture of proximal phalanx of right great toe, initial encounter for closed fracture: Secondary | ICD-10-CM

## 2024-02-05 DIAGNOSIS — M79671 Pain in right foot: Secondary | ICD-10-CM | POA: Diagnosis not present

## 2024-02-05 NOTE — Patient Instructions (Signed)
 Pt. Robert Arnold 63.09 for pneumatic cast.

## 2024-02-05 NOTE — Progress Notes (Signed)
 Subjective:  Patient ID: Robert FORBES Pama Mickey., male    DOB: [birth year],  MRN: [id number]  Chief Complaint  Patient presents with   Toe Injury    RM 1 Patient is here for injury to right great toe. Pt states jamming toe a week ago. Visible bruising of the right toes (1st-4th). Patient states pain in right great toe for past week.    Discussed the use of AI scribe software for clinical note transcription with the patient, who gave verbal consent to proceed.  History of Present Illness Robert Arnold. is a 78 year old male who presents with a right great toe fracture following a fall.  Approximately ten days ago, he experienced a fall while in [place], tripping over cobblestones. During the fall, he stubbed his right great toe, resulting in bruising and pain. He was taken to the emergency room due to concerns about potential complications from being on blood thinners.  He reports bruising on multiple toes but no pain in the smaller toes. The bruising on his face was initially severe but is now yellowing, indicating healing.  He has not had any previous issues with bone density and takes vitamin D  supplements, although he has not had his levels checked recently. He is on blood thinners.      Objective:    Physical Exam VASCULAR: DP and PT pulse palpable. Foot is warm and well-perfused. Capillary fill time is brisk. DERMATOLOGIC: Normal skin turgor, texture, and temperature. No open lesions, rashes, or ulcerations. NEUROLOGIC: Normal sensation to light touch and pressure. No paresthesias. ORTHOPEDIC: Pain on palpation of right great toe, notably at base of proximal phalanx. Bruising on multiple toes. No instability, no open fracture or lesion, nails not bruised, no subungual hematoma.     No images are attached to the encounter.    Results RADIOLOGY Right foot X-ray: Arthritic changes in less toes, fracture of the right great toe proximal phalanx possible fracture of the fifth  toe (02/05/2024)   Assessment:   1. Closed nondisplaced fracture of proximal phalanx of right great toe, initial encounter   2. Vitamin D  deficiency      Plan:  Patient was evaluated and treated and all questions answered.  Assessment and Plan Assessment & Plan Closed fracture of right great toe Closed fracture of the right great toe, identified as a small crack on the x-ray. The fracture is not severe and does not require surgical intervention. There is a possibility of developing arthritis in the big toe joint in the future, which can be managed with steroid injections if necessary. - Provide a surgical shoe to protect the fracture and aid healing - Provide a splint for the toe to maintain alignment. Toe strapping placed today in clinic to immobilize hallux fracture. - Advise wearing the splint during walking and at night - Allow removal of the splint when resting - Advise walking as tolerated, using pain and swelling as a guide  Contusion of right foot and toes Contusion of the right foot and toes, with bruising present. No instability or open fracture noted. The bruising is resolving, as indicated by the yellowing of the bruise.  Primary osteoarthritis of right foot Arthritic changes noted in the right foot on x-ray. No immediate intervention required, but future management may include steroid injections if arthritis becomes symptomatic.  Vitamin D  deficiency Potential vitamin D  deficiency suggested by low bone density on x-ray. He takes vitamin D  supplements but has not had recent levels checked. Adequate vitamin  D levels are important for bone healing. - Order vitamin D  level test at [facility] - Consider increasing vitamin D  dosage based on test results      Return in about 6 weeks (around 03/18/2024) for fracture follow up (new xrays).

## 2024-02-17 ENCOUNTER — Other Ambulatory Visit: Payer: Self-pay | Admitting: Family Medicine

## 2024-02-17 DIAGNOSIS — R410 Disorientation, unspecified: Secondary | ICD-10-CM

## 2024-02-20 LAB — CALCIUM: Calcium: 9.6 mg/dL (ref 8.6–10.2)

## 2024-02-20 LAB — VITAMIN D 25 HYDROXY (VIT D DEFICIENCY, FRACTURES): Vit D, 25-Hydroxy: 66.9 ng/mL (ref 30.0–100.0)

## 2024-02-25 NOTE — Progress Notes (Unsigned)
 Cardiology Office Note:  .   Date:  02/26/2024  ID:  Robert Arnold., DOB 02/12/1946, MRN [id number] PCP: Chrystal Lamarr RAMAN, MD  [facility] Providers Cardiologist:  Aleene Passe, MD (Inactive) {   History of Present Illness: .    Chief Complaint  Patient presents with   Follow-up         Robert Arnold. is a 78 y.o. male with below history who presents for follow-up.    History of Present Illness   Robert Arnold. is a 78 year old male with systolic heart failure, coronary artery disease, and paroxysmal atrial fibrillation who presents for follow-up. He is accompanied by his wife. He was previously seeing Dr. Robby, who has since retired.  He has a history of systolic heart failure with a recent echocardiogram showing an ejection fraction (EF) of 35-40%, decreased from previous values of 45-50%. He experiences low energy levels and is not currently on any heart failure medications. He takes valsartan  40 mg daily, which was reduced due to dizziness and shortness of breath, and furosemide  60 mg daily, which he feels helps with his breathing. No chest pain is reported, but he experiences shortness of breath and low energy.  He has coronary artery disease and underwent coronary artery bypass grafting in November 2013. A stent was placed prior to the surgery, and no further stents or bypass surgeries have been performed since.  He has paroxysmal atrial fibrillation and is on Coumadin  for anticoagulation. He experiences episodes of atrial fibrillation that are not prolonged . He is not on a pacemaker. He cannot afford Eliquis, which is expected to go generic next year.  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 His creatinine is 1.57.  He has cirrhosis, recently diagnosed as non-alcoholic fatty liver disease. He is diabetic with an A1c of 6.6, indicating good control. He also has high blood pressure, which is treated. His current  medications include Lipitor for cholesterol management, with an LDL of 52, and aspirin  81 mg daily.  He is not currently exercising due to a broken big toe sustained three to four weeks ago but plans to resume walking once his toe heals. He denies having sleep apnea and does not snore. He experiences dizziness and equilibrium issues, particularly when standing up quickly.          Problem List CAD -PCI LAD 09/2011 -CABG x 4 2013 [facility] in [place]) 2. Systolic HF -EF 35-40% 01/2024 -EF 45-50% 04/2021 3. Paroxysmal Afib  4. HTN 5. ESRD/Renal Tx (2013) -IgA nephropathy  -CKD 3a 6. DM -A1c 6.6 7. HLD -T chol 130, HDL 64, LDL 52, TG 48 8. Trifascicular Block 9. Cirrhosis  10. Moderate aortic stenosis     ROS: All other ROS reviewed and negative. Pertinent positives noted in the HPI.     Studies Reviewed: SABRA       TTE 01/08/2024  1. Hypokinesis of the mid/distal lateral, mid/distal anterior, distal  inferior and apical walls . Left ventricular ejection fraction, by  estimation, is 35 to 40%. Left ventricular ejection fraction by 3D volume  is 45 %. The left ventricle has moderately   decreased function. There is mild left ventricular hypertrophy. Left  ventricular diastolic parameters are consistent with Grade I diastolic  dysfunction (impaired relaxation). Elevated left atrial pressure. The  average left ventricular global  longitudinal strain is -13.9 %. The global longitudinal strain is  abnormal.   2. Right  ventricular systolic function is normal. The right ventricular  size is normal.   3. Left atrial size was mild to moderately dilated.   4. Mild mitral valve regurgitation.   5. AV is thickened, calcified with restricted motion. Peak and mean  gradients through the AV are 29 and 17 mm Hg respectively AVA (VTI) is 1.2  cm2. Dimensionless index is 0.36 consistent with moderate AS. COmpared to  previous echo from June 2025, mean  gradient is increased. . The aortic valve  is tricuspid. Aortic valve  regurgitation is mild.  Physical Exam:   VS:  BP 108/69   Pulse 64   Ht 5' 8 (1.727 m)   Wt 169 lb (76.7 kg)   SpO2 94%   BMI 25.70 kg/m    Wt Readings from Last 3 Encounters:  02/26/24 169 lb (76.7 kg)  02/05/24 167 lb 6.4 oz (75.9 kg)  01/13/24 167 lb 6.4 oz (75.9 kg)    GEN: Well nourished, well developed in no acute distress NECK: No JVD; No carotid bruits CARDIAC: RRR, 3/6 SEM, rubs, gallops RESPIRATORY:  Clear to auscultation without rales, wheezing or rhonchi  ABDOMEN: Soft, non-tender, non-distended EXTREMITIES:  No edema; No deformity  ASSESSMENT AND PLAN: .   Assessment and Plan    Chronic systolic heart failure with reduced ejection fraction Ejection fraction decreased to 35-40%. Symptoms include fatigue and low energy. Beta blockers not used due to trifascicular block. - Continue valsartan  40 mg daily. - Continue furosemide  60 mg daily. - Encouraged exercise as tolerated. - Ordered stress test to assess cardiac perfusion. - Favor medical therapy given renal Tx and now cirrhosis - Valve may be contributing  Paroxysmal atrial fibrillation on anticoagulation therapy Managed with Coumadin . Aspirin  discontinued due to concurrent use. Eliquis planned when generic. - Discontinued aspirin . - Continue Coumadin .  Atherosclerotic heart disease status post coronary artery bypass grafting Status post CABG in November 2013. No current angina. Lipitor maintaining LDL at goal. - Continue Lipitor 40 mg daily.  Moderate nonrheumatic aortic stenosis Moderate stenosis potentially affecting ejection fraction. Evaluated by structural heart team. - Continue surveillance of aortic stenosis.  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 Monitoring required due to potential fluid retention and heart failure. - Continue monitoring kidney function.  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 - Continue current management.  Essential hypertension Managed with valsartan . Blood pressure low, causing dizziness and lightheadedness. - Continue valsartan  40 mg daily.  Mixed hyperlipidemia LDL at goal with Lipitor. - Continue Lipitor 40 mg daily.  Shortness of breath Potentially related to heart failure and fluid retention. - Continue furosemide  60 mg daily. - Encouraged exercise as tolerated.  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 Limits use of beta blockers. - Continue monitoring trifascicular block.          Informed Consent   Shared Decision Making/Informed Consent The risks [chest pain, shortness of breath, cardiac arrhythmias, dizziness, blood pressure fluctuations, myocardial infarction, stroke/transient ischemic attack, nausea, vomiting, allergic reaction, radiation exposure, metallic taste sensation and life-threatening complications (estimated to be 1 in 10,000)], benefits (risk stratification, diagnosing coronary artery disease, treatment guidance) and alternatives of a nuclear stress test were discussed in detail with Mr. Bahl and he agrees to proceed.      Follow-up: Return in about 6 months (around 08/25/2024).  Signed, Darryle DASEN. Barbaraann, MD, [facility] [place]  [facility]  [phone] [place]  [address] [place], [place] [address] [phone]  9:57 AM

## 2024-02-26 ENCOUNTER — Encounter: Payer: Self-pay | Admitting: Cardiovascular Disease

## 2024-02-26 ENCOUNTER — Other Ambulatory Visit: Payer: Self-pay | Admitting: Cardiovascular Disease

## 2024-02-26 ENCOUNTER — Ambulatory Visit: Attending: Cardiovascular Disease | Admitting: Cardiovascular Disease

## 2024-02-26 VITALS — BP 108/69 | HR 64 | Ht 68.0 in | Wt 169.0 lb

## 2024-02-26 DIAGNOSIS — I25810 Atherosclerosis of coronary artery bypass graft(s) without angina pectoris: Secondary | ICD-10-CM | POA: Insufficient documentation

## 2024-02-26 DIAGNOSIS — I5042 Chronic combined systolic (congestive) and diastolic (congestive) heart failure: Secondary | ICD-10-CM | POA: Diagnosis not present

## 2024-02-26 DIAGNOSIS — I480 Paroxysmal atrial fibrillation: Secondary | ICD-10-CM | POA: Diagnosis present

## 2024-02-26 DIAGNOSIS — I10 Essential (primary) hypertension: Secondary | ICD-10-CM | POA: Diagnosis not present

## 2024-02-26 DIAGNOSIS — R0602 Shortness of breath: Secondary | ICD-10-CM

## 2024-02-26 DIAGNOSIS — I350 Nonrheumatic aortic (valve) stenosis: Secondary | ICD-10-CM | POA: Diagnosis not present

## 2024-02-26 DIAGNOSIS — E782 Mixed hyperlipidemia: Secondary | ICD-10-CM

## 2024-02-26 NOTE — Patient Instructions (Signed)
 Medication Instructions:  STOP Aspirin   *If you need a refill on your cardiac medications before your next appointment, please call your pharmacy*  Lab Work: None ordered If you have labs (blood work) drawn today and your tests are completely normal, you will receive your results only by: MyChart Message (if you have MyChart) OR A paper copy in the mail If you have any lab test that is abnormal or we need to change your treatment, we will call you to review the results.  Testing/Procedures: Your physician has requested that you have a lexiscan  myoview . For further information please visit [email or web address] Please follow instruction sheet, as given.   Follow-Up: At [facility], you and your health needs are our priority.  As part of our continuing mission to provide you with exceptional heart care, our providers are all part of one team.  This team includes your primary Cardiologist (physician) and Advanced Practice Providers or APPs (Physician Assistants and Nurse Practitioners) who all work together to provide you with the care you need, when you need it.  Your next appointment:   6 month(s)  Provider:   Darryle Decent, MD   We recommend signing up for the patient portal called MyChart.  Sign up information is provided on this After Visit Summary.  MyChart is used to connect with patients for Virtual Visits (Telemedicine).  Patients are able to view lab/test results, encounter notes, upcoming appointments, etc.  Non-urgent messages can be sent to your provider as well.   To learn more about what you can do with MyChart, go to [email or web address].   Other Instructions

## 2024-03-01 ENCOUNTER — Telehealth (HOSPITAL_COMMUNITY): Payer: Self-pay

## 2024-03-01 NOTE — Telephone Encounter (Signed)
 Detailed instructions left on his answering machine. S.Rayson Rando CCT

## 2024-03-01 NOTE — Telephone Encounter (Signed)
 Patient called back for his instructions for his stress test. Instructions given. S.Adrionna Delcid CCT

## 2024-03-02 ENCOUNTER — Inpatient Hospital Stay (HOSPITAL_COMMUNITY): Admission: RE | Admit: 2024-03-02 | Source: Ambulatory Visit

## 2024-03-02 ENCOUNTER — Ambulatory Visit (HOSPITAL_COMMUNITY)
Admission: RE | Admit: 2024-03-02 | Discharge: 2024-03-02 | Disposition: A | Discharge status: Home / Self Care | Source: Ambulatory Visit | Attending: Internal Medicine | Admitting: Internal Medicine

## 2024-03-02 DIAGNOSIS — R0602 Shortness of breath: Secondary | ICD-10-CM | POA: Insufficient documentation

## 2024-03-02 DIAGNOSIS — I350 Nonrheumatic aortic (valve) stenosis: Secondary | ICD-10-CM | POA: Diagnosis present

## 2024-03-02 DIAGNOSIS — I10 Essential (primary) hypertension: Secondary | ICD-10-CM | POA: Insufficient documentation

## 2024-03-02 DIAGNOSIS — I480 Paroxysmal atrial fibrillation: Secondary | ICD-10-CM | POA: Diagnosis present

## 2024-03-02 DIAGNOSIS — E782 Mixed hyperlipidemia: Secondary | ICD-10-CM | POA: Diagnosis present

## 2024-03-02 DIAGNOSIS — I25810 Atherosclerosis of coronary artery bypass graft(s) without angina pectoris: Secondary | ICD-10-CM | POA: Diagnosis present

## 2024-03-02 DIAGNOSIS — I5042 Chronic combined systolic (congestive) and diastolic (congestive) heart failure: Secondary | ICD-10-CM | POA: Diagnosis not present

## 2024-03-02 LAB — MYOCARDIAL PERFUSION IMAGING
Base ST Depression (mm): 0 mm
LV dias vol: 252 mL (ref 62–150)
LV sys vol: 117 mL (ref 4.2–5.8)
Nuc Stress EF: 54 %
Peak HR: 69 {beats}/min
Rest HR: 52 {beats}/min
Rest Nuclear Isotope Dose: 8.3 mCi
SDS: 2
SRS: 23
SSS: 23
ST Depression (mm): 0 mm
Stress Nuclear Isotope Dose: 27 mCi
TID: 0.9

## 2024-03-02 MED ORDER — TECHNETIUM TC 99M TETROFOSMIN IV KIT
8.3000 | PACK | Freq: Once | INTRAVENOUS | Status: AC | PRN
  Administered 2024-03-02: 8.3 via INTRAVENOUS

## 2024-03-02 MED ORDER — TECHNETIUM TC 99M TETROFOSMIN IV KIT
27.0000 | PACK | Freq: Once | INTRAVENOUS | Status: AC | PRN
  Administered 2024-03-02: 27 via INTRAVENOUS

## 2024-03-02 MED ORDER — REGADENOSON 0.4 MG/5ML IV SOLN
INTRAVENOUS | Status: AC
  Filled 2024-03-02: qty 5

## 2024-03-02 MED ORDER — REGADENOSON 0.4 MG/5ML IV SOLN
0.4000 mg | Freq: Once | INTRAVENOUS | Status: AC
  Administered 2024-03-02: 0.4 mg via INTRAVENOUS

## 2024-03-04 ENCOUNTER — Ambulatory Visit: Payer: Self-pay | Admitting: Cardiovascular Disease

## 2024-03-08 ENCOUNTER — Ambulatory Visit: Payer: Medicare Other | Admitting: Nurse Practitioner

## 2024-03-18 ENCOUNTER — Ambulatory Visit: Admitting: Podiatry

## 2024-03-24 ENCOUNTER — Other Ambulatory Visit: Payer: Self-pay | Admitting: Nurse Practitioner

## 2024-03-24 NOTE — Progress Notes (Signed)
 [facility]  Urgent Care Provider Note   Provider at bedside: 12:18 PM  History obtained from the: Patient  HISTORY   PATIENT ID: Robert Arnold is a 78 y.o. male.  CHIEF COMPLAINT: Chief Complaint  Patient presents with   Laceration    Accidentally sliced on index finger.      ALLERGIES: Allergies[1]   PAST MEDICAL HISTORY: PMH - A-fib    (CMD) High cholesterol Hypertension Kidney transplanted (CMD)  CURRENT MEDICATIONS: Current Medications[2]  ROS  All other symptoms are reviewed and are negative except those listed in HPI   HPI   Robert Arnold is a 78 y.o. male  presents to Urgent care   History of Present Illness   PHYSICAL EXAM   Vitals:   03/24/24 1207  BP: 122/69  Pulse: 67  Resp: 20  Temp: 97.7 F (36.5 C)  TempSrc: Tympanic  SpO2: 98%  Weight: 74.8 kg (165 lb)  Height: 1.727 m (5' 8)     Physical Exam Vitals and nursing note reviewed.  Constitutional:      General: He is not in acute distress.    Appearance: Normal appearance. He is not ill-appearing, toxic-appearing or diaphoretic.  HENT:     Head: Normocephalic and atraumatic.     Right Ear: External ear normal.     Left Ear: External ear normal.     Nose: Nose normal.     Mouth/Throat:     Mouth: Mucous membranes are moist.  Cardiovascular:     Rate and Rhythm: Normal rate.  Pulmonary:     Effort: Pulmonary effort is normal. No respiratory distress.  Skin:    General: Skin is warm and dry.     Capillary Refill: Capillary refill takes less than 2 seconds.     Comments: 1 cm lac between index finger PIP and mcp dorsal aspect- no active bleeding no evidence of FB or infection full range of motion.  No evidence of tendon injury  Neurological:     General: No focal deficit present.     Mental Status: He is alert and oriented to person, place, and time.      Laceration repair: L index finger  Date/Time: 03/24/2024 12:30 PM  Performed by: Channing Macario Sero, FNP Authorized by: Channing Macario Sero, FNP   Consent:    Consent obtained:  Verbal   Consent given by:  Patient   Risks, benefits, and alternatives were discussed: yes     Risks discussed:  Infection, pain, retained foreign body, poor cosmetic result and poor wound healing   Alternatives discussed:  No treatment Universal protocol:    Procedure explained and questions answered to patient or proxy's satisfaction: yes     Patient identity confirmed:  Verbally with patient Anesthesia:    Anesthesia method:  None Laceration details:    Location:  Finger   Finger location:  L index finger   Length (cm):  1   Depth (mm):  1 Exploration:    Imaging obtained: bedside ultrasound     Contaminated: no   Treatment:    Area cleansed with:  Chlorhexidine    Amount of cleaning:  Standard   Irrigation solution:  Sterile saline   Irrigation method:  Tap   Visualized foreign bodies/material removed: no   Skin repair:    Repair method:  Tissue adhesive Approximation:    Approximation:  Close Repair type:    Repair type:  Simple Post-procedure details:    Dressing:  Adhesive  bandage   Procedure completion:  Tolerated well, no immediate complications     RESULTS  No results found for this visit on 03/24/24.   ASSESSMENT/PLAN/MDM  No diagnosis found.   Robert Arnold is a 78 y.o. male  presents to Urgent care as post laceration to left index finger while trying to cut a bagel this morning, patient on blood thinners.  On exam patient appears in no acute distress.  Vital signs are unremarkable for tachycardia or fever.  Skin evaluation shows1 cm lac between index finger PIP and mcp dorsal aspect- no active bleeding no evidence of FB or infection full range of motion.  No evidence of tendon injury.  I discussed the risk-benefit advantages disadvantages of treatment of wound adhesive versus sutures.  Shared decision making concluded Dermabond repair.  Patient will take Motrin or Tylenol  for  pain as needed wound closure was successful.  A watch for signs of infection return for any new or worsening symptoms.  Tetanus is current.      UC DISPOSITION   Follow up with PCP  Patient Instructions  Keep clean and dry for the next 7 to 10 days until wound is well-healed.  Watch for signs of infection.  Take Tylenol  for pain as needed   Hand out provided, I discussed the findings today, diagnosis/differential diagnosis, plan and red flags that require return for reevaluation with PCP,  Urgent care or EMERGENCY. Patient/representitive was agreeable to outlined plan. Questions were answered and patient is stable for discharge.  Provider time spent in patient care today, inclusive of but not limited to clinical reassessment, review of diagnostic studies, and discharge preparation, was less than 30 minutes.  This document was created using the aid of voice recognition Scientist, clinical (histocompatibility and immunogenetics).  Electrically signed by Channing Sero ENP-C MSN at 1:30 PM        [1] Allergies Allergen Reactions   Losartan Anaphylaxis and Other (See Comments)   Lorazepam     Pt is unsure of reaction   Morphine  Other (See Comments)    Has no effect on pt   Rosuvastatin Other (See Comments)    Elevated LFT's  [2]   allopurinoL  (ZYLOPRIM ) 300 mg tablet   brimonidine  (ALPHAGAN ) 0.2 % ophthalmic solution   buPROPion  (WELLBUTRIN  XL) 300 mg 24 hr tablet   finasteride  (PROSCAR ) 5 mg tablet   latanoprost  (XALATAN ) 0.005 % ophthalmic solution   modafiniL  (PROVIGIL ) 200 mg tablet   mycophenolate  (MYFORTIC ) 360 mg TbEC DR tablet   tacrolimus  (PROGRAF ) 1 mg capsule   tamsulosin  (FLOMAX ) 0.4 mg cap   traZODone  (DESYREL ) 50 mg tablet   valsartan  (DIOVAN ) 80 mg tablet   warfarin (COUMADIN ) 1 mg tablet   allopurinoL  (ZYLOPRIM ) 300 mg tablet   ascorbic acid  (VITAMIN C ) 1,000 mg tablet   aspirin  81 mg EC tablet   atorvastatin  (LIPITOR) 40 mg tablet   buPROPion  (WELLBUTRIN  XL) 300 mg 24  hr tablet   cinacalcet (Sensipar) 30 mg tablet   cinacalcet (SENSIPAR) 30 mg tablet   citalopram  (CeleXA ) 40 mg tablet   dapagliflozin  propanediol (FARXIGA ) 10 mg tab tablet   epoetin alfa (Epogen) 2,000 unit/mL injection   epoetin alfa (EPOGEN) 2,000 unit/mL injection   fenofibrate  micronized (LOFIBRA) 134 mg capsule   fenofibrate  micronized (LOFIBRA) 134 mg capsule   finasteride  (PROSCAR ) 5 mg tablet   gabapentin  (NEURONTIN ) 300 mg capsule   glyBURIDE (DIABETA) 1.25 mg tablet   glyBURIDE (DIABETA) 1.25 mg tablet   lisdexamfetamine  (VYVANSE ) 60 mg capsule  pseudoePHEDrine  (SUDAFED) 120 mg 12 hr tablet   rosuvastatin (CRESTOR) 40 mg tablet   sevelamer carbonate (RENVELA) 800 mg tablet   SITagliptin phosphate (JANUVIA) 100 mg tablet   spironolactone  (ALDACTONE ) 25 mg tablet   tacrolimus  (PROGRAF ) 0.5 mg capsule   tacrolimus  (PROGRAF ) 1 mg capsule   tamsulosin  (FLOMAX ) 0.4 mg cap   triamcinolone  acetonide (KENALOG ) 0.1 % cream   warfarin (COUMADIN ) 5 mg tablet

## 2024-03-28 ENCOUNTER — Other Ambulatory Visit

## 2024-04-06 ENCOUNTER — Ambulatory Visit: Admitting: Podiatry

## 2024-04-08 ENCOUNTER — Other Ambulatory Visit: Payer: Self-pay | Admitting: Orthopedic Surgery

## 2024-04-08 DIAGNOSIS — N138 Other obstructive and reflux uropathy: Secondary | ICD-10-CM

## 2024-04-12 ENCOUNTER — Ambulatory Visit: Payer: Medicare Other | Admitting: Nurse Practitioner

## 2024-04-15 ENCOUNTER — Ambulatory Visit

## 2024-04-15 ENCOUNTER — Ambulatory Visit: Admitting: Podiatry

## 2024-04-15 VITALS — Ht 68.0 in | Wt 169.0 lb

## 2024-04-15 DIAGNOSIS — M2042 Other hammer toe(s) (acquired), left foot: Secondary | ICD-10-CM

## 2024-04-15 DIAGNOSIS — S92414A Nondisplaced fracture of proximal phalanx of right great toe, initial encounter for closed fracture: Secondary | ICD-10-CM | POA: Diagnosis not present

## 2024-04-15 DIAGNOSIS — M79672 Pain in left foot: Secondary | ICD-10-CM

## 2024-04-15 NOTE — Patient Instructions (Signed)
 More silicone pads can be purchased from:  [email or web address]

## 2024-04-17 ENCOUNTER — Encounter: Payer: Self-pay | Admitting: Podiatry

## 2024-04-17 NOTE — Progress Notes (Signed)
"  °  Subjective:  Patient ID: Robert FORBES Pama Mickey., male    DOB: [birth year],  MRN: [id number]  Chief Complaint  Patient presents with   Fracture    RM 21 Patient is here to f/u on fracture of right great toe and emergent left foot pain. Pt states minor pain in right great toe and a sore on the left 3rd toe.    Discussed the use of AI scribe software for clinical note transcription with the patient, who gave verbal consent to proceed.  History of Present Illness Robert Lo. is a 79 year old male who presents with a right great toe fracture following a fall.  He is here today for follow-up visit and notes quite a bit of improvement.  He is having very little pain in the toe itself.  Does have a new issue on the left third toe      Objective:    Physical Exam VASCULAR: DP and PT pulse palpable. Foot is warm and well-perfused. Capillary fill time is brisk. DERMATOLOGIC: Normal skin turgor, texture, and temperature. No open lesions, rashes, or ulcerations. NEUROLOGIC: Normal sensation to light touch and pressure. No paresthesias. ORTHOPEDIC: No pain to palpation right hallux.  Good range of motion of MTP joint is pain-free.  Semirigid hammertoe left foot with dorsal irritation of skin and on PIPJ, no ulceration   No images are attached to the encounter.    Results RADIOLOGY Right foot X-ray: Good consolidation of right hallux fifth proximal phalanx fracture   Assessment:   1. Closed nondisplaced fracture of proximal phalanx of right great toe, initial encounter   2. Hammertoe of left foot      Plan:  Patient was evaluated and treated and all questions answered.  Assessment and Plan Assessment & Plan Closed fracture of right great toe Doing much better shoe gear and activity as tolerated.  Discussed long-term arthritis that could develop in the toe.  He currently has good range of motion in the joint.  Follow-up with me as needed for this.  Hammertoe of left  foot Discussed surgical nonsurgical treatment options were hammertoe deformity recommend nonsurgical treatment option with a silicone offloading pad discussed appropriate shoe gear.  Follow-up in as needed for this.      Return if symptoms worsen or fail to improve.   "

## 2024-04-20 ENCOUNTER — Encounter: Payer: Self-pay | Admitting: Cardiovascular Disease

## 2024-04-21 ENCOUNTER — Telehealth: Payer: Self-pay

## 2024-04-21 DIAGNOSIS — K7460 Unspecified cirrhosis of liver: Secondary | ICD-10-CM

## 2024-04-21 NOTE — Telephone Encounter (Signed)
 Order placed for RUQ ultrasound.  Scheduled at [facility] on Monday, 1-26 at 9:30am to arr at 9:15am and NPO 6 hours.  MyChart message to patient with appointment info. Number  given to reschedule if necessary

## 2024-04-21 NOTE — Telephone Encounter (Signed)
-----   Message from [facility] [place] [facility] sent at 11/05/2023  1:55 PM EDT ----- Regarding: ruq U/S due in Jan RUQ US  in 6 months. (Jan 2026) for hcc screening

## 2024-04-29 ENCOUNTER — Encounter: Payer: Self-pay | Admitting: Podiatry

## 2024-04-29 ENCOUNTER — Ambulatory Visit

## 2024-04-29 DIAGNOSIS — M7752 Other enthesopathy of left foot: Secondary | ICD-10-CM | POA: Diagnosis not present

## 2024-04-29 DIAGNOSIS — D689 Coagulation defect, unspecified: Secondary | ICD-10-CM

## 2024-04-29 DIAGNOSIS — L84 Corns and callosities: Secondary | ICD-10-CM

## 2024-04-29 DIAGNOSIS — M7750 Other enthesopathy of unspecified foot: Secondary | ICD-10-CM

## 2024-04-29 MED ORDER — TRIAMCINOLONE ACETONIDE 10 MG/ML IJ SUSP
10.0000 mg | Freq: Once | INTRAMUSCULAR | Status: AC
  Administered 2024-04-29: 10 mg via INTRA_ARTICULAR

## 2024-05-03 ENCOUNTER — Ambulatory Visit (HOSPITAL_COMMUNITY): Admission: RE | Admit: 2024-05-03 | Source: Ambulatory Visit

## 2024-05-04 ENCOUNTER — Ambulatory Visit: Admitting: Podiatry

## 2024-05-04 NOTE — Progress Notes (Signed)
 Subjective:   Patient ID: Robert FORBES Pama Mickey., male   DOB: 79 y.o.   MRN: [id number]   HPI Patient presents with a painful inflammation between the 2nd and 3rd digit left on the third toe at the inner phalangeal joint   ROS      Objective:  Physical Exam  Patient neurovascular status intact inflammation pain third digit left at the inner phalangeal joint localized fluid buildup     Assessment:  Inflammatory capsulitis of the digit left with pain     Plan:  H&P reviewed this may require bone resection but at this time sterile prep carefully did a interphalange Il injection 2 mg dexamethasone  Kenalog  debrided  tissue on the adjacent digits with patient at high risk due to being on blood thinner applied sterile dressing and reappoint to recheck  as symptoms indicate  X-rays indicate that there is mild increase in the size of bone structure consistent with the lesion and fluid buildup

## 2024-05-13 ENCOUNTER — Ambulatory Visit (HOSPITAL_COMMUNITY)
Admission: RE | Admit: 2024-05-13 | Discharge: 2024-05-13 | Disposition: A | Source: Ambulatory Visit | Attending: Gastroenterology

## 2024-05-13 DIAGNOSIS — K7460 Unspecified cirrhosis of liver: Secondary | ICD-10-CM

## 2024-05-17 ENCOUNTER — Ambulatory Visit: Payer: Medicare Other | Admitting: Internal Medicine

## 2024-09-01 ENCOUNTER — Ambulatory Visit: Admitting: Cardiovascular Disease
